# Patient Record
Sex: Female | Born: 1970 | Race: Black or African American | Hispanic: No | Marital: Married | Smoking: Never smoker
Health system: Southern US, Community
[De-identification: ages and names within clinical notes are randomized; demographics above are authoritative.]

## PROBLEM LIST (undated history)

## (undated) DIAGNOSIS — C801 Malignant (primary) neoplasm, unspecified: Secondary | ICD-10-CM

## (undated) DIAGNOSIS — R053 Chronic cough: Secondary | ICD-10-CM

## (undated) DIAGNOSIS — R05 Cough: Secondary | ICD-10-CM

## (undated) DIAGNOSIS — K589 Irritable bowel syndrome without diarrhea: Secondary | ICD-10-CM

## (undated) DIAGNOSIS — Z803 Family history of malignant neoplasm of breast: Secondary | ICD-10-CM

## (undated) DIAGNOSIS — Z01419 Encounter for gynecological examination (general) (routine) without abnormal findings: Secondary | ICD-10-CM

## (undated) DIAGNOSIS — K219 Gastro-esophageal reflux disease without esophagitis: Secondary | ICD-10-CM

## (undated) DIAGNOSIS — E785 Hyperlipidemia, unspecified: Secondary | ICD-10-CM

## (undated) HISTORY — DX: Irritable bowel syndrome, unspecified: K58.9

## (undated) HISTORY — DX: Hyperlipidemia, unspecified: E78.5

## (undated) HISTORY — PX: ROOT CANAL: SHX2363

## (undated) HISTORY — DX: Chronic cough: R05.3

## (undated) HISTORY — DX: Cough: R05

## (undated) HISTORY — DX: Gastro-esophageal reflux disease without esophagitis: K21.9

## (undated) HISTORY — DX: Family history of malignant neoplasm of breast: Z80.3

## (undated) HISTORY — DX: Encounter for gynecological examination (general) (routine) without abnormal findings: Z01.419

---

## 1998-09-10 ENCOUNTER — Emergency Department (HOSPITAL_COMMUNITY): Admission: EM | Admit: 1998-09-10 | Discharge: 1998-09-10 | Payer: Self-pay | Admitting: Emergency Medicine

## 1998-09-10 ENCOUNTER — Encounter: Payer: Self-pay | Admitting: Emergency Medicine

## 1999-09-19 ENCOUNTER — Emergency Department (HOSPITAL_COMMUNITY): Admission: EM | Admit: 1999-09-19 | Discharge: 1999-09-19 | Payer: Self-pay | Admitting: Podiatry

## 1999-09-23 ENCOUNTER — Inpatient Hospital Stay (HOSPITAL_COMMUNITY): Admission: EM | Admit: 1999-09-23 | Discharge: 1999-09-29 | Payer: Self-pay | Admitting: *Deleted

## 1999-09-23 ENCOUNTER — Encounter: Payer: Self-pay | Admitting: Emergency Medicine

## 1999-09-30 ENCOUNTER — Other Ambulatory Visit: Admission: RE | Admit: 1999-09-30 | Discharge: 1999-10-07 | Payer: Self-pay

## 1999-11-04 ENCOUNTER — Other Ambulatory Visit: Admission: RE | Admit: 1999-11-04 | Discharge: 1999-11-04 | Payer: Self-pay | Admitting: Obstetrics and Gynecology

## 2001-02-21 ENCOUNTER — Other Ambulatory Visit: Admission: RE | Admit: 2001-02-21 | Discharge: 2001-02-21 | Payer: Self-pay | Admitting: Obstetrics and Gynecology

## 2002-04-25 ENCOUNTER — Ambulatory Visit (HOSPITAL_COMMUNITY): Admission: RE | Admit: 2002-04-25 | Discharge: 2002-04-25 | Payer: Self-pay

## 2002-05-08 ENCOUNTER — Ambulatory Visit (HOSPITAL_COMMUNITY): Admission: RE | Admit: 2002-05-08 | Discharge: 2002-05-08 | Payer: Self-pay | Admitting: Obstetrics and Gynecology

## 2002-05-08 ENCOUNTER — Encounter: Payer: Self-pay | Admitting: Obstetrics and Gynecology

## 2002-08-28 ENCOUNTER — Encounter: Payer: Self-pay | Admitting: Internal Medicine

## 2003-09-01 ENCOUNTER — Other Ambulatory Visit: Admission: RE | Admit: 2003-09-01 | Discharge: 2003-09-01 | Payer: Self-pay | Admitting: Obstetrics and Gynecology

## 2004-08-09 ENCOUNTER — Ambulatory Visit: Payer: Self-pay | Admitting: Family Medicine

## 2005-01-24 ENCOUNTER — Other Ambulatory Visit: Admission: RE | Admit: 2005-01-24 | Discharge: 2005-01-24 | Payer: Self-pay | Admitting: Obstetrics and Gynecology

## 2005-04-24 ENCOUNTER — Ambulatory Visit: Payer: Self-pay | Admitting: Internal Medicine

## 2005-06-27 ENCOUNTER — Ambulatory Visit: Payer: Self-pay | Admitting: Internal Medicine

## 2007-04-05 ENCOUNTER — Ambulatory Visit: Payer: Self-pay | Admitting: Family Medicine

## 2007-10-04 ENCOUNTER — Encounter: Payer: Self-pay | Admitting: Family Medicine

## 2007-10-14 ENCOUNTER — Ambulatory Visit: Payer: Self-pay | Admitting: Family Medicine

## 2007-10-14 DIAGNOSIS — K589 Irritable bowel syndrome without diarrhea: Secondary | ICD-10-CM | POA: Insufficient documentation

## 2007-10-14 DIAGNOSIS — K219 Gastro-esophageal reflux disease without esophagitis: Secondary | ICD-10-CM | POA: Insufficient documentation

## 2007-10-18 ENCOUNTER — Encounter: Payer: Self-pay | Admitting: Family Medicine

## 2007-10-18 ENCOUNTER — Telehealth: Payer: Self-pay | Admitting: Family Medicine

## 2007-10-22 ENCOUNTER — Telehealth: Payer: Self-pay | Admitting: Family Medicine

## 2007-10-28 ENCOUNTER — Telehealth: Payer: Self-pay | Admitting: Family Medicine

## 2007-12-03 ENCOUNTER — Ambulatory Visit: Payer: Self-pay | Admitting: Family Medicine

## 2007-12-11 ENCOUNTER — Telehealth: Payer: Self-pay | Admitting: Family Medicine

## 2008-01-20 ENCOUNTER — Ambulatory Visit: Payer: Self-pay | Admitting: Family Medicine

## 2008-01-21 ENCOUNTER — Telehealth: Payer: Self-pay | Admitting: Family Medicine

## 2008-01-24 ENCOUNTER — Telehealth: Payer: Self-pay | Admitting: Family Medicine

## 2008-01-27 ENCOUNTER — Telehealth: Payer: Self-pay | Admitting: Family Medicine

## 2008-02-26 ENCOUNTER — Ambulatory Visit: Payer: Self-pay | Admitting: Family Medicine

## 2008-04-16 ENCOUNTER — Telehealth: Payer: Self-pay | Admitting: Internal Medicine

## 2008-04-28 ENCOUNTER — Telehealth: Payer: Self-pay | Admitting: Family Medicine

## 2008-05-06 ENCOUNTER — Telehealth (INDEPENDENT_AMBULATORY_CARE_PROVIDER_SITE_OTHER): Payer: Self-pay | Admitting: *Deleted

## 2008-05-06 ENCOUNTER — Ambulatory Visit: Payer: Self-pay | Admitting: Family Medicine

## 2008-05-29 ENCOUNTER — Telehealth: Payer: Self-pay | Admitting: Family Medicine

## 2008-06-03 ENCOUNTER — Ambulatory Visit: Payer: Self-pay | Admitting: Pulmonary Disease

## 2008-06-04 ENCOUNTER — Telehealth (INDEPENDENT_AMBULATORY_CARE_PROVIDER_SITE_OTHER): Payer: Self-pay | Admitting: *Deleted

## 2008-06-05 ENCOUNTER — Telehealth: Payer: Self-pay | Admitting: Pulmonary Disease

## 2008-06-09 ENCOUNTER — Ambulatory Visit: Payer: Self-pay | Admitting: Pulmonary Disease

## 2008-06-09 ENCOUNTER — Encounter: Payer: Self-pay | Admitting: Pulmonary Disease

## 2008-06-10 ENCOUNTER — Telehealth: Payer: Self-pay | Admitting: Pulmonary Disease

## 2008-06-11 ENCOUNTER — Ambulatory Visit: Payer: Self-pay | Admitting: Cardiology

## 2008-06-16 ENCOUNTER — Telehealth: Payer: Self-pay | Admitting: Pulmonary Disease

## 2008-06-18 ENCOUNTER — Telehealth (INDEPENDENT_AMBULATORY_CARE_PROVIDER_SITE_OTHER): Payer: Self-pay | Admitting: *Deleted

## 2008-06-18 ENCOUNTER — Telehealth: Payer: Self-pay | Admitting: Internal Medicine

## 2008-06-22 ENCOUNTER — Ambulatory Visit: Payer: Self-pay | Admitting: Internal Medicine

## 2008-06-22 ENCOUNTER — Telehealth: Payer: Self-pay | Admitting: Internal Medicine

## 2008-06-25 ENCOUNTER — Telehealth: Payer: Self-pay | Admitting: Internal Medicine

## 2008-06-30 ENCOUNTER — Telehealth (INDEPENDENT_AMBULATORY_CARE_PROVIDER_SITE_OTHER): Payer: Self-pay | Admitting: *Deleted

## 2008-06-30 ENCOUNTER — Telehealth: Payer: Self-pay | Admitting: Family Medicine

## 2008-07-03 ENCOUNTER — Ambulatory Visit: Payer: Self-pay | Admitting: Pulmonary Disease

## 2008-07-24 ENCOUNTER — Telehealth (INDEPENDENT_AMBULATORY_CARE_PROVIDER_SITE_OTHER): Payer: Self-pay | Admitting: *Deleted

## 2008-07-24 ENCOUNTER — Ambulatory Visit: Payer: Self-pay | Admitting: Pulmonary Disease

## 2008-07-24 DIAGNOSIS — J309 Allergic rhinitis, unspecified: Secondary | ICD-10-CM | POA: Insufficient documentation

## 2008-08-05 ENCOUNTER — Telehealth (INDEPENDENT_AMBULATORY_CARE_PROVIDER_SITE_OTHER): Payer: Self-pay | Admitting: *Deleted

## 2008-08-10 ENCOUNTER — Telehealth (INDEPENDENT_AMBULATORY_CARE_PROVIDER_SITE_OTHER): Payer: Self-pay | Admitting: *Deleted

## 2008-08-12 ENCOUNTER — Telehealth: Payer: Self-pay | Admitting: Pulmonary Disease

## 2008-08-24 ENCOUNTER — Telehealth: Payer: Self-pay | Admitting: Internal Medicine

## 2008-09-09 ENCOUNTER — Telehealth (INDEPENDENT_AMBULATORY_CARE_PROVIDER_SITE_OTHER): Payer: Self-pay | Admitting: *Deleted

## 2008-09-14 ENCOUNTER — Ambulatory Visit: Payer: Self-pay | Admitting: Internal Medicine

## 2008-09-14 LAB — CONVERTED CEMR LAB
Basophils Relative: 0.2 % (ref 0.0–3.0)
HCT: 37.3 % (ref 36.0–46.0)
MCV: 88.7 fL (ref 78.0–100.0)
Platelets: 273 10*3/uL (ref 150–400)
RDW: 12.1 % (ref 11.5–14.6)

## 2008-10-06 ENCOUNTER — Ambulatory Visit: Payer: Self-pay | Admitting: Internal Medicine

## 2008-10-16 ENCOUNTER — Telehealth: Payer: Self-pay | Admitting: Internal Medicine

## 2008-10-28 ENCOUNTER — Ambulatory Visit: Payer: Self-pay | Admitting: Internal Medicine

## 2008-11-10 ENCOUNTER — Ambulatory Visit: Payer: Self-pay | Admitting: Internal Medicine

## 2008-12-03 ENCOUNTER — Ambulatory Visit: Payer: Self-pay | Admitting: Internal Medicine

## 2008-12-06 ENCOUNTER — Encounter: Payer: Self-pay | Admitting: Internal Medicine

## 2008-12-08 ENCOUNTER — Ambulatory Visit: Payer: Self-pay | Admitting: Internal Medicine

## 2008-12-17 ENCOUNTER — Telehealth: Payer: Self-pay | Admitting: Internal Medicine

## 2009-01-18 ENCOUNTER — Telehealth: Payer: Self-pay | Admitting: Internal Medicine

## 2009-02-24 ENCOUNTER — Telehealth: Payer: Self-pay | Admitting: Internal Medicine

## 2009-03-12 ENCOUNTER — Telehealth (INDEPENDENT_AMBULATORY_CARE_PROVIDER_SITE_OTHER): Payer: Self-pay | Admitting: *Deleted

## 2009-07-20 ENCOUNTER — Telehealth: Payer: Self-pay | Admitting: Internal Medicine

## 2010-05-17 ENCOUNTER — Ambulatory Visit: Payer: Self-pay | Admitting: Cardiology

## 2010-05-17 ENCOUNTER — Inpatient Hospital Stay (HOSPITAL_COMMUNITY): Admission: EM | Admit: 2010-05-17 | Discharge: 2010-05-19 | Payer: Self-pay | Admitting: Emergency Medicine

## 2010-05-17 ENCOUNTER — Ambulatory Visit: Payer: Self-pay | Admitting: Internal Medicine

## 2010-05-18 ENCOUNTER — Ambulatory Visit: Payer: Self-pay | Admitting: Vascular Surgery

## 2010-05-18 ENCOUNTER — Encounter: Payer: Self-pay | Admitting: Internal Medicine

## 2010-10-27 NOTE — Miscellaneous (Signed)
Summary: Injection Record / [facility]    Injection Record / [name]    Imported By: Lennie Odor 05/27/2010 14:40:30  _____________________________________________________________________  External Attachment:    Type:   Image     Comment:   External Document

## 2010-10-27 NOTE — Miscellaneous (Signed)
Summary: Injection Orders / [facility]    Injection Orders / [facility]    Imported By: Lennie Odor 02/22/2010 16:16:09  _____________________________________________________________________  External Attachment:    Type:   Image     Comment:   External Document

## 2010-11-07 ENCOUNTER — Other Ambulatory Visit: Payer: Self-pay | Admitting: Obstetrics and Gynecology

## 2010-11-24 ENCOUNTER — Other Ambulatory Visit: Payer: Self-pay | Admitting: Internal Medicine

## 2010-12-09 LAB — DIFFERENTIAL
Basophils Absolute: 0 10*3/uL (ref 0.0–0.1)
Basophils Absolute: 0 10*3/uL (ref 0.0–0.1)
Basophils Relative: 0 % (ref 0–1)
Basophils Relative: 0 % (ref 0–1)
Eosinophils Absolute: 0.1 10*3/uL (ref 0.0–0.7)
Eosinophils Relative: 1 % (ref 0–5)
Lymphocytes Relative: 24 % (ref 12–46)
Lymphocytes Relative: 34 % (ref 12–46)
Lymphs Abs: 1.8 10*3/uL (ref 0.7–4.0)
Lymphs Abs: 2.3 10*3/uL (ref 0.7–4.0)
Monocytes Relative: 4 % (ref 3–12)
Monocytes Relative: 6 % (ref 3–12)
Neutro Abs: 3.4 10*3/uL (ref 1.7–7.7)
Neutro Abs: 5.1 10*3/uL (ref 1.7–7.7)
Neutrophils Relative %: 57 % (ref 43–77)
Neutrophils Relative %: 66 % (ref 43–77)
Neutrophils Relative %: 70 % (ref 43–77)

## 2010-12-09 LAB — CBC
HCT: 38.4 % (ref 36.0–46.0)
HCT: 39.2 % (ref 36.0–46.0)
HCT: 40.9 % (ref 36.0–46.0)
HCT: 41.3 % (ref 36.0–46.0)
Hemoglobin: 12.7 g/dL (ref 12.0–15.0)
Hemoglobin: 13.9 g/dL (ref 12.0–15.0)
Hemoglobin: 14.1 g/dL (ref 12.0–15.0)
MCH: 28.9 pg (ref 26.0–34.0)
MCH: 29.2 pg (ref 26.0–34.0)
MCHC: 33.1 g/dL (ref 30.0–36.0)
MCHC: 34 g/dL (ref 30.0–36.0)
MCHC: 34.1 g/dL (ref 30.0–36.0)
MCV: 86.7 fL (ref 78.0–100.0)
Platelets: 290 10*3/uL (ref 150–400)
RBC: 4.35 MIL/uL (ref 3.87–5.11)
RBC: 4.46 MIL/uL (ref 3.87–5.11)
RBC: 4.72 MIL/uL (ref 3.87–5.11)
RBC: 4.72 MIL/uL (ref 3.87–5.11)
RDW: 12.4 % (ref 11.5–15.5)
RDW: 12.5 % (ref 11.5–15.5)
RDW: 12.5 % (ref 11.5–15.5)
WBC: 7.8 10*3/uL (ref 4.0–10.5)
WBC: 7.8 10*3/uL (ref 4.0–10.5)

## 2010-12-09 LAB — COMPREHENSIVE METABOLIC PANEL
ALT: 15 U/L (ref 0–35)
Albumin: 4.2 g/dL (ref 3.5–5.2)
Albumin: 4.3 g/dL (ref 3.5–5.2)
BUN: 7 mg/dL (ref 6–23)
CO2: 26 mEq/L (ref 19–32)
Calcium: 9.1 mg/dL (ref 8.4–10.5)
Calcium: 9.1 mg/dL (ref 8.4–10.5)
Chloride: 103 mEq/L (ref 96–112)
Chloride: 104 mEq/L (ref 96–112)
Creatinine, Ser: 0.68 mg/dL (ref 0.4–1.2)
Creatinine, Ser: 0.72 mg/dL (ref 0.4–1.2)
GFR calc Af Amer: >60 mL/min (ref >60)
GFR calc Af Amer: >60 mL/min (ref >60)
GFR calc non Af Amer: >60 mL/min (ref >60)
GFR calc non Af Amer: >60 mL/min (ref >60)
Glucose, Bld: 95 mg/dL (ref 70–99)
Glucose, Bld: 97 mg/dL (ref 70–99)
Potassium: 4.2 mEq/L (ref 3.5–5.1)
Sodium: 137 mEq/L (ref 135–145)
Total Protein: 7.3 g/dL (ref 6.0–8.3)
Total Protein: 7.9 g/dL (ref 6.0–8.3)

## 2010-12-09 LAB — BASIC METABOLIC PANEL
BUN: 9 mg/dL (ref 6–23)
CO2: 26 mEq/L (ref 19–32)
Calcium: 8.9 mg/dL (ref 8.4–10.5)
Calcium: 8.9 mg/dL (ref 8.4–10.5)
Creatinine, Ser: 0.71 mg/dL (ref 0.4–1.2)
GFR calc Af Amer: >60 mL/min (ref >60)
GFR calc non Af Amer: >60 mL/min (ref >60)
GFR calc non Af Amer: >60 mL/min (ref >60)
Potassium: 4.2 mEq/L (ref 3.5–5.1)
Sodium: 136 mEq/L (ref 135–145)

## 2010-12-09 LAB — CLOSTRIDIUM DIFFICILE EIA: C difficile Toxins A+B, EIA: NEGATIVE

## 2010-12-09 LAB — CK TOTAL AND CKMB (NOT AT ARMC)
Relative Index: INVALID (ref 0.0–2.5)
Relative Index: INVALID (ref 0.0–2.5)
Total CK: 93 U/L (ref 7–177)

## 2010-12-09 LAB — URINALYSIS, ROUTINE W REFLEX MICROSCOPIC
Bilirubin Urine: NEGATIVE
Glucose, UA: NEGATIVE mg/dL
Hgb urine dipstick: NEGATIVE
Ketones, ur: NEGATIVE mg/dL
Protein, ur: NEGATIVE mg/dL
pH: 5.5 (ref 5.0–8.0)

## 2010-12-09 LAB — URINE DRUGS OF ABUSE SCREEN W ALC, ROUTINE (REF LAB)
Benzodiazepines.: NEGATIVE
Cocaine Metabolites: NEGATIVE
Creatinine,U: 250.4 mg/dL
Methadone: NEGATIVE
Phencyclidine (PCP): NEGATIVE

## 2010-12-09 LAB — APTT: aPTT: 29 seconds (ref 24–37)

## 2010-12-09 LAB — HEMOGLOBIN A1C: Mean Plasma Glucose: 108 mg/dL (ref <117)

## 2010-12-09 LAB — LIPID PANEL: Triglycerides: 122 mg/dL (ref <150)

## 2010-12-09 LAB — PROTIME-INR: Prothrombin Time: 13.4 seconds (ref 11.6–15.2)

## 2010-12-16 ENCOUNTER — Encounter: Payer: Self-pay | Admitting: Family Medicine

## 2010-12-21 ENCOUNTER — Ambulatory Visit: Payer: Self-pay | Admitting: Family Medicine

## 2010-12-23 ENCOUNTER — Encounter: Payer: Self-pay | Admitting: Family Medicine

## 2010-12-23 ENCOUNTER — Ambulatory Visit (INDEPENDENT_AMBULATORY_CARE_PROVIDER_SITE_OTHER): Payer: BC Managed Care – PPO | Admitting: Family Medicine

## 2010-12-23 VITALS — BP 124/82 | HR 86 | Wt 150.0 lb

## 2010-12-23 DIAGNOSIS — G459 Transient cerebral ischemic attack, unspecified: Secondary | ICD-10-CM

## 2010-12-23 DIAGNOSIS — E785 Hyperlipidemia, unspecified: Secondary | ICD-10-CM

## 2010-12-23 LAB — CBC WITH DIFFERENTIAL/PLATELET
Basophils Absolute: 0 10*3/uL (ref 0.0–0.1)
Eosinophils Absolute: 0.2 10*3/uL (ref 0.0–0.7)
HCT: 39.7 % (ref 36.0–46.0)
Hemoglobin: 13.4 g/dL (ref 12.0–15.0)
Lymphocytes Relative: 36.9 % (ref 12.0–46.0)
Lymphs Abs: 2 10*3/uL (ref 0.7–4.0)
MCHC: 33.8 g/dL (ref 30.0–36.0)
Monocytes Absolute: 0.2 10*3/uL (ref 0.1–1.0)
Neutro Abs: 3.1 10*3/uL (ref 1.4–7.7)
RDW: 13.1 % (ref 11.5–14.6)

## 2010-12-23 LAB — BASIC METABOLIC PANEL
CO2: 28 mEq/L (ref 19–32)
GFR: 109.37 mL/min (ref >60.00)
Glucose, Bld: 79 mg/dL (ref 70–99)
Potassium: 4.3 mEq/L (ref 3.5–5.1)
Sodium: 139 mEq/L (ref 135–145)

## 2010-12-23 LAB — HEPATIC FUNCTION PANEL
AST: 11 U/L (ref 0–37)
Albumin: 4.5 g/dL (ref 3.5–5.2)
Alkaline Phosphatase: 63 U/L (ref 39–117)

## 2010-12-23 LAB — LIPID PANEL: VLDL: 12.8 mg/dL (ref 0.0–40.0)

## 2010-12-23 NOTE — Progress Notes (Signed)
  Subjective:    Patient ID: Brittany Rodgers, female    DOB: 1971-03-15, 40 y.o.   MRN: [id number]  HPI Here to follow up on hyperlipidemia after she was started on Zocor last August. At that time her LDL was 161. This was found when she was hospitalized for a brief episode of weakness. It was unclear if this was from a TIA or not, but no etiology was found. At any rate, she has felt fine ever since. She has changed her diet since then.   Review of Systems  Constitutional: Negative.   Respiratory: Negative.   Cardiovascular: Negative.   Neurological: Negative.        Objective:   Physical Exam  Constitutional: She is oriented to person, place, and time. She appears well-developed and well-nourished.  Cardiovascular: Normal rate, regular rhythm, normal heart sounds and intact distal pulses.   Pulmonary/Chest: Effort normal and breath sounds normal.  Neurological: She is alert and oriented to person, place, and time. No cranial nerve deficit. She exhibits normal muscle tone. Coordination normal.          Assessment & Plan:  Get fasting labs today.

## 2010-12-28 ENCOUNTER — Telehealth: Payer: Self-pay

## 2010-12-28 NOTE — Telephone Encounter (Signed)
Message copied by Madison Hickman on Wed Dec 28, 2010  8:01 AM ------      Message from: Dwaine Deter      Created: Tue Dec 27, 2010  8:42 AM       Normal except mildly high chol. Watch the diet

## 2010-12-28 NOTE — Telephone Encounter (Signed)
Tried several times unable to reach via phone at home and at work.  Letter mailed with the results.

## 2011-02-06 ENCOUNTER — Other Ambulatory Visit: Payer: Self-pay | Admitting: Internal Medicine

## 2011-02-06 ENCOUNTER — Encounter: Payer: Self-pay | Admitting: Family Medicine

## 2011-02-06 ENCOUNTER — Ambulatory Visit (INDEPENDENT_AMBULATORY_CARE_PROVIDER_SITE_OTHER): Payer: BC Managed Care – PPO | Admitting: Family Medicine

## 2011-02-06 DIAGNOSIS — M545 Low back pain: Secondary | ICD-10-CM

## 2011-02-06 DIAGNOSIS — R109 Unspecified abdominal pain: Secondary | ICD-10-CM

## 2011-02-06 LAB — HEPATIC FUNCTION PANEL
Albumin: 4.1 g/dL (ref 3.5–5.2)
Total Bilirubin: 0.5 mg/dL (ref 0.3–1.2)

## 2011-02-06 LAB — BASIC METABOLIC PANEL
BUN: 12 mg/dL (ref 6–23)
Creatinine, Ser: 0.7 mg/dL (ref 0.4–1.2)
GFR: 120.35 mL/min (ref >60.00)

## 2011-02-06 LAB — CBC WITH DIFFERENTIAL/PLATELET
Basophils Relative: 0.3 % (ref 0.0–3.0)
Eosinophils Relative: 4.1 % (ref 0.0–5.0)
HCT: 38.7 % (ref 36.0–46.0)
Lymphs Abs: 1.8 10*3/uL (ref 0.7–4.0)
MCV: 89.6 fl (ref 78.0–100.0)
Monocytes Absolute: 0.2 10*3/uL (ref 0.1–1.0)
Monocytes Relative: 3.3 % (ref 3.0–12.0)
Neutrophils Relative %: 63.4 % (ref 43.0–77.0)
RBC: 4.31 Mil/uL (ref 3.87–5.11)
WBC: 6.3 10*3/uL (ref 4.5–10.5)

## 2011-02-06 LAB — POCT URINALYSIS DIPSTICK
Nitrite, UA: NEGATIVE
Protein, UA: NEGATIVE
Urobilinogen, UA: 0.2
pH, UA: 6.5

## 2011-02-06 LAB — TSH: TSH: 1.98 u[IU]/mL (ref 0.35–5.50)

## 2011-02-06 LAB — AMYLASE: Amylase: 94 U/L (ref 27–131)

## 2011-02-06 MED ORDER — TRAMADOL HCL 50 MG PO TABS: 50.0000 mg | ORAL_TABLET | Freq: Four times a day (QID) | ORAL | Status: AC | PRN

## 2011-02-06 MED ORDER — CELECOXIB 200 MG PO CAPS: 200.0000 mg | ORAL_CAPSULE | Freq: Every day | ORAL | Status: AC

## 2011-02-06 NOTE — Progress Notes (Signed)
  Subjective:    Patient ID: Brittany Rodgers, female    DOB: 29-Jun-1971, 40 y.o.   MRN: [id number]  HPI Here at the request of Dr. Sharolyn Douglas to address some chronic pains she has had in the right lower back, the right flank, and the right leg since an accident at work on 12-29-09. 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 She felt a pain in the right side and right lower back which did not improve. She filed a Workers Comp claim and was directed to see Dr. Noel Gerold. He did some scans on her back and felt it was not a surgical problem, and he referred her for PT. She did this a few weeks and improved, but did not return to baseline. She worked light duty for a while and then returned to full duty. Then the pain worsened again and she has radiations of pain into the right thigh and calf intermittently. She decribes weakness in the leg and says she limped for awhile early this year. No numbness complaints. She then was sent by Dr. Noel Gerold  For more PT in March, again with partial results. Because her flank pain persists, he asked [name] to evaluate for other medical issues. She had a normal pelvic exam and Pap smear per Dr. Malva Limes in February. She denies any urinary complaints or bowel complaints. No nausea or fever. Appetite is good. Her LMP was 3 weeks ago.   Review of Systems  Respiratory: Negative.   Gastrointestinal: Positive for abdominal pain. Negative for nausea, diarrhea, constipation, blood in stool, abdominal distention, anal bleeding and rectal pain.  Genitourinary: Negative.   Musculoskeletal: Positive for back pain.  Neurological: Positive for weakness.       Objective:   Physical Exam  Constitutional:       In no distress. Her gait is normal with no limp. She gets on the exam table easily.   Cardiovascular: Normal rate, regular rhythm, normal heart sounds and intact distal pulses.     Pulmonary/Chest: Effort normal and breath sounds normal.  Abdominal: Soft. Bowel sounds are normal. She exhibits no distension and no mass. There is no tenderness. There is no rebound and no guarding.  Musculoskeletal:       Mildly tender in the right middle back, not tender at all in the lower back. Full ROM, no spasm is noted, negative SLR.   Neurological: She exhibits normal muscle tone. Coordination normal.          Assessment & Plan:

## 2011-02-07 ENCOUNTER — Telehealth: Payer: Self-pay | Admitting: *Deleted

## 2011-02-07 ENCOUNTER — Other Ambulatory Visit: Payer: Self-pay | Admitting: *Deleted

## 2011-02-07 DIAGNOSIS — R109 Unspecified abdominal pain: Secondary | ICD-10-CM

## 2011-02-07 DIAGNOSIS — R10A1 Flank pain, right side: Secondary | ICD-10-CM

## 2011-02-07 LAB — URINE CULTURE

## 2011-02-07 NOTE — Telephone Encounter (Signed)
Message copied by Burnard Leigh on Tue Feb 07, 2011 11:20 AM ------      Message from: Dwaine Deter      Created: Tue Feb 07, 2011  8:44 AM       normal

## 2011-02-07 NOTE — Telephone Encounter (Signed)
Attempted to contact Pt for lab results but contact phone # gives msg stating that "voice mail has not yet been set up". Tried to get in touch w/listed contact, but their phone # is "d/c or no longer in service".

## 2011-02-07 NOTE — Telephone Encounter (Signed)
Please mail him the results  

## 2011-02-08 NOTE — Telephone Encounter (Signed)
Copy mailed to home address 

## 2011-03-17 ENCOUNTER — Ambulatory Visit (INDEPENDENT_AMBULATORY_CARE_PROVIDER_SITE_OTHER): Payer: BC Managed Care – PPO | Admitting: Family Medicine

## 2011-03-17 ENCOUNTER — Encounter: Payer: Self-pay | Admitting: Family Medicine

## 2011-03-17 VITALS — BP 150/84 | Temp 99.1°F | Wt 152.0 lb

## 2011-03-17 DIAGNOSIS — R030 Elevated blood-pressure reading, without diagnosis of hypertension: Secondary | ICD-10-CM

## 2011-03-17 DIAGNOSIS — R509 Fever, unspecified: Secondary | ICD-10-CM

## 2011-03-17 NOTE — Progress Notes (Signed)
  Subjective:    Patient ID: Brittany Rodgers, female    DOB: 1971-05-28, 40 y.o.   MRN: [id number]  HPI Here for one day of a mild HA and some lightheadedness. No fever that she knew of. No ST or cough or chest pain or SOB. She felt bad at work and went to a local pharmacy to check her BP, and it was 160/96 there. She was told to come see [name]. She has no hx of HTN, and in fact her BP was normal here just last month. She also had normal labs that day, including a BMET and TSH.    Review of Systems  Constitutional: Negative.   Eyes: Negative.   Respiratory: Negative.   Cardiovascular: Negative.   Neurological: Positive for dizziness and headaches.       Objective:   Physical Exam  Constitutional: She is oriented to person, place, and time. She appears well-developed and well-nourished. No distress.  HENT:  Right Ear: External ear normal.  Left Ear: External ear normal.  Nose: Nose normal.  Mouth/Throat: Oropharynx is clear and moist. No oropharyngeal exudate.  Eyes: Conjunctivae are normal. Pupils are equal, round, and reactive to light.  Neck: No thyromegaly present.  Cardiovascular: Normal rate, regular rhythm, normal heart sounds and intact distal pulses.   Pulmonary/Chest: Effort normal and breath sounds normal.  Musculoskeletal: She exhibits edema.  Lymphadenopathy:    She has no cervical adenopathy.  Neurological: She is alert and oriented to person, place, and time. No cranial nerve deficit.          Assessment & Plan:  Her BP is elevated today, but she may be coming down with an infection of some sort. She does have a slight fever today. I advised her to rest, drink water, and to avoid sodium. 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 If she gets sick, see [name] next week. If not, see [name] in 3-4 weeks to recheck the BP

## 2011-06-07 ENCOUNTER — Ambulatory Visit: Payer: BC Managed Care – PPO | Admitting: Family Medicine

## 2011-08-22 ENCOUNTER — Ambulatory Visit (INDEPENDENT_AMBULATORY_CARE_PROVIDER_SITE_OTHER): Payer: BC Managed Care – PPO | Admitting: Family Medicine

## 2011-08-22 ENCOUNTER — Encounter: Payer: Self-pay | Admitting: Family Medicine

## 2011-08-22 VITALS — BP 124/80 | HR 97 | Temp 98.6°F | Wt 154.0 lb

## 2011-08-22 DIAGNOSIS — M6281 Muscle weakness (generalized): Secondary | ICD-10-CM

## 2011-08-22 DIAGNOSIS — R29898 Other symptoms and signs involving the musculoskeletal system: Secondary | ICD-10-CM

## 2011-08-22 DIAGNOSIS — E785 Hyperlipidemia, unspecified: Secondary | ICD-10-CM

## 2011-08-22 DIAGNOSIS — M545 Low back pain, unspecified: Secondary | ICD-10-CM

## 2011-08-22 DIAGNOSIS — I10 Essential (primary) hypertension: Secondary | ICD-10-CM

## 2011-08-22 NOTE — Progress Notes (Signed)
  Subjective:    Patient ID: Brittany Rodgers, female    DOB: [birth year], 40 y.o.   MRN: [id number]  HPI Here asking about labs for her cholesterol and to check her BP. She is still having low back pain and weakness down the right leg since her workers comp accident in April 2011. She is not sure if the leg problem is related to the accident or not. Of course workers comp is pushing her to settle for a figure. Otherwise  she feels fine.   Review of Systems  Respiratory: Negative.   Cardiovascular: Negative.   Musculoskeletal: Positive for back pain.  Neurological: Positive for weakness.       Objective:   Physical Exam  Constitutional: She appears well-developed and well-nourished.  Cardiovascular: Normal rate, regular rhythm, normal heart sounds and intact distal pulses.   Pulmonary/Chest: Effort normal and breath sounds normal.          Assessment & Plan:  Set up fasting labs soon. Her HTN is stable. I suggested she ask her lawyer about seeing a Neurologist to answer the question of what is causing the leg weakness.

## 2011-09-04 ENCOUNTER — Other Ambulatory Visit (INDEPENDENT_AMBULATORY_CARE_PROVIDER_SITE_OTHER): Payer: BC Managed Care – PPO

## 2011-09-04 DIAGNOSIS — E785 Hyperlipidemia, unspecified: Secondary | ICD-10-CM

## 2011-09-04 LAB — HEPATIC FUNCTION PANEL
ALT: 15 U/L (ref 0–35)
AST: 11 U/L (ref 0–37)
Alkaline Phosphatase: 64 U/L (ref 39–117)
Bilirubin, Direct: 0 mg/dL (ref 0.0–0.3)
Total Bilirubin: 0.7 mg/dL (ref 0.3–1.2)

## 2011-09-04 LAB — LIPID PANEL
HDL: 55.1 mg/dL (ref >39.00)
Total CHOL/HDL Ratio: 3

## 2011-09-07 NOTE — Progress Notes (Signed)
Quick Note:  Left voice message ______ 

## 2012-01-16 ENCOUNTER — Other Ambulatory Visit: Payer: Self-pay | Admitting: Family Medicine

## 2012-04-16 ENCOUNTER — Other Ambulatory Visit (INDEPENDENT_AMBULATORY_CARE_PROVIDER_SITE_OTHER): Payer: BC Managed Care – PPO

## 2012-04-16 DIAGNOSIS — Z0000 Encounter for general adult medical examination without abnormal findings: Secondary | ICD-10-CM

## 2012-04-16 LAB — POCT URINALYSIS DIPSTICK
Glucose, UA: NEGATIVE
Nitrite, UA: NEGATIVE
Protein, UA: NEGATIVE
Spec Grav, UA: 1.02
Urobilinogen, UA: 2

## 2012-04-16 LAB — HEPATIC FUNCTION PANEL
AST: 6 U/L (ref 0–37)
Albumin: 4.2 g/dL (ref 3.5–5.2)
Alkaline Phosphatase: 61 U/L (ref 39–117)
Total Bilirubin: 0.6 mg/dL (ref 0.3–1.2)

## 2012-04-16 LAB — BASIC METABOLIC PANEL
BUN: 11 mg/dL (ref 6–23)
CO2: 28 mEq/L (ref 19–32)
Calcium: 8.9 mg/dL (ref 8.4–10.5)
Creatinine, Ser: 0.7 mg/dL (ref 0.4–1.2)
GFR: 112.12 mL/min (ref >60.00)
Glucose, Bld: 86 mg/dL (ref 70–99)
Sodium: 137 mEq/L (ref 135–145)

## 2012-04-16 LAB — CBC WITH DIFFERENTIAL/PLATELET
Basophils Absolute: 0 10*3/uL (ref 0.0–0.1)
Eosinophils Absolute: 0.1 10*3/uL (ref 0.0–0.7)
Lymphocytes Relative: 31.9 % (ref 12.0–46.0)
MCHC: 33.2 g/dL (ref 30.0–36.0)
Monocytes Relative: 3.9 % (ref 3.0–12.0)
Neutro Abs: 3 10*3/uL (ref 1.4–7.7)
Neutrophils Relative %: 61.5 % (ref 43.0–77.0)
Platelets: 335 10*3/uL (ref 150.0–400.0)
RDW: 13.2 % (ref 11.5–14.6)

## 2012-04-16 LAB — LIPID PANEL
HDL: 50.9 mg/dL (ref >39.00)
Total CHOL/HDL Ratio: 4
VLDL: 22.2 mg/dL (ref 0.0–40.0)

## 2012-04-16 LAB — TSH: TSH: 0.84 u[IU]/mL (ref 0.35–5.50)

## 2012-04-17 NOTE — Progress Notes (Signed)
Quick Note:  I left voice message with results. ______ 

## 2012-04-23 ENCOUNTER — Encounter: Payer: Self-pay | Admitting: Family Medicine

## 2012-04-23 ENCOUNTER — Other Ambulatory Visit: Payer: Self-pay | Admitting: Family Medicine

## 2012-04-23 ENCOUNTER — Ambulatory Visit (INDEPENDENT_AMBULATORY_CARE_PROVIDER_SITE_OTHER): Payer: BC Managed Care – PPO | Admitting: Family Medicine

## 2012-04-23 VITALS — BP 116/80 | HR 84 | Temp 99.0°F | Ht 62.0 in | Wt 151.0 lb

## 2012-04-23 DIAGNOSIS — E785 Hyperlipidemia, unspecified: Secondary | ICD-10-CM

## 2012-04-23 DIAGNOSIS — Z0000 Encounter for general adult medical examination without abnormal findings: Secondary | ICD-10-CM

## 2012-04-23 MED ORDER — CYCLOBENZAPRINE HCL 10 MG PO TABS: 10.0000 mg | ORAL_TABLET | Freq: Three times a day (TID) | ORAL | Status: DC | PRN

## 2012-04-23 MED ORDER — OMEPRAZOLE-SODIUM BICARBONATE 20-1100 MG PO CAPS: 1.0000 | ORAL_CAPSULE | Freq: Every day | ORAL | Status: DC

## 2012-04-23 MED ORDER — POLYETHYLENE GLYCOL 3350 17 G PO PACK: 17.0000 g | PACK | ORAL | Status: DC | PRN

## 2012-04-23 NOTE — Progress Notes (Signed)
  Subjective:    Patient ID: Brittany Rodgers, female    DOB: 1970-12-18, 41 y.o.   MRN: [id number]  HPI 41 yr old female for a cpx. She feels fine and has no complaints. However she has taken herself off Zocor to see what she can do with diet alone. She has a lot of questions about reading labels on foods when they talk about cholesterol content or fat content.   Review of Systems  Constitutional: Negative.   HENT: Negative.   Eyes: Negative.   Respiratory: Negative.   Cardiovascular: Negative.   Gastrointestinal: Negative.   Genitourinary: Negative for dysuria, urgency, frequency, hematuria, flank pain, decreased urine volume, enuresis, difficulty urinating, pelvic pain and dyspareunia.  Musculoskeletal: Negative.   Skin: Negative.   Neurological: Negative.   Hematological: Negative.   Psychiatric/Behavioral: Negative.        Objective:   Physical Exam  Constitutional: She is oriented to person, place, and time. She appears well-developed and well-nourished. No distress.  HENT:  Head: Normocephalic and atraumatic.  Right Ear: External ear normal.  Left Ear: External ear normal.  Nose: Nose normal.  Mouth/Throat: Oropharynx is clear and moist. No oropharyngeal exudate.  Eyes: Conjunctivae and EOM are normal. Pupils are equal, round, and reactive to light. No scleral icterus.  Neck: Normal range of motion. Neck supple. No JVD present. No thyromegaly present.  Cardiovascular: Normal rate, regular rhythm, normal heart sounds and intact distal pulses.  Exam reveals no gallop and no friction rub.   No murmur heard. Pulmonary/Chest: Effort normal and breath sounds normal. No respiratory distress. She has no wheezes. She has no rales. She exhibits no tenderness.  Abdominal: Soft. Bowel sounds are normal. She exhibits no distension and no mass. There is no tenderness. There is no rebound and no guarding.  Musculoskeletal: Normal range of motion. She exhibits no edema and no tenderness.    Lymphadenopathy:    She has no cervical adenopathy.  Neurological: She is alert and oriented to person, place, and time. She has normal reflexes. No cranial nerve deficit. She exhibits normal muscle tone. Coordination normal.  Skin: Skin is warm and dry. No rash noted. No erythema.  Psychiatric: She has a normal mood and affect. Her behavior is normal. Judgment and thought content normal.          Assessment & Plan:  Well exam. We will refer her to Nutrition to learn how to eat better. Stay off Zocor for now. Recheck lipids in 6 months

## 2012-05-14 ENCOUNTER — Ambulatory Visit: Payer: BC Managed Care – PPO | Admitting: *Deleted

## 2012-05-20 ENCOUNTER — Encounter: Payer: BC Managed Care – PPO | Attending: Family Medicine | Admitting: *Deleted

## 2012-05-20 ENCOUNTER — Encounter: Payer: Self-pay | Admitting: *Deleted

## 2012-05-20 VITALS — Ht 62.0 in | Wt 149.4 lb

## 2012-05-20 DIAGNOSIS — Z713 Dietary counseling and surveillance: Secondary | ICD-10-CM | POA: Insufficient documentation

## 2012-05-20 DIAGNOSIS — E785 Hyperlipidemia, unspecified: Secondary | ICD-10-CM | POA: Insufficient documentation

## 2012-05-20 NOTE — Progress Notes (Signed)
  Medical Nutrition Therapy:  Appt start time: 1415 end time:  1515.   Assessment:  Primary concerns today: hyperlipidemia.   MEDICATIONS: see list   DIETARY INTAKE:  Usual eating pattern includes 3 meals and 1 snacks per day.  Everyday foods include starches, proteins, fruits, some vegetables.  Avoided foods include forms of pork, dairy.    24-hr recall:  B ( AM): oatmeal with fruit and cinnamon; cinnamon or blueberry bagel with sausage; toast with sausage and egg  Snk ( AM): none  L ( PM): cold cut sandwich with mayo and mustard on whitewheat or whole wheat bread; leftovers from dinner Snk ( PM): chips or fruit D ( PM): beef, [place], chicken, fish with 2 sides: vegetable Snk ( PM): popsicle Beverages: water or juice  Usual physical activity: walking at work- nothing outside of AD[employer]; used to walk around the block but not lately.  Back stretches  Estimated energy needs: 1400 calories 158 g carbohydrates 105 g protein 39 g fat  Progress Towards Goal(s):  In progress.   Nutritional Diagnosis:  NI-5.6.2 Excessive fat intake As related to consumption of food high in saturated fats.  As evidenced by total cholesterol 223 mg/dL.    Intervention:  Nutrition counseling provided. Discussed saturated, unsaturated, and trans fats.  Encouraged limiting saturated fats, avoiding trans fats, and increasing unsaturated.  Encouraged limiting dietary cholesterol, especially in terms of eggs - encouraged egg substitute.  Encouraged more omega-3 fatty acids and fiber from whole grains, fruits, vegetables, and beans.  Encouraged more physical activity   Handouts given during visit include:  Tips for lowering cholesterol  Reading food labels  Monitoring/Evaluation:  Dietary intake, exercise,  and body weight prn.

## 2012-05-20 NOTE — Patient Instructions (Signed)
Limit saturated and trans fats.  Choose more unsaturated fats Increase omega-3 Increase fiber Increase physical activity

## 2013-01-21 ENCOUNTER — Other Ambulatory Visit: Payer: Self-pay | Admitting: Obstetrics and Gynecology

## 2013-01-27 ENCOUNTER — Encounter: Payer: Self-pay | Admitting: Family Medicine

## 2013-01-27 ENCOUNTER — Ambulatory Visit: Payer: BC Managed Care – PPO | Admitting: Family Medicine

## 2013-01-27 ENCOUNTER — Ambulatory Visit (INDEPENDENT_AMBULATORY_CARE_PROVIDER_SITE_OTHER): Payer: BC Managed Care – PPO | Admitting: Family Medicine

## 2013-01-27 VITALS — BP 114/78 | HR 74 | Temp 98.3°F | Wt 155.0 lb

## 2013-01-27 DIAGNOSIS — M25559 Pain in unspecified hip: Secondary | ICD-10-CM

## 2013-01-27 DIAGNOSIS — E785 Hyperlipidemia, unspecified: Secondary | ICD-10-CM

## 2013-01-27 DIAGNOSIS — G8929 Other chronic pain: Secondary | ICD-10-CM

## 2013-01-27 MED ORDER — CYCLOBENZAPRINE HCL 10 MG PO TABS: 10.0000 mg | ORAL_TABLET | Freq: Three times a day (TID) | ORAL | Status: DC | PRN

## 2013-01-27 NOTE — Progress Notes (Signed)
  Subjective:    Patient ID: Brittany Rodgers, female    DOB: [birth year], 42 y.o.   MRN: [id number]  HPI Here to recheck her lipids and to ask about chronic pain in the right lower back and right hip. She has had this pain for about 3 years, starting after a Workers Comp injury. She has settled the Comp case, and now she wants to deal with this on her regular insurance. She still has daily stiffness and pain, and she uses Advil and Flexeril prn. She has seen Dr. Noel Gerold and [facility] several times about this, and they could not seem to help. She now wants another opinion. She is watching her diet and has met with the Nutritionist several times.    Review of Systems  Constitutional: Negative.   Musculoskeletal: Positive for back pain and arthralgias.       Objective:   Physical Exam  Constitutional: She appears well-developed and well-nourished.  Musculoskeletal: Normal range of motion. She exhibits no edema and no tenderness.          Assessment & Plan:  For the pain, we will refer to Physical Med Rehab for a different perspective. Get fasting lipids today

## 2013-01-31 NOTE — Progress Notes (Signed)
Quick Note:  Attempted to call pt; vm full ______

## 2013-02-03 NOTE — Progress Notes (Signed)
Quick Note:  I tried to reach pt by phone, no answer. I put a copy of results in mail. ______ 

## 2013-02-06 ENCOUNTER — Encounter: Payer: Self-pay | Admitting: Physical Medicine & Rehabilitation

## 2013-02-28 ENCOUNTER — Ambulatory Visit (HOSPITAL_BASED_OUTPATIENT_CLINIC_OR_DEPARTMENT_OTHER): Payer: BC Managed Care – PPO | Admitting: Physical Medicine & Rehabilitation

## 2013-02-28 ENCOUNTER — Encounter: Payer: Self-pay | Admitting: Physical Medicine & Rehabilitation

## 2013-02-28 ENCOUNTER — Encounter: Payer: BC Managed Care – PPO | Attending: Physical Medicine & Rehabilitation

## 2013-02-28 VITALS — BP 142/83 | HR 78 | Resp 14 | Ht 62.0 in | Wt 156.0 lb

## 2013-02-28 DIAGNOSIS — M6580 Other synovitis and tenosynovitis, unspecified site: Secondary | ICD-10-CM

## 2013-02-28 DIAGNOSIS — M545 Low back pain, unspecified: Secondary | ICD-10-CM | POA: Insufficient documentation

## 2013-02-28 DIAGNOSIS — M2420 Disorder of ligament, unspecified site: Secondary | ICD-10-CM | POA: Insufficient documentation

## 2013-02-28 DIAGNOSIS — M629 Disorder of muscle, unspecified: Secondary | ICD-10-CM | POA: Insufficient documentation

## 2013-02-28 DIAGNOSIS — M25559 Pain in unspecified hip: Secondary | ICD-10-CM | POA: Insufficient documentation

## 2013-02-28 DIAGNOSIS — M6289 Other specified disorders of muscle: Secondary | ICD-10-CM

## 2013-02-28 DIAGNOSIS — R269 Unspecified abnormalities of gait and mobility: Secondary | ICD-10-CM | POA: Insufficient documentation

## 2013-02-28 NOTE — Patient Instructions (Signed)
Tensor fascia lata syndrome Ultrasound next visit Will need some directed physical therapy

## 2013-02-28 NOTE — Progress Notes (Signed)
  Subjective:    Patient ID: Brittany Rodgers, female    DOB: October 08, 1970, 42 y.o.   MRN: [id number]  HPI  Pain Inventory Average Pain 10 Pain Right Now 9 My pain is intermittent, burning and stabbing  In the last 24 hours, has pain interfered with the following? General activity 9 Relation with others 9 Enjoyment of life 10 What TIME of day is your pain at its worst? daytime Sleep (in general) Fair  Pain is worse with: walking, bending, standing and some activites Pain improves with: rest, heat/ice and medication Relief from Meds: 8  Mobility walk without assistance ability to climb steps?  yes do you drive?  yes  Function employed # of hrs/week 40 I need assistance with the following:  household duties and shopping  Neuro/Psych weakness trouble walking spasms  Prior Studies Any changes since last visit?  no  Physicians involved in your care Any changes since last visit?  no Primary care Gershon Crane   Family History  Problem Relation Age of Onset  . Hypertension    . Breast cancer     History   Social History  . Marital Status: Married    Spouse Name: N/A    Number of Children: N/A  . Years of Education: N/A   Social History Main Topics  . Smoking status: Never Smoker   . Smokeless tobacco: Never Used  . Alcohol Use: No  . Drug Use: No  . Sexually Active: None   Other Topics Concern  . None   Social History Narrative  . None   Past Surgical History  Procedure Laterality Date  . Cesarean section    . Root canal     Past Medical History  Diagnosis Date  . GERD (gastroesophageal reflux disease)   . IBS (irritable bowel syndrome)     sees Dr. Stan Head   . Chronic cough   . Routine gynecological examination     sees Dr. Malva Limes  . Hyperlipidemia    BP 142/83  Pulse 78  Resp 14  Ht 5\' 2"  (1.575 m)  Wt 156 lb (70.761 kg)  BMI 28.53 kg/m2  SpO2 100%    Review of Systems  Musculoskeletal: Positive for gait problem.   Spasms  Neurological: Positive for weakness.  All other systems reviewed and are negative.       Objective:   Physical Exam        Assessment & Plan:  See other progress note from today

## 2013-02-28 NOTE — Progress Notes (Signed)
  Subjective:    Brittany Rodgers is a 42 y.o. female who presents for evaluation of low back pain. The patient has had Prior back injury. Symptoms have been present for 2 yr and are gradually worsening.  Onset was related to / precipitated by no known injury and Pushing an object at work. The pain is located in the right hip and Right thigh. The pain is described as aching and burning and occurs intermittently. Right hip Symptoms are exacerbated by walking for more than 10 minutes. Symptoms are improved by heat and muscle relaxants. She has also tried PT for back problems which provided no symptom relief. She has weakness in the right leg associated with the back pain. The patient has no "red flag" history indicative of complicated back pain.  The following portions of the patient's history were reviewed and updated as appropriate: allergies, current medications, past family history, past medical history, past social history, past surgical history and problem list.  Review of Systems Pertinent items are noted in HPI.    Objective:   Full range of motion without pain, no tenderness, no spasm, no curvature. Normal reflexes, gait, strength and negative straight-leg raise. Inspection and palpation: inspection of back is normal.   Tenderness over the right ASIS and immediately below in the tensor fascial muscle belly Pain with passive hip extension.  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 Will check with musculoskeletal ultrasound in the office. Also will need physical therapy specific to that muscle In  addition may require trigger point injection.  L. take any other imaging studies are needed at this time. I do not think her complaints are muscle related or hip joint related      Plan:      See above  Also see other progress note from today

## 2013-03-27 ENCOUNTER — Encounter: Payer: Self-pay | Admitting: Physical Medicine & Rehabilitation

## 2013-03-27 ENCOUNTER — Ambulatory Visit (HOSPITAL_BASED_OUTPATIENT_CLINIC_OR_DEPARTMENT_OTHER): Payer: BC Managed Care – PPO | Admitting: Physical Medicine & Rehabilitation

## 2013-03-27 ENCOUNTER — Encounter: Payer: BC Managed Care – PPO | Attending: Physical Medicine & Rehabilitation

## 2013-03-27 VITALS — BP 141/87 | HR 73 | Resp 14 | Ht 62.0 in | Wt 153.0 lb

## 2013-03-27 DIAGNOSIS — G8929 Other chronic pain: Secondary | ICD-10-CM

## 2013-03-27 DIAGNOSIS — M545 Low back pain, unspecified: Secondary | ICD-10-CM | POA: Insufficient documentation

## 2013-03-27 DIAGNOSIS — M629 Disorder of muscle, unspecified: Secondary | ICD-10-CM | POA: Insufficient documentation

## 2013-03-27 DIAGNOSIS — M25559 Pain in unspecified hip: Secondary | ICD-10-CM

## 2013-03-27 DIAGNOSIS — M6580 Other synovitis and tenosynovitis, unspecified site: Secondary | ICD-10-CM

## 2013-03-27 DIAGNOSIS — M2420 Disorder of ligament, unspecified site: Secondary | ICD-10-CM | POA: Insufficient documentation

## 2013-03-27 DIAGNOSIS — M6289 Other specified disorders of muscle: Secondary | ICD-10-CM

## 2013-03-27 DIAGNOSIS — R269 Unspecified abnormalities of gait and mobility: Secondary | ICD-10-CM | POA: Insufficient documentation

## 2013-03-27 NOTE — Progress Notes (Signed)
Limited ultrasound right hip Indication right hip pain, tensor fascia lata syndrome, evaluate for tendinopathy or muscle tear  12 Hz linear transducer Patient in supine position. RN assisting Long axis he is of the ASIS with insertion of TFL showed no evidence of muscle tear. No evidence of avulsion of the ASIS. Iliopsoas muscle intact. Doppler ultrasound over that area demonstrated no abnormalities. Femoral artery identified on Doppler imaging no tenderness over the inguinal area with sonopalpation.  Patient tolerated procedure well  Impression: 1. Normal Limited ultrasound of right hip area of focusing on the right ASIS

## 2013-03-27 NOTE — Patient Instructions (Signed)
No muscle or tendon tears noted I suspect that this is a tight muscle call the tensor fascia lata Time prescribed and physical therapy. Goal reach a cue in 6 weeks. If not much better consider trigger point injection

## 2013-06-27 ENCOUNTER — Telehealth: Payer: Self-pay | Admitting: Family Medicine

## 2013-06-27 NOTE — Telephone Encounter (Signed)
She will have to ask Dr. Wynn Banker since he is treating her leg pain

## 2013-06-27 NOTE — Telephone Encounter (Signed)
Pt needs refill on hydrocodone for leg pain call into [facility] [address]

## 2013-06-30 ENCOUNTER — Telehealth: Payer: Self-pay | Admitting: Family Medicine

## 2013-06-30 NOTE — Telephone Encounter (Signed)
Per Dr. Fry, okay to schedule. 

## 2013-06-30 NOTE — Telephone Encounter (Signed)
lmom/kh 

## 2013-06-30 NOTE — Telephone Encounter (Signed)
Pt has 3 boils on her leg and would like dr fry to take a look. Pt cannot leave work but can leave early tomorrow if she could have the 4:15 appt. pls advise.

## 2013-06-30 NOTE — Telephone Encounter (Signed)
I left voice message with below information. 

## 2013-07-01 ENCOUNTER — Ambulatory Visit (INDEPENDENT_AMBULATORY_CARE_PROVIDER_SITE_OTHER): Payer: BC Managed Care – PPO | Admitting: Family Medicine

## 2013-07-01 ENCOUNTER — Encounter: Payer: Self-pay | Admitting: Family Medicine

## 2013-07-01 VITALS — BP 128/70 | HR 86 | Temp 98.8°F | Wt 156.0 lb

## 2013-07-01 DIAGNOSIS — L0292 Furuncle, unspecified: Secondary | ICD-10-CM

## 2013-07-01 MED ORDER — DOXYCYCLINE HYCLATE 100 MG PO CAPS: 100.0000 mg | ORAL_CAPSULE | Freq: Two times a day (BID) | ORAL | Status: AC

## 2013-07-01 NOTE — Progress Notes (Signed)
  Subjective:    Patient ID: Brittany Rodgers, female    DOB: 07-27-1971, 42 y.o.   MRN: [id number]  HPI Here for recurrent boils. She first developed a boil on the buttocks about 3 months ago and this resolved on its own over time. Then 3 weeks ago another one came up on the right thigh, and now 2 more have appeared on the right thigh. They are quite painful . No fever. Her husband was treated for boils about 5 months ago. She has never had these before.   Review of Systems  Constitutional: Negative.   Skin: Positive for wound.       Objective:   Physical Exam  Constitutional: She appears well-developed and well-nourished.  Skin:  The right anterior thigh has one old and two fresh boils. These are tender and warm. A small amount of purulent fluid can be expressed from them.           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 A culture was obtained. Start on Doxycycline for 30 days

## 2013-07-04 LAB — WOUND CULTURE

## 2013-07-07 ENCOUNTER — Ambulatory Visit: Payer: BC Managed Care – PPO | Attending: Physical Medicine & Rehabilitation | Admitting: Physical Therapy

## 2013-07-07 DIAGNOSIS — M25559 Pain in unspecified hip: Secondary | ICD-10-CM | POA: Insufficient documentation

## 2013-07-07 DIAGNOSIS — IMO0001 Reserved for inherently not codable concepts without codable children: Secondary | ICD-10-CM | POA: Insufficient documentation

## 2013-07-07 DIAGNOSIS — R262 Difficulty in walking, not elsewhere classified: Secondary | ICD-10-CM | POA: Insufficient documentation

## 2013-07-07 DIAGNOSIS — M6281 Muscle weakness (generalized): Secondary | ICD-10-CM | POA: Insufficient documentation

## 2013-07-07 NOTE — Progress Notes (Signed)
Quick Note:  I spoke with pt ______ 

## 2013-07-11 NOTE — Telephone Encounter (Signed)
Done kh

## 2013-07-14 ENCOUNTER — Ambulatory Visit: Payer: BC Managed Care – PPO | Admitting: Physical Therapy

## 2013-07-21 ENCOUNTER — Ambulatory Visit: Payer: BC Managed Care – PPO | Admitting: Physical Therapy

## 2013-07-28 ENCOUNTER — Ambulatory Visit: Payer: BC Managed Care – PPO | Attending: Physical Medicine & Rehabilitation | Admitting: Physical Therapy

## 2013-07-28 DIAGNOSIS — M6281 Muscle weakness (generalized): Secondary | ICD-10-CM | POA: Insufficient documentation

## 2013-07-28 DIAGNOSIS — IMO0001 Reserved for inherently not codable concepts without codable children: Secondary | ICD-10-CM | POA: Insufficient documentation

## 2013-07-28 DIAGNOSIS — M25559 Pain in unspecified hip: Secondary | ICD-10-CM | POA: Insufficient documentation

## 2013-07-28 DIAGNOSIS — R262 Difficulty in walking, not elsewhere classified: Secondary | ICD-10-CM | POA: Insufficient documentation

## 2014-01-23 ENCOUNTER — Other Ambulatory Visit: Payer: Self-pay | Admitting: Obstetrics and Gynecology

## 2014-12-10 ENCOUNTER — Telehealth: Payer: Self-pay | Admitting: Family Medicine

## 2014-12-10 NOTE — Telephone Encounter (Signed)
I updated this in pt's chart.

## 2014-12-10 NOTE — Telephone Encounter (Signed)
Pt does not want a flu shot this year

## 2014-12-14 ENCOUNTER — Encounter: Payer: Self-pay | Admitting: Family Medicine

## 2014-12-14 ENCOUNTER — Ambulatory Visit (INDEPENDENT_AMBULATORY_CARE_PROVIDER_SITE_OTHER): Payer: BLUE CROSS/BLUE SHIELD | Admitting: Family Medicine

## 2014-12-14 VITALS — BP 123/87 | HR 70 | Temp 98.6°F | Ht 62.0 in | Wt 155.0 lb

## 2014-12-14 DIAGNOSIS — E785 Hyperlipidemia, unspecified: Secondary | ICD-10-CM

## 2014-12-14 DIAGNOSIS — H04123 Dry eye syndrome of bilateral lacrimal glands: Secondary | ICD-10-CM

## 2014-12-14 DIAGNOSIS — M62838 Other muscle spasm: Secondary | ICD-10-CM

## 2014-12-14 LAB — POCT URINALYSIS DIPSTICK
BILIRUBIN UA: NEGATIVE
Glucose, UA: NEGATIVE
KETONES UA: NEGATIVE
Leukocytes, UA: NEGATIVE
Nitrite, UA: NEGATIVE
Protein, UA: NEGATIVE
SPEC GRAV UA: 1.015
Urobilinogen, UA: 1
pH, UA: 7.5

## 2014-12-14 LAB — CBC WITH DIFFERENTIAL/PLATELET
BASOS ABS: 0 10*3/uL (ref 0.0–0.1)
Basophils Relative: 0.5 % (ref 0.0–3.0)
Eosinophils Absolute: 0.1 10*3/uL (ref 0.0–0.7)
Eosinophils Relative: 1.4 % (ref 0.0–5.0)
HEMATOCRIT: 41.6 % (ref 36.0–46.0)
HEMOGLOBIN: 13.9 g/dL (ref 12.0–15.0)
LYMPHS ABS: 1.8 10*3/uL (ref 0.7–4.0)
LYMPHS PCT: 29.2 % (ref 12.0–46.0)
MCHC: 33.4 g/dL (ref 30.0–36.0)
MCV: 86.7 fl (ref 78.0–100.0)
MONO ABS: 0.3 10*3/uL (ref 0.1–1.0)
MONOS PCT: 5.2 % (ref 3.0–12.0)
NEUTROS ABS: 3.9 10*3/uL (ref 1.4–7.7)
Neutrophils Relative %: 63.7 % (ref 43.0–77.0)
Platelets: 307 10*3/uL (ref 150.0–400.0)
RBC: 4.79 Mil/uL (ref 3.87–5.11)
RDW: 13.1 % (ref 11.5–15.5)
WBC: 6.1 10*3/uL (ref 4.0–10.5)

## 2014-12-14 LAB — HEPATIC FUNCTION PANEL
ALK PHOS: 62 U/L (ref 39–117)
ALT: 11 U/L (ref 0–35)
AST: 8 U/L (ref 0–37)
Albumin: 4.4 g/dL (ref 3.5–5.2)
BILIRUBIN DIRECT: 0.1 mg/dL (ref 0.0–0.3)
TOTAL PROTEIN: 7.7 g/dL (ref 6.0–8.3)
Total Bilirubin: 0.5 mg/dL (ref 0.2–1.2)

## 2014-12-14 LAB — LIPID PANEL
Cholesterol: 245 mg/dL — ABNORMAL HIGH (ref 0–200)
HDL: 49.4 mg/dL (ref >39.00)
LDL Cholesterol: 169 mg/dL — ABNORMAL HIGH (ref 0–99)
NONHDL: 195.6
Total CHOL/HDL Ratio: 5
Triglycerides: 133 mg/dL (ref 0.0–149.0)
VLDL: 26.6 mg/dL (ref 0.0–40.0)

## 2014-12-14 LAB — BASIC METABOLIC PANEL
BUN: 11 mg/dL (ref 6–23)
CALCIUM: 9.5 mg/dL (ref 8.4–10.5)
CO2: 25 mEq/L (ref 19–32)
Chloride: 104 mEq/L (ref 96–112)
Creatinine, Ser: 0.77 mg/dL (ref 0.40–1.20)
GFR: 104.14 mL/min (ref >60.00)
GLUCOSE: 78 mg/dL (ref 70–99)
POTASSIUM: 4.9 meq/L (ref 3.5–5.1)
SODIUM: 136 meq/L (ref 135–145)

## 2014-12-14 LAB — TSH: TSH: 2.29 u[IU]/mL (ref 0.35–4.50)

## 2014-12-14 MED ORDER — CYCLOBENZAPRINE HCL 10 MG PO TABS: 10.0000 mg | ORAL_TABLET | Freq: Three times a day (TID) | ORAL | Status: DC | PRN

## 2014-12-14 NOTE — Progress Notes (Signed)
Pre visit review using our clinic review tool, if applicable. No additional management support is needed unless otherwise documented below in the visit note. 

## 2014-12-14 NOTE — Progress Notes (Signed)
   Subjective:    Patient ID: Brittany Rodgers, female    DOB: 04-Nov-1970, 44 y.o.   MRN: [id number]  HPI Here for several things. First she asks to have some labs drawn to check her cholesterol among other things. Also she asks advice about her eyes. They have been burning and itching for the past year. She does not wear contacts. She had a full eye exam recently and was told she has dry eyes. She has been using Refresh drops but these do not help much. She also needs a refill on Flexeril.    Review of Systems  Constitutional: Negative.   HENT: Negative.   Eyes: Positive for redness and itching. Negative for photophobia, pain, discharge and visual disturbance.  Respiratory: Negative.   Musculoskeletal: Positive for myalgias.       Objective:   Physical Exam  Constitutional: She appears well-developed and well-nourished.  HENT:  Right Ear: External ear normal.  Nose: Nose normal.  Mouth/Throat: Oropharynx is clear and moist.  Eyes: Conjunctivae and EOM are normal. Pupils are equal, round, and reactive to light.  Pulmonary/Chest: Effort normal and breath sounds normal.  Lymphadenopathy:    She has no cervical adenopathy.          Assessment & Plan:  Try Vasocon drops OTC for the eyes. Refilled Flexeril. Get fasting labs today

## 2014-12-17 MED ORDER — ATORVASTATIN CALCIUM 20 MG PO TABS: 20.0000 mg | ORAL_TABLET | Freq: Every day | ORAL | Status: DC

## 2014-12-17 NOTE — Addendum Note (Signed)
Addended by: Colleen Can on: 12/17/2014 01:47 PM   Modules accepted: Orders

## 2015-01-25 ENCOUNTER — Other Ambulatory Visit: Payer: Self-pay | Admitting: Obstetrics and Gynecology

## 2015-01-26 LAB — CYTOLOGY - PAP

## 2015-06-24 ENCOUNTER — Ambulatory Visit: Payer: BLUE CROSS/BLUE SHIELD | Admitting: Adult Health

## 2015-06-24 ENCOUNTER — Ambulatory Visit (INDEPENDENT_AMBULATORY_CARE_PROVIDER_SITE_OTHER): Payer: BLUE CROSS/BLUE SHIELD | Admitting: Family Medicine

## 2015-06-24 ENCOUNTER — Encounter: Payer: Self-pay | Admitting: Family Medicine

## 2015-06-24 VITALS — BP 118/80 | HR 93 | Temp 98.8°F | Ht 62.0 in | Wt 158.3 lb

## 2015-06-24 DIAGNOSIS — H9202 Otalgia, left ear: Secondary | ICD-10-CM | POA: Diagnosis not present

## 2015-06-24 DIAGNOSIS — H60392 Other infective otitis externa, left ear: Secondary | ICD-10-CM | POA: Diagnosis not present

## 2015-06-24 MED ORDER — CIPROFLOXACIN-DEXAMETHASONE 0.3-0.1 % OT SUSP: 4.0000 [drp] | Freq: Two times a day (BID) | OTIC | Status: DC

## 2015-06-24 NOTE — Progress Notes (Signed)
Pre visit review using our clinic review tool, if applicable. No additional management support is needed unless otherwise documented below in the visit note. 

## 2015-06-24 NOTE — Progress Notes (Signed)
HPI:  L ear pain and discomfort: -started: 1 week ago but is worsening -symptoms: L ear popping, pain, feels "swollen", nasal congestion, PND -denies:fever, SOB, NVD, tooth pain, HA, drainage from ear -has tried: qtips, sweet oil and hydrogen peroxide -sick contacts/travel/risks: grandkids with ear infections recently  ROS: See pertinent positives and negatives per HPI.  Past Medical History  Diagnosis Date  . GERD (gastroesophageal reflux disease)   . IBS (irritable bowel syndrome)     sees Dr. Silvano Rusk   . Chronic cough   . Routine gynecological examination     sees Dr. Freda Munro  . Hyperlipidemia     Past Surgical History  Procedure Laterality Date  . Cesarean section    . Root canal      Family History  Problem Relation Age of Onset  . Hypertension    . Breast cancer      Social History   Social History  . Marital Status: Married    Spouse Name: N/A  . Number of Children: N/A  . Years of Education: N/A   Social History Main Topics  . Smoking status: Never Smoker   . Smokeless tobacco: Never Used  . Alcohol Use: No  . Drug Use: No  . Sexual Activity: Not Asked   Other Topics Concern  . None   Social History Narrative     Current outpatient prescriptions:  .  atorvastatin (LIPITOR) 20 MG tablet, Take 1 tablet (20 mg total) by mouth daily., Disp: 90 tablet, Rfl: 3 .  cyclobenzaprine (FLEXERIL) 10 MG tablet, Take 1 tablet (10 mg total) by mouth 3 (three) times daily as needed for muscle spasms., Disp: 90 tablet, Rfl: 5 .  HYDROCODONE-ACETAMINOPHEN PO, Take by mouth., Disp: , Rfl:  .  Multiple Vitamin (MULTIVITAMIN) tablet, Take 1 tablet by mouth daily., Disp: , Rfl:  .  Omeprazole-Sodium Bicarbonate (ZEGERID) 20-1100 MG CAPS, Take 1 capsule by mouth daily before breakfast., Disp: 30 each, Rfl: 11 .  polyethylene glycol (MIRALAX / GLYCOLAX) packet, Take 17 g by mouth as needed., Disp: 100 each, Rfl: 5 .  ciprofloxacin-dexamethasone (CIPRODEX)  otic suspension, Place 4 drops into the left ear 2 (two) times daily., Disp: 7.5 mL, Rfl: 0  EXAM:  Filed Vitals:   06/24/15 1607  BP: 118/80  Pulse: 93  Temp: 98.8 F (37.1 C)    Body mass index is 28.95 kg/(m^2).  GENERAL: vitals reviewed and listed above, alert, oriented, appears well hydrated and in no acute distress  HEENT: atraumatic, conjunttiva clear, no obvious abnormalities on inspection of external nose and ears, normal appearance of ear canals and TMs except for white soft debris in L ear canal obstructing view of TM and mild erythema of canal, clear nasal congestion, mild post oropharyngeal erythema with PND, no tonsillar edema or exudate, no sinus TTP  NECK: no obvious masses on inspection  LUNGS: clear to auscultation bilaterally, no wheezes, rales or rhonchi, good air movement  CV: HRRR, no peripheral edema  MS: moves all extremities without noticeable abnormality  PSYCH: pleasant and cooperative, no obvious depression or anxiety  ASSESSMENT AND PLAN:  Discussed the following assessment and plan:  Ear pain, left  Otitis, externa, infective, left  -gently attempted removal of debris with soft curette, but tugging on ear and even the slightest touch of the curette cause her discomfort so procedure not performed -concern for cerumen impaction and possible mild OE -opted for tx with topical abx and follow up with ENT if symptoms  persist for removal of debri and visualization of TM -of course, we advised to return or notify a doctor immediately if symptoms worsen or persist or new concerns arise.    Patient Instructions  Please use the drops as instructed and follow up with the ear, nose and throat specialist if symptoms persist or worsen.  Do not use any other products or qtips in the ears.  Otitis Externa Otitis externa is a bacterial or fungal infection of the outer ear canal. This is the area from the eardrum to the outside of the ear. Otitis externa is  sometimes called "swimmer's ear." CAUSES  Possible causes of infection include:  Swimming in dirty water.  Moisture remaining in the ear after swimming or bathing.  Mild injury (trauma) to the ear.  Objects stuck in the ear (foreign body).  Cuts or scrapes (abrasions) on the outside of the ear. SIGNS AND SYMPTOMS  The first symptom of infection is often itching in the ear canal. Later signs and symptoms may include swelling and redness of the ear canal, ear pain, and yellowish-white fluid (pus) coming from the ear. The ear pain may be worse when pulling on the earlobe. DIAGNOSIS  Your health care provider will perform a physical exam. A sample of fluid may be taken from the ear and examined for bacteria or fungi. TREATMENT  Antibiotic ear drops are often given for 10 to 14 days. Treatment may also include pain medicine or corticosteroids to reduce itching and swelling. HOME CARE INSTRUCTIONS   Apply antibiotic ear drops to the ear canal as prescribed by your health care provider.  Take medicines only as directed by your health care provider.  If you have diabetes, follow any additional treatment instructions from your health care provider.  Keep all follow-up visits as directed by your health care provider. PREVENTION   Keep your ear dry. Use the corner of a towel to absorb water out of the ear canal after swimming or bathing.  Avoid scratching or putting objects inside your ear. This can damage the ear canal or remove the protective wax that lines the canal. This makes it easier for bacteria and fungi to grow.  Avoid swimming in lakes, polluted water, or poorly chlorinated pools.  You may use ear drops made of rubbing alcohol and vinegar after swimming. Combine equal parts of white vinegar and alcohol in a bottle. Put 3 or 4 drops into each ear after swimming. SEEK MEDICAL CARE IF:   You have a fever.  Your ear is still red, swollen, painful, or draining pus after 3  days.  Your redness, swelling, or pain gets worse.  You have a severe headache.  You have redness, swelling, pain, or tenderness in the area behind your ear. MAKE SURE YOU:   Understand these instructions.  Will watch your condition.  Will get help right away if you are not doing well or get worse. Document Released: 09/11/2005 Document Revised: 01/26/2014 Document Reviewed: 09/28/2011 [facility] Patient Information 2015 [place], [place]. This information is not intended to replace advice given to you by your health care provider. Make sure you discuss any questions you have with your health care provider.      Colin Benton R.

## 2015-06-24 NOTE — Patient Instructions (Signed)
Please use the drops as instructed and follow up with the ear, nose and throat specialist if symptoms persist or worsen.  Do not use any other products or qtips in the ears.  Otitis Externa Otitis externa is a bacterial or fungal infection of the outer ear canal. This is the area from the eardrum to the outside of the ear. Otitis externa is sometimes called "swimmer's ear." CAUSES  Possible causes of infection include:  Swimming in dirty water.  Moisture remaining in the ear after swimming or bathing.  Mild injury (trauma) to the ear.  Objects stuck in the ear (foreign body).  Cuts or scrapes (abrasions) on the outside of the ear. SIGNS AND SYMPTOMS  The first symptom of infection is often itching in the ear canal. Later signs and symptoms may include swelling and redness of the ear canal, ear pain, and yellowish-white fluid (pus) coming from the ear. The ear pain may be worse when pulling on the earlobe. DIAGNOSIS  Your health care provider will perform a physical exam. A sample of fluid may be taken from the ear and examined for bacteria or fungi. TREATMENT  Antibiotic ear drops are often given for 10 to 14 days. Treatment may also include pain medicine or corticosteroids to reduce itching and swelling. HOME CARE INSTRUCTIONS   Apply antibiotic ear drops to the ear canal as prescribed by your health care provider.  Take medicines only as directed by your health care provider.  If you have diabetes, follow any additional treatment instructions from your health care provider.  Keep all follow-up visits as directed by your health care provider. PREVENTION   Keep your ear dry. Use the corner of a towel to absorb water out of the ear canal after swimming or bathing.  Avoid scratching or putting objects inside your ear. This can damage the ear canal or remove the protective wax that lines the canal. This makes it easier for bacteria and fungi to grow.  Avoid swimming in lakes,  polluted water, or poorly chlorinated pools.  You may use ear drops made of rubbing alcohol and vinegar after swimming. Combine equal parts of white vinegar and alcohol in a bottle. Put 3 or 4 drops into each ear after swimming. SEEK MEDICAL CARE IF:   You have a fever.  Your ear is still red, swollen, painful, or draining pus after 3 days.  Your redness, swelling, or pain gets worse.  You have a severe headache.  You have redness, swelling, pain, or tenderness in the area behind your ear. MAKE SURE YOU:   Understand these instructions.  Will watch your condition.  Will get help right away if you are not doing well or get worse. Document Released: 09/11/2005 Document Revised: 01/26/2014 Document Reviewed: 09/28/2011 [facility] Patient Information 2015 [place], [place]. This information is not intended to replace advice given to you by your health care provider. Make sure you discuss any questions you have with your health care provider.

## 2015-11-26 ENCOUNTER — Ambulatory Visit (INDEPENDENT_AMBULATORY_CARE_PROVIDER_SITE_OTHER): Payer: BLUE CROSS/BLUE SHIELD | Admitting: Family Medicine

## 2015-11-26 ENCOUNTER — Encounter: Payer: Self-pay | Admitting: Family Medicine

## 2015-11-26 VITALS — BP 130/89 | HR 78 | Temp 98.3°F | Ht 62.0 in | Wt 162.0 lb

## 2015-11-26 DIAGNOSIS — G8929 Other chronic pain: Secondary | ICD-10-CM | POA: Diagnosis not present

## 2015-11-26 DIAGNOSIS — M25551 Pain in right hip: Secondary | ICD-10-CM

## 2015-11-26 DIAGNOSIS — E785 Hyperlipidemia, unspecified: Secondary | ICD-10-CM | POA: Diagnosis not present

## 2015-11-26 LAB — CBC WITH DIFFERENTIAL/PLATELET
BASOS ABS: 0 10*3/uL (ref 0.0–0.1)
Basophils Relative: 0.5 % (ref 0.0–3.0)
Eosinophils Absolute: 0.2 10*3/uL (ref 0.0–0.7)
Eosinophils Relative: 2.8 % (ref 0.0–5.0)
HEMATOCRIT: 41 % (ref 36.0–46.0)
Hemoglobin: 13.6 g/dL (ref 12.0–15.0)
LYMPHS ABS: 2.2 10*3/uL (ref 0.7–4.0)
Lymphocytes Relative: 34.3 % (ref 12.0–46.0)
MCHC: 33.1 g/dL (ref 30.0–36.0)
MCV: 87.3 fl (ref 78.0–100.0)
MONO ABS: 0.3 10*3/uL (ref 0.1–1.0)
Monocytes Relative: 4.4 % (ref 3.0–12.0)
Neutro Abs: 3.7 10*3/uL (ref 1.4–7.7)
Neutrophils Relative %: 58 % (ref 43.0–77.0)
PLATELETS: 357 10*3/uL (ref 150.0–400.0)
RBC: 4.69 Mil/uL (ref 3.87–5.11)
RDW: 13 % (ref 11.5–15.5)
WBC: 6.4 10*3/uL (ref 4.0–10.5)

## 2015-11-26 LAB — POC URINALSYSI DIPSTICK (AUTOMATED)
Bilirubin, UA: NEGATIVE
Glucose, UA: NEGATIVE
KETONES UA: NEGATIVE
LEUKOCYTES UA: NEGATIVE
Nitrite, UA: NEGATIVE
PH UA: 7
PROTEIN UA: NEGATIVE
SPEC GRAV UA: 1.015
UROBILINOGEN UA: 1

## 2015-11-26 LAB — LIPID PANEL
CHOL/HDL RATIO: 3
Cholesterol: 178 mg/dL (ref 0–200)
HDL: 53.3 mg/dL (ref >39.00)
LDL Cholesterol: 104 mg/dL — ABNORMAL HIGH (ref 0–99)
NONHDL: 125
Triglycerides: 104 mg/dL (ref 0.0–149.0)
VLDL: 20.8 mg/dL (ref 0.0–40.0)

## 2015-11-26 LAB — BASIC METABOLIC PANEL
BUN: 9 mg/dL (ref 6–23)
CALCIUM: 9.6 mg/dL (ref 8.4–10.5)
CO2: 30 mEq/L (ref 19–32)
CREATININE: 0.67 mg/dL (ref 0.40–1.20)
Chloride: 102 mEq/L (ref 96–112)
GFR: 121.76 mL/min (ref >60.00)
Glucose, Bld: 93 mg/dL (ref 70–99)
Potassium: 4 mEq/L (ref 3.5–5.1)
Sodium: 138 mEq/L (ref 135–145)

## 2015-11-26 LAB — HEPATIC FUNCTION PANEL
ALT: 20 U/L (ref 0–35)
AST: 9 U/L (ref 0–37)
Albumin: 4.8 g/dL (ref 3.5–5.2)
Alkaline Phosphatase: 76 U/L (ref 39–117)
BILIRUBIN TOTAL: 0.7 mg/dL (ref 0.2–1.2)
Bilirubin, Direct: 0.1 mg/dL (ref 0.0–0.3)
Total Protein: 7.6 g/dL (ref 6.0–8.3)

## 2015-11-26 LAB — TSH: TSH: 2.21 u[IU]/mL (ref 0.35–4.50)

## 2015-11-26 MED ORDER — CYCLOBENZAPRINE HCL 10 MG PO TABS: 10.0000 mg | ORAL_TABLET | Freq: Three times a day (TID) | ORAL | Status: DC | PRN

## 2015-11-26 MED ORDER — ATORVASTATIN CALCIUM 20 MG PO TABS: 20.0000 mg | ORAL_TABLET | Freq: Every day | ORAL | Status: DC

## 2015-11-26 NOTE — Progress Notes (Signed)
Pre visit review using our clinic review tool, if applicable. No additional management support is needed unless otherwise documented below in the visit note. 

## 2015-11-26 NOTE — Progress Notes (Signed)
   Subjective:    Patient ID: Brittany Rodgers, female    DOB: [birth year], 45 y.o.   MRN: [id number]  HPI Here to follow up on lipids and her right hip pain. She has dealt with this pain for 4-5 years after a Workers Comp injury. She saw Dr. Patrice Paradise and Dr. Othelia Pulling and this was felt to be a non-surgical issue. Then she saw Dr. Letta Pate and he sent her to PT for awhile. She still has daily pains in the right lower back and right hip area. She takes Ibuprofen and Flexeril prn. She watches her diet closely.    Review of Systems  Constitutional: Negative.   Respiratory: Negative.   Cardiovascular: Negative.   Musculoskeletal: Positive for back pain.       Objective:   Physical Exam  Constitutional: She appears well-developed and well-nourished.  Cardiovascular: Normal rate, regular rhythm, normal heart sounds and intact distal pulses.   Pulmonary/Chest: Effort normal and breath sounds normal.  Musculoskeletal: Normal range of motion. She exhibits no edema or tenderness.          Assessment & Plan:  She will get fasting labs today to check lipids, etc. We will refer her back to Dr. Letta Pate for the hip pain.

## 2016-01-27 ENCOUNTER — Other Ambulatory Visit: Payer: Self-pay | Admitting: Obstetrics and Gynecology

## 2016-01-28 LAB — CYTOLOGY - PAP

## 2016-11-10 ENCOUNTER — Ambulatory Visit (INDEPENDENT_AMBULATORY_CARE_PROVIDER_SITE_OTHER): Payer: BLUE CROSS/BLUE SHIELD | Admitting: Family Medicine

## 2016-11-10 ENCOUNTER — Encounter: Payer: Self-pay | Admitting: Family Medicine

## 2016-11-10 VITALS — BP 137/95 | HR 78 | Temp 98.5°F | Ht 62.0 in | Wt 158.0 lb

## 2016-11-10 DIAGNOSIS — E782 Mixed hyperlipidemia: Secondary | ICD-10-CM | POA: Diagnosis not present

## 2016-11-10 DIAGNOSIS — J019 Acute sinusitis, unspecified: Secondary | ICD-10-CM

## 2016-11-10 LAB — CBC WITH DIFFERENTIAL/PLATELET
BASOS PCT: 0.5 % (ref 0.0–3.0)
Basophils Absolute: 0 10*3/uL (ref 0.0–0.1)
EOS PCT: 0.1 % (ref 0.0–5.0)
Eosinophils Absolute: 0 10*3/uL (ref 0.0–0.7)
HEMATOCRIT: 41.5 % (ref 36.0–46.0)
HEMOGLOBIN: 13.8 g/dL (ref 12.0–15.0)
LYMPHS PCT: 38.4 % (ref 12.0–46.0)
Lymphs Abs: 1.5 10*3/uL (ref 0.7–4.0)
MCHC: 33.2 g/dL (ref 30.0–36.0)
MCV: 87 fl (ref 78.0–100.0)
MONO ABS: 0.2 10*3/uL (ref 0.1–1.0)
MONOS PCT: 5.6 % (ref 3.0–12.0)
Neutro Abs: 2.1 10*3/uL (ref 1.4–7.7)
Neutrophils Relative %: 55.4 % (ref 43.0–77.0)
Platelets: 360 10*3/uL (ref 150.0–400.0)
RBC: 4.77 Mil/uL (ref 3.87–5.11)
RDW: 13.5 % (ref 11.5–15.5)
WBC: 3.9 10*3/uL — AB (ref 4.0–10.5)

## 2016-11-10 LAB — LIPID PANEL
CHOLESTEROL: 131 mg/dL (ref 0–200)
HDL: 35 mg/dL — AB (ref >39.00)
LDL Cholesterol: 79 mg/dL (ref 0–99)
NONHDL: 96.45
Total CHOL/HDL Ratio: 4
Triglycerides: 87 mg/dL (ref 0.0–149.0)
VLDL: 17.4 mg/dL (ref 0.0–40.0)

## 2016-11-10 LAB — BASIC METABOLIC PANEL
BUN: 9 mg/dL (ref 6–23)
CALCIUM: 9 mg/dL (ref 8.4–10.5)
CO2: 29 mEq/L (ref 19–32)
CREATININE: 0.73 mg/dL (ref 0.40–1.20)
Chloride: 99 mEq/L (ref 96–112)
GFR: 109.83 mL/min (ref >60.00)
Glucose, Bld: 90 mg/dL (ref 70–99)
Potassium: 3.3 mEq/L — ABNORMAL LOW (ref 3.5–5.1)
Sodium: 138 mEq/L (ref 135–145)

## 2016-11-10 LAB — HEPATIC FUNCTION PANEL
ALT: 14 U/L (ref 0–35)
AST: 10 U/L (ref 0–37)
Albumin: 4.4 g/dL (ref 3.5–5.2)
Alkaline Phosphatase: 76 U/L (ref 39–117)
BILIRUBIN DIRECT: 0.1 mg/dL (ref 0.0–0.3)
BILIRUBIN TOTAL: 0.4 mg/dL (ref 0.2–1.2)
Total Protein: 7.3 g/dL (ref 6.0–8.3)

## 2016-11-10 LAB — TSH: TSH: 1.56 u[IU]/mL (ref 0.35–4.50)

## 2016-11-10 MED ORDER — AMOXICILLIN-POT CLAVULANATE 875-125 MG PO TABS: 1.0000 | ORAL_TABLET | Freq: Two times a day (BID) | ORAL | 0 refills | Status: DC

## 2016-11-10 NOTE — Progress Notes (Signed)
   Subjective:    Patient ID: Brittany Rodgers, female    DOB: Dec 18, 1970, 46 y.o.   MRN: [id number]  HPI Here for 6 days of sinus pressure, PND, and blowing green mucus from the nose. No fever or cough.    Review of Systems  Constitutional: Negative.   HENT: Positive for congestion, postnasal drip, sinus pain and sinus pressure. Negative for sore throat.   Eyes: Negative.   Respiratory: Negative.        Objective:   Physical Exam  Constitutional: She appears well-developed and well-nourished.  HENT:  Right Ear: External ear normal.  Left Ear: External ear normal.  Nose: Nose normal.  Mouth/Throat: Oropharynx is clear and moist.  Eyes: Conjunctivae are normal.  Neck: No thyromegaly present.  Pulmonary/Chest: Effort normal and breath sounds normal.  Lymphadenopathy:    She has no cervical adenopathy.          Assessment & Plan:  Sinusitis, treat with Augmentin. Written out of work 11-06-16 through 11-10-16.  Alysia Penna, MD

## 2016-11-10 NOTE — Progress Notes (Signed)
Pre visit review using our clinic review tool, if applicable. No additional management support is needed unless otherwise documented below in the visit note. 

## 2016-11-17 MED ORDER — POTASSIUM CHLORIDE ER 10 MEQ PO TBCR: 10.0000 meq | EXTENDED_RELEASE_TABLET | Freq: Every day | ORAL | 3 refills | Status: DC

## 2016-11-17 NOTE — Addendum Note (Signed)
Addended by: Abelardo Diesel on: 11/17/2016 11:02 AM   Modules accepted: Orders

## 2016-12-18 ENCOUNTER — Other Ambulatory Visit: Payer: Self-pay | Admitting: Family Medicine

## 2017-01-29 ENCOUNTER — Other Ambulatory Visit: Payer: Self-pay | Admitting: Obstetrics and Gynecology

## 2017-01-31 LAB — CYTOLOGY - PAP

## 2017-02-13 ENCOUNTER — Telehealth: Payer: Self-pay | Admitting: Family Medicine

## 2017-02-13 ENCOUNTER — Encounter: Payer: Self-pay | Admitting: Family Medicine

## 2017-02-13 ENCOUNTER — Ambulatory Visit (INDEPENDENT_AMBULATORY_CARE_PROVIDER_SITE_OTHER): Payer: BLUE CROSS/BLUE SHIELD | Admitting: Family Medicine

## 2017-02-13 VITALS — BP 140/88 | HR 90 | Temp 98.5°F | Wt 160.2 lb

## 2017-02-13 DIAGNOSIS — E876 Hypokalemia: Secondary | ICD-10-CM | POA: Insufficient documentation

## 2017-02-13 DIAGNOSIS — J018 Other acute sinusitis: Secondary | ICD-10-CM | POA: Diagnosis not present

## 2017-02-13 LAB — BASIC METABOLIC PANEL
BUN: 10 mg/dL (ref 6–23)
CALCIUM: 9.9 mg/dL (ref 8.4–10.5)
CHLORIDE: 100 meq/L (ref 96–112)
CO2: 31 mEq/L (ref 19–32)
CREATININE: 0.79 mg/dL (ref 0.40–1.20)
GFR: 100.15 mL/min (ref >60.00)
Glucose, Bld: 89 mg/dL (ref 70–99)
POTASSIUM: 3.8 meq/L (ref 3.5–5.1)
Sodium: 139 mEq/L (ref 135–145)

## 2017-02-13 MED ORDER — AMOXICILLIN-POT CLAVULANATE 875-125 MG PO TABS: 1.0000 | ORAL_TABLET | Freq: Two times a day (BID) | ORAL | 0 refills | Status: DC

## 2017-02-13 NOTE — Progress Notes (Signed)
   Subjective:    Patient ID: Brittany Rodgers, female    DOB: [birth year], 46 y.o.   MRN: [id number]  HPI Here for 2 weeks of sinus pressure, PND, and coughing up yellow sputum. No fever. Using Robitussin. She has been taking potassium pills since February. This has helped her muscle aches and fatigue.    Review of Systems  Constitutional: Negative.   HENT: Positive for congestion, postnasal drip, sinus pain and sinus pressure. Negative for sore throat.   Eyes: Negative.   Respiratory: Positive for cough.        Objective:   Physical Exam  Constitutional: She appears well-developed and well-nourished.  HENT:  Right Ear: External ear normal.  Left Ear: External ear normal.  Nose: Nose normal.  Mouth/Throat: Oropharynx is clear and moist.  Eyes: Conjunctivae are normal.  Neck: No thyromegaly present.  Pulmonary/Chest: Effort normal and breath sounds normal. No respiratory distress. She has no wheezes. She has no rales.  Lymphadenopathy:    She has no cervical adenopathy.          Assessment & Plan:  Treat the sinusitis with Augmentin. Check a BMET for the potassium level.  Alysia Penna, MD

## 2017-02-13 NOTE — Telephone Encounter (Signed)
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

## 2017-02-13 NOTE — Patient Instructions (Signed)
WE NOW OFFER   [facility] FAST TRACK!!!  SAME DAY Appointments for ACUTE CARE  Such as: Sprains, Injuries, cuts, abrasions, rashes, muscle pain, joint pain, back pain Colds, flu, sore throats, headache, allergies, cough, fever  Ear pain, sinus and eye infections Abdominal pain, nausea, vomiting, diarrhea, upset stomach Animal/insect bites  3 Easy Ways to Schedule: Walk-In Scheduling Call in scheduling Mychart Sign-up: [email or web address]         

## 2017-02-13 NOTE — Telephone Encounter (Signed)
Pt would like to have a work note faxed over to her job stating that she was at the office [phone] (F) Attn: Personnel

## 2017-07-15 ENCOUNTER — Encounter (HOSPITAL_COMMUNITY): Payer: Self-pay | Admitting: Emergency Medicine

## 2017-07-15 ENCOUNTER — Emergency Department (HOSPITAL_COMMUNITY): Payer: BLUE CROSS/BLUE SHIELD

## 2017-07-15 ENCOUNTER — Emergency Department (HOSPITAL_COMMUNITY)
Admission: EM | Admit: 2017-07-15 | Discharge: 2017-07-15 | Disposition: A | Discharge status: Home / Self Care | Payer: BLUE CROSS/BLUE SHIELD | Attending: Emergency Medicine | Admitting: Emergency Medicine

## 2017-07-15 DIAGNOSIS — Z79899 Other long term (current) drug therapy: Secondary | ICD-10-CM | POA: Diagnosis not present

## 2017-07-15 DIAGNOSIS — S39012A Strain of muscle, fascia and tendon of lower back, initial encounter: Secondary | ICD-10-CM

## 2017-07-15 DIAGNOSIS — Y9389 Activity, other specified: Secondary | ICD-10-CM | POA: Diagnosis not present

## 2017-07-15 DIAGNOSIS — Y998 Other external cause status: Secondary | ICD-10-CM | POA: Insufficient documentation

## 2017-07-15 DIAGNOSIS — Y92410 Unspecified street and highway as the place of occurrence of the external cause: Secondary | ICD-10-CM | POA: Insufficient documentation

## 2017-07-15 DIAGNOSIS — S3992XA Unspecified injury of lower back, initial encounter: Secondary | ICD-10-CM | POA: Diagnosis present

## 2017-07-15 LAB — POC URINE PREG, ED: PREG TEST UR: NEGATIVE

## 2017-07-15 MED ORDER — METHOCARBAMOL 500 MG PO TABS: 500.0000 mg | ORAL_TABLET | Freq: Every evening | ORAL | 0 refills | Status: DC | PRN

## 2017-07-15 MED ORDER — KETOROLAC TROMETHAMINE 60 MG/2ML IM SOLN
30.0000 mg | Freq: Once | INTRAMUSCULAR | Status: AC
  Administered 2017-07-15: 30 mg via INTRAMUSCULAR
  Filled 2017-07-15: qty 2

## 2017-07-15 MED ORDER — NAPROXEN 500 MG PO TABS: 500.0000 mg | ORAL_TABLET | Freq: Two times a day (BID) | ORAL | 0 refills | Status: DC

## 2017-07-15 NOTE — ED Notes (Signed)
Pt departed in NAD, refused use of wheelchair, ambulatory with standby assist only.
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
## 2017-07-15 NOTE — ED Notes (Signed)
Ice packs given to pt for shoulder & hip.

## 2017-07-15 NOTE — ED Notes (Signed)
Pt returned from xray
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
## 2017-07-15 NOTE — ED Notes (Signed)
ED Provider at bedside. 

## 2017-07-15 NOTE — ED Notes (Signed)
Patient transported to X-ray 

## 2017-07-24 ENCOUNTER — Ambulatory Visit (INDEPENDENT_AMBULATORY_CARE_PROVIDER_SITE_OTHER): Payer: BLUE CROSS/BLUE SHIELD | Admitting: Family Medicine

## 2017-07-24 ENCOUNTER — Encounter: Payer: Self-pay | Admitting: Family Medicine

## 2017-07-24 VITALS — BP 114/72 | HR 91 | Temp 99.0°F | Ht 62.0 in | Wt 156.8 lb

## 2017-07-24 DIAGNOSIS — S39012D Strain of muscle, fascia and tendon of lower back, subsequent encounter: Secondary | ICD-10-CM

## 2017-07-24 DIAGNOSIS — S46911D Strain of unspecified muscle, fascia and tendon at shoulder and upper arm level, right arm, subsequent encounter: Secondary | ICD-10-CM | POA: Diagnosis not present

## 2017-07-24 DIAGNOSIS — S29019D Strain of muscle and tendon of unspecified wall of thorax, subsequent encounter: Secondary | ICD-10-CM

## 2017-07-24 MED ORDER — CYCLOBENZAPRINE HCL 10 MG PO TABS: 10.0000 mg | ORAL_TABLET | Freq: Three times a day (TID) | ORAL | 2 refills | Status: DC | PRN
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
## 2017-08-07 ENCOUNTER — Ambulatory Visit: Payer: BLUE CROSS/BLUE SHIELD | Admitting: Physical Therapy

## 2017-08-08 ENCOUNTER — Ambulatory Visit: Payer: BLUE CROSS/BLUE SHIELD | Attending: Family Medicine

## 2017-08-08 DIAGNOSIS — M545 Low back pain, unspecified: Secondary | ICD-10-CM

## 2017-08-08 DIAGNOSIS — M25551 Pain in right hip: Secondary | ICD-10-CM | POA: Diagnosis present

## 2017-08-08 DIAGNOSIS — M25511 Pain in right shoulder: Secondary | ICD-10-CM | POA: Diagnosis present

## 2017-08-08 DIAGNOSIS — M546 Pain in thoracic spine: Secondary | ICD-10-CM | POA: Diagnosis not present

## 2017-08-08 NOTE — Patient Instructions (Signed)
Perform all exercises below:  Hold _20___ seconds. Repeat _3___ times.  Do __3__ sessions per day. CAUTION: Movement should be gentle, steady and slow.  Knee to Chest  Lying supine, bend involved knee to chest. Perform with each leg.  Copyright  VHI. All rights reserved.  Double Knee to Chest (Flexion)   Gently pull both knees toward chest. Feel stretch in lower back or buttock area. Breathing deeply, Lumbar Rotation: Caudal - Bilateral (Supine)  Feet and knees together, arms outstretched, rotate knees left, turning head in opposite direction, until stretch is felt.      HIP: Hamstrings - Costanzo Sitting   Rest leg on raised surface. Keep knee straight. Lift chest.   Hold all stretches 5-10 seconds and perform 5-10 times, 3 times a day.  .    Clasp hands together and raise arms above head, keeping elbows as straight as possible. Can be done sitting or lying.   Copyright  VHI. All rights reserved.   AROM: Lateral Neck Flexion    Slowly tilt head toward one shoulder, then the other. Hold each position __20__ seconds. Repeat _3___ times per set. Do ____ sets per session. Do __many__ sessions per day.  http://orth.exer.us/297   Copyright  VHI. All rights reserved.   [facility] [address], [place], [place] [address] Phone # [phone] Fax [phone]
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
## 2017-08-10 ENCOUNTER — Other Ambulatory Visit: Payer: Self-pay | Admitting: Family Medicine

## 2017-08-10 ENCOUNTER — Ambulatory Visit: Payer: BLUE CROSS/BLUE SHIELD | Admitting: Physical Therapy

## 2017-08-10 DIAGNOSIS — M546 Pain in thoracic spine: Secondary | ICD-10-CM | POA: Diagnosis not present

## 2017-08-10 DIAGNOSIS — M25511 Pain in right shoulder: Secondary | ICD-10-CM

## 2017-08-10 DIAGNOSIS — M545 Low back pain, unspecified: Secondary | ICD-10-CM

## 2017-08-10 DIAGNOSIS — M25551 Pain in right hip: Secondary | ICD-10-CM

## 2017-08-10 NOTE — Therapy (Signed)
[facility] [address] [place], [place], [address] Phone: [phone]   Fax:  [phone]  Physical Therapy Treatment  Patient Details  Name: Brittany Rodgers MRN: [id number] Date of Birth: 1970/11/07 Referring Provider: Alysia Penna, MD   Encounter Date: 08/10/2017  PT End of Session - 08/10/17 0813    Visit Number  2    Date for PT Re-Evaluation  10/03/17    PT Start Time  0802    PT Stop Time  0852 10 min untimed     PT Time Calculation (min)  50 min    Activity Tolerance  Patient tolerated treatment well;No increased pain    Behavior During Therapy  WFL for tasks assessed/performed       Past Medical History:  Diagnosis Date  . Chronic cough   . GERD (gastroesophageal reflux disease)   . Hyperlipidemia   . IBS (irritable bowel syndrome)    sees Dr. Silvano Rusk   . Routine gynecological examination    sees Dr. Freda Munro    Past Surgical History:  Procedure Laterality Date  . CESAREAN SECTION    . ROOT CANAL      There were no vitals filed for this visit.  Subjective Assessment - 08/10/17 0804    Subjective  Pt reports that things are about the same since her evaluation. She has been completing the exercises. She finds that wearing her corsett helps alot.     Pertinent History  none    Limitations  Sitting;Standing;Walking    How long can you sit comfortably?  uncomfortable at work    How long can you stand comfortably?  10-15 minutes    How long can you walk comfortably?  haven't tried    Diagnostic tests  x-ray at ED: negative    Patient Stated Goals  reduce pain, sit without pain, return to exercise    Currently in Pain?  Yes    Pain Score  9     Pain Location  Back    Pain Orientation  Mid;Lower;Right    Pain Descriptors / Indicators  Aching;Burning    Pain Type  Acute pain    Pain Radiating Towards  none     Pain Onset  1 to 4 weeks ago    Pain Frequency  Constant    Aggravating  Factors   sitting, standing    Pain Relieving Factors  ice, muscle relaxers, change of position     Effect of Pain on Daily Activities  unable to do regular activity                      [facility] Adult PT Treatment/Exercise - 08/10/17 0001      Exercises   Exercises  Lumbar      Lumbar Exercises: Stretches   Single Knee to Chest Stretch  5 reps;10 seconds      Lumbar Exercises: Standing   Other Standing Lumbar Exercises  UE ranged RUE L 24, x10 reps pt, x10 reps therapist providing scapular assist       Lumbar Exercises: Seated   Other Seated Lumbar Exercises  pulley into flexion/abduction x3 min each, 5 sec hold RUE end range      Lumbar Exercises: Supine   Other Supine Lumbar Exercises  Shoulder AAROM with cane x20 reps, therapist providing scapular assistance    Other Supine Lumbar Exercises  low trunk rotation x10 reps Lt/Rt       Modalities  Modalities  Cryotherapy      Cryotherapy   Cryotherapy Location  Other (comment) thoracic/lumbar spine    Type of Cryotherapy  Ice pack x10 min end of session      Manual Therapy   Manual Therapy  Soft tissue mobilization    Soft tissue mobilization  Gentle STM Rt thoracic paraspinals             PT Education - 08/10/17 0810    Education provided  Yes    Education Details  discussed PT POC and benefits of progressing AROM and strength gradually     Person(s) Educated  Patient    Methods  Explanation    Comprehension  Verbalized understanding       PT Coval Term Goals - 08/08/17 1332      PT Bango TERM GOAL #1   Title  be independent in initial HEP    Time  4    Period  Weeks    Status  New    Target Date  09/05/17      PT Godley TERM GOAL #2   Title  walk with dog for 15 minutes without increased LBP/thoracic pain    Time  4    Period  Weeks    Status  New    Target Date  09/05/17      PT Hartnett TERM GOAL #3   Title  demonstrate symmetry with ambulation due to reduced Rt hip pain    Time  4     Period  Weeks    Status  New    Target Date  09/05/17      PT Kassabian TERM GOAL #4   Title  report > or = to 30% reduction in thoracic/lumbar pain with work and home tasks    Time  4    Period  Weeks    Status  New      PT Mattern TERM GOAL #5   Title  demonstrate Rt=Lt shoulder A/ROM flexion to allow for reaching overhead with Rt UE    Time  4    Period  Weeks    Status  New    Target Date  09/05/17        PT Long Term Goals - 08/08/17 1245      PT LONG TERM GOAL #1   Title  be independent in advanced HEP    Time  8    Period  Weeks    Status  New    Target Date  10/03/17      PT LONG TERM GOAL #2   Title  reduce FOTO to < or = to 34% limitation    Time  8    Period  Weeks    Status  New    Target Date  10/03/17      PT LONG TERM GOAL #3   Title  return to walking for exercise without limitation or increased pain    Time  8    Period  Weeks    Status  New    Target Date  10/03/17      PT LONG TERM GOAL #4   Title  reach overhead with Rt UE without incresaed pain    Time  8    Period  Weeks    Status  New    Target Date  10/03/17      PT LONG TERM GOAL #5   Title  stand for > or = to 45 minutes  without limitation     Time  8    Period  Weeks    Status  New    Target Date  10/03/17      Additional Long Term Goals   Additional Long Term Goals  Yes      PT LONG TERM GOAL #6   Title  report a 75% reduction in thoracic/lumbar pain with ADLs and self-care    Time  8    Period  Weeks    Status  New    Target Date  10/03/17            Plan - 08/10/17 0813    Clinical Impression Statement  Pt arrived today having been completing her exercises as instructed since her evaluation. Although there was no decrease in pain upon arrival, this is to be expected considering this is her first treatment. Focused today on active assisted movement of the UEs, in addition to lumbar and thoracic gentle ROM. Pt does demonstrate lack of scapular mobility on the Rt, and  therapist addressed this with manual techniques. Pt did report decrease in pain with ROM exercises with therapist providing scapular assistance. End of session, pt demonstrated increase in shoulder AROM to 115 deg, from 90 deg prior to start of session.     Rehab Potential  Good    PT Frequency  3x / week    PT Duration  8 weeks    PT Treatment/Interventions  ADLs/Self Care Home Management;Cryotherapy;Electrical Stimulation;Ultrasound;Moist Heat;Therapeutic activities;Therapeutic exercise;Functional mobility training;Neuromuscular re-education;Patient/family education;Passive range of motion;Manual techniques;Dry needling;Taping    PT Next Visit Plan  gentle flexibility for Rt shoulder, lumbar and thoracic spine, kinesiotape, try e-stim    Consulted and Agree with Plan of Care  Patient       Patient will benefit from skilled therapeutic intervention in order to improve the following deficits and impairments:  Impaired flexibility, Difficulty walking, Decreased range of motion, Decreased strength, Impaired UE functional use, Increased muscle spasms, Pain, Decreased activity tolerance  Visit Diagnosis: Pain in thoracic spine  Acute bilateral low back pain without sciatica  Acute pain of right shoulder  Pain in right hip     Problem List Patient Active Problem List   Diagnosis Date Noted  . Hypokalemia 02/13/2017  . Chronic right hip pain 11/26/2015  . Hyperlipidemia 12/23/2010  . ALLERGIC RHINITIS CAUSE UNSPECIFIED 07/24/2008  . COUGH 02/26/2008  . ACUTE BRONCHITIS 01/20/2008  . VIRAL URI 12/03/2007  . ACUTE SINUSITIS, UNSPECIFIED 10/14/2007  . GERD 10/14/2007  . IBS 10/14/2007    9:31 AM,08/10/17 Elly Modena PT, DPT [facility] [address] [place], [place], [address] Phone: [phone]   Fax:  [phone]  Name: Brittany Rodgers MRN:  [id number] Date of Birth: 08/07/1971

## 2017-08-14 ENCOUNTER — Ambulatory Visit: Payer: BLUE CROSS/BLUE SHIELD

## 2017-08-14 ENCOUNTER — Telehealth: Payer: Self-pay | Admitting: Family Medicine

## 2017-08-14 DIAGNOSIS — M546 Pain in thoracic spine: Secondary | ICD-10-CM | POA: Diagnosis not present

## 2017-08-14 DIAGNOSIS — M25511 Pain in right shoulder: Secondary | ICD-10-CM

## 2017-08-14 DIAGNOSIS — M545 Low back pain, unspecified: Secondary | ICD-10-CM

## 2017-08-14 DIAGNOSIS — M25551 Pain in right hip: Secondary | ICD-10-CM

## 2017-08-14 NOTE — Telephone Encounter (Signed)
We did get the fax! Called pt and left a VM to inform them that we did get the paper work that was faxed.

## 2017-08-14 NOTE — Therapy (Signed)
[facility] [address] [place], [place], [address] Phone: [phone]   Fax:  [phone]  Physical Therapy Treatment  Patient Details  Name: Brittany Rodgers MRN: [id number] Date of Birth: June 06, 1971 Referring Provider: Alysia Penna, MD   Encounter Date: 08/14/2017  PT End of Session - 08/14/17 1536    Visit Number  3    Date for PT Re-Evaluation  10/03/17    PT Start Time  1532    PT Stop Time  1630    PT Time Calculation (min)  58 min    Activity Tolerance  Patient tolerated treatment well;No increased pain    Behavior During Therapy  WFL for tasks assessed/performed       Past Medical History:  Diagnosis Date  . Chronic cough   . GERD (gastroesophageal reflux disease)   . Hyperlipidemia   . IBS (irritable bowel syndrome)    sees Dr. Silvano Rusk   . Routine gynecological examination    sees Dr. Freda Munro    Past Surgical History:  Procedure Laterality Date  . CESAREAN SECTION    . ROOT CANAL      There were no vitals filed for this visit.  Subjective Assessment - 08/14/17 1535    Subjective  Pt. reporting pain levels "are about the same" today.      Patient Stated Goals  reduce pain, sit without pain, return to exercise    Currently in Pain?  Yes    Pain Score  9     Pain Location  Back    Pain Orientation  Lower;Mid    Pain Descriptors / Indicators  Aching;Burning    Pain Type  Acute pain    Pain Frequency  Constant    Aggravating Factors   Reaching overhead, normal house work, dressing    Pain Relieving Factors  ice, muscle relaxers, change     Multiple Pain Sites  No                      [facility] Adult PT Treatment/Exercise - 08/14/17 1548      Exercises   Exercises  Shoulder      Lumbar Exercises: Stretches   Passive Hamstring Stretch  2 reps;30 seconds    Passive Hamstring Stretch Limitations  strap     Piriformis Stretch  2 reps;30 seconds    Piriformis Stretch  Limitations  mod piri, Figure-4       Lumbar Exercises: Aerobic   Stationary Bike  Nustep: lvl 3, 4 min      Lumbar Exercises: Sidelying   Other Sidelying Lumbar Exercises  B open book stretch (without UE) 5" x 5 reps      Shoulder Exercises: Pulleys   Flexion  2 minutes    Flexion Limitations  R     ABduction  2 minutes    ABduction Limitations  R       Modalities   Modalities  Cryotherapy;Electrical Stimulation      Cryotherapy   Cryotherapy Location  Other (comment) thoracic     Type of Cryotherapy  Ice pack x 10 min to end sessiopn       Acupuncturist Location  R-sided thoracic spine     Electrical Stimulation Action  IFC    Electrical Stimulation Parameters  80-150Hz , intensity to pt. tolerance, 15'     Electrical Stimulation Goals  Tone;Pain      Manual Therapy   Manual  Therapy  Taping    Kinesiotex  IT sales professional  B I-strips along lumbar paraspinals at 30% stretch, 1 cross-strip at lumbar spine at level of most pain (30% stretch)               PT [place] Term Goals - 08/14/17 1712      PT [place] TERM GOAL #1   Title  be independent in initial HEP    Time  4    Period  Weeks    Status  On-going      PT [place] TERM GOAL #2   Title  walk with dog for 15 minutes without increased LBP/thoracic pain    Time  4    Period  Weeks    Status  On-going      PT Main TERM GOAL #3   Title  demonstrate symmetry with ambulation due to reduced Rt hip pain    Time  4    Period  Weeks    Status  On-going      PT Aubry TERM GOAL #4   Title  report > or = to 30% reduction in thoracic/lumbar pain with work and home tasks    Time  4    Period  Weeks    Status  On-going      PT Regula TERM GOAL #5   Title  demonstrate Rt=Lt shoulder A/ROM flexion to allow for reaching overhead with Rt UE    Time  4    Period  Weeks    Status  On-going        PT Long Term Goals - 08/14/17 1712      PT LONG TERM  GOAL #1   Title  be independent in advanced HEP    Time  8    Period  Weeks    Status  On-going      PT LONG TERM GOAL #2   Title  reduce FOTO to < or = to 34% limitation    Time  8    Period  Weeks    Status  On-going      PT LONG TERM GOAL #3   Title  return to walking for exercise without limitation or increased pain    Time  8    Period  Weeks    Status  On-going      PT LONG TERM GOAL #4   Title  reach overhead with Rt UE without incresaed pain    Time  8    Period  Weeks    Status  On-going      PT LONG TERM GOAL #5   Title  stand for > or = to 45 minutes without limitation     Time  8    Period  Weeks    Status  On-going      PT LONG TERM GOAL #6   Title  report a 75% reduction in thoracic/lumbar pain with ADLs and self-care    Time  8    Period  Weeks    Status  On-going            Plan - 08/14/17 1713    Clinical Impression Statement  Pt. with primary complaint of mid and lower back pain today which she reports "is about the same" as last visit.  Does feel ice pack helped pain levels from last visit.  Treatment focusing on gentle lumbar/mid back ROM activities and  LE stretching with pt. tolerating well.  Trial of taping to lumbar spine applied today for hopeful decrease in tone and pain.  Ended treatment with ice/E-stim combo to R-sided thoracic spine over area of most pain with good relief following this.  Will monitor response to taping and progress as pt. able in coming visits.      PT Treatment/Interventions  ADLs/Self Care Home Management;Cryotherapy;Electrical Stimulation;Ultrasound;Moist Heat;Therapeutic activities;Therapeutic exercise;Functional mobility training;Neuromuscular re-education;Patient/family education;Passive range of motion;Manual techniques;Dry needling;Taping       Patient will benefit from skilled therapeutic intervention in order to improve the following deficits and impairments:  Impaired flexibility, Difficulty walking, Decreased  range of motion, Decreased strength, Impaired UE functional use, Increased muscle spasms, Pain, Decreased activity tolerance  Visit Diagnosis: Pain in thoracic spine  Acute bilateral low back pain without sciatica  Acute pain of right shoulder  Pain in right hip     Problem List Patient Active Problem List   Diagnosis Date Noted  . Hypokalemia 02/13/2017  . Chronic right hip pain 11/26/2015  . Hyperlipidemia 12/23/2010  . ALLERGIC RHINITIS CAUSE UNSPECIFIED 07/24/2008  . COUGH 02/26/2008  . ACUTE BRONCHITIS 01/20/2008  . VIRAL URI 12/03/2007  . ACUTE SINUSITIS, UNSPECIFIED 10/14/2007  . GERD 10/14/2007  . IBS 10/14/2007   Bess Harvest, PTA 08/14/17 5:19 PM  [facility] [address], [place], [place], [address] Phone: [phone]   Fax:  [phone]  Name: Brittany Rodgers MRN: [id number] Date of Birth: [birth year]

## 2017-08-14 NOTE — Telephone Encounter (Signed)
Copied from [place] [id number]. Topic: General - Other >> Aug 14, 2017  1:58 PM [place], Brittany Rodgers wrote: Reason for CRM: Pt was calling to confirm if the office recv'd a leave of absence paperwork from her job, they faxed it over on 08/13/17. Please call back to inform if the paperwork has or has not been recv'd.

## 2017-08-20 ENCOUNTER — Ambulatory Visit: Payer: BLUE CROSS/BLUE SHIELD

## 2017-08-20 DIAGNOSIS — M25551 Pain in right hip: Secondary | ICD-10-CM

## 2017-08-20 DIAGNOSIS — M546 Pain in thoracic spine: Secondary | ICD-10-CM | POA: Diagnosis not present

## 2017-08-20 DIAGNOSIS — M545 Low back pain, unspecified: Secondary | ICD-10-CM

## 2017-08-20 DIAGNOSIS — M25511 Pain in right shoulder: Secondary | ICD-10-CM

## 2017-08-20 NOTE — Patient Instructions (Addendum)
Piriformis Stretch, Sitting    Sit, one ankle on opposite knee, same-side hand on crossed knee. Push down on knee, keeping spine straight. Lean torso forward, with flat back, until tension is felt in hamstrings and gluteals of crossed-leg side. Hold _20 seconds.  Repeat _3__ times per session. Do __3_ sessions per day.  Copyright  VHI. All rights reserved.  Cervico-Thoracic: Extension / Rotation (Sitting)    Reach across body with left arm and grasp back of chair. Gently look over right side shoulder. Hold _20__ seconds. Relax. Repeat __3__ times per set. Do ____ sets per session. Do __3__ sessions per day.  http://orth.exer.us/981   Copyright  VHI. All rights reserved.  [facility] [address] [place],  [address] Phone # [phone] Fax [phone]

## 2017-08-20 NOTE — Therapy (Signed)
[facility] [address], [place], [place], [address] Phone: [phone]   Fax:  [phone]  Physical Therapy Treatment  Patient Details  Name: Brittany Rodgers MRN: [id number] Date of Birth: [birth year] Referring Provider: Alysia Penna, MD   Encounter Date: 08/20/2017  PT End of Session - 08/20/17 1231    Visit Number  4    Date for PT Re-Evaluation  10/03/17    PT Start Time  6606 late    PT Stop Time  3016    PT Time Calculation (min)  46 min    Activity Tolerance  Patient tolerated treatment well;No increased pain    Behavior During Therapy  WFL for tasks assessed/performed       Past Medical History:  Diagnosis Date  . Chronic cough   . GERD (gastroesophageal reflux disease)   . Hyperlipidemia   . IBS (irritable bowel syndrome)    sees Dr. Silvano Rusk   . Routine gynecological examination    sees Dr. Freda Munro    Past Surgical History:  Procedure Laterality Date  . CESAREAN SECTION    . ROOT CANAL      There were no vitals filed for this visit.  Subjective Assessment - 08/20/17 1159    Subjective  I can tell that I am getting better.  I cane move better.      Currently in Pain?  Yes    Pain Score  8     Pain Location  Back    Pain Orientation  Lower    Pain Descriptors / Indicators  Aching;Burning    Pain Type  Acute pain    Pain Onset  1 to 4 weeks ago    Pain Frequency  Constant    Aggravating Factors   repetative movement    Pain Relieving Factors  ice, muscle relaxers, change of position                      [facility] Adult PT Treatment/Exercise - 08/20/17 0001      Lumbar Exercises: Stretches   Active Hamstring Stretch  3 reps;20 seconds    Lower Trunk Rotation  3 reps;20 seconds    Piriformis Stretch  2 reps;30 seconds      Lumbar Exercises: Aerobic   Stationary Bike  --      Lumbar Exercises: Seated   Other Seated Lumbar Exercises  thoracolumbar rotation 3x20  seconds      Lumbar Exercises: Sidelying   Other Sidelying Lumbar Exercises  B open book stretch (without UE) 5" x 5 reps      Shoulder Exercises: Pulleys   Flexion  3 minutes    ABduction  3 minutes      Cryotherapy   Cryotherapy Location  Other (comment) thoracic     Type of Cryotherapy  Ice pack      Electrical Stimulation   Electrical Stimulation Location  R-sided thoracic spine     Electrical Stimulation Action  IFC    Electrical Stimulation Parameters  15 minutes    Electrical Stimulation Goals  Pain;Tone      Manual Therapy   Manual Therapy  -- taping not performed due to no kinesiotape    Kinesiotex  --      [employer]  --             PT Education - 08/20/17 1217    Education provided  Yes    Education Details  seated figure 4, thoracolumbar rotation in sitting    Person(s) Educated  Patient    Methods  Explanation;Handout;Demonstration    Comprehension  Verbalized understanding;Returned demonstration       PT [place] Term Goals - 08/20/17 1201      PT Suhr TERM GOAL #1   Title  be independent in initial HEP    Status  Achieved      PT Grewe TERM GOAL #2   Title  walk with dog for 15 minutes without increased LBP/thoracic pain    Time  4    Period  Weeks    Status  On-going      PT Parmar TERM GOAL #4   Title  report > or = to 30% reduction in thoracic/lumbar pain with work and home tasks    Time  4    Period  Weeks    Status  On-going        PT Long Term Goals - 08/14/17 1712      PT LONG TERM GOAL #1   Title  be independent in advanced HEP    Time  8    Period  Weeks    Status  On-going      PT LONG TERM GOAL #2   Title  reduce FOTO to < or = to 34% limitation    Time  8    Period  Weeks    Status  On-going      PT LONG TERM GOAL #3   Title  return to walking for exercise without limitation or increased pain    Time  8    Period  Weeks    Status  On-going      PT LONG TERM GOAL #4   Title  reach overhead with  Rt UE without incresaed pain    Time  8    Period  Weeks    Status  On-going      PT LONG TERM GOAL #5   Title  stand for > or = to 45 minutes without limitation     Time  8    Period  Weeks    Status  On-going      PT LONG TERM GOAL #6   Title  report a 75% reduction in thoracic/lumbar pain with ADLs and self-care    Time  8    Period  Weeks    Status  On-going            Plan - 08/20/17 1204    Clinical Impression Statement  Pt reports that she feels better yet denies reduction in the frequency and intensity of pain.  Pt reports that "it is easier to move".  Pt reports 8/10 pain today and is able to perform all exercises without difficulty today.  PT focused on gentle mobility today and pain management.  Pt will continue to benefit from skilled PT for flexibility, strength, and pain management.      Rehab Potential  Good    PT Frequency  3x / week    PT Duration  8 weeks    PT Treatment/Interventions  ADLs/Self Care Home Management;Cryotherapy;Electrical Stimulation;Ultrasound;Moist Heat;Therapeutic activities;Therapeutic exercise;Functional mobility training;Neuromuscular re-education;Patient/family education;Passive range of motion;Manual techniques;Dry needling;Taping    PT Next Visit Plan  gentle flexibility for Rt shoulder, lumbar and thoracic spine, continue kinesiotape    Recommended Other Services  initial certification is signed    Consulted and Agree with Plan of Care  Patient  Patient will benefit from skilled therapeutic intervention in order to improve the following deficits and impairments:  Impaired flexibility, Difficulty walking, Decreased range of motion, Decreased strength, Impaired UE functional use, Increased muscle spasms, Pain, Decreased activity tolerance  Visit Diagnosis: Pain in thoracic spine  Acute bilateral low back pain without sciatica  Acute pain of right shoulder  Pain in right hip     Problem List Patient Active Problem List    Diagnosis Date Noted  . Hypokalemia 02/13/2017  . Chronic right hip pain 11/26/2015  . Hyperlipidemia 12/23/2010  . ALLERGIC RHINITIS CAUSE UNSPECIFIED 07/24/2008  . COUGH 02/26/2008  . ACUTE BRONCHITIS 01/20/2008  . VIRAL URI 12/03/2007  . ACUTE SINUSITIS, UNSPECIFIED 10/14/2007  . GERD 10/14/2007  . IBS 10/14/2007     Sigurd Sos, PT 08/20/17 12:34 PM  [facility] [address] [place], [place], [address] Phone: [phone]   Fax:  [phone]  Name: Brittany Rodgers MRN: [id number] Date of Birth: 1970/10/24

## 2017-08-22 ENCOUNTER — Ambulatory Visit: Payer: BLUE CROSS/BLUE SHIELD

## 2017-08-22 DIAGNOSIS — M545 Low back pain, unspecified: Secondary | ICD-10-CM

## 2017-08-22 DIAGNOSIS — M546 Pain in thoracic spine: Secondary | ICD-10-CM | POA: Diagnosis not present

## 2017-08-22 DIAGNOSIS — M25551 Pain in right hip: Secondary | ICD-10-CM

## 2017-08-22 DIAGNOSIS — M25511 Pain in right shoulder: Secondary | ICD-10-CM

## 2017-08-22 NOTE — Therapy (Signed)
[facility] [address] [place], [place], [address] Phone: [phone]   Fax:  [phone]  Physical Therapy Treatment  Patient Details  Name: Brittany Rodgers MRN: [id number] Date of Birth: [birth year] Referring Provider: Alysia Penna, MD   Encounter Date: 08/22/2017  PT End of Session - 08/22/17 1613    Visit Number  5    Date for PT Re-Evaluation  10/03/17    PT Start Time  1525    PT Stop Time  1623    PT Time Calculation (min)  58 min    Activity Tolerance  Patient tolerated treatment well;No increased pain       Past Medical History:  Diagnosis Date  . Chronic cough   . GERD (gastroesophageal reflux disease)   . Hyperlipidemia   . IBS (irritable bowel syndrome)    sees Dr. Silvano Rusk   . Routine gynecological examination    sees Dr. Freda Munro    Past Surgical History:  Procedure Laterality Date  . CESAREAN SECTION    . ROOT CANAL      There were no vitals filed for this visit.  Subjective Assessment - 08/22/17 1530    Subjective  I was pressing to get here.  No change.      Currently in Pain?  Yes    Pain Score  8     Pain Location  Back    Pain Orientation  Lower;Mid;Right    Pain Descriptors / Indicators  Aching;Burning    Pain Type  Acute pain                      [facility] Adult PT Treatment/Exercise - 08/22/17 0001      Lumbar Exercises: Stretches   Active Hamstring Stretch  3 reps;20 seconds    Piriformis Stretch  2 reps;30 seconds      Lumbar Exercises: Aerobic   Stationary Bike  Nustep: lvl 1, 6 min      Lumbar Exercises: Seated   Other Seated Lumbar Exercises  thoracolumbar rotation 3x20 seconds      Lumbar Exercises: Sidelying   Other Sidelying Lumbar Exercises  B open book stretch (without UE) 5" x 5 reps      Lumbar Exercises: Quadruped   Madcat/Old Horse  10 reps      Shoulder Exercises: Supine   Horizontal ABduction  Strengthening;Both;20  reps;Theraband    Theraband Level (Shoulder Horizontal ABduction)  Level 1 (Yellow)    External Rotation  Strengthening;Both;20 reps;Theraband      Shoulder Exercises: Pulleys   Flexion  3 minutes    ABduction  3 minutes      Cryotherapy   Cryotherapy Location  Other (comment)    Type of Cryotherapy  Ice pack      Electrical Stimulation   Electrical Stimulation Location  R-sided thoracic spine     Electrical Stimulation Action  IFC    Electrical Stimulation Parameters  15 minutes    Electrical Stimulation Goals  Pain      Manual Therapy   Manual Therapy  --    Kinesiotex  --      Surveyor, quantity  -- not done: pt wearing many tight layers. Not able to access                PT Pandit Term Goals - 08/20/17 1201      PT Roll[place] TERM GOAL #1   Title  be independent in  initial HEP    Status  Achieved      PT Verde TERM GOAL #2   Title  walk with dog for 15 minutes without increased LBP/thoracic pain    Time  4    Period  Weeks    Status  On-going      PT Reza TERM GOAL #4   Title  report > or = to 30% reduction in thoracic/lumbar pain with work and home tasks    Time  4    Period  Weeks    Status  On-going        PT Long Term Goals - 08/14/17 1712      PT LONG TERM GOAL #1   Title  be independent in advanced HEP    Time  8    Period  Weeks    Status  On-going      PT LONG TERM GOAL #2   Title  reduce FOTO to < or = to 34% limitation    Time  8    Period  Weeks    Status  On-going      PT LONG TERM GOAL #3   Title  return to walking for exercise without limitation or increased pain    Time  8    Period  Weeks    Status  On-going      PT LONG TERM GOAL #4   Title  reach overhead with Rt UE without incresaed pain    Time  8    Period  Weeks    Status  On-going      PT LONG TERM GOAL #5   Title  stand for > or = to 45 minutes without limitation     Time  8    Period  Weeks    Status  On-going      PT LONG TERM GOAL #6   Title   report a 75% reduction in thoracic/lumbar pain with ADLs and self-care    Time  8    Period  Weeks    Status  On-going            Plan - 08/22/17 1538    Clinical Impression Statement  Pt reports that she hasn't had much change in pain but feels less stiff.  Pt continues to report 8/10 lumbar pain.  Pt tolerated all exercise in the clinic today.  PT has focused on gentle mobility, taping and modalities today.  Pt will continue to benefit from skilled PT for flexibility, strenth and pain management.      Rehab Potential  Good    PT Frequency  3x / week    PT Duration  8 weeks    PT Treatment/Interventions  ADLs/Self Care Home Management;Cryotherapy;Electrical Stimulation;Ultrasound;Moist Heat;Therapeutic activities;Therapeutic exercise;Functional mobility training;Neuromuscular re-education;Patient/family education;Passive range of motion;Manual techniques;Dry needling;Taping    PT Next Visit Plan  gentle flexibility for Rt shoulder, lumbar and thoracic spine, continue kinesiotape if area is accessible       Patient will benefit from skilled therapeutic intervention in order to improve the following deficits and impairments:  Impaired flexibility, Difficulty walking, Decreased range of motion, Decreased strength, Impaired UE functional use, Increased muscle spasms, Pain, Decreased activity tolerance  Visit Diagnosis: Pain in thoracic spine  Acute bilateral low back pain without sciatica  Acute pain of right shoulder  Pain in right hip     Problem List Patient Active Problem List   Diagnosis Date Noted  . Hypokalemia 02/13/2017  .  Chronic right hip pain 11/26/2015  . Hyperlipidemia 12/23/2010  . ALLERGIC RHINITIS CAUSE UNSPECIFIED 07/24/2008  . COUGH 02/26/2008  . ACUTE BRONCHITIS 01/20/2008  . VIRAL URI 12/03/2007  . ACUTE SINUSITIS, UNSPECIFIED 10/14/2007  . GERD 10/14/2007  . IBS 10/14/2007     Sigurd Sos, PT 08/22/17 4:18 PM  [facility] [address] [place], [place], [address] Phone: [phone]   Fax:  [phone]  Name: Brittany Rodgers MRN: [id number] Date of Birth: Dec 15, 1970

## 2017-08-23 NOTE — Telephone Encounter (Signed)
Patient calling to check on status of paperwork. Please call patient to let her know if paperwork is complete.

## 2017-08-24 ENCOUNTER — Ambulatory Visit: Payer: BLUE CROSS/BLUE SHIELD | Admitting: Physical Therapy

## 2017-08-24 DIAGNOSIS — M25511 Pain in right shoulder: Secondary | ICD-10-CM

## 2017-08-24 DIAGNOSIS — M546 Pain in thoracic spine: Secondary | ICD-10-CM

## 2017-08-24 DIAGNOSIS — M25551 Pain in right hip: Secondary | ICD-10-CM

## 2017-08-24 DIAGNOSIS — M545 Low back pain, unspecified: Secondary | ICD-10-CM

## 2017-08-24 NOTE — Telephone Encounter (Signed)
Yes we do have the form however, the provider is unable to fill out the form without an OV with the pt. Call pt to schedule an appt.

## 2017-08-24 NOTE — Therapy (Signed)
[facility] [address], [place], [place], [address] Phone: [phone]   Fax:  [phone]  Physical Therapy Treatment  Patient Details  Name: Brittany Rodgers MRN: [id number] Date of Birth: [birth year] Referring Provider: Alysia Penna, MD   Encounter Date: 08/24/2017  PT End of Session - 08/24/17 1142    Visit Number  6    Date for PT Re-Evaluation  10/03/17    PT Start Time  1102    PT Stop Time  1152    PT Time Calculation (min)  50 min    Activity Tolerance  Patient tolerated treatment well       Past Medical History:  Diagnosis Date  . Chronic cough   . GERD (gastroesophageal reflux disease)   . Hyperlipidemia   . IBS (irritable bowel syndrome)    sees Dr. Silvano Rusk   . Routine gynecological examination    sees Dr. Freda Munro    Past Surgical History:  Procedure Laterality Date  . CESAREAN SECTION    . ROOT CANAL      There were no vitals filed for this visit.  Subjective Assessment - 08/24/17 1101    Subjective  Therapy is helping.  Stretching, electrical stim and cold helps.      Currently in Pain?  Yes    Pain Score  8     Pain Location  Scapula    Pain Orientation  Right    Pain Type  Acute pain    Pain Radiating Towards  right flank    Aggravating Factors   reaching, grabbing, overhead         [facility] PT Assessment - 08/24/17 0001      AROM   Overall AROM Comments  bil shoulder flexion 150 degrees and painful; lumbar flexion 20 degrees pre treat, 55 degrees post treatment                  [facility] Adult PT Treatment/Exercise - 08/24/17 0001      Lumbar Exercises: Stretches   Quadruped Mid Back Stretch  60 seconds childs pose      Lumbar Exercises: Aerobic   Stationary Bike  Nustep L1 5 min seat 7      Shoulder Exercises: ROM/Strengthening   Other ROM/Strengthening Exercises  UE ranger on wall L5 10x right/left    Other ROM/Strengthening Exercises  Psoas doorway  stretch 3x5 right/left      Cryotherapy   Cryotherapy Location  Other (comment)    Type of Cryotherapy  Ice pack      Electrical Stimulation   Electrical Stimulation Location  R-sided thoracic spine and right lumbar    Electrical Stimulation Action  IFC    Electrical Stimulation Parameters  15 min supine 9 ma    Electrical Stimulation Goals  Pain      Manual Therapy   Soft tissue mobilization  Graston sweeping over clothing thoraco-lumbar, lats in prone and child's pose      Kinesiotix   Create Space  -- unable to access secondary to clothing               PT [place] Term Goals - 08/20/17 1201      PT [place] TERM GOAL #1   Title  be independent in initial HEP    Status  Achieved      PT [place] TERM GOAL #2   Title  walk with dog for 15 minutes without increased LBP/thoracic pain    Time  4    Period  Weeks    Status  On-going      PT [place] TERM GOAL #4   Title  report > or = to 30% reduction in thoracic/lumbar pain with work and home tasks    Time  4    Period  Weeks    Status  On-going        PT Long Term Goals - 08/14/17 1712      PT LONG TERM GOAL #1   Title  be independent in advanced HEP    Time  8    Period  Weeks    Status  On-going      PT LONG TERM GOAL #2   Title  reduce FOTO to < or = to 34% limitation    Time  8    Period  Weeks    Status  On-going      PT LONG TERM GOAL #3   Title  return to walking for exercise without limitation or increased pain    Time  8    Period  Weeks    Status  On-going      PT LONG TERM GOAL #4   Title  reach overhead with Rt UE without incresaed pain    Time  8    Period  Weeks    Status  On-going      PT LONG TERM GOAL #5   Title  stand for > or = to 45 minutes without limitation     Time  8    Period  Weeks    Status  On-going      PT LONG TERM GOAL #6   Title  report a 75% reduction in thoracic/lumbar pain with ADLs and self-care    Time  8    Period  Weeks    Status  On-going             Plan - 08/24/17 1142    Clinical Impression Statement  The patient reports she is feeling better although she rates her pain intensity still at an "8".  She continues to have pain limited shoulder ROM and today very guarded lumbar flexion.  Initially with toe touch test, she is only able to flex 20 degrees.  After instrument assisted manual therapy, she is able to flex forward 55 degrees.  Encouraged patient to not wear her  full torso compression undergarments to therapy to optimize full benefit of manual therapy including taping.  Good pain relief from electrical stimulation and cold pack.      Rehab Potential  Good    PT Frequency  3x / week    PT Duration  8 weeks    PT Treatment/Interventions  ADLs/Self Care Home Management;Cryotherapy;Electrical Stimulation;Ultrasound;Moist Heat;Therapeutic activities;Therapeutic exercise;Functional mobility training;Neuromuscular re-education;Patient/family education;Passive range of motion;Manual techniques;Dry needling;Taping    PT Next Visit Plan  gentle flexibility for Rt shoulder, lumbar and thoracic spine, functional reactivation;  continue kinesiotape and instrument assisted manual  if area is accessible;   modalities for pain control       Patient will benefit from skilled therapeutic intervention in order to improve the following deficits and impairments:  Impaired flexibility, Difficulty walking, Decreased range of motion, Decreased strength, Impaired UE functional use, Increased muscle spasms, Pain, Decreased activity tolerance  Visit Diagnosis: Pain in thoracic spine  Acute bilateral low back pain without sciatica  Acute pain of right shoulder  Pain in right hip     Problem List Patient Active  Problem List   Diagnosis Date Noted  . Hypokalemia 02/13/2017  . Chronic right hip pain 11/26/2015  . Hyperlipidemia 12/23/2010  . ALLERGIC RHINITIS CAUSE UNSPECIFIED 07/24/2008  . COUGH 02/26/2008  . ACUTE BRONCHITIS  01/20/2008  . VIRAL URI 12/03/2007  . ACUTE SINUSITIS, UNSPECIFIED 10/14/2007  . GERD 10/14/2007  . IBS 10/14/2007   Ruben Im, PT 08/24/17 11:50 AM Phone: [phone] Fax: [phone]  Alvera Singh 08/24/2017, 11:50 AM  [facility] [address], [place], [place], [address] Phone: [phone]   Fax:  [phone]  Name: Brittany Rodgers MRN: [id number] Date of Birth: 1971-04-08

## 2017-08-27 ENCOUNTER — Ambulatory Visit: Payer: BLUE CROSS/BLUE SHIELD | Attending: Family Medicine

## 2017-08-27 DIAGNOSIS — M545 Low back pain, unspecified: Secondary | ICD-10-CM

## 2017-08-27 DIAGNOSIS — M25511 Pain in right shoulder: Secondary | ICD-10-CM | POA: Diagnosis present

## 2017-08-27 DIAGNOSIS — M25551 Pain in right hip: Secondary | ICD-10-CM | POA: Diagnosis present

## 2017-08-27 DIAGNOSIS — M546 Pain in thoracic spine: Secondary | ICD-10-CM | POA: Insufficient documentation

## 2017-08-27 NOTE — Therapy (Signed)
[facility] [address], [place], [place], [address] Phone: [phone]   Fax:  [phone]  Physical Therapy Treatment  Patient Details  Name: Brittany Rodgers MRN: [id number] Date of Birth: [birth year] Referring Provider: Alysia Penna, MD   Encounter Date: 08/27/2017  PT End of Session - 08/27/17 0840    Visit Number  7    Date for PT Re-Evaluation  10/03/17    PT Start Time  0806    PT Stop Time  0900    PT Time Calculation (min)  54 min    Activity Tolerance  Patient tolerated treatment well    Behavior During Therapy  [facility] for tasks assessed/performed       Past Medical History:  Diagnosis Date  . Chronic cough   . GERD (gastroesophageal reflux disease)   . Hyperlipidemia   . IBS (irritable bowel syndrome)    sees Dr. Silvano Rusk   . Routine gynecological examination    sees Dr. Freda Munro    Past Surgical History:  Procedure Laterality Date  . CESAREAN SECTION    . ROOT CANAL      There were no vitals filed for this visit.  Subjective Assessment - 08/27/17 0813    Subjective  I am doing better.  I am able to do more around the house.      Currently in Pain?  Yes    Pain Score  8     Pain Location  Thoracic    Pain Orientation  Right    Pain Descriptors / Indicators  Aching;Burning    Pain Type  Acute pain    Pain Onset  1 to 4 weeks ago    Pain Frequency  Constant    Aggravating Factors   activity    Pain Relieving Factors  ice, muscle relaxers, change of position                      [facility] Adult PT Treatment/Exercise - 08/27/17 0001      Lumbar Exercises: Stretches   Active Hamstring Stretch  3 reps;20 seconds      Lumbar Exercises: Aerobic   Stationary Bike  Nustep L1 5 min seat 7 PT present to discuss progress      Lumbar Exercises: Sidelying   Other Sidelying Lumbar Exercises  B open book stretch (without UE) 5" x 5 reps      Shoulder Exercises: Supine    Horizontal ABduction  Strengthening;Both;20 reps;Theraband    Theraband Level (Shoulder Horizontal ABduction)  Level 1 (Yellow)    External Rotation  Strengthening;Both;20 reps;Theraband      Shoulder Exercises: Standing   Other Standing Exercises  ball roll on mat table: x10 flexion      Shoulder Exercises: ROM/Strengthening   Other ROM/Strengthening Exercises  UE ranger on wall L5 10x right abduction and flexion      Cryotherapy   Cryotherapy Location  Other (comment)    Type of Cryotherapy  Ice pack      Electrical Stimulation   Electrical Stimulation Location  R-sided thoracic spine and right lumbar    Electrical Stimulation Action  IFC    Electrical Stimulation Parameters  15 minutes    Electrical Stimulation Goals  Pain               PT [place] Term Goals - 08/27/17 0816      PT Patient TERM GOAL #3   Title  demonstrate symmetry with ambulation  due to reduced Rt hip pain    Status  Achieved      PT [place] TERM GOAL #4   Title  report > or = to 30% reduction in thoracic/lumbar pain with work and home tasks    Status  Achieved        PT Long Term Goals - 08/14/17 1712      PT LONG TERM GOAL #1   Title  be independent in advanced HEP    Time  8    Period  Weeks    Status  On-going      PT LONG TERM GOAL #2   Title  reduce FOTO to < or = to 34% limitation    Time  8    Period  Weeks    Status  On-going      PT LONG TERM GOAL #3   Title  return to walking for exercise without limitation or increased pain    Time  8    Period  Weeks    Status  On-going      PT LONG TERM GOAL #4   Title  reach overhead with Rt UE without incresaed pain    Time  8    Period  Weeks    Status  On-going      PT LONG TERM GOAL #5   Title  stand for > or = to 45 minutes without limitation     Time  8    Period  Weeks    Status  On-going      PT LONG TERM GOAL #6   Title  report a 75% reduction in thoracic/lumbar pain with ADLs and self-care    Time  8    Period  Weeks     Status  On-going            Plan - 08/27/17 0818    Clinical Impression Statement  Pt reports that she is feeling better and is able to do more housework including laundry.  Pt continues to rate pain as 8/10 because she is still having pain.  Pt with improved A/ROM of the Rt UE.  Pt with continued guarded movement with PT and this is improving with flexibility and mobility progression exercises.  Pt will continue to benefit from skilled PT for flexibility, gentle strength, mobility progression and modalities for pain.      Rehab Potential  Good    PT Frequency  3x / week    PT Duration  8 weeks    PT Treatment/Interventions  ADLs/Self Care Home Management;Cryotherapy;Electrical Stimulation;Ultrasound;Moist Heat;Therapeutic activities;Therapeutic exercise;Functional mobility training;Neuromuscular re-education;Patient/family education;Passive range of motion;Manual techniques;Dry needling;Taping    PT Next Visit Plan  gentle flexibility for Rt shoulder, lumbar and thoracic spine, functional reactivation;  continue kinesiotape and instrument assisted manual  if area is accessible;   modalities for pain control    Consulted and Agree with Plan of Care  Patient       Patient will benefit from skilled therapeutic intervention in order to improve the following deficits and impairments:  Impaired flexibility, Difficulty walking, Decreased range of motion, Decreased strength, Impaired UE functional use, Increased muscle spasms, Pain, Decreased activity tolerance  Visit Diagnosis: Pain in thoracic spine  Acute bilateral low back pain without sciatica  Acute pain of right shoulder  Pain in right hip     Problem List Patient Active Problem List   Diagnosis Date Noted  . Hypokalemia 02/13/2017  . Chronic right hip pain 11/26/2015  .  Hyperlipidemia 12/23/2010  . ALLERGIC RHINITIS CAUSE UNSPECIFIED 07/24/2008  . COUGH 02/26/2008  . ACUTE BRONCHITIS 01/20/2008  . VIRAL URI 12/03/2007   . ACUTE SINUSITIS, UNSPECIFIED 10/14/2007  . GERD 10/14/2007  . IBS 10/14/2007   Sigurd Sos, PT 08/27/17 8:55 AM  [facility] [address] [place], [place], [address] Phone: [phone]   Fax:  [phone]  Name: Brittany Rodgers MRN: [id number] Date of Birth: 24-Feb-1971

## 2017-08-27 NOTE — Telephone Encounter (Signed)
Pt is scheduled to be seen on 08/28/2017

## 2017-08-28 ENCOUNTER — Encounter: Payer: Self-pay | Admitting: Family Medicine

## 2017-08-28 ENCOUNTER — Ambulatory Visit (INDEPENDENT_AMBULATORY_CARE_PROVIDER_SITE_OTHER): Payer: BLUE CROSS/BLUE SHIELD | Admitting: Family Medicine

## 2017-08-28 VITALS — BP 136/80 | HR 75 | Temp 98.7°F | Wt 157.0 lb

## 2017-08-28 DIAGNOSIS — M545 Low back pain, unspecified: Secondary | ICD-10-CM

## 2017-08-28 DIAGNOSIS — M549 Dorsalgia, unspecified: Secondary | ICD-10-CM | POA: Diagnosis not present
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
## 2017-08-29 ENCOUNTER — Ambulatory Visit: Payer: BLUE CROSS/BLUE SHIELD | Admitting: Physical Therapy

## 2017-08-29 DIAGNOSIS — M25551 Pain in right hip: Secondary | ICD-10-CM

## 2017-08-29 DIAGNOSIS — M546 Pain in thoracic spine: Secondary | ICD-10-CM

## 2017-08-29 DIAGNOSIS — M545 Low back pain, unspecified: Secondary | ICD-10-CM

## 2017-08-29 DIAGNOSIS — M25511 Pain in right shoulder: Secondary | ICD-10-CM

## 2017-08-29 NOTE — Therapy (Signed)
[facility] [address], [place], [place], [address] Phone: [phone]   Fax:  [phone]  Physical Therapy Treatment  Patient Details  Name: Brittany Rodgers MRN: [id number] Date of Birth: 1971/06/09 Referring Provider: Alysia Penna, MD   Encounter Date: 08/29/2017  PT End of Session - 08/29/17 1221    Visit Number  8    Date for PT Re-Evaluation  10/03/17    PT Start Time  1150    PT Stop Time  7353    PT Time Calculation (min)  55 min    Activity Tolerance  Patient tolerated treatment well;No increased pain    Behavior During Therapy  WFL for tasks assessed/performed       Past Medical History:  Diagnosis Date  . Chronic cough   . GERD (gastroesophageal reflux disease)   . Hyperlipidemia   . IBS (irritable bowel syndrome)    sees Dr. Silvano Rusk   . Routine gynecological examination    sees Dr. Freda Munro    Past Surgical History:  Procedure Laterality Date  . CESAREAN SECTION    . ROOT CANAL      There were no vitals filed for this visit.  Subjective Assessment - 08/29/17 1153    Subjective  Pt reports that things are going good.     Currently in Pain?  Yes    Pain Score  7     Pain Location  Other (Comment) thoracic/lumbar/shoulder region     Pain Orientation  Right    Pain Descriptors / Indicators  Aching    Pain Type  Acute pain    Pain Radiating Towards  none     Pain Onset  1 to 4 weeks ago    Pain Frequency  Constant    Aggravating Factors   activity     Pain Relieving Factors  ice/muscle relaxers    Effect of Pain on Daily Activities  unable to complete full activity                       [facility] Adult PT Treatment/Exercise - 08/29/17 0001      Lumbar Exercises: Sidelying   Other Sidelying Lumbar Exercises  thoracic rotation open book stretch x15 reps each direction       Shoulder Exercises: Supine   Horizontal ABduction  Strengthening;Both;Theraband;10 reps    Theraband Level (Shoulder Horizontal ABduction)  Level 2 (Red)    Other Supine Exercises  BUE chops each direction with red TB x10 reps; BUE lifts with red TB x10 reps each       Shoulder Exercises: Seated   Other Seated Exercises  anterior/posterior pelvic tilts       Shoulder Exercises: ROM/Strengthening   Other ROM/Strengthening Exercises  UE ranger on wall L5 15x right abduction and flexion, therapist assistance            PT Education - 08/29/17 1221    Education provided  Yes    Education Details  seated pelvic tilts and implications for these    Person(s) Educated  Patient    Methods  Explanation;Demonstration    Comprehension  Verbalized understanding;Returned demonstration       PT [place] Term Goals - 08/27/17 0816      PT Alyea TERM GOAL #3   Title  demonstrate symmetry with ambulation due to reduced Rt hip pain    Status  Achieved      PT [place] TERM GOAL #4  Title  report > or = to 30% reduction in thoracic/lumbar pain with work and home tasks    Status  Achieved        PT Long Term Goals - 08/14/17 [place] #1   Title  be independent in advanced HEP    Time  8    Period  Weeks    Status  On-going      PT LONG TERM GOAL #2   Title  reduce FOTO to < or = to 34% limitation    Time  8    Period  Weeks    Status  On-going      PT LONG TERM GOAL #3   Title  return to walking for exercise without limitation or increased pain    Time  8    Period  Weeks    Status  On-going      PT LONG TERM GOAL #4   Title  reach overhead with Rt UE without incresaed pain    Time  8    Period  Weeks    Status  On-going      PT LONG TERM GOAL #5   Title  stand for > or = to 45 minutes without limitation     Time  8    Period  Weeks    Status  On-going      PT LONG TERM GOAL #6   Title  report a 75% reduction in thoracic/lumbar pain with ADLs and self-care    Time  8    Period  Weeks    Status  On-going            Plan - 08/29/17  1326    Clinical Impression Statement  Today's session continued with therex to address lumbar and thoracic mobility. Introduced chops and lifts this session, noting improved shoulder elevation with therapist providing scapular assistance on the UE ranger, and pt reporting decrease in her pain with this as well. Pt reported no increase in her pain during or following today's session. Will continue with current POC.     Rehab Potential  Good    PT Frequency  3x / week    PT Duration  8 weeks    PT Treatment/Interventions  ADLs/Self Care Home Management;Cryotherapy;Electrical Stimulation;Ultrasound;Moist Heat;Therapeutic activities;Therapeutic exercise;Functional mobility training;Neuromuscular re-education;Patient/family education;Passive range of motion;Manual techniques;Dry needling;Taping    PT Next Visit Plan  gentle flexibility for Rt shoulder, lumbar and thoracic spine, functional reactivation;  continue kinesiotape and instrument assisted manual  if area is accessible;   modalities for pain control    Consulted and Agree with Plan of Care  Patient       Patient will benefit from skilled therapeutic intervention in order to improve the following deficits and impairments:  Impaired flexibility, Difficulty walking, Decreased range of motion, Decreased strength, Impaired UE functional use, Increased muscle spasms, Pain, Decreased activity tolerance  Visit Diagnosis: Pain in thoracic spine  Acute bilateral low back pain without sciatica  Acute pain of right shoulder  Pain in right hip     Problem List Patient Active Problem List   Diagnosis Date Noted  . Hypokalemia 02/13/2017  . Chronic right hip pain 11/26/2015  . Hyperlipidemia 12/23/2010  . ALLERGIC RHINITIS CAUSE UNSPECIFIED 07/24/2008  . COUGH 02/26/2008  . ACUTE BRONCHITIS 01/20/2008  . VIRAL URI 12/03/2007  . ACUTE SINUSITIS, UNSPECIFIED 10/14/2007  . GERD 10/14/2007  . IBS 10/14/2007    1:31  PM,08/29/17 [place] Hardigree Kiser  PT, [facility]  [place] [address], [place], [place], [address] Phone: [phone]   Fax:  [phone]  Name: Brittany Rodgers MRN: [id number] Date of Birth: 1970-12-05

## 2017-08-31 ENCOUNTER — Ambulatory Visit: Payer: BLUE CROSS/BLUE SHIELD | Admitting: Physical Therapy

## 2017-08-31 DIAGNOSIS — M545 Low back pain, unspecified: Secondary | ICD-10-CM

## 2017-08-31 DIAGNOSIS — M546 Pain in thoracic spine: Secondary | ICD-10-CM | POA: Diagnosis not present

## 2017-08-31 DIAGNOSIS — M25511 Pain in right shoulder: Secondary | ICD-10-CM

## 2017-08-31 DIAGNOSIS — M25551 Pain in right hip: Secondary | ICD-10-CM

## 2017-08-31 NOTE — Therapy (Signed)
[facility] [address] [place], [place], [address] Phone: [phone]   Fax:  [phone]  Physical Therapy Treatment  Patient Details  Name: Brittany Rodgers MRN: [id number] Date of Birth: [birth year] Referring Provider: Alysia Penna, MD   Encounter Date: 08/31/2017  PT End of Session - 08/31/17 0842    Visit Number  9    Date for PT Re-Evaluation  10/03/17    PT Start Time  0815    PT Stop Time  0849 10 min untimed modality    PT Time Calculation (min)  34 min    Activity Tolerance  Patient tolerated treatment well;No increased pain    Behavior During Therapy  WFL for tasks assessed/performed       Past Medical History:  Diagnosis Date  . Chronic cough   . GERD (gastroesophageal reflux disease)   . Hyperlipidemia   . IBS (irritable bowel syndrome)    sees Dr. Silvano Rusk   . Routine gynecological examination    sees Dr. Freda Munro    Past Surgical History:  Procedure Laterality Date  . CESAREAN SECTION    . ROOT CANAL      There were no vitals filed for this visit.  Subjective Assessment - 08/31/17 0816    Subjective  Pt reports that things are going well.  She says her Rt shoulder is doing much better, but currently her low back is stiff.     Currently in Pain?  No/denies    Pain Onset  1 to 4 weeks ago                      [facility] Adult PT Treatment/Exercise - 08/31/17 0001      Lumbar Exercises: Stretches   Single Knee to Chest Stretch  5 reps;10 seconds each LE      Lumbar Exercises: Seated   Other Seated Lumbar Exercises  trunk rotation Lt/Rt x15 reps; thoracic lateral flexion x15 reps Lt/Rt; flexion/extension x15 reps       Lumbar Exercises: Supine   Other Supine Lumbar Exercises  low trunk rotation with LEs on ball x20 reps with therapist assistance       Manual Therapy   Soft tissue mobilization  STM Latissimus and lumbar paraspinals              PT  Education - 08/31/17 0848    Education provided  Yes    Education Details  importance of focusing on smooth movements and calmed breathing to improve relaxation     Person(s) Educated  Patient    Methods  Explanation    Comprehension  Verbalized understanding       PT Cordial Term Goals - 08/27/17 0816      PT Stauch TERM GOAL #3   Title  demonstrate symmetry with ambulation due to reduced Rt hip pain    Status  Achieved      PT Boardley TERM GOAL #4   Title  report > or = to 30% reduction in thoracic/lumbar pain with work and home tasks    Status  Achieved        PT Long Term Goals - 08/14/17 1712      PT LONG TERM GOAL #1   Title  be independent in advanced HEP    Time  8    Period  Weeks    Status  On-going      PT LONG TERM GOAL #2   Title  reduce FOTO to < or = to 34% limitation    Time  8    Period  Weeks    Status  On-going      PT LONG TERM GOAL #3   Title  return to walking for exercise without limitation or increased pain    Time  8    Period  Weeks    Status  On-going      PT LONG TERM GOAL #4   Title  reach overhead with Rt UE without incresaed pain    Time  8    Period  Weeks    Status  On-going      PT LONG TERM GOAL #5   Title  stand for > or = to 45 minutes without limitation     Time  8    Period  Weeks    Status  On-going      PT LONG TERM GOAL #6   Title  report a 75% reduction in thoracic/lumbar pain with ADLs and self-care    Time  8    Period  Weeks    Status  On-going            Plan - 08/31/17 1308    Clinical Impression Statement  Pt arrived late to today's appointment, but reported improved Rt shoulder/thoracic pain and stiffness this session. Although no pain upon arrival, she reports that her Rt shoulder is about 30% improved overall. Session focused on the lumbar spine this session with gentle stretching and active ROM within pain free limits. Pt demonstrated no signs of increased pain this session and reported no increase in  stiffness by the end. Will continue with current POC.     Rehab Potential  Good    PT Frequency  3x / week    PT Duration  8 weeks    PT Treatment/Interventions  ADLs/Self Care Home Management;Cryotherapy;Electrical Stimulation;Ultrasound;Moist Heat;Therapeutic activities;Therapeutic exercise;Functional mobility training;Neuromuscular re-education;Patient/family education;Passive range of motion;Manual techniques;Dry needling;Taping    PT Next Visit Plan  gentle flexibility for Rt shoulder, lumbar and thoracic spine, functional reactivation;  continue kinesiotape and instrument assisted manual  if area is accessible;   modalities for pain control    Consulted and Agree with Plan of Care  Patient       Patient will benefit from skilled therapeutic intervention in order to improve the following deficits and impairments:  Impaired flexibility, Difficulty walking, Decreased range of motion, Decreased strength, Impaired UE functional use, Increased muscle spasms, Pain, Decreased activity tolerance  Visit Diagnosis: Pain in thoracic spine  Acute bilateral low back pain without sciatica  Acute pain of right shoulder  Pain in right hip     Problem List Patient Active Problem List   Diagnosis Date Noted  . Hypokalemia 02/13/2017  . Chronic right hip pain 11/26/2015  . Hyperlipidemia 12/23/2010  . ALLERGIC RHINITIS CAUSE UNSPECIFIED 07/24/2008  . COUGH 02/26/2008  . ACUTE BRONCHITIS 01/20/2008  . VIRAL URI 12/03/2007  . ACUTE SINUSITIS, UNSPECIFIED 10/14/2007  . GERD 10/14/2007  . IBS 10/14/2007   8:49 AM,08/31/17 Elly Modena PT, DPT [facility] [address] [place], [place], [address] Phone: [phone]   Fax:  [phone]  Name: Brittany Rodgers MRN: [id number] Date of Birth: 09-06-1971

## 2017-09-06 ENCOUNTER — Ambulatory Visit: Payer: BLUE CROSS/BLUE SHIELD | Admitting: Physical Therapy

## 2017-09-06 ENCOUNTER — Encounter: Payer: Self-pay | Admitting: Physical Therapy

## 2017-09-06 DIAGNOSIS — M25511 Pain in right shoulder: Secondary | ICD-10-CM

## 2017-09-06 DIAGNOSIS — M546 Pain in thoracic spine: Secondary | ICD-10-CM

## 2017-09-06 DIAGNOSIS — M545 Low back pain, unspecified: Secondary | ICD-10-CM

## 2017-09-06 DIAGNOSIS — M25551 Pain in right hip: Secondary | ICD-10-CM

## 2017-09-06 NOTE — Therapy (Signed)
[facility] [address], [place], [place], [address] Phone: [phone]   Fax:  [phone]  Physical Therapy Treatment  Patient Details  Name: Brittany Rodgers MRN: [id number] Date of Birth: Jun 26, 1971 Referring Provider: Alysia Penna, MD   Encounter Date: 09/06/2017  PT End of Session - 09/06/17 1356    Visit Number  10    Date for PT Re-Evaluation  10/03/17    Authorization Type  BCBS    PT Start Time  2831    PT Stop Time  5176    PT Time Calculation (min)  60 min    Activity Tolerance  Patient tolerated treatment well;No increased pain    Behavior During Therapy  WFL for tasks assessed/performed       Past Medical History:  Diagnosis Date  . Chronic cough   . GERD (gastroesophageal reflux disease)   . Hyperlipidemia   . IBS (irritable bowel syndrome)    sees Dr. Silvano Rusk   . Routine gynecological examination    sees Dr. Freda Munro    Past Surgical History:  Procedure Laterality Date  . CESAREAN SECTION    . ROOT CANAL      There were no vitals filed for this visit.  Subjective Assessment - 09/06/17 1154    Subjective  I just have pain in the lower back, and right shoulder blade. Pain has decreased by 35%. Patient does not walk daily due to the weather and dark at night.  Walks 1-2 times per week.     Limitations  Sitting;Standing;Walking    How long can you sit comfortably?  uncomfortable at work    How long can you stand comfortably?  10-15 minutes    How long can you walk comfortably?  haven't tried    Diagnostic tests  x-ray at ED: negative    Patient Stated Goals  reduce pain, sit without pain, return to exercise    Currently in Pain?  Yes    Pain Score  7     Pain Location  Back    Pain Orientation  Upper;Lower    Pain Descriptors / Indicators  Sore    Pain Onset  1 to 4 weeks ago    Pain Frequency  Intermittent    Aggravating Factors   activity    Pain Relieving Factors   ice/muscle relaxer    Multiple Pain Sites  No                      [facility] Adult PT Treatment/Exercise - 09/06/17 0001      Self-Care   Self-Care  Other Self-Care Comments    Other Self-Care Comments   walking program in safe areas or in her home      Lumbar Exercises: Stretches   Single Knee to Chest Stretch  5 reps;10 seconds each LE    Lower Trunk Rotation  2 reps;30 seconds    Quadruped Mid Back Stretch  3 reps;30 seconds also to each side      Lumbar Exercises: Supine   Clam  10 reps;1 second abdominal bracing; one leg at a time    Bridge  10 reps      Lumbar Exercises: Quadruped   Madcat/Old Horse  10 reps    Other Quadruped Lumbar Exercises  quadruped with placing one arm under and through the other to increase trunk rotation  and stretch the rhomboids      Shoulder Exercises: Supine  Other Supine Exercises  alternate shoulder flexion 15 times each slowly      Shoulder Exercises: Standing   Other Standing Exercises  shoulder flexion with UE ranger bil. shoulders    Other Standing Exercises  trunk sidebending 5 times each way      Shoulder Exercises: Stretch   Other Shoulder Stretches  cross one arm over the other ahd bring arm upward 5 times each way    Other Shoulder Stretches  sitting trunk flexion 5 times  then go side to side       Modalities   Modalities  Electrical Stimulation;Moist Heat      Moist Heat Therapy   Number Minutes Moist Heat  15 Minutes    Moist Heat Location  Shoulder;Lumbar Spine      Electrical Stimulation   Electrical Stimulation Location  right sided thoracic    Electrical Stimulation Action  IFC    Electrical Stimulation Parameters  15 min    Electrical Stimulation Goals  Pain               PT Nolasco Term Goals - 08/27/17 0816      PT Islam TERM GOAL #3   Title  demonstrate symmetry with ambulation due to reduced Rt hip pain    Status  Achieved      PT Fraizer TERM GOAL #4   Title  report > or = to 30%  reduction in thoracic/lumbar pain with work and home tasks    Status  Achieved        PT Long Term Goals - 09/06/17 1409      PT LONG TERM GOAL #1   Time  8    Period  Weeks    Status  On-going      PT LONG TERM GOAL #2   Title  reduce FOTO to < or = to 34% limitation    Period  Weeks    Status  On-going      PT LONG TERM GOAL #3   Title  return to walking for exercise without limitation or increased pain    Time  8    Period  Weeks    Status  On-going      PT LONG TERM GOAL #4   Title  reach overhead with Rt UE without incresaed pain    Time  8    Period  Weeks    Status  On-going            Plan - 09/06/17 1358    Clinical Impression Statement  Patient reports she is 30% better and feels more sore.  Patient was able to do her exercises with good control of abdominal contraction and no show of pain.  Patient did well with her stretches and did not report increase in pain after therapy.  Patient will benefit from skilled therapy to improve mobility with less pain.      Rehab Potential  Good    PT Frequency  3x / week    PT Duration  8 weeks    PT Treatment/Interventions  ADLs/Self Care Home Management;Cryotherapy;Electrical Stimulation;Ultrasound;Moist Heat;Therapeutic activities;Therapeutic exercise;Functional mobility training;Neuromuscular re-education;Patient/family education;Passive range of motion;Manual techniques;Dry needling;Taping    PT Next Visit Plan  gentle flexibility for Rt shoulder, lumbar and thoracic spine, functional reactivation;   modalities for pain control    PT Home Exercise Plan  progress as needed    Consulted and Agree with Plan of Care  Patient  Patient will benefit from skilled therapeutic intervention in order to improve the following deficits and impairments:  Impaired flexibility, Difficulty walking, Decreased range of motion, Decreased strength, Impaired UE functional use, Increased muscle spasms, Pain, Decreased activity  tolerance  Visit Diagnosis: Pain in thoracic spine  Acute bilateral low back pain without sciatica  Acute pain of right shoulder  Pain in right hip     Problem List Patient Active Problem List   Diagnosis Date Noted  . Hypokalemia 02/13/2017  . Chronic right hip pain 11/26/2015  . Hyperlipidemia 12/23/2010  . ALLERGIC RHINITIS CAUSE UNSPECIFIED 07/24/2008  . COUGH 02/26/2008  . ACUTE BRONCHITIS 01/20/2008  . VIRAL URI 12/03/2007  . ACUTE SINUSITIS, UNSPECIFIED 10/14/2007  . GERD 10/14/2007  . IBS 10/14/2007    Earlie Counts, PT 09/06/17 2:11 PM    [facility] [address], [place], [place], [address] Phone: [phone]   Fax:  [phone]  Name: Brittany Rodgers MRN: [id number] Date of Birth: 1971-07-07

## 2017-09-10 ENCOUNTER — Encounter: Payer: Self-pay | Admitting: Physical Therapy

## 2017-09-10 ENCOUNTER — Ambulatory Visit: Payer: BLUE CROSS/BLUE SHIELD | Admitting: Physical Therapy

## 2017-09-10 DIAGNOSIS — M545 Low back pain, unspecified: Secondary | ICD-10-CM

## 2017-09-10 DIAGNOSIS — M546 Pain in thoracic spine: Secondary | ICD-10-CM

## 2017-09-10 DIAGNOSIS — M25551 Pain in right hip: Secondary | ICD-10-CM

## 2017-09-10 DIAGNOSIS — M25511 Pain in right shoulder: Secondary | ICD-10-CM

## 2017-09-10 NOTE — Therapy (Signed)
[facility] [address], [place], [place], [address] Phone: [phone]   Fax:  [phone]  Physical Therapy Treatment  Patient Details  Name: Brittany Rodgers MRN: [id number] Date of Birth: Jun 03, 1971 Referring Provider: Alysia Penna, MD   Encounter Date: 09/10/2017  PT End of Session - 09/10/17 0813    Visit Number  11    Date for PT Re-Evaluation  10/03/17    Authorization Type  BCBS    PT Start Time  0803    PT Stop Time  0900    PT Time Calculation (min)  57 min    Activity Tolerance  Patient tolerated treatment well;No increased pain    Behavior During Therapy  WFL for tasks assessed/performed       Past Medical History:  Diagnosis Date  . Chronic cough   . GERD (gastroesophageal reflux disease)   . Hyperlipidemia   . IBS (irritable bowel syndrome)    sees Dr. Silvano Rusk   . Routine gynecological examination    sees Dr. Freda Munro    Past Surgical History:  Procedure Laterality Date  . CESAREAN SECTION    . ROOT CANAL      There were no vitals filed for this visit.  Subjective Assessment - 09/10/17 0810    Subjective  I was sore after last visit.      Limitations  Sitting;Standing;Walking    How long can you sit comfortably?  uncomfortable at work    How long can you stand comfortably?  10-15 minutes    How long can you walk comfortably?  haven't tried    Diagnostic tests  x-ray at ED: negative    Patient Stated Goals  reduce pain, sit without pain, return to exercise    Currently in Pain?  Yes    Pain Score  7     Pain Location  Back    Pain Orientation  Upper;Lower    Pain Descriptors / Indicators  Sore    Pain Type  Acute pain    Pain Onset  1 to 4 weeks ago    Pain Frequency  Intermittent    Aggravating Factors   activity    Pain Relieving Factors  ice/muscle relaxer    Effect of Pain on Daily Activities  unable to complete full activity    Multiple Pain Sites  No                       [facility] Adult PT Treatment/Exercise - 09/10/17 0001      Lumbar Exercises: Stretches   Lower Trunk Rotation  2 reps;30 seconds therapist gives overpressure    Pelvic Tilt  -- 20 times with pelvis on sit fit; pelvis circles    Prone on Elbows Stretch  5 reps 1 sec; VC to keep shoulders down    Quadruped Mid Back Stretch  3 reps;30 seconds also to each side; used roller on lumbar      Lumbar Exercises: Aerobic   Stationary Bike  Nustep L1 5 min seat 7      Lumbar Exercises: Supine   Dead Bug  20 reps;1 second therapist gives shoulder an overstretch      Lumbar Exercises: Quadruped   Madcat/Old Horse  10 reps therapist is guiding the lumbar vertebrae      Modalities   Modalities  Electrical Stimulation;Cryotherapy      Cryotherapy   Number Minutes Cryotherapy  15 Minutes    Cryotherapy  Location  Lumbar Spine right shoulder    Type of Cryotherapy  Ice pack      Electrical Stimulation   Electrical Stimulation Location  right sided thoracic    Electrical Stimulation Action  IFC    Electrical Stimulation Parameters  15 min, to patient tolerance    Electrical Stimulation Goals  Pain      Manual Therapy   Manual Therapy  Joint mobilization    Manual therapy comments  Active stretch of interscapular area    Joint Mobilization  open up right mid rib cage laterally; rotational mobilization of T3-T6 on right grade 3    Soft tissue mobilization  right axilla with leaning trunk to the left               PT Tenbrink Term Goals - 08/27/17 0816      PT Aguino TERM GOAL #3   Title  demonstrate symmetry with ambulation due to reduced Rt hip pain    Status  Achieved      PT Vallie TERM GOAL #4   Title  report > or = to 30% reduction in thoracic/lumbar pain with work and home tasks    Status  Achieved        PT Long Term Goals - 09/10/17 0845      PT LONG TERM GOAL #1   Title  be independent in advanced HEP    Baseline  still learning    Time  8     Period  Weeks    Status  On-going      PT LONG TERM GOAL #3   Title  return to walking for exercise without limitation or increased pain    Baseline  weather has not permitted    Time  8    Period  Weeks    Status  On-going      PT LONG TERM GOAL #4   Title  reach overhead with Rt UE without incresaed pain    Baseline  has tigthness in rib cage    Time  8    Period  Weeks    Status  On-going            Plan - 09/10/17 0350    Clinical Impression Statement  Patient is sore with movement due to her fascial restrictions.  Patient does well with stretch and strengthen together.  Patient had increased mobility of the midthoracic after mobilization and stretching of the area.  Patient felt loser.  Patient has tightness in right mid rib cage.  Patient will benefit from skilled therapy to improve mobility and less pain.     Rehab Potential  Good    PT Frequency  3x / week    PT Duration  8 weeks    PT Treatment/Interventions  ADLs/Self Care Home Management;Cryotherapy;Electrical Stimulation;Ultrasound;Moist Heat;Therapeutic activities;Therapeutic exercise;Functional mobility training;Neuromuscular re-education;Patient/family education;Passive range of motion;Manual techniques;Dry needling;Taping    PT Next Visit Plan  gentle flexibility for Rt shoulder, lumbar and thoracic spine, functional reactivation;   modalities for pain control; patient perfers ice    PT Home Exercise Plan  progress as needed    Consulted and Agree with Plan of Care  Patient       Patient will benefit from skilled therapeutic intervention in order to improve the following deficits and impairments:     Visit Diagnosis: Pain in thoracic spine  Acute bilateral low back pain without sciatica  Acute pain of right shoulder  Pain in right hip  Problem List Patient Active Problem List   Diagnosis Date Noted  . Hypokalemia 02/13/2017  . Chronic right hip pain 11/26/2015  . Hyperlipidemia 12/23/2010   . ALLERGIC RHINITIS CAUSE UNSPECIFIED 07/24/2008  . COUGH 02/26/2008  . ACUTE BRONCHITIS 01/20/2008  . VIRAL URI 12/03/2007  . ACUTE SINUSITIS, UNSPECIFIED 10/14/2007  . GERD 10/14/2007  . IBS 10/14/2007    Earlie Counts, PT 09/10/17 8:46 AM   [facility] [address] [place], [place], [address] Phone: [phone]   Fax:  [phone]  Name: Brittany Rodgers MRN: [id number] Date of Birth: 21-Oct-1970

## 2017-09-12 ENCOUNTER — Ambulatory Visit: Payer: BLUE CROSS/BLUE SHIELD | Admitting: Physical Therapy

## 2017-09-12 ENCOUNTER — Encounter: Payer: Self-pay | Admitting: Physical Therapy

## 2017-09-12 DIAGNOSIS — M545 Low back pain, unspecified: Secondary | ICD-10-CM

## 2017-09-12 DIAGNOSIS — M546 Pain in thoracic spine: Secondary | ICD-10-CM | POA: Diagnosis not present

## 2017-09-12 DIAGNOSIS — M25551 Pain in right hip: Secondary | ICD-10-CM

## 2017-09-12 DIAGNOSIS — M25511 Pain in right shoulder: Secondary | ICD-10-CM

## 2017-09-12 NOTE — Therapy (Signed)
[facility] [address] [place], [place], [address] Phone: [phone]   Fax:  [phone]  Physical Therapy Treatment  Patient Details  Name: Brittany Rodgers MRN: [id number] Date of Birth: [birth year] Referring Provider: Alysia Penna, MD   Encounter Date: 09/12/2017  PT End of Session - 09/12/17 0819    Visit Number  12    Date for PT Re-Evaluation  10/03/17    Authorization Type  BCBS    PT Start Time  0803    PT Stop Time  0900    PT Time Calculation (min)  57 min    Activity Tolerance  Patient tolerated treatment well;No increased pain    Behavior During Therapy  WFL for tasks assessed/performed       Past Medical History:  Diagnosis Date  . Chronic cough   . GERD (gastroesophageal reflux disease)   . Hyperlipidemia   . IBS (irritable bowel syndrome)    sees Dr. Silvano Rusk   . Routine gynecological examination    sees Dr. Freda Munro    Past Surgical History:  Procedure Laterality Date  . CESAREAN SECTION    . ROOT CANAL      There were no vitals filed for this visit.  Subjective Assessment - 09/12/17 0812    Subjective  Last visit was a little painful. I felt like I had more mobility after therapy. I am doing my chores and everything I shoulder be doing.  I still have to take rests during activities.  I have not been doing as much walking.     Pertinent History  none    Limitations  Sitting;Standing;Walking    How long can you sit comfortably?  uncomfortable at work    How long can you stand comfortably?  10-15 minutes    How long can you walk comfortably?  haven't tried    Diagnostic tests  x-ray at ED: negative    Patient Stated Goals  reduce pain, sit without pain, return to exercise    Currently in Pain?  Yes    Pain Score  8     Pain Location  Back    Pain Orientation  Upper;Lower    Pain Descriptors / Indicators  Sore    Pain Type  Acute pain    Pain Onset  1 to 4 weeks ago    Pain Frequency  Intermittent    Aggravating Factors   activity    Pain Relieving Factors  ice/muscle relaxer    Multiple Pain Sites  No                      [facility] Adult PT Treatment/Exercise - 09/12/17 0001      Lumbar Exercises: Stretches   Passive Hamstring Stretch  1 rep;30 seconds bil., supine    Pelvic Tilt  -- 20 times with pelvis on sit fit; pelvis circles    Prone on Elbows Stretch  5 reps 1 sec; VC to keep shoulders down    Quadruped Mid Back Stretch  3 reps;30 seconds also to each side; used roller on lumbar    Piriformis Stretch  1 rep;30 seconds bil. supine      Lumbar Exercises: Aerobic   Stationary Bike  Nustep L1 5 min seat 7 arms #10      Lumbar Exercises: Supine   Dead Bug  20 reps;1 second therapist gives shoulder an overstretch      Lumbar Exercises: Quadruped   Madcat/Old  Horse  10 reps therapist is guiding the lumbar vertebrae    Single Arm Raise  Right;Left;15 reps    Straight Leg Raise  15 reps      Modalities   Modalities  Electrical Stimulation;Cryotherapy      Cryotherapy   Number Minutes Cryotherapy  15 Minutes    Cryotherapy Location  Lumbar Spine right shoulder    Type of Cryotherapy  Ice pack      Electrical Stimulation   Electrical Stimulation Location  right sided thoracic    Electrical Stimulation Action  IFC    Electrical Stimulation Parameters  15 min; to patient tolerance    Electrical Stimulation Goals  Pain               PT Milby Term Goals - 09/12/17 0841      PT [place]elo TERM GOAL #2   Title  walk with dog for 15 minutes without increased LBP/thoracic pain    Time  4    Period  Weeks    Status  Achieved        PT Long Term Goals - 09/10/17 0845      PT LONG TERM GOAL #1   Title  be independent in advanced HEP    Baseline  still learning    Time  8    Period  Weeks    Status  On-going      PT LONG TERM GOAL #3   Title  return to walking for exercise without limitation or increased pain     Baseline  weather has not permitted    Time  8    Period  Weeks    Status  On-going      PT LONG TERM GOAL #4   Title  reach overhead with Rt UE without incresaed pain    Baseline  has tigthness in rib cage    Time  8    Period  Weeks    Status  On-going            Plan - 09/12/17 0841    Clinical Impression Statement  Patient is having more mobility but pain is staying the same or may increase at times.  Patient has many myofascial restrictions so working on movement.  Patient will work hard in therapy.  Patient prefers ice still.  Patient pain weekly stays around a 7-8/10.  Patient will benefit from skilled therapy to improve mobility and less pain.     Rehab Potential  Good    PT Frequency  3x / week    PT Duration  8 weeks    PT Treatment/Interventions  ADLs/Self Care Home Management;Cryotherapy;Electrical Stimulation;Ultrasound;Moist Heat;Therapeutic activities;Therapeutic exercise;Functional mobility training;Neuromuscular re-education;Patient/family education;Passive range of motion;Manual techniques;Dry needling;Taping    PT Next Visit Plan  gentle flexibility for Rt shoulder, lumbar and thoracic spine, functional reactivation;   modalities for pain control; patient perfers ice; Take shoulder ROM     PT Home Exercise Plan  progress as needed    Consulted and Agree with Plan of Care  Patient       Patient will benefit from skilled therapeutic intervention in order to improve the following deficits and impairments:  Impaired flexibility, Difficulty walking, Decreased range of motion, Decreased strength, Impaired UE functional use, Increased muscle spasms, Pain, Decreased activity tolerance  Visit Diagnosis: Pain in thoracic spine  Acute bilateral low back pain without sciatica  Acute pain of right shoulder  Pain in right hip     Problem List  Patient Active Problem List   Diagnosis Date Noted  . Hypokalemia 02/13/2017  . Chronic right hip pain 11/26/2015  .  Hyperlipidemia 12/23/2010  . ALLERGIC RHINITIS CAUSE UNSPECIFIED 07/24/2008  . COUGH 02/26/2008  . ACUTE BRONCHITIS 01/20/2008  . VIRAL URI 12/03/2007  . ACUTE SINUSITIS, UNSPECIFIED 10/14/2007  . GERD 10/14/2007  . IBS 10/14/2007    Earlie Counts, PT 09/12/17 8:44 AM    [facility] [address], [place], [place], [address] Phone: [phone]   Fax:  [phone]  Name: Brittany Rodgers MRN: [id number] Date of Birth: 09-10-1971

## 2017-09-14 ENCOUNTER — Encounter: Payer: Self-pay | Admitting: Physical Therapy

## 2017-09-14 ENCOUNTER — Ambulatory Visit: Payer: BLUE CROSS/BLUE SHIELD | Admitting: Physical Therapy

## 2017-09-14 DIAGNOSIS — M546 Pain in thoracic spine: Secondary | ICD-10-CM | POA: Diagnosis not present

## 2017-09-14 DIAGNOSIS — M545 Low back pain, unspecified: Secondary | ICD-10-CM

## 2017-09-14 DIAGNOSIS — M25511 Pain in right shoulder: Secondary | ICD-10-CM

## 2017-09-14 DIAGNOSIS — M25551 Pain in right hip: Secondary | ICD-10-CM

## 2017-09-14 NOTE — Therapy (Signed)
[facility] [address] [place], [place], [address] Phone: [phone]   Fax:  [phone]  Physical Therapy Treatment  Patient Details  Name: Brittany Rodgers MRN: [id number] Date of Birth: 02/14/1971 Referring Provider: Alysia Penna, MD   Encounter Date: 09/14/2017  PT End of Session - 09/14/17 0819    Visit Number  13    Date for PT Re-Evaluation  10/03/17    Authorization Type  BCBS    PT Start Time  0807    PT Stop Time  0900    PT Time Calculation (min)  53 min    Activity Tolerance  Patient tolerated treatment well;No increased pain    Behavior During Therapy  WFL for tasks assessed/performed       Past Medical History:  Diagnosis Date  . Chronic cough   . GERD (gastroesophageal reflux disease)   . Hyperlipidemia   . IBS (irritable bowel syndrome)    sees Dr. Silvano Rusk   . Routine gynecological examination    sees Dr. Freda Munro    Past Surgical History:  Procedure Laterality Date  . CESAREAN SECTION    . ROOT CANAL      There were no vitals filed for this visit.  Subjective Assessment - 09/14/17 0810    Subjective  Pt reports that things are about the same since her last session. She reports that she continues to complete her stretches at home.     Pertinent History  none    Limitations  Sitting;Standing;Walking    How long can you sit comfortably?  uncomfortable at work    How long can you stand comfortably?  10-15 minutes    How long can you walk comfortably?  haven't tried    Diagnostic tests  x-ray at ED: negative    Patient Stated Goals  reduce pain, sit without pain, return to exercise    Currently in Pain?  Other (Comment) same as last session    Pain Onset  1 to 4 weeks ago                      [facility] Adult PT Treatment/Exercise - 09/14/17 0001      Exercises   Exercises  Other Exercises    Other Exercises   Child's pose stretch hold x30 sec, 5 reps (UE  elevated on bolster), closckwise and counterclockwise x20 reps each      Lumbar Exercises: Aerobic   Elliptical  L1 x4 min forward, incline 5       Lumbar Exercises: Seated   Other Seated Lumbar Exercises  seated on dyna disc anterior/posterior pelvic tilts x20 reps       Lumbar Exercises: Quadruped   Madcat/Old Horse  15 reps focus on proper breathing coordination with movement     Other Quadruped Lumbar Exercises  quadruped UE reach under and across for rotation stretch x10 reps each direction      Shoulder Exercises: Standing   Extension  Both;Strengthening;15 reps;Theraband    Theraband Level (Shoulder Extension)  Level 1 (Yellow)    Row  Both;Strengthening;Theraband;15 reps    Theraband Level (Shoulder Row)  Level 1 (Yellow)    Other Standing Exercises  shoulder ER towel squeeze x10 reps with yellow TB       Moist Heat Therapy   Number Minutes Moist Heat  15 Minutes    Moist Heat Location  Shoulder;Lumbar Spine      Acupuncturist  Stimulation Location  right sided thoracic    Electrical Stimulation Action  IFC    Electrical Stimulation Parameters  65min, to pt tolerance     Electrical Stimulation Goals  Pain             PT Education - 09/14/17 0906    Education provided  Yes    Education Details  shift to moist heat rather than ice to help relax muscles    Person(s) Educated  Patient    Methods  Explanation    Comprehension  Verbalized understanding       PT [place] Term Goals - 09/12/17 0841      PT Sexson TERM GOAL #2   Title  walk with dog for 15 minutes without increased LBP/thoracic pain    Time  4    Period  Weeks    Status  Achieved        PT Long Term Goals - 09/10/17 0845      PT LONG TERM GOAL #1   Title  be independent in advanced HEP    Baseline  still learning    Time  8    Period  Weeks    Status  On-going      PT LONG TERM GOAL #3   Title  return to walking for exercise without limitation or increased pain     Baseline  weather has not permitted    Time  8    Period  Weeks    Status  On-going      PT LONG TERM GOAL #4   Title  reach overhead with Rt UE without incresaed pain    Baseline  has tigthness in rib cage    Time  8    Period  Weeks    Status  On-going            Plan - 09/14/17 3419    Clinical Impression Statement  Pt arrived today with minimal change in pain in the days following her last session despite her report of consistent HEP adherence. Today's session focused on more strengthening and mobility therex in order to improve pt's activity tolerance, noting some increased soreness with thoracic rotation activity. Therapist encouraged pt to focus on her stretches and mobility along with heat at home to help decrease pain. Ended session without any report of increase in pain.    Rehab Potential  Good    PT Frequency  3x / week    PT Duration  8 weeks    PT Treatment/Interventions  ADLs/Self Care Home Management;Cryotherapy;Electrical Stimulation;Ultrasound;Moist Heat;Therapeutic activities;Therapeutic exercise;Functional mobility training;Neuromuscular re-education;Patient/family education;Passive range of motion;Manual techniques;Dry needling;Taping    PT Next Visit Plan  gentle flexibility for Rt shoulder, lumbar and thoracic spine, functional reactivation;  gentle posture strengthening; modalities for pain control; patient perfers ice; Take shoulder ROM     PT Home Exercise Plan  progress as needed    Consulted and Agree with Plan of Care  Patient       Patient will benefit from skilled therapeutic intervention in order to improve the following deficits and impairments:  Impaired flexibility, Difficulty walking, Decreased range of motion, Decreased strength, Impaired UE functional use, Increased muscle spasms, Pain, Decreased activity tolerance  Visit Diagnosis: Pain in thoracic spine  Acute bilateral low back pain without sciatica  Acute pain of right shoulder  Pain  in right hip     Problem List Patient Active Problem List   Diagnosis Date Noted  . Hypokalemia 02/13/2017  .  Chronic right hip pain 11/26/2015  . Hyperlipidemia 12/23/2010  . ALLERGIC RHINITIS CAUSE UNSPECIFIED 07/24/2008  . COUGH 02/26/2008  . ACUTE BRONCHITIS 01/20/2008  . VIRAL URI 12/03/2007  . ACUTE SINUSITIS, UNSPECIFIED 10/14/2007  . GERD 10/14/2007  . IBS 10/14/2007    9:08 AM,09/14/17 Elly Modena PT, DPT [facility] [address], [place], [place], [address] Phone: [phone]   Fax:  [phone]  Name: Brittany Rodgers MRN: [id number] Date of Birth: Aug 16, 1971

## 2017-09-21 ENCOUNTER — Ambulatory Visit: Payer: BLUE CROSS/BLUE SHIELD | Admitting: Physical Therapy

## 2017-09-21 DIAGNOSIS — M546 Pain in thoracic spine: Secondary | ICD-10-CM | POA: Diagnosis not present

## 2017-09-21 DIAGNOSIS — M545 Low back pain, unspecified: Secondary | ICD-10-CM

## 2017-09-21 DIAGNOSIS — M25551 Pain in right hip: Secondary | ICD-10-CM

## 2017-09-21 DIAGNOSIS — M25511 Pain in right shoulder: Secondary | ICD-10-CM

## 2017-09-21 NOTE — Therapy (Signed)
[facility] [address] [place], [place], [address] Phone: [phone]   Fax:  [phone]  Physical Therapy Treatment  Patient Details  Name: Brittany Rodgers MRN: [id number] Date of Birth: 16-Mar-1971 Referring Provider: Alysia Penna, MD   Encounter Date: 09/21/2017  PT End of Session - 09/21/17 0823    Visit Number  14    Date for PT Re-Evaluation  10/03/17    Authorization Type  BCBS    PT Start Time  0815 Pt arrived late to appointment     PT Stop Time  0908 e-stim end of session    PT Time Calculation (min)  53 min    Activity Tolerance  Patient tolerated treatment well;No increased pain    Behavior During Therapy  WFL for tasks assessed/performed       Past Medical History:  Diagnosis Date  . Chronic cough   . GERD (gastroesophageal reflux disease)   . Hyperlipidemia   . IBS (irritable bowel syndrome)    sees Dr. Silvano Rusk   . Routine gynecological examination    sees Dr. Freda Munro    Past Surgical History:  Procedure Laterality Date  . CESAREAN SECTION    . ROOT CANAL      There were no vitals filed for this visit.  Subjective Assessment - 09/21/17 0817    Subjective  Pt reports that her pain is not as intense today, just more soreness than anything. She continues to work on her HEP and feels that she can move much better.    Pertinent History  none    Limitations  Sitting;Standing;Walking    How long can you sit comfortably?  uncomfortable at work    How long can you stand comfortably?  10-15 minutes    How long can you walk comfortably?  haven't tried    Diagnostic tests  x-ray at ED: negative    Patient Stated Goals  reduce pain, sit without pain, return to exercise    Currently in Pain?  Yes    Pain Score  7     Pain Location  Back    Pain Orientation  Upper;Lower    Pain Descriptors / Indicators  Sore    Pain Type  Acute pain    Pain Radiating Towards  none     Pain Onset  1  to 4 weeks ago    Pain Frequency  Intermittent    Aggravating Factors   bad weather doesn't help much; alot of bending and lifting     Pain Relieving Factors  muscle relaxer, relaxing     Effect of Pain on Daily Activities  unable to complete alot of daily activity                       [facility] Adult PT Treatment/Exercise - 09/21/17 0001      Lumbar Exercises: Supine   Other Supine Lumbar Exercises  scapular retraction over foam roll x20 reps; rolling thoracic and lumbar spine with soft foam roll       Shoulder Exercises: Supine   External Rotation  Both;10 reps;Limitations    External Rotation Limitations  x2 sets with yellow TB     Other Supine Exercises  D2 flexion with yellow TB x10 reps each UE;      Shoulder Exercises: Seated   Other Seated Exercises  higher function thinking activity eyes open 3x30 sec, eyes closed x30 sec  Acupuncturist Location  right sided thoracic/teres region     Chartered certified accountant  IFC    Electrical Stimulation Parameters  15 min, to pt tolerance     Electrical Stimulation Goals  Pain             PT Education - 09/21/17 [phone]    Education provided  Yes    Education Details  impact psychological factors can have on pain and pain response    Person(s) Educated  Patient    Methods  Explanation    Comprehension  Verbalized understanding       PT [place] Term Goals - 09/21/17 0903      PT Dexheimer TERM GOAL #2   Title  walk with dog for 15 minutes without increased LBP/thoracic pain    Time  4    Period  Weeks    Status  Achieved      PT [place] TERM GOAL #5   Title  demonstrate Rt=Lt shoulder A/ROM flexion to allow for reaching overhead with Rt UE    Baseline  Pt able to reach overhead during today's session    Time  4    Period  Weeks    Status  Achieved        PT Long Term Goals - 09/21/17 7353      PT LONG TERM GOAL #1   Title  be independent in advanced HEP    Time  8     Period  Weeks    Status  On-going      PT LONG TERM GOAL #2   Title  reduce FOTO to < or = to 34% limitation    Time  8    Period  Weeks    Status  On-going      PT LONG TERM GOAL #3   Title  return to walking for exercise without limitation or increased pain    Time  8    Period  Weeks    Status  On-going      PT LONG TERM GOAL #4   Title  reach overhead with Rt UE without incresaed pain    Time  8    Period  Weeks    Status  On-going      PT LONG TERM GOAL #5   Title  stand for > or = to 45 minutes without limitation     Time  8    Period  Weeks    Status  On-going      PT LONG TERM GOAL #6   Title  report a 75% reduction in thoracic/lumbar pain with ADLs and self-care    Time  8    Period  Weeks    Status  On-going            Plan - 09/21/17 2992    Clinical Impression Statement  Pt arrived late to her appointment today, although she notes some improvements in her overall movement at home and with other activity. Her pain rating upon arrival was 7/10 and session continued with focus on gentle strengthening and myofascial rolling techniques to decrease tissue tension. Therapist also introduced higher function thinking activity in order to decrease pt's attention to other sensations. End of session pt reported no increase in pain despite progression of exercises. Will continue with current POC.     Rehab Potential  Good    PT Frequency  3x / week    PT Duration  8 weeks    PT Treatment/Interventions  ADLs/Self Care Home Management;Cryotherapy;Electrical Stimulation;Ultrasound;Moist Heat;Therapeutic activities;Therapeutic exercise;Functional mobility training;Neuromuscular re-education;Patient/family education;Passive range of motion;Manual techniques;Dry needling;Taping    PT Next Visit Plan  Manual techniques to address Rt shoulder/latissimus restrictions; lumbar and thoracic spine foam rolling, continue with higher function reaction; gentle posture strengthening;  heat and stem as needed.     PT Home Exercise Plan  progress as needed    Consulted and Agree with Plan of Care  Patient       Patient will benefit from skilled therapeutic intervention in order to improve the following deficits and impairments:  Impaired flexibility, Difficulty walking, Decreased range of motion, Decreased strength, Impaired UE functional use, Increased muscle spasms, Pain, Decreased activity tolerance  Visit Diagnosis: Pain in thoracic spine  Acute bilateral low back pain without sciatica  Acute pain of right shoulder  Pain in right hip     Problem List Patient Active Problem List   Diagnosis Date Noted  . Hypokalemia 02/13/2017  . Chronic right hip pain 11/26/2015  . Hyperlipidemia 12/23/2010  . ALLERGIC RHINITIS CAUSE UNSPECIFIED 07/24/2008  . COUGH 02/26/2008  . ACUTE BRONCHITIS 01/20/2008  . VIRAL URI 12/03/2007  . ACUTE SINUSITIS, UNSPECIFIED 10/14/2007  . GERD 10/14/2007  . IBS 10/14/2007    9:13 AM,09/21/17 Elly Modena PT, DPT [facility] [address] [place], [place], [address] Phone: [phone]   Fax:  [phone]  Name: Brittany Rodgers MRN: [id number] Date of Birth: 1971/05/06

## 2017-09-24 ENCOUNTER — Ambulatory Visit: Payer: BLUE CROSS/BLUE SHIELD | Admitting: Physical Therapy

## 2017-09-24 ENCOUNTER — Encounter: Payer: Self-pay | Admitting: Physical Therapy

## 2017-09-24 DIAGNOSIS — M545 Low back pain, unspecified: Secondary | ICD-10-CM

## 2017-09-24 DIAGNOSIS — M546 Pain in thoracic spine: Secondary | ICD-10-CM | POA: Diagnosis not present

## 2017-09-24 DIAGNOSIS — M25551 Pain in right hip: Secondary | ICD-10-CM

## 2017-09-24 DIAGNOSIS — M25511 Pain in right shoulder: Secondary | ICD-10-CM

## 2017-09-24 NOTE — Therapy (Signed)
[facility] [address], [place], [place], [address] Phone: [phone]   Fax:  [phone]  Physical Therapy Treatment  Patient Details  Name: Brittany Rodgers MRN: [id number] Date of Birth: 22-Jan-1971 Referring Provider: Alysia Penna, MD   Encounter Date: 09/24/2017  PT End of Session - 09/24/17 0925    Visit Number  15    Date for PT Re-Evaluation  10/03/17    Authorization Type  BCBS    PT Start Time  0845    PT Stop Time  0945    PT Time Calculation (min)  60 min    Activity Tolerance  Patient tolerated treatment well;No increased pain    Behavior During Therapy  WFL for tasks assessed/performed       Past Medical History:  Diagnosis Date  . Chronic cough   . GERD (gastroesophageal reflux disease)   . Hyperlipidemia   . IBS (irritable bowel syndrome)    sees Dr. Silvano Rusk   . Routine gynecological examination    sees Dr. Freda Munro    Past Surgical History:  Procedure Laterality Date  . CESAREAN SECTION    . ROOT CANAL      There were no vitals filed for this visit.  Subjective Assessment - 09/24/17 0853    Subjective  I am able to move again. I am doing my chores again.  Heat is feeling better.     Pertinent History  none    Limitations  Sitting;Standing;Walking    How long can you sit comfortably?  uncomfortable at work    How long can you stand comfortably?  10-15 minutes    How long can you walk comfortably?  haven't tried    Diagnostic tests  x-ray at ED: negative    Patient Stated Goals  reduce pain, sit without pain, return to exercise    Currently in Pain?  Yes    Pain Score  6     Pain Location  Back    Pain Orientation  Lower;Upper    Pain Descriptors / Indicators  Sore    Pain Type  Acute pain    Pain Onset  1 to 4 weeks ago    Pain Frequency  Intermittent    Aggravating Factors   bad weather; alot of bending and lifting    Pain Relieving Factors  muscle relaxer, heat                      [facility] Adult PT Treatment/Exercise - 09/24/17 0001      Lumbar Exercises: Aerobic   UBE (Upper Arm Bike)  2 min forward/2 min backward level 0 sitting on a green physioball      Lumbar Exercises: Seated   Other Seated Lumbar Exercises  sit on green ball- pelvic sway, pelvic circles, pelvic diagonals, pelvic tilt      Shoulder Exercises: Seated   Extension  Strengthening;Both;10 reps;Weights sit on green ball    Extension Weight (lbs)  5 power tower    Retraction  Strengthening;Both;10 reps;Weights sitting on green ball    Retraction Weight (lbs)  15 power tower    Flexion  Right;Left;15 reps;Weights sitting on green ball    Flexion Weight (lbs)  1 to 90 degrees    Abduction  Right;Strengthening;Left;15 reps;Weights sitting on green ball    ABduction Weight (lbs)  1 to 90 degrees    Other Seated Exercises  sit on green ball- bil. shoulder horizontal abduction with  PNF 5# two hand,       Modalities   Modalities  Electrical Stimulation;Moist Heat      Moist Heat Therapy   Number Minutes Moist Heat  15 Minutes    Moist Heat Location  Shoulder back      Electrical Stimulation   Electrical Stimulation Location  right sided thoracic/teres region     Electrical Stimulation Action  IFC    Electrical Stimulation Parameters  15 min, to patient tolerance    Electrical Stimulation Goals  Pain               PT Silas Term Goals - 09/21/17 0903      PT Skillen TERM GOAL #2   Title  walk with dog for 15 minutes without increased LBP/thoracic pain    Time  4    Period  Weeks    Status  Achieved      PT Werling TERM GOAL #5   Title  demonstrate Rt=Lt shoulder A/ROM flexion to allow for reaching overhead with Rt UE    Baseline  Pt able to reach overhead during today's session    Time  4    Period  Weeks    Status  Achieved        PT Long Term Goals - 09/24/17 8338      PT LONG TERM GOAL #1   Title  be independent in advanced HEP    Baseline   still learning    Time  8    Status  On-going      PT LONG TERM GOAL #2   Title  reduce FOTO to < or = to 34% limitation    Time  8    Period  Weeks    Status  On-going      PT LONG TERM GOAL #3   Title  return to walking for exercise without limitation or increased pain    Baseline  weather has not permitted    Time  8    Period  Weeks    Status  On-going      PT LONG TERM GOAL #4   Title  reach overhead with Rt UE without incresaed pain    Baseline  has tigthness in rib cage    Time  8    Period  Weeks    Status  On-going      PT LONG TERM GOAL #5   Title  stand for > or = to 45 minutes without limitation     Time  8    Period  Weeks    Status  On-going            Plan - 09/24/17 0850    Clinical Impression Statement  Patient is now able to move and do her chores.  Patient is now using weights with her exercise program.  Patient is able to walk with increased flow instead of stiffness.  Patient will benefit from skilled therapy to improve mobility and strength while decreasing pain.     Rehab Potential  Good    PT Frequency  3x / week    PT Duration  8 weeks    PT Treatment/Interventions  ADLs/Self Care Home Management;Cryotherapy;Electrical Stimulation;Ultrasound;Moist Heat;Therapeutic activities;Therapeutic exercise;Functional mobility training;Neuromuscular re-education;Patient/family education;Passive range of motion;Manual techniques;Dry needling;Taping    PT Next Visit Plan  Manual techniques to address Rt shoulder/latissimus restrictions; lumbar and thoracic spine foam rolling, continue with higher function reaction; gentle posture strengthening; heat and stem as needed.  PT Home Exercise Plan  progress as needed    Consulted and Agree with Plan of Care  Patient       Patient will benefit from skilled therapeutic intervention in order to improve the following deficits and impairments:  Impaired flexibility, Difficulty walking, Decreased range of motion,  Decreased strength, Impaired UE functional use, Increased muscle spasms, Pain, Decreased activity tolerance  Visit Diagnosis: Pain in thoracic spine  Acute bilateral low back pain without sciatica  Acute pain of right shoulder  Pain in right hip     Problem List Patient Active Problem List   Diagnosis Date Noted  . Hypokalemia 02/13/2017  . Chronic right hip pain 11/26/2015  . Hyperlipidemia 12/23/2010  . ALLERGIC RHINITIS CAUSE UNSPECIFIED 07/24/2008  . COUGH 02/26/2008  . ACUTE BRONCHITIS 01/20/2008  . VIRAL URI 12/03/2007  . ACUTE SINUSITIS, UNSPECIFIED 10/14/2007  . GERD 10/14/2007  . IBS 10/14/2007    Earlie Counts, PT 09/24/17 9:28 AM   [facility] [address] [place], [place], [address] Phone: [phone]   Fax:  [phone]  Name: Brittany Rodgers MRN: [id number] Date of Birth: 1971-09-13

## 2017-09-26 ENCOUNTER — Ambulatory Visit: Payer: BLUE CROSS/BLUE SHIELD | Attending: Family Medicine

## 2017-09-26 DIAGNOSIS — M546 Pain in thoracic spine: Secondary | ICD-10-CM | POA: Diagnosis not present

## 2017-09-26 DIAGNOSIS — M25511 Pain in right shoulder: Secondary | ICD-10-CM | POA: Diagnosis present

## 2017-09-26 DIAGNOSIS — M25551 Pain in right hip: Secondary | ICD-10-CM | POA: Insufficient documentation

## 2017-09-26 DIAGNOSIS — M545 Low back pain, unspecified: Secondary | ICD-10-CM

## 2017-09-26 NOTE — Therapy (Signed)
[facility] [address], [place], [place], [address] Phone: [phone]   Fax:  [phone]  Physical Therapy Treatment  Patient Details  Name: Brittany Rodgers MRN: [id number] Date of Birth: 03-Feb-1971 Referring Provider: Alysia Penna, MD   Encounter Date: 09/26/2017  PT End of Session - 09/26/17 1228    Visit Number  16    Date for PT Re-Evaluation  10/03/17    PT Start Time  5397    PT Stop Time  6734    PT Time Calculation (min)  46 min    Activity Tolerance  Patient tolerated treatment well;No increased pain    Behavior During Therapy  WFL for tasks assessed/performed       Past Medical History:  Diagnosis Date  . Chronic cough   . GERD (gastroesophageal reflux disease)   . Hyperlipidemia   . IBS (irritable bowel syndrome)    sees Dr. Silvano Rusk   . Routine gynecological examination    sees Dr. Freda Munro    Past Surgical History:  Procedure Laterality Date  . CESAREAN SECTION    . ROOT CANAL      There were no vitals filed for this visit.  Subjective Assessment - 09/26/17 1150    Subjective  50% overall improvement in symptoms since the start of care.      Currently in Pain?  Yes    Pain Score  5     Pain Location  Back    Pain Orientation  Lower;Upper    Pain Descriptors / Indicators  Sore    Pain Type  Acute pain    Pain Onset  More than a month ago    Pain Frequency  Constant    Aggravating Factors   activity, repetative activity, lifting, reaching    Pain Relieving Factors  muscle relaxers (as needed), heat         [facility] PT Assessment - 09/26/17 0001      Observation/Other Assessments   Focus on Therapeutic Outcomes (FOTO)   56% limitation                  [facility] Adult PT Treatment/Exercise - 09/26/17 0001      Lumbar Exercises: Aerobic   UBE (Upper Arm Bike)  2 min forward/2 min backward level 0 sitting on a green physioball      Lumbar Exercises: Seated    Other Seated Lumbar Exercises  sit on green ball- pelvic sway, pelvic circles, pelvic diagonals, pelvic tilt      Shoulder Exercises: Seated   Extension  Strengthening;Both;10 reps;Weights sit on green ball    Extension Weight (lbs)  10 power tower    Retraction  Strengthening;Both;10 reps;Weights sitting on green ball    Retraction Weight (lbs)  15 power tower    Flexion  Right;Left;Weights;20 reps sitting on green ball    Flexion Weight (lbs)  1 to 90 degrees    Abduction  Right;Strengthening;Left;15 reps;Weights sitting on green ball    ABduction Weight (lbs)  1 to 90 degrees    Other Seated Exercises  sit on green ball- bil. shoulder horizontal abduction with PNF 5# two hand,       Moist Heat Therapy   Number Minutes Moist Heat  15 Minutes    Moist Heat Location  Shoulder back      Electrical Stimulation   Electrical Stimulation Location  right sided thoracic/teres region     Electrical Stimulation Action  IFC  Electrical Stimulation Parameters  15 minutes    Electrical Stimulation Goals  Pain               PT Panas Term Goals - 09/21/17 0903      PT Shingledecker TERM GOAL #2   Title  walk with dog for 15 minutes without increased LBP/thoracic pain    Time  4    Period  Weeks    Status  Achieved      PT Inglett TERM GOAL #5   Title  demonstrate Rt=Lt shoulder A/ROM flexion to allow for reaching overhead with Rt UE    Baseline  Pt able to reach overhead during today's session    Time  4    Period  Weeks    Status  Achieved        PT Long Term Goals - 09/26/17 1153      PT LONG TERM GOAL #1   Title  be independent in advanced HEP    Baseline  still learning    Time  8    Period  Weeks    Status  On-going      PT LONG TERM GOAL #3   Title  return to walking for exercise without limitation or increased pain    Time  8    Period  Weeks    Status  On-going      PT LONG TERM GOAL #4   Title  reach overhead with Rt UE without incresaed pain    Baseline  has  tigthness in rib cage    Time  8    Period  Weeks    Status  On-going            Plan - 09/26/17 1158    Clinical Impression Statement  Pt reports 50% overall improvement in her symptoms since the start of of care.  Pt is now able to do her housework/chores with less pain.  Pt with improved mobility and less stiffness with movement.  Pt will continue to benefit from skilled PT for strength, flexibility and modalities for pain.      Rehab Potential  Good    PT Frequency  3x / week    PT Duration  8 weeks    PT Treatment/Interventions  ADLs/Self Care Home Management;Cryotherapy;Electrical Stimulation;Ultrasound;Moist Heat;Therapeutic activities;Therapeutic exercise;Functional mobility training;Neuromuscular re-education;Patient/family education;Passive range of motion;Manual techniques;Dry needling;Taping    PT Next Visit Plan  Manual techniques to address Rt shoulder/latissimus restrictions; lumbar and thoracic spine foam rolling, continue with higher function reaction; gentle posture strengthening; heat and stem as needed.     Consulted and Agree with Plan of Care  Patient       Patient will benefit from skilled therapeutic intervention in order to improve the following deficits and impairments:  Impaired flexibility, Difficulty walking, Decreased range of motion, Decreased strength, Impaired UE functional use, Increased muscle spasms, Pain, Decreased activity tolerance  Visit Diagnosis: Pain in thoracic spine  Acute bilateral low back pain without sciatica  Acute pain of right shoulder  Pain in right hip     Problem List Patient Active Problem List   Diagnosis Date Noted  . Hypokalemia 02/13/2017  . Chronic right hip pain 11/26/2015  . Hyperlipidemia 12/23/2010  . ALLERGIC RHINITIS CAUSE UNSPECIFIED 07/24/2008  . COUGH 02/26/2008  . ACUTE BRONCHITIS 01/20/2008  . VIRAL URI 12/03/2007  . ACUTE SINUSITIS, UNSPECIFIED 10/14/2007  . GERD 10/14/2007  . IBS 10/14/2007     Sigurd Sos, PT 09/26/17 12:29  PM  [facility] [address], [place], [place], [address] Phone: [phone]   Fax:  [phone]  Name: Brittany Rodgers MRN: [id number] Date of Birth: Jul 06, 1971

## 2017-09-28 ENCOUNTER — Ambulatory Visit: Payer: BLUE CROSS/BLUE SHIELD | Admitting: Physical Therapy

## 2017-09-28 DIAGNOSIS — M545 Low back pain, unspecified: Secondary | ICD-10-CM

## 2017-09-28 DIAGNOSIS — M25511 Pain in right shoulder: Secondary | ICD-10-CM

## 2017-09-28 DIAGNOSIS — M546 Pain in thoracic spine: Secondary | ICD-10-CM

## 2017-09-28 DIAGNOSIS — M25551 Pain in right hip: Secondary | ICD-10-CM

## 2017-09-28 NOTE — Therapy (Signed)
[facility] [address], [place], [place], [address] Phone: [phone]   Fax:  [phone]  Physical Therapy Treatment  Patient Details  Name: Brittany Rodgers MRN: [id number] Date of Birth: 10/28/1970 Referring Provider: Alysia Penna, MD   Encounter Date: 09/28/2017  PT End of Session - 09/28/17 0934    Visit Number  17    Date for PT Re-Evaluation  10/03/17    Authorization Type  BCBS    PT Start Time  0805    PT Stop Time  0904    PT Time Calculation (min)  59 min    Activity Tolerance  Patient tolerated treatment well;No increased pain    Behavior During Therapy  WFL for tasks assessed/performed       Past Medical History:  Diagnosis Date  . Chronic cough   . GERD (gastroesophageal reflux disease)   . Hyperlipidemia   . IBS (irritable bowel syndrome)    sees Dr. Silvano Rusk   . Routine gynecological examination    sees Dr. Freda Munro    Past Surgical History:  Procedure Laterality Date  . CESAREAN SECTION    . ROOT CANAL      There were no vitals filed for this visit.  Subjective Assessment - 09/28/17 0808    Subjective  Pt reports that things are going well. Her upper back isn't too bad, but her lower back is a little sore from some of the exercises. She has not been trying heat or ice.     Currently in Pain?  Yes    Pain Score  6     Pain Location  Back    Pain Orientation  Lower;Right;Left    Pain Descriptors / Indicators  Sore    Pain Type  Acute pain    Pain Radiating Towards  none     Pain Onset  More than a month ago    Pain Frequency  Constant    Aggravating Factors   activity    Pain Relieving Factors  heat                      [facility] Adult PT Treatment/Exercise - 09/28/17 0001      Lumbar Exercises: Supine   Other Supine Lumbar Exercises  Scapular retraction over foam roll x15 reps, snow angels over foam roll x15 reps, rolling thoracic and lumbar spine over soft  foam roll      Shoulder Exercises: Seated   Extension  Strengthening;Both;10 reps;Limitations    Extension Weight (lbs)  10    Extension Limitations  x2 sets, sitting on red physioball     Retraction  Both;10 reps;Limitations    Retraction Weight (lbs)  10 power tower    Retraction Limitations  x2 sets, sitting on red physioball       Electrical Stimulation   Electrical Stimulation Location  right sided thoracic/teres region     Electrical Stimulation Action  IFC    Electrical Stimulation Parameters  15 min, to pt tolerance     Electrical Stimulation Goals  Pain      Manual Therapy   Soft tissue mobilization  STM lumbar paraspinals/latissimus             PT Education - 09/28/17 0907    Education provided  Yes    Education Details  technique with therex    Person(s) Educated  Patient    Methods  Explanation    Comprehension  Returned demonstration  PT Dimascio Term Goals - 09/21/17 6433      PT Sherburn TERM GOAL #2   Title  walk with dog for 15 minutes without increased LBP/thoracic pain    Time  4    Period  Weeks    Status  Achieved      PT Leary TERM GOAL #5   Title  demonstrate Rt=Lt shoulder A/ROM flexion to allow for reaching overhead with Rt UE    Baseline  Pt able to reach overhead during today's session    Time  4    Period  Weeks    Status  Achieved        PT Long Term Goals - 09/26/17 1153      PT LONG TERM GOAL #1   Title  be independent in advanced HEP    Baseline  still learning    Time  8    Period  Weeks    Status  On-going      PT LONG TERM GOAL #3   Title  return to walking for exercise without limitation or increased pain    Time  8    Period  Weeks    Status  On-going      PT LONG TERM GOAL #4   Title  reach overhead with Rt UE without incresaed pain    Baseline  has tigthness in rib cage    Time  8    Period  Weeks    Status  On-going            Plan - 09/28/17 0935    Clinical Impression Statement  Today's  session focused on therex to further promote pain free movement and increase activity participation. Pt was able to complete increased sets with several of her exercises, reporting no increase in pain during this. Pt responded well to foam rolling and soft tissue mobilization end of session which she noted decreased the tightness in her low back. Will continue with current POC.     Rehab Potential  Good    PT Frequency  3x / week    PT Duration  8 weeks    PT Treatment/Interventions  ADLs/Self Care Home Management;Cryotherapy;Electrical Stimulation;Ultrasound;Moist Heat;Therapeutic activities;Therapeutic exercise;Functional mobility training;Neuromuscular re-education;Patient/family education;Passive range of motion;Manual techniques;Dry needling;Taping    PT Next Visit Plan  Manual techniques to address Rt shoulder/latissimus restrictions; lat/shoulder stretch; lumbar and thoracic spine foam rolling, continue with higher function reaction; gentle posture strengthening; heat and stem as needed.     Consulted and Agree with Plan of Care  Patient       Patient will benefit from skilled therapeutic intervention in order to improve the following deficits and impairments:  Impaired flexibility, Difficulty walking, Decreased range of motion, Decreased strength, Impaired UE functional use, Increased muscle spasms, Pain, Decreased activity tolerance  Visit Diagnosis: Pain in thoracic spine  Acute bilateral low back pain without sciatica  Acute pain of right shoulder  Pain in right hip     Problem List Patient Active Problem List   Diagnosis Date Noted  . Hypokalemia 02/13/2017  . Chronic right hip pain 11/26/2015  . Hyperlipidemia 12/23/2010  . ALLERGIC RHINITIS CAUSE UNSPECIFIED 07/24/2008  . COUGH 02/26/2008  . ACUTE BRONCHITIS 01/20/2008  . VIRAL URI 12/03/2007  . ACUTE SINUSITIS, UNSPECIFIED 10/14/2007  . GERD 10/14/2007  . IBS 10/14/2007   9:41 AM,09/28/17 Elly Modena PT, DPT [facility] [address], [place], [place], [address] Phone: [phone]   Fax:  [phone]  Name: Brittany Rodgers MRN: [id number] Date of Birth: 1971/05/28

## 2017-10-01 ENCOUNTER — Ambulatory Visit: Payer: BLUE CROSS/BLUE SHIELD | Admitting: Physical Therapy

## 2017-10-01 DIAGNOSIS — M25551 Pain in right hip: Secondary | ICD-10-CM

## 2017-10-01 DIAGNOSIS — M546 Pain in thoracic spine: Secondary | ICD-10-CM

## 2017-10-01 DIAGNOSIS — M545 Low back pain, unspecified: Secondary | ICD-10-CM

## 2017-10-01 DIAGNOSIS — M25511 Pain in right shoulder: Secondary | ICD-10-CM

## 2017-10-01 NOTE — Therapy (Signed)
[facility] [address] [place], [place], [address] Phone: [phone]   Fax:  [phone]  Physical Therapy Treatment  Patient Details  Name: Brittany Rodgers MRN: [id number] Date of Birth: March 06, 1971 Referring Provider: Alysia Penna, MD   Encounter Date: 10/01/2017  PT End of Session - 10/01/17 0806    Visit Number  18    Date for PT Re-Evaluation  10/03/17    Authorization Type  BCBS    PT Start Time  0803    PT Stop Time  0900    PT Time Calculation (min)  57 min    Activity Tolerance  Patient tolerated treatment well;No increased pain    Behavior During Therapy  WFL for tasks assessed/performed       Past Medical History:  Diagnosis Date  . Chronic cough   . GERD (gastroesophageal reflux disease)   . Hyperlipidemia   . IBS (irritable bowel syndrome)    sees Dr. Silvano Rusk   . Routine gynecological examination    sees Dr. Freda Munro    Past Surgical History:  Procedure Laterality Date  . CESAREAN SECTION    . ROOT CANAL      There were no vitals filed for this visit.  Subjective Assessment - 10/01/17 0809    Subjective  Pt reports that things are going well. She was a little sore, but this has improved alot over the weekend.     Currently in Pain?  Other (Comment) No rating given, but no worse than last session    Pain Onset  More than a month ago             [facility] Adult PT Treatment/Exercise - 10/01/17 0001      Lumbar Exercises: Aerobic   Elliptical  L1, x5 min forward, ramp 4      Shoulder Exercises: Standing   Extension  Both;10 reps;Strengthening;Limitations    Extension Limitations  standing on foam, power towel 10#    Row  Strengthening;Both;10 reps;Limitations    Row Limitations  standing on foam pad, power tower 15#    Other Standing Exercises  D2 flexion x10 reps each side       Shoulder Exercises: Stretch   Cross Chest Stretch  2 reps;20 seconds    Other Shoulder  Stretches  tricep stretch, Lt/Rt 2x30 sec each       Moist Heat Therapy   Number Minutes Moist Heat  15 Minutes Rt shoulder, lumbar region during E-stim      Electrical Stimulation   Electrical Stimulation Location  right sided thoracic/teres region/Rt shoulder girdle     Electrical Stimulation Action  IFC    Electrical Stimulation Parameters  15 min, to pt tolerance     Electrical Stimulation Goals  Pain      Manual Therapy   Soft tissue mobilization  STM, gentle TPR Rt teres region/latissimus              PT Education - 10/01/17 0806    Education provided  Yes    Education Details  upcoming re-evaluation     Person(s) Educated  Patient    Methods  Explanation    Comprehension  Verbalized understanding       PT Willig Term Goals - 09/21/17 0903      PT Azimi TERM GOAL #2   Title  walk with dog for 15 minutes without increased LBP/thoracic pain    Time  4    Period  Weeks  Status  Achieved      PT Badour TERM GOAL #5   Title  demonstrate Rt=Lt shoulder A/ROM flexion to allow for reaching overhead with Rt UE    Baseline  Pt able to reach overhead during today's session    Time  4    Period  Weeks    Status  Achieved        PT Long Term Goals - 09/26/17 1153      PT LONG TERM GOAL #1   Title  be independent in advanced HEP    Baseline  still learning    Time  8    Period  Weeks    Status  On-going      PT LONG TERM GOAL #3   Title  return to walking for exercise without limitation or increased pain    Time  8    Period  Weeks    Status  On-going      PT LONG TERM GOAL #4   Title  reach overhead with Rt UE without incresaed pain    Baseline  has tigthness in rib cage    Time  8    Period  Weeks    Status  On-going            Plan - 10/01/17 1219    Clinical Impression Statement  Pt continues to complete her HEP at home. This session, she was able to complete progressions of standing therex, which was completed without any report of increased  soreness/pain. Continue to noted restrictions along Rt posterior shoulder and completed manual techniques to decrease muscle spasm/pain. Will complete re-evaluation at next session with potential extension of POC.     Rehab Potential  Good    PT Frequency  3x / week    PT Duration  8 weeks    PT Treatment/Interventions  ADLs/Self Care Home Management;Cryotherapy;Electrical Stimulation;Ultrasound;Moist Heat;Therapeutic activities;Therapeutic exercise;Functional mobility training;Neuromuscular re-education;Patient/family education;Passive range of motion;Manual techniques;Dry needling;Taping    PT Next Visit Plan  re-evaluation    Consulted and Agree with Plan of Care  Patient       Patient will benefit from skilled therapeutic intervention in order to improve the following deficits and impairments:  Impaired flexibility, Difficulty walking, Decreased range of motion, Decreased strength, Impaired UE functional use, Increased muscle spasms, Pain, Decreased activity tolerance  Visit Diagnosis: Pain in thoracic spine  Acute bilateral low back pain without sciatica  Acute pain of right shoulder  Pain in right hip     Problem List Patient Active Problem List   Diagnosis Date Noted  . Hypokalemia 02/13/2017  . Chronic right hip pain 11/26/2015  . Hyperlipidemia 12/23/2010  . ALLERGIC RHINITIS CAUSE UNSPECIFIED 07/24/2008  . COUGH 02/26/2008  . ACUTE BRONCHITIS 01/20/2008  . VIRAL URI 12/03/2007  . ACUTE SINUSITIS, UNSPECIFIED 10/14/2007  . GERD 10/14/2007  . IBS 10/14/2007    8:48 AM,10/01/17 Elly Modena PT, DPT [place] [facility] [address], [place], [place], [address] Phone: [phone]   Fax:  [phone]  Name: Brittany Rodgers MRN: [id number] Date of Birth: 05/05/1971

## 2017-10-01 NOTE — Patient Instructions (Signed)
   Shoulder band PNF D2 flexion  Stand on a band with your opposite foot to the arm your are working and your affected hand at the opposite pocket, lift your arm our in a diagonal like you are drawing our a sword.  Your hand should turn outward during the lifting of the arm.  Slowly lower back down to your opposite pocket again.  x10 reps each direction. 1x/day   [place] [address] [place], [place] [address] Phone # [phone] Fax [phone]

## 2017-10-03 ENCOUNTER — Ambulatory Visit: Payer: BLUE CROSS/BLUE SHIELD | Admitting: Physical Therapy

## 2017-10-03 ENCOUNTER — Encounter: Payer: Self-pay | Admitting: Physical Therapy

## 2017-10-03 DIAGNOSIS — M546 Pain in thoracic spine: Secondary | ICD-10-CM

## 2017-10-03 DIAGNOSIS — M25551 Pain in right hip: Secondary | ICD-10-CM

## 2017-10-03 DIAGNOSIS — M545 Low back pain, unspecified: Secondary | ICD-10-CM

## 2017-10-03 DIAGNOSIS — M25511 Pain in right shoulder: Secondary | ICD-10-CM

## 2017-10-03 NOTE — Therapy (Signed)
[facility] [address] [place], [place], [address] Phone: [phone]   Fax:  [phone]  Physical Therapy Treatment/reassessment  Patient Details  Name: Brittany Rodgers MRN: [id number] Date of Birth: [birth year] Referring Provider: Alysia Penna, MD    Encounter Date: 10/03/2017  PT End of Session - 10/03/17 1214    Visit Number  19    Date for PT Re-Evaluation  11/15/17    Authorization Type  BCBS    Authorization Time Period  10/04/17 to 11/15/17    PT Start Time  1147    PT Stop Time  1238    PT Time Calculation (min)  51 min    Activity Tolerance  Patient tolerated treatment well;No increased pain    Behavior During Therapy  WFL for tasks assessed/performed       Past Medical History:  Diagnosis Date  . Chronic cough   . GERD (gastroesophageal reflux disease)   . Hyperlipidemia   . IBS (irritable bowel syndrome)    sees Dr. Silvano Rusk   . Routine gynecological examination    sees Dr. Freda Munro    Past Surgical History:  Procedure Laterality Date  . CESAREAN SECTION    . ROOT CANAL      There were no vitals filed for this visit.  Subjective Assessment - 10/03/17 1156    Subjective  Pt states that she can tell a big difference in her activity tolerance and pain since starting therapy. She is now completing atleast 50% more housework.     Patient Stated Goals  improved strength and pain free     Currently in Pain?  Yes    Pain Score  5     Pain Location  Back Rt shoulder and mid back    Pain Orientation  Right;Mid    Pain Descriptors / Indicators  Sore    Pain Type  Acute pain    Pain Radiating Towards  none     Pain Onset  More than a month ago    Pain Frequency  Intermittent    Aggravating Factors   walking, alot of overhead activity    Pain Relieving Factors  heat, avoiding aggravating movements     Effect of Pain on Daily Activities  only 50% involvement in household/leisure activity      Multiple Pain Sites  No         [facility] PT Assessment - 10/03/17 0001      Assessment   Medical Diagnosis  thoracic myofascial strain, strain of lumbar region, strain of Rt shoulder    Referring Provider  Alysia Penna, MD     Onset Date/Surgical Date  07/15/17    Hand Dominance  Right    Next MD Visit  none    Prior Therapy  none      Precautions   Precautions  None      Restrictions   Weight Bearing Restrictions  No      Balance Screen   Has the patient fallen in the past 6 months  No    Has the patient had a decrease in activity level because of a fear of falling?   No    Is the patient reluctant to leave their home because of a fear of falling?   No      Prior Function   Level of Independence  Independent    Vocation  Full time employment    Vocation Requirements  phone operator-desk work  Leisure  gardending, walking, sewing      Cognition   Overall Cognitive Status  Within Functional Limits for tasks assessed      Observation/Other Assessments   Focus on Therapeutic Outcomes (FOTO)   45% limited       Posture/Postural Control   Posture/Postural Control  Postural limitations    Postural Limitations  Increased lumbar lordosis      AROM   Overall AROM   Deficits    Overall AROM Comments  Rt shoulder flexion 145 deg (tightness reported); abduction full       PROM   Overall PROM   --    Overall PROM Comments  --      Strength   Overall Strength  --    Overall Strength Comments  Lt shoulder 5/5, Rt shoulder 5/5 except IR/ER 4/5 MMT (pain posterior shoulder)       Palpation   Spinal mobility  --    Palpation comment  tenderness with palpation along Rt teres/periscapular region       Transfers   Transfers  Independent with all Transfers      Ambulation/Gait   Ambulation/Gait  --    Gait Pattern  --                  [facility] Adult PT Treatment/Exercise - 10/03/17 0001      Moist Heat Therapy   Number Minutes Moist Heat  15 Minutes    Moist  Heat Location  Shoulder Rt posterior shoulder       Electrical Stimulation   Electrical Stimulation Location  Rt shoulder girdle    Electrical Stimulation Action  IFC    Electrical Stimulation Parameters  15 min, to pt tolerance     Electrical Stimulation Goals  Pain             PT Education - 10/03/17 1557    Education provided  Yes    Education Details  goals met and POC moving forward; importance of easing back into activity such as walking, etc.    Person(s) Educated  Patient    Methods  Explanation    Comprehension  Verbalized understanding       PT Rampey Term Goals - 10/03/17 1207      PT Lina TERM GOAL #1   Title  be independent in initial HEP    Time  4    Period  Weeks    Status  Achieved      PT Hagenow TERM GOAL #2   Title  walk with dog for 15 minutes without increased LBP/thoracic pain    Time  4    Period  Weeks    Status  Achieved      PT Palermo TERM GOAL #3   Title  demonstrate symmetry with ambulation due to reduced Rt hip pain    Time  4    Period  Weeks    Status  Achieved      PT Roudebush TERM GOAL #4   Title  report > or = to 30% reduction in thoracic/lumbar pain with work and home tasks    Time  4    Period  Weeks    Status  Achieved      PT Fonte TERM GOAL #5   Title  demonstrate Rt=Lt shoulder A/ROM flexion to allow for reaching overhead with Rt UE    Baseline  Pt able to reach overhead during today's session    Time  4  Period  Weeks    Status  Achieved        PT Long Term Goals - 10/03/17 1209      PT LONG TERM GOAL #1   Title  be independent in advanced HEP    Baseline  still learning    Time  6    Period  Weeks    Status  On-going    Target Date  11/15/17      PT LONG TERM GOAL #2   Title  reduce FOTO to < or = to 34% limitation    Time  6    Period  Weeks    Status  On-going      PT LONG TERM GOAL #3   Title  return to walking for exercise without limitation or increased pain    Baseline  walking some but not as  much as she was prior, less than half a mile     Time  6    Period  Weeks    Status  On-going      PT LONG TERM GOAL #4   Title  reach overhead with Rt UE without incresaed pain    Baseline  tightness Rt posterior shoulder     Time  8    Period  Weeks    Status  On-going      PT LONG TERM GOAL #5   Title  stand for > or = to 45 minutes without limitation     Time  6    Period  Weeks    Status  On-going      PT LONG TERM GOAL #6   Title  report a 75% reduction in thoracic/lumbar pain with ADLs and self-care    Baseline  50% improved    Time  6    Period  Weeks    Status  Partially Met            Plan - 10/03/17 1558    Clinical Impression Statement  Pt was reassessed this session having met several of her [place] and long term goals since beginning PT. She feels 50% improved overall and is doing much more around the house. She has not quite gotten back into walking or her other recreational activity at this time. She is recently able to complete more strengthening therex without difficulty. Continue to notice tenderness along the Rt posterior shoulder which is contributing to lack of Rt shoulder flexion ROM to 145 deg. She would benefit from continued skilled PT to progress towards an advanced HEP and provide intervention to decrease remaining areas of pain and improve UE functional use.     Rehab Potential  Good    PT Frequency  3x / week    PT Duration  8 weeks    PT Treatment/Interventions  ADLs/Self Care Home Management;Cryotherapy;Electrical Stimulation;Ultrasound;Moist Heat;Therapeutic activities;Therapeutic exercise;Functional mobility training;Neuromuscular re-education;Patient/family education;Passive range of motion;Manual techniques;Dry needling;Taping    PT Next Visit Plan  Rt posterior shoulder (manual techniques); progression of postural strengthening; walking on treadmill/elliptical for warm-up    Consulted and Agree with Plan of Care  Patient       Patient will  benefit from skilled therapeutic intervention in order to improve the following deficits and impairments:  Impaired flexibility, Difficulty walking, Decreased range of motion, Decreased strength, Impaired UE functional use, Increased muscle spasms, Pain, Decreased activity tolerance  Visit Diagnosis: Pain in thoracic spine  Acute bilateral low back pain without sciatica  Acute pain of right shoulder  Pain in right hip     Problem List Patient Active Problem List   Diagnosis Date Noted  . Hypokalemia 02/13/2017  . Chronic right hip pain 11/26/2015  . Hyperlipidemia 12/23/2010  . ALLERGIC RHINITIS CAUSE UNSPECIFIED 07/24/2008  . COUGH 02/26/2008  . ACUTE BRONCHITIS 01/20/2008  . VIRAL URI 12/03/2007  . ACUTE SINUSITIS, UNSPECIFIED 10/14/2007  . GERD 10/14/2007  . IBS 10/14/2007    4:07 PM,10/03/17 Elly Modena PT, DPT [place] [facility] [address] [place], [place], [address] Phone: [phone]   Fax:  [phone]  Name: Brittany Rodgers MRN: [id number] Date of Birth: 04-15-1971

## 2017-10-08 ENCOUNTER — Ambulatory Visit: Payer: BLUE CROSS/BLUE SHIELD | Admitting: Physical Therapy

## 2017-10-08 DIAGNOSIS — M545 Low back pain, unspecified: Secondary | ICD-10-CM

## 2017-10-08 DIAGNOSIS — M25511 Pain in right shoulder: Secondary | ICD-10-CM

## 2017-10-08 DIAGNOSIS — M546 Pain in thoracic spine: Secondary | ICD-10-CM

## 2017-10-08 DIAGNOSIS — M25551 Pain in right hip: Secondary | ICD-10-CM

## 2017-10-08 NOTE — Therapy (Signed)
[facility] [address] [place], [place], [address] Phone: [phone]   Fax:  [phone]  Physical Therapy Treatment  Patient Details  Name: Brittany Rodgers MRN: [id number] Date of Birth: 04-11-1971 Referring Provider: Alysia Penna, MD    Encounter Date: 10/08/2017  PT End of Session - 10/08/17 1515    Visit Number  20    Date for PT Re-Evaluation  11/15/17    Authorization Type  BCBS    Authorization Time Period  10/04/17 to 11/15/17    PT Start Time  1447    PT Stop Time  1545    PT Time Calculation (min)  58 min    Activity Tolerance  Patient tolerated treatment well;No increased pain    Behavior During Therapy  WFL for tasks assessed/performed       Past Medical History:  Diagnosis Date  . Chronic cough   . GERD (gastroesophageal reflux disease)   . Hyperlipidemia   . IBS (irritable bowel syndrome)    sees Dr. Silvano Rusk   . Routine gynecological examination    sees Dr. Freda Munro    Past Surgical History:  Procedure Laterality Date  . CESAREAN SECTION    . ROOT CANAL      There were no vitals filed for this visit.  Subjective Assessment - 10/08/17 1453    Subjective  Pt reports that things are going well.     Patient Stated Goals  improved strength and pain free     Pain Onset  More than a month ago                      [facility] Adult PT Treatment/Exercise - 10/08/17 0001      Shoulder Exercises: Standing   Row  Both;10 reps;Strengthening    Row Limitations  standing on foam pad, 15# power tower      Shoulder Exercises: ROM/Strengthening   UBE (Upper Arm Bike)  sitting on physioball x2 min forward/backward PT present to discuss post session expectations of soreness    Other ROM/Strengthening Exercises  Lt/Rt shoulder D2/D1 diagonals x10 reps each with 5# on power tower      Shoulder Exercises: Stretch   Other Shoulder Stretches  child's pose stretch 3x20 sec each side    Other Shoulder Stretches  child's pose thoracic extension stretch over foam roll x10 reps       Moist Heat Therapy   Number Minutes Moist Heat  15 Minutes    Moist Heat Location  Other (comment) Rt shoulder girdle/low back      Electrical Stimulation   Electrical Stimulation Location  Rt shoulder girdle    Electrical Stimulation Action  IFC    Electrical Stimulation Parameters  15 min, to pt tolerance    Electrical Stimulation Goals  Pain      Manual Therapy   Soft tissue mobilization  STM Rt teres/latissimus             PT Education - 10/08/17 1515    Education provided  Yes    Education Details  discussed expectations of muscle soreness for 1-2 days following sessions with new exercises    Person(s) Educated  Patient    Methods  Explanation    Comprehension  Verbalized understanding       PT Blower Term Goals - 10/03/17 1207      PT Ferger TERM GOAL #1   Title  be independent in initial HEP  Time  4    Period  Weeks    Status  Achieved      PT Levesque TERM GOAL #2   Title  walk with dog for 15 minutes without increased LBP/thoracic pain    Time  4    Period  Weeks    Status  Achieved      PT Spake TERM GOAL #3   Title  demonstrate symmetry with ambulation due to reduced Rt hip pain    Time  4    Period  Weeks    Status  Achieved      PT Fraser TERM GOAL #4   Title  report > or = to 30% reduction in thoracic/lumbar pain with work and home tasks    Time  4    Period  Weeks    Status  Achieved      PT Hudler TERM GOAL #5   Title  demonstrate Rt=Lt shoulder A/ROM flexion to allow for reaching overhead with Rt UE    Baseline  Pt able to reach overhead during today's session    Time  4    Period  Weeks    Status  Achieved        PT Long Term Goals - 10/03/17 1209      PT LONG TERM GOAL #1   Title  be independent in advanced HEP    Baseline  still learning    Time  6    Period  Weeks    Status  On-going    Target Date  11/15/17      PT LONG TERM  GOAL #2   Title  reduce FOTO to < or = to 34% limitation    Time  6    Period  Weeks    Status  On-going      PT LONG TERM GOAL #3   Title  return to walking for exercise without limitation or increased pain    Baseline  walking some but not as much as she was prior, less than half a mile     Time  6    Period  Weeks    Status  On-going      PT LONG TERM GOAL #4   Title  reach overhead with Rt UE without incresaed pain    Baseline  tightness Rt posterior shoulder     Time  8    Period  Weeks    Status  On-going      PT LONG TERM GOAL #5   Title  stand for > or = to 45 minutes without limitation     Time  6    Period  Weeks    Status  On-going      PT LONG TERM GOAL #6   Title  report a 75% reduction in thoracic/lumbar pain with ADLs and self-care    Baseline  50% improved    Time  6    Period  Weeks    Status  Partially Met            Plan - 10/08/17 1539    Clinical Impression Statement  Today's session continued with focus on progressing shoulder/upper trunk strengthening. Pt was able to perform new exercises correctly and without report of increase in pain/stiffness of the Rt shoulder. Continue to note soreness in latissimus, teres and Rt periscapular musculature, so therapist completed manual treatment to the area to decrease this. Also encouraged tennis ball self-massage and provided handout to  reinforce this at home, pt verbalizing good understanding.     Rehab Potential  Good    PT Frequency  3x / week    PT Duration  8 weeks    PT Treatment/Interventions  ADLs/Self Care Home Management;Cryotherapy;Electrical Stimulation;Ultrasound;Moist Heat;Therapeutic activities;Therapeutic exercise;Functional mobility training;Neuromuscular re-education;Patient/family education;Passive range of motion;Manual techniques;Dry needling;Taping    PT Next Visit Plan  f/u on shoulder massage with tennis ball; Rt posterior shoulder (manual techniques); progression of postural  strengthening; walking on treadmill/elliptical for warm-up    PT Home Exercise Plan  tennis ball massage    Consulted and Agree with Plan of Care  Patient       Patient will benefit from skilled therapeutic intervention in order to improve the following deficits and impairments:  Impaired flexibility, Difficulty walking, Decreased range of motion, Decreased strength, Impaired UE functional use, Increased muscle spasms, Pain, Decreased activity tolerance  Visit Diagnosis: Pain in thoracic spine  Acute bilateral low back pain without sciatica  Acute pain of right shoulder  Pain in right hip     Problem List Patient Active Problem List   Diagnosis Date Noted  . Hypokalemia 02/13/2017  . Chronic right hip pain 11/26/2015  . Hyperlipidemia 12/23/2010  . ALLERGIC RHINITIS CAUSE UNSPECIFIED 07/24/2008  . COUGH 02/26/2008  . ACUTE BRONCHITIS 01/20/2008  . VIRAL URI 12/03/2007  . ACUTE SINUSITIS, UNSPECIFIED 10/14/2007  . GERD 10/14/2007  . IBS 10/14/2007    3:56 PM,10/08/17 Elly Modena PT, DPT [place] [facility] [address] [place], [place], [address] Phone: [phone]   Fax:  [phone]  Name: Brittany Rodgers MRN: [id number] Date of Birth: [birth year]

## 2017-10-08 NOTE — Patient Instructions (Signed)
   Palmer the tennis ball on the wall and bend your knees up and down so the ball rolls over the area that is sore. Be careful not to use your shoulder to move the ball, it should be your whole body moving.   Up to 5 min each day.   [facility] [address], [place],  [address] Phone # [phone] Fax [phone]

## 2017-10-17 ENCOUNTER — Ambulatory Visit: Payer: BLUE CROSS/BLUE SHIELD | Admitting: Physical Therapy

## 2017-10-17 ENCOUNTER — Encounter: Payer: Self-pay | Admitting: Physical Therapy

## 2017-10-17 DIAGNOSIS — M25511 Pain in right shoulder: Secondary | ICD-10-CM

## 2017-10-17 DIAGNOSIS — M25551 Pain in right hip: Secondary | ICD-10-CM

## 2017-10-17 DIAGNOSIS — M545 Low back pain, unspecified: Secondary | ICD-10-CM

## 2017-10-17 DIAGNOSIS — M546 Pain in thoracic spine: Secondary | ICD-10-CM | POA: Diagnosis not present

## 2017-10-17 NOTE — Patient Instructions (Signed)
   ELASTIC BAND SHOULDER  DIAGONAL - FLEXION ABDUCTION - SELF FIX  (Yellow band)  Start by holding an elastic band down by your side to fixate it with your uninvolved arm. Next, using the involved arm, draw the other end of the band upwards and towards the opposite side as shown. x15 reps      ELASTIC BAND ROWS (Green Band)  Holding elastic band with both hands, draw back the band as you bend your elbows. Keep your elbows near the side of your body.  x20 reps.        ELASTIC BAND  W's - ER (yellow band)  Holding elastic band with both hands, draw back the band as you bend your elbows and retract your shoulder blades as shown.  x10 reps    [facility] [address] [place], [place] [address] Phone # [phone] Fax [phone]

## 2017-10-17 NOTE — Therapy (Signed)
[facility] [address], [place], [place], [address] Phone: [phone]   Fax:  [phone]  Physical Therapy Treatment  Patient Details  Name: Brittany Rodgers MRN: [id number] Date of Birth: 05/04/1971 Referring Provider: Alysia Penna, MD    Encounter Date: 10/17/2017  PT End of Session - 10/17/17 0948    Visit Number  21    Date for PT Re-Evaluation  11/15/17    Authorization Type  BCBS    Authorization Time Period  10/04/17 to 11/15/17    PT Start Time  0851    PT Stop Time  0945    PT Time Calculation (min)  54 min    Activity Tolerance  Patient tolerated treatment well;No increased pain    Behavior During Therapy  WFL for tasks assessed/performed       Past Medical History:  Diagnosis Date  . Chronic cough   . GERD (gastroesophageal reflux disease)   . Hyperlipidemia   . IBS (irritable bowel syndrome)    sees Dr. Silvano Rusk   . Routine gynecological examination    sees Dr. Freda Munro    Past Surgical History:  Procedure Laterality Date  . CESAREAN SECTION    . ROOT CANAL      There were no vitals filed for this visit.  Subjective Assessment - 10/17/17 0854    Subjective  Pt reports that she has had a busy week, but continues to complete her exercises.    Patient Stated Goals  improved strength and pain free     Currently in Pain?  Other (Comment) none noted     Pain Onset  More than a month ago                      [facility] Adult PT Treatment/Exercise - 10/17/17 0001      Therapeutic Activites    Therapeutic Activities  Lifting    Lifting  lifting 10# box x5 reps, therapist       Lumbar Exercises: Stretches   Hip Flexor Stretch  3 reps;Right;30 seconds      Shoulder Exercises: Standing   External Rotation  Both;10 reps;Theraband;Limitations    Theraband Level (Shoulder External Rotation)  Level 1 (Yellow)    External Rotation Limitations  x2 sets     Row  Both;20  reps;Theraband    Theraband Level (Shoulder Row)  Level 3 (Green)    Row Limitations  x2 sets       Shoulder Exercises: ROM/Strengthening   UBE (Upper Arm Bike)  sitting on physioball x2 min forward/backward    Other ROM/Strengthening Exercises  shoulder diagonals with yellow TB 2x10 reps each       Moist Heat Therapy   Number Minutes Moist Heat  15 Minutes    Moist Heat Location  Other (comment) low back and Rt shoulder       Electrical Stimulation   Electrical Stimulation Location  Rt shoulder girdle    Electrical Stimulation Action  IFC    Electrical Stimulation Parameters  15 min, to pt tolerance     Electrical Stimulation Goals  Pain             PT Education - 10/17/17 0935    Education provided  Yes    Education Details  discussed purchasing TENS unit for home if interested    Person(s) Educated  Patient    Methods  Explanation    Comprehension  Verbalized understanding  PT Katich Term Goals - 10/03/17 1207      PT Schlachter TERM GOAL #1   Title  be independent in initial HEP    Time  4    Period  Weeks    Status  Achieved      PT Moroni TERM GOAL #2   Title  walk with dog for 15 minutes without increased LBP/thoracic pain    Time  4    Period  Weeks    Status  Achieved      PT Estey TERM GOAL #3   Title  demonstrate symmetry with ambulation due to reduced Rt hip pain    Time  4    Period  Weeks    Status  Achieved      PT Therrell TERM GOAL #4   Title  report > or = to 30% reduction in thoracic/lumbar pain with work and home tasks    Time  4    Period  Weeks    Status  Achieved      PT Milanes TERM GOAL #5   Title  demonstrate Rt=Lt shoulder A/ROM flexion to allow for reaching overhead with Rt UE    Baseline  Pt able to reach overhead during today's session    Time  4    Period  Weeks    Status  Achieved        PT Long Term Goals - 10/03/17 1209      PT LONG TERM GOAL #1   Title  be independent in advanced HEP    Baseline  still learning     Time  6    Period  Weeks    Status  On-going    Target Date  11/15/17      PT LONG TERM GOAL #2   Title  reduce FOTO to < or = to 34% limitation    Time  6    Period  Weeks    Status  On-going      PT LONG TERM GOAL #3   Title  return to walking for exercise without limitation or increased pain    Baseline  walking some but not as much as she was prior, less than half a mile     Time  6    Period  Weeks    Status  On-going      PT LONG TERM GOAL #4   Title  reach overhead with Rt UE without incresaed pain    Baseline  tightness Rt posterior shoulder     Time  8    Period  Weeks    Status  On-going      PT LONG TERM GOAL #5   Title  stand for > or = to 45 minutes without limitation     Time  6    Period  Weeks    Status  On-going      PT LONG TERM GOAL #6   Title  report a 75% reduction in thoracic/lumbar pain with ADLs and self-care    Baseline  50% improved    Time  6    Period  Weeks    Status  Partially Met            Plan - 10/17/17 0951    Clinical Impression Statement  Pt arrived today not feeling well, however she was able to complete this session's exercises without significant difficulty. Pt is having trouble keeping up with all of her exercises, so therapist  updated these to ensure proper focus on remaining limitations. Pt had good understanding of this. Pt reported no increase in pain, noting muscle fatigue only with today's exercises. Will continue with current POC.    Rehab Potential  Good    PT Frequency  3x / week    PT Duration  8 weeks    PT Treatment/Interventions  ADLs/Self Care Home Management;Cryotherapy;Electrical Stimulation;Ultrasound;Moist Heat;Therapeutic activities;Therapeutic exercise;Functional mobility training;Neuromuscular re-education;Patient/family education;Passive range of motion;Manual techniques;Dry needling;Taping    PT Next Visit Plan  TENS info, STM Rt posterior shoulder; progression of shoulder strength     PT Home Exercise  Plan  rows, diagonals, Ws    Consulted and Agree with Plan of Care  Patient       Patient will benefit from skilled therapeutic intervention in order to improve the following deficits and impairments:  Impaired flexibility, Difficulty walking, Decreased range of motion, Decreased strength, Impaired UE functional use, Increased muscle spasms, Pain, Decreased activity tolerance  Visit Diagnosis: Pain in thoracic spine  Acute bilateral low back pain without sciatica  Acute pain of right shoulder  Pain in right hip     Problem List Patient Active Problem List   Diagnosis Date Noted  . Hypokalemia 02/13/2017  . Chronic right hip pain 11/26/2015  . Hyperlipidemia 12/23/2010  . ALLERGIC RHINITIS CAUSE UNSPECIFIED 07/24/2008  . COUGH 02/26/2008  . ACUTE BRONCHITIS 01/20/2008  . VIRAL URI 12/03/2007  . ACUTE SINUSITIS, UNSPECIFIED 10/14/2007  . GERD 10/14/2007  . IBS 10/14/2007   9:58 AM,10/17/17 Sherol Dade PT, DPT [place] [facility] [address], [place], [place], [address] Phone: [phone]   Fax:  [phone]  Name: Brittany Rodgers MRN: [id number] Date of Birth: [birth year]

## 2017-10-19 ENCOUNTER — Ambulatory Visit: Payer: BLUE CROSS/BLUE SHIELD | Admitting: Physical Therapy

## 2017-10-19 ENCOUNTER — Encounter: Payer: Self-pay | Admitting: Physical Therapy

## 2017-10-19 DIAGNOSIS — M25551 Pain in right hip: Secondary | ICD-10-CM

## 2017-10-19 DIAGNOSIS — M546 Pain in thoracic spine: Secondary | ICD-10-CM | POA: Diagnosis not present

## 2017-10-19 DIAGNOSIS — M545 Low back pain, unspecified: Secondary | ICD-10-CM

## 2017-10-19 DIAGNOSIS — M25511 Pain in right shoulder: Secondary | ICD-10-CM

## 2017-10-19 NOTE — Therapy (Signed)
[facility] [address] [place], [place], [address] Phone: [phone]   Fax:  [phone]  Physical Therapy Treatment  Patient Details  Name: Brittany Rodgers MRN: [id number] Date of Birth: [birth year] Referring Provider: Alysia Penna, MD    Encounter Date: 10/19/2017  PT End of Session - 10/19/17 0856    Visit Number  22    Date for PT Re-Evaluation  11/15/17    Authorization Type  BCBS    Authorization Time Period  10/04/17 to 11/15/17    PT Start Time  0845    PT Stop Time  0945    PT Time Calculation (min)  60 min    Activity Tolerance  Patient tolerated treatment well;No increased pain    Behavior During Therapy  WFL for tasks assessed/performed       Past Medical History:  Diagnosis Date  . Chronic cough   . GERD (gastroesophageal reflux disease)   . Hyperlipidemia   . IBS (irritable bowel syndrome)    sees Dr. Silvano Rusk   . Routine gynecological examination    sees Dr. Freda Munro    Past Surgical History:  Procedure Laterality Date  . CESAREAN SECTION    . ROOT CANAL      There were no vitals filed for this visit.  Subjective Assessment - 10/19/17 1000    Subjective  Pt reports things are going well. She feels much better today than she did during her last session.     Patient Stated Goals  improved strength and pain free     Currently in Pain?  No/denies    Pain Onset  More than a month ago                      [facility] Adult PT Treatment/Exercise - 10/19/17 0001      Exercises   Other Exercises   quadruped threading needle Rt posterior shoulder       Shoulder Exercises: Sidelying   External Rotation  Strengthening;Right;10 reps;Limitations    External Rotation Limitations  2# dumbbell, x2 sets     ABduction  Strengthening;Right;10 reps;Theraband;Limitations    Theraband Level (Shoulder ABduction)  Level 1 (Yellow)    ABduction Limitations  x2 sets     Other Sidelying  Exercises  horizontal abduction, RUE only with 1# dumbbell 2x10 reps       Shoulder Exercises: ROM/Strengthening   UBE (Upper Arm Bike)  L4 x2 min forward/backward      Moist Heat Therapy   Number Minutes Moist Heat  15 Minutes    Moist Heat Location  Other (comment) Rt shoulder, low back       Electrical Stimulation   Electrical Stimulation Location  Rt shoulder girdle    Electrical Stimulation Action  IFC    Electrical Stimulation Parameters  15 min, to pt tolerance    Electrical Stimulation Goals  Pain      Manual Therapy   Soft tissue mobilization  IASTM Rt posterior shoulder, TPR Rt rhomboid             PT Education - 10/19/17 0855    Education provided  Yes    Education Details  Information for [place] purchase of TENS unit and over the door pulley; placement of tens pads     Person(s) Educated  Patient    Methods  Handout;Explanation    Comprehension  Verbalized understanding       PT Camero Term Goals -  10/03/17 1207      PT Campton TERM GOAL #1   Title  be independent in initial HEP    Time  4    Period  Weeks    Status  Achieved      PT Leiner TERM GOAL #2   Title  walk with dog for 15 minutes without increased LBP/thoracic pain    Time  4    Period  Weeks    Status  Achieved      PT Savich TERM GOAL #3   Title  demonstrate symmetry with ambulation due to reduced Rt hip pain    Time  4    Period  Weeks    Status  Achieved      PT Towery TERM GOAL #4   Title  report > or = to 30% reduction in thoracic/lumbar pain with work and home tasks    Time  4    Period  Weeks    Status  Achieved      PT Doty TERM GOAL #5   Title  demonstrate Rt=Lt shoulder A/ROM flexion to allow for reaching overhead with Rt UE    Baseline  Pt able to reach overhead during today's session    Time  4    Period  Weeks    Status  Achieved        PT Long Term Goals - 10/03/17 1209      PT LONG TERM GOAL #1   Title  be independent in advanced HEP    Baseline  still  learning    Time  6    Period  Weeks    Status  On-going    Target Date  11/15/17      PT LONG TERM GOAL #2   Title  reduce FOTO to < or = to 34% limitation    Time  6    Period  Weeks    Status  On-going      PT LONG TERM GOAL #3   Title  return to walking for exercise without limitation or increased pain    Baseline  walking some but not as much as she was prior, less than half a mile     Time  6    Period  Weeks    Status  On-going      PT LONG TERM GOAL #4   Title  reach overhead with Rt UE without incresaed pain    Baseline  tightness Rt posterior shoulder     Time  8    Period  Weeks    Status  On-going      PT LONG TERM GOAL #5   Title  stand for > or = to 45 minutes without limitation     Time  6    Period  Weeks    Status  On-going      PT LONG TERM GOAL #6   Title  report a 75% reduction in thoracic/lumbar pain with ADLs and self-care    Baseline  50% improved    Time  6    Period  Weeks    Status  Partially Met            Plan - 10/19/17 0856    Clinical Impression Statement  Pt continues to do well. She has been working on her HEP regularly and feels she is getting stronger. She is now able to put on her shoes/socks without difficulty compared to previously. Session focused on  progressions Rt shoulder strength and flexibility, ending with manual treatment to the posterior shoulder girdle for improved pain and muscle flexibility. Ended session with shoulder flexion ROM to atleast 150 deg and pt reported feeling good.    Rehab Potential  Good    PT Frequency  3x / week    PT Duration  8 weeks    PT Treatment/Interventions  ADLs/Self Care Home Management;Cryotherapy;Electrical Stimulation;Ultrasound;Moist Heat;Therapeutic activities;Therapeutic exercise;Functional mobility training;Neuromuscular re-education;Patient/family education;Passive range of motion;Manual techniques;Dry needling;Taping    PT Next Visit Plan  f/u on TENS/pulley order; STM Rt  posterior shoulder; progression of shoulder strength     PT Home Exercise Plan  rows, diagonals, Ws    Consulted and Agree with Plan of Care  Patient       Patient will benefit from skilled therapeutic intervention in order to improve the following deficits and impairments:  Impaired flexibility, Difficulty walking, Decreased range of motion, Decreased strength, Impaired UE functional use, Increased muscle spasms, Pain, Decreased activity tolerance  Visit Diagnosis: Pain in thoracic spine  Acute bilateral low back pain without sciatica  Acute pain of right shoulder  Pain in right hip     Problem List Patient Active Problem List   Diagnosis Date Noted  . Hypokalemia 02/13/2017  . Chronic right hip pain 11/26/2015  . Hyperlipidemia 12/23/2010  . ALLERGIC RHINITIS CAUSE UNSPECIFIED 07/24/2008  . COUGH 02/26/2008  . ACUTE BRONCHITIS 01/20/2008  . VIRAL URI 12/03/2007  . ACUTE SINUSITIS, UNSPECIFIED 10/14/2007  . GERD 10/14/2007  . IBS 10/14/2007    10:20 AM,10/19/17 Sherol Dade PT, DPT [place] [facility] [place] [facility] [address] [place], [place], [address] Phone: [phone]   Fax:  [phone]  Name: Brittany Rodgers MRN: [id number] Date of Birth: [birth year]

## 2017-10-22 ENCOUNTER — Encounter: Payer: Self-pay | Admitting: Physical Therapy

## 2017-10-22 ENCOUNTER — Ambulatory Visit: Payer: BLUE CROSS/BLUE SHIELD | Admitting: Physical Therapy

## 2017-10-22 DIAGNOSIS — M25551 Pain in right hip: Secondary | ICD-10-CM

## 2017-10-22 DIAGNOSIS — M546 Pain in thoracic spine: Secondary | ICD-10-CM

## 2017-10-22 DIAGNOSIS — M25511 Pain in right shoulder: Secondary | ICD-10-CM

## 2017-10-22 DIAGNOSIS — M545 Low back pain, unspecified: Secondary | ICD-10-CM

## 2017-10-22 NOTE — Therapy (Signed)
[facility] [address], [place], [place], [address] Phone: [phone]   Fax:  [phone]  Physical Therapy Treatment  Patient Details  Name: Brittany Rodgers MRN: [id number] Date of Birth: 02/27/1971 Referring Provider: Alysia Penna, MD    Encounter Date: 10/22/2017  PT End of Session - 10/22/17 0816    Visit Number  23    Date for PT Re-Evaluation  11/15/17    Authorization Type  BCBS    Authorization Time Period  10/04/17 to 11/15/17    PT Start Time  0806    PT Stop Time  0900    PT Time Calculation (min)  54 min    Activity Tolerance  Patient tolerated treatment well;No increased pain    Behavior During Therapy  WFL for tasks assessed/performed       Past Medical History:  Diagnosis Date  . Chronic cough   . GERD (gastroesophageal reflux disease)   . Hyperlipidemia   . IBS (irritable bowel syndrome)    sees Dr. Silvano Rusk   . Routine gynecological examination    sees Dr. Freda Munro    Past Surgical History:  Procedure Laterality Date  . CESAREAN SECTION    . ROOT CANAL      There were no vitals filed for this visit.  Subjective Assessment - 10/22/17 0808    Subjective  Pt reports that she has someone looking into the E-stim unit for her at home. She has no issues currently, and states she was not really sore after her last session despite the new exercises.     Patient Stated Goals  improved strength and pain free     Currently in Pain?  No/denies    Pain Onset  More than a month ago                      [facility] Adult PT Treatment/Exercise - 10/22/17 0001      Lumbar Exercises: Quadruped   Other Quadruped Lumbar Exercises  RUE threading the needle for posterior shoulder stretch x15 reps over foam roll       Shoulder Exercises: Supine   Flexion  Both;10 reps;Strengthening;Limitations    Flexion Limitations  maintained horizontal abduction with yellow TB resistance      Shoulder Exercises: Sidelying   External Rotation  Strengthening;Right;10 reps;Limitations    External Rotation Limitations  2# dumbbells, x2 set circuit with other sidelying therex     ABduction Limitations  x2 sets     Other Sidelying Exercises  horizontal abduction, RUE only with 2# dumbbell 2x10 reps       Shoulder Exercises: ROM/Strengthening   UBE (Upper Arm Bike)  L4 x2 min forward/x2 min backward       Electrical Stimulation   Electrical Stimulation Location  Rt shoulder girdle    Electrical Stimulation Action  IFC    Electrical Stimulation Parameters  15 min, to pt tolerance     Electrical Stimulation Goals  Pain      Manual Therapy   Soft tissue mobilization  IASTM Rt posterior shoulder, TPR Rt teres/lat region              PT Education - 10/22/17 0844    Education provided  Yes    Education Details  technique with therex     Person(s) Educated  Patient    Methods  Explanation    Comprehension  Verbalized understanding       PT Pala Term  Goals - 10/03/17 1207      PT Eno TERM GOAL #1   Title  be independent in initial HEP    Time  4    Period  Weeks    Status  Achieved      PT Balthaser TERM GOAL #2   Title  walk with dog for 15 minutes without increased LBP/thoracic pain    Time  4    Period  Weeks    Status  Achieved      PT Nater TERM GOAL #3   Title  demonstrate symmetry with ambulation due to reduced Rt hip pain    Time  4    Period  Weeks    Status  Achieved      PT Roulston TERM GOAL #4   Title  report > or = to 30% reduction in thoracic/lumbar pain with work and home tasks    Time  4    Period  Weeks    Status  Achieved      PT Lewan TERM GOAL #5   Title  demonstrate Rt=Lt shoulder A/ROM flexion to allow for reaching overhead with Rt UE    Baseline  Pt able to reach overhead during today's session    Time  4    Period  Weeks    Status  Achieved        PT Long Term Goals - 10/03/17 1209      PT LONG TERM GOAL #1   Title  be  independent in advanced HEP    Baseline  still learning    Time  6    Period  Weeks    Status  On-going    Target Date  11/15/17      PT LONG TERM GOAL #2   Title  reduce FOTO to < or = to 34% limitation    Time  6    Period  Weeks    Status  On-going      PT LONG TERM GOAL #3   Title  return to walking for exercise without limitation or increased pain    Baseline  walking some but not as much as she was prior, less than half a mile     Time  6    Period  Weeks    Status  On-going      PT LONG TERM GOAL #4   Title  reach overhead with Rt UE without incresaed pain    Baseline  tightness Rt posterior shoulder     Time  8    Period  Weeks    Status  On-going      PT LONG TERM GOAL #5   Title  stand for > or = to 45 minutes without limitation     Time  6    Period  Weeks    Status  On-going      PT LONG TERM GOAL #6   Title  report a 75% reduction in thoracic/lumbar pain with ADLs and self-care    Baseline  50% improved    Time  6    Period  Weeks    Status  Partially Met            Plan - 10/22/17 0820    Clinical Impression Statement  Pt arrived today without reports of soreness following last session. She was able to complete Rt shoulder girdle strengthening therex without difficulty, even with increase in resistance during horizontal abduction. Pt continues to do  well with her exercises and shoulder elevation ROM improved to 160 end of today's session. Noted some soreness during manual treatment, but pain free end of session following heat and estim.     Rehab Potential  Good    PT Frequency  3x / week    PT Duration  8 weeks    PT Treatment/Interventions  ADLs/Self Care Home Management;Cryotherapy;Electrical Stimulation;Ultrasound;Moist Heat;Therapeutic activities;Therapeutic exercise;Functional mobility training;Neuromuscular re-education;Patient/family education;Passive range of motion;Manual techniques;Dry needling;Taping    PT Next Visit Plan  progress  sidelying shoulder strength; standing chops/lifts; STM Rt posterior shoulder    PT Home Exercise Plan  rows, diagonals, Ws    Consulted and Agree with Plan of Care  Patient       Patient will benefit from skilled therapeutic intervention in order to improve the following deficits and impairments:  Impaired flexibility, Difficulty walking, Decreased range of motion, Decreased strength, Impaired UE functional use, Increased muscle spasms, Pain, Decreased activity tolerance  Visit Diagnosis: Pain in thoracic spine  Acute bilateral low back pain without sciatica  Acute pain of right shoulder  Pain in right hip     Problem List Patient Active Problem List   Diagnosis Date Noted  . Hypokalemia 02/13/2017  . Chronic right hip pain 11/26/2015  . Hyperlipidemia 12/23/2010  . ALLERGIC RHINITIS CAUSE UNSPECIFIED 07/24/2008  . COUGH 02/26/2008  . ACUTE BRONCHITIS 01/20/2008  . VIRAL URI 12/03/2007  . ACUTE SINUSITIS, UNSPECIFIED 10/14/2007  . GERD 10/14/2007  . IBS 10/14/2007    9:05 AM,10/22/17 Sherol Dade PT, DPT [place] [facility] [address] [place], [place], [address] Phone: [phone]   Fax:  [phone]  Name: Brittany Rodgers MRN: [id number] Date of Birth: 02-02-1971

## 2017-11-02 ENCOUNTER — Encounter: Payer: Self-pay | Admitting: Physical Therapy

## 2017-11-02 ENCOUNTER — Ambulatory Visit: Payer: BLUE CROSS/BLUE SHIELD | Attending: Family Medicine | Admitting: Physical Therapy

## 2017-11-02 DIAGNOSIS — M25511 Pain in right shoulder: Secondary | ICD-10-CM | POA: Insufficient documentation

## 2017-11-02 DIAGNOSIS — M25551 Pain in right hip: Secondary | ICD-10-CM | POA: Diagnosis present

## 2017-11-02 DIAGNOSIS — M545 Low back pain, unspecified: Secondary | ICD-10-CM

## 2017-11-02 DIAGNOSIS — M546 Pain in thoracic spine: Secondary | ICD-10-CM | POA: Diagnosis present

## 2017-11-02 NOTE — Therapy (Signed)
[facility] [address] [place], [place], [address] Phone: [phone]   Fax:  [phone]  Physical Therapy Treatment  Patient Details  Name: Brittany Rodgers MRN: [id number] Date of Birth: [birth year] Referring Provider: Alysia Penna, MD    Encounter Date: 11/02/2017  PT End of Session - 11/02/17 0956    Visit Number  24    Date for PT Re-Evaluation  11/15/17    Authorization Type  BCBS    Authorization Time Period  10/04/17 to 11/15/17    PT Start Time  0850    PT Stop Time  0945    PT Time Calculation (min)  55 min    Activity Tolerance  Patient tolerated treatment well;No increased pain    Behavior During Therapy  WFL for tasks assessed/performed       Past Medical History:  Diagnosis Date  . Chronic cough   . GERD (gastroesophageal reflux disease)   . Hyperlipidemia   . IBS (irritable bowel syndrome)    sees Dr. Silvano Rusk   . Routine gynecological examination    sees Dr. Freda Munro    Past Surgical History:  Procedure Laterality Date  . CESAREAN SECTION    . ROOT CANAL      There were no vitals filed for this visit.  Subjective Assessment - 11/02/17 0854    Subjective  Pt reports that she is a little tired today after taking a muscle relaxer last night. She is not sure what she did to aggravate her shoulder area.     Patient Stated Goals  improved strength and pain free     Currently in Pain?  Other (Comment) achey when she woke up but ok now    Pain Onset  More than a month ago            [facility] Adult PT Treatment/Exercise - 11/02/17 0001      Shoulder Exercises: Seated   Flexion  Both;Weights;15 reps;Limitations    Flexion Weight (lbs)  1    Flexion Limitations  scapular plane x15 reps, 1# dumbbells     Abduction  Both;15 reps;Weights    ABduction Weight (lbs)  1    Other Seated Exercises  overhead press x10 reps with 4# dumbbells       Shoulder Exercises: Standing   Other  Standing Exercises  Rt shoulder PNF D1/D2 flexion and extension with green TB x15 reps each     Other Standing Exercises  bent over rows with 4# dumbbells x15 reps; Rt shoulder rolling/self massage against wall       Shoulder Exercises: ROM/Strengthening   UBE (Upper Arm Bike)  L4 x3 min forward/backward  PT present to discuss progress       Moist Heat Therapy   Number Minutes Moist Heat  15 Minutes    Moist Heat Location  Shoulder Rt shoulder and low back       Electrical Stimulation   Electrical Stimulation Location  Rt shoulder girdle    Electrical Stimulation Action  IFC    Electrical Stimulation Parameters  15 min, to pt tolerance     Electrical Stimulation Goals  Pain             PT Education - 11/02/17 1007    Education provided  Yes    Education Details  encouraged pt to try bowling with realistic expectations of possible soreness    Person(s) Educated  Patient    Methods  Explanation  Comprehension  Verbalized understanding       PT [place] Term Goals - 10/03/17 1207      PT [place] TERM GOAL #1   Title  be independent in initial HEP    Time  4    Period  Weeks    Status  Achieved      PT [place] TERM GOAL #2   Title  walk with dog for 15 minutes without increased LBP/thoracic pain    Time  4    Period  Weeks    Status  Achieved      PT [place] TERM GOAL #3   Title  demonstrate symmetry with ambulation due to reduced Rt hip pain    Time  4    Period  Weeks    Status  Achieved      PT [place] TERM GOAL #4   Title  report > or = to 30% reduction in thoracic/lumbar pain with work and home tasks    Time  4    Period  Weeks    Status  Achieved      PT [place] TERM GOAL #5   Title  demonstrate Rt=Lt shoulder A/ROM flexion to allow for reaching overhead with Rt UE    Baseline  Pt able to reach overhead during today's session    Time  4    Period  Weeks    Status  Achieved        PT Long Term Goals - 10/03/17 1209      PT LONG TERM GOAL #1   Title  be  independent in advanced HEP    Baseline  still learning    Time  6    Period  Weeks    Status  On-going    Target Date  11/15/17      PT LONG TERM GOAL #2   Title  reduce FOTO to < or = to 34% limitation    Time  6    Period  Weeks    Status  On-going      PT LONG TERM GOAL #3   Title  return to walking for exercise without limitation or increased pain    Baseline  walking some but not as much as she was prior, less than half a mile     Time  6    Period  Weeks    Status  On-going      PT LONG TERM GOAL #4   Title  reach overhead with Rt UE without incresaed pain    Baseline  tightness Rt posterior shoulder     Time  8    Period  Weeks    Status  On-going      PT LONG TERM GOAL #5   Title  stand for > or = to 45 minutes without limitation     Time  6    Period  Weeks    Status  On-going      PT LONG TERM GOAL #6   Title  report a 75% reduction in thoracic/lumbar pain with ADLs and self-care    Baseline  50% improved    Time  6    Period  Weeks    Status  Partially Met            Plan - 11/02/17 1003    Clinical Impression Statement  Pt reports regular HEP adherence although she continues to have evenings with Rt shoulder irritation. Overall, she seems to be managing  this with massage techniques and ibuprofen at home. She rarely needs to take her muscle relaxers. Progressed several exercises for the UE this session, noting muscle fatigue in both UEs although no increase in pain end of session. Pt has ordered her TENS unit for home. Will continue with current POC to improve shoulder pain, strength and flexibility.     Rehab Potential  Good    PT Frequency  3x / week    PT Duration  8 weeks    PT Treatment/Interventions  ADLs/Self Care Home Management;Cryotherapy;Electrical Stimulation;Ultrasound;Moist Heat;Therapeutic activities;Therapeutic exercise;Functional mobility training;Neuromuscular re-education;Patient/family education;Passive range of motion;Manual  techniques;Dry needling;Taping    PT Next Visit Plan  progress sidelying shoulder strength; standing chops/lifts; STM Rt posterior shoulder    PT Home Exercise Plan  rows, diagonals, Ws    Consulted and Agree with Plan of Care  Patient       Patient will benefit from skilled therapeutic intervention in order to improve the following deficits and impairments:  Impaired flexibility, Difficulty walking, Decreased range of motion, Decreased strength, Impaired UE functional use, Increased muscle spasms, Pain, Decreased activity tolerance  Visit Diagnosis: Pain in thoracic spine  Acute bilateral low back pain without sciatica  Acute pain of right shoulder  Pain in right hip     Problem List Patient Active Problem List   Diagnosis Date Noted  . Hypokalemia 02/13/2017  . Chronic right hip pain 11/26/2015  . Hyperlipidemia 12/23/2010  . ALLERGIC RHINITIS CAUSE UNSPECIFIED 07/24/2008  . COUGH 02/26/2008  . ACUTE BRONCHITIS 01/20/2008  . VIRAL URI 12/03/2007  . ACUTE SINUSITIS, UNSPECIFIED 10/14/2007  . GERD 10/14/2007  . IBS 10/14/2007   10:24 AM,11/02/17 Sherol Dade PT, DPT [place] [facility] [address] [place], [place], [address] Phone: [phone]   Fax:  [phone]  Name: Brittany Rodgers MRN: [id number] Date of Birth: [birth year]

## 2017-11-05 ENCOUNTER — Ambulatory Visit: Payer: BLUE CROSS/BLUE SHIELD | Admitting: Physical Therapy

## 2017-11-05 ENCOUNTER — Encounter: Payer: Self-pay | Admitting: Physical Therapy

## 2017-11-05 DIAGNOSIS — M25511 Pain in right shoulder: Secondary | ICD-10-CM

## 2017-11-05 DIAGNOSIS — M545 Low back pain, unspecified: Secondary | ICD-10-CM

## 2017-11-05 DIAGNOSIS — M546 Pain in thoracic spine: Secondary | ICD-10-CM

## 2017-11-05 DIAGNOSIS — M25551 Pain in right hip: Secondary | ICD-10-CM

## 2017-11-05 NOTE — Therapy (Signed)
[facility] [address], [place], [place], [address] Phone: [phone]   Fax:  [phone]  Physical Therapy Treatment  Patient Details  Name: Brittany Rodgers MRN: [id number] Date of Birth: 09/06/1971 Referring Provider: Alysia Penna, MD    Encounter Date: 11/05/2017  PT End of Session - 11/05/17 1458    Visit Number  25    Date for PT Re-Evaluation  11/15/17    Authorization Type  BCBS    Authorization Time Period  10/04/17 to 11/15/17    PT Start Time  1448    PT Stop Time  1543    PT Time Calculation (min)  55 min    Activity Tolerance  Patient tolerated treatment well;No increased pain    Behavior During Therapy  WFL for tasks assessed/performed       Past Medical History:  Diagnosis Date  . Chronic cough   . GERD (gastroesophageal reflux disease)   . Hyperlipidemia   . IBS (irritable bowel syndrome)    sees Dr. Silvano Rusk   . Routine gynecological examination    sees Dr. Freda Munro    Past Surgical History:  Procedure Laterality Date  . CESAREAN SECTION    . ROOT CANAL      There were no vitals filed for this visit.  Subjective Assessment - 11/05/17 1450    Subjective  Pt reports that she was a little sore after her last session, and she tried her stretches/exercises and heat which have been helping work the soreness out.     Patient Stated Goals  improved strength and pain free     Currently in Pain?  No/denies    Pain Onset  More than a month ago         [facility] PT Assessment - 11/05/17 0001      AROM   Overall AROM Comments  Pt with increased soreness following last session's exercises, although she was able to complete techniques and exercises discussed previously in order to decrease any discomfort/pain. Today session focused on progressing shoulder elevation ROM/flexibility as well as shoulder girdle and rotator cuff strength, noting pt was able to complete without many rest breaks or  increase in pain. Pt demonstrates Rt shoulder flexion ROM this session up to 155 deg. Will continue to progress shoulder strength, flexibility and ROM to allow pt to return to daily and leisure activity.              Rt shoulder active flexion 155 deg      [facility] Adult PT Treatment/Exercise - 11/05/17 0001      Shoulder Exercises: Supine   Flexion  Both;15 reps    Flexion Limitations  maintained horizontal abduction with yellow TB resistance    Other Supine Exercises  B shoulder flexion with SPC and 3# weight x15 reps       Shoulder Exercises: Seated   Row  Both;15 reps;Theraband;Limitations    Theraband Level (Shoulder Row)  Level 4 (Blue)    Row Limitations  seated on red physioball     Other Seated Exercises  alternating serratus pulls with yellow TB x10 reps each UE (seated on red physioball)     Other Seated Exercises  bicep curls with yellow TB resistance around wrists (4# dumbbells) 2x10 reps       Shoulder Exercises: ROM/Strengthening   UBE (Upper Arm Bike)  L3 x2 min forward/ x2 min backward  PT present to discuss post session soreness    Wall  Pushups  10 reps;Limitations    Wall Pushups Limitations  2nd set with UE on red physioball x10 reps       Moist Heat Therapy   Number Minutes Moist Heat  15 Minutes    Moist Heat Location  Shoulder Rt shoulder and low back       Electrical Stimulation   Electrical Stimulation Location  Rt shoulder girdle    Electrical Stimulation Action  IFC    Electrical Stimulation Parameters  80-150 Hz, intensity to pt tolerance, x15 min     Electrical Stimulation Goals  -- muscle spasm/pain              PT Education - 11/05/17 1459    Education provided  Yes    Education Details  expecations of soreness with new exercises; technique with therex     Person(s) Educated  Patient    Methods  Explanation;Verbal cues;Demonstration    Comprehension  Verbalized understanding;Returned demonstration       PT [place] Term Goals -  10/03/17 1207      PT [place] TERM GOAL #1   Title  be independent in initial HEP    Time  4    Period  Weeks    Status  Achieved      PT [place] TERM GOAL #2   Title  walk with dog for 15 minutes without increased LBP/thoracic pain    Time  4    Period  Weeks    Status  Achieved      PT [place] TERM GOAL #3   Title  demonstrate symmetry with ambulation due to reduced Rt hip pain    Time  4    Period  Weeks    Status  Achieved      PT [place] TERM GOAL #4   Title  report > or = to 30% reduction in thoracic/lumbar pain with work and home tasks    Time  4    Period  Weeks    Status  Achieved      PT [place] TERM GOAL #5   Title  demonstrate Rt=Lt shoulder A/ROM flexion to allow for reaching overhead with Rt UE    Baseline  Pt able to reach overhead during today's session    Time  4    Period  Weeks    Status  Achieved        PT Long Term Goals - 10/03/17 1209      PT LONG TERM GOAL #1   Title  be independent in advanced HEP    Baseline  still learning    Time  6    Period  Weeks    Status  On-going    Target Date  11/15/17      PT LONG TERM GOAL #2   Title  reduce FOTO to < or = to 34% limitation    Time  6    Period  Weeks    Status  On-going      PT LONG TERM GOAL #3   Title  return to walking for exercise without limitation or increased pain    Baseline  walking some but not as much as she was prior, less than half a mile     Time  6    Period  Weeks    Status  On-going      PT LONG TERM GOAL #4   Title  reach overhead with Rt UE without incresaed pain    Baseline  tightness Rt posterior shoulder     Time  8    Period  Weeks    Status  On-going      PT LONG TERM GOAL #5   Title  stand for > or = to 45 minutes without limitation     Time  6    Period  Weeks    Status  On-going      PT LONG TERM GOAL #6   Title  report a 75% reduction in thoracic/lumbar pain with ADLs and self-care    Baseline  50% improved    Time  6    Period  Weeks    Status   Partially Met            Plan - 11/05/17 1530    Clinical Impression Statement  Pt with increased soreness following last session's exercises, although she was able to complete techniques and exercises discussed previously in order to decrease any discomfort/pain. Today session focused on progressing shoulder elevation ROM/flexibility as well as shoulder girdle and rotator cuff strength, noting pt was able to complete without many rest breaks or increase in pain. Pt demonstrates Rt shoulder flexion ROM this session up to 155 deg. Will continue to progress shoulder strength, flexibility and ROM to allow pt to return to daily and leisure activity.     Rehab Potential  Good    PT Frequency  3x / week    PT Duration  8 weeks    PT Treatment/Interventions  ADLs/Self Care Home Management;Cryotherapy;Electrical Stimulation;Ultrasound;Moist Heat;Therapeutic activities;Therapeutic exercise;Functional mobility training;Neuromuscular re-education;Patient/family education;Passive range of motion;Manual techniques;Dry needling;Taping    PT Next Visit Plan  progress upright shoulder strength; standing chops/lifts; STM Rt posterior shoulder, pt likes heat and estim end of session    PT Home Exercise Plan  rows, diagonals, Ws    Consulted and Agree with Plan of Care  Patient       Patient will benefit from skilled therapeutic intervention in order to improve the following deficits and impairments:  Impaired flexibility, Difficulty walking, Decreased range of motion, Decreased strength, Impaired UE functional use, Increased muscle spasms, Pain, Decreased activity tolerance  Visit Diagnosis: Pain in thoracic spine  Acute bilateral low back pain without sciatica  Acute pain of right shoulder  Pain in right hip     Problem List Patient Active Problem List   Diagnosis Date Noted  . Hypokalemia 02/13/2017  . Chronic right hip pain 11/26/2015  . Hyperlipidemia 12/23/2010  . ALLERGIC RHINITIS CAUSE  UNSPECIFIED 07/24/2008  . COUGH 02/26/2008  . ACUTE BRONCHITIS 01/20/2008  . VIRAL URI 12/03/2007  . ACUTE SINUSITIS, UNSPECIFIED 10/14/2007  . GERD 10/14/2007  . IBS 10/14/2007   3:31 PM,11/05/17 Sherol Dade PT, DPT [place] [facility] [address], [place], [place], [address] Phone: [phone]   Fax:  [phone]  Name: Brittany Rodgers MRN: [id number] Date of Birth: May 14, 1971

## 2017-11-09 ENCOUNTER — Ambulatory Visit: Payer: BLUE CROSS/BLUE SHIELD | Admitting: Physical Therapy

## 2017-11-09 DIAGNOSIS — M546 Pain in thoracic spine: Secondary | ICD-10-CM | POA: Diagnosis not present

## 2017-11-09 DIAGNOSIS — M545 Low back pain, unspecified: Secondary | ICD-10-CM

## 2017-11-09 DIAGNOSIS — M25511 Pain in right shoulder: Secondary | ICD-10-CM

## 2017-11-09 DIAGNOSIS — M25551 Pain in right hip: Secondary | ICD-10-CM

## 2017-11-09 NOTE — Therapy (Signed)
[facility] [address] [place], [place], [address] Phone: [phone]   Fax:  [phone]  Physical Therapy Treatment/Discharge  Patient Details  Name: Brittany Rodgers MRN: [id number] Date of Birth: [birth year] Referring Provider: Alysia Penna, MD   Encounter Date: 11/09/2017  PT End of Session - 11/09/17 0853    Visit Number  26    Date for PT Re-Evaluation  11/15/17    Authorization Type  BCBS    Authorization Time Period  10/04/17 to 11/15/17    PT Start Time  0805    PT Stop Time  0858    PT Time Calculation (min)  53 min    Activity Tolerance  Patient tolerated treatment well;No increased pain    Behavior During Therapy  WFL for tasks assessed/performed       Past Medical History:  Diagnosis Date  . Chronic cough   . GERD (gastroesophageal reflux disease)   . Hyperlipidemia   . IBS (irritable bowel syndrome)    sees Dr. Silvano Rusk   . Routine gynecological examination    sees Dr. Freda Munro    Past Surgical History:  Procedure Laterality Date  . CESAREAN SECTION    . ROOT CANAL      There were no vitals filed for this visit.  Subjective Assessment - 11/09/17 0807    Subjective  Pt reports that she is doing well. She started using her pulley at home, and she really likes it. She has no complaints right now. Her back and shoulder are much better, atleast 75% improved which is not exactly where she wants to be, but she knows this will improve moving forward.      Patient Stated Goals  improved strength and pain free     Currently in Pain?  No/denies    Pain Onset  More than a month ago         [facility] PT Assessment - 11/09/17 0001      Assessment   Medical Diagnosis  thoracic myofascial strain, strain of lumbar region, strain of Rt shoulder    Referring Provider  Alysia Penna, MD    Onset Date/Surgical Date  07/15/17    Hand Dominance  Right    Next MD Visit  none    Prior Therapy  none       Precautions   Precautions  None      Restrictions   Weight Bearing Restrictions  No      Balance Screen   Has the patient fallen in the past 6 months  No    Has the patient had a decrease in activity level because of a fear of falling?   No    Is the patient reluctant to leave their home because of a fear of falling?   No      Prior Function   Level of Independence  Independent    Vocation  Full time employment    Vocation Requirements  phone operator-desk work    Leisure  gardending, walking, sewing      Cognition   Overall Cognitive Status  Within Functional Limits for tasks assessed      Observation/Other Assessments   Focus on Therapeutic Outcomes (FOTO)   30% limited      Posture/Postural Control   Posture/Postural Control  No significant limitations      AROM   Overall AROM   Deficits    Overall AROM Comments  Rt shoulder flexion 155 deg  Strength   Overall Strength Comments  Lt shoulder 5/5 MMT       Palpation   Palpation comment  minimal with palpation along Rt teres/periscapular region       Transfers   Transfers  Independent with all Transfers                  [facility] Adult PT Treatment/Exercise - 11/09/17 0001      Shoulder Exercises: Seated   Flexion  Both;15 reps;Weights    Flexion Weight (lbs)  4    Flexion Limitations  BUE with weighted bar       Shoulder Exercises: Standing   Other Standing Exercises  Rt UE ranger L30 flexion and abduction x20 reps     Other Standing Exercises  Standing on foam pad x10 reps with blue TB       Shoulder Exercises: ROM/Strengthening   UBE (Upper Arm Bike)  L4 x2 min forward/backward       Shoulder Exercises: Stretch   Other Shoulder Stretches  Rt shoulder threading the needle x15 reps        Moist Heat Therapy   Number Minutes Moist Heat  12 Minutes    Moist Heat Location  Shoulder Rt       Electrical Stimulation   Electrical Stimulation Location  Rt shoulder girdle    Electrical Stimulation  Action  IFC    Electrical Stimulation Parameters  80-150 Hz, intensity to pt tolerance, x12 min with moist heat     Electrical Stimulation Goals  -- muscle spasm              PT Education - 11/09/17 [phone]    Education provided  Yes    Education Details  goals met, importance of continuing with exercises/walking program progression moving forward to encourage more activity tolerance    Person(s) Educated  Patient    Methods  Explanation;Handout    Comprehension  Verbalized understanding       PT [place] Term Goals - 11/09/17 0827      PT Melder TERM GOAL #1   Title  be independent in initial HEP    Time  4    Period  Weeks    Status  Achieved      PT M[place]ery TERM GOAL #2   Title  walk with dog for 15 minutes without increased LBP/thoracic pain    Time  4    Period  Weeks    Status  Achieved      PT [place] TERM GOAL #3   Title  demonstrate symmetry with ambulation due to reduced Rt hip pain    Time  4    Period  Weeks    Status  Achieved      PT [place] TERM GOAL #4   Title  report > or = to 30% reduction in thoracic/lumbar pain with work and home tasks    Time  4    Period  Weeks    Status  Achieved      PT [place] TERM GOAL #5   Title  demonstrate Rt=Lt shoulder A/ROM flexion to allow for reaching overhead with Rt UE    Baseline  Pt able to reach overhead during today's session    Time  4    Period  Weeks    Status  Achieved        PT Long Term Goals - 11/09/17 0827      PT LONG TERM GOAL #1   Title  be independent in advanced HEP    Baseline  still learning    Time  6    Period  Weeks    Status  Achieved      PT LONG TERM GOAL #2   Title  reduce FOTO to < or = to 34% limitation    Baseline  30% limited     Time  6    Period  Weeks    Status  Achieved      PT LONG TERM GOAL #3   Title  return to walking for exercise without limitation or increased pain    Baseline  walking some but not as much as she was prior, less than half a mile     Time  6     Period  Weeks    Status  Partially Met      PT LONG TERM GOAL #4   Title  reach overhead with Rt UE without incresaed pain    Baseline  tightness Rt posterior shoulder but ROM is full     Time  8    Period  Weeks    Status  Achieved      PT LONG TERM GOAL #5   Title  stand for > or = to 45 minutes without limitation     Time  6    Period  Weeks    Status  Achieved      PT LONG TERM GOAL #6   Title  report a 75% reduction in thoracic/lumbar pain with ADLs and self-care    Baseline  75% improved     Time  6    Period  Weeks    Status  Achieved            Plan - 11/09/17 3646    Clinical Impression Statement  Pt was discharged this visit having met all but one of her Luper and long term goals since beginning PT. She feels atleast 75% improved overall and primarily notes posterior shoulder tightness rather than pain with overhead and repetitive activity. Her FOTO score improved from 60% limitation at evaluation to 30% limitation at today's date. She demonstrates 5/5 Rt shoulder strength as well as Rt shoulder active flexion to atleast 155 deg without difficulty. She has been consistently completing her HEP and demonstrates good understanding and set up of these exercises. Pt was agreeable with d/c at this time and feels she is comfortable continuing with her HEP on her own.    Rehab Potential  Good    PT Frequency  3x / week    PT Duration  8 weeks    PT Treatment/Interventions  ADLs/Self Care Home Management;Cryotherapy;Electrical Stimulation;Ultrasound;Moist Heat;Therapeutic activities;Therapeutic exercise;Functional mobility training;Neuromuscular re-education;Patient/family education;Passive range of motion;Manual techniques;Dry needling;Taping    PT Next Visit Plan  d/c home with HEP     PT Home Exercise Plan  rows, diagonals, Ws    Consulted and Agree with Plan of Care  Patient       Patient will benefit from skilled therapeutic intervention in order to improve the  following deficits and impairments:  Impaired flexibility, Difficulty walking, Decreased range of motion, Decreased strength, Impaired UE functional use, Increased muscle spasms, Pain, Decreased activity tolerance  Visit Diagnosis: Pain in thoracic spine  Acute bilateral low back pain without sciatica  Acute pain of right shoulder  Pain in right hip  PHYSICAL THERAPY DISCHARGE SUMMARY  Visits from Start of Care: 26  Current functional level related to goals / functional  outcomes: See above for more details    Remaining deficits: See above for more details    Education / Equipment: See above for more details  Plan: Patient agrees to discharge.  Patient goals were met. Patient is being discharged due to being pleased with the current functional level.  ?????        Problem List Patient Active Problem List   Diagnosis Date Noted  . Hypokalemia 02/13/2017  . Chronic right hip pain 11/26/2015  . Hyperlipidemia 12/23/2010  . ALLERGIC RHINITIS CAUSE UNSPECIFIED 07/24/2008  . COUGH 02/26/2008  . ACUTE BRONCHITIS 01/20/2008  . VIRAL URI 12/03/2007  . ACUTE SINUSITIS, UNSPECIFIED 10/14/2007  . GERD 10/14/2007  . IBS 10/14/2007    9:04 AM,11/09/17 Sherol Dade PT, DPT [facility] [address], [place], [place], [address] Phone: [phone]   Fax:  [phone]  Name: Brittany Rodgers MRN: [id number] Date of Birth: [birth year]

## 2017-11-09 NOTE — Patient Instructions (Addendum)
   ELASTIC BAND SHOULDER DIAGONAL  EXT - ADD  While holding an elastic band up high and out to your side, pull the band downward toward your opposite thigh.  Your hand should start in the thumb-up position and end in the thumb-back position.  x15-20 reps       ELASTIC BAND DIAGONAL   FLEX - ABD  While holding an elastic band across the lower half of your body, pull the band upwards and outwards your your opposite side. Your hand should start in the thumb-back position and end in the thumb-up position.  15-20 reps       HIP FLEXOR STRETCH 2  While lying on a table or high bed, let the affected leg lower towards the floor until a stretch is felt along the front of your thigh.   At the same time, grasp your opposite knee and pull it towards your chest. Hold 30 sec, repeat 2-3 times each side.      [facility] [address] [place], [place] [address] Phone # [phone] Fax [phone]

## 2017-11-29 ENCOUNTER — Other Ambulatory Visit: Payer: Self-pay | Admitting: Family Medicine

## 2017-11-30 NOTE — Telephone Encounter (Signed)
Last OV 08/28/2017  Last refilled 11/17/2016 disp 90 with 3 refills   Sent to PCP for approval

## 2018-02-04 ENCOUNTER — Other Ambulatory Visit: Payer: Self-pay | Admitting: Family Medicine

## 2018-05-02 ENCOUNTER — Telehealth: Payer: Self-pay | Admitting: Family Medicine

## 2018-05-02 DIAGNOSIS — M546 Pain in thoracic spine: Secondary | ICD-10-CM

## 2018-05-02 NOTE — Telephone Encounter (Signed)
Pt dropped off Brittany Rodgers - [facility]) Medical Form to be completed by the provider.  Upon completion pt would like to have the forms faxed to [phone] or [employer] for [place].  Form was put in providers folder.

## 2018-05-02 NOTE — Telephone Encounter (Signed)
Copied from [place] [id number]. Topic: General - Other >> May 02, 2018  2:59 PM Synthia Innocent wrote: Reason for CRM: FYI patient states [facility] (fax# [phone] or [phone]) will be faxing form over, just wanted to make office aware and be on the look out

## 2018-05-03 DIAGNOSIS — M546 Pain in thoracic spine: Secondary | ICD-10-CM | POA: Insufficient documentation

## 2018-05-03 NOTE — Telephone Encounter (Signed)
The form is ready  

## 2018-05-03 NOTE — Telephone Encounter (Signed)
Form placed in Dr. Fry's RED folder  

## 2018-05-03 NOTE — Telephone Encounter (Signed)
Noted  

## 2018-05-07 NOTE — Telephone Encounter (Signed)
Form has been faxed and placed to be scanned into pt's chart. Called pt and left a VM that form are done and have been faxed as requested. Advised for pt to give [name] a call back to see if they need to pick a copy up for them self.   Copy placed to be scanned into pt's chart.   CRM created and sent to [facility] pool

## 2018-05-30 ENCOUNTER — Ambulatory Visit: Payer: Self-pay | Admitting: Family Medicine

## 2018-05-30 NOTE — Telephone Encounter (Signed)
Pt called with C/O Left chest tightness behind left breast.  She denied radiating pain, sweatting, SOB.  Pt states the pain started a couple of days after Oral surgery July 25  on the left side. Pt has been working and states she is stressed.  Pt risk factors are HTN and high cholesterol.  Pt denies fever but states there is an area within her mouth that is red and sore.  Care instructions given. Pt verbalized understanding of all instructions.  Reason for Disposition . [1] Chest pain lasting <= 5 minutes AND [2] NO chest pain or cardiac symptoms now(Exceptions: pains lasting a few seconds)  Answer Assessment - Initial Assessment Questions 1. LOCATION: "Where does it hurt?"       Pain behind left breast 2. RADIATION: "Does the pain go anywhere else?" (e.g., into neck, jaw, arms, back)     Has soreness in lt mouth from oral surgery 3. ONSET: "When did the chest pain begin?" (Minutes, hours or days)      Started just after oral surgery July 25th 4. PATTERN "Does the pain come and go, or has it been constant since it started?"  "Does it get worse with exertion?"      Does not know  5. DURATION: "How long does it last" (e.g., seconds, minutes, hours)     seconds 6. SEVERITY: "How bad is the pain?"  (e.g., Scale 1-10; mild, moderate, or severe)    - MILD (1-3): doesn't interfere with normal activities     - MODERATE (4-7): interferes with normal activities or awakens from sleep    - SEVERE (8-10): excruciating pain, unable to do any normal activities       moderate 7. CARDIAC RISK FACTORS: "Do you have any history of heart problems or risk factors for heart disease?" (e.g., prior heart attack, angina; high blood pressure, diabetes, being overweight, high cholesterol, smoking, or strong family history of heart disease)     Htn high cholesterol   8. PULMONARY RISK FACTORS: "Do you have any history of lung disease?"  (e.g., blood clots in lung, asthma, emphysema, birth control pills)     no 9.  CAUSE: "What do you think is causing the chest pain?"     Stress ??? HTN 10. OTHER SYMPTOMS: "Do you have any other symptoms?" (e.g., dizziness, nausea, vomiting, sweating, fever, difficulty breathing, cough)       Lightheaded  Protocols used: CHEST PAIN-A-AH

## 2018-05-31 ENCOUNTER — Ambulatory Visit (INDEPENDENT_AMBULATORY_CARE_PROVIDER_SITE_OTHER): Payer: BLUE CROSS/BLUE SHIELD | Admitting: Family Medicine

## 2018-05-31 ENCOUNTER — Encounter: Payer: Self-pay | Admitting: Family Medicine

## 2018-05-31 VITALS — BP 120/86 | HR 75 | Temp 98.5°F | Ht 62.0 in | Wt 155.4 lb

## 2018-05-31 DIAGNOSIS — N3281 Overactive bladder: Secondary | ICD-10-CM | POA: Diagnosis not present

## 2018-05-31 DIAGNOSIS — R350 Frequency of micturition: Secondary | ICD-10-CM | POA: Diagnosis not present

## 2018-05-31 DIAGNOSIS — R079 Chest pain, unspecified: Secondary | ICD-10-CM

## 2018-05-31 DIAGNOSIS — K219 Gastro-esophageal reflux disease without esophagitis: Secondary | ICD-10-CM | POA: Diagnosis not present

## 2018-05-31 LAB — POCT URINALYSIS DIPSTICK
Bilirubin, UA: NEGATIVE
Blood, UA: 3200
Glucose, UA: NEGATIVE
KETONES UA: NEGATIVE
LEUKOCYTES UA: NEGATIVE
NITRITE UA: NEGATIVE
PROTEIN UA: POSITIVE — AB
Spec Grav, UA: 1.02 (ref 1.010–1.025)
Urobilinogen, UA: 0.2 E.U./dL
pH, UA: 7 (ref 5.0–8.0)

## 2018-05-31 MED ORDER — SOLIFENACIN SUCCINATE 5 MG PO TABS: 5.0000 mg | ORAL_TABLET | Freq: Every day | ORAL | 5 refills | Status: DC

## 2018-05-31 MED ORDER — OMEPRAZOLE-SODIUM BICARBONATE 20-1100 MG PO CAPS: 1.0000 | ORAL_CAPSULE | Freq: Every day | ORAL | 3 refills | Status: AC

## 2018-05-31 MED ORDER — ATORVASTATIN CALCIUM 20 MG PO TABS: 20.0000 mg | ORAL_TABLET | Freq: Every day | ORAL | 3 refills | Status: DC

## 2018-05-31 NOTE — Progress Notes (Signed)
   Subjective:    Patient ID: Brittany Rodgers, female    DOB: November 11, 1970, 47 y.o.   MRN: [id number]  HPI Here for several issues. First she has had urgency and frequency of urinations for several months ,and this is particularly worrisome at night. No burning or discomfort. She admits to being under a lot of stress from her job. She has limited her fluid intake after supper. She uses no caffeine. Then one month ago she decided to stop all her medications to see if they might be irritating her bladder. This did not help with the urinations at all, but she did start to experience an intermittent tightness in her chest. This comes and goes, and it lasts only 5-10 seconds at a time. No real chest pain, no SOB. Of note she had been taking Zegerid for GERD prior to this. She notes she had a normal GYN exam and mammogram in June.    Review of Systems  Constitutional: Negative.   Respiratory: Negative.   Cardiovascular: Positive for chest pain. Negative for palpitations and leg swelling.  Gastrointestinal: Negative.   Genitourinary: Positive for frequency and urgency. Negative for dysuria.  Neurological: Negative.        Objective:   Physical Exam  Constitutional: She is oriented to person, place, and time. She appears well-developed and well-nourished.  Neck: No thyromegaly present.  Cardiovascular: Normal rate, regular rhythm, normal heart sounds and intact distal pulses.  Pulmonary/Chest: Effort normal and breath sounds normal. No stridor. No respiratory distress. She has no wheezes. She has no rales. She exhibits no tenderness.  Abdominal: Soft. Bowel sounds are normal. She exhibits no distension and no mass. There is no tenderness. There is no rebound and no guarding. No hernia.  Lymphadenopathy:    She has no cervical adenopathy.  Neurological: She is alert and oriented to person, place, and time.          Assessment & Plan:  She has an overactive bladder, and I reassured her that her  medications were not contributing to this. She will try Vesicare 5 mg daily. The chest tightness she describes is likely the result of esophageal spasms caused by GERD. She will get back on Zegerid every day, and I think these symptoms will resolve. Recheck prn.  Alysia Penna, MD

## 2018-09-02 ENCOUNTER — Other Ambulatory Visit: Payer: Self-pay | Admitting: Family Medicine

## 2018-10-23 ENCOUNTER — Other Ambulatory Visit: Payer: Self-pay | Admitting: Obstetrics and Gynecology

## 2018-10-23 DIAGNOSIS — N6310 Unspecified lump in the right breast, unspecified quadrant: Secondary | ICD-10-CM

## 2018-10-28 ENCOUNTER — Other Ambulatory Visit: Payer: Self-pay | Admitting: Obstetrics and Gynecology

## 2018-10-28 ENCOUNTER — Ambulatory Visit
Admission: RE | Admit: 2018-10-28 | Discharge: 2018-10-28 | Disposition: A | Discharge status: Home / Self Care | Payer: BLUE CROSS/BLUE SHIELD | Source: Ambulatory Visit | Attending: Obstetrics and Gynecology | Admitting: Obstetrics and Gynecology

## 2018-10-28 DIAGNOSIS — N6310 Unspecified lump in the right breast, unspecified quadrant: Secondary | ICD-10-CM

## 2018-10-28 DIAGNOSIS — R599 Enlarged lymph nodes, unspecified: Secondary | ICD-10-CM

## 2018-10-29 ENCOUNTER — Ambulatory Visit
Admission: RE | Admit: 2018-10-29 | Discharge: 2018-10-29 | Disposition: A | Discharge status: Home / Self Care | Payer: BLUE CROSS/BLUE SHIELD | Source: Ambulatory Visit | Attending: Obstetrics and Gynecology | Admitting: Obstetrics and Gynecology

## 2018-10-29 DIAGNOSIS — R599 Enlarged lymph nodes, unspecified: Secondary | ICD-10-CM

## 2018-10-29 DIAGNOSIS — N6310 Unspecified lump in the right breast, unspecified quadrant: Secondary | ICD-10-CM

## 2018-10-31 ENCOUNTER — Encounter: Payer: Self-pay | Admitting: *Deleted

## 2018-11-01 ENCOUNTER — Encounter: Payer: Self-pay | Admitting: Family Medicine

## 2018-11-01 ENCOUNTER — Ambulatory Visit (INDEPENDENT_AMBULATORY_CARE_PROVIDER_SITE_OTHER): Payer: BLUE CROSS/BLUE SHIELD | Admitting: Family Medicine

## 2018-11-01 VITALS — BP 140/88 | HR 86 | Temp 98.8°F | Wt 160.4 lb

## 2018-11-01 DIAGNOSIS — M546 Pain in thoracic spine: Secondary | ICD-10-CM

## 2018-11-01 DIAGNOSIS — C50912 Malignant neoplasm of unspecified site of left female breast: Secondary | ICD-10-CM

## 2018-11-01 DIAGNOSIS — J069 Acute upper respiratory infection, unspecified: Secondary | ICD-10-CM

## 2018-11-01 NOTE — Progress Notes (Signed)
   Subjective:    Patient ID: Brittany Rodgers, female    DOB: 09-16-1971, 48 y.o.   MRN: [id number]  HPI Here for several issues. First for the past 4 days she has had a dry cough, itchy eyes, ST, and a headache. No fever. She is taking Zyrtec and Tylenol. Second she has questions about breast cancer. 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 She is scheduled to meet with Oncology next week. She is still a bit overwhelmed by the news. Third she has been dealing with thoracic back pain and some pain that radiates to the rigiht arm for several years. This has made it difficult for her to do her job (she stocks shelves with clothing) and she is considering filing for disability. She has had PT for this, and she has seen Dr. Verlin Dike and someone at the [facility] about this pain. She plans to retain an attorney as well.    Review of Systems  Constitutional: Negative.   HENT: Positive for congestion, postnasal drip and sore throat. Negative for sinus pressure and sinus pain.   Eyes: Positive for itching.  Respiratory: Positive for cough.   Gastrointestinal: Negative.   Musculoskeletal: Positive for back pain.       Objective:   Physical Exam Constitutional:      Appearance: Normal appearance.  HENT:     Right Ear: Tympanic membrane and ear canal normal.     Left Ear: Tympanic membrane and ear canal normal.     Nose: Nose normal.     Mouth/Throat:     Pharynx: Oropharynx is clear.  Eyes:     Conjunctiva/sclera: Conjunctivae normal.  Pulmonary:     Effort: Pulmonary effort is normal. No respiratory distress.     Breath sounds: Normal breath sounds. No stridor. No wheezing, rhonchi or rales.  Musculoskeletal:     Comments: She is tender over the upper thoracic spine and right upper back. ROM is full   Lymphadenopathy:     Cervical: No cervical adenopathy.  Neurological:     Mental Status: She is alert.             Assessment & Plan:  First she has a viral URI. She can drink fluids and rest. Try Delsym for the cough. She has recently diagnosed breast cancer , and we spoke about how she can get help to go through this. She will see Oncology as we mentioned. As far as rating her for disability, I need more information. I asked her to have her records from Dr. Othelia Pulling and from the [facility] be forwarded to me to review. Once I can look at these, we will talk about potential disability.  Alysia Penna, MD

## 2018-11-04 ENCOUNTER — Other Ambulatory Visit: Payer: Self-pay | Admitting: *Deleted

## 2018-11-04 DIAGNOSIS — C50511 Malignant neoplasm of lower-outer quadrant of right female breast: Secondary | ICD-10-CM

## 2018-11-04 DIAGNOSIS — Z171 Estrogen receptor negative status [ER-]: Principal | ICD-10-CM

## 2018-11-04 DIAGNOSIS — C50919 Malignant neoplasm of unspecified site of unspecified female breast: Secondary | ICD-10-CM | POA: Insufficient documentation

## 2018-11-05 NOTE — Progress Notes (Signed)
[facility]  [phone] [phone]    ID: Brittany Rodgers DOB: 08/26/1971  MR#: [id number]  OEU#:[id number]  Patient Care Team: Brittany Morale, MD as PCP - Brittany Draft, MD as Consulting Physician (General Surgery) Brittany Rodgers, Brittany Dad, MD as Consulting Physician (Oncology) Brittany Gibson, MD as Attending Physician (Radiation Oncology) Brittany Mayer, MD as Consulting Physician (Gastroenterology) Brittany Millers, MD as Consulting Physician (Obstetrics and Gynecology) OTHER MD: Dr. Siri Rodgers (urology)    CHIEF COMPLAINT: Triple negative breast cancer  CURRENT TREATMENT: Neoadjuvant chemotherapy   HISTORY OF CURRENT ILLNESS: Brittany Rodgers presented with a palpable right breast lump and associated tenderness for approximately 1 week. She called her doctor immediately following  She underwent unilateral right diagnostic mammography with tomography and right breast ultrasonography at [facility] on 10/28/2018 showing: Breast Density Category C. Radiopaque BB was placed at the site of the patient's palpable lump in the central right breast. An irregular hyperdense mass with associated increased trabecular thickening is seen in the deep central right breast. On physical exam, a firm, fixed mass in the central right breast is palpated. Target ultrasound is performed, showing and irregular hypoechoic mass at the 8 o'clock position 3 cm from the nipple. It measures 3.6 x 2.9 x 2.3 cm. There is associated peripheral vascularity. Evaluation of the right axilla demonstrates a single prominent lymph node demonstrating 0.4 cm cortical thickening. The remaining lymph nodes ave normal appearance with a thin, symmetric cortex.   Accordingly on 10/29/2018 she proceeded to biopsy of the right breast area in question. The pathology from this procedure showed ([id number]): invasive ductal carcinoma, grade III. Prognostic indicators significant for: estrogen  receptor, 0% negative and progesterone receptor, 0% negative. Proliferation marker Ki67 at 80%. HER2 negative (1+) by immunohistochemistry.  Biopsy of the right axilla was performed on the same day. The pathology from this procedure showed ([id number]): lymph node, negative for carcinoma.  This was felt to be concordant  The patient's subsequent history is as detailed below.   INTERVAL HISTORY: Brittany Rodgers was evaluated in the multidisciplinary breast cancer clinic on 11/06/2018 accompanied by her husband, Brittany Rodgers.   Her case was also presented at the multidisciplinary breast cancer conference on the same day. At that time a preliminary plan was proposed: Neoadjuvant chemotherapy, by definitive surgery and adjuvant radiation; genetics testing.    REVIEW OF SYSTEMS: Brittany Rodgers denies unusual headaches, visual changes, nausea, vomiting, stiff neck, dizziness, or gait imbalance. There has been no cough, phlegm production, or pleurisy, no chest pain or pressure, and no change in bowel or bladder habits. The patient denies fever, rash, bleeding, unexplained fatigue or unexplained weight loss. A detailed review of systems was otherwise entirely negative.   PAST MEDICAL HISTORY: Past Medical History:  Diagnosis Date  . Chronic cough   . GERD (gastroesophageal reflux disease)   . Hyperlipidemia   . IBS (irritable bowel syndrome)    sees Dr. Silvano Rodgers   . Routine gynecological examination    sees Dr. Freda Rodgers     PAST SURGICAL HISTORY: Past Surgical History:  Procedure Laterality Date  . CESAREAN SECTION    . ROOT CANAL       FAMILY HISTORY: Family History  Problem Relation Age of Onset  . Hypertension Other   . Breast cancer Other   . Breast cancer Maternal Aunt    Brittany Rodgers's father died from an unknown cause at age 36. [name] died from unknown health complications at age 63. The patient  has 4 sisters. Brittany Rodgers has 3 maternal aunts that have been diagnosed with breast  cancer. Patient denies anyone in her family having ovarian, prostate, or pancreatic cancer.    GYNECOLOGIC HISTORY:  Patient's last menstrual period was 10/08/2018. 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 Her husband, Brittany Rodgers, is a Administrator. The patient has two children, Brittany Rodgers and Brittany Rodgers, from another relationship. Brittany Rodgers is 41, lives in [place]. Brittany Rodgers is 68, lives in [place], and is in [employer].  The patient's husband Brittany Rodgers has a child, Brittany Rodgers, from another relationship; she lives in [place] and is in school. The patient has 5 grandchildren. She attends the [employer].    ADVANCED DIRECTIVES: Brittany Rodgers's husband, Brittany Rodgers, is automatically her healthcare power of attorney.     HEALTH MAINTENANCE: Social History   Tobacco Use  . Smoking status: Never Smoker  . Smokeless tobacco: Never Used  Substance Use Topics  . Alcohol use: No    Alcohol/week: 0.0 standard drinks  . Drug use: No    Colonoscopy: yes  PAP: 02/2018  Bone density: no   Allergies  Allergen Reactions  . Barbiturates Other (See Comments)    Keeps pt awake  . Sulfonamide Derivatives Itching    Current Outpatient Medications  Medication Sig Dispense Refill  . atorvastatin (LIPITOR) 20 MG tablet Take 1 tablet (20 mg total) by mouth daily. 90 tablet 3  . cyclobenzaprine (FLEXERIL) 10 MG tablet TAKE 1 TABLET BY MOUTH THREE TIMES DAILY AS NEEDED FOR MUSCLE SPASMS 90 tablet 0  . Omeprazole-Sodium Bicarbonate (ZEGERID) 20-1100 MG CAPS capsule Take 1 capsule by mouth daily before breakfast. 90 each 3  . polyethylene glycol (MIRALAX / GLYCOLAX) packet Take 17 g by mouth as needed. (Patient taking differently: Take 17 g by mouth daily as needed (constipation). Mix in 8 oz liquid and drink) 100 each 5  . potassium chloride (K-DUR) 10 MEQ  tablet TAKE 1 TABLET BY MOUTH ONCE DAILY 90 tablet 3   No current facility-administered medications for this visit.      OBJECTIVE: Young African-American woman in no acute distress  Vitals:   11/06/18 0838  BP: (!) 170/95  Pulse: 85  Resp: 18  Temp: 99.1 F (37.3 C)  SpO2: 100%     Body mass index is 28.88 kg/m.   Wt Readings from Last 3 Encounters:  11/06/18 157 lb 14.4 oz (71.6 kg)  11/01/18 160 lb 6 oz (72.7 kg)  05/31/18 155 lb 6.4 oz (70.5 kg)      ECOG FS:0 - Asymptomatic  Ocular: Sclerae unicteric, pupils round and equal Ear-nose-throat: Oropharynx clear and moist Lymphatic: No cervical or supraclavicular adenopathy Lungs no rales or rhonchi Heart regular rate and rhythm Abd soft, nontender, positive bowel sounds MSK no focal spinal tenderness, no joint edema Neuro: non-focal, well-oriented, appropriate affect Breasts: The right breast is status post recent biopsy.  There is an easily palpable mass in the inferior slightly outer aspect of the right breast.  There is no skin involvement or obvious nipple retraction.  The left breast is benign.  Both axillae are benign.   LAB RESULTS:  CMP     Component Value Date/Time   NA 140 11/06/2018 0821   K 4.1 11/06/2018 0821   CL 103 11/06/2018 0821   CO2 28 11/06/2018 0821   GLUCOSE  93 11/06/2018 0821   BUN 11 11/06/2018 0821   CREATININE 0.86 11/06/2018 0821   CALCIUM 9.3 11/06/2018 0821   PROT 8.0 11/06/2018 0821   ALBUMIN 4.4 11/06/2018 0821   AST 11 (L) 11/06/2018 0821   ALT 25 11/06/2018 0821   ALKPHOS 100 11/06/2018 0821   BILITOT 0.5 11/06/2018 0821   GFRNONAA >60 11/06/2018 0821   GFRAA >60 11/06/2018 0821    No results found for: TOTALPROTELP, ALBUMINELP, A1GS, A2GS, BETS, BETA2SER, GAMS, MSPIKE, SPEI  No results found for: Nils Pyle, [facility]  Lab Results  Component Value Date   WBC 4.3 11/06/2018   NEUTROABS 2.4 11/06/2018   HGB 13.4 11/06/2018   HCT 41.5 11/06/2018    MCV 88.7 11/06/2018   PLT 392 11/06/2018    _0 @  No results found for: LABCA2  No components found for: [id number]  No results for input(s): INR in the last 168 hours.  No results found for: LABCA2  No results found for: WUJ811  No results found for: BJY782  No results found for: NFA213  No results found for: CA2729  No components found for: HGQUANT  No results found for: CEA1 / No results found for: CEA1   No results found for: AFPTUMOR  No results found for: CHROMOGRNA  No results found for: PSA1  Appointment on 11/06/2018  Component Date Value Ref Range Status  . Sodium 11/06/2018 140  135 - 145 mmol/L Final  . Potassium 11/06/2018 4.1  3.5 - 5.1 mmol/L Final  . Chloride 11/06/2018 103  98 - 111 mmol/L Final  . CO2 11/06/2018 28  22 - 32 mmol/L Final  . Glucose, Bld 11/06/2018 93  70 - 99 mg/dL Final  . BUN 11/06/2018 11  6 - 20 mg/dL Final  . Creatinine 11/06/2018 0.86  0.44 - 1.00 mg/dL Final  . Calcium 11/06/2018 9.3  8.9 - 10.3 mg/dL Final  . Total Protein 11/06/2018 8.0  6.5 - 8.1 g/dL Final  . Albumin 11/06/2018 4.4  3.5 - 5.0 g/dL Final  . AST 11/06/2018 11* 15 - 41 U/L Final  . ALT 11/06/2018 25  0 - 44 U/L Final  . Alkaline Phosphatase 11/06/2018 100  38 - 126 U/L Final  . Total Bilirubin 11/06/2018 0.5  0.3 - 1.2 mg/dL Final  . GFR, Est Non Af Am 11/06/2018 >60  >60 mL/min Final  . GFR, Est AFR Am 11/06/2018 >60  >60 mL/min Final  . Anion gap 11/06/2018 9  5 - 15 Final   Performed at [facility], [place] [address], [place], [place] [address]  . WBC Count 11/06/2018 4.3  4.0 - 10.5 K/uL Final  . RBC 11/06/2018 4.68  3.87 - 5.11 MIL/uL Final  . Hemoglobin 11/06/2018 13.4  12.0 - 15.0 g/dL Final  . HCT 11/06/2018 41.5  36.0 - 46.0 % Final  . MCV 11/06/2018 88.7  80.0 - 100.0 fL Final  . MCH 11/06/2018 28.6  26.0 - 34.0 pg Final  . MCHC 11/06/2018 32.3  30.0 - 36.0 g/dL Final  . RDW 11/06/2018 12.4  11.5 - 15.5 %  Final  . Platelet Count 11/06/2018 392  150 - 400 K/uL Final  . nRBC 11/06/2018 0.0  0.0 - 0.2 % Final  . Neutrophils Relative % 11/06/2018 57  % Final  . Neutro Abs 11/06/2018 2.4  1.7 - 7.7 K/uL Final  . Lymphocytes Relative 11/06/2018 34  % Final  . Lymphs Abs 11/06/2018 1.4  0.7 - 4.0  K/uL Final  . Monocytes Relative 11/06/2018 8  % Final  . Monocytes Absolute 11/06/2018 0.4  0.1 - 1.0 K/uL Final  . Eosinophils Relative 11/06/2018 1  % Final  . Eosinophils Absolute 11/06/2018 0.1  0.0 - 0.5 K/uL Final  . Basophils Relative 11/06/2018 0  % Final  . Basophils Absolute 11/06/2018 0.0  0.0 - 0.1 K/uL Final  . Immature Granulocytes 11/06/2018 0  % Final  . Abs Immature Granulocytes 11/06/2018 0.01  0.00 - 0.07 K/uL Final   Performed at [facility], [address] [place], [place] [address]    (this displays the last labs from the last 3 days)  No results found for: TOTALPROTELP, ALBUMINELP, A1GS, A2GS, BETS, BETA2SER, GAMS, MSPIKE, SPEI (this displays SPEP labs)  No results found for: KPAFRELGTCHN, LAMBDASER, KAPLAMBRATIO (kappa/lambda light chains)  No results found for: HGBA, HGBA2QUANT, HGBFQUANT, HGBSQUAN (Hemoglobinopathy evaluation)   No results found for: LDH  No results found for: IRON, TIBC, IRONPCTSAT (Iron and TIBC)  No results found for: FERRITIN  Urinalysis    Component Value Date/Time   COLORURINE YELLOW 05/18/2010 1042   APPEARANCEUR CLOUDY (A) 05/18/2010 1042   LABSPEC 1.028 05/18/2010 1042   PHURINE 5.5 05/18/2010 1042   GLUCOSEU NEGATIVE 05/18/2010 1042   HGBUR NEGATIVE 05/18/2010 1042   BILIRUBINUR negative 05/31/2018 1011   KETONESUR NEGATIVE 05/18/2010 1042   PROTEINUR Positive (A) 05/31/2018 1011   PROTEINUR NEGATIVE 05/18/2010 1042   UROBILINOGEN 0.2 05/31/2018 1011   UROBILINOGEN 0.2 05/18/2010 1042   NITRITE NEGATIVE 05/31/2018 1011   NITRITE NEGATIVE 05/18/2010 1042   LEUKOCYTESUR Negative 05/31/2018 1011      STUDIES:  US Breast Ltd Uni Right Inc Axilla  Result Date: 10/28/2018 CLINICAL DATA:  48 year old female with a palpable right breast lump and associated tenderness for approximately 1 week. EXAM: DIGITAL DIAGNOSTIC RIGHT MAMMOGRAM WITH CAD AND TOMO ULTRASOUND RIGHT BREAST COMPARISON:  Previous exam(s). ACR Breast Density Category c: The breast tissue is heterogeneously dense, which may obscure small masses. FINDINGS: Radiopaque BB was placed at the site of the patient's palpable lump in the central right breast. An irregular hyperdense mass with associated increased trabecular thickening and is seen in the deep central right breast. Further evaluation with ultrasound was performed. Mammographic images were processed with CAD. On physical exam, I palpate a firm, fixed mass in the central right breast. Targeted ultrasound is performed, showing an irregular hypoechoic mass at the 8 o'clock position 3 cm from the nipple. It measures 3.6 x 2.9 x 2.3 cm. There is associated peripheral vascularity. Evaluation of the right axilla demonstrates a single prominent lymph node demonstrating 4 mm cortical thickening. The remaining lymph nodes have a normal appearance with a thin, symmetric cortex. IMPRESSION: 1. Suspicious right breast mass corresponding with the patient's palpable lump. 2. Indeterminate right axillary lymph node demonstrating up to 4 mm cortical thickening. RECOMMENDATION: Two area ultrasound-guided biopsy of the right breast and right axilla. If findings demonstrate malignancy, further evaluation with bilateral contrast-enhanced breast MRI is recommended to evaluate extent of disease. I have discussed the findings and recommendations with the patient. Results were also provided in writing at the conclusion of the visit. If applicable, a reminder letter will be sent to the patient regarding the next appointment. BI-RADS CATEGORY  4: Suspicious. Electronically Signed   By: Kristopher Oppenheim M.D.   On:  10/28/2018 13:30   Mm Diag Breast Tomo Uni Right  Result Date: 10/28/2018 CLINICAL DATA:  48 year old female  with a palpable right breast lump and associated tenderness for approximately 1 week. EXAM: DIGITAL DIAGNOSTIC RIGHT MAMMOGRAM WITH CAD AND TOMO ULTRASOUND RIGHT BREAST COMPARISON:  Previous exam(s). ACR Breast Density Category c: The breast tissue is heterogeneously dense, which may obscure small masses. FINDINGS: Radiopaque BB was placed at the site of the patient's palpable lump in the central right breast. An irregular hyperdense mass with associated increased trabecular thickening and is seen in the deep central right breast. Further evaluation with ultrasound was performed. Mammographic images were processed with CAD. On physical exam, I palpate a firm, fixed mass in the central right breast. Targeted ultrasound is performed, showing an irregular hypoechoic mass at the 8 o'clock position 3 cm from the nipple. It measures 3.6 x 2.9 x 2.3 cm. There is associated peripheral vascularity. Evaluation of the right axilla demonstrates a single prominent lymph node demonstrating 4 mm cortical thickening. The remaining lymph nodes have a normal appearance with a thin, symmetric cortex. IMPRESSION: 1. Suspicious right breast mass corresponding with the patient's palpable lump. 2. Indeterminate right axillary lymph node demonstrating up to 4 mm cortical thickening. RECOMMENDATION: Two area ultrasound-guided biopsy of the right breast and right axilla. If findings demonstrate malignancy, further evaluation with bilateral contrast-enhanced breast MRI is recommended to evaluate extent of disease. I have discussed the findings and recommendations with the patient. Results were also provided in writing at the conclusion of the visit. If applicable, a reminder letter will be sent to the patient regarding the next appointment. BI-RADS CATEGORY  4: Suspicious. Electronically Signed   By: Kristopher Oppenheim M.D.   On:  10/28/2018 13:30   [name] Axillary Node Core Biopsy Right  Addendum Date: 10/31/2018   ADDENDUM REPORT: 10/31/2018 09:00 ADDENDUM: Pathology revealed GRADE III INVASIVE DUCTAL CARCINOMA, HIGH GRADE DUCTAL CARCINOMA IN SITU of the Right breast, 8 o'clock. LYMPH NODE, NEGATIVE FOR CARCINOMA of the Right axilla. This was found to be concordant by Dr. Lovey Newcomer. Pathology results were discussed with the patient by telephone. The patient reported doing well after the biopsies with tenderness at the sites. Post biopsy instructions and care were reviewed and questions were answered. The patient was encouraged to call [facility] for any additional concerns. The patient was referred to [facility] on November 06, 2018. Recommendation for a bilateral breast MRI due to family history and heterogeneously dense breasts. Pathology results reported by Terie Purser, RN on 10/31/2018. Electronically Signed   By: Lovey Newcomer M.D.   On: 10/31/2018 09:00   Result Date: 10/31/2018 CLINICAL DATA:  Cortically thickened right axillary lymph node. EXAM: [name] AXILLARY NODE CORE BIOPSY RIGHT COMPARISON:  Previous exam(s). FINDINGS: I met with the patient and we discussed the procedure of ultrasound-guided biopsy, including benefits and alternatives. We discussed the high likelihood of a successful procedure. We discussed the risks of the procedure, including infection, bleeding, tissue injury, clip migration, and inadequate sampling. Informed written consent was given. The usual time-out protocol was performed immediately prior to the procedure. Using sterile technique and 1% Lidocaine as local anesthetic, under direct ultrasound visualization, a 14 gauge spring-loaded device was used to perform biopsy of cortically thickened right axillary lymph node using a lateral approach. At the conclusion of the procedure a HydroMARK tissue marker clip  was deployed into the biopsy cavity. Follow up 2 view mammogram was performed and dictated separately. IMPRESSION: Ultrasound guided biopsy of right axillary lymph node.  No apparent complications. Electronically Signed: By: Lovey Newcomer M.D. On: 10/29/2018 11:46   Mm Clip Placement Right  Result Date: 10/29/2018 CLINICAL DATA:  Patient status post ultrasound-guided biopsy right breast mass and right axillary lymph node. EXAM: DIAGNOSTIC RIGHT MAMMOGRAM POST ULTRASOUND BIOPSY COMPARISON:  Previous exam(s). FINDINGS: Mammographic images were obtained following ultrasound guided biopsy of right breast mass 8 o'clock position and right axillary lymph node. Right breast mass 8 o'clock position: Ribbon shaped marking clip: In appropriate position. Right axillary lymph node: HydroMARK clip: In appropriate position. IMPRESSION: Appropriate position biopsy marking clips status post ultrasound-guided biopsy right breast mass and right axillary lymph node. Final Assessment: Post Procedure Mammograms for Marker Placement Electronically Signed   By: Lovey Newcomer M.D.   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 LYMPH NODE, NEGATIVE FOR CARCINOMA of the Right axilla. This was found to be concordant by Dr. Lovey Newcomer. Pathology results were discussed with the patient by telephone. The patient reported doing well after the biopsies with tenderness at the sites. Post biopsy instructions and care were reviewed and questions were answered. The patient was encouraged to call [facility] for any additional concerns. The patient was referred to [facility] on November 06, 2018. Recommendation for a  bilateral breast MRI due to family history and heterogeneously dense breasts. Pathology results reported by Terie Purser, RN on 10/31/2018. Electronically Signed   By: Lovey Newcomer M.D.   On: 10/31/2018 08:57   Result Date: 10/31/2018 CLINICAL DATA:  Patient with indeterminate right breast mass 8 o'clock position. EXAM: ULTRASOUND GUIDED RIGHT BREAST CORE NEEDLE BIOPSY COMPARISON:  Previous exam(s). FINDINGS: I met with the patient and we discussed the procedure of ultrasound-guided biopsy, including benefits and alternatives. We discussed the high likelihood of a successful procedure. We discussed the risks of the procedure, including infection, bleeding, tissue injury, clip migration, and inadequate sampling. Informed written consent was given. The usual time-out protocol was performed immediately prior to the procedure. Lesion quadrant: Lower outer quadrant Using sterile technique and 1% Lidocaine as local anesthetic, under direct ultrasound visualization, a 12 gauge spring-loaded device was used to perform biopsy of right breast mass 8 o'clock position using a lateral approach. At the conclusion of the procedure a ribbon shaped tissue marker clip was deployed into the biopsy cavity. Follow up 2 view mammogram was performed and dictated separately. IMPRESSION: Ultrasound guided biopsy of right breast mass 8 o'clock position. No apparent complications. Electronically Signed: By: Lovey Newcomer M.D. On: 10/29/2018 11:45     ELIGIBLE FOR AVAILABLE RESEARCH PROTOCOL: consider UPBEAT   ASSESSMENT: 48 y.o. [place], [place] woman status post right breast lower outer quadrant biopsy 10/29/2018 for a clinical T2 N0, stage IIB invasive ductal carcinoma, grade 3, triple negative, with an MIB-1 of 80%  (1) neoadjuvant chemotherapy will consist of doxorubicin and cyclophosphamide in dose dense fashion x4, starting 11/19/2018, to be followed by weekly paclitaxel and carboplatin x12  (2) patient desires ovarian function  preservation: Zoladex to start 11/08/2018  (3) definitive surgery to follow chemo  (4) adjuvant radiation to follow  (5) genetics testing pending    PLAN: I spent approximately 60 minutes face to  face with Brittany Rodgers with more than 50% of that time spent in counseling and coordination of care. Specifically we reviewed the biology of the patient's diagnosis and the specifics of her situation.  We first reviewed the fact that cancer is not one disease but more than 100 different diseases and that it is important to keep them separate-- otherwise when friends and relatives discuss their own cancer experiences with Brittany Rodgers confusion can result. Similarly we explained that if breast cancer spreads to the bone or liver, the patient would not have bone cancer or liver cancer, but breast cancer in the bone and breast cancer in the liver: one cancer in three places-- not 3 different cancers which otherwise would have to be treated in 3 different ways.  We discussed the difference between local and systemic therapy. In terms of loco-regional treatment, lumpectomy plus radiation is equivalent to mastectomy as far as survival is concerned. For this reason, and because the cosmetic results are generally superior, we recommend breast conserving surgery.   We also noted that in terms of sequencing of treatments, whether systemic therapy or surgery is done first does not affect the ultimate outcome.  This is relevant to her case since we believe neoadjuvant chemotherapy is in her best interest, both in terms of optimizing surgery and giving her information regarding tumor response.  We then discussed the rationale for systemic therapy. There is some risk that this cancer may have already spread to other parts of her body. Scans cannot answer the question the patient really would like to know, which is whether she has microscopic disease elsewhere in her body.  In her case however it will be important to rule out stage  IV disease and we are obtaining a bone scan and CT scan of the chest before the start of treatment.  Of course she will also need a breast MRI for baseline.  Next we went over the options for systemic therapy which are anti-estrogens, anti-HER-2 immunotherapy, and chemotherapy. Brittany Rodgers does not meet criteria for anti-HER-2 immunotherapy or anti-estrogens.  Her only option for systemic treatment is chemotherapy and that accordingly is what we recommend.  Specifically she will receive doxorubicin and cyclophosphamide in dose dense fashion x4 followed by paclitaxel and carboplatin both given weekly x12.  We discussed the possible toxicities side effects and complications of these agents in detail today.  We are hoping to start chemotherapy 11/19/2018 assuming her port can be placed before that date.  Brittany Rodgers understands chemotherapy may well render her infertile.  She and her husband are interested in possibly having children.  She would like fertility preservation if possible.  Accordingly she will start on Zoladex later this week.  She understands this does not guarantee that she will keep her fertility but it will make it more likely.  Brittany Rodgers has a good understanding of the overall plan. She agrees with it. She knows the goal of treatment in her case is cure. She will call with any problems that may develop before her next visit here.   Kamerin Grumbine, Brittany Dad, MD  11/06/18 5:19 PM Medical Oncology and Hematology [facility] [address] [place], [place] [address] Tel. [phone]    Fax. [phone]    I, Jacqualyn Posey am acting as a Education administrator for Chauncey Cruel, MD.   I, Lurline Del MD, have reviewed the above documentation for accuracy and completeness, and I agree with the above.

## 2018-11-06 ENCOUNTER — Ambulatory Visit: Payer: BLUE CROSS/BLUE SHIELD | Attending: General Surgery | Admitting: Physical Therapy

## 2018-11-06 ENCOUNTER — Encounter: Payer: Self-pay | Admitting: Radiation Oncology

## 2018-11-06 ENCOUNTER — Other Ambulatory Visit: Payer: Self-pay | Admitting: General Surgery

## 2018-11-06 ENCOUNTER — Inpatient Hospital Stay: Payer: BLUE CROSS/BLUE SHIELD

## 2018-11-06 ENCOUNTER — Other Ambulatory Visit: Payer: Self-pay

## 2018-11-06 ENCOUNTER — Ambulatory Visit
Admission: RE | Admit: 2018-11-06 | Discharge: 2018-11-06 | Disposition: A | Discharge status: Home / Self Care | Payer: BLUE CROSS/BLUE SHIELD | Source: Ambulatory Visit | Attending: Radiation Oncology | Admitting: Radiation Oncology

## 2018-11-06 ENCOUNTER — Encounter: Payer: Self-pay | Admitting: Physical Therapy

## 2018-11-06 ENCOUNTER — Inpatient Hospital Stay (HOSPITAL_BASED_OUTPATIENT_CLINIC_OR_DEPARTMENT_OTHER): Payer: BLUE CROSS/BLUE SHIELD | Admitting: Oncology

## 2018-11-06 ENCOUNTER — Encounter: Payer: Self-pay | Admitting: Oncology

## 2018-11-06 VITALS — BP 170/95 | HR 85 | Temp 99.1°F | Resp 18 | Ht 62.0 in | Wt 157.9 lb

## 2018-11-06 DIAGNOSIS — M25511 Pain in right shoulder: Secondary | ICD-10-CM | POA: Insufficient documentation

## 2018-11-06 DIAGNOSIS — C50511 Malignant neoplasm of lower-outer quadrant of right female breast: Secondary | ICD-10-CM

## 2018-11-06 DIAGNOSIS — G8929 Other chronic pain: Secondary | ICD-10-CM | POA: Diagnosis present

## 2018-11-06 DIAGNOSIS — Z171 Estrogen receptor negative status [ER-]: Secondary | ICD-10-CM

## 2018-11-06 DIAGNOSIS — Z79899 Other long term (current) drug therapy: Secondary | ICD-10-CM

## 2018-11-06 DIAGNOSIS — M25611 Stiffness of right shoulder, not elsewhere classified: Secondary | ICD-10-CM | POA: Insufficient documentation

## 2018-11-06 DIAGNOSIS — Z5111 Encounter for antineoplastic chemotherapy: Secondary | ICD-10-CM

## 2018-11-06 LAB — CBC WITH DIFFERENTIAL (CANCER CENTER ONLY)
ABS IMMATURE GRANULOCYTES: 0.01 10*3/uL (ref 0.00–0.07)
BASOS ABS: 0 10*3/uL (ref 0.0–0.1)
Basophils Relative: 0 %
Eosinophils Absolute: 0.1 10*3/uL (ref 0.0–0.5)
Eosinophils Relative: 1 %
HCT: 41.5 % (ref 36.0–46.0)
HEMOGLOBIN: 13.4 g/dL (ref 12.0–15.0)
IMMATURE GRANULOCYTES: 0 %
LYMPHS PCT: 34 %
Lymphs Abs: 1.4 10*3/uL (ref 0.7–4.0)
MCH: 28.6 pg (ref 26.0–34.0)
MCHC: 32.3 g/dL (ref 30.0–36.0)
MCV: 88.7 fL (ref 80.0–100.0)
MONO ABS: 0.4 10*3/uL (ref 0.1–1.0)
Monocytes Relative: 8 %
NEUTROS ABS: 2.4 10*3/uL (ref 1.7–7.7)
NEUTROS PCT: 57 %
NRBC: 0 % (ref 0.0–0.2)
Platelet Count: 392 10*3/uL (ref 150–400)
RBC: 4.68 MIL/uL (ref 3.87–5.11)
RDW: 12.4 % (ref 11.5–15.5)
WBC: 4.3 10*3/uL (ref 4.0–10.5)

## 2018-11-06 LAB — CMP (CANCER CENTER ONLY)
ALT: 25 U/L (ref 0–44)
AST: 11 U/L — ABNORMAL LOW (ref 15–41)
Albumin: 4.4 g/dL (ref 3.5–5.0)
Alkaline Phosphatase: 100 U/L (ref 38–126)
Anion gap: 9 (ref 5–15)
BUN: 11 mg/dL (ref 6–20)
CHLORIDE: 103 mmol/L (ref 98–111)
CO2: 28 mmol/L (ref 22–32)
Calcium: 9.3 mg/dL (ref 8.9–10.3)
Creatinine: 0.86 mg/dL (ref 0.44–1.00)
GFR, Est AFR Am: >60 mL/min (ref >60)
Glucose, Bld: 93 mg/dL (ref 70–99)
POTASSIUM: 4.1 mmol/L (ref 3.5–5.1)
Sodium: 140 mmol/L (ref 135–145)
Total Bilirubin: 0.5 mg/dL (ref 0.3–1.2)
Total Protein: 8 g/dL (ref 6.5–8.1)

## 2018-11-06 NOTE — Progress Notes (Signed)
Radiation Oncology         [phone] ________________________________  Initial outpatient Consultation  Name: Celso Amy MRN: [id number]  Date: 11/06/2018  DOB: [birth year]  CC:Fry, Ishmael Holter, MD  [name], MD   REFERRING PHYSICIAN: [name], MD  DIAGNOSIS:    ICD-10-CM   1. Malignant neoplasm of lower-outer quadrant of right breast of female, estrogen receptor negative ([place]) C50.511    Z17.1    Cancer Staging Malignant neoplasm of lower-outer quadrant of right breast of female, estrogen receptor negative ([place]) Staging form: Breast, AJCC 8th Edition - Clinical stage from 11/06/2018: Stage IIB (cT2, cN0, cM0, G3, ER-, PR-, HER2-) - Unsigned   CHIEF COMPLAINT: Here to discuss management of right breast cancer  HISTORY OF PRESENT ILLNESS::[name] Noland Fordyce is a 48 y.o. female who presented with a palpable right breast mass and abnormality on the following imaging: mammography and [name].  [name] determined a 8:00 right breast mass posteriorly, measuring 3.6 cm. There was also a 70m axillary node, indeterminate. 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  Biopsy of axillary node on right was negative and concordant.  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  She is hoping to get pregnant again in the future with her new husband.  She discussed fertility preservation with Dr. MJana Hakim  Review of systems is positive for fatigue, pain in her breast back and head, runny nose, coughing which is dry, abdominal pain, lump in her breast, pain in her breast, headaches  PREVIOUS RADIATION THERAPY: No  PAST MEDICAL HISTORY:  has a past medical history of Chronic cough, GERD (gastroesophageal reflux disease), Hyperlipidemia, IBS (irritable bowel syndrome), and Routine gynecological examination.    PAST SURGICAL HISTORY: Past Surgical History:  Procedure Laterality Date  . CESAREAN SECTION    . ROOT CANAL      FAMILY  HISTORY: family history includes Breast cancer in her maternal aunt and another family member; Hypertension in an other family member.  SOCIAL HISTORY:  reports that she has never smoked. She has never used smokeless tobacco. She reports that she does not drink alcohol or use drugs.  ALLERGIES: Barbiturates and Sulfonamide derivatives  MEDICATIONS:  Current Outpatient Medications  Medication Sig Dispense Refill  . atorvastatin (LIPITOR) 20 MG tablet Take 1 tablet (20 mg total) by mouth daily. 90 tablet 3  . cyclobenzaprine (FLEXERIL) 10 MG tablet TAKE 1 TABLET BY MOUTH THREE TIMES DAILY AS NEEDED FOR MUSCLE SPASMS 90 tablet 0  . Omeprazole-Sodium Bicarbonate (ZEGERID) 20-1100 MG CAPS capsule Take 1 capsule by mouth daily before breakfast. 90 each 3  . polyethylene glycol (MIRALAX / GLYCOLAX) packet Take 17 g by mouth as needed. (Patient taking differently: Take 17 g by mouth daily as needed (constipation). Mix in 8 oz liquid and drink) 100 each 5  . potassium chloride (K-DUR) 10 MEQ tablet TAKE 1 TABLET BY MOUTH ONCE DAILY 90 tablet 3   No current facility-administered medications for this encounter.     REVIEW OF SYSTEMS: A 10+ POINT REVIEW OF SYSTEMS WAS OBTAINED including neurology, dermatology, psychiatry, cardiac, respiratory, lymph, extremities, GI, GU, Musculoskeletal, constitutional, breasts, reproductive, HEENT.  All pertinent positives are noted in the HPI.  All others are negative.   PHYSICAL EXAM:   Vitals with Age-Percentiles 11/06/2018  Length 1622.2cm  Systolic 1979 Diastolic 95  Pulse 85  Respiration 18  Weight 71.623 kg  BMI 28.87  VISIT REPORT  General: Alert and oriented, in no acute distress HEENT: Head is normocephalic. Extraocular movements are intact. Oropharynx is clear. Neck: Neck is supple, no palpable cervical or supraclavicular lymphadenopathy. Heart: Regular in rate and rhythm with no murmurs, rubs, or gallops. Chest: Clear to auscultation bilaterally,  with no rhonchi, wheezes, or rales. Abdomen: Soft, nontender, nondistended, with no rigidity or guarding. Extremities: No cyanosis or edema. Lymphatics: see Neck Exam Skin: No concerning lesions. Musculoskeletal: symmetric strength and muscle tone throughout. Neurologic: Cranial nerves II through XII are grossly intact. No obvious focalities. Speech is fluent. Coordination is intact. Psychiatric: Judgment and insight are intact. Affect is appropriate. Breasts: In the central right breast there is a mass that is several centimeters in dimension. No other palpable masses appreciated in the breasts or axillae  ECOG =0  0 - Asymptomatic (Fully active, able to carry on all predisease activities without restriction)  1 - Symptomatic but completely ambulatory (Restricted in physically strenuous activity but ambulatory and able to carry out work of a light or sedentary nature. For example, light housework, office work)  2 - Symptomatic, <50% in bed during the day (Ambulatory and capable of all self care but unable to carry out any work activities. Up and about more than 50% of waking hours)  3 - Symptomatic, >50% in bed, but not bedbound (Capable of only limited self-care, confined to bed or chair 50% or more of waking hours)  4 - Bedbound (Completely disabled. Cannot carry on any self-care. Totally confined to bed or chair)  5 - Death   Eustace Pen MM, Creech RH, Tormey DC, et al. [phone]). "Toxicity and response criteria of the [facility]". [place] Oncol. 5 (6): 649-55   LABORATORY DATA:  Lab Results  Component Value Date   WBC 4.3 11/06/2018   HGB 13.4 11/06/2018   HCT 41.5 11/06/2018   MCV 88.7 11/06/2018   PLT 392 11/06/2018   CMP     Component Value Date/Time   NA 140 11/06/2018 0821   K 4.1 11/06/2018 0821   CL 103 11/06/2018 0821   CO2 28 11/06/2018 0821   GLUCOSE 93 11/06/2018 0821   BUN 11 11/06/2018 0821   CREATININE 0.86 11/06/2018 0821   CALCIUM  9.3 11/06/2018 0821   PROT 8.0 11/06/2018 0821   ALBUMIN 4.4 11/06/2018 0821   AST 11 (L) 11/06/2018 0821   ALT 25 11/06/2018 0821   ALKPHOS 100 11/06/2018 0821   BILITOT 0.5 11/06/2018 0821   GFRNONAA >60 11/06/2018 0821   GFRAA >60 11/06/2018 0821         RADIOGRAPHY: US Breast Ltd Uni Right Inc Axilla  Result Date: 10/28/2018 CLINICAL DATA:  48 year old female with a palpable right breast lump and associated tenderness for approximately 1 week. EXAM: DIGITAL DIAGNOSTIC RIGHT MAMMOGRAM WITH CAD AND TOMO ULTRASOUND RIGHT BREAST COMPARISON:  Previous exam(s). ACR Breast Density Category c: The breast tissue is heterogeneously dense, which may obscure small masses. FINDINGS: Radiopaque BB was placed at the site of the patient's palpable lump in the central right breast. An irregular hyperdense mass with associated increased trabecular thickening and is seen in the deep central right breast. Further evaluation with ultrasound was performed. Mammographic images were processed with CAD. On physical exam, I palpate a firm, fixed mass in the central right breast. Targeted ultrasound is performed, showing an irregular hypoechoic mass at the 8 o'clock position 3 cm from the nipple. It measures 3.6 x 2.9 x 2.3 cm. There is associated  peripheral vascularity. Evaluation of the right axilla demonstrates a single prominent lymph node demonstrating 4 mm cortical thickening. The remaining lymph nodes have a normal appearance with a thin, symmetric cortex. IMPRESSION: 1. Suspicious right breast mass corresponding with the patient's palpable lump. 2. Indeterminate right axillary lymph node demonstrating up to 4 mm cortical thickening. RECOMMENDATION: Two area ultrasound-guided biopsy of the right breast and right axilla. If findings demonstrate malignancy, further evaluation with bilateral contrast-enhanced breast MRI is recommended to evaluate extent of disease. I have discussed the findings and recommendations with  the patient. Results were also provided in writing at the conclusion of the visit. If applicable, a reminder letter will be sent to the patient regarding the next appointment. BI-RADS CATEGORY  4: Suspicious. Electronically Signed   By: Kristopher Oppenheim M.D.   On: 10/28/2018 13:30   Mm Diag Breast Tomo Uni Right  Result Date: 10/28/2018 CLINICAL DATA:  48 year old female with a palpable right breast lump and associated tenderness for approximately 1 week. EXAM: DIGITAL DIAGNOSTIC RIGHT MAMMOGRAM WITH CAD AND TOMO ULTRASOUND RIGHT BREAST COMPARISON:  Previous exam(s). ACR Breast Density Category c: The breast tissue is heterogeneously dense, which may obscure small masses. FINDINGS: Radiopaque BB was placed at the site of the patient's palpable lump in the central right breast. An irregular hyperdense mass with associated increased trabecular thickening and is seen in the deep central right breast. Further evaluation with ultrasound was performed. Mammographic images were processed with CAD. On physical exam, I palpate a firm, fixed mass in the central right breast. Targeted ultrasound is performed, showing an irregular hypoechoic mass at the 8 o'clock position 3 cm from the nipple. It measures 3.6 x 2.9 x 2.3 cm. There is associated peripheral vascularity. Evaluation of the right axilla demonstrates a single prominent lymph node demonstrating 4 mm cortical thickening. The remaining lymph nodes have a normal appearance with a thin, symmetric cortex. IMPRESSION: 1. Suspicious right breast mass corresponding with the patient's palpable lump. 2. Indeterminate right axillary lymph node demonstrating up to 4 mm cortical thickening. RECOMMENDATION: Two area ultrasound-guided biopsy of the right breast and right axilla. If findings demonstrate malignancy, further evaluation with bilateral contrast-enhanced breast MRI is recommended to evaluate extent of disease. I have discussed the findings and recommendations with the  patient. Results were also provided in writing at the conclusion of the visit. If applicable, a reminder letter will be sent to the patient regarding the next appointment. BI-RADS CATEGORY  4: Suspicious. Electronically Signed   By: Kristopher Oppenheim M.D.   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 LYMPH NODE, NEGATIVE FOR CARCINOMA of the Right axilla. This was found to be concordant by Dr. Lovey Newcomer. Pathology results were discussed with the patient by telephone. The patient reported doing well after the biopsies with tenderness at the sites. Post biopsy instructions and care were reviewed and questions were answered. The patient was encouraged to call [facility] for any additional concerns. The patient was referred to [facility] on November 06, 2018. Recommendation for a bilateral breast MRI due to family history and heterogeneously dense breasts. Pathology results reported by Terie Purser, RN on 10/31/2018. Electronically Signed   By: Polly Cobia.D.  On: 10/31/2018 09:00   Result Date: 10/31/2018 CLINICAL DATA:  Cortically thickened right axillary lymph node. EXAM: [name] AXILLARY NODE CORE BIOPSY RIGHT COMPARISON:  Previous exam(s). FINDINGS: I met with the patient and we discussed the procedure of ultrasound-guided biopsy, including benefits and alternatives. We discussed the high likelihood of a successful procedure. We discussed the risks of the procedure, including infection, bleeding, tissue injury, clip migration, and inadequate sampling. Informed written consent was given. The usual time-out protocol was performed immediately prior to the procedure. Using sterile technique and 1% Lidocaine as  local anesthetic, under direct ultrasound visualization, a 14 gauge spring-loaded device was used to perform biopsy of cortically thickened right axillary lymph node using a lateral approach. At the conclusion of the procedure a HydroMARK tissue marker clip was deployed into the biopsy cavity. Follow up 2 view mammogram was performed and dictated separately. IMPRESSION: Ultrasound guided biopsy of right axillary lymph node. No apparent complications. Electronically Signed: By: Lovey Newcomer M.D. On: 10/29/2018 11:46   Mm Clip Placement Right  Result Date: 10/29/2018 CLINICAL DATA:  Patient status post ultrasound-guided biopsy right breast mass and right axillary lymph node. EXAM: DIAGNOSTIC RIGHT MAMMOGRAM POST ULTRASOUND BIOPSY COMPARISON:  Previous exam(s). FINDINGS: Mammographic images were obtained following ultrasound guided biopsy of right breast mass 8 o'clock position and right axillary lymph node. Right breast mass 8 o'clock position: Ribbon shaped marking clip: In appropriate position. Right axillary lymph node: HydroMARK clip: In appropriate position. IMPRESSION: Appropriate position biopsy marking clips status post ultrasound-guided biopsy right breast mass and right axillary lymph node. Final Assessment: Post Procedure Mammograms for Marker Placement Electronically Signed   By: Lovey Newcomer M.D.   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 LYMPH NODE, NEGATIVE FOR CARCINOMA of the Right axilla. This was found to be concordant by Dr. Lovey Newcomer. Pathology results were discussed with the patient by telephone. The patient reported doing well after the biopsies with tenderness at the sites. Post biopsy instructions and care were reviewed and questions were answered. The  patient was encouraged to call [facility] for any additional concerns. The patient was referred to [facility] on November 06, 2018. Recommendation for a bilateral breast MRI due to family history and heterogeneously dense breasts. Pathology results reported by Terie Purser, RN on 10/31/2018. Electronically Signed   By: Lovey Newcomer M.D.   On: 10/31/2018 08:57   Result Date: 10/31/2018 CLINICAL DATA:  Patient with indeterminate right breast mass 8 o'clock position. EXAM: ULTRASOUND GUIDED RIGHT BREAST CORE NEEDLE BIOPSY COMPARISON:  Previous exam(s). FINDINGS: I met with the patient and we discussed the procedure of ultrasound-guided biopsy, including benefits and alternatives. We discussed the high likelihood of a successful procedure. We discussed the risks of the procedure, including infection, bleeding, tissue injury, clip migration, and inadequate sampling. Informed written consent was given. The usual time-out protocol was performed immediately prior to the procedure. Lesion quadrant: Lower outer quadrant Using sterile technique and 1% Lidocaine as local anesthetic, under direct ultrasound visualization, a 12 gauge spring-loaded device was used to perform biopsy of right breast mass 8 o'clock position using a lateral approach. At the conclusion of the procedure a  ribbon shaped tissue marker clip was deployed into the biopsy cavity. Follow up 2 view mammogram was performed and dictated separately. IMPRESSION: Ultrasound guided biopsy of right breast mass 8 o'clock position. No apparent complications. Electronically Signed: By: Lovey Newcomer M.D. On: 10/29/2018 11:45      IMPRESSION/PLAN: She has been discussed at our multidisciplinary tumor board.  The consensus is that she would be a good candidate for breast conservation given a good response to neoadjuvant chemotherapy. She is enthusiastic about breast  conservation.  Plan is for neoadjuvant chemotherapy, followed by breast conserving surgery, and then radiotherapy.  It was a pleasure meeting the patient today. We discussed the risks, benefits, and side effects of radiotherapy. I recommend radiotherapy to the right breast to reduce her risk of locoregional recurrence by 2/3.  We discussed that radiation would take approximately 4-6 weeks to complete (depending on her nodal status) and that I would give the patient a few weeks to heal following surgery before starting treatment planning. We spoke about acute effects including skin irritation and fatigue as well as much less common late effects including internal organ injury or irritation. We spoke about the latest technology that is used to minimize the risk of late effects for patients undergoing radiotherapy to the breast or chest wall. No guarantees of treatment were given. The patient is enthusiastic about proceeding with treatment. I look forward to participating in the patient's care.  I will await her referral back to me for postoperative follow-up and eventual CT simulation/treatment planning.  I did make sure that she is aware that it is important she not try to get pregnant during her oncologic treatments, including radiotherapy, as this can cause harm to the fetus. We discussed condom use.  She demonstrated understanding of this. __________________________________________   [name], MD

## 2018-11-06 NOTE — Patient Instructions (Signed)

## 2018-11-06 NOTE — Therapy (Signed)
[place], [place], [address] Phone: [phone]   Fax:  [phone]  Physical Therapy Evaluation  Patient Details  Name: Brittany Rodgers MRN: [id number] Date of Birth: Jan 02, 1971 Referring Provider (PT): Dr. Rolm Bookbinder   Encounter Date: 11/06/2018  PT End of Session - 11/06/18 1349    Visit Number  1    Number of Visits  1    PT Start Time  1118    PT Stop Time  1141    PT Time Calculation (min)  23 min    Activity Tolerance  Patient tolerated treatment well    Behavior During Therapy  [facility] for tasks assessed/performed       Past Medical History:  Diagnosis Date  . Chronic cough   . GERD (gastroesophageal reflux disease)   . Hyperlipidemia   . IBS (irritable bowel syndrome)    sees Dr. Silvano Rusk   . Routine gynecological examination    sees Dr. Freda Munro    Past Surgical History:  Procedure Laterality Date  . CESAREAN SECTION    . ROOT CANAL      There were no vitals filed for this visit.   Subjective Assessment - 11/06/18 1338    Subjective  Patient reports she is here today to be seen by her medical team for her newly diagnosed right breast cancer.    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    Patient Stated Goals  Reduce risk of lymphedema and learn post op shoulder ROM HEP    Currently in Pain?  Yes    Pain Score  8     Pain Location  Scapula    Pain Orientation  Right    Pain Descriptors / Indicators  Aching    Pain Type  Chronic pain    Pain Onset  More than a month ago    Pain Frequency  Intermittent    Aggravating Factors   Using right arm    Pain Relieving Factors  Unknown         [facility] PT Assessment - 11/06/18 0001      Assessment   Medical Diagnosis  Right breast cancer    Referring Provider (PT)   Dr. Rolm Bookbinder    Onset Date/Surgical Date  10/28/18    Hand Dominance  Right    Prior Therapy  none      Precautions   Precautions  Other (comment)    Precaution Comments  Active cancer      Restrictions   Weight Bearing Restrictions  No      Balance Screen   Has the patient fallen in the past 6 months  No    Has the patient had a decrease in activity level because of a fear of falling?   No    Is the patient reluctant to leave their home because of a fear of falling?   No      Home Environment   Living Environment  Private residence    Living Arrangements  Spouse/significant other    Available Help at Discharge  Family      Prior Function   Level of Independence  Independent    Vocation  Unemployed    Leisure  She walks 2x/week for 20 min      Cognition  Overall Cognitive Status  Within Functional Limits for tasks assessed      Posture/Postural Control   Posture/Postural Control  Postural limitations    Postural Limitations  Rounded Shoulders;Forward head      ROM / Strength   AROM / PROM / Strength  AROM;Strength      AROM   AROM Assessment Site  Shoulder;Cervical    Right/Left Shoulder  Right;Left    Right Shoulder Extension  38 Degrees    Right Shoulder Flexion  115 Degrees    Right Shoulder ABduction  107 Degrees    Right Shoulder Internal Rotation  58 Degrees    Right Shoulder External Rotation  90 Degrees    Left Shoulder Extension  40 Degrees    Left Shoulder Flexion  114 Degrees    Left Shoulder ABduction  135 Degrees    Left Shoulder Internal Rotation  62 Degrees    Left Shoulder External Rotation  80 Degrees    Cervical Flexion  WNL    Cervical Extension  WNL    Cervical - Right Side Bend  WNL    Cervical - Left Side Bend  WNL    Cervical - Right Rotation  WNL    Cervical - Left Rotation  WNL      Strength   Overall Strength  Within functional limits for tasks performed        LYMPHEDEMA/ONCOLOGY QUESTIONNAIRE - 11/06/18 1348       Type   Cancer Type  Right breast cancer      Lymphedema Assessments   Lymphedema Assessments  Upper extremities      Right Upper Extremity Lymphedema   10 cm Proximal to Olecranon Process  30 cm    Olecranon Process  24.9 cm    10 cm Proximal to Ulnar Styloid Process  22.4 cm    Just Proximal to Ulnar Styloid Process  16.3 cm    Across Hand at [employer]  17.9 cm    At [place] of 2nd Digit  6.3 cm      Left Upper Extremity Lymphedema   10 cm Proximal to Olecranon Process  30 cm    Olecranon Process  25.2 cm    10 cm Proximal to Ulnar Styloid Process  22.3 cm    Just Proximal to Ulnar Styloid Process  16.3 cm    Across Hand at [employer]  18.2 cm    At [place] of 2nd Digit  6.1 cm          Quick Dash - 11/06/18 0001    Open a tight or new jar  No difficulty    Do heavy household chores (wash walls, wash floors)  No difficulty    Carry a shopping bag or briefcase  No difficulty    Wash your back  No difficulty    Use a knife to cut food  No difficulty    Recreational activities in which you take some force or impact through your arm, shoulder, or hand (golf, hammering, tennis)  No difficulty    During the past week, to what extent has your arm, shoulder or hand problem interfered with your normal social activities with family, friends, neighbors, or groups?  Not at all    During the past week, to what extent has your arm, shoulder or hand problem limited your work or other regular daily activities  Not at all    Arm, shoulder, or hand pain.  None    Tingling (  pins and needles) in your arm, shoulder, or hand  None    Difficulty Sleeping  No difficulty    DASH Score  0 %        Objective measurements completed on examination: See above findings.     Patient was instructed today in a home exercise program today for post op shoulder range of motion. These included active assist shoulder flexion in sitting, scapular retraction, wall walking with shoulder abduction, and  hands behind head external rotation.  She was encouraged to do these twice a day, holding 3 seconds and repeating 5 times when permitted by her physician.      PT Education - 11/06/18 1349    Education Details  Lymphedema risk reduction and post op shoulder ROM HEP    Person(s) Educated  Patient    Methods  Explanation;Demonstration;Handout    Comprehension  Returned demonstration;Verbalized understanding           Breast Clinic Goals - 11/06/18 1418      Patient will be able to verbalize understanding of pertinent lymphedema risk reduction practices relevant to her diagnosis specifically related to skin care.   Time  1    Period  Days    Status  Achieved      Patient will be able to return demonstrate and/or verbalize understanding of the post-op home exercise program related to regaining shoulder range of motion.   Time  1    Period  Days    Status  Achieved      Patient will be able to verbalize understanding of the importance of attending the postoperative After Breast Cancer Class for further lymphedema risk reduction education and therapeutic exercise.   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 Her multidisciplinary medical team met prior to her assessments to determine a recommended treatment plan. She is planning to have neoadjuvant chemotherapy followed by a right lumpectomy and sentinel node biopsy, and radiation. She will benefit from a post op PT reassessment to determine needs.    History and Personal Factors relevant to plan of care:  None    Clinical Presentation  Evolving    Clinical Presentation due to:  Staging scans scheduled to determine extent of disease    Clinical Decision Making  Low    Rehab Potential  Good    Clinical Impairments Affecting Rehab  Potential  Unknown extent of disease    PT Frequency  One time visit    PT Treatment/Interventions  ADLs/Self Care Home Management;Patient/family education;Therapeutic exercise    PT Next Visit Plan  Will reassess post op if MD refers    PT Home Exercise Plan  Post op shoulder ROM HEP    Consulted and Agree with Plan of Care  Patient;Family member/caregiver    Family Member Consulted  Husband       Patient will benefit from skilled therapeutic intervention in order to improve the following deficits and impairments:  Decreased range of motion, Impaired UE functional use, Decreased knowledge of precautions, Pain, Postural dysfunction  Visit Diagnosis: Malignant neoplasm of lower-outer quadrant of right breast of female, estrogen receptor negative ([place]) - Plan: PT plan of care cert/re-cert  Stiffness of right shoulder, not elsewhere  classified - Plan: PT plan of care cert/re-cert  Chronic right shoulder pain - Plan: PT plan of care cert/re-cert   Patient will follow up at outpatient cancer rehab 3-4 weeks following surgery.  If the patient requires physical therapy at that time, a specific plan will be dictated and sent to the referring physician for approval. The patient was educated today on appropriate basic range of motion exercises to begin post operatively and the importance of attending the After Breast Cancer class following surgery.  Patient was educated today on lymphedema risk reduction practices as it pertains to recommendations that will benefit the patient immediately following surgery.  She verbalized good understanding.     Problem List Patient Active Problem List   Diagnosis Date Noted  . Malignant neoplasm of lower-outer quadrant of right breast of female, estrogen receptor negative ([place]) 11/04/2018  . Thoracic spine pain 05/03/2018  . Hypokalemia 02/13/2017  . Chronic right hip pain 11/26/2015  . Hyperlipidemia 12/23/2010  . ALLERGIC RHINITIS CAUSE UNSPECIFIED  07/24/2008  . GERD 10/14/2007  . IBS 10/14/2007   Annia Friendly, PT 11/06/18 2:19 PM  [place], [place], [address] Phone: [phone]   Fax:  [phone]  Name: Brittany Rodgers MRN: [id number] Date of Birth: 1971-09-04

## 2018-11-06 NOTE — Progress Notes (Signed)

## 2018-11-06 NOTE — Progress Notes (Signed)
Nutrition Assessment  Reason for Assessment:  Pt seen in Breast Clinic  ASSESSMENT:   48 year old female with new diagnosis of triple negative breast cancer.  Past medical history reviewed.  Patient overwhelmed, tears in eyes during visit.  Did not say anything.   Medications:  reviewed  Labs: reviewed  Anthropometrics:   Height: 62 inches Weight: 157 lb 14.4 oz BMI: 28   NUTRITION DIAGNOSIS: Food and nutrition related knowledge deficit related to new diagnosis of breast cancer as evidenced by no prior need for nutrition related information.  INTERVENTION:   Discussed and provided packet of information regarding nutritional tips for breast cancer patients.  Questions answered.  Teachback method used.  Contact information provided and patient knows to contact me with questions/concerns.    MONITORING, EVALUATION, and GOAL: Pt will consume a healthy plant based diet to maintain lean body mass throughout treatment.   Yotam Rhine B. Zenia Resides, [place], [place] Registered Dietitian [phone] (pager)

## 2018-11-07 ENCOUNTER — Encounter (HOSPITAL_BASED_OUTPATIENT_CLINIC_OR_DEPARTMENT_OTHER): Payer: Self-pay | Admitting: *Deleted

## 2018-11-07 ENCOUNTER — Telehealth: Payer: Self-pay

## 2018-11-07 ENCOUNTER — Telehealth: Payer: Self-pay | Admitting: Oncology

## 2018-11-07 NOTE — Telephone Encounter (Signed)
Called regarding 2/14 and so on

## 2018-11-07 NOTE — Telephone Encounter (Signed)
Left VM with patient introducing self as an Therapist, sports from [employer]. Explained that I received a referral from [name] and told I would try to touch base with her tomorrow at her flush appt. Provided her with my phone number as well.

## 2018-11-08 ENCOUNTER — Ambulatory Visit: Payer: BLUE CROSS/BLUE SHIELD

## 2018-11-08 ENCOUNTER — Inpatient Hospital Stay: Payer: BLUE CROSS/BLUE SHIELD

## 2018-11-08 VITALS — BP 148/91 | HR 95 | Temp 98.7°F | Resp 18

## 2018-11-08 DIAGNOSIS — C50511 Malignant neoplasm of lower-outer quadrant of right female breast: Secondary | ICD-10-CM | POA: Diagnosis not present

## 2018-11-08 DIAGNOSIS — Z171 Estrogen receptor negative status [ER-]: Principal | ICD-10-CM

## 2018-11-08 MED ORDER — GOSERELIN ACETATE 3.6 MG ~~LOC~~ IMPL
3.6000 mg | DRUG_IMPLANT | Freq: Once | SUBCUTANEOUS | Status: AC
  Administered 2018-11-08: 3.6 mg via SUBCUTANEOUS

## 2018-11-08 MED ORDER — GOSERELIN ACETATE 3.6 MG ~~LOC~~ IMPL
DRUG_IMPLANT | SUBCUTANEOUS | Status: AC
  Filled 2018-11-08: qty 3.6

## 2018-11-08 NOTE — Patient Instructions (Signed)
Goserelin injection What is this medicine? GOSERELIN (GOE se rel in) is similar to a hormone found in the body. It lowers the amount of sex hormones that the body makes. Men will have lower testosterone levels and women will have lower estrogen levels while taking this medicine. In men, this medicine is used to treat prostate cancer; the injection is either given once per month or once every 12 weeks. A once per month injection (only) is used to treat women with endometriosis, dysfunctional uterine bleeding, or advanced breast cancer. This medicine may be used for other purposes; ask your health care provider or pharmacist if you have questions. COMMON BRAND NAME(S): Zoladex What should I tell my health care provider before I take this medicine? They need to know if you have any of these conditions (some only apply to women): -diabetes -heart disease or previous heart attack -high blood pressure -high cholesterol -kidney disease -osteoporosis or low bone density -problems passing urine -spinal cord injury -stroke -tobacco smoker -an unusual or allergic reaction to goserelin, hormone therapy, other medicines, foods, dyes, or preservatives -pregnant or trying to get pregnant -breast-feeding How should I use this medicine? This medicine is for injection under the skin. It is given by a health care professional in a hospital or clinic setting. Men receive this injection once every 4 weeks or once every 12 weeks. Women will only receive the once every 4 weeks injection. Talk to your pediatrician regarding the use of this medicine in children. Special care may be needed. Overdosage: If you think you have taken too much of this medicine contact a poison control center or emergency room at once. NOTE: This medicine is only for you. Do not share this medicine with others. What if I miss a dose? It is important not to miss your dose. Call your doctor or health care professional if you are unable to  keep an appointment. What may interact with this medicine? -female hormones like estrogen -herbal or dietary supplements like black cohosh, chasteberry, or DHEA -female hormones like testosterone -prasterone This list may not describe all possible interactions. Give your health care provider a list of all the medicines, herbs, non-prescription drugs, or dietary supplements you use. Also tell them if you smoke, drink alcohol, or use illegal drugs. Some items may interact with your medicine. What should I watch for while using this medicine? Visit your doctor or health care professional for regular checks on your progress. Your symptoms may appear to get worse during the first weeks of this therapy. Tell your doctor or healthcare professional if your symptoms do not start to get better or if they get worse after this time. Your bones may get weaker if you take this medicine for a long time. If you smoke or frequently drink alcohol you may increase your risk of bone loss. A family history of osteoporosis, chronic use of drugs for seizures (convulsions), or corticosteroids can also increase your risk of bone loss. Talk to your doctor about how to keep your bones strong. This medicine should stop regular monthly menstration in women. Tell your doctor if you continue to menstrate. Women should not become pregnant while taking this medicine or for 12 weeks after stopping this medicine. Women should inform their doctor if they wish to become pregnant or think they might be pregnant. There is a potential for serious side effects to an unborn child. Talk to your health care professional or pharmacist for more information. Do not breast-feed an infant while taking   this medicine. Men should inform their doctors if they wish to father a child. This medicine may lower sperm counts. Talk to your health care professional or pharmacist for more information. What side effects may I notice from receiving this  medicine? Side effects that you should report to your doctor or health care professional as soon as possible: -allergic reactions like skin rash, itching or hives, swelling of the face, lips, or tongue -bone pain -breathing problems -changes in vision -chest pain -feeling faint or lightheaded, falls -fever, chills -pain, swelling, warmth in the leg -pain, tingling, numbness in the hands or feet -signs and symptoms of low blood pressure like dizziness; feeling faint or lightheaded, falls; unusually weak or tired -stomach pain -swelling of the ankles, feet, hands -trouble passing urine or change in the amount of urine -unusually high or low blood pressure -unusually weak or tired Side effects that usually do not require medical attention (report to your doctor or health care professional if they continue or are bothersome): -change in sex drive or performance -changes in breast size in both males and females -changes in emotions or moods -headache -hot flashes -irritation at site where injected -loss of appetite -skin problems like acne, dry skin -vaginal dryness This list may not describe all possible side effects. Call your doctor for medical advice about side effects. You may report side effects to FDA at 1-800-FDA-1088. Where should I keep my medicine? This drug is given in a hospital or clinic and will not be stored at home. NOTE: This sheet is a summary. It may not cover all possible information. If you have questions about this medicine, talk to your doctor, pharmacist, or health care provider.  2019 Elsevier/Gold Standard (2013-11-18 11:10:35)  

## 2018-11-11 ENCOUNTER — Other Ambulatory Visit: Payer: Self-pay | Admitting: Oncology

## 2018-11-12 ENCOUNTER — Ambulatory Visit (HOSPITAL_COMMUNITY)
Admission: RE | Admit: 2018-11-12 | Discharge: 2018-11-12 | Disposition: A | Discharge status: Home / Self Care | Payer: BLUE CROSS/BLUE SHIELD | Source: Ambulatory Visit | Attending: Oncology | Admitting: Oncology

## 2018-11-12 ENCOUNTER — Encounter (HOSPITAL_BASED_OUTPATIENT_CLINIC_OR_DEPARTMENT_OTHER)
Admission: RE | Admit: 2018-11-12 | Discharge: 2018-11-12 | Disposition: A | Discharge status: Home / Self Care | Payer: BLUE CROSS/BLUE SHIELD | Source: Ambulatory Visit | Attending: General Surgery | Admitting: General Surgery

## 2018-11-12 ENCOUNTER — Telehealth: Payer: Self-pay

## 2018-11-12 DIAGNOSIS — C50511 Malignant neoplasm of lower-outer quadrant of right female breast: Secondary | ICD-10-CM

## 2018-11-12 DIAGNOSIS — Z171 Estrogen receptor negative status [ER-]: Secondary | ICD-10-CM | POA: Diagnosis present

## 2018-11-12 DIAGNOSIS — Z01812 Encounter for preprocedural laboratory examination: Secondary | ICD-10-CM | POA: Insufficient documentation

## 2018-11-12 LAB — POCT PREGNANCY, URINE: Preg Test, Ur: NEGATIVE

## 2018-11-12 MED ORDER — GADOBUTROL 1 MMOL/ML IV SOLN
7.0000 mL | Freq: Once | INTRAVENOUS | Status: AC | PRN
  Administered 2018-11-12: 7 mL via INTRAVENOUS

## 2018-11-12 MED ORDER — ENSURE PRE-SURGERY PO LIQD: 296.0000 mL | Freq: Once | ORAL | Status: DC

## 2018-11-12 NOTE — Progress Notes (Signed)
Ensure pre surgery drink given with instructions to complete by 0800 dos, pt verbalized understanding. 

## 2018-11-12 NOTE — Telephone Encounter (Signed)
Introduced myself to patient as a Therapist, sports from [employer]. Explained to her that [name] referred her to me for a clinical trial called UPBEAT. Briefly explained to her the purpose of UPBEAT. Pt confirmed that it was okay for me to talk with her briefly tomorrow after her chemotherapy education class. Thanked her for her time. Johney Maine RN, BSN Clinical Research  11/12/18 10:45 AM

## 2018-11-13 ENCOUNTER — Ambulatory Visit (HOSPITAL_COMMUNITY)
Admission: RE | Admit: 2018-11-13 | Discharge: 2018-11-13 | Disposition: A | Discharge status: Home / Self Care | Payer: BLUE CROSS/BLUE SHIELD | Source: Ambulatory Visit | Attending: Oncology | Admitting: Oncology

## 2018-11-13 ENCOUNTER — Inpatient Hospital Stay: Payer: BLUE CROSS/BLUE SHIELD

## 2018-11-13 ENCOUNTER — Encounter: Payer: Self-pay | Admitting: Oncology

## 2018-11-13 DIAGNOSIS — E785 Hyperlipidemia, unspecified: Secondary | ICD-10-CM | POA: Insufficient documentation

## 2018-11-13 DIAGNOSIS — C50511 Malignant neoplasm of lower-outer quadrant of right female breast: Secondary | ICD-10-CM

## 2018-11-13 DIAGNOSIS — Z171 Estrogen receptor negative status [ER-]: Secondary | ICD-10-CM | POA: Insufficient documentation

## 2018-11-13 NOTE — Research (Signed)
[facility] [id number]  Met with unaccompanied pt for approximately 10 minutes. Briefly explained clinical trials at the [facility] and the purpose of UPBEAT clinical trial. Acknowledged that patient is very busy and there is a tight window to complete study assessments before she starts chemotherapy. Provided patient with a clinical trials brochure, UPBEAT brochure, UPBEAT informed consent and hipaa form, and this RN's business card for her to review. Pt confirmed it was okay for this RN to follow-up with her Friday 11/15/18 at her appointment with [name]. Thanked her for her time and patience. Johney Maine RN, BSN Clinical Research  11/13/18 9:54 AM

## 2018-11-13 NOTE — Progress Notes (Signed)
Calledpt tointroduce myself as her Arboriculturist and to discuss copay assistance.  Pt gave me consent to apply in her behalf so I enrolled her in the [place] Completeprogramfor [place] $15,000 for 12 monthsfrom 11/13/18.Pt will pay $0 for her first treatment and $0 for each subsequent treatment (up to $7,200 per treatment).  I also informed her of the [employer], went over what it covers and gave her the income requirement.  If she would like to apply so she will bring proof of income on 11/15/18.  If approved I will give her an expense sheet as well as my card for anyquestions or concernsshe may havein the future.

## 2018-11-13 NOTE — Progress Notes (Signed)
  Echocardiogram 2D Echocardiogram has been performed.  Jannett Celestine 11/13/2018, 10:54 AM

## 2018-11-14 ENCOUNTER — Encounter (HOSPITAL_BASED_OUTPATIENT_CLINIC_OR_DEPARTMENT_OTHER)
Admission: RE | Disposition: A | Discharge status: Home / Self Care | Payer: Self-pay | Source: Home / Self Care | Attending: General Surgery

## 2018-11-14 ENCOUNTER — Ambulatory Visit (HOSPITAL_COMMUNITY): Payer: BLUE CROSS/BLUE SHIELD

## 2018-11-14 ENCOUNTER — Encounter (HOSPITAL_BASED_OUTPATIENT_CLINIC_OR_DEPARTMENT_OTHER): Payer: Self-pay | Admitting: Certified Registered Nurse Anesthetist

## 2018-11-14 ENCOUNTER — Ambulatory Visit (HOSPITAL_BASED_OUTPATIENT_CLINIC_OR_DEPARTMENT_OTHER): Payer: BLUE CROSS/BLUE SHIELD | Admitting: Certified Registered Nurse Anesthetist

## 2018-11-14 ENCOUNTER — Ambulatory Visit (HOSPITAL_BASED_OUTPATIENT_CLINIC_OR_DEPARTMENT_OTHER)
Admission: RE | Admit: 2018-11-14 | Discharge: 2018-11-14 | Disposition: A | Discharge status: Home / Self Care | Payer: BLUE CROSS/BLUE SHIELD | Attending: General Surgery | Admitting: General Surgery

## 2018-11-14 ENCOUNTER — Other Ambulatory Visit: Payer: Self-pay

## 2018-11-14 DIAGNOSIS — C50511 Malignant neoplasm of lower-outer quadrant of right female breast: Secondary | ICD-10-CM | POA: Diagnosis not present

## 2018-11-14 DIAGNOSIS — Z803 Family history of malignant neoplasm of breast: Secondary | ICD-10-CM | POA: Insufficient documentation

## 2018-11-14 DIAGNOSIS — Z95828 Presence of other vascular implants and grafts: Secondary | ICD-10-CM

## 2018-11-14 DIAGNOSIS — Z8673 Personal history of transient ischemic attack (TIA), and cerebral infarction without residual deficits: Secondary | ICD-10-CM | POA: Diagnosis not present

## 2018-11-14 DIAGNOSIS — C50911 Malignant neoplasm of unspecified site of right female breast: Secondary | ICD-10-CM | POA: Diagnosis present

## 2018-11-14 HISTORY — PX: PORTACATH PLACEMENT: SHX2246

## 2018-11-14 LAB — POCT PREGNANCY, URINE: Preg Test, Ur: NEGATIVE

## 2018-11-14 SURGERY — INSERTION, TUNNELED CENTRAL VENOUS DEVICE, WITH PORT
Anesthesia: General | Site: Chest | Laterality: Right

## 2018-11-14 MED ORDER — MIDAZOLAM HCL 2 MG/2ML IJ SOLN
INTRAMUSCULAR | Status: AC
  Filled 2018-11-14: qty 2

## 2018-11-14 MED ORDER — ACETAMINOPHEN 500 MG PO TABS
1000.0000 mg | ORAL_TABLET | ORAL | Status: AC
  Administered 2018-11-14: 1000 mg via ORAL

## 2018-11-14 MED ORDER — MIDAZOLAM HCL 2 MG/2ML IJ SOLN: 1.0000 mg | INTRAMUSCULAR | Status: DC | PRN

## 2018-11-14 MED ORDER — BUPIVACAINE HCL (PF) 0.25 % IJ SOLN
INTRAMUSCULAR | Status: DC | PRN
  Administered 2018-11-14: 5 mL

## 2018-11-14 MED ORDER — GABAPENTIN 100 MG PO CAPS
ORAL_CAPSULE | ORAL | Status: AC
  Filled 2018-11-14: qty 1

## 2018-11-14 MED ORDER — ONDANSETRON HCL 4 MG/2ML IJ SOLN
INTRAMUSCULAR | Status: DC | PRN
  Administered 2018-11-14: 4 mg via INTRAVENOUS

## 2018-11-14 MED ORDER — MIDAZOLAM HCL 5 MG/5ML IJ SOLN
INTRAMUSCULAR | Status: DC | PRN
  Administered 2018-11-14: 2 mg via INTRAVENOUS

## 2018-11-14 MED ORDER — ACETAMINOPHEN 500 MG PO TABS
ORAL_TABLET | ORAL | Status: AC
  Filled 2018-11-14: qty 2

## 2018-11-14 MED ORDER — LIDOCAINE 2% (20 MG/ML) 5 ML SYRINGE
INTRAMUSCULAR | Status: DC | PRN
  Administered 2018-11-14: 80 mg via INTRAVENOUS

## 2018-11-14 MED ORDER — FENTANYL CITRATE (PF) 100 MCG/2ML IJ SOLN
INTRAMUSCULAR | Status: DC | PRN
  Administered 2018-11-14: 25 ug via INTRAVENOUS
  Administered 2018-11-14: 50 ug via INTRAVENOUS
  Administered 2018-11-14: 25 ug via INTRAVENOUS

## 2018-11-14 MED ORDER — TRAMADOL HCL 50 MG PO TABS: 100.0000 mg | ORAL_TABLET | Freq: Four times a day (QID) | ORAL | 0 refills | Status: DC | PRN

## 2018-11-14 MED ORDER — HEPARIN SOD (PORK) LOCK FLUSH 100 UNIT/ML IV SOLN
INTRAVENOUS | Status: DC | PRN
  Administered 2018-11-14: 400 [IU] via INTRAVENOUS

## 2018-11-14 MED ORDER — SCOPOLAMINE 1 MG/3DAYS TD PT72: 1.0000 | MEDICATED_PATCH | Freq: Once | TRANSDERMAL | Status: DC | PRN

## 2018-11-14 MED ORDER — CEFAZOLIN SODIUM-DEXTROSE 2-4 GM/100ML-% IV SOLN
INTRAVENOUS | Status: AC
  Filled 2018-11-14: qty 100

## 2018-11-14 MED ORDER — FENTANYL CITRATE (PF) 100 MCG/2ML IJ SOLN: 50.0000 ug | INTRAMUSCULAR | Status: DC | PRN

## 2018-11-14 MED ORDER — GABAPENTIN 100 MG PO CAPS
100.0000 mg | ORAL_CAPSULE | ORAL | Status: AC
  Administered 2018-11-14: 100 mg via ORAL

## 2018-11-14 MED ORDER — LACTATED RINGERS IV SOLN
INTRAVENOUS | Status: DC
  Administered 2018-11-14: 11:00:00 via INTRAVENOUS

## 2018-11-14 MED ORDER — CEFAZOLIN SODIUM-DEXTROSE 2-4 GM/100ML-% IV SOLN
2.0000 g | INTRAVENOUS | Status: AC
  Administered 2018-11-14: 2 g via INTRAVENOUS

## 2018-11-14 MED ORDER — FENTANYL CITRATE (PF) 100 MCG/2ML IJ SOLN
INTRAMUSCULAR | Status: AC
  Filled 2018-11-14: qty 2

## 2018-11-14 MED ORDER — DEXAMETHASONE SODIUM PHOSPHATE 10 MG/ML IJ SOLN
INTRAMUSCULAR | Status: AC
  Filled 2018-11-14: qty 1

## 2018-11-14 MED ORDER — DEXAMETHASONE SODIUM PHOSPHATE 10 MG/ML IJ SOLN
INTRAMUSCULAR | Status: DC | PRN
  Administered 2018-11-14: 10 mg via INTRAVENOUS

## 2018-11-14 MED ORDER — PROPOFOL 10 MG/ML IV BOLUS
INTRAVENOUS | Status: DC | PRN
  Administered 2018-11-14: 170 mg via INTRAVENOUS

## 2018-11-14 MED ORDER — HEPARIN (PORCINE) IN NACL 2-0.9 UNITS/ML
INTRAMUSCULAR | Status: AC | PRN
  Administered 2018-11-14: 1 via INTRAVENOUS

## 2018-11-14 MED ORDER — FENTANYL CITRATE (PF) 100 MCG/2ML IJ SOLN: 25.0000 ug | INTRAMUSCULAR | Status: DC | PRN

## 2018-11-14 MED ORDER — ONDANSETRON HCL 4 MG/2ML IJ SOLN
INTRAMUSCULAR | Status: AC
  Filled 2018-11-14: qty 2

## 2018-11-14 SURGICAL SUPPLY — 57 items
ADH SKN CLS APL DERMABOND .7 (GAUZE/BANDAGES/DRESSINGS) ×1
APL SKNCLS STERI-STRIP NONHPOA (GAUZE/BANDAGES/DRESSINGS)
BAG DECANTER FOR FLEXI CONT (MISCELLANEOUS) ×3 IMPLANT
BENZOIN TINCTURE PRP APPL 2/3 (GAUZE/BANDAGES/DRESSINGS) ×1 IMPLANT
BLADE SURG 11 STRL SS (BLADE) ×3 IMPLANT
BLADE SURG 15 STRL LF DISP TIS (BLADE) ×1 IMPLANT
BLADE SURG 15 STRL SS (BLADE) ×3
CANISTER SUCT 1200ML W/VALVE (MISCELLANEOUS) IMPLANT
CHLORAPREP W/TINT 26ML (MISCELLANEOUS) ×3 IMPLANT
CLOSURE WOUND 1/2 X4 (GAUZE/BANDAGES/DRESSINGS) ×1
COVER BACK TABLE 60X90IN (DRAPES) ×3 IMPLANT
COVER MAYO STAND STRL (DRAPES) ×3 IMPLANT
COVER PROBE 5X48 (MISCELLANEOUS) ×3
COVER WAND RF STERILE (DRAPES) IMPLANT
DECANTER SPIKE VIAL GLASS SM (MISCELLANEOUS) IMPLANT
DERMABOND ADVANCED (GAUZE/BANDAGES/DRESSINGS) ×2
DERMABOND ADVANCED .7 DNX12 (GAUZE/BANDAGES/DRESSINGS) ×1 IMPLANT
DRAPE C-ARM 42X72 X-RAY (DRAPES) ×3 IMPLANT
DRAPE LAPAROSCOPIC ABDOMINAL (DRAPES) ×3 IMPLANT
DRAPE UTILITY XL STRL (DRAPES) ×3 IMPLANT
DRSG TEGADERM 4X4.75 (GAUZE/BANDAGES/DRESSINGS) IMPLANT
ELECT COATED BLADE 2.86 ST (ELECTRODE) ×3 IMPLANT
ELECT REM PT RETURN 9FT ADLT (ELECTROSURGICAL) ×3
ELECTRODE REM PT RTRN 9FT ADLT (ELECTROSURGICAL) ×1 IMPLANT
GAUZE SPONGE 4X4 12PLY STRL LF (GAUZE/BANDAGES/DRESSINGS) ×3 IMPLANT
GLOVE BIO SURGEON STRL SZ 6.5 (GLOVE) ×2 IMPLANT
GLOVE BIO SURGEON STRL SZ7 (GLOVE) ×3 IMPLANT
GLOVE BIO SURGEONS STRL SZ 6.5 (GLOVE) ×2
GLOVE BIOGEL PI IND STRL 6.5 (GLOVE) IMPLANT
GLOVE BIOGEL PI IND STRL 7.5 (GLOVE) ×1 IMPLANT
GLOVE BIOGEL PI INDICATOR 6.5 (GLOVE) ×4
GLOVE BIOGEL PI INDICATOR 7.5 (GLOVE) ×2
GOWN STRL REUS W/ TWL LRG LVL3 (GOWN DISPOSABLE) ×2 IMPLANT
GOWN STRL REUS W/TWL LRG LVL3 (GOWN DISPOSABLE) ×9
IV KIT MINILOC 20X1 SAFETY (NEEDLE) IMPLANT
KIT CVR 48X5XPRB PLUP LF (MISCELLANEOUS) IMPLANT
KIT PORT POWER 8FR ISP CVUE (Port) ×2 IMPLANT
NDL HYPO 25X1 1.5 SAFETY (NEEDLE) ×1 IMPLANT
NDL SAFETY ECLIPSE 18X1.5 (NEEDLE) IMPLANT
NEEDLE HYPO 18GX1.5 SHARP (NEEDLE)
NEEDLE HYPO 25X1 1.5 SAFETY (NEEDLE) ×3 IMPLANT
PACK BASIN DAY SURGERY FS (CUSTOM PROCEDURE TRAY) ×3 IMPLANT
PENCIL BUTTON HOLSTER BLD 10FT (ELECTRODE) ×3 IMPLANT
SLEEVE SCD COMPRESS KNEE MED (MISCELLANEOUS) ×3 IMPLANT
STRIP CLOSURE SKIN 1/2X4 (GAUZE/BANDAGES/DRESSINGS) ×2 IMPLANT
SUT MNCRL AB 4-0 PS2 18 (SUTURE) ×3 IMPLANT
SUT PROLENE 2 0 SH DA (SUTURE) ×3 IMPLANT
SUT SILK 2 0 TIES 17X18 (SUTURE)
SUT SILK 2-0 18XBRD TIE BLK (SUTURE) IMPLANT
SUT VIC AB 3-0 SH 27 (SUTURE) ×3
SUT VIC AB 3-0 SH 27X BRD (SUTURE) ×1 IMPLANT
SYR 5ML LUER SLIP (SYRINGE) ×3 IMPLANT
SYR CONTROL 10ML LL (SYRINGE) ×3 IMPLANT
TOWEL GREEN STERILE FF (TOWEL DISPOSABLE) ×3 IMPLANT
TUBE CONNECTING 20'X1/4 (TUBING)
TUBE CONNECTING 20X1/4 (TUBING) IMPLANT
YANKAUER SUCT BULB TIP NO VENT (SUCTIONS) IMPLANT

## 2018-11-14 NOTE — Discharge Instructions (Signed)
Post Anesthesia Home Care Instructions  Activity: Get plenty of rest for the remainder of the day. A responsible individual must stay with you for 24 hours following the procedure.  For the next 24 hours, DO NOT: -Drive a car -Operate machinery -Drink alcoholic beverages -Take any medication unless instructed by your physician -Make any legal decisions or sign important papers.  Meals: Start with liquid foods such as gelatin or soup. Progress to regular foods as tolerated. Avoid greasy, spicy, heavy foods. If nausea and/or vomiting occur, drink only clear liquids until the nausea and/or vomiting subsides. Call your physician if vomiting continues.  Special Instructions/Symptoms: Your throat may feel dry or sore from the anesthesia or the breathing tube placed in your throat during surgery. If this causes discomfort, gargle with warm salt water. The discomfort should disappear within 24 hours.  If you had a scopolamine patch placed behind your ear for the management of post- operative nausea and/or vomiting:  1. The medication in the patch is effective for 72 hours, after which it should be removed.  Wrap patch in a tissue and discard in the trash. Wash hands thoroughly with soap and water. 2. You may remove the patch earlier than 72 hours if you experience unpleasant side effects which may include dry mouth, dizziness or visual disturbances. 3. Avoid touching the patch. Wash your hands with soap and water after contact with the patch.                 PORT-A-CATH: POST OP INSTRUCTIONS  Always review your discharge instruction sheet given to you by the facility where your surgery was performed.   1. A prescription for pain medication may be given to you upon discharge. Take your pain medication as prescribed, if needed. If narcotic pain medicine is not needed, then you make take acetaminophen (Tylenol) or ibuprofen (Advil) as needed.  2. Take your usually prescribed  medications unless otherwise directed. 3. If you need a refill on your pain medication, please contact our office. All narcotic pain medicine now requires a paper prescription.  Phoned in and fax refills are no longer allowed by law.  Prescriptions will not be filled after 5 pm or on weekends.  4. You should follow a light diet for the remainder of the day after your procedure. 5. Most patients will experience some mild swelling and/or bruising in the area of the incision. It may take several days to resolve. 6. It is common to experience some constipation if taking pain medication after surgery. Increasing fluid intake and taking a stool softener (such as Colace) will usually help or prevent this problem from occurring. A mild laxative (Milk of Magnesia or Miralax) should be taken according to package directions if there are no bowel movements after 48 hours.  7. Unless discharge instructions indicate otherwise, you may remove your bandages 48 hours after surgery, and you may shower at that time. You may have steri-strips (small white skin tapes) in place directly over the incision.  These strips should be left on the skin for 7-10 days.  If your surgeon used Dermabond (skin glue) on the incision, you may shower in 24 hours.  The glue will flake off over the next 2-3 weeks.  8. If your port is left accessed at the end of surgery (needle left in port), the dressing cannot get wet and should only by changed by a healthcare professional. When the port is no longer accessed (when the needle has been removed), follow step   7.   9. ACTIVITIES:  Limit activity involving your arms for the next 72 hours. Do no strenuous exercise or activity for 1 week. You may drive when you are no longer taking prescription pain medication, you can comfortably wear a seatbelt, and you can maneuver your car. 10.You may need to see your doctor in the office for a follow-up appointment.  Please       check with your doctor.  11.When  you receive a new Port-a-Cath, you will get a product guide and        ID card.  Please keep them in case you need them.  WHEN TO CALL YOUR DOCTOR ([phone]): 1. Fever over 101.0 2. Chills 3. Continued bleeding from incision 4. Increased redness and tenderness at the site 5. Shortness of breath, difficulty breathing   The clinic staff is available to answer your questions during regular business hours. Please don't hesitate to call and ask to speak to one of the nurses or medical assistants for clinical concerns. If you have a medical emergency, go to the nearest emergency room or call 911.  A surgeon from [facility] is always on call at the hospital.     For further information, please visit [email or web address]     

## 2018-11-14 NOTE — H&P (Signed)
48 yof referred by Dr Sarajane Jews who presents with new right breast cancer. she has extensive fh. she noted a right breast mass. no prior breast history. she on mm and [name] has a 3.6x2.9x2.3 cm mass at 6 oclock in the right breast. there was one prominent node that was biopsied. this was benign concordant. 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 she works at [employer], is a nonsmoker. is here with her husband today   Past Surgical History Tawni Pummel, RN; 11/06/2018 7:22 AM) Breast Biopsy  Right. Cesarean Section - 1  Oral Surgery   Diagnostic Studies History Tawni Pummel, RN; 11/06/2018 7:22 AM) Colonoscopy  5-10 years ago Mammogram  within last year Pap Smear  1-5 years ago  Medication History Tawni Pummel, RN; 11/06/2018 7:22 AM) Medications Reconciled  Social History Tawni Pummel, RN; 11/06/2018 7:22 AM) Alcohol use  Occasional alcohol use. No drug use  Tobacco use  Never smoker.  Family History Tawni Pummel, RN; 11/06/2018 7:22 AM) Breast Cancer  Family Members In General, Mother. Hypertension  Family Members In General, Mother, Sister. Thyroid problems  Sister.  Pregnancy / Birth History Tawni Pummel, RN; 11/06/2018 7:22 AM) Age at menarche  17 years. Gravida  3 Maternal age  33-20 Para  2 Regular periods   Other Problems Tawni Pummel, RN; 11/06/2018 7:22 AM) Bladder Problems  Cerebrovascular Accident  Gastroesophageal Reflux Disease  Hypercholesterolemia  Other disease, cancer, significant illness    Review of Systems Sunday Spillers Ledford RN; 11/06/2018 7:22 AM) General Present- Chills. Not Present- Appetite Loss, Fatigue, Fever, Night Sweats, Weight Gain and Weight Loss. Skin Present- Rash. Not Present- Change in Wart/Mole, Dryness, Hives, Jaundice, New Lesions, Non-Healing Wounds and Ulcer. Respiratory Present- Chronic Cough. Not Present- Bloody sputum, Difficulty Breathing, Snoring and Wheezing. Breast Not Present-  Breast Mass, Breast Pain, Nipple Discharge and Skin Changes. Gastrointestinal Present- Abdominal Pain. Not Present- Bloating, Bloody Stool, Change in Bowel Habits, Chronic diarrhea, Constipation, Difficulty Swallowing, Excessive gas, Gets full quickly at meals, Hemorrhoids, Indigestion, Nausea, Rectal Pain and Vomiting. Female Genitourinary Not Present- Frequency, Nocturia, Painful Urination, Pelvic Pain and Urgency. Neurological Present- Headaches. Not Present- Decreased Memory, Fainting, Numbness, Seizures, Tingling, Tremor, Trouble walking and Weakness.   Physical Exam Rolm Bookbinder MD; 11/06/2018 1:28 PM) General Mental Status-Alert. Head and Neck Trachea-midline. Thyroid Gland Characteristics - normal size and consistency. Eye Sclera/Conjunctiva - Bilateral-No scleral icterus. Chest and Lung Exam Chest and lung exam reveals -quiet, even and easy respiratory effort with no use of accessory muscles and on auscultation, normal breath sounds, no adventitious sounds and normal vocal resonance. Breast Nipples-No Discharge. Note: 4 cm lower pole mass, mobile Cardiovascular Cardiovascular examination reveals -normal heart sounds, regular rate and rhythm with no murmurs. Abdomen Note: soft no hepatomegaly Neurologic Neurologic evaluation reveals -alert and oriented x 3 with no impairment of recent or remote memory. Lymphatic Head & Neck General Head & Neck Lymphatics: Bilateral - Description - Normal. Axillary General Axillary Region: Bilateral - Description - Normal. Note: no [place] adenopathy   Assessment & Plan Rolm Bookbinder MD; 11/06/2018 1:32 PM) BREAST CANCER OF LOWER-OUTER QUADRANT OF RIGHT FEMALE BREAST (C50.511) Story: MRI, port placement, primary chemotherapy We discussed the staging and pathophysiology of breast cancer. We discussed all of the different options for treatment for breast cancer including surgery, chemotherapy, radiation therapy,  Herceptin, and antiestrogen therapy. we discussed indications for primary chemotherapy with tnbc. this is to try to downstage for lumpectomy if she desires as well as to tell  what rcb is to determine other therapy. discussed sn biopsy at time of surgery as well as lumpectomy vs mastectomy. will plan port placement in right ij prior to beginning chemotherapy

## 2018-11-14 NOTE — Anesthesia Preprocedure Evaluation (Addendum)
Anesthesia Evaluation  Patient identified by MRN, date of birth, ID band Patient awake    Reviewed: Allergy & Precautions, NPO status , Patient's Chart, lab work & pertinent test results  Airway Mallampati: II  TM Distance: >3 FB     Dental   Pulmonary neg pulmonary ROS,    breath sounds clear to auscultation       Cardiovascular negative cardio ROS   Rhythm:Regular Rate:Normal     Neuro/Psych    GI/Hepatic Neg liver ROS, GERD  ,  Endo/Other  negative endocrine ROS  Renal/GU negative Renal ROS     Musculoskeletal   Abdominal   Peds  Hematology   Anesthesia Other Findings   Reproductive/Obstetrics                             Anesthesia Physical Anesthesia Plan  ASA: II  Anesthesia Plan: General   Post-op Pain Management:    Induction: Intravenous  PONV Risk Score and Plan: 3 and Ondansetron, Dexamethasone and Midazolam  Airway Management Planned: LMA  Additional Equipment:   Intra-op Plan:   Post-operative Plan: Extubation in OR  Informed Consent: I have reviewed the patients History and Physical, chart, labs and discussed the procedure including the risks, benefits and alternatives for the proposed anesthesia with the patient or authorized representative who has indicated his/her understanding and acceptance.     Dental advisory given  Plan Discussed with: CRNA and Anesthesiologist  Anesthesia Plan Comments:         Anesthesia Quick Evaluation

## 2018-11-14 NOTE — Progress Notes (Signed)
[place]  [phone] [phone]    ID: Brittany Rodgers DOB: Feb 04, 1971  MR#: [id number]  AST#:[id number]  Patient Care Team: [name], MD as PCP - [name], MD as Consulting Physician (General Surgery) , [name], MD as Consulting Physician (Oncology) [name], MD as Attending Physician (Radiation Oncology) [name], MD as Consulting Physician (Gastroenterology) [name], MD as Consulting Physician (Obstetrics and Gynecology) OTHER MD: Dr. Siri Cole (urology)    CHIEF COMPLAINT: Triple negative breast cancer  CURRENT TREATMENT: Neoadjuvant chemotherapy   HISTORY OF CURRENT ILLNESS: Brittany Rodgers presented with a palpable right breast lump and associated tenderness for approximately 1 week. She called her doctor immediately following  She underwent unilateral right diagnostic mammography with tomography and right breast ultrasonography at [facility] on 10/28/2018 showing: Breast Density Category C. Radiopaque BB was placed at the site of the patient's palpable lump in the central right breast. An irregular hyperdense mass with associated increased trabecular thickening is seen in the deep central right breast. On physical exam, a firm, fixed mass in the central right breast is palpated. Target ultrasound is performed, showing and irregular hypoechoic mass at the 8 o'clock position 3 cm from the nipple. It measures 3.6 x 2.9 x 2.3 cm. There is associated peripheral vascularity. Evaluation of the right axilla demonstrates a single prominent lymph node demonstrating 0.4 cm cortical thickening. The remaining lymph nodes ave normal appearance with a thin, symmetric cortex.   Accordingly on 10/29/2018 she proceeded to biopsy of the right breast area in question. The pathology from this procedure showed ([id number]): invasive ductal carcinoma, grade III. Prognostic indicators significant for: estrogen  receptor, 0% negative and progesterone receptor, 0% negative. Proliferation marker Ki67 at 80%. HER2 negative (1+) by immunohistochemistry.  Biopsy of the right axilla was performed on the same day. The pathology from this procedure showed ([id number]): lymph node, negative for carcinoma.  This was felt to be concordant  The patient's subsequent history is as detailed below.   INTERVAL HISTORY: Brittany Rodgers returns today for follow-up and treatment of her triple negative breast cancer.   She will begin neoadjuvant chemotherapy consiting of doxorubicin and cyclophosphamide in dose dense fashion x4, starting 11/19/2018.  She continues with Goserelin, which she last received on 11/08/2018. She tolerates this well and without any noticeable side effects. Her last period was the 11/08/2018 through 11/12/2018.  Since her last visit here, she underwent a bilateral breast MRI on 11/12/2018 showing Breast Density Category B. 1. 5.4 x 4.6 x 3.9 cm biopsy-proven malignancy in the posterior aspect of the lower outer quadrant of the right breast. This is invading the anterior aspect of the underlying pectoralis muscle and has a significant necrotic component. Additional non mass enhancement involving all 4 quadrants of the right breast, extending from the anterior to the posterior aspect of the breast. The non mass enhancement and biopsy-proven malignancy involve a combined area measuring 9.6 x 6.8 x 4.9 cm. The non mass enhancement is concerning four additional ductal carcinoma in situ. Single abnormal appearing right axillary lymph node, corresponding to the recently biopsied lymph node with benign results. Despite the recent benign biopsy results, this is suspicious for a possible metastatic node. No evidence of malignancy the on the left.  In addition, she also underwent an echocardiogram on 11/13/2018 showing an ejection fraction in the 60% - 65% range.   She also underwent a port insertion on 11/14/2018. She  beleives that this went well. She  put ice on it last night and took tramadol for the pain. Today, the pain is more manageable.   She underwent a chest CT on 11/15/2018 showing Posterior right breast 5.6 cm mass compatible with primary right breast malignancy. Right axillary lymphadenopathy compatible with nodal metastatic Disease. Two poorly marginated hypodense liver masses, indeterminate, suspicious for liver metastases. Further staging evaluation with CT abdomen/pelvis with oral and IV contrast suggested. PET-CT or MRI abdomen without and with IV contrast may also be considered, as clinically warranted. No pulmonary nodules.  Finally, she also underwent whole body bone scan on 11/15/2018. Results for this study were still pending at the time of appointment.    REVIEW OF SYSTEMS: Brittany Rodgers is working on getting her FMLA leave of absence paperwork completed. She stated that she will still have her job once she returns. She has had some allergies recently, which she has been taking Zyrtec for. The teaching nurse for chemo recommended her to switch to Claritin instead. She is getting over a viral cold. She has been trying to drink a lot of water, but she had to fast twice, which has made it a little more difficult. The patient denies unusual headaches, visual changes, nausea, vomiting, or dizziness. There has been no unusual cough, phlegm production, or pleurisy. This been no change in bowel or bladder habits. The patient denies unexplained fatigue or unexplained weight loss, bleeding, rash, or fever. A detailed review of systems was otherwise noncontributory.    PAST MEDICAL HISTORY: Past Medical History:  Diagnosis Date  . Chronic cough   . GERD (gastroesophageal reflux disease)   . Hyperlipidemia   . IBS (irritable bowel syndrome)    sees Dr. Silvano Rusk   . Routine gynecological examination    sees Dr. Freda Munro     PAST SURGICAL HISTORY: Past Surgical History:  Procedure Laterality  Date  . CESAREAN SECTION    . ROOT CANAL       FAMILY HISTORY: Family History  Problem Relation Age of Onset  . Hypertension Other   . Breast cancer Other   . Breast cancer Maternal Aunt    Brittany Rodgers's father died from an unknown cause at age 56. [name] died from unknown health complications at age 34. The patient has 4 sisters. Brittany Rodgers has 3 maternal aunts that have been diagnosed with breast cancer. Patient denies anyone in her family having ovarian, prostate, or pancreatic cancer.    GYNECOLOGIC HISTORY:  No LMP recorded. 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 Her husband, Jessyka Austria, is a Administrator. The patient has two children, [name] and [name], from another relationship. [name] is 43, lives in [place]. [name] is 68, lives in [place], and is in [employer].  The patient's husband [name] has a child, [name], from another relationship; she lives in [place] and is in school. The patient has 5 grandchildren. She attends the [employer].    ADVANCED DIRECTIVES: Brittany Rodgers's husband, [name], is automatically her healthcare power of attorney.     HEALTH MAINTENANCE: Social History   Tobacco Use  . Smoking status: Never Smoker  . Smokeless tobacco: Never Used  Substance Use Topics  . Alcohol use: No    Alcohol/week: 0.0 standard drinks  . Drug use: No    Colonoscopy: yes  PAP: 02/2018  Bone density: no  Allergies  Allergen Reactions  . Barbiturates Other (See Comments)    Keeps pt awake  . Sulfonamide Derivatives Itching    No current facility-administered medications for this visit.    Current Outpatient Medications  Medication Sig Dispense Refill  . traMADol (ULTRAM) 50 MG tablet Take 2 tablets (100 mg total) by mouth every 6 (six) hours as needed for moderate pain. 10 tablet 0    Facility-Administered Medications Ordered in Other Visits  Medication Dose Route Frequency Provider Last Rate Last Dose  . feeding supplement (ENSURE PRE-SURGERY) liquid 296 mL  296 mL Oral Once Rolm Bookbinder, MD      . fentaNYL (SUBLIMAZE) injection 25-50 mcg  25-50 mcg Intravenous Q5 min PRN Belinda Block, MD      . fentaNYL (SUBLIMAZE) injection 50-100 mcg  50-100 mcg Intravenous PRN Lynda Rainwater, MD      . lactated ringers infusion   Intravenous Continuous Lynda Rainwater, MD 10 mL/hr at 11/14/18 1048    . midazolam (VERSED) injection 1-2 mg  1-2 mg Intravenous PRN Lynda Rainwater, MD      . scopolamine (TRANSDERM-SCOP) 1 MG/3DAYS 1.5 mg  1 patch Transdermal Once PRN Lynda Rainwater, MD         OBJECTIVE: Young African-American woman who appears stated age  There were no vitals filed for this visit.   There is no height or weight on file to calculate BMI.   Wt Readings from Last 3 Encounters:  11/14/18 162 lb 0.6 oz (73.5 kg)  11/06/18 157 lb 14.4 oz (71.6 kg)  11/01/18 160 lb 6 oz (72.7 kg)      ECOG FS:0 - Asymptomatic  Sclerae unicteric, EOMs intact Oropharynx clear and moist No cervical or supraclavicular adenopathy Lungs no rales or rhonchi Heart regular rate and rhythm Abd soft, nontender, positive bowel sounds MSK no focal spinal tenderness, no upper extremity lymphedema Neuro: nonfocal, well oriented, appropriate affect Breasts: Deferred Port is intact, easily palpable   LAB RESULTS:  CMP     Component Value Date/Time   NA 140 11/06/2018 0821   K 4.1 11/06/2018 0821   CL 103 11/06/2018 0821   CO2 28 11/06/2018 0821   GLUCOSE 93 11/06/2018 0821   BUN 11 11/06/2018 0821   CREATININE 0.86 11/06/2018 0821   CALCIUM 9.3 11/06/2018 0821   PROT 8.0 11/06/2018 0821   ALBUMIN 4.4 11/06/2018 0821   AST 11 (L) 11/06/2018 0821   ALT 25 11/06/2018 0821   ALKPHOS 100 11/06/2018 0821   BILITOT 0.5 11/06/2018 0821   GFRNONAA >60 11/06/2018  0821   GFRAA >60 11/06/2018 0821    No results found for: TOTALPROTELP, ALBUMINELP, A1GS, A2GS, BETS, BETA2SER, GAMS, MSPIKE, SPEI  No results found for: KPAFRELGTCHN, LAMBDASER, [facility]  Lab Results  Component Value Date   WBC 4.3 11/06/2018   NEUTROABS 2.4 11/06/2018   HGB 13.4 11/06/2018   HCT 41.5 11/06/2018   MCV 88.7 11/06/2018   PLT 392 11/06/2018    @LASTCHEMISTRY @  No results found for: LABCA2  No components found for: [id number]  No results for input(s): INR in the last 168 hours.  No results found for: LABCA2  No results found for: UQJ335  No results found for: KTG256  No results found for: LSL373  No results found for: CA2729  No components found for: HGQUANT  No results found for: CEA1 / No results found for: CEA1   No results found for: AFPTUMOR  No results found for: [facility]  No results found for: PSA1  Admission on 11/14/2018  Component Date Value Ref Range Status  . Preg Test, Ur 11/14/2018 NEGATIVE  NEGATIVE Final   Comment:        THE SENSITIVITY OF THIS METHODOLOGY IS >24 mIU/mL     (this displays the last labs from the last 3 days)  No results found for: TOTALPROTELP, ALBUMINELP, A1GS, A2GS, BETS, BETA2SER, GAMS, MSPIKE, SPEI (this displays SPEP labs)  No results found for: KPAFRELGTCHN, LAMBDASER, KAPLAMBRATIO (kappa/lambda light chains)  No results found for: HGBA, HGBA2QUANT, HGBFQUANT, HGBSQUAN (Hemoglobinopathy evaluation)   No results found for: LDH  No results found for: IRON, TIBC, IRONPCTSAT (Iron and TIBC)  No results found for: FERRITIN  Urinalysis    Component Value Date/Time   COLORURINE YELLOW 05/18/2010 1042   APPEARANCEUR CLOUDY (A) 05/18/2010 1042   LABSPEC 1.028 05/18/2010 1042   PHURINE 5.5 05/18/2010 1042   GLUCOSEU NEGATIVE 05/18/2010 1042   HGBUR NEGATIVE 05/18/2010 1042   BILIRUBINUR negative 05/31/2018 1011   KETONESUR NEGATIVE 05/18/2010 1042   PROTEINUR Positive (A) 05/31/2018 1011    PROTEINUR NEGATIVE 05/18/2010 1042   UROBILINOGEN 0.2 05/31/2018 1011   UROBILINOGEN 0.2 05/18/2010 1042   NITRITE NEGATIVE 05/31/2018 1011   NITRITE NEGATIVE 05/18/2010 1042   LEUKOCYTESUR Negative 05/31/2018 1011     STUDIES:  Mr Breast Bilateral W Wo Contrast Inc Cad  Result Date: 11/12/2018 CLINICAL DATA:  Recently diagnosed grade 3 invasive ductal carcinoma and high-grade ductal carcinoma in situ in the 8 o'clock position of the right breast. A borderline abnormal right axillary lymph node was biopsied with no evidence of malignancy. LABS:  None obtained on site today. EXAM: BILATERAL BREAST MRI WITH AND WITHOUT CONTRAST TECHNIQUE: Multiplanar, multisequence MR images of both breasts were obtained prior to and following the intravenous administration of 7 ml of Gadavist Three-dimensional MR images were rendered by post-processing of the original MR data on an independent workstation. The three-dimensional MR images were interpreted, and findings are reported in the following complete MRI report for this study. Three dimensional images were evaluated at the independent DynaCad workstation COMPARISON:  Current and previous mammogram, ultrasound and biopsy examinations. FINDINGS: Breast composition: b. Scattered fibroglandular tissue. Background parenchymal enhancement: Moderate. Right breast: 5.4 x 4.6 x 3.9 cm oval mass with a mildly to moderately thickened rind of peripheral enhancing soft tissue and larger, more confluent irregular enhancing soft tissue component posteriorly in the lower outer quadrant of the right breast. This corresponds to the recently biopsied malignancy. This is abutting the underlying pectoralis muscle and extending into the anterior aspect of the muscle with abnormal enhancement. The mass has a mixture of enhancement kinetics, including a small amount of rapid wash-in/washout kinetics and larger amount of persistent and plateau kinetics. This masses within a large area of  non mass enhancement involving the lower outer quadrant and lower inner quadrant of the right breast and extending from the far anterior aspect of the breast to the far posterior aspect of the breast. This also has some extension into the upper outer and upper inner quadrants and, with the biopsied mass, involve an area measuring 9.6 x 6.8 x 4.9 cm. This has predominantly persistent enhancement kinetics with a small amount of plateau kinetics. Left breast: No mass or abnormal enhancement. Lymph nodes: There is 1 abnormally enlarged right axillary lymph node with abnormal cortical thickening, suspicious for a metastatic node. This corresponds to the recently biopsied node with benign results and contains a biopsy marker artifact.  There is a prominent internal mammary vein on the right with no enlarged internal mammary lymph nodes seen. Ancillary findings:  None. IMPRESSION: 1. 5.4 x 4.6 x 3.9 cm biopsy-proven malignancy in the posterior aspect of the lower outer quadrant of the right breast. This is invading the anterior aspect of the underlying pectoralis muscle and has a significant necrotic component. 2. Additional non mass enhancement involving all 4 quadrants of the right breast, extending from the anterior to the posterior aspect of the breast. The non mass enhancement and biopsy-proven malignancy involve a combined area measuring 9.6 x 6.8 x 4.9 cm. The non mass enhancement is concerning four additional ductal carcinoma in situ. 3. Single abnormal appearing right axillary lymph node, corresponding to the recently biopsied lymph node with benign results. Despite the recent benign biopsy results, this is suspicious for a possible metastatic node. 4. No evidence of malignancy the on the left. RECOMMENDATION: 6: Treatment plan. BI-RADS CATEGORY  6: Known biopsy-proven malignancy. Electronically Signed   By: Claudie Revering M.D.   On: 11/12/2018 16:06   Dg Chest Port 1 View  Result Date: 11/14/2018 CLINICAL DATA:   Port-A-Cath placement. EXAM: PORTABLE CHEST 1 VIEW COMPARISON:  06/09/2008 FINDINGS: Right IJ approach central venous catheter terminates in the expected location of the cavoatrial junction. Cardiomediastinal silhouette is normal. Mediastinal contours appear intact. There is no evidence of focal airspace consolidation, pleural effusion or pneumothorax. Osseous structures are without acute abnormality. Soft tissues are grossly normal. IMPRESSION: Right IJ approach central venous catheter terminates in the expected location of the cavoatrial junction. No evidence of pneumothorax. Electronically Signed   By: Fidela Salisbury M.D.   On: 11/14/2018 12:39   Dg Fluoro Guide Cv Line-no Report  Result Date: 11/14/2018 Fluoroscopy was utilized by the requesting physician.  No radiographic interpretation.   US Breast Ltd Uni Right Inc Axilla  Result Date: 10/28/2018 CLINICAL DATA:  48 year old female with a palpable right breast lump and associated tenderness for approximately 1 week. EXAM: DIGITAL DIAGNOSTIC RIGHT MAMMOGRAM WITH CAD AND TOMO ULTRASOUND RIGHT BREAST COMPARISON:  Previous exam(s). ACR Breast Density Category c: The breast tissue is heterogeneously dense, which may obscure small masses. FINDINGS: Radiopaque BB was placed at the site of the patient's palpable lump in the central right breast. An irregular hyperdense mass with associated increased trabecular thickening and is seen in the deep central right breast. Further evaluation with ultrasound was performed. Mammographic images were processed with CAD. On physical exam, I palpate a firm, fixed mass in the central right breast. Targeted ultrasound is performed, showing an irregular hypoechoic mass at the 8 o'clock position 3 cm from the nipple. It measures 3.6 x 2.9 x 2.3 cm. There is associated peripheral vascularity. Evaluation of the right axilla demonstrates a single prominent lymph node demonstrating 4 mm cortical thickening. The remaining lymph  nodes have a normal appearance with a thin, symmetric cortex. IMPRESSION: 1. Suspicious right breast mass corresponding with the patient's palpable lump. 2. Indeterminate right axillary lymph node demonstrating up to 4 mm cortical thickening. RECOMMENDATION: Two area ultrasound-guided biopsy of the right breast and right axilla. If findings demonstrate malignancy, further evaluation with bilateral contrast-enhanced breast MRI is recommended to evaluate extent of disease. I have discussed the findings and recommendations with the patient. Results were also provided in writing at the conclusion of the visit. If applicable, a reminder letter will be sent to the patient regarding the next appointment. BI-RADS CATEGORY  4: Suspicious. Electronically Signed   By:  Kristopher Oppenheim M.D.   On: 10/28/2018 13:30   Mm Diag Breast Tomo Uni Right  Result Date: 10/28/2018 CLINICAL DATA:  48 year old female with a palpable right breast lump and associated tenderness for approximately 1 week. EXAM: DIGITAL DIAGNOSTIC RIGHT MAMMOGRAM WITH CAD AND TOMO ULTRASOUND RIGHT BREAST COMPARISON:  Previous exam(s). ACR Breast Density Category c: The breast tissue is heterogeneously dense, which may obscure small masses. FINDINGS: Radiopaque BB was placed at the site of the patient's palpable lump in the central right breast. An irregular hyperdense mass with associated increased trabecular thickening and is seen in the deep central right breast. Further evaluation with ultrasound was performed. Mammographic images were processed with CAD. On physical exam, I palpate a firm, fixed mass in the central right breast. Targeted ultrasound is performed, showing an irregular hypoechoic mass at the 8 o'clock position 3 cm from the nipple. It measures 3.6 x 2.9 x 2.3 cm. There is associated peripheral vascularity. Evaluation of the right axilla demonstrates a single prominent lymph node demonstrating 4 mm cortical thickening. The remaining lymph nodes  have a normal appearance with a thin, symmetric cortex. IMPRESSION: 1. Suspicious right breast mass corresponding with the patient's palpable lump. 2. Indeterminate right axillary lymph node demonstrating up to 4 mm cortical thickening. RECOMMENDATION: Two area ultrasound-guided biopsy of the right breast and right axilla. If findings demonstrate malignancy, further evaluation with bilateral contrast-enhanced breast MRI is recommended to evaluate extent of disease. I have discussed the findings and recommendations with the patient. Results were also provided in writing at the conclusion of the visit. If applicable, a reminder letter will be sent to the patient regarding the next appointment. BI-RADS CATEGORY  4: Suspicious. Electronically Signed   By: Kristopher Oppenheim M.D.   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 LYMPH NODE, NEGATIVE FOR CARCINOMA of the Right axilla. This was found to be concordant by Dr. Lovey Newcomer. Pathology results were discussed with the patient by telephone. The patient reported doing well after the biopsies with tenderness at the sites. Post biopsy instructions and care were reviewed and questions were answered. The patient was encouraged to call [facility] for any additional concerns. The patient was referred to [facility] on November 06, 2018. Recommendation for a bilateral breast MRI due to family history and heterogeneously dense breasts. Pathology results reported by Terie Purser, RN on 10/31/2018. Electronically Signed   By: Lovey Newcomer M.D.   On: 10/31/2018 09:00   Result Date: 10/31/2018 CLINICAL DATA:  Cortically thickened right axillary lymph node. EXAM: [name] AXILLARY  NODE CORE BIOPSY RIGHT COMPARISON:  Previous exam(s). FINDINGS: I met with the patient and we discussed the procedure of ultrasound-guided biopsy, including benefits and alternatives. We discussed the high likelihood of a successful procedure. We discussed the risks of the procedure, including infection, bleeding, tissue injury, clip migration, and inadequate sampling. Informed written consent was given. The usual time-out protocol was performed immediately prior to the procedure. Using sterile technique and 1% Lidocaine as local anesthetic, under direct ultrasound visualization, a 14 gauge spring-loaded device was used to perform biopsy of cortically thickened right axillary lymph node using a lateral approach. At the conclusion of the procedure a HydroMARK tissue marker  clip was deployed into the biopsy cavity. Follow up 2 view mammogram was performed and dictated separately. IMPRESSION: Ultrasound guided biopsy of right axillary lymph node. No apparent complications. Electronically Signed: By: Lovey Newcomer M.D. On: 10/29/2018 11:46   Mm Clip Placement Right  Result Date: 10/29/2018 CLINICAL DATA:  Patient status post ultrasound-guided biopsy right breast mass and right axillary lymph node. EXAM: DIAGNOSTIC RIGHT MAMMOGRAM POST ULTRASOUND BIOPSY COMPARISON:  Previous exam(s). FINDINGS: Mammographic images were obtained following ultrasound guided biopsy of right breast mass 8 o'clock position and right axillary lymph node. Right breast mass 8 o'clock position: Ribbon shaped marking clip: In appropriate position. Right axillary lymph node: HydroMARK clip: In appropriate position. IMPRESSION: Appropriate position biopsy marking clips status post ultrasound-guided biopsy right breast mass and right axillary lymph node. Final Assessment: Post Procedure Mammograms for Marker Placement Electronically Signed   By: Lovey Newcomer M.D.   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 LYMPH NODE, NEGATIVE FOR CARCINOMA of the Right axilla. This was found to be concordant by Dr. Lovey Newcomer. Pathology results were discussed with the patient by telephone. The patient reported doing well after the biopsies with tenderness at the sites. Post biopsy instructions and care were reviewed and questions were answered. The patient was encouraged to call [facility] for any additional concerns. The patient was referred to [facility] on November 06, 2018. Recommendation for a bilateral breast MRI due to family history and heterogeneously dense breasts. Pathology results reported by Terie Purser, RN on 10/31/2018. Electronically Signed   By: Lovey Newcomer M.D.   On: 10/31/2018 08:57   Result Date: 10/31/2018 CLINICAL DATA:  Patient with indeterminate right breast mass 8 o'clock position. EXAM: ULTRASOUND GUIDED RIGHT BREAST CORE NEEDLE BIOPSY COMPARISON:  Previous exam(s). FINDINGS: I met with the patient and we discussed the procedure of ultrasound-guided biopsy, including benefits and alternatives. We discussed the high likelihood of a successful procedure. We discussed the risks of the procedure, including infection, bleeding, tissue injury, clip migration, and inadequate sampling. Informed written consent was given. The usual time-out protocol was performed immediately prior to the procedure. Lesion quadrant: Lower outer quadrant Using sterile technique and 1% Lidocaine as local anesthetic, under direct ultrasound visualization, a 12 gauge spring-loaded device was used to perform biopsy of right breast mass 8 o'clock position using a lateral approach. At the conclusion of the procedure a ribbon shaped  tissue marker clip was deployed into the biopsy cavity. Follow up 2 view mammogram was performed and dictated separately. IMPRESSION: Ultrasound guided biopsy of right breast mass 8 o'clock position. No apparent complications. Electronically Signed: By: Lovey Newcomer M.D. On: 10/29/2018 11:45     ELIGIBLE FOR AVAILABLE RESEARCH PROTOCOL: consider UPBEAT   ASSESSMENT: 48 y.o. [place] Parkins, [place] woman status post right breast lower outer quadrant biopsy 10/29/2018 for a clinical T2 N0, stage IIB invasive ductal carcinoma, grade 3, triple negative, with an MIB-1 of 80%  (a) CT of the chest 11/15/2018 shows possible liver metastases, biopsy pending  (b) bone scan 11/15/2018 shows no bone lesions  (1) neoadjuvant chemotherapy will consist of doxorubicin and cyclophosphamide in dose dense fashion x4, starting 11/19/2018 (  or 11/22/2018), to be followed by weekly paclitaxel and carboplatin x12  (2) patient desires ovarian function preservation: Zoladex started 11/08/2018  (3) definitive surgery to follow chemo  (4) adjuvant radiation to follow  (5) genetics testing pending    PLAN: I spent approximately 30 minutes face to face with Brittany Rodgers with more than 50% of that time spent in counseling and coordination of care.  We went over the available results from her staging scans which show significant involvement of the right breast, with apparent extension into the pectoralis.  There is minimal adenopathy.  The left breast is fine.  The bone scan by my reading looks good, but the CT of the chest unfortunately shows liver lesions which are highly suspicious for metastatic disease.  These will have to be biopsied.  I would prefer not to start chemotherapy until we have the liver biopsy done, and we are trying to schedule this as early as possible.  Tentatively we are moving the chemotherapy back to 11/22/2018.  We did discuss how to take the supportive medications, and she also discussed this with the  chemotherapy teaching nurse.  I am entering the appropriate orders for her today.  She will see me again on day 1 cycle 1 and we will discuss the results of the liver biopsy and the implications if positive at that time  She knows to call for any other issue that may develop before the next visit   Brittany Rodgers has a good understanding of the overall plan. She agrees with it. She knows the goal of  , [name], MD  11/14/18 1:14 PM Medical Oncology and Hematology [facility] [address] [place], [place] [address] Tel. [phone]    Fax. [phone]   I, Jacqualyn Posey am acting as a Education administrator for Chauncey Cruel, MD.   I, Lurline Del MD, have reviewed the above documentation for accuracy and completeness, and I agree with the above.

## 2018-11-14 NOTE — Anesthesia Procedure Notes (Signed)
Procedure Name: LMA Insertion Date/Time: 11/14/2018 11:36 AM Performed by: Genelle Bal, CRNA Pre-anesthesia Checklist: Patient identified, Emergency Drugs available, Suction available and Patient being monitored Patient Re-evaluated:Patient Re-evaluated prior to induction Oxygen Delivery Method: Circle system utilized Preoxygenation: Pre-oxygenation with 100% oxygen Induction Type: IV induction Ventilation: Mask ventilation without difficulty LMA: LMA inserted LMA Size: 4.0 Number of attempts: 1 Airway Equipment and Method: Bite block Placement Confirmation: positive ETCO2 Tube secured with: Tape Dental Injury: Teeth and Oropharynx as per pre-operative assessment

## 2018-11-14 NOTE — Anesthesia Postprocedure Evaluation (Signed)
Anesthesia Post Note  Patient: Brittany Rodgers  Procedure(s) Performed: INSERTION PORT-A-CATH WITH ULTRASOUND (Right Chest)     Patient location during evaluation: PACU Anesthesia Type: General Level of consciousness: awake Pain management: pain level controlled Vital Signs Assessment: post-procedure vital signs reviewed and stable Respiratory status: spontaneous breathing Cardiovascular status: stable Postop Assessment: no apparent nausea or vomiting Anesthetic complications: no    Last Vitals:  Vitals:   11/14/18 1230 11/14/18 1245  BP: 137/83 130/84  Pulse: 91 80  Resp: 17 15  Temp:    SpO2: 100% 100%    Last Pain:  Vitals:   11/14/18 1225  TempSrc:   PainSc: 0-No pain                 Sheera Illingworth

## 2018-11-14 NOTE — Op Note (Signed)
Preoperative diagnosis:breast cancer, clinical stage II Postoperative diagnosis: same as above Procedure: right ij US guided powerport insertion Surgeon: Dr Serita Grammes EBL: minimal Anes: general  Specimensnone Complications none Drains none Sponge count correct Dispo to pacu stable  Indications: This is a68 yof with newly diagnosed breast cancer. We discussed all options and elected to proceed with systemic therapy.She is due to begin chemotherapy. We discussed port placement.  Procedure: After informed consent was obtained the patient was taken to the operating room. She was given antibiotics. Sequential compression devices were on her legs. She was then placed under general anesthesia. Then she was prepped and draped in the standard sterile surgical fashion. Surgical timeout was then performed.  Ithenused the ultrasound to identify the right internal jugular vein. I then accessed the vein using the ultrasound.This aspirated blood. I then placed the wire. This was confirmed by fluoroscopy and ultrasound to be in the correct position.I then infiltrated marcaine and made an incision. I created a pocket. I tunneled the line between the 2 sites.I then dilated the tract and placed the dilator assembly with the sheath. This was done under fluoroscopy. I then removed the sheath and dilator. The wire was also removed. The line was then pulled back to be in the venacava. I hooked this up to the port. I sutured this into place with 2-0 Prolene in 2 places. This aspirated blood and flushed easily.This was confirmed with a final fluoroscopy. I then closed this with 2-0 Vicryl and 4-0 Monocryl.This withdrew blood and I placed heparin in it.Dermabond was placed on both the incisions.She tolerated this well and was transferred to the recovery room in stable condition

## 2018-11-14 NOTE — Interval H&P Note (Signed)
History and Physical Interval Note:  11/14/2018 10:53 AM  Brittany Rodgers  has presented today for surgery, with the diagnosis of BREAST CANCER  The various methods of treatment have been discussed with the patient and family. After consideration of risks, benefits and other options for treatment, the patient has consented to  Procedure(s): INSERTION PORT-A-CATH WITH ULTRASOUND (N/A) as a surgical intervention .  The patient's history has been reviewed, patient examined, no change in status, stable for surgery.  I have reviewed the patient's chart and labs.  Questions were answered to the patient's satisfaction.     Rolm Bookbinder

## 2018-11-14 NOTE — Transfer of Care (Signed)
Immediate Anesthesia Transfer of Care Note  Patient: Brittany Rodgers  Procedure(s) Performed: INSERTION PORT-A-CATH WITH ULTRASOUND (Right Chest)  Patient Location: PACU  Anesthesia Type:General  Level of Consciousness: awake, alert  and oriented  Airway & Oxygen Therapy: Patient Spontanous Breathing and Patient connected to face mask oxygen  Post-op Assessment: Report given to RN and Post -op Vital signs reviewed and stable  Post vital signs: Reviewed and stable  Last Vitals:  Vitals Value Taken Time  BP 149/90 11/14/2018 12:24 PM  Temp    Pulse 102 11/14/2018 12:26 PM  Resp 21 11/14/2018 12:26 PM  SpO2 100 % 11/14/2018 12:26 PM  Vitals shown include unvalidated device data.  Last Pain:  Vitals:   11/14/18 1030  TempSrc: Oral  PainSc: 8       Patients Stated Pain Goal: 8 (58/09/98 [id number])  Complications: No apparent anesthesia complications

## 2018-11-15 ENCOUNTER — Encounter (HOSPITAL_COMMUNITY)
Admission: RE | Admit: 2018-11-15 | Discharge: 2018-11-15 | Disposition: A | Discharge status: Home / Self Care | Payer: BLUE CROSS/BLUE SHIELD | Source: Ambulatory Visit | Attending: Oncology | Admitting: Oncology

## 2018-11-15 ENCOUNTER — Telehealth: Payer: Self-pay | Admitting: Oncology

## 2018-11-15 ENCOUNTER — Encounter (HOSPITAL_COMMUNITY): Payer: BLUE CROSS/BLUE SHIELD

## 2018-11-15 ENCOUNTER — Other Ambulatory Visit: Payer: Self-pay | Admitting: *Deleted

## 2018-11-15 ENCOUNTER — Encounter (HOSPITAL_BASED_OUTPATIENT_CLINIC_OR_DEPARTMENT_OTHER): Payer: Self-pay | Admitting: General Surgery

## 2018-11-15 ENCOUNTER — Other Ambulatory Visit (HOSPITAL_COMMUNITY): Payer: BLUE CROSS/BLUE SHIELD

## 2018-11-15 ENCOUNTER — Ambulatory Visit (HOSPITAL_COMMUNITY)
Admission: RE | Admit: 2018-11-15 | Discharge: 2018-11-15 | Disposition: A | Discharge status: Home / Self Care | Payer: BLUE CROSS/BLUE SHIELD | Source: Ambulatory Visit | Attending: Oncology | Admitting: Oncology

## 2018-11-15 ENCOUNTER — Telehealth: Payer: Self-pay | Admitting: *Deleted

## 2018-11-15 ENCOUNTER — Inpatient Hospital Stay (HOSPITAL_BASED_OUTPATIENT_CLINIC_OR_DEPARTMENT_OTHER): Payer: BLUE CROSS/BLUE SHIELD | Admitting: Oncology

## 2018-11-15 VITALS — BP 131/78 | HR 90 | Temp 97.8°F | Resp 18 | Ht 62.0 in | Wt 163.3 lb

## 2018-11-15 DIAGNOSIS — C50511 Malignant neoplasm of lower-outer quadrant of right female breast: Secondary | ICD-10-CM

## 2018-11-15 DIAGNOSIS — Z79899 Other long term (current) drug therapy: Secondary | ICD-10-CM | POA: Diagnosis not present

## 2018-11-15 DIAGNOSIS — Z171 Estrogen receptor negative status [ER-]: Principal | ICD-10-CM

## 2018-11-15 MED ORDER — IOHEXOL 300 MG/ML  SOLN
75.0000 mL | Freq: Once | INTRAMUSCULAR | Status: AC | PRN
  Administered 2018-11-15: 75 mL via INTRAVENOUS

## 2018-11-15 MED ORDER — TECHNETIUM TC 99M MEDRONATE IV KIT
20.0000 | PACK | Freq: Once | INTRAVENOUS | Status: AC | PRN
  Administered 2018-11-15: 20 via INTRAVENOUS

## 2018-11-15 MED ORDER — LORATADINE 10 MG PO TABS: 10.0000 mg | ORAL_TABLET | Freq: Every day | ORAL | 0 refills | Status: DC

## 2018-11-15 MED ORDER — DEXAMETHASONE 4 MG PO TABS: ORAL_TABLET | ORAL | 1 refills | Status: DC

## 2018-11-15 MED ORDER — LORAZEPAM 0.5 MG PO TABS: 0.5000 mg | ORAL_TABLET | Freq: Every evening | ORAL | 0 refills | Status: DC | PRN

## 2018-11-15 MED ORDER — SODIUM CHLORIDE (PF) 0.9 % IJ SOLN
INTRAMUSCULAR | Status: AC
  Filled 2018-11-15: qty 50

## 2018-11-15 MED ORDER — LIDOCAINE-PRILOCAINE 2.5-2.5 % EX CREA: TOPICAL_CREAM | CUTANEOUS | 3 refills | Status: DC

## 2018-11-15 MED ORDER — PROCHLORPERAZINE MALEATE 10 MG PO TABS: 10.0000 mg | ORAL_TABLET | Freq: Four times a day (QID) | ORAL | 1 refills | Status: DC | PRN

## 2018-11-15 NOTE — Telephone Encounter (Signed)
Spoke with patient on the phone - they had gotten my card from the chemo education nurse. I have set her up with a ride to her appt on 2/25 and will have the waiver signed then.

## 2018-11-15 NOTE — Telephone Encounter (Signed)
  Oncology Nurse Navigator Documentation  Navigator Location: [facility] (11/15/18 1500)   )Navigator Encounter Type: Telephone;[facility] Follow-up (11/15/18 1500) Telephone: Outgoing Call;Clinic/[facility] Follow-up (11/15/18 1500)       Genetic Counseling Date: 11/28/18 (11/15/18 1500) Genetic Counseling Type: Non-Urgent (11/15/18 1500)         Patient Visit Type: MedOnc (11/15/18 1500)                              Time Spent with Patient: 15 (11/15/18 1500)

## 2018-11-15 NOTE — Progress Notes (Signed)
[id number]  Met with unaccompanied patient for approximately 5 minutes. Followed up with patient regarding UPBEAT trial. Pt declined to enroll in UPBEAT due to not having enough time. Thanked patient for her time and interest. Johney Maine RN, BSN Clinical Research  11/15/18 2:25 PM

## 2018-11-15 NOTE — Progress Notes (Signed)
DCP-001  This RN briefly explained purpose of DCP-001. Asked patient if she was interested in it and if she had time for this RN to discuss it further. Pt declined further information or to enroll in DCP. Thanked patient for her time. Johney Maine RN, BSN Clinical Research  11/15/18 2:27 PM

## 2018-11-17 ENCOUNTER — Other Ambulatory Visit: Payer: Self-pay | Admitting: Oncology

## 2018-11-17 DIAGNOSIS — Z171 Estrogen receptor negative status [ER-]: Principal | ICD-10-CM

## 2018-11-17 DIAGNOSIS — C50511 Malignant neoplasm of lower-outer quadrant of right female breast: Secondary | ICD-10-CM

## 2018-11-19 ENCOUNTER — Inpatient Hospital Stay: Payer: BLUE CROSS/BLUE SHIELD

## 2018-11-19 NOTE — Progress Notes (Signed)
FMLA successfully faxed to [facility] at [phone]. Mailed copy to patient address on file.

## 2018-11-21 ENCOUNTER — Other Ambulatory Visit: Payer: Self-pay | Admitting: Radiology

## 2018-11-21 ENCOUNTER — Ambulatory Visit: Payer: BLUE CROSS/BLUE SHIELD

## 2018-11-22 ENCOUNTER — Ambulatory Visit: Payer: BLUE CROSS/BLUE SHIELD

## 2018-11-22 ENCOUNTER — Other Ambulatory Visit: Payer: Self-pay | Admitting: Radiology

## 2018-11-22 ENCOUNTER — Ambulatory Visit: Payer: BLUE CROSS/BLUE SHIELD | Admitting: Oncology

## 2018-11-23 ENCOUNTER — Ambulatory Visit: Payer: BLUE CROSS/BLUE SHIELD

## 2018-11-25 ENCOUNTER — Other Ambulatory Visit: Payer: Self-pay

## 2018-11-25 ENCOUNTER — Ambulatory Visit (HOSPITAL_COMMUNITY)
Admission: RE | Admit: 2018-11-25 | Discharge: 2018-11-25 | Disposition: A | Discharge status: Home / Self Care | Payer: BLUE CROSS/BLUE SHIELD | Source: Ambulatory Visit | Attending: Oncology | Admitting: Oncology

## 2018-11-25 DIAGNOSIS — C50511 Malignant neoplasm of lower-outer quadrant of right female breast: Secondary | ICD-10-CM | POA: Diagnosis present

## 2018-11-25 DIAGNOSIS — E785 Hyperlipidemia, unspecified: Secondary | ICD-10-CM | POA: Diagnosis not present

## 2018-11-25 DIAGNOSIS — K219 Gastro-esophageal reflux disease without esophagitis: Secondary | ICD-10-CM | POA: Diagnosis not present

## 2018-11-25 DIAGNOSIS — Z888 Allergy status to other drugs, medicaments and biological substances status: Secondary | ICD-10-CM | POA: Insufficient documentation

## 2018-11-25 DIAGNOSIS — Z803 Family history of malignant neoplasm of breast: Secondary | ICD-10-CM | POA: Insufficient documentation

## 2018-11-25 DIAGNOSIS — Z171 Estrogen receptor negative status [ER-]: Secondary | ICD-10-CM | POA: Diagnosis not present

## 2018-11-25 DIAGNOSIS — Z79899 Other long term (current) drug therapy: Secondary | ICD-10-CM | POA: Insufficient documentation

## 2018-11-25 DIAGNOSIS — Z882 Allergy status to sulfonamides status: Secondary | ICD-10-CM | POA: Diagnosis not present

## 2018-11-25 DIAGNOSIS — R590 Localized enlarged lymph nodes: Secondary | ICD-10-CM | POA: Insufficient documentation

## 2018-11-25 LAB — COMPREHENSIVE METABOLIC PANEL
ALT: 35 U/L (ref 0–44)
AST: 16 U/L (ref 15–41)
Albumin: 4.3 g/dL (ref 3.5–5.0)
Alkaline Phosphatase: 96 U/L (ref 38–126)
Anion gap: 10 (ref 5–15)
BUN: 10 mg/dL (ref 6–20)
CO2: 27 mmol/L (ref 22–32)
CREATININE: 0.64 mg/dL (ref 0.44–1.00)
Calcium: 8.9 mg/dL (ref 8.9–10.3)
Chloride: 102 mmol/L (ref 98–111)
GFR calc Af Amer: >60 mL/min (ref >60)
GFR calc non Af Amer: >60 mL/min (ref >60)
Glucose, Bld: 93 mg/dL (ref 70–99)
Potassium: 3.3 mmol/L — ABNORMAL LOW (ref 3.5–5.1)
Sodium: 139 mmol/L (ref 135–145)
Total Bilirubin: 0.7 mg/dL (ref 0.3–1.2)
Total Protein: 7.7 g/dL (ref 6.5–8.1)

## 2018-11-25 LAB — CBC WITH DIFFERENTIAL/PLATELET
Abs Immature Granulocytes: 0.04 10*3/uL (ref 0.00–0.07)
Basophils Absolute: 0 10*3/uL (ref 0.0–0.1)
Basophils Relative: 0 %
Eosinophils Absolute: 0.2 10*3/uL (ref 0.0–0.5)
Eosinophils Relative: 3 %
HCT: 40.4 % (ref 36.0–46.0)
HEMOGLOBIN: 12.8 g/dL (ref 12.0–15.0)
Immature Granulocytes: 0 %
Lymphocytes Relative: 20 %
Lymphs Abs: 1.9 10*3/uL (ref 0.7–4.0)
MCH: 29.1 pg (ref 26.0–34.0)
MCHC: 31.7 g/dL (ref 30.0–36.0)
MCV: 91.8 fL (ref 80.0–100.0)
Monocytes Absolute: 0.5 10*3/uL (ref 0.1–1.0)
Monocytes Relative: 5 %
NEUTROS ABS: 6.5 10*3/uL (ref 1.7–7.7)
Neutrophils Relative %: 72 %
Platelets: 326 10*3/uL (ref 150–400)
RBC: 4.4 MIL/uL (ref 3.87–5.11)
RDW: 12.4 % (ref 11.5–15.5)
WBC: 9.2 10*3/uL (ref 4.0–10.5)
nRBC: 0 % (ref 0.0–0.2)

## 2018-11-25 LAB — PROTIME-INR
INR: 0.9 (ref 0.8–1.2)
Prothrombin Time: 12 seconds (ref 11.4–15.2)

## 2018-11-25 MED ORDER — LIDOCAINE HCL 1 % IJ SOLN
INTRAMUSCULAR | Status: AC
  Filled 2018-11-25: qty 20

## 2018-11-25 MED ORDER — GELATIN ABSORBABLE 12-7 MM EX MISC
CUTANEOUS | Status: AC
  Filled 2018-11-25: qty 1

## 2018-11-25 MED ORDER — FENTANYL CITRATE (PF) 100 MCG/2ML IJ SOLN
INTRAMUSCULAR | Status: AC
  Filled 2018-11-25: qty 2

## 2018-11-25 MED ORDER — HYDROCODONE-ACETAMINOPHEN 5-325 MG PO TABS
ORAL_TABLET | ORAL | Status: AC
  Filled 2018-11-25: qty 1

## 2018-11-25 MED ORDER — FENTANYL CITRATE (PF) 100 MCG/2ML IJ SOLN
INTRAMUSCULAR | Status: AC | PRN
  Administered 2018-11-25 (×2): 50 ug via INTRAVENOUS

## 2018-11-25 MED ORDER — MIDAZOLAM HCL 2 MG/2ML IJ SOLN
INTRAMUSCULAR | Status: AC
  Filled 2018-11-25: qty 2

## 2018-11-25 MED ORDER — SODIUM CHLORIDE 0.9 % IV SOLN
INTRAVENOUS | Status: DC
  Administered 2018-11-25: 11:00:00 via INTRAVENOUS

## 2018-11-25 MED ORDER — HYDROCODONE-ACETAMINOPHEN 5-325 MG PO TABS: 1.0000 | ORAL_TABLET | ORAL | Status: DC | PRN

## 2018-11-25 MED ORDER — MIDAZOLAM HCL 2 MG/2ML IJ SOLN
INTRAMUSCULAR | Status: AC | PRN
  Administered 2018-11-25 (×4): 1 mg via INTRAVENOUS

## 2018-11-25 NOTE — Progress Notes (Signed)
Spoke with Jocelyn Lamer at [place] to clarify patient requesting to have a continuous leave of absence starting 11/01/18 through the end of chemotherapy treatment on 04/08/19.

## 2018-11-25 NOTE — Progress Notes (Signed)
[name]  [phone] [phone]    ID: Brittany Rodgers DOB: January 10, 1971  MR#: [id number]  FGH#:[id number]  Patient Care Team: [name], MD as PCP - General Rolm Bookbinder, MD as Consulting Physician (General Surgery) , [name], MD as Consulting Physician (Oncology) [name], MD as Attending Physician (Radiation Oncology) [name], MD as Consulting Physician (Gastroenterology) [name], MD as Consulting Physician (Obstetrics and Gynecology) OTHER MD: Dr. Siri Cole (urology)    CHIEF COMPLAINT: Triple negative breast cancer  CURRENT TREATMENT: Neoadjuvant chemotherapy, goserelin   HISTORY OF CURRENT ILLNESS: From the original intake note:  Brittany Rodgers presented with a palpable right breast lump and associated tenderness for approximately 1 week. She called her doctor immediately following  She underwent unilateral right diagnostic mammography with tomography and right breast ultrasonography at [facility] on 10/28/2018 showing: Breast Density Category C. Radiopaque BB was placed at the site of the patient's palpable lump in the central right breast. An irregular hyperdense mass with associated increased trabecular thickening is seen in the deep central right breast. On physical exam, a firm, fixed mass in the central right breast is palpated. Target ultrasound is performed, showing and irregular hypoechoic mass at the 8 o'clock position 3 cm from the nipple. It measures 3.6 x 2.9 x 2.3 cm. There is associated peripheral vascularity. Evaluation of the right axilla demonstrates a single prominent lymph node demonstrating 0.4 cm cortical thickening. The remaining lymph nodes ave normal appearance with a thin, symmetric cortex.   Accordingly on 10/29/2018 she proceeded to biopsy of the right breast area in question. The pathology from this procedure showed ([id number]): invasive ductal carcinoma, grade III.  Prognostic indicators significant for: estrogen receptor, 0% negative and progesterone receptor, 0% negative. Proliferation marker Ki67 at 80%. HER2 negative (1+) by immunohistochemistry.  Biopsy of the right axilla was performed on the same day. The pathology from this procedure showed ([id number]): lymph node, negative for carcinoma.  This was felt to be concordant  The patient's subsequent history is as detailed below.   INTERVAL HISTORY: [name] returns today for follow-up and treatment of her triple negative breast cancer.   She was scheduled to start neoadjuvant chemotherapy consiting of doxorubicin and cyclophosphamide in dose dense fashion x4 last week. However CT scan of the chest obtained 11/15/2018 showed, in addition to the right breast mass and right axillary adenopathy, 2 poorly marginated hypodense liver masses which were suspicious for metastatic disease. She underwent a liver biopsy on 11/25/2018. Pathology from this procedure is not yet available at the time of appointment.   She also underwent a whole body bone scan on 11/15/2018 showing Minimal uptake of 12 tracer at the shoulders, elbows, sternoclavicular joints, typically degenerative. No additional sites of abnormal osseous tracer accumulation are identified. Slight rotation of the pelvis. Expected urinary tract and soft tissue distribution of tracer.  She is interested in ovarian function preservation and received goserelin 11/08/2018.  She will start chemotherapy today.   REVIEW OF SYSTEMS: Due to soreness from her biopsy, Brittany Rodgers had some difficulty getting out of bed today. She takes Tramadol to treat, for which she takes Miralax to treat. Her plan for her hair is to get a wig as long as her insurance will cover it. She notes that her back has been bothering her a lot. Her right breast has been throbbing with pain recently as well. She has had some headaches, but she notes that they are likely due to seasonal allergies.  The patient denies visual changes, nausea, vomiting, or dizziness. There has been no unusual cough, phlegm production, or pleurisy. This been no change in bladder habits. The patient denies unexplained fatigue or unexplained weight loss, bleeding, rash, or fever. A detailed review of systems was otherwise noncontributory.    PAST MEDICAL HISTORY: Past Medical History:  Diagnosis Date  . Chronic cough   . GERD (gastroesophageal reflux disease)   . Hyperlipidemia   . IBS (irritable bowel syndrome)    sees Dr. Silvano Rusk   . Routine gynecological examination    sees Dr. Freda Munro     PAST SURGICAL HISTORY: Past Surgical History:  Procedure Laterality Date  . CESAREAN SECTION    . PORTACATH PLACEMENT Right 11/14/2018   Procedure: INSERTION PORT-A-CATH WITH ULTRASOUND;  Surgeon: Rolm Bookbinder, MD;  Location: [facility];  Service: General;  Laterality: Right;  . ROOT CANAL       FAMILY HISTORY: Family History  Problem Relation Age of Onset  . Hypertension Other   . Breast cancer Other   . Breast cancer Maternal Aunt    Brittany Rodgers's father died from an unknown cause at age 59. [name] died from unknown health complications at age 93. The patient has 4 sisters. [name] has 3 maternal aunts that have been diagnosed with breast cancer. Patient denies anyone in her family having ovarian, prostate, or pancreatic cancer.    GYNECOLOGIC HISTORY:  Patient's last menstrual period was 11/12/2018. 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 Her husband, Kealie Barrie, is a Administrator. The patient has two children, Brittany Rodgers and Brittany Rodgers, from another relationship. Brittany Rodgers is 20, lives in [place]. Brittany Rodgers is 68, lives in [place], and is in [employer].  The patient's husband [name] has a child, Brittany Rodgers,  from another relationship; she lives in [place] and is in school. The patient has 5 grandchildren. She attends the [employer].   ADVANCED DIRECTIVES: Brittany Rodgers's husband, [name], is automatically her healthcare power of attorney.     HEALTH MAINTENANCE: Social History   Tobacco Use  . Smoking status: Never Smoker  . Smokeless tobacco: Never Used  Substance Use Topics  . Alcohol use: No    Alcohol/week: 0.0 standard drinks  . Drug use: No    Colonoscopy: yes  PAP: 02/2018  Bone density: no   Allergies  Allergen Reactions  . Barbiturates Other (See Comments)    Keeps pt awake  . Sulfonamide Derivatives Itching    Current Outpatient Medications  Medication Sig Dispense Refill  . atorvastatin (LIPITOR) 20 MG tablet Take 1 tablet (20 mg total) by mouth daily. 90 tablet 3  . cyclobenzaprine (FLEXERIL) 10 MG tablet TAKE 1 TABLET BY MOUTH THREE TIMES DAILY AS NEEDED FOR MUSCLE SPASMS 90 tablet 0  . dexamethasone (DECADRON) 4 MG tablet Take 2 tablets by mouth once a day on the day after chemotherapy and then take 2 tablets two times a day for 2 days. Take with food. 30 tablet 1  . lidocaine-prilocaine (EMLA) cream Apply to affected area once 30 g 3  . loratadine (CLARITIN) 10 MG tablet Take 1 tablet (10 mg total) by mouth daily. 60 tablet 0  . LORazepam (ATIVAN) 0.5 MG tablet Take 1 tablet (0.5 mg total) by mouth at bedtime as needed (Nausea or vomiting). 30 tablet 0  .  Omeprazole-Sodium Bicarbonate (ZEGERID) 20-1100 MG CAPS capsule Take 1 capsule by mouth daily before breakfast. 90 each 3  . polyethylene glycol (MIRALAX / GLYCOLAX) packet Take 17 g by mouth as needed. (Patient taking differently: Take 17 g by mouth daily as needed (constipation). Mix in 8 oz liquid and drink) 100 each 5  . potassium chloride (K-DUR) 10 MEQ tablet TAKE 1 TABLET BY MOUTH ONCE DAILY 90 tablet 3  . prochlorperazine (COMPAZINE) 10 MG tablet Take 1 tablet (10 mg total) by mouth every 6 (six) hours as  needed (Nausea or vomiting). 30 tablet 1  . traMADol (ULTRAM) 50 MG tablet Take 1-2 tablets (50-100 mg total) by mouth every 6 (six) hours as needed for moderate pain. 20 tablet 0   No current facility-administered medications for this visit.      OBJECTIVE: Young African-American woman in no acute distress  Vitals:   11/26/18 0804  BP: 134/63  Pulse: 89  Resp: 18  Temp: 98.9 F (37.2 C)  SpO2: 100%     Body mass index is 29.28 kg/m.   Wt Readings from Last 3 Encounters:  11/26/18 160 lb 1.6 oz (72.6 kg)  11/15/18 163 lb 4.8 oz (74.1 kg)  11/14/18 162 lb 0.6 oz (73.5 kg)      ECOG FS:1 - Symptomatic but completely ambulatory  Sclerae unicteric, pupils round and equal No cervical or supraclavicular adenopathy Lungs no rales or rhonchi Heart regular rate and rhythm Abd soft, nontender, positive bowel sounds MSK no focal spinal tenderness, no upper extremity lymphedema Neuro: nonfocal, well oriented, appropriate affect Breasts: The mass in the right breast is easily palpable in the inferior outer aspect of the breast, it measures about 4 cm by palpation, it is movable, and there is very slight erythema associated with it, but no peau d'orange.  The left breast is unremarkable.    LAB RESULTS:  CMP     Component Value Date/Time   NA 139 11/25/2018 1130   K 3.3 (L) 11/25/2018 1130   CL 102 11/25/2018 1130   CO2 27 11/25/2018 1130   GLUCOSE 93 11/25/2018 1130   BUN 10 11/25/2018 1130   CREATININE 0.64 11/25/2018 1130   CREATININE 0.86 11/06/2018 0821   CALCIUM 8.9 11/25/2018 1130   PROT 7.7 11/25/2018 1130   ALBUMIN 4.3 11/25/2018 1130   AST 16 11/25/2018 1130   AST 11 (L) 11/06/2018 0821   ALT 35 11/25/2018 1130   ALT 25 11/06/2018 0821   ALKPHOS 96 11/25/2018 1130   BILITOT 0.7 11/25/2018 1130   BILITOT 0.5 11/06/2018 0821   GFRNONAA >60 11/25/2018 1130   GFRNONAA >60 11/06/2018 0821   GFRAA >60 11/25/2018 1130   GFRAA >60 11/06/2018 0821    No results  found for: TOTALPROTELP, ALBUMINELP, A1GS, A2GS, BETS, BETA2SER, GAMS, MSPIKE, SPEI  No results found for: KPAFRELGTCHN, LAMBDASER, KAPLAMBRATIO  Lab Results  Component Value Date   WBC 11.2 (H) 11/26/2018   NEUTROABS 9.1 (H) 11/26/2018   HGB 12.4 11/26/2018   HCT 38.6 11/26/2018   MCV 89.1 11/26/2018   PLT 300 11/26/2018    '@LASTCHEMISTRY'$ @  No results found for: LABCA2  No components found for: NFAOZH086  Recent Labs  Lab 11/25/18 1130  INR 0.9    No results found for: LABCA2  No results found for: VHQ469  No results found for: GEX528  No results found for: UXL244  No results found for: CA2729  No components found for: HGQUANT  No results found for: CEA1 /  No results found for: CEA1   No results found for: AFPTUMOR  No results found for: [place]  No results found for: PSA1  Appointment on 11/26/2018  Component Date Value Ref Range Status  . WBC 11/26/2018 11.2* 4.0 - 10.5 K/uL Final  . RBC 11/26/2018 4.33  3.87 - 5.11 MIL/uL Final  . Hemoglobin 11/26/2018 12.4  12.0 - 15.0 g/dL Final  . HCT 11/26/2018 38.6  36.0 - 46.0 % Final  . MCV 11/26/2018 89.1  80.0 - 100.0 fL Final  . MCH 11/26/2018 28.6  26.0 - 34.0 pg Final  . MCHC 11/26/2018 32.1  30.0 - 36.0 g/dL Final  . RDW 11/26/2018 12.4  11.5 - 15.5 % Final  . Platelets 11/26/2018 300  150 - 400 K/uL Final  . nRBC 11/26/2018 0.0  0.0 - 0.2 % Final  . Neutrophils Relative % 11/26/2018 81  % Final  . Neutro Abs 11/26/2018 9.1* 1.7 - 7.7 K/uL Final  . Lymphocytes Relative 11/26/2018 15  % Final  . Lymphs Abs 11/26/2018 1.6  0.7 - 4.0 K/uL Final  . Monocytes Relative 11/26/2018 3  % Final  . Monocytes Absolute 11/26/2018 0.3  0.1 - 1.0 K/uL Final  . Eosinophils Relative 11/26/2018 1  % Final  . Eosinophils Absolute 11/26/2018 0.1  0.0 - 0.5 K/uL Final  . Basophils Relative 11/26/2018 0  % Final  . Basophils Absolute 11/26/2018 0.0  0.0 - 0.1 K/uL Final  . Immature Granulocytes 11/26/2018 0  % Final    . Abs Immature Granulocytes 11/26/2018 0.02  0.00 - 0.07 K/uL Final   Performed at [facility], [place] [address] [place], [place] [address]  Hospital Outpatient Visit on 11/25/2018  Component Date Value Ref Range Status  . WBC 11/25/2018 9.2  4.0 - 10.5 K/uL Final  . RBC 11/25/2018 4.40  3.87 - 5.11 MIL/uL Final  . Hemoglobin 11/25/2018 12.8  12.0 - 15.0 g/dL Final  . HCT 11/25/2018 40.4  36.0 - 46.0 % Final  . MCV 11/25/2018 91.8  80.0 - 100.0 fL Final  . MCH 11/25/2018 29.1  26.0 - 34.0 pg Final  . MCHC 11/25/2018 31.7  30.0 - 36.0 g/dL Final  . RDW 11/25/2018 12.4  11.5 - 15.5 % Final  . Platelets 11/25/2018 326  150 - 400 K/uL Final  . nRBC 11/25/2018 0.0  0.0 - 0.2 % Final  . Neutrophils Relative % 11/25/2018 72  % Final  . Neutro Abs 11/25/2018 6.5  1.7 - 7.7 K/uL Final  . Lymphocytes Relative 11/25/2018 20  % Final  . Lymphs Abs 11/25/2018 1.9  0.7 - 4.0 K/uL Final  . Monocytes Relative 11/25/2018 5  % Final  . Monocytes Absolute 11/25/2018 0.5  0.1 - 1.0 K/uL Final  . Eosinophils Relative 11/25/2018 3  % Final  . Eosinophils Absolute 11/25/2018 0.2  0.0 - 0.5 K/uL Final  . Basophils Relative 11/25/2018 0  % Final  . Basophils Absolute 11/25/2018 0.0  0.0 - 0.1 K/uL Final  . Immature Granulocytes 11/25/2018 0  % Final  . Abs Immature Granulocytes 11/25/2018 0.04  0.00 - 0.07 K/uL Final   Performed at [facility], [place] [address], [place], [place] [address]  . Sodium 11/25/2018 139  135 - 145 mmol/L Final  . Potassium 11/25/2018 3.3* 3.5 - 5.1 mmol/L Final  . Chloride 11/25/2018 102  98 - 111 mmol/L Final  . CO2 11/25/2018 27  22 - 32 mmol/L Final  .  Glucose, Bld 11/25/2018 93  70 - 99 mg/dL Final  . BUN 11/25/2018 10  6 - 20 mg/dL Final  . Creatinine, Ser 11/25/2018 0.64  0.44 - 1.00 mg/dL Final  . Calcium 11/25/2018 8.9  8.9 - 10.3 mg/dL Final  . Total Protein 11/25/2018 7.7  6.5 - 8.1 g/dL Final  . Albumin 11/25/2018 4.3  3.5  - 5.0 g/dL Final  . AST 11/25/2018 16  15 - 41 U/L Final  . ALT 11/25/2018 35  0 - 44 U/L Final  . Alkaline Phosphatase 11/25/2018 96  38 - 126 U/L Final  . Total Bilirubin 11/25/2018 0.7  0.3 - 1.2 mg/dL Final  . GFR calc non Af Amer 11/25/2018 >60  >60 mL/min Final  . GFR calc Af Amer 11/25/2018 >60  >60 mL/min Final  . Anion gap 11/25/2018 10  5 - 15 Final   Performed at [facility], [place] [address], [place], [place] [address]  . Prothrombin Time 11/25/2018 12.0  11.4 - 15.2 seconds Final  . INR 11/25/2018 0.9  0.8 - 1.2 Final   Comment: (NOTE) INR goal varies based on device and disease states. Performed at [facility], [place] [address] [place], [place] [address]     (this displays the last labs from the last 3 days)  No results found for: TOTALPROTELP, ALBUMINELP, A1GS, A2GS, BETS, BETA2SER, GAMS, MSPIKE, SPEI (this displays SPEP labs)  No results found for: KPAFRELGTCHN, LAMBDASER, KAPLAMBRATIO (kappa/lambda light chains)  No results found for: HGBA, HGBA2QUANT, HGBFQUANT, HGBSQUAN (Hemoglobinopathy evaluation)   No results found for: LDH  No results found for: IRON, TIBC, IRONPCTSAT (Iron and TIBC)  No results found for: FERRITIN  Urinalysis    Component Value Date/Time   COLORURINE YELLOW 05/18/2010 1042   APPEARANCEUR CLOUDY (A) 05/18/2010 1042   LABSPEC 1.028 05/18/2010 1042   PHURINE 5.5 05/18/2010 1042   GLUCOSEU NEGATIVE 05/18/2010 1042   HGBUR NEGATIVE 05/18/2010 1042   BILIRUBINUR negative 05/31/2018 1011   KETONESUR NEGATIVE 05/18/2010 1042   PROTEINUR Positive (A) 05/31/2018 1011   PROTEINUR NEGATIVE 05/18/2010 1042   UROBILINOGEN 0.2 05/31/2018 1011   UROBILINOGEN 0.2 05/18/2010 1042   NITRITE NEGATIVE 05/31/2018 1011   NITRITE NEGATIVE 05/18/2010 1042   LEUKOCYTESUR Negative 05/31/2018 1011     STUDIES:  Ct Chest W Contrast  Result Date: 11/15/2018 CLINICAL DATA:  New diagnosis of right breast cancer,  presenting for chest staging prior to chemotherapy. EXAM: CT CHEST WITH CONTRAST TECHNIQUE: Multidetector CT imaging of the chest was performed during intravenous contrast administration. CONTRAST:  69m OMNIPAQUE IOHEXOL 300 MG/ML  SOLN COMPARISON:  11/14/2018 chest radiograph. FINDINGS: Cardiovascular: Normal heart size. No significant pericardial effusion/thickening. Right internal jugular Port-A-Cath terminates at the cavoatrial junction. Expected subcutaneous emphysema surrounding Port-A-Cath given recent placement. Great vessels are normal in course and caliber. No central pulmonary emboli. Mediastinum/Nodes: No discrete thyroid nodules. Unremarkable esophagus. Enlarged 1.1 cm right axillary node (series 2/image 54). No left axillary adenopathy. No mediastinal or hilar adenopathy. Lungs/Pleura: No pneumothorax. No pleural effusion. No acute consolidative airspace disease, lung masses or significant pulmonary nodules. Upper abdomen: There is a poorly marginated 4.9 x 3.1 cm heterogeneous hypodense mass at the posterior right liver margin (series 2/image 106), favor an exophytic liver mass, which abuts upper right kidney and right adrenal gland. There is partial visualization of a poorly marginated hypodense 1.8 x 1.7 cm segment 4B left liver lobe mass (series 2/image 128). Nonobstructing 3 mm upper right renal stone.  Musculoskeletal: No aggressive appearing focal osseous lesions. Heterogeneously enhancing posterior right breast 5.6 x 4.5 cm mass (series 2/image 80). IMPRESSION: 1. Posterior right breast 5.6 cm mass compatible with primary right breast malignancy. 2. Right axillary lymphadenopathy compatible with nodal metastatic disease. 3. Two poorly marginated hypodense liver masses, indeterminate, suspicious for liver metastases. Further staging evaluation with CT abdomen/pelvis with oral and IV contrast suggested. PET-CT or MRI abdomen without and with IV contrast may also be considered, as clinically  warranted. 4. No pulmonary nodules. Electronically Signed   By: Ilona Sorrel M.D.   On: 11/15/2018 12:55   Nm Bone Scan Whole Body  Result Date: 11/15/2018 CLINICAL DATA:  Invasive breast cancer EXAM: NUCLEAR MEDICINE WHOLE BODY BONE SCAN TECHNIQUE: Whole body anterior and posterior images were obtained approximately 3 hours after intravenous injection of radiopharmaceutical. RADIOPHARMACEUTICALS:  22 mCi Technetium-32mMDP IV COMPARISON:  None Radiographic correlation: CT chest 11/15/2018 FINDINGS: Minimal uptake of 12 tracer at the shoulders, elbows, sternoclavicular joints, typically degenerative. No additional sites of abnormal osseous tracer accumulation are identified. Slight rotation of the pelvis. Expected urinary tract and soft tissue distribution of tracer. IMPRESSION: No scintigraphic evidence of osseous metastatic disease. Electronically Signed   By: MLavonia DanaM.D.   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 A borderline abnormal right axillary lymph node was biopsied with no evidence of malignancy. LABS:  None obtained on site today. EXAM: BILATERAL BREAST MRI WITH AND WITHOUT CONTRAST TECHNIQUE: Multiplanar, multisequence MR images of both breasts were obtained prior to and following the intravenous administration of 7 ml of Gadavist Three-dimensional MR images were rendered by post-processing of the original MR data on an independent workstation. The three-dimensional MR images were interpreted, and findings are reported in the following complete MRI report for this study. Three dimensional images were evaluated at the independent DynaCad workstation COMPARISON:  Current and previous mammogram, ultrasound and biopsy examinations. FINDINGS: Breast composition: b. Scattered fibroglandular  tissue. Background parenchymal enhancement: Moderate. Right breast: 5.4 x 4.6 x 3.9 cm oval mass with a mildly to moderately thickened rind of peripheral enhancing soft tissue and larger, more confluent irregular enhancing soft tissue component posteriorly in the lower outer quadrant of the right breast. This corresponds to the recently biopsied malignancy. This is abutting the underlying pectoralis muscle and extending into the anterior aspect of the muscle with abnormal enhancement. The mass has a mixture of enhancement kinetics, including a small amount of rapid wash-in/washout kinetics and larger amount of persistent and plateau kinetics. This masses within a large area of non mass enhancement involving the lower outer quadrant and lower inner quadrant of the right breast and extending from the far anterior aspect of the breast to the far posterior aspect of the breast. This also has some extension into the upper outer and upper inner quadrants and, with the biopsied mass, involve an area measuring 9.6 x 6.8 x 4.9 cm. This has predominantly persistent enhancement kinetics with a small amount of plateau kinetics. Left breast: No mass or abnormal enhancement. Lymph nodes: There is 1 abnormally enlarged right axillary lymph node with abnormal cortical thickening, suspicious for a metastatic node. This corresponds to the recently biopsied node with benign results and contains a biopsy marker artifact. There is a prominent internal mammary vein on the  right with no enlarged internal mammary lymph nodes seen. Ancillary findings:  None. IMPRESSION: 1. 5.4 x 4.6 x 3.9 cm biopsy-proven malignancy in the posterior aspect of the lower outer quadrant of the right breast. This is invading the anterior aspect of the underlying pectoralis muscle and has a significant necrotic component. 2. Additional non mass enhancement involving all 4 quadrants of the right breast, extending from the anterior to the posterior aspect of the  breast. The non mass enhancement and biopsy-proven malignancy involve a combined area measuring 9.6 x 6.8 x 4.9 cm. The non mass enhancement is concerning four additional ductal carcinoma in situ. 3. Single abnormal appearing right axillary lymph node, corresponding to the recently biopsied lymph node with benign results. Despite the recent benign biopsy results, this is suspicious for a possible metastatic node. 4. No evidence of malignancy the on the left. RECOMMENDATION: 6: Treatment plan. BI-RADS CATEGORY  6: Known biopsy-proven malignancy. Electronically Signed   By: Claudie Revering M.D.   On: 11/12/2018 16:06   US Biopsy (liver)  Result Date: 11/25/2018 INDICATION: 48 year old female with a history of right lower outer quadrant breast cancer and evidence of nonspecific low-attenuation lesions within the liver on CT scan of the chest. She presents for ultrasound-guided biopsy to confirm tissue diagnosis of suspected breast cancer metastases to the liver. EXAM: Ultrasound-guided core biopsy, liver lesion MEDICATIONS: None. ANESTHESIA/SEDATION: Moderate (conscious) sedation was employed during this procedure. A total of Versed 4 mg and Fentanyl 100 mcg was administered intravenously. Moderate Sedation Time: 18 minutes. The patient's level of consciousness and vital signs were monitored continuously by radiology nursing throughout the procedure under my direct supervision. FLUOROSCOPY TIME:  None COMPLICATIONS: None immediate. PROCEDURE: Informed written consent was obtained from the patient after a thorough discussion of the procedural risks, benefits and alternatives. All questions were addressed. A timeout was performed prior to the initiation of the procedure. The liver was interrogated with ultrasound. To lesions are identified. The lesions are somewhat indistinct and hyperechoic in appearance. The lesion in hepatic segment 4 measures approximately 1.9 x 1.6 x 2.1 cm. The sonographic appearance is somewhat  suggestive of a benign hemangioma. However, given the clinical history of breast cancer, the decision was made to proceed with biopsy. Ultrasound was used to interrogate the right upper quadrant. The liver was successfully identified. A suitable skin entry site to approach the segment 4 lesion was selected and marked. The region was sterilely prepped and draped in the standard fashion using chlorhexidine skin prep. Local anesthesia was attained by infiltration with 1% lidocaine. A small dermatotomy was made. Under real-time sonographic guidance, a 17 gauge introducer needle was carefully advanced into the margin of the mass. Multiple 18 gauge core biopsies were then coaxially obtained using the bio Pince automated biopsy device. Biopsy specimens were placed in formalin and delivered to pathology for further analysis. As the introducer needle was removed, the biopsy tract was embolized with a Gel-Foam slurry. Post biopsy ultrasound imaging demonstrates no evidence of acute bleeding, perihepatic hematoma or other complication. The patient tolerated the procedure well. IMPRESSION: Technically successful ultrasound-guided core biopsy of hepatic lesion in segment 4. Of note, the imaging appearance is somewhat suggestive of a benign hemangioma. Signed, Criselda Peaches, MD, [facility] Radiology Electronically Signed   By: Jacqulynn Cadet M.D.   On: 11/25/2018 15:22   Dg Chest Port 1 View  Result Date: 11/14/2018 CLINICAL DATA:  Port-A-Cath placement. EXAM: PORTABLE CHEST 1 VIEW COMPARISON:  06/09/2008 FINDINGS: Right IJ approach central venous catheter terminates in the expected location of the cavoatrial junction. Cardiomediastinal silhouette is normal. Mediastinal contours appear intact. There is no evidence of focal airspace consolidation, pleural effusion or pneumothorax. Osseous structures are without acute abnormality. Soft tissues are grossly normal.  IMPRESSION: Right IJ approach central venous catheter terminates in the expected location of the cavoatrial junction. No evidence of pneumothorax. Electronically Signed   By: Fidela Salisbury M.D.   On: 11/14/2018 12:39   Dg Fluoro Guide Cv Line-no Report  Result Date: 11/14/2018 Fluoroscopy was utilized by the requesting physician.  No radiographic interpretation.   US Breast Ltd Uni Right Inc Axilla  Result Date: 10/28/2018 CLINICAL DATA:  48 year old female with a palpable right breast lump and associated tenderness for approximately 1 week. EXAM: DIGITAL DIAGNOSTIC RIGHT MAMMOGRAM WITH CAD AND TOMO ULTRASOUND RIGHT BREAST COMPARISON:  Previous exam(s). ACR Breast Density Category c: The breast tissue is heterogeneously dense, which may obscure small masses. FINDINGS: Radiopaque BB was placed at the site of the patient's palpable lump in the central right breast. An irregular hyperdense mass with associated increased trabecular thickening and is seen in the deep central right breast. Further evaluation with ultrasound was performed. Mammographic images were processed with CAD. On physical exam, I palpate a firm, fixed mass in the central right breast. Targeted ultrasound is performed, showing an irregular hypoechoic mass at the 8 o'clock position 3 cm from the nipple. It measures 3.6 x 2.9 x 2.3 cm. There is associated peripheral vascularity. Evaluation of the right axilla demonstrates a single prominent lymph node demonstrating 4 mm cortical thickening. The remaining lymph nodes have a normal appearance with a thin, symmetric cortex. IMPRESSION: 1. Suspicious right breast mass corresponding with the patient's palpable lump. 2. Indeterminate right axillary lymph node demonstrating up to 4 mm cortical thickening. RECOMMENDATION: Two area ultrasound-guided biopsy of the right breast and right axilla. If findings demonstrate malignancy, further evaluation with bilateral contrast-enhanced breast MRI is  recommended to evaluate extent of disease. I have discussed the findings and recommendations with the patient. Results were also provided in writing at the conclusion of the visit. If applicable, a reminder letter will be sent to the patient regarding the next appointment. BI-RADS CATEGORY  4: Suspicious. Electronically Signed   By: Kristopher Oppenheim M.D.   On: 10/28/2018 13:30   Mm Diag Breast Tomo Uni Right  Result Date: 10/28/2018 CLINICAL DATA:  48 year old female with a palpable right breast lump and associated tenderness for approximately 1 week. EXAM: DIGITAL DIAGNOSTIC RIGHT MAMMOGRAM WITH CAD AND TOMO ULTRASOUND RIGHT BREAST COMPARISON:  Previous exam(s). ACR Breast Density Category c: The breast tissue is heterogeneously dense, which may obscure small masses. FINDINGS: Radiopaque BB was placed at the site of the patient's palpable lump in the central right breast. An irregular hyperdense mass with associated increased trabecular thickening and is seen in the deep central right breast. Further evaluation with ultrasound was performed. Mammographic images were processed with CAD. On physical exam, I palpate a firm, fixed mass in the central right breast. Targeted ultrasound is performed, showing an irregular hypoechoic mass at the 8 o'clock position 3 cm from the nipple. It measures 3.6 x 2.9 x 2.3 cm. There is associated peripheral vascularity. Evaluation of the right axilla demonstrates a single prominent lymph node demonstrating 4 mm cortical thickening. The remaining lymph nodes have a normal appearance with a thin, symmetric cortex. IMPRESSION: 1. Suspicious right breast mass corresponding with the patient's palpable  lump. 2. Indeterminate right axillary lymph node demonstrating up to 4 mm cortical thickening. RECOMMENDATION: Two area ultrasound-guided biopsy of the right breast and right axilla. If findings demonstrate malignancy, further evaluation with bilateral contrast-enhanced breast MRI is  recommended to evaluate extent of disease. I have discussed the findings and recommendations with the patient. Results were also provided in writing at the conclusion of the visit. If applicable, a reminder letter will be sent to the patient regarding the next appointment. BI-RADS CATEGORY  4: Suspicious. Electronically Signed   By: Kristopher Oppenheim M.D.   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 LYMPH NODE, NEGATIVE FOR CARCINOMA of the Right axilla. This was found to be concordant by Dr. Lovey Newcomer. Pathology results were discussed with the patient by telephone. The patient reported doing well after the biopsies with tenderness at the sites. Post biopsy instructions and care were reviewed and questions were answered. The patient was encouraged to call [facility] for any additional concerns. The patient was referred to [facility] on November 06, 2018. Recommendation for a bilateral breast MRI due to family history and heterogeneously dense breasts. Pathology results reported by Terie Purser, RN on 10/31/2018. Electronically Signed   By: Lovey Newcomer M.D.   On: 10/31/2018 09:00   Result Date: 10/31/2018 CLINICAL DATA:  Cortically thickened right axillary lymph node. EXAM: [name] AXILLARY NODE CORE BIOPSY RIGHT COMPARISON:  Previous exam(s). FINDINGS: I met with the patient and we discussed the procedure of ultrasound-guided biopsy, including benefits and alternatives. We discussed the high likelihood of a successful procedure. We discussed the risks of the procedure, including infection, bleeding, tissue injury, clip migration, and inadequate sampling. Informed written consent was given. The usual time-out  protocol was performed immediately prior to the procedure. Using sterile technique and 1% Lidocaine as local anesthetic, under direct ultrasound visualization, a 14 gauge spring-loaded device was used to perform biopsy of cortically thickened right axillary lymph node using a lateral approach. At the conclusion of the procedure a HydroMARK tissue marker clip was deployed into the biopsy cavity. Follow up 2 view mammogram was performed and dictated separately. IMPRESSION: Ultrasound guided biopsy of right axillary lymph node. No apparent complications. Electronically Signed: By: Lovey Newcomer M.D. On: 10/29/2018 11:46   Mm Clip Placement Right  Result Date: 10/29/2018 CLINICAL DATA:  Patient status post ultrasound-guided biopsy right breast mass and right axillary lymph node. EXAM: DIAGNOSTIC RIGHT MAMMOGRAM POST ULTRASOUND BIOPSY COMPARISON:  Previous exam(s). FINDINGS: Mammographic images were obtained following ultrasound guided biopsy of right breast mass 8 o'clock position and right axillary lymph node. Right breast mass 8 o'clock position: Ribbon shaped marking clip: In appropriate position. Right axillary lymph node: HydroMARK clip: In appropriate position. IMPRESSION: Appropriate position biopsy marking clips status post ultrasound-guided biopsy right breast mass and right axillary lymph node. Final Assessment: Post Procedure Mammograms for Marker Placement Electronically Signed   By: Lovey Newcomer M.D.   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  LYMPH NODE, NEGATIVE FOR CARCINOMA of the Right axilla. This was found to be concordant by Dr. Lovey Newcomer. Pathology results were discussed with the patient by telephone. The patient reported doing well after the biopsies with  tenderness at the sites. Post biopsy instructions and care were reviewed and questions were answered. The patient was encouraged to call [facility] for any additional concerns. The patient was referred to [facility] on November 06, 2018. Recommendation for a bilateral breast MRI due to family history and heterogeneously dense breasts. Pathology results reported by Terie Purser, RN on 10/31/2018. Electronically Signed   By: Lovey Newcomer M.D.   On: 10/31/2018 08:57   Result Date: 10/31/2018 CLINICAL DATA:  Patient with indeterminate right breast mass 8 o'clock position. EXAM: ULTRASOUND GUIDED RIGHT BREAST CORE NEEDLE BIOPSY COMPARISON:  Previous exam(s). FINDINGS: I met with the patient and we discussed the procedure of ultrasound-guided biopsy, including benefits and alternatives. We discussed the high likelihood of a successful procedure. We discussed the risks of the procedure, including infection, bleeding, tissue injury, clip migration, and inadequate sampling. Informed written consent was given. The usual time-out protocol was performed immediately prior to the procedure. Lesion quadrant: Lower outer quadrant Using sterile technique and 1% Lidocaine as local anesthetic, under direct ultrasound visualization, a 12 gauge spring-loaded device was used to perform biopsy of right breast mass 8 o'clock position using a lateral approach. At the conclusion of the procedure a ribbon shaped tissue marker clip was deployed into the biopsy cavity. Follow up 2 view mammogram was performed and dictated separately. IMPRESSION: Ultrasound guided biopsy of right breast mass 8 o'clock position. No apparent complications. Electronically Signed: By: Lovey Newcomer M.D. On: 10/29/2018 11:45    ELIGIBLE FOR AVAILABLE RESEARCH PROTOCOL: Declined   ASSESSMENT: 48 y.o. [place] Parkins, [place] woman status post right breast lower outer  quadrant biopsy 10/29/2018 for a clinical T2 N0, stage IIB invasive ductal carcinoma, grade 3, triple negative, with an MIB-1 of 80%  (a) CT of the chest 11/15/2018 shows possible liver metastases, biopsy 11/25/2018  (b) bone scan 11/15/2018 shows no suspicious bone lesions  (1) neoadjuvant chemotherapy will consist of doxorubicin and cyclophosphamide in dose dense fashion x4, starting 11/26/2018 to be followed by weekly paclitaxel and carboplatin x12  (2) patient desires ovarian function preservation: Zoladex started 11/08/2018  (3) definitive surgery to follow chemo  (4) adjuvant radiation to follow  (5) genetics testing 11/28/2018    PLAN: Johnny tolerated her liver biopsy yesterday without any unusual events other than pain.  I gave her some additional tramadol in case that persists.  She knows to use bowel prophylaxis as appropriate.  Her schedule was not correct since we have had to change the treatment start date, which is today.  I have set her up for treatment every 2 weeks x 4 beginning today and visit again a week and 2 weeks from today.  We reviewed her supportive medication routing sheet and she has a good understanding of how to use it  She knows that we do not have the results of her liver biopsy but that should be available by the time she returns to see [name] a week from now.  She is not going to be returning to work until she is done with chemotherapy.  She does have FMLA papers being filled out and if they are done today we will make sure she gets them before  she leaves today.  We discussed the fact that she is going to be losing her hair in about 2 weeks.  She is planning to get a wig.  I have encouraged her to call us with any problems that may develop before the next visit.  , [name], MD  11/26/18 8:35 AM Medical Oncology and Hematology [facility] [address] [place], [place] [address] Tel. [phone]    Fax. [phone]   I,  Jacqualyn Posey am acting as a Education administrator for Chauncey Cruel, MD.   I, Lurline Del MD, have reviewed the above documentation for accuracy and completeness, and I agree with the above.

## 2018-11-25 NOTE — Consult Note (Signed)
Chief Complaint: Patient was seen in consultation today for image guided liver lesion biopsy  Referring Physician(s): Chauncey Cruel  Supervising Physician: Jacqulynn Cadet  Patient Status: [facility] - Out-pt  History of Present Illness: Brittany Rodgers is a 48 y.o. female with history of recently diagnosed right breast carcinoma and staging CT scan which has revealed right axillary lymphadenopathy as well as liver lesions. She presents today for image guided liver lesion biopsy for further evaluation.  Past Medical History:  Diagnosis Date  . Chronic cough   . GERD (gastroesophageal reflux disease)   . Hyperlipidemia   . IBS (irritable bowel syndrome)    sees Dr. Silvano Rusk   . Routine gynecological examination    sees Dr. Freda Munro    Past Surgical History:  Procedure Laterality Date  . CESAREAN SECTION    . PORTACATH PLACEMENT Right 11/14/2018   Procedure: INSERTION PORT-A-CATH WITH ULTRASOUND;  Surgeon: Rolm Bookbinder, MD;  Location: [place];  Service: General;  Laterality: Right;  . ROOT CANAL      Allergies: Barbiturates and Sulfonamide derivatives  Medications: Prior to Admission medications   Medication Sig Start Date End Date Taking? Authorizing Provider  atorvastatin (LIPITOR) 20 MG tablet Take 1 tablet (20 mg total) by mouth daily. 05/31/18  Yes [name], MD  cyclobenzaprine (FLEXERIL) 10 MG tablet TAKE 1 TABLET BY MOUTH THREE TIMES DAILY AS NEEDED FOR MUSCLE SPASMS 09/03/18  Yes [name], MD  loratadine (CLARITIN) 10 MG tablet Take 1 tablet (10 mg total) by mouth daily. 11/15/18  Yes Magrinat, [name], MD  Omeprazole-Sodium Bicarbonate (ZEGERID) 20-1100 MG CAPS capsule Take 1 capsule by mouth daily before breakfast. 05/31/18  Yes [name], MD  potassium chloride (K-DUR) 10 MEQ tablet TAKE 1 TABLET BY MOUTH ONCE DAILY 11/30/17  Yes [name], MD  dexamethasone (DECADRON) 4 MG tablet Take 2 tablets by mouth once a  day on the day after chemotherapy and then take 2 tablets two times a day for 2 days. Take with food. 11/15/18   Magrinat, [name], MD  lidocaine-prilocaine (EMLA) cream Apply to affected area once 11/15/18   Magrinat, [name], MD  LORazepam (ATIVAN) 0.5 MG tablet Take 1 tablet (0.5 mg total) by mouth at bedtime as needed (Nausea or vomiting). 11/15/18   Magrinat, [name], MD  polyethylene glycol (MIRALAX / GLYCOLAX) packet Take 17 g by mouth as needed. Patient taking differently: Take 17 g by mouth daily as needed (constipation). Mix in 8 oz liquid and drink 04/23/12   [name], MD  prochlorperazine (COMPAZINE) 10 MG tablet Take 1 tablet (10 mg total) by mouth every 6 (six) hours as needed (Nausea or vomiting). 11/15/18   Magrinat, [name], MD  traMADol (ULTRAM) 50 MG tablet Take 2 tablets (100 mg total) by mouth every 6 (six) hours as needed for moderate pain. 11/14/18   Rolm Bookbinder, MD     Family History  Problem Relation Age of Onset  . Hypertension Other   . Breast cancer Other   . Breast cancer Maternal Aunt     Social History   Socioeconomic History  . Marital status: Married    Spouse name: Not on file  . Number of children: Not on file  . Years of education: Not on file  . Highest education level: Not on file  Occupational History  . Not on file  Social Needs  . Financial resource strain: Not on file  .  Food insecurity:    Worry: Not on file    Inability: Not on file  . Transportation needs:    Medical: Not on file    Non-medical: Not on file  Tobacco Use  . Smoking status: Never Smoker  . Smokeless tobacco: Never Used  Substance and Sexual Activity  . Alcohol use: No    Alcohol/week: 0.0 standard drinks  . Drug use: No  . Sexual activity: Not on file  Lifestyle  . Physical activity:    Days per week: Not on file    Minutes per session: Not on file  . Stress: Not on file  Relationships  . Social connections:    Talks on phone: Not on file    Gets  together: Not on file    Attends religious service: Not on file    Active member of club or organization: Not on file    Attends meetings of clubs or organizations: Not on file    Relationship status: Not on file  Other Topics Concern  . Not on file  Social History Narrative  . Not on file    Review of Systems denies fever, substernal chest pain, worsening dyspnea, abdominal pain, nausea, vomiting or bleeding.  She does have occasional headaches, occasional cough, intermittent back pain as well as some tenderness at right chest wall Port-A-Cath site and breast region. She also c/o some weakness from recent viral infection.   Vital Signs: BP (!) 155/91   Pulse 88   Temp 98.1 F (36.7 C) (Oral)   Resp 16   LMP 11/12/2018   SpO2 100%   Physical Exam awake, alert.  Chest clear to auscultation bilaterally.  Clean, intact right chest wall Port-A-Cath site, slightly tender to palpation.  Heart with regular rate and rhythm.  Abdomen soft, positive bowel sounds, nontender.  No lower extremity edema.  Imaging: Ct Chest W Contrast  Result Date: 11/15/2018 CLINICAL DATA:  New diagnosis of right breast cancer, presenting for chest staging prior to chemotherapy. EXAM: CT CHEST WITH CONTRAST TECHNIQUE: Multidetector CT imaging of the chest was performed during intravenous contrast administration. CONTRAST:  43m OMNIPAQUE IOHEXOL 300 MG/ML  SOLN COMPARISON:  11/14/2018 chest radiograph. FINDINGS: Cardiovascular: Normal heart size. No significant pericardial effusion/thickening. Right internal jugular Port-A-Cath terminates at the cavoatrial junction. Expected subcutaneous emphysema surrounding Port-A-Cath given recent placement. Great vessels are normal in course and caliber. No central pulmonary emboli. Mediastinum/Nodes: No discrete thyroid nodules. Unremarkable esophagus. Enlarged 1.1 cm right axillary node (series 2/image 54). No left axillary adenopathy. No mediastinal or hilar adenopathy.  Lungs/Pleura: No pneumothorax. No pleural effusion. No acute consolidative airspace disease, lung masses or significant pulmonary nodules. Upper abdomen: There is a poorly marginated 4.9 x 3.1 cm heterogeneous hypodense mass at the posterior right liver margin (series 2/image 106), favor an exophytic liver mass, which abuts upper right kidney and right adrenal gland. There is partial visualization of a poorly marginated hypodense 1.8 x 1.7 cm segment 4B left liver lobe mass (series 2/image 128). Nonobstructing 3 mm upper right renal stone. Musculoskeletal: No aggressive appearing focal osseous lesions. Heterogeneously enhancing posterior right breast 5.6 x 4.5 cm mass (series 2/image 80). IMPRESSION: 1. Posterior right breast 5.6 cm mass compatible with primary right breast malignancy. 2. Right axillary lymphadenopathy compatible with nodal metastatic disease. 3. Two poorly marginated hypodense liver masses, indeterminate, suspicious for liver metastases. Further staging evaluation with CT abdomen/pelvis with oral and IV contrast suggested. PET-CT or MRI abdomen without and with IV contrast may  also be considered, as clinically warranted. 4. No pulmonary nodules. Electronically Signed   By: Ilona Sorrel M.D.   On: 11/15/2018 12:55   Nm Bone Scan Whole Body  Result Date: 11/15/2018 CLINICAL DATA:  Invasive breast cancer EXAM: NUCLEAR MEDICINE WHOLE BODY BONE SCAN TECHNIQUE: Whole body anterior and posterior images were obtained approximately 3 hours after intravenous injection of radiopharmaceutical. RADIOPHARMACEUTICALS:  22 mCi Technetium-57mMDP IV COMPARISON:  None Radiographic correlation: CT chest 11/15/2018 FINDINGS: Minimal uptake of 12 tracer at the shoulders, elbows, sternoclavicular joints, typically degenerative. No additional sites of abnormal osseous tracer accumulation are identified. Slight rotation of the pelvis. Expected urinary tract and soft tissue distribution of tracer. IMPRESSION: No  scintigraphic evidence of osseous metastatic disease. Electronically Signed   By: MLavonia DanaM.D.   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 A borderline abnormal right axillary lymph node was biopsied with no evidence of malignancy. LABS:  None obtained on site today. EXAM: BILATERAL BREAST MRI WITH AND WITHOUT CONTRAST TECHNIQUE: Multiplanar, multisequence MR images of both breasts were obtained prior to and following the intravenous administration of 7 ml of Gadavist Three-dimensional MR images were rendered by post-processing of the original MR data on an independent workstation. The three-dimensional MR images were interpreted, and findings are reported in the following complete MRI report for this study. Three dimensional images were evaluated at the independent DynaCad workstation COMPARISON:  Current and previous mammogram, ultrasound and biopsy examinations. FINDINGS: Breast composition: b. Scattered fibroglandular tissue. Background parenchymal enhancement: Moderate. Right breast: 5.4 x 4.6 x 3.9 cm oval mass with a mildly to moderately thickened rind of peripheral enhancing soft tissue and larger, more confluent irregular enhancing soft tissue component posteriorly in the lower outer quadrant of the right breast. This corresponds to the recently biopsied malignancy. This is abutting the underlying pectoralis muscle and extending into the anterior aspect of the muscle with abnormal enhancement. The mass has a mixture of enhancement kinetics, including a small amount of rapid wash-in/washout kinetics and larger amount of persistent and plateau kinetics. This masses within a large area of non mass enhancement involving the lower outer quadrant and lower inner quadrant of the right breast and extending  from the far anterior aspect of the breast to the far posterior aspect of the breast. This also has some extension into the upper outer and upper inner quadrants and, with the biopsied mass, involve an area measuring 9.6 x 6.8 x 4.9 cm. This has predominantly persistent enhancement kinetics with a small amount of plateau kinetics. Left breast: No mass or abnormal enhancement. Lymph nodes: There is 1 abnormally enlarged right axillary lymph node with abnormal cortical thickening, suspicious for a metastatic node. This corresponds to the recently biopsied node with benign results and contains a biopsy marker artifact. There is a prominent internal mammary vein on the right with no enlarged internal mammary lymph nodes seen. Ancillary findings:  None. IMPRESSION: 1. 5.4 x 4.6 x 3.9 cm biopsy-proven malignancy in the posterior aspect of the lower outer quadrant of the right breast. This is invading the anterior aspect of the underlying pectoralis muscle and has a significant necrotic component. 2. Additional non mass enhancement involving all 4 quadrants of the right breast, extending from the anterior to the posterior aspect of the breast.  The non mass enhancement and biopsy-proven malignancy involve a combined area measuring 9.6 x 6.8 x 4.9 cm. The non mass enhancement is concerning four additional ductal carcinoma in situ. 3. Single abnormal appearing right axillary lymph node, corresponding to the recently biopsied lymph node with benign results. Despite the recent benign biopsy results, this is suspicious for a possible metastatic node. 4. No evidence of malignancy the on the left. RECOMMENDATION: 6: Treatment plan. BI-RADS CATEGORY  6: Known biopsy-proven malignancy. Electronically Signed   By: Claudie Revering M.D.   On: 11/12/2018 16:06   Dg Chest Port 1 View  Result Date: 11/14/2018 CLINICAL DATA:  Port-A-Cath placement. EXAM: PORTABLE CHEST 1 VIEW COMPARISON:  06/09/2008 FINDINGS: Right IJ approach central  venous catheter terminates in the expected location of the cavoatrial junction. Cardiomediastinal silhouette is normal. Mediastinal contours appear intact. There is no evidence of focal airspace consolidation, pleural effusion or pneumothorax. Osseous structures are without acute abnormality. Soft tissues are grossly normal. IMPRESSION: Right IJ approach central venous catheter terminates in the expected location of the cavoatrial junction. No evidence of pneumothorax. Electronically Signed   By: Fidela Salisbury M.D.   On: 11/14/2018 12:39   Dg Fluoro Guide Cv Line-no Report  Result Date: 11/14/2018 Fluoroscopy was utilized by the requesting physician.  No radiographic interpretation.   US Breast Ltd Uni Right Inc Axilla  Result Date: 10/28/2018 CLINICAL DATA:  48 year old female with a palpable right breast lump and associated tenderness for approximately 1 week. EXAM: DIGITAL DIAGNOSTIC RIGHT MAMMOGRAM WITH CAD AND TOMO ULTRASOUND RIGHT BREAST COMPARISON:  Previous exam(s). ACR Breast Density Category c: The breast tissue is heterogeneously dense, which may obscure small masses. FINDINGS: Radiopaque BB was placed at the site of the patient's palpable lump in the central right breast. An irregular hyperdense mass with associated increased trabecular thickening and is seen in the deep central right breast. Further evaluation with ultrasound was performed. Mammographic images were processed with CAD. On physical exam, I palpate a firm, fixed mass in the central right breast. Targeted ultrasound is performed, showing an irregular hypoechoic mass at the 8 o'clock position 3 cm from the nipple. It measures 3.6 x 2.9 x 2.3 cm. There is associated peripheral vascularity. Evaluation of the right axilla demonstrates a single prominent lymph node demonstrating 4 mm cortical thickening. The remaining lymph nodes have a normal appearance with a thin, symmetric cortex. IMPRESSION: 1. Suspicious right breast mass  corresponding with the patient's palpable lump. 2. Indeterminate right axillary lymph node demonstrating up to 4 mm cortical thickening. RECOMMENDATION: Two area ultrasound-guided biopsy of the right breast and right axilla. If findings demonstrate malignancy, further evaluation with bilateral contrast-enhanced breast MRI is recommended to evaluate extent of disease. I have discussed the findings and recommendations with the patient. Results were also provided in writing at the conclusion of the visit. If applicable, a reminder letter will be sent to the patient regarding the next appointment. BI-RADS CATEGORY  4: Suspicious. Electronically Signed   By: Kristopher Oppenheim M.D.   On: 10/28/2018 13:30   Mm Diag Breast Tomo Uni Right  Result Date: 10/28/2018 CLINICAL DATA:  48 year old female with a palpable right breast lump and associated tenderness for approximately 1 week. EXAM: DIGITAL DIAGNOSTIC RIGHT MAMMOGRAM WITH CAD AND TOMO ULTRASOUND RIGHT BREAST COMPARISON:  Previous exam(s). ACR Breast Density Category c: The breast tissue is heterogeneously dense, which may obscure small masses. FINDINGS: Radiopaque BB was placed at the site of the patient's palpable lump in the  central right breast. An irregular hyperdense mass with associated increased trabecular thickening and is seen in the deep central right breast. Further evaluation with ultrasound was performed. Mammographic images were processed with CAD. On physical exam, I palpate a firm, fixed mass in the central right breast. Targeted ultrasound is performed, showing an irregular hypoechoic mass at the 8 o'clock position 3 cm from the nipple. It measures 3.6 x 2.9 x 2.3 cm. There is associated peripheral vascularity. Evaluation of the right axilla demonstrates a single prominent lymph node demonstrating 4 mm cortical thickening. The remaining lymph nodes have a normal appearance with a thin, symmetric cortex. IMPRESSION: 1. Suspicious right breast mass  corresponding with the patient's palpable lump. 2. Indeterminate right axillary lymph node demonstrating up to 4 mm cortical thickening. RECOMMENDATION: Two area ultrasound-guided biopsy of the right breast and right axilla. If findings demonstrate malignancy, further evaluation with bilateral contrast-enhanced breast MRI is recommended to evaluate extent of disease. I have discussed the findings and recommendations with the patient. Results were also provided in writing at the conclusion of the visit. If applicable, a reminder letter will be sent to the patient regarding the next appointment. BI-RADS CATEGORY  4: Suspicious. Electronically Signed   By: Kristopher Oppenheim M.D.   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 LYMPH NODE, NEGATIVE FOR CARCINOMA of the Right axilla. This was found to be concordant by Dr. Lovey Newcomer. Pathology results were discussed with the patient by telephone. The patient reported doing well after the biopsies with tenderness at the sites. Post biopsy instructions and care were reviewed and questions were answered. The patient was encouraged to call [facility] for any additional concerns. The patient was referred to [facility] on November 06, 2018. Recommendation for a bilateral breast MRI due to family history and heterogeneously dense breasts. Pathology results reported by Terie Purser, RN on 10/31/2018. Electronically Signed   By: Lovey Newcomer M.D.   On: 10/31/2018 09:00   Result Date: 10/31/2018 CLINICAL DATA:  Cortically thickened right axillary lymph node. EXAM: [name] AXILLARY NODE CORE BIOPSY RIGHT COMPARISON:  Previous exam(s). FINDINGS: I met with the patient and we  discussed the procedure of ultrasound-guided biopsy, including benefits and alternatives. We discussed the high likelihood of a successful procedure. We discussed the risks of the procedure, including infection, bleeding, tissue injury, clip migration, and inadequate sampling. Informed written consent was given. The usual time-out protocol was performed immediately prior to the procedure. Using sterile technique and 1% Lidocaine as local anesthetic, under direct ultrasound visualization, a 14 gauge spring-loaded device was used to perform biopsy of cortically thickened right axillary lymph node using a lateral approach. At the conclusion of the procedure a HydroMARK tissue marker clip was deployed into the biopsy cavity. Follow up 2 view mammogram was performed and dictated separately. IMPRESSION: Ultrasound guided biopsy of right axillary lymph node. No apparent complications. Electronically Signed: By: Lovey Newcomer M.D. On: 10/29/2018 11:46   Mm Clip Placement Right  Result Date: 10/29/2018 CLINICAL DATA:  Patient status post ultrasound-guided biopsy right breast mass and right axillary lymph node. EXAM: DIAGNOSTIC RIGHT MAMMOGRAM POST ULTRASOUND BIOPSY COMPARISON:  Previous exam(s). FINDINGS: Mammographic images were obtained following ultrasound guided biopsy of  right breast mass 8 o'clock position and right axillary lymph node. Right breast mass 8 o'clock position: Ribbon shaped marking clip: In appropriate position. Right axillary lymph node: HydroMARK clip: In appropriate position. IMPRESSION: Appropriate position biopsy marking clips status post ultrasound-guided biopsy right breast mass and right axillary lymph node. Final Assessment: Post Procedure Mammograms for Marker Placement Electronically Signed   By: Lovey Newcomer M.D.   On: 10/29/2018 11:47   [name] Rt Breast Bx W Loc Dev 1st Lesion Img Bx Spec US Guide  Addendum Date: 10/31/2018   ADDENDUM REPORT: 10/31/2018 08:57 ADDENDUM: Pathology revealed GRADE  III INVASIVE DUCTAL CARCINOMA, HIGH GRADE DUCTAL CARCINOMA IN SITU of the Right breast, 8 o'clock. LYMPH NODE, NEGATIVE FOR CARCINOMA of the Right axilla. This was found to be concordant by Dr. Lovey Newcomer. Pathology results were discussed with the patient by telephone. The patient reported doing well after the biopsies with tenderness at the sites. Post biopsy instructions and care were reviewed and questions were answered. The patient was encouraged to call [facility] for any additional concerns. The patient was referred to [facility] on November 06, 2018. Recommendation for a bilateral breast MRI due to family history and heterogeneously dense breasts. Pathology results reported by Terie Purser, RN on 10/31/2018. Electronically Signed   By: Lovey Newcomer M.D.   On: 10/31/2018 08:57   Result Date: 10/31/2018 CLINICAL DATA:  Patient with indeterminate right breast mass 8 o'clock position. EXAM: ULTRASOUND GUIDED RIGHT BREAST CORE NEEDLE BIOPSY COMPARISON:  Previous exam(s). FINDINGS: I met with the patient and we discussed the procedure of ultrasound-guided biopsy, including benefits and alternatives. We discussed the high likelihood of a successful procedure. We discussed the risks of the procedure, including infection, bleeding, tissue injury, clip migration, and inadequate sampling. Informed written consent was given. The usual time-out protocol was performed immediately prior to the procedure. Lesion quadrant: Lower outer quadrant Using sterile technique and 1% Lidocaine as local anesthetic, under direct ultrasound visualization, a 12 gauge spring-loaded device was used to perform biopsy of right breast mass 8 o'clock position using a lateral approach. At the conclusion of the procedure a ribbon shaped tissue marker clip was deployed into the biopsy cavity. Follow up 2 view mammogram was performed and dictated  separately. IMPRESSION: Ultrasound guided biopsy of right breast mass 8 o'clock position. No apparent complications. Electronically Signed: By: Lovey Newcomer M.D. On: 10/29/2018 11:45    Labs:  CBC: Recent Labs    11/06/18 0821 11/25/18 1130  WBC 4.3 9.2  HGB 13.4 12.8  HCT 41.5 40.4  PLT 392 326    COAGS: No results for input(s): INR, APTT in the last 8760 hours.  BMP: Recent Labs    11/06/18 0821  NA 140  K 4.1  CL 103  CO2 28  GLUCOSE 93  BUN 11  CALCIUM 9.3  CREATININE 0.86  GFRNONAA >60  GFRAA >60    LIVER FUNCTION TESTS: Recent Labs    11/06/18 0821  BILITOT 0.5  AST 11*  ALT 25  ALKPHOS 100  PROT 8.0  ALBUMIN 4.4    TUMOR MARKERS: No results for input(s): AFPTM, CEA, CA199, CHROMGRNA in the last 8760 hours.  Assessment and Plan: 48 y.o. female with history of recently diagnosed right breast carcinoma and staging CT scan which has revealed right axillary lymphadenopathy as well as liver lesions. She presents today for image guided liver lesion biopsy  for further evaluation.Risks and benefits of procedure was discussed with the patient and/or patient's family including, but not limited to bleeding, infection, damage to adjacent structures or low yield requiring additional tests.  All of the questions were answered and there is agreement to proceed.  Consent signed and in chart.  LABS PENDING   Thank you for this interesting consult.  I greatly enjoyed meeting Brittany Rodgers and look forward to participating in their care.  A copy of this report was sent to the requesting provider on this date.  Electronically Signed: D. Rowe Robert, PA-C 11/25/2018, 11:54 AM   I spent a total of 25 minutes in face to face in clinical consultation, greater than 50% of which was counseling/coordinating care for image guided liver lesion biopsy

## 2018-11-25 NOTE — Discharge Instructions (Signed)
Liver Biopsy, Care After °These instructions give you information on caring for yourself after your procedure. Your doctor may also give you more specific instructions. Call your doctor if you have any problems or questions after your procedure. °What can I expect after the procedure? °After the procedure, it is common to have: °· Pain and soreness where the biopsy was done. °· Bruising around the area where the biopsy was done. °· Sleepiness and be tired for a few days. °Follow these instructions at home: °Medicines °· Take over-the-counter and prescription medicines only as told by your doctor. °· If you were prescribed an antibiotic medicine, take it as told by your doctor. Do not stop taking the antibiotic even if you start to feel better. °· Do not take medicines such as aspirin and ibuprofen. These medicines can thin your blood. Do not take these medicines unless your doctor tells you to take them. °· If you are taking prescription pain medicine, take actions to prevent or treat constipation. Your doctor may recommend that you: °? Drink enough fluid to keep your pee (urine) clear or pale yellow. °? Take over-the-counter or prescription medicines. °? Eat foods that are high in fiber, such as fresh fruits and vegetables, whole grains, and beans. °? Limit foods that are high in fat and processed sugars, such as fried and sweet foods. °Caring for your cut °· Follow instructions from your doctor about how to take care of your cuts from surgery (incisions). Make sure you: °? Wash your hands with soap and water before you change your bandage (dressing). If you cannot use soap and water, use hand sanitizer. °? Change your bandage as told by your doctor. °? Leave stitches (sutures), skin glue, or skin tape (adhesive) strips in place. They may need to stay in place for 2 weeks or longer. If tape strips get loose and curl up, you may trim the loose edges. Do not remove tape strips completely unless your doctor says it is  okay. °· Check your cuts every day for signs of infection. Check for: °? Redness, swelling, or more pain. °? Fluid or blood. °? Pus or a bad smell. °? Warmth. °· Do not take baths, swim, or use a hot tub until your doctor says it is okay to do so. °Activity ° °· Rest at home for 1-2 days or as told by your doctor. °? Avoid sitting for a long time without moving. Get up to take Cassin walks every 1-2 hours. °· Return to your normal activities as told by your doctor. Ask what activities are safe for you. °· Do not do these things in the first 24 hours: °? Drive. °? Use machinery. °? Take a bath or shower. °· Do not lift more than 10 pounds (4.5 kg) or play contact sports for the first 2 weeks. °General instructions ° °· Do not drink alcohol in the first week after the procedure. °· Have someone stay with you for at least 24 hours after the procedure. °· Get your test results. Ask your doctor or the department that is doing the test: °? When will my results be ready? °? How will I get my results? °? What are my treatment options? °? What other tests do I need? °? What are my next steps? °· Keep all follow-up visits as told by your doctor. This is important. °Contact a doctor if: °· A cut bleeds and leaves more than just a small spot of blood. °· A cut is red, puffs up (  swells), or hurts more than before. °· Fluid or something else comes from a cut. °· A cut smells bad. °· You have a fever or chills. °Get help right away if: °· You have swelling, bloating, or pain in your belly (abdomen). °· You get dizzy or faint. °· You have a rash. °· You feel sick to your stomach (nauseous) or throw up (vomit). °· You have trouble breathing, feel Ryle of breath, or feel faint. °· Your chest hurts. °· You have problems talking or seeing. °· You have trouble with your balance or moving your arms or legs. °Summary °· After the procedure, it is common to have pain, soreness, bruising, and tiredness. °· Your doctor will tell you how to  take care of yourself at home. Change your bandage, take your medicines, and limit your activities as told by your doctor. °· Call your doctor if you have symptoms of infection. Get help right away if your belly swells, your cut bleeds a lot, or you have trouble talking or breathing. °This information is not intended to replace advice given to you by your health care provider. Make sure you discuss any questions you have with your health care provider. °Document Released: 06/20/2008 Document Revised: 09/21/2017 Document Reviewed: 09/21/2017 °Elsevier Interactive Patient Education © 2019 [employer] °Moderate Conscious Sedation, Adult, Care After °These instructions provide you with information about caring for yourself after your procedure. Your health care provider may also give you more specific instructions. Your treatment has been planned according to current medical practices, but problems sometimes occur. Call your health care provider if you have any problems or questions after your procedure. °What can I expect after the procedure? °After your procedure, it is common: °· To feel sleepy for several hours. °· To feel clumsy and have poor balance for several hours. °· To have poor judgment for several hours. °· To vomit if you eat too soon. °Follow these instructions at home: °For at least 24 hours after the procedure: ° °· Do not: °? Participate in activities where you could fall or become injured. °? Drive. °? Use heavy machinery. °? Drink alcohol. °? Take sleeping pills or medicines that cause drowsiness. °? Make important decisions or sign legal documents. °? Take care of children on your own. °· Rest. °Eating and drinking °· Follow the diet recommended by your health care provider. °· If you vomit: °? Drink water, juice, or soup when you can drink without vomiting. °? Make sure you have little or no nausea before eating solid foods. °General instructions °· Have a responsible adult stay with you until  you are awake and alert. °· Take over-the-counter and prescription medicines only as told by your health care provider. °· If you smoke, do not smoke without supervision. °· Keep all follow-up visits as told by your health care provider. This is important. °Contact a health care provider if: °· You keep feeling nauseous or you keep vomiting. °· You feel light-headed. °· You develop a rash. °· You have a fever. °Get help right away if: °· You have trouble breathing. °This information is not intended to replace advice given to you by your health care provider. Make sure you discuss any questions you have with your health care provider. °Document Released: 07/02/2013 Document Revised: 02/14/2016 Document Reviewed: 01/01/2016 °Elsevier Interactive Patient Education © 2019 [employer] ° °

## 2018-11-25 NOTE — Procedures (Signed)
Interventional Radiology Procedure Note  Procedure: US guided core biopsy of liver lesion.   Complications: None immediate.   Estimated Blood Loss: None.   Recommendations: - Path pending - Bedrest x 3 hrs then dc home  Signed,  Criselda Peaches, MD

## 2018-11-26 ENCOUNTER — Other Ambulatory Visit: Payer: Self-pay | Admitting: Oncology

## 2018-11-26 ENCOUNTER — Inpatient Hospital Stay (HOSPITAL_BASED_OUTPATIENT_CLINIC_OR_DEPARTMENT_OTHER): Payer: BLUE CROSS/BLUE SHIELD | Admitting: Oncology

## 2018-11-26 ENCOUNTER — Inpatient Hospital Stay: Payer: BLUE CROSS/BLUE SHIELD

## 2018-11-26 ENCOUNTER — Inpatient Hospital Stay: Payer: BLUE CROSS/BLUE SHIELD | Attending: Oncology

## 2018-11-26 ENCOUNTER — Telehealth: Payer: Self-pay | Admitting: Oncology

## 2018-11-26 ENCOUNTER — Encounter: Payer: Self-pay | Admitting: *Deleted

## 2018-11-26 VITALS — BP 134/63 | HR 89 | Temp 98.9°F | Resp 18 | Wt 160.1 lb

## 2018-11-26 DIAGNOSIS — C50511 Malignant neoplasm of lower-outer quadrant of right female breast: Secondary | ICD-10-CM

## 2018-11-26 DIAGNOSIS — Z5111 Encounter for antineoplastic chemotherapy: Secondary | ICD-10-CM | POA: Diagnosis not present

## 2018-11-26 DIAGNOSIS — Z803 Family history of malignant neoplasm of breast: Secondary | ICD-10-CM

## 2018-11-26 DIAGNOSIS — Z7689 Persons encountering health services in other specified circumstances: Secondary | ICD-10-CM | POA: Diagnosis not present

## 2018-11-26 DIAGNOSIS — R590 Localized enlarged lymph nodes: Secondary | ICD-10-CM | POA: Diagnosis not present

## 2018-11-26 DIAGNOSIS — K589 Irritable bowel syndrome without diarrhea: Secondary | ICD-10-CM | POA: Insufficient documentation

## 2018-11-26 DIAGNOSIS — K5900 Constipation, unspecified: Secondary | ICD-10-CM | POA: Diagnosis not present

## 2018-11-26 DIAGNOSIS — R160 Hepatomegaly, not elsewhere classified: Secondary | ICD-10-CM | POA: Insufficient documentation

## 2018-11-26 DIAGNOSIS — R110 Nausea: Secondary | ICD-10-CM | POA: Diagnosis not present

## 2018-11-26 DIAGNOSIS — N200 Calculus of kidney: Secondary | ICD-10-CM | POA: Insufficient documentation

## 2018-11-26 DIAGNOSIS — G479 Sleep disorder, unspecified: Secondary | ICD-10-CM | POA: Diagnosis not present

## 2018-11-26 DIAGNOSIS — K219 Gastro-esophageal reflux disease without esophagitis: Secondary | ICD-10-CM

## 2018-11-26 DIAGNOSIS — Z171 Estrogen receptor negative status [ER-]: Principal | ICD-10-CM

## 2018-11-26 DIAGNOSIS — E785 Hyperlipidemia, unspecified: Secondary | ICD-10-CM

## 2018-11-26 DIAGNOSIS — R05 Cough: Secondary | ICD-10-CM

## 2018-11-26 DIAGNOSIS — Z79899 Other long term (current) drug therapy: Secondary | ICD-10-CM

## 2018-11-26 LAB — COMPREHENSIVE METABOLIC PANEL
ALT: 27 U/L (ref 0–44)
AST: 11 U/L — AB (ref 15–41)
Albumin: 3.9 g/dL (ref 3.5–5.0)
Alkaline Phosphatase: 110 U/L (ref 38–126)
Anion gap: 11 (ref 5–15)
BUN: 12 mg/dL (ref 6–20)
CHLORIDE: 103 mmol/L (ref 98–111)
CO2: 26 mmol/L (ref 22–32)
Calcium: 9.2 mg/dL (ref 8.9–10.3)
Creatinine, Ser: 0.8 mg/dL (ref 0.44–1.00)
GFR calc Af Amer: >60 mL/min (ref >60)
GFR calc non Af Amer: >60 mL/min (ref >60)
Glucose, Bld: 110 mg/dL — ABNORMAL HIGH (ref 70–99)
Potassium: 4 mmol/L (ref 3.5–5.1)
Sodium: 140 mmol/L (ref 135–145)
Total Bilirubin: 0.6 mg/dL (ref 0.3–1.2)
Total Protein: 7.8 g/dL (ref 6.5–8.1)

## 2018-11-26 LAB — CBC WITH DIFFERENTIAL/PLATELET
ABS IMMATURE GRANULOCYTES: 0.02 10*3/uL (ref 0.00–0.07)
Basophils Absolute: 0 10*3/uL (ref 0.0–0.1)
Basophils Relative: 0 %
Eosinophils Absolute: 0.1 10*3/uL (ref 0.0–0.5)
Eosinophils Relative: 1 %
HCT: 38.6 % (ref 36.0–46.0)
Hemoglobin: 12.4 g/dL (ref 12.0–15.0)
IMMATURE GRANULOCYTES: 0 %
Lymphocytes Relative: 15 %
Lymphs Abs: 1.6 10*3/uL (ref 0.7–4.0)
MCH: 28.6 pg (ref 26.0–34.0)
MCHC: 32.1 g/dL (ref 30.0–36.0)
MCV: 89.1 fL (ref 80.0–100.0)
MONOS PCT: 3 %
Monocytes Absolute: 0.3 10*3/uL (ref 0.1–1.0)
NEUTROS ABS: 9.1 10*3/uL — AB (ref 1.7–7.7)
NEUTROS PCT: 81 %
Platelets: 300 10*3/uL (ref 150–400)
RBC: 4.33 MIL/uL (ref 3.87–5.11)
RDW: 12.4 % (ref 11.5–15.5)
WBC: 11.2 10*3/uL — ABNORMAL HIGH (ref 4.0–10.5)
nRBC: 0 % (ref 0.0–0.2)

## 2018-11-26 MED ORDER — PALONOSETRON HCL INJECTION 0.25 MG/5ML
INTRAVENOUS | Status: AC
  Filled 2018-11-26: qty 5

## 2018-11-26 MED ORDER — SODIUM CHLORIDE 0.9 % IV SOLN
Freq: Once | INTRAVENOUS | Status: AC
  Administered 2018-11-26: 10:00:00 via INTRAVENOUS
  Filled 2018-11-26: qty 250

## 2018-11-26 MED ORDER — SODIUM CHLORIDE 0.9% FLUSH
10.0000 mL | INTRAVENOUS | Status: DC | PRN
  Administered 2018-11-26: 10 mL
  Filled 2018-11-26: qty 10

## 2018-11-26 MED ORDER — DOXORUBICIN HCL CHEMO IV INJECTION 2 MG/ML
60.0000 mg/m2 | Freq: Once | INTRAVENOUS | Status: AC
  Administered 2018-11-26: 106 mg via INTRAVENOUS
  Filled 2018-11-26: qty 53

## 2018-11-26 MED ORDER — HEPARIN SOD (PORK) LOCK FLUSH 100 UNIT/ML IV SOLN
500.0000 [IU] | Freq: Once | INTRAVENOUS | Status: AC | PRN
  Administered 2018-11-26: 500 [IU]
  Filled 2018-11-26: qty 5

## 2018-11-26 MED ORDER — SODIUM CHLORIDE 0.9 % IV SOLN
Freq: Once | INTRAVENOUS | Status: AC
  Administered 2018-11-26: 10:00:00 via INTRAVENOUS
  Filled 2018-11-26: qty 5

## 2018-11-26 MED ORDER — PALONOSETRON HCL INJECTION 0.25 MG/5ML
0.2500 mg | Freq: Once | INTRAVENOUS | Status: AC
  Administered 2018-11-26: 0.25 mg via INTRAVENOUS

## 2018-11-26 MED ORDER — TRAMADOL HCL 50 MG PO TABS: 50.0000 mg | ORAL_TABLET | Freq: Four times a day (QID) | ORAL | 0 refills | Status: DC | PRN

## 2018-11-26 MED ORDER — SODIUM CHLORIDE 0.9 % IV SOLN
600.0000 mg/m2 | Freq: Once | INTRAVENOUS | Status: AC
  Administered 2018-11-26: 1060 mg via INTRAVENOUS
  Filled 2018-11-26: qty 53

## 2018-11-26 NOTE — Patient Instructions (Signed)
[facility] Discharge Instructions for Patients Receiving Chemotherapy  Today you received the following chemotherapy agents: doxorubicin (Adriamycin) and cyclophosphamide (Cytoxan).   To help prevent nausea and vomiting after your treatment, we encourage you to take your nausea medication as prescribed by your physician.    If you develop nausea and vomiting that is not controlled by your nausea medication, call the clinic.   BELOW ARE SYMPTOMS THAT SHOULD BE REPORTED IMMEDIATELY:  *FEVER GREATER THAN 100.5 F  *CHILLS WITH OR WITHOUT FEVER  NAUSEA AND VOMITING THAT IS NOT CONTROLLED WITH YOUR NAUSEA MEDICATION  *UNUSUAL SHORTNESS OF BREATH  *UNUSUAL BRUISING OR BLEEDING  TENDERNESS IN MOUTH AND THROAT WITH OR WITHOUT PRESENCE OF ULCERS  *URINARY PROBLEMS  *BOWEL PROBLEMS  UNUSUAL RASH Items with * indicate a potential emergency and should be followed up as soon as possible.  Feel free to call the clinic should you have any questions or concerns. The clinic phone number is [phone].  Please show the CHEMO ALERT CARD at check-in to the Emergency Department and triage nurse.  Doxorubicin injection What is this medicine? DOXORUBICIN (dox oh ROO bi sin) is a chemotherapy drug. It is used to treat many kinds of cancer like leukemia, lymphoma, neuroblastoma, sarcoma, and Wilms' tumor. It is also used to treat bladder cancer, breast cancer, lung cancer, ovarian cancer, stomach cancer, and thyroid cancer. This medicine may be used for other purposes; ask your health care provider or pharmacist if you have questions. COMMON BRAND NAME(S): Adriamycin, Adriamycin PFS, Adriamycin RDF, Rubex What should I tell my health care provider before I take this medicine? They need to know if you have any of these conditions: -heart disease -history of low blood counts caused by a medicine -liver disease -recent or ongoing radiation therapy -an unusual or allergic reaction  to doxorubicin, other chemotherapy agents, other medicines, foods, dyes, or preservatives -pregnant or trying to get pregnant -breast-feeding How should I use this medicine? This drug is given as an infusion into a vein. It is administered in a hospital or clinic by a specially trained health care professional. If you have pain, swelling, burning or any unusual feeling around the site of your injection, tell your health care professional right away. Talk to your pediatrician regarding the use of this medicine in children. Special care may be needed. Overdosage: If you think you have taken too much of this medicine contact a poison control center or emergency room at once. NOTE: This medicine is only for you. Do not share this medicine with others. What if I miss a dose? It is important not to miss your dose. Call your doctor or health care professional if you are unable to keep an appointment. What may interact with this medicine? This medicine may interact with the following medications: -6-mercaptopurine -paclitaxel -phenytoin -St. John's Wort -trastuzumab -verapamil This list may not describe all possible interactions. Give your health care provider a list of all the medicines, herbs, non-prescription drugs, or dietary supplements you use. Also tell them if you smoke, drink alcohol, or use illegal drugs. Some items may interact with your medicine. What should I watch for while using this medicine? This drug may make you feel generally unwell. This is not uncommon, as chemotherapy can affect healthy cells as well as cancer cells. Report any side effects. Continue your course of treatment even though you feel ill unless your doctor tells you to stop. There is a maximum amount of this medicine you should receive   throughout your life. The amount depends on the medical condition being treated and your overall health. Your doctor will watch how much of this medicine you receive in your lifetime.  Tell your doctor if you have taken this medicine before. You may need blood work done while you are taking this medicine. Your urine may turn red for a few days after your dose. This is not blood. If your urine is dark or brown, call your doctor. In some cases, you may be given additional medicines to help with side effects. Follow all directions for their use. Call your doctor or health care professional for advice if you get a fever, chills or sore throat, or other symptoms of a cold or flu. Do not treat yourself. This drug decreases your body's ability to fight infections. Try to avoid being around people who are sick. This medicine may increase your risk to bruise or bleed. Call your doctor or health care professional if you notice any unusual bleeding. Talk to your doctor about your risk of cancer. You may be more at risk for certain types of cancers if you take this medicine. Do not become pregnant while taking this medicine or for 6 months after stopping it. Women should inform their doctor if they wish to become pregnant or think they might be pregnant. Men should not father a child while taking this medicine and for 6 months after stopping it. There is a potential for serious side effects to an unborn child. Talk to your health care professional or pharmacist for more information. Do not breast-feed an infant while taking this medicine. This medicine has caused ovarian failure in some women and reduced sperm counts in some men This medicine may interfere with the ability to have a child. Talk with your doctor or health care professional if you are concerned about your fertility. This medicine may cause a decrease in Co-Enzyme Q-10. You should make sure that you get enough Co-Enzyme Q-10 while you are taking this medicine. Discuss the foods you eat and the vitamins you take with your health care professional. What side effects may I notice from receiving this medicine? Side effects that you should  report to your doctor or health care professional as soon as possible: -allergic reactions like skin rash, itching or hives, swelling of the face, lips, or tongue -breathing problems -chest pain -fast or irregular heartbeat -low blood counts - this medicine may decrease the number of white blood cells, red blood cells and platelets. You may be at increased risk for infections and bleeding. -pain, redness, or irritation at site where injected -signs of infection - fever or chills, cough, sore throat, pain or difficulty passing urine -signs of decreased platelets or bleeding - bruising, pinpoint red spots on the skin, black, tarry stools, blood in the urine -swelling of the ankles, feet, hands -tiredness -weakness Side effects that usually do not require medical attention (report to your doctor or health care professional if they continue or are bothersome): -diarrhea -hair loss -mouth sores -nail discoloration or damage -nausea -red colored urine -vomiting This list may not describe all possible side effects. Call your doctor for medical advice about side effects. You may report side effects to FDA at 1-800-FDA-1088. Where should I keep my medicine? This drug is given in a hospital or clinic and will not be stored at home. NOTE: This sheet is a summary. It may not cover all possible information. If you have questions about this medicine, talk to your doctor,   pharmacist, or health care provider.  2019 Elsevier/Gold Standard (2017-04-25 11:01:26)  Cyclophosphamide injection What is this medicine? CYCLOPHOSPHAMIDE (sye kloe FOSS fa mide) is a chemotherapy drug. It slows the growth of cancer cells. This medicine is used to treat many types of cancer like lymphoma, myeloma, leukemia, breast cancer, and ovarian cancer, to name a few. This medicine may be used for other purposes; ask your health care provider or pharmacist if you have questions. COMMON BRAND NAME(S): Cytoxan, Neosar What  should I tell my health care provider before I take this medicine? They need to know if you have any of these conditions: -blood disorders -history of other chemotherapy -infection -kidney disease -liver disease -recent or ongoing radiation therapy -tumors in the bone marrow -an unusual or allergic reaction to cyclophosphamide, other chemotherapy, other medicines, foods, dyes, or preservatives -pregnant or trying to get pregnant -breast-feeding How should I use this medicine? This drug is usually given as an injection into a vein or muscle or by infusion into a vein. It is administered in a hospital or clinic by a specially trained health care professional. Talk to your pediatrician regarding the use of this medicine in children. Special care may be needed. Overdosage: If you think you have taken too much of this medicine contact a poison control center or emergency room at once. NOTE: This medicine is only for you. Do not share this medicine with others. What if I miss a dose? It is important not to miss your dose. Call your doctor or health care professional if you are unable to keep an appointment. What may interact with this medicine? This medicine may interact with the following medications: -amiodarone -amphotericin B -azathioprine -certain antiviral medicines for HIV or AIDS such as protease inhibitors (e.g., indinavir, ritonavir) and zidovudine -certain blood pressure medications such as benazepril, captopril, enalapril, fosinopril, lisinopril, moexipril, monopril, perindopril, quinapril, ramipril, trandolapril -certain cancer medications such as anthracyclines (e.g., daunorubicin, doxorubicin), busulfan, cytarabine, paclitaxel, pentostatin, tamoxifen, trastuzumab -certain diuretics such as chlorothiazide, chlorthalidone, hydrochlorothiazide, indapamide, metolazone -certain medicines that treat or prevent blood clots like warfarin -certain muscle relaxants such as  succinylcholine -cyclosporine -etanercept -indomethacin -medicines to increase blood counts like filgrastim, pegfilgrastim, sargramostim -medicines used as general anesthesia -metronidazole -natalizumab This list may not describe all possible interactions. Give your health care provider a list of all the medicines, herbs, non-prescription drugs, or dietary supplements you use. Also tell them if you smoke, drink alcohol, or use illegal drugs. Some items may interact with your medicine. What should I watch for while using this medicine? Visit your doctor for checks on your progress. This drug may make you feel generally unwell. This is not uncommon, as chemotherapy can affect healthy cells as well as cancer cells. Report any side effects. Continue your course of treatment even though you feel ill unless your doctor tells you to stop. Drink water or other fluids as directed. Urinate often, even at night. In some cases, you may be given additional medicines to help with side effects. Follow all directions for their use. Call your doctor or health care professional for advice if you get a fever, chills or sore throat, or other symptoms of a cold or flu. Do not treat yourself. This drug decreases your body's ability to fight infections. Try to avoid being around people who are sick. This medicine may increase your risk to bruise or bleed. Call your doctor or health care professional if you notice any unusual bleeding. Be careful brushing and flossing your teeth   or using a toothpick because you may get an infection or bleed more easily. If you have any dental work done, tell your dentist you are receiving this medicine. You may get drowsy or dizzy. Do not drive, use machinery, or do anything that needs mental alertness until you know how this medicine affects you. Do not become pregnant while taking this medicine or for 1 year after stopping it. Women should inform their doctor if they wish to become  pregnant or think they might be pregnant. Men should not father a child while taking this medicine and for 4 months after stopping it. There is a potential for serious side effects to an unborn child. Talk to your health care professional or pharmacist for more information. Do not breast-feed an infant while taking this medicine. This medicine may interfere with the ability to have a child. This medicine has caused ovarian failure in some women. This medicine has caused reduced sperm counts in some men. You should talk with your doctor or health care professional if you are concerned about your fertility. If you are going to have surgery, tell your doctor or health care professional that you have taken this medicine. What side effects may I notice from receiving this medicine? Side effects that you should report to your doctor or health care professional as soon as possible: -allergic reactions like skin rash, itching or hives, swelling of the face, lips, or tongue -low blood counts - this medicine may decrease the number of white blood cells, red blood cells and platelets. You may be at increased risk for infections and bleeding. -signs of infection - fever or chills, cough, sore throat, pain or difficulty passing urine -signs of decreased platelets or bleeding - bruising, pinpoint red spots on the skin, black, tarry stools, blood in the urine -signs of decreased red blood cells - unusually weak or tired, fainting spells, lightheadedness -breathing problems -dark urine -dizziness -palpitations -swelling of the ankles, feet, hands -trouble passing urine or change in the amount of urine -weight gain -yellowing of the eyes or skin Side effects that usually do not require medical attention (report to your doctor or health care professional if they continue or are bothersome): -changes in nail or skin color -hair loss -missed menstrual periods -mouth sores -nausea, vomiting This list may not  describe all possible side effects. Call your doctor for medical advice about side effects. You may report side effects to FDA at 1-800-FDA-1088. Where should I keep my medicine? This drug is given in a hospital or clinic and will not be stored at home. NOTE: This sheet is a summary. It may not cover all possible information. If you have questions about this medicine, talk to your doctor, pharmacist, or health care provider.  2019 Elsevier/Gold Standard (2012-07-26 16:22:58)    

## 2018-11-26 NOTE — Progress Notes (Signed)
I called John and let her know the results of her liver biopsy which are very favorable.

## 2018-11-26 NOTE — Telephone Encounter (Signed)
Gave avs and calendar ° °

## 2018-11-27 ENCOUNTER — Telehealth: Payer: Self-pay | Admitting: *Deleted

## 2018-11-27 NOTE — Telephone Encounter (Signed)
Called [name] for chemotherapy F/U.  Patient is doing well.  "Felt some type of way after treatment so I started nausea medicines.  Is it okay to take the dexamethasone and prochlorperazine at the same time?  No vomiting.  Stopped zyrtec because Claritin will be used up to ten days after treatment.  I also would like to know if I can continue, eye drops for allergies, Tylenol cold for nasal, sinus flare ups or cough, Regular Tylenol for back pain and breast throbbing?"    Denies further side effects or symptoms.  Uses Brittany Rodgers.  Bowel and bladder functioning well.  Eating and drinking well.  Instructed to drink 64 oz minimum daily or at least the day before, of and after treatment.   No further questions or needs at this time.  Encouraged to call [phone] at anytime.  Expressed after hours process for symptoms, changes or needs.  Confirms "access to thermometer.  Says she will pick up Tramadol to use with heating pad for pain.  Stop Tylenol, Tylenol Cold.  Zyrtec and hydrate well".

## 2018-11-28 ENCOUNTER — Inpatient Hospital Stay: Payer: BLUE CROSS/BLUE SHIELD

## 2018-11-28 ENCOUNTER — Encounter: Payer: Self-pay | Admitting: Genetic Counselor

## 2018-11-28 ENCOUNTER — Inpatient Hospital Stay (HOSPITAL_BASED_OUTPATIENT_CLINIC_OR_DEPARTMENT_OTHER): Payer: BLUE CROSS/BLUE SHIELD | Admitting: Genetic Counselor

## 2018-11-28 DIAGNOSIS — Z5111 Encounter for antineoplastic chemotherapy: Secondary | ICD-10-CM | POA: Diagnosis not present

## 2018-11-28 DIAGNOSIS — C50511 Malignant neoplasm of lower-outer quadrant of right female breast: Secondary | ICD-10-CM

## 2018-11-28 DIAGNOSIS — Z803 Family history of malignant neoplasm of breast: Secondary | ICD-10-CM | POA: Diagnosis not present

## 2018-11-28 DIAGNOSIS — Z171 Estrogen receptor negative status [ER-]: Secondary | ICD-10-CM | POA: Diagnosis not present

## 2018-11-28 LAB — COMPREHENSIVE METABOLIC PANEL
ALT: 29 U/L (ref 0–44)
AST: 13 U/L — AB (ref 15–41)
Albumin: 3.6 g/dL (ref 3.5–5.0)
Alkaline Phosphatase: 83 U/L (ref 38–126)
Anion gap: 8 (ref 5–15)
BUN: 16 mg/dL (ref 6–20)
CO2: 27 mmol/L (ref 22–32)
Calcium: 9 mg/dL (ref 8.9–10.3)
Chloride: 104 mmol/L (ref 98–111)
Creatinine, Ser: 0.72 mg/dL (ref 0.44–1.00)
Glucose, Bld: 162 mg/dL — ABNORMAL HIGH (ref 70–99)
Potassium: 3.7 mmol/L (ref 3.5–5.1)
Sodium: 139 mmol/L (ref 135–145)
Total Bilirubin: 0.6 mg/dL (ref 0.3–1.2)
Total Protein: 7.1 g/dL (ref 6.5–8.1)

## 2018-11-28 LAB — CBC WITH DIFFERENTIAL/PLATELET
Abs Immature Granulocytes: 0.06 10*3/uL (ref 0.00–0.07)
Basophils Absolute: 0 10*3/uL (ref 0.0–0.1)
Basophils Relative: 0 %
Eosinophils Absolute: 0 10*3/uL (ref 0.0–0.5)
Eosinophils Relative: 0 %
HCT: 35 % — ABNORMAL LOW (ref 36.0–46.0)
Hemoglobin: 11.3 g/dL — ABNORMAL LOW (ref 12.0–15.0)
Immature Granulocytes: 1 %
Lymphocytes Relative: 5 %
Lymphs Abs: 0.5 10*3/uL — ABNORMAL LOW (ref 0.7–4.0)
MCH: 28.9 pg (ref 26.0–34.0)
MCHC: 32.3 g/dL (ref 30.0–36.0)
MCV: 89.5 fL (ref 80.0–100.0)
MONOS PCT: 1 %
Monocytes Absolute: 0.1 10*3/uL (ref 0.1–1.0)
NEUTROS PCT: 93 %
Neutro Abs: 10 10*3/uL — ABNORMAL HIGH (ref 1.7–7.7)
Platelets: 322 10*3/uL (ref 150–400)
RBC: 3.91 MIL/uL (ref 3.87–5.11)
RDW: 12.8 % (ref 11.5–15.5)
WBC: 10.7 10*3/uL — ABNORMAL HIGH (ref 4.0–10.5)
nRBC: 0 % (ref 0.0–0.2)

## 2018-11-28 NOTE — Progress Notes (Signed)
Pt. Did not receive injection today because it was to early. Pt to receive injection on  next visit on 12/03/18

## 2018-11-28 NOTE — Progress Notes (Signed)
REFERRING PROVIDER: Chauncey Cruel, MD [address] [place], [place] [address]  PRIMARY PROVIDER:  [name], MD  PRIMARY REASON FOR VISIT:  1. Malignant neoplasm of lower-outer quadrant of right breast of female, estrogen receptor negative ([place])   2. Family history of breast cancer      HISTORY OF PRESENT ILLNESS:   Ms. Brittany Rodgers, a 48 y.o. female, was seen for a [facility] cancer genetics consultation at the request of [name] due to a personal and family history of cancer.  Ms. Brittany Rodgers presents to clinic today to discuss the possibility of a hereditary predisposition to cancer, genetic testing, and to further clarify her future cancer risks, as well as potential cancer risks for family members.   In February 2020, at the age of 56, Brittany Rodgers was diagnosed with Triple negative invasive ductal carcinoma of the right breast. This will be treated with chemotherapy, radiation and surgery.     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 breast of female, estrogen receptor negative ([place])    11/26/2018 -  Chemotherapy    The patient had DOXOrubicin (ADRIAMYCIN) chemo injection 106 mg, 60 mg/m2 = 106 mg, Intravenous,  Once, 1 of 4 cycles Administration: 106 mg (11/26/2018) palonosetron (ALOXI) injection 0.25 mg, 0.25 mg, Intravenous,  Once, 1 of 8 cycles Administration: 0.25 mg (11/26/2018) pegfilgrastim-cbqv (UDENYCA) injection 6 mg, 6 mg, Subcutaneous, Once, 1 of 4 cycles CARBOplatin (PARAPLATIN) in sodium chloride 0.9 % 100 mL chemo infusion, , Intravenous,  Once, 0 of 4 cycles cyclophosphamide (CYTOXAN) 1,060 mg in sodium chloride 0.9 % 250 mL chemo infusion, 600 mg/m2 = 1,060 mg, Intravenous,  Once, 1 of 4 cycles Administration: 1,060 mg (11/26/2018) PACLitaxel (TAXOL) 144 mg in sodium chloride 0.9 % 250 mL chemo  infusion (</= 59m/m2), 80 mg/m2, Intravenous,  Once, 0 of 4 cycles fosaprepitant (EMEND) 150 mg, dexamethasone (DECADRON) 12 mg in sodium chloride 0.9 % 145 mL IVPB, , Intravenous,  Once, 1 of 8 cycles Administration:  (11/26/2018)  for chemotherapy treatment.       HORMONAL RISK FACTORS:  Menarche was at age 48  First live birth at age 89  OCP use for approximately a few years years.  Ovaries intact: yes.  Hysterectomy: no.  Menopausal status: premenopausal.  HRT use: 0 years. Colonoscopy: yes; at age [age]for IBS. Mammogram within the last year: yes. Number of breast biopsies: 1. Up to date with pelvic exams:  yes. Any excessive radiation exposure in the past:  no  Past Medical History:  Diagnosis Date  . Chronic cough   . Family history of breast cancer   . GERD (gastroesophageal reflux disease)   . Hyperlipidemia   . IBS (irritable bowel syndrome)    sees Dr. CSilvano Rusk  . Routine gynecological examination    sees Dr. MFreda Munro   Past Surgical History:  Procedure Laterality Date  . CESAREAN SECTION    . PORTACATH PLACEMENT Right 11/14/2018   Procedure: INSERTION PORT-A-CATH WITH ULTRASOUND;  Surgeon: WRolm Bookbinder MD;  Location: [facility]  Service: General;  Laterality: Right;  . ROOT CANAL      Social History   Socioeconomic History  . Marital status: Married    Spouse name: Not on file  . Number of children: Not on file  . Years of  education: Not on file  . Highest education level: Not on file  Occupational History  . Not on file  Social Needs  . Financial resource strain: Not on file  . Food insecurity:    Worry: Not on file    Inability: Not on file  . Transportation needs:    Medical: Not on file    Non-medical: Not on file  Tobacco Use  . Smoking status: Never Smoker  . Smokeless tobacco: Never Used  Substance and Sexual Activity  . Alcohol use: No    Alcohol/week: 0.0 standard drinks  . Drug use: No  . Sexual  activity: Not on file  Lifestyle  . Physical activity:    Days per week: Not on file    Minutes per session: Not on file  . Stress: Not on file  Relationships  . Social connections:    Talks on phone: Not on file    Gets together: Not on file    Attends religious service: Not on file    Active member of club or organization: Not on file    Attends meetings of clubs or organizations: Not on file    Relationship status: Not on file  Other Topics Concern  . Not on file  Social History Narrative  . Not on file     FAMILY HISTORY:  We obtained a detailed, 4-generation family history.  Significant diagnoses are listed below: Family History  Problem Relation Age of Onset  . Hypertension Other   . Breast cancer Other        mat first cousin's daughter  . Breast cancer Maternal Aunt   . Hypertension Mother   . Breast cancer Maternal Aunt   . Breast cancer Maternal Aunt     The patient has a son and daughter who are cancer free.  She has four sisters who are cancer free.  Both parents are deceased.  The patient's mother died at 24 from complications of high blood pressure.  She had many siblings, three sisters had breast cancer, and one sister has a granddaughter with breast cancer. The maternal grandparents are deceased.  The patient's father is deceased at 57 from 'stomach problems'.  He had multiple siblings, none reported to have cancer.  The paternal grandparents are deceased.  Ms. Brittany Rodgers is unaware of previous family history of genetic testing for hereditary cancer risks. Patient's maternal ancestors are of African American descent, and paternal ancestors are of African American descent. There is no reported Ashkenazi Jewish ancestry. There is no known consanguinity.  GENETIC COUNSELING ASSESSMENT: Brittany Rodgers is a 48 y.o. female with a personal and family history of breast cancer which is somewhat suggestive of a hereditary cancer syndrome and predisposition to cancer. We,  therefore, discussed and recommended the following at today's visit.   DISCUSSION: We discussed that about 5-10% of breast cancer is hereditary with most cases due to BRCA mutations. There are other genes that can be associated with hereditary breast cancer genes, PALB2 is one that is associated with triple negative breast cancer.  We reviewed the characteristics, features and inheritance patterns of hereditary cancer syndromes. We also discussed genetic testing, including the appropriate family members to test, the process of testing, insurance coverage and turn-around-time for results. We discussed the implications of a negative, positive and/or variant of uncertain significant result. We recommended Brittany Rodgers pursue genetic testing for the common hereditary cancer gene panel. The Hereditary Gene Panel offered by [facility] includes sequencing and/or deletion duplication testing of  the following 47 genes: APC, ATM, AXIN2, BARD1, BMPR1A, BRCA1, BRCA2, BRIP1, CDH1, CDK4, CDKN2A (p14ARF), CDKN2A (p16INK4a), CHEK2, CTNNA1, DICER1, EPCAM (Deletion/duplication testing only), GREM1 (promoter region deletion/duplication testing only), KIT, MEN1, MLH1, MSH2, MSH3, MSH6, MUTYH, NBN, NF1, NHTL1, PALB2, PDGFRA, PMS2, POLD1, POLE, PTEN, RAD50, RAD51C, RAD51D, SDHB, SDHC, SDHD, SMAD4, SMARCA4. STK11, TP53, TSC1, TSC2, and VHL.  The following genes were evaluated for sequence changes only: SDHA and HOXB13 c.251G>A variant only.   Based on Brittany Rodgers's personal and family history of cancer, she meets medical criteria for genetic testing. Despite that she meets criteria, she may still have an out of pocket cost. We discussed that if her out of pocket cost for testing is over $100, the laboratory will call and confirm whether she wants to proceed with testing.  If the out of pocket cost of testing is less than $100 she will be billed by the genetic testing laboratory.   PLAN: Despite our recommendation, Brittany Rodgers did not wish to  pursue genetic testing at today's visit. We understand this decision, and remain available to coordinate genetic testing at any time in the future. We discussed that her surgeon or oncologist may bring this up in the future if they feel that genetic testing will help with their treatment of her cancer.  In the meantime, we recommend Brittany Rodgers continue to follow the cancer screening guidelines given by her primary healthcare provider.  Lastly, we encouraged Brittany Rodgers to remain in contact with cancer genetics annually so that we can continuously update the family history and inform her of any changes in cancer genetics and testing that may be of benefit for this family.   Ms.  Brittany Rodgers questions were answered to her satisfaction today. Our contact information was provided should additional questions or concerns arise. Thank you for the referral and allowing [name] to share in the care of your patient.   Karen P. Florene Glen, [place], [facility] Certified Genetic Counselor [email or web address] phone: [phone]  The patient was seen for a total of 35 minutes in face-to-face genetic counseling.  This patient was discussed with Drs. Magrinat, Lindi Adie and/or Burr Medico who agrees with the above.    _______________________________________________________________________ For Office Staff:  Number of people involved in session: 1 Was an Intern/ student involved with case: no

## 2018-12-03 ENCOUNTER — Other Ambulatory Visit: Payer: BLUE CROSS/BLUE SHIELD

## 2018-12-03 ENCOUNTER — Ambulatory Visit: Payer: BLUE CROSS/BLUE SHIELD

## 2018-12-03 ENCOUNTER — Inpatient Hospital Stay (HOSPITAL_BASED_OUTPATIENT_CLINIC_OR_DEPARTMENT_OTHER): Payer: BLUE CROSS/BLUE SHIELD | Admitting: Adult Health

## 2018-12-03 ENCOUNTER — Telehealth: Payer: Self-pay | Admitting: Adult Health

## 2018-12-03 ENCOUNTER — Encounter: Payer: Self-pay | Admitting: Adult Health

## 2018-12-03 ENCOUNTER — Inpatient Hospital Stay: Payer: BLUE CROSS/BLUE SHIELD

## 2018-12-03 VITALS — BP 132/76 | HR 88 | Temp 97.6°F | Resp 18 | Ht 62.0 in | Wt 164.0 lb

## 2018-12-03 DIAGNOSIS — C50511 Malignant neoplasm of lower-outer quadrant of right female breast: Secondary | ICD-10-CM

## 2018-12-03 DIAGNOSIS — Z803 Family history of malignant neoplasm of breast: Secondary | ICD-10-CM

## 2018-12-03 DIAGNOSIS — R05 Cough: Secondary | ICD-10-CM

## 2018-12-03 DIAGNOSIS — K219 Gastro-esophageal reflux disease without esophagitis: Secondary | ICD-10-CM

## 2018-12-03 DIAGNOSIS — Z79899 Other long term (current) drug therapy: Secondary | ICD-10-CM

## 2018-12-03 DIAGNOSIS — Z7689 Persons encountering health services in other specified circumstances: Secondary | ICD-10-CM

## 2018-12-03 DIAGNOSIS — Z171 Estrogen receptor negative status [ER-]: Secondary | ICD-10-CM

## 2018-12-03 DIAGNOSIS — E785 Hyperlipidemia, unspecified: Secondary | ICD-10-CM

## 2018-12-03 DIAGNOSIS — R590 Localized enlarged lymph nodes: Secondary | ICD-10-CM

## 2018-12-03 DIAGNOSIS — G479 Sleep disorder, unspecified: Secondary | ICD-10-CM

## 2018-12-03 DIAGNOSIS — Z5111 Encounter for antineoplastic chemotherapy: Secondary | ICD-10-CM | POA: Diagnosis not present

## 2018-12-03 DIAGNOSIS — R160 Hepatomegaly, not elsewhere classified: Secondary | ICD-10-CM

## 2018-12-03 DIAGNOSIS — K589 Irritable bowel syndrome without diarrhea: Secondary | ICD-10-CM

## 2018-12-03 DIAGNOSIS — N200 Calculus of kidney: Secondary | ICD-10-CM

## 2018-12-03 LAB — CBC WITH DIFFERENTIAL/PLATELET
Abs Immature Granulocytes: 0.02 10*3/uL (ref 0.00–0.07)
Basophils Absolute: 0 10*3/uL (ref 0.0–0.1)
Basophils Relative: 0 %
Eosinophils Absolute: 0.2 10*3/uL (ref 0.0–0.5)
Eosinophils Relative: 5 %
HCT: 38 % (ref 36.0–46.0)
Hemoglobin: 12.1 g/dL (ref 12.0–15.0)
Immature Granulocytes: 1 %
Lymphocytes Relative: 35 %
Lymphs Abs: 1.1 10*3/uL (ref 0.7–4.0)
MCH: 28.7 pg (ref 26.0–34.0)
MCHC: 31.8 g/dL (ref 30.0–36.0)
MCV: 90 fL (ref 80.0–100.0)
Monocytes Absolute: 0 10*3/uL — ABNORMAL LOW (ref 0.1–1.0)
Monocytes Relative: 1 %
Neutro Abs: 1.9 10*3/uL (ref 1.7–7.7)
Neutrophils Relative %: 58 %
Platelets: 243 10*3/uL (ref 150–400)
RBC: 4.22 MIL/uL (ref 3.87–5.11)
RDW: 12.2 % (ref 11.5–15.5)
WBC: 3.2 10*3/uL — ABNORMAL LOW (ref 4.0–10.5)
nRBC: 0 % (ref 0.0–0.2)

## 2018-12-03 LAB — COMPREHENSIVE METABOLIC PANEL
ALT: 30 U/L (ref 0–44)
AST: 9 U/L — ABNORMAL LOW (ref 15–41)
Albumin: 3.5 g/dL (ref 3.5–5.0)
Alkaline Phosphatase: 93 U/L (ref 38–126)
Anion gap: 11 (ref 5–15)
BUN: 11 mg/dL (ref 6–20)
CO2: 29 mmol/L (ref 22–32)
Calcium: 9 mg/dL (ref 8.9–10.3)
Chloride: 101 mmol/L (ref 98–111)
Creatinine, Ser: 0.79 mg/dL (ref 0.44–1.00)
GFR calc Af Amer: >60 mL/min (ref >60)
GFR calc non Af Amer: >60 mL/min (ref >60)
Glucose, Bld: 101 mg/dL — ABNORMAL HIGH (ref 70–99)
Potassium: 4 mmol/L (ref 3.5–5.1)
Sodium: 141 mmol/L (ref 135–145)
Total Bilirubin: 0.6 mg/dL (ref 0.3–1.2)
Total Protein: 7 g/dL (ref 6.5–8.1)

## 2018-12-03 MED ORDER — TBO-FILGRASTIM 480 MCG/0.8ML ~~LOC~~ SOSY
480.0000 ug | PREFILLED_SYRINGE | Freq: Once | SUBCUTANEOUS | Status: AC
  Administered 2018-12-03: 480 ug via SUBCUTANEOUS

## 2018-12-03 MED ORDER — TBO-FILGRASTIM 480 MCG/0.8ML ~~LOC~~ SOSY
PREFILLED_SYRINGE | SUBCUTANEOUS | Status: AC
  Filled 2018-12-03: qty 0.8

## 2018-12-03 NOTE — Progress Notes (Signed)
[place]  [phone] [phone]) [phone]    ID: Brittany Rodgers DOB: 06/02/1971  MR#: [id number]  XHB#:[id number]  Patient Care Team: [name], MD as PCP - General Rolm Bookbinder, MD as Consulting Physician (General Surgery) Magrinat, [name], MD as Consulting Physician (Oncology) [name], MD as Attending Physician (Radiation Oncology) [name], MD as Consulting Physician (Gastroenterology) [name], MD as Consulting Physician (Obstetrics and Gynecology) OTHER MD: Dr. Siri Cole (urology)    CHIEF COMPLAINT: Triple negative breast cancer  CURRENT TREATMENT: Neoadjuvant chemotherapy, goserelin   HISTORY OF CURRENT ILLNESS: From the original intake note:  Brittany Rodgers presented with a palpable right breast lump and associated tenderness for approximately 1 week. She called her doctor immediately following  She underwent unilateral right diagnostic mammography with tomography and right breast ultrasonography at [facility] on 10/28/2018 showing: Breast Density Category C. Radiopaque BB was placed at the site of the patient's palpable lump in the central right breast. An irregular hyperdense mass with associated increased trabecular thickening is seen in the deep central right breast. On physical exam, a firm, fixed mass in the central right breast is palpated. Target ultrasound is performed, showing and irregular hypoechoic mass at the 8 o'clock position 3 cm from the nipple. It measures 3.6 x 2.9 x 2.3 cm. There is associated peripheral vascularity. Evaluation of the right axilla demonstrates a single prominent lymph node demonstrating 0.4 cm cortical thickening. The remaining lymph nodes ave normal appearance with a thin, symmetric cortex.   Accordingly on 10/29/2018 she proceeded to biopsy of the right breast area in question. The pathology from this procedure showed ([id number]): invasive ductal carcinoma, grade III.  Prognostic indicators significant for: estrogen receptor, 0% negative and progesterone receptor, 0% negative. Proliferation marker Ki67 at 80%. HER2 negative (1+) by immunohistochemistry.  Biopsy of the right axilla was performed on the same day. The pathology from this procedure showed ([id number]): lymph node, negative for carcinoma.  This was felt to be concordant  The patient's subsequent history is as detailed below.   INTERVAL HISTORY: Brittany Rodgers returns today for follow-up and treatment of her triple negative breast cancer. She is unaccompanied today.  She began neoadjuvant chemotherapy receiving Doxorubicin and Cyclophosphamide given on day 1 of a 14 day cycle, with udenyca support on day 3.  She is currently cycle 1 day 8.    Brittany Rodgers is feeling moderately well, she is confused, because she received her chemotherapy on Tuesday, and on Thursday she returned to get her Udenyca, and the nurse told her it wasn't due.  She says that she was told it would be rescheduled and was sent to scheduling.  The scheduler told her that she didn't have anything to schedule, but she would check and call her.  Brittany Rodgers says she never received a call and is confused about what happened and if she missed something.    REVIEW OF SYSTEMS: Brittany Rodgers has been having challenges with sleep disturbance.  She has been up for 24 hours.  She wants to know what to do to help her sleep.  Sometimes the Lorazepam doesn't help.  She has had some mild fatigue.  She was minimally nauseated.  She took the dexamethasone a couple days longer, but says that other than the sleeplessness she is feeling well.  Brittany Rodgers denies any fevers or chills.  She hasn't had unusual headaches, bowel/bladder changes.  She has had some achiness, but has been taking her potassium supplementation daily which helps offset  these.  She denies any cough, shortness of breath, chest pain, or palpitations.  A detailed ROS was otherwise non contributory.     PAST  MEDICAL HISTORY: Past Medical History:  Diagnosis Date  . Chronic cough   . Family history of breast cancer   . GERD (gastroesophageal reflux disease)   . Hyperlipidemia   . IBS (irritable bowel syndrome)    sees Dr. Silvano Rusk   . Routine gynecological examination    sees Dr. Freda Munro     PAST SURGICAL HISTORY: Past Surgical History:  Procedure Laterality Date  . CESAREAN SECTION    . PORTACATH PLACEMENT Right 11/14/2018   Procedure: INSERTION PORT-A-CATH WITH ULTRASOUND;  Surgeon: Rolm Bookbinder, MD;  Location: [facility];  Service: General;  Laterality: Right;  . ROOT CANAL       FAMILY HISTORY: Family History  Problem Relation Age of Onset  . Hypertension Other   . Breast cancer Other        mat first cousin's daughter  . Breast cancer Maternal Aunt   . Hypertension Mother   . Breast cancer Maternal Aunt   . Breast cancer Maternal Aunt    Brittany Rodgers's father died from an unknown cause at age 15. [name] died from unknown health complications at age 78. The patient has 4 sisters. Brittany Rodgers has 3 maternal aunts that have been diagnosed with breast cancer. Patient denies anyone in her family having ovarian, prostate, or pancreatic cancer.    GYNECOLOGIC HISTORY:  Patient's last menstrual period was 11/12/2018. 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 Her husband, Brittany Rodgers, is a Administrator. The patient has two children, Brittany Rodgers and Brittany Rodgers, from another relationship. Brittany Rodgers is 37, lives in [place]. Brittany Rodgers is 70, lives in [place], and is in [employer].  The patient's husband Brittany Rodgers has a child, Brittany Rodgers, from another relationship; she lives in [place] and is in school. The patient has 5 grandchildren. She attends the [employer].   ADVANCED DIRECTIVES: Brittany Rodgers's  husband, Brittany Rodgers, is automatically her healthcare power of attorney.     HEALTH MAINTENANCE: Social History   Tobacco Use  . Smoking status: Never Smoker  . Smokeless tobacco: Never Used  Substance Use Topics  . Alcohol use: No    Alcohol/week: 0.0 standard drinks  . Drug use: No    Colonoscopy: yes  PAP: 02/2018  Bone density: no   Allergies  Allergen Reactions  . Barbiturates Other (See Comments)    Keeps pt awake  . Sulfonamide Derivatives Itching    Current Outpatient Medications  Medication Sig Dispense Refill  . atorvastatin (LIPITOR) 20 MG tablet Take 1 tablet (20 mg total) by mouth daily. 90 tablet 3  . cyclobenzaprine (FLEXERIL) 10 MG tablet TAKE 1 TABLET BY MOUTH THREE TIMES DAILY AS NEEDED FOR MUSCLE SPASMS 90 tablet 0  . dexamethasone (DECADRON) 4 MG tablet Take 2 tablets by mouth once a day on the day after chemotherapy and then take 2 tablets two times a day for 2 days. Take with food. 30 tablet 1  . lidocaine-prilocaine (EMLA) cream Apply to affected area once 30 g 3  . loratadine (CLARITIN) 10 MG tablet Take 1 tablet (10 mg total) by mouth daily. 60 tablet 0  . LORazepam (ATIVAN) 0.5 MG tablet Take 1 tablet (0.5 mg total) by  mouth at bedtime as needed (Nausea or vomiting). 30 tablet 0  . Omeprazole-Sodium Bicarbonate (ZEGERID) 20-1100 MG CAPS capsule Take 1 capsule by mouth daily before breakfast. 90 each 3  . polyethylene glycol (MIRALAX / GLYCOLAX) packet Take 17 g by mouth as needed. (Patient taking differently: Take 17 g by mouth daily as needed (constipation). Mix in 8 oz liquid and drink) 100 each 5  . potassium chloride (K-DUR) 10 MEQ tablet TAKE 1 TABLET BY MOUTH ONCE DAILY 90 tablet 3  . prochlorperazine (COMPAZINE) 10 MG tablet Take 1 tablet (10 mg total) by mouth every 6 (six) hours as needed (Nausea or vomiting). 30 tablet 1  . traMADol (ULTRAM) 50 MG tablet Take 1-2 tablets (50-100 mg total) by mouth every 6 (six) hours as needed for moderate pain. 20  tablet 0   No current facility-administered medications for this visit.      OBJECTIVE: Young African-American woman in no acute distress  Vitals:   12/03/18 0818  BP: 132/76  Pulse: 88  Resp: 18  Temp: 97.6 F (36.4 C)  SpO2: 100%     Body mass index is 30 kg/m.   Wt Readings from Last 3 Encounters:  12/03/18 164 lb (74.4 kg)  11/26/18 160 lb 1.6 oz (72.6 kg)  11/15/18 163 lb 4.8 oz (74.1 kg)      ECOG FS:1 - Symptomatic but completely ambulatory GENERAL: Patient is a well appearing female in no acute distress HEENT:  Sclerae anicteric.  Oropharynx clear and moist. No ulcerations or evidence of oropharyngeal candidiasis. Neck is supple.  NODES:  No cervical, supraclavicular, or axillary lymphadenopathy palpated.  BREAST EXAM:  left breast mass is large and about 9-10 cm, taking up 3/4 of her breast LUNGS:  Clear to auscultation bilaterally.  No wheezes or rhonchi. HEART:  Regular rate and rhythm. No murmur appreciated. ABDOMEN:  Soft, nontender.  Positive, normoactive bowel sounds. No organomegaly palpated. MSK:  No focal spinal tenderness to palpation. Full range of motion bilaterally in the upper extremities. EXTREMITIES:  No peripheral edema.   SKIN:  Clear with no obvious rashes or skin changes. No nail dyscrasia. NEURO:  Nonfocal. Well oriented.  Appropriate affect.      LAB RESULTS:  CMP     Component Value Date/Time   NA 139 11/28/2018 1003   K 3.7 11/28/2018 1003   CL 104 11/28/2018 1003   CO2 27 11/28/2018 1003   GLUCOSE 162 (H) 11/28/2018 1003   BUN 16 11/28/2018 1003   CREATININE 0.72 11/28/2018 1003   CREATININE 0.86 11/06/2018 0821   CALCIUM 9.0 11/28/2018 1003   PROT 7.1 11/28/2018 1003   ALBUMIN 3.6 11/28/2018 1003   AST 13 (L) 11/28/2018 1003   AST 11 (L) 11/06/2018 0821   ALT 29 11/28/2018 1003   ALT 25 11/06/2018 0821   ALKPHOS 83 11/28/2018 1003   BILITOT 0.6 11/28/2018 1003   BILITOT 0.5 11/06/2018 0821   GFRNONAA >60 11/28/2018  1003   GFRNONAA >60 11/06/2018 0821   GFRAA >60 11/28/2018 1003   GFRAA >60 11/06/2018 0821    No results found for: TOTALPROTELP, ALBUMINELP, A1GS, A2GS, BETS, BETA2SER, GAMS, MSPIKE, SPEI  No results found for: KPAFRELGTCHN, LAMBDASER, KAPLAMBRATIO  Lab Results  Component Value Date   WBC 3.2 (L) 12/03/2018   NEUTROABS PENDING 12/03/2018   HGB 12.1 12/03/2018   HCT 38.0 12/03/2018   MCV 90.0 12/03/2018   PLT 243 12/03/2018    @LASTCHEMISTRY @  No results found for: LABCA2  No components found for: [id number]  No results for input(s): INR in the last 168 hours.  No results found for: LABCA2  No results found for: JTT017  No results found for: BLT903  No results found for: ESP233  No results found for: CA2729  No components found for: HGQUANT  No results found for: CEA1 / No results found for: CEA1   No results found for: AFPTUMOR  No results found for: CHROMOGRNA  No results found for: PSA1  Appointment on 12/03/2018  Component Date Value Ref Range Status  . WBC 12/03/2018 3.2* 4.0 - 10.5 K/uL Final  . RBC 12/03/2018 4.22  3.87 - 5.11 MIL/uL Final  . Hemoglobin 12/03/2018 12.1  12.0 - 15.0 g/dL Final  . HCT 12/03/2018 38.0  36.0 - 46.0 % Final  . MCV 12/03/2018 90.0  80.0 - 100.0 fL Final  . MCH 12/03/2018 28.7  26.0 - 34.0 pg Final  . MCHC 12/03/2018 31.8  30.0 - 36.0 g/dL Final  . RDW 12/03/2018 12.2  11.5 - 15.5 % Final  . Platelets 12/03/2018 243  150 - 400 K/uL Final  . nRBC 12/03/2018 0.0  0.0 - 0.2 % Final   Performed at [facility], [place] [address], [place], [place] [address]  . Neutrophils Relative % 12/03/2018 PENDING  % Incomplete  . Neutro Abs 12/03/2018 PENDING  1.7 - 7.7 K/uL Incomplete  . Band Neutrophils 12/03/2018 PENDING  % Incomplete  . Lymphocytes Relative 12/03/2018 PENDING  % Incomplete  . Lymphs Abs 12/03/2018 PENDING  0.7 - 4.0 K/uL Incomplete  . Monocytes Relative 12/03/2018 PENDING  % Incomplete  .  Monocytes Absolute 12/03/2018 PENDING  0.1 - 1.0 K/uL Incomplete  . Eosinophils Relative 12/03/2018 PENDING  % Incomplete  . Eosinophils Absolute 12/03/2018 PENDING  0.0 - 0.5 K/uL Incomplete  . Basophils Relative 12/03/2018 PENDING  % Incomplete  . Basophils Absolute 12/03/2018 PENDING  0.0 - 0.1 K/uL Incomplete  . WBC Morphology 12/03/2018 PENDING   Incomplete  . RBC Morphology 12/03/2018 PENDING   Incomplete  . Smear Review 12/03/2018 PENDING   Incomplete  . Other 12/03/2018 PENDING  % Incomplete  . nRBC 12/03/2018 PENDING  0 /100 WBC Incomplete  . Metamyelocytes Relative 12/03/2018 PENDING  % Incomplete  . Myelocytes 12/03/2018 PENDING  % Incomplete  . Promyelocytes Relative 12/03/2018 PENDING  % Incomplete  . Blasts 12/03/2018 PENDING  % Incomplete    (this displays the last labs from the last 3 days)  No results found for: TOTALPROTELP, ALBUMINELP, A1GS, A2GS, BETS, BETA2SER, GAMS, MSPIKE, SPEI (this displays SPEP labs)  No results found for: KPAFRELGTCHN, LAMBDASER, KAPLAMBRATIO (kappa/lambda light chains)  No results found for: HGBA, HGBA2QUANT, HGBFQUANT, HGBSQUAN (Hemoglobinopathy evaluation)   No results found for: LDH  No results found for: IRON, TIBC, IRONPCTSAT (Iron and TIBC)  No results found for: FERRITIN  Urinalysis    Component Value Date/Time   COLORURINE YELLOW 05/18/2010 1042   APPEARANCEUR CLOUDY (A) 05/18/2010 1042   LABSPEC 1.028 05/18/2010 1042   PHURINE 5.5 05/18/2010 1042   GLUCOSEU NEGATIVE 05/18/2010 1042   HGBUR NEGATIVE 05/18/2010 1042   BILIRUBINUR negative 05/31/2018 1011   KETONESUR NEGATIVE 05/18/2010 1042   PROTEINUR Positive (A) 05/31/2018 1011   PROTEINUR NEGATIVE 05/18/2010 1042   UROBILINOGEN 0.2 05/31/2018 1011   UROBILINOGEN 0.2 05/18/2010 1042   NITRITE NEGATIVE 05/31/2018 1011   NITRITE NEGATIVE 05/18/2010 1042   LEUKOCYTESUR Negative 05/31/2018 1011     STUDIES:  Ct Chest W  Contrast  Result Date:  11/15/2018 CLINICAL DATA:  New diagnosis of right breast cancer, presenting for chest staging prior to chemotherapy. EXAM: CT CHEST WITH CONTRAST TECHNIQUE: Multidetector CT imaging of the chest was performed during intravenous contrast administration. CONTRAST:  70m OMNIPAQUE IOHEXOL 300 MG/ML  SOLN COMPARISON:  11/14/2018 chest radiograph. FINDINGS: Cardiovascular: Normal heart size. No significant pericardial effusion/thickening. Right internal jugular Port-A-Cath terminates at the cavoatrial junction. Expected subcutaneous emphysema surrounding Port-A-Cath given recent placement. Great vessels are normal in course and caliber. No central pulmonary emboli. Mediastinum/Nodes: No discrete thyroid nodules. Unremarkable esophagus. Enlarged 1.1 cm right axillary node (series 2/image 54). No left axillary adenopathy. No mediastinal or hilar adenopathy. Lungs/Pleura: No pneumothorax. No pleural effusion. No acute consolidative airspace disease, lung masses or significant pulmonary nodules. Upper abdomen: There is a poorly marginated 4.9 x 3.1 cm heterogeneous hypodense mass at the posterior right liver margin (series 2/image 106), favor an exophytic liver mass, which abuts upper right kidney and right adrenal gland. There is partial visualization of a poorly marginated hypodense 1.8 x 1.7 cm segment 4B left liver lobe mass (series 2/image 128). Nonobstructing 3 mm upper right renal stone. Musculoskeletal: No aggressive appearing focal osseous lesions. Heterogeneously enhancing posterior right breast 5.6 x 4.5 cm mass (series 2/image 80). IMPRESSION: 1. Posterior right breast 5.6 cm mass compatible with primary right breast malignancy. 2. Right axillary lymphadenopathy compatible with nodal metastatic disease. 3. Two poorly marginated hypodense liver masses, indeterminate, suspicious for liver metastases. Further staging evaluation with CT abdomen/pelvis with oral and IV contrast suggested. PET-CT or MRI abdomen without  and with IV contrast may also be considered, as clinically warranted. 4. No pulmonary nodules. Electronically Signed   By: JIlona SorrelM.D.   On: 11/15/2018 12:55   Nm Bone Scan Whole Body  Result Date: 11/15/2018 CLINICAL DATA:  Invasive breast cancer EXAM: NUCLEAR MEDICINE WHOLE BODY BONE SCAN TECHNIQUE: Whole body anterior and posterior images were obtained approximately 3 hours after intravenous injection of radiopharmaceutical. RADIOPHARMACEUTICALS:  22 mCi Technetium-967mDP IV COMPARISON:  None Radiographic correlation: CT chest 11/15/2018 FINDINGS: Minimal uptake of 12 tracer at the shoulders, elbows, sternoclavicular joints, typically degenerative. No additional sites of abnormal osseous tracer accumulation are identified. Slight rotation of the pelvis. Expected urinary tract and soft tissue distribution of tracer. IMPRESSION: No scintigraphic evidence of osseous metastatic disease. Electronically Signed   By: MaLavonia Dana.D.   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 A borderline abnormal right axillary lymph node was biopsied with no evidence of malignancy. LABS:  None obtained on site today. EXAM: BILATERAL BREAST MRI WITH AND WITHOUT CONTRAST TECHNIQUE: Multiplanar, multisequence MR images of both breasts were obtained prior to and following the intravenous administration of 7 ml of Gadavist Three-dimensional MR images were rendered by post-processing of the original MR data on an independent workstation. The three-dimensional MR images were interpreted, and findings are reported in the following complete MRI report for this study. Three dimensional images were evaluated at the independent DynaCad workstation COMPARISON:  Current and previous mammogram, ultrasound and biopsy examinations.  FINDINGS: Breast composition: b. Scattered fibroglandular tissue. Background parenchymal enhancement: Moderate. Right breast: 5.4 x 4.6 x 3.9 cm oval mass with a mildly to moderately thickened rind of peripheral enhancing soft tissue and larger, more  confluent irregular enhancing soft tissue component posteriorly in the lower outer quadrant of the right breast. This corresponds to the recently biopsied malignancy. This is abutting the underlying pectoralis muscle and extending into the anterior aspect of the muscle with abnormal enhancement. The mass has a mixture of enhancement kinetics, including a small amount of rapid wash-in/washout kinetics and larger amount of persistent and plateau kinetics. This masses within a large area of non mass enhancement involving the lower outer quadrant and lower inner quadrant of the right breast and extending from the far anterior aspect of the breast to the far posterior aspect of the breast. This also has some extension into the upper outer and upper inner quadrants and, with the biopsied mass, involve an area measuring 9.6 x 6.8 x 4.9 cm. This has predominantly persistent enhancement kinetics with a small amount of plateau kinetics. Left breast: No mass or abnormal enhancement. Lymph nodes: There is 1 abnormally enlarged right axillary lymph node with abnormal cortical thickening, suspicious for a metastatic node. This corresponds to the recently biopsied node with benign results and contains a biopsy marker artifact. There is a prominent internal mammary vein on the right with no enlarged internal mammary lymph nodes seen. Ancillary findings:  None. IMPRESSION: 1. 5.4 x 4.6 x 3.9 cm biopsy-proven malignancy in the posterior aspect of the lower outer quadrant of the right breast. This is invading the anterior aspect of the underlying pectoralis muscle and has a significant necrotic component. 2. Additional non mass enhancement involving all 4 quadrants of the right breast,  extending from the anterior to the posterior aspect of the breast. The non mass enhancement and biopsy-proven malignancy involve a combined area measuring 9.6 x 6.8 x 4.9 cm. The non mass enhancement is concerning four additional ductal carcinoma in situ. 3. Single abnormal appearing right axillary lymph node, corresponding to the recently biopsied lymph node with benign results. Despite the recent benign biopsy results, this is suspicious for a possible metastatic node. 4. No evidence of malignancy the on the left. RECOMMENDATION: 6: Treatment plan. BI-RADS CATEGORY  6: Known biopsy-proven malignancy. Electronically Signed   By: Claudie Revering M.D.   On: 11/12/2018 16:06   US Biopsy (liver)  Result Date: 11/25/2018 INDICATION: 48 year old female with a history of right lower outer quadrant breast cancer and evidence of nonspecific low-attenuation lesions within the liver on CT scan of the chest. She presents for ultrasound-guided biopsy to confirm tissue diagnosis of suspected breast cancer metastases to the liver. EXAM: Ultrasound-guided core biopsy, liver lesion MEDICATIONS: None. ANESTHESIA/SEDATION: Moderate (conscious) sedation was employed during this procedure. A total of Versed 4 mg and Fentanyl 100 mcg was administered intravenously. Moderate Sedation Time: 18 minutes. The patient's level of consciousness and vital signs were monitored continuously by radiology nursing throughout the procedure under my direct supervision. FLUOROSCOPY TIME:  None COMPLICATIONS: None immediate. PROCEDURE: Informed written consent was obtained from the patient after a thorough discussion of the procedural risks, benefits and alternatives. All questions were addressed. A timeout was performed prior to the initiation of the procedure. The liver was interrogated with ultrasound. To lesions are identified. The lesions are somewhat indistinct and hyperechoic in appearance. The lesion in hepatic segment 4 measures approximately  1.9 x 1.6 x 2.1 cm. The sonographic appearance is somewhat suggestive of a benign hemangioma. However, given the clinical history of breast cancer, the decision was made to proceed with biopsy. Ultrasound was used to interrogate the right upper quadrant. The liver was successfully identified.  A suitable skin entry site to approach the segment 4 lesion was selected and marked. The region was sterilely prepped and draped in the standard fashion using chlorhexidine skin prep. Local anesthesia was attained by infiltration with 1% lidocaine. A small dermatotomy was made. Under real-time sonographic guidance, a 17 gauge introducer needle was carefully advanced into the margin of the mass. Multiple 18 gauge core biopsies were then coaxially obtained using the bio Pince automated biopsy device. Biopsy specimens were placed in formalin and delivered to pathology for further analysis. As the introducer needle was removed, the biopsy tract was embolized with a Gel-Foam slurry. Post biopsy ultrasound imaging demonstrates no evidence of acute bleeding, perihepatic hematoma or other complication. The patient tolerated the procedure well. IMPRESSION: Technically successful ultrasound-guided core biopsy of hepatic lesion in segment 4. Of note, the imaging appearance is somewhat suggestive of a benign hemangioma. Signed, Criselda Peaches, MD, [facility] Radiology Electronically Signed   By: Jacqulynn Cadet M.D.   On: 11/25/2018 15:22   Dg Chest Port 1 View  Result Date: 11/14/2018 CLINICAL DATA:  Port-A-Cath placement. EXAM: PORTABLE CHEST 1 VIEW COMPARISON:  06/09/2008 FINDINGS: Right IJ approach central venous catheter terminates in the expected location of the cavoatrial junction. Cardiomediastinal silhouette is normal. Mediastinal contours appear intact. There is no evidence of focal airspace consolidation, pleural effusion or pneumothorax. Osseous structures are  without acute abnormality. Soft tissues are grossly normal. IMPRESSION: Right IJ approach central venous catheter terminates in the expected location of the cavoatrial junction. No evidence of pneumothorax. Electronically Signed   By: Fidela Salisbury M.D.   On: 11/14/2018 12:39   Dg Fluoro Guide Cv Line-no Report  Result Date: 11/14/2018 Fluoroscopy was utilized by the requesting physician.  No radiographic interpretation.    ELIGIBLE FOR AVAILABLE RESEARCH PROTOCOL: Declined   ASSESSMENT: 48 y.o. [place], [place] woman status post right breast lower outer quadrant biopsy 10/29/2018 for a clinical T2 N0, stage IIB invasive ductal carcinoma, grade 3, triple negative, with an MIB-1 of 80%  (a) CT of the chest 11/15/2018 shows possible liver metastases, biopsy 11/25/2018 consistent with hemangioma  (b) bone scan 11/15/2018 shows no suspicious bone lesions  (1) neoadjuvant chemotherapy will consist of doxorubicin and cyclophosphamide in dose dense fashion x4, starting 11/26/2018 to be followed by weekly paclitaxel and carboplatin x12  (2) patient desires ovarian function preservation: Zoladex started 11/08/2018  (3) definitive surgery to follow chemo  (4) adjuvant radiation to follow  (5) genetics testing 11/28/2018   PLAN: Brittany Rodgers is doing moderately well today.  I examined her breasts as above.  She notes the mass is softer, which is promising.  She tolerated the chemotherapy relatively well with minimal nausea and vomiting, which is good.  She unfortunately did not receive Udenyca.  I told her that I am not sure why she didn't receive it, but by my review, that it should have been given on Thursday 3/5 as scheduled because it was at least 24 hours after her chemotherapy infusion completion.  I contacted our assistant nurse manager Laurence Compton who is looking into the matter.  I apologized to [place], who was receptive, and wants to know what the plan is to keep her chemotherapy on  schedule.  Since her chemotherapy is due next week, we cannot give her Udenyca.  Instead, we will give her Granix.  [name] would like her to receive Granix 462mg daily x 3 days.  She will start today.  I  reviewed this with her in detail and encouraged her to take claritin daily.  She knows to return tomorrow and Thursday for these injections as well.  Brittany Rodgers and I also reviewed the issues of her sleep disturbance.  She notes more sleeping issues on days of inactivity, and I recommended she work on sleep hygiene, take small 5 minute walks at least 1-2 times per day, and on days she takes the dexamethasone, to take her evening dose earlier in the day at 2pm to help prevent the sleep issues.  We also reviewed the role of lavender lotions and chamomile tea to assist her sleep hygiene.  Brittany Rodgers will return tomorrow and Thursday for Granix, and in one week for labs, follow-up, and cycle 2 of her neoadjuvant chemotherapy.  She knows to call for any questions or concerns prior to her next appointment with [name].  A total of (30) minutes of face-to-face time was spent with this patient with greater than 50% of that time in counseling and care-coordination.   Wilber Bihari, NP  12/03/18 8:27 AM Medical Oncology and Hematology [facility] [address] [place], [place] [address] Tel. [phone]    Fax. [phone]

## 2018-12-03 NOTE — Telephone Encounter (Signed)
Gave avs and calendar ° °

## 2018-12-03 NOTE — Patient Instructions (Signed)
Tbo-Filgrastim injection What is this medicine? TBO-FILGRASTIM (T B O fil GRA stim) is a granulocyte colony-stimulating factor that stimulates the growth of neutrophils, a type of white blood cell important in the body's fight against infection. It is used to reduce the incidence of fever and infection in patients with certain types of cancer who are receiving chemotherapy that affects the bone marrow. This medicine may be used for other purposes; ask your health care provider or pharmacist if you have questions. COMMON BRAND NAME(S): Granix What should I tell my health care provider before I take this medicine? They need to know if you have any of these conditions: -bone scan or tests planned -kidney disease -sickle cell anemia -an unusual or allergic reaction to tbo-filgrastim, filgrastim, pegfilgrastim, other medicines, foods, dyes, or preservatives -pregnant or trying to get pregnant -breast-feeding How should I use this medicine? This medicine is for injection under the skin. If you get this medicine at home, you will be taught how to prepare and give this medicine. Refer to the Instructions for Use that come with your medication packaging. Use exactly as directed. Take your medicine at regular intervals. Do not take your medicine more often than directed. It is important that you put your used needles and syringes in a special sharps container. Do not put them in a trash can. If you do not have a sharps container, call your pharmacist or healthcare provider to get one. Talk to your pediatrician regarding the use of this medicine in children. While this drug may be prescribed for children as young as 1 month of age for selected conditions, precautions do apply. Overdosage: If you think you have taken too much of this medicine contact a poison control center or emergency room at once. NOTE: This medicine is only for you. Do not share this medicine with others. What if I miss a dose? It is  important not to miss your dose. Call your doctor or health care professional if you miss a dose. What may interact with this medicine? This medicine may interact with the following medications: -medicines that may cause a release of neutrophils, such as lithium This list may not describe all possible interactions. Give your health care provider a list of all the medicines, herbs, non-prescription drugs, or dietary supplements you use. Also tell them if you smoke, drink alcohol, or use illegal drugs. Some items may interact with your medicine. What should I watch for while using this medicine? You may need blood work done while you are taking this medicine. What side effects may I notice from receiving this medicine? Side effects that you should report to your doctor or health care professional as soon as possible: -allergic reactions like skin rash, itching or hives, swelling of the face, lips, or tongue -back pain -blood in the urine -dark urine -dizziness -fast heartbeat -feeling faint -shortness of breath or breathing problems -signs and symptoms of infection like fever or chills; cough; or sore throat -signs and symptoms of kidney injury like trouble passing urine or change in the amount of urine -stomach or side pain, or pain at the shoulder -sweating -swelling of the legs, ankles, or abdomen -tiredness Side effects that usually do not require medical attention (report to your doctor or health care professional if they continue or are bothersome): -bone pain -diarrhea -headache -muscle pain -vomiting This list may not describe all possible side effects. Call your doctor for medical advice about side effects. You may report side effects to FDA at   1-800-FDA-1088. Where should I keep my medicine? Keep out of the reach of children. Store in a refrigerator between 2 and 8 degrees C (36 and 46 degrees F). Keep in carton to protect from light. Throw away this medicine if it is left out  of the refrigerator for more than 5 consecutive days. Throw away any unused medicine after the expiration date. NOTE: This sheet is a summary. It may not cover all possible information. If you have questions about this medicine, talk to your doctor, pharmacist, or health care provider.  2019 Elsevier/Gold Standard (2017-05-01 16:56:18)  

## 2018-12-04 ENCOUNTER — Inpatient Hospital Stay: Payer: BLUE CROSS/BLUE SHIELD

## 2018-12-04 ENCOUNTER — Other Ambulatory Visit: Payer: Self-pay

## 2018-12-04 DIAGNOSIS — Z5111 Encounter for antineoplastic chemotherapy: Secondary | ICD-10-CM | POA: Diagnosis not present

## 2018-12-04 DIAGNOSIS — C50511 Malignant neoplasm of lower-outer quadrant of right female breast: Secondary | ICD-10-CM

## 2018-12-04 DIAGNOSIS — Z171 Estrogen receptor negative status [ER-]: Principal | ICD-10-CM

## 2018-12-04 MED ORDER — TBO-FILGRASTIM 480 MCG/0.8ML ~~LOC~~ SOSY
PREFILLED_SYRINGE | SUBCUTANEOUS | Status: AC
  Filled 2018-12-04: qty 0.8

## 2018-12-04 MED ORDER — TBO-FILGRASTIM 480 MCG/0.8ML ~~LOC~~ SOSY
480.0000 ug | PREFILLED_SYRINGE | Freq: Once | SUBCUTANEOUS | Status: AC
  Administered 2018-12-04: 480 ug via SUBCUTANEOUS

## 2018-12-05 ENCOUNTER — Inpatient Hospital Stay: Payer: BLUE CROSS/BLUE SHIELD

## 2018-12-05 ENCOUNTER — Other Ambulatory Visit: Payer: Self-pay

## 2018-12-05 VITALS — BP 114/70 | HR 98 | Temp 98.6°F | Resp 18

## 2018-12-05 DIAGNOSIS — C50511 Malignant neoplasm of lower-outer quadrant of right female breast: Secondary | ICD-10-CM

## 2018-12-05 DIAGNOSIS — Z5111 Encounter for antineoplastic chemotherapy: Secondary | ICD-10-CM | POA: Diagnosis not present

## 2018-12-05 DIAGNOSIS — Z171 Estrogen receptor negative status [ER-]: Principal | ICD-10-CM

## 2018-12-05 MED ORDER — GOSERELIN ACETATE 3.6 MG ~~LOC~~ IMPL
3.6000 mg | DRUG_IMPLANT | Freq: Once | SUBCUTANEOUS | Status: AC
  Administered 2018-12-05: 3.6 mg via SUBCUTANEOUS

## 2018-12-05 MED ORDER — TBO-FILGRASTIM 480 MCG/0.8ML ~~LOC~~ SOSY
480.0000 ug | PREFILLED_SYRINGE | Freq: Once | SUBCUTANEOUS | Status: AC
  Administered 2018-12-05: 480 ug via SUBCUTANEOUS

## 2018-12-05 MED ORDER — TBO-FILGRASTIM 480 MCG/0.8ML ~~LOC~~ SOSY
PREFILLED_SYRINGE | SUBCUTANEOUS | Status: AC
  Filled 2018-12-05: qty 0.8

## 2018-12-05 MED ORDER — GOSERELIN ACETATE 3.6 MG ~~LOC~~ IMPL
DRUG_IMPLANT | SUBCUTANEOUS | Status: AC
  Filled 2018-12-05: qty 3.6

## 2018-12-06 ENCOUNTER — Encounter (HOSPITAL_COMMUNITY): Payer: Self-pay | Admitting: *Deleted

## 2018-12-06 ENCOUNTER — Emergency Department (HOSPITAL_COMMUNITY)
Admission: EM | Admit: 2018-12-06 | Discharge: 2018-12-06 | Disposition: A | Discharge status: Home / Self Care | Payer: BLUE CROSS/BLUE SHIELD | Attending: Emergency Medicine | Admitting: Emergency Medicine

## 2018-12-06 ENCOUNTER — Emergency Department (HOSPITAL_COMMUNITY): Payer: BLUE CROSS/BLUE SHIELD

## 2018-12-06 ENCOUNTER — Other Ambulatory Visit: Payer: Self-pay

## 2018-12-06 DIAGNOSIS — C50511 Malignant neoplasm of lower-outer quadrant of right female breast: Secondary | ICD-10-CM | POA: Insufficient documentation

## 2018-12-06 DIAGNOSIS — Z79899 Other long term (current) drug therapy: Secondary | ICD-10-CM | POA: Insufficient documentation

## 2018-12-06 DIAGNOSIS — D61818 Other pancytopenia: Secondary | ICD-10-CM | POA: Insufficient documentation

## 2018-12-06 DIAGNOSIS — R509 Fever, unspecified: Secondary | ICD-10-CM | POA: Insufficient documentation

## 2018-12-06 LAB — CBC WITH DIFFERENTIAL/PLATELET
Abs Immature Granulocytes: 0.1 10*3/uL — ABNORMAL HIGH (ref 0.00–0.07)
Basophils Absolute: 0 10*3/uL (ref 0.0–0.1)
Basophils Relative: 0 %
Blasts: 2 %
Eosinophils Absolute: 0.1 10*3/uL (ref 0.0–0.5)
Eosinophils Relative: 2 %
HCT: 36.9 % (ref 36.0–46.0)
HEMOGLOBIN: 11.2 g/dL — AB (ref 12.0–15.0)
Lymphocytes Relative: 63 %
Lymphs Abs: 1.6 10*3/uL (ref 0.7–4.0)
MCH: 28.5 pg (ref 26.0–34.0)
MCHC: 30.4 g/dL (ref 30.0–36.0)
MCV: 93.9 fL (ref 80.0–100.0)
Metamyelocytes Relative: 1 %
Monocytes Absolute: 0.1 10*3/uL (ref 0.1–1.0)
Monocytes Relative: 5 %
Myelocytes: 3 %
NEUTROS PCT: 24 %
Neutro Abs: 0.6 10*3/uL — ABNORMAL LOW (ref 1.7–7.7)
Platelets: 158 10*3/uL (ref 150–400)
RBC: 3.93 MIL/uL (ref 3.87–5.11)
RDW: 12.5 % (ref 11.5–15.5)
WBC: 2.6 10*3/uL — ABNORMAL LOW (ref 4.0–10.5)
nRBC: 0 % (ref 0.0–0.2)

## 2018-12-06 LAB — COMPREHENSIVE METABOLIC PANEL
ALT: 26 U/L (ref 0–44)
AST: 11 U/L — ABNORMAL LOW (ref 15–41)
Albumin: 4.3 g/dL (ref 3.5–5.0)
Alkaline Phosphatase: 114 U/L (ref 38–126)
Anion gap: 8 (ref 5–15)
BUN: 10 mg/dL (ref 6–20)
CALCIUM: 8.8 mg/dL — AB (ref 8.9–10.3)
CO2: 26 mmol/L (ref 22–32)
Chloride: 105 mmol/L (ref 98–111)
Creatinine, Ser: 0.66 mg/dL (ref 0.44–1.00)
GFR calc Af Amer: >60 mL/min (ref >60)
GFR calc non Af Amer: >60 mL/min (ref >60)
Glucose, Bld: 103 mg/dL — ABNORMAL HIGH (ref 70–99)
Potassium: 3.4 mmol/L — ABNORMAL LOW (ref 3.5–5.1)
SODIUM: 139 mmol/L (ref 135–145)
Total Bilirubin: 0.6 mg/dL (ref 0.3–1.2)
Total Protein: 7.4 g/dL (ref 6.5–8.1)

## 2018-12-06 LAB — URINALYSIS, ROUTINE W REFLEX MICROSCOPIC
BILIRUBIN URINE: NEGATIVE
Glucose, UA: NEGATIVE mg/dL
Ketones, ur: NEGATIVE mg/dL
Leukocytes,Ua: NEGATIVE
Nitrite: NEGATIVE
Protein, ur: NEGATIVE mg/dL
SPECIFIC GRAVITY, URINE: 1.009 (ref 1.005–1.030)
pH: 7 (ref 5.0–8.0)

## 2018-12-06 LAB — PATHOLOGIST SMEAR REVIEW

## 2018-12-06 MED ORDER — ACETAMINOPHEN 500 MG PO TABS
1000.0000 mg | ORAL_TABLET | Freq: Once | ORAL | Status: AC
  Administered 2018-12-06: 1000 mg via ORAL
  Filled 2018-12-06: qty 2

## 2018-12-06 NOTE — ED Notes (Signed)
Patient states she does not want to be swabbed for the flu. She states her nasal passages are very dry and she has a lot of issues every time its done. She states its difficult and painful to get it into her nose and she would prefer not to. EDP has been made aware.

## 2018-12-06 NOTE — ED Triage Notes (Signed)
Pt stated "[facility] called [facility] after hours and told the RN that I was running a fever of 100.3 and then 20 minutes later it was 101.8.  She called [name] and he wanted me to come in.  I had my 1st chemo infusion approx 1 wk ago and my injections were Tues, Wed, & Thurs."

## 2018-12-06 NOTE — ED Provider Notes (Signed)
[facility] DEPT Provider Note   CSN: [id number] Arrival date & time: 12/06/18  3818    History   Chief Complaint Chief Complaint  Patient presents with  . Fever    HPI Brittany Rodgers is a 48 y.o. female.     HPI Patient recently started chemotherapy for breast cancer.  Had her first transfusion last week and has been getting injections.  Last injection was on Thursday.  She began having diffuse body aches yesterday especially in her low back.  Had a fever up to 101 this morning.  Called the chemo nurse and was advised to come to the emergency department.  She has had mild nasal congestion but denies sore throat, cough, shortness of breath.  She denies any abdominal pain.  She is had no nausea, vomiting or diarrhea.  No recent travel especially to [place] or [place].  No known sick contacts.  No new rashes. Past Medical History:  Diagnosis Date  . Chronic cough   . Family history of breast cancer   . GERD (gastroesophageal reflux disease)   . Hyperlipidemia   . IBS (irritable bowel syndrome)    sees Dr. Silvano Rusk   . Routine gynecological examination    sees Dr. Freda Munro    Patient Active Problem List   Diagnosis Date Noted  . Family history of breast cancer   . Malignant neoplasm of lower-outer quadrant of right breast of female, estrogen receptor negative ([place]) 11/04/2018  . Thoracic spine pain 05/03/2018  . Hypokalemia 02/13/2017  . Chronic right hip pain 11/26/2015  . Hyperlipidemia 12/23/2010  . ALLERGIC RHINITIS CAUSE UNSPECIFIED 07/24/2008  . GERD 10/14/2007  . IBS 10/14/2007    Past Surgical History:  Procedure Laterality Date  . CESAREAN SECTION    . PORTACATH PLACEMENT Right 11/14/2018   Procedure: INSERTION PORT-A-CATH WITH ULTRASOUND;  Surgeon: Rolm Bookbinder, MD;  Location: [facility];  Service: General;  Laterality: Right;  . ROOT CANAL       OB History   No obstetric history on file.       Home Medications    Prior to Admission medications   Medication Sig Start Date End Date Taking? Authorizing Provider  atorvastatin (LIPITOR) 20 MG tablet Take 1 tablet (20 mg total) by mouth daily. 05/31/18   [name], MD  cyclobenzaprine (FLEXERIL) 10 MG tablet TAKE 1 TABLET BY MOUTH THREE TIMES DAILY AS NEEDED FOR MUSCLE SPASMS 09/03/18   [name], MD  dexamethasone (DECADRON) 4 MG tablet Take 2 tablets by mouth once a day on the day after chemotherapy and then take 2 tablets two times a day for 2 days. Take with food. 11/15/18   Magrinat, [name], MD  lidocaine-prilocaine (EMLA) cream Apply to affected area once 11/15/18   Magrinat, [name], MD  loratadine (CLARITIN) 10 MG tablet Take 1 tablet (10 mg total) by mouth daily. 11/15/18   Magrinat, [name], MD  LORazepam (ATIVAN) 0.5 MG tablet Take 1 tablet (0.5 mg total) by mouth at bedtime as needed (Nausea or vomiting). 11/15/18   Magrinat, [name], MD  Omeprazole-Sodium Bicarbonate (ZEGERID) 20-1100 MG CAPS capsule Take 1 capsule by mouth daily before breakfast. 05/31/18   [name], MD  polyethylene glycol (MIRALAX / Floria Raveling) packet Take 17 g by mouth as needed. Patient taking differently: Take 17 g by mouth daily as needed (constipation). Mix in 8 oz liquid and drink 04/23/12   [name], MD  potassium  chloride (K-DUR) 10 MEQ tablet TAKE 1 TABLET BY MOUTH ONCE DAILY 11/30/17   [name], MD  prochlorperazine (COMPAZINE) 10 MG tablet Take 1 tablet (10 mg total) by mouth every 6 (six) hours as needed (Nausea or vomiting). 11/15/18   Magrinat, [name], MD  traMADol (ULTRAM) 50 MG tablet Take 1-2 tablets (50-100 mg total) by mouth every 6 (six) hours as needed for moderate pain. 11/26/18   Magrinat, [name], MD    Family History Family History  Problem Relation Age of Onset  . Hypertension Other   . Breast cancer Other        mat first cousin's daughter  . Breast cancer Maternal Aunt   . Hypertension Mother   .  Breast cancer Maternal Aunt   . Breast cancer Maternal Aunt     Social History Social History   Tobacco Use  . Smoking status: Never Smoker  . Smokeless tobacco: Never Used  Substance Use Topics  . Alcohol use: No    Alcohol/week: 0.0 standard drinks  . Drug use: No     Allergies   Barbiturates and Sulfonamide derivatives   Review of Systems Review of Systems  Constitutional: Positive for fatigue and fever.  HENT: Positive for congestion. Negative for sinus pressure, sore throat and trouble swallowing.   Eyes: Negative for pain and visual disturbance.  Respiratory: Negative for cough and shortness of breath.   Cardiovascular: Negative for chest pain and leg swelling.  Gastrointestinal: Negative for abdominal pain, diarrhea, nausea and vomiting.  Genitourinary: Negative for dysuria, flank pain and frequency.  Musculoskeletal: Positive for back pain and myalgias. Negative for neck pain and neck stiffness.  Skin: Negative for rash and wound.  Neurological: Negative for dizziness, weakness, light-headedness, numbness and headaches.  All other systems reviewed and are negative.    Physical Exam Updated Vital Signs BP (!) 147/84   Pulse (!) 103   Temp 98.8 F (37.1 C) (Oral)   Resp 15   Ht 5\' 2"  (1.575 m)   Wt 74.4 kg   LMP 11/12/2018   SpO2 99%   BMI 30.00 kg/m   Physical Exam Vitals signs and nursing note reviewed.  Constitutional:      General: She is not in acute distress.    Appearance: Normal appearance. She is well-developed. She is not ill-appearing.  HENT:     Head: Normocephalic and atraumatic.     Nose: Congestion present.     Mouth/Throat:     Mouth: Mucous membranes are moist.     Pharynx: No oropharyngeal exudate or posterior oropharyngeal erythema.  Eyes:     Extraocular Movements: Extraocular movements intact.     Pupils: Pupils are equal, round, and reactive to light.  Neck:     Musculoskeletal: Normal range of motion and neck supple. No  neck rigidity or muscular tenderness.  Cardiovascular:     Rate and Rhythm: Normal rate and regular rhythm.     Heart sounds: No murmur. No friction rub. No gallop.   Pulmonary:     Effort: Pulmonary effort is normal. No respiratory distress.     Breath sounds: Normal breath sounds. No stridor. No wheezing, rhonchi or rales.  Chest:     Chest wall: No tenderness.  Abdominal:     General: Bowel sounds are normal.     Palpations: Abdomen is soft.     Tenderness: There is no abdominal tenderness. There is no right CVA tenderness, left CVA tenderness, guarding or rebound.  Musculoskeletal:  Normal range of motion.        General: Tenderness present. No swelling, deformity or signs of injury.     Right lower leg: No edema.     Left lower leg: No edema.     Comments: Mild bilateral paraspinal lumbar tenderness to palpation.  No CVA tenderness.  No lower extremity swelling, asymmetry or tenderness.  Lymphadenopathy:     Cervical: No cervical adenopathy.  Skin:    General: Skin is warm and dry.     Capillary Refill: Capillary refill takes less than 2 seconds.     Findings: No erythema or rash.  Neurological:     General: No focal deficit present.     Mental Status: She is alert and oriented to person, place, and time.  Psychiatric:        Mood and Affect: Mood normal.        Behavior: Behavior normal.      ED Treatments / Results  Labs (all labs ordered are listed, but only abnormal results are displayed) Labs Reviewed  COMPREHENSIVE METABOLIC PANEL - Abnormal; Notable for the following components:      Result Value   Potassium 3.4 (*)    Glucose, Bld 103 (*)    Calcium 8.8 (*)    AST 11 (*)    All other components within normal limits  CBC WITH DIFFERENTIAL/PLATELET - Abnormal; Notable for the following components:   WBC 2.6 (*)    Hemoglobin 11.2 (*)    Neutro Abs 0.6 (*)    Abs Immature Granulocytes 0.10 (*)    All other components within normal limits  URINALYSIS,  ROUTINE W REFLEX MICROSCOPIC - Abnormal; Notable for the following components:   Color, Urine STRAW (*)    Hgb urine dipstick SMALL (*)    Bacteria, UA RARE (*)    All other components within normal limits  CULTURE, BLOOD (ROUTINE X 2)  CULTURE, BLOOD (ROUTINE X 2)  INFLUENZA PANEL BY PCR (TYPE A & B)  PATHOLOGIST SMEAR REVIEW    EKG None  Radiology Dg Chest 2 View  Result Date: 12/06/2018 CLINICAL DATA:  Fever that began last Night, pt states had first chemo infusion 1 week ago, breast ca EXAM: CHEST - 2 VIEW COMPARISON:  11/14/2018 FINDINGS: RIGHT-sided PowerPort tip overlies the superior vena cava. Heart size is normal. There are no focal consolidations or pleural effusions. No pulmonary edema. No suspicious lytic or blastic lesions are identified. IMPRESSION: No evidence for acute cardiopulmonary abnormality. Electronically Signed   By: Nolon Nations M.D.   On: 12/06/2018 08:51    Procedures Procedures (including critical care time)  Medications Ordered in ED Medications  acetaminophen (TYLENOL) tablet 1,000 mg (1,000 mg Oral Given 12/06/18 1339)     Initial Impression / Assessment and Plan / ED Course  I have reviewed the triage vital signs and the nursing notes.  Pertinent labs & imaging results that were available during my care of the patient were reviewed by me and considered in my medical decision making (see chart for details).        Patient is very well-appearing in no distress.  She is afebrile in the emergency department.  Mild pancytopenia.  White blood cell count is 2.6.  Discussed with oncology on-call.  Recommends discharge home and will follow up.  Patient given strict return precautions and is voiced understanding.  Final Clinical Impressions(s) / ED Diagnoses   Final diagnoses:  Fever, unspecified fever cause  Pancytopenia ([place])  ED Discharge Orders    None       Julianne Rice, MD 12/06/18 1450

## 2018-12-06 NOTE — Discharge Instructions (Addendum)
Follow-up closely with your oncologist.  You may take Tylenol as needed for fever.  Return immediately for any worsening symptoms or concerns.

## 2018-12-09 NOTE — Progress Notes (Signed)
[place]  [phone] [phone]    ID: Brittany Rodgers DOB: 1971-07-06  MR#: [id number]  SFK#:[id number]  Patient Care Team: [name], MD as PCP - General Rolm Bookbinder, MD as Consulting Physician (General Surgery) , [name], MD as Consulting Physician (Oncology) [name], MD as Attending Physician (Radiation Oncology) [name], MD as Consulting Physician (Gastroenterology) [name], MD as Consulting Physician (Obstetrics and Gynecology) OTHER MD: Dr. Siri Cole (urology)    CHIEF COMPLAINT: Triple negative breast cancer  CURRENT TREATMENT: Neoadjuvant chemotherapy, goserelin   HISTORY OF CURRENT ILLNESS: From the original intake note:  Brittany Rodgers presented with a palpable right breast lump and associated tenderness for approximately 1 week. She called her doctor immediately following  She underwent unilateral right diagnostic mammography with tomography and right breast ultrasonography at [facility] on 10/28/2018 showing: Breast Density Category C. Radiopaque BB was placed at the site of the patient's palpable lump in the central right breast. An irregular hyperdense mass with associated increased trabecular thickening is seen in the deep central right breast. On physical exam, a firm, fixed mass in the central right breast is palpated. Target ultrasound is performed, showing and irregular hypoechoic mass at the 8 o'clock position 3 cm from the nipple. It measures 3.6 x 2.9 x 2.3 cm. There is associated peripheral vascularity. Evaluation of the right axilla demonstrates a single prominent lymph node demonstrating 0.4 cm cortical thickening. The remaining lymph nodes ave normal appearance with a thin, symmetric cortex.   Accordingly on 10/29/2018 she proceeded to biopsy of the right breast area in question. The pathology from this procedure showed ([id number]): invasive ductal carcinoma, grade III.  Prognostic indicators significant for: estrogen receptor, 0% negative and progesterone receptor, 0% negative. Proliferation marker Ki67 at 80%. HER2 negative (1+) by immunohistochemistry.  Biopsy of the right axilla was performed on the same day. The pathology from this procedure showed ([id number]): lymph node, negative for carcinoma.  This was felt to be concordant  The patient's subsequent history is as detailed below.   INTERVAL HISTORY: Brittany Rodgers returns today for follow-up and treatment of her triple negative breast cancer.  She continues on neoadjuvant chemotherapy receiving Doxorubicin and Cyclophosphamide given on day 1 of a 14 day cycle, with udenyca support on day 3. Today is day 1 cycle 2. She had some nausea, but she wasn't sure if it was from the Chemotherapy or her acid reflux.   Since her last visit here, she underwent a chest xray on 12/06/2018 for a fever showing No evidence for acute cardiopulmonary abnormality.   REVIEW OF SYSTEMS: Brittany Rodgers woke up on the morning of 12/06/2018 around 4:00 am with cold chills and a headache. She took her temperature, with a temperature of 100.4 and she took some Advil to treat. She drank some water, then retook her temperature, which measured 101.5. She had myalgia, had a headache, and had back pain. She then went to the ED on 12/06/2018.  Blood and urine cultures were obtained which remained negative.  A chest x-ray was obtained which was clear and she has by her account no respiratory symptoms.  Brittany Rodgers says that there was an issue receiving her Neulasta shot due to issues with her insurance company and a "timing issue."    The patient denies visual changes, vomiting, or dizziness. There has been no phlegm production, or pleurisy. This been no change in bowel or bladder habits. The patient denies unexplained weight loss, bleeding, rash, or fever.  A detailed review of systems was otherwise noncontributory.    PAST MEDICAL HISTORY: Past  Medical History:  Diagnosis Date  . Chronic cough   . Family history of breast cancer   . GERD (gastroesophageal reflux disease)   . Hyperlipidemia   . IBS (irritable bowel syndrome)    sees Dr. Silvano Rusk   . Routine gynecological examination    sees Dr. Freda Munro     PAST SURGICAL HISTORY: Past Surgical History:  Procedure Laterality Date  . CESAREAN SECTION    . PORTACATH PLACEMENT Right 11/14/2018   Procedure: INSERTION PORT-A-CATH WITH ULTRASOUND;  Surgeon: Rolm Bookbinder, MD;  Location: [facility];  Service: General;  Laterality: Right;  . ROOT CANAL       FAMILY HISTORY: Family History  Problem Relation Age of Onset  . Hypertension Other   . Breast cancer Other        mat first cousin's daughter  . Breast cancer Maternal Aunt   . Hypertension Mother   . Breast cancer Maternal Aunt   . Breast cancer Maternal Aunt    Brittany Rodgers's father died from an unknown cause at age 92. [name] died from unknown health complications at age 104. The patient has 4 sisters. Brittany Rodgers has 3 maternal aunts that have been diagnosed with breast cancer. Patient denies anyone in her family having ovarian, prostate, or pancreatic cancer.    GYNECOLOGIC HISTORY:  Patient's last menstrual period was 11/12/2018. 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 Her husband, Maison Agrusa, is a Administrator. The patient has two children, [name] and [name], from another relationship. [name] is 68, lives in [place]. [name] is 63, lives in [place], and is in Mud[employer].  The patient's husband [name] has a child, [name], from another relationship; she lives in [place] and is in school. The patient has 5 grandchildren. She attends the [employer].   ADVANCED DIRECTIVES: Brittany Rodgers's husband, [name], is  automatically her healthcare power of attorney.     HEALTH MAINTENANCE: Social History   Tobacco Use  . Smoking status: Never Smoker  . Smokeless tobacco: Never Used  Substance Use Topics  . Alcohol use: No    Alcohol/week: 0.0 standard drinks  . Drug use: No    Colonoscopy: yes  PAP: 02/2018  Bone density: no   Allergies  Allergen Reactions  . Barbiturates Other (See Comments)    Keeps pt awake  . Sulfonamide Derivatives Itching    Current Outpatient Medications  Medication Sig Dispense Refill  . atorvastatin (LIPITOR) 20 MG tablet Take 1 tablet (20 mg total) by mouth daily. 90 tablet 3  . cyclobenzaprine (FLEXERIL) 10 MG tablet TAKE 1 TABLET BY MOUTH THREE TIMES DAILY AS NEEDED FOR MUSCLE SPASMS 90 tablet 0  . dexamethasone (DECADRON) 4 MG tablet Take 2 tablets by mouth once a day on the day after chemotherapy and then take 2 tablets two times a day for 2 days. Take with food. 30 tablet 1  . lidocaine-prilocaine (EMLA) cream Apply to affected area once 30 g 3  . loratadine (CLARITIN) 10 MG tablet Take 1 tablet (10 mg total) by mouth daily. 60 tablet 0  . LORazepam (ATIVAN) 0.5 MG tablet Take 1 tablet (0.5 mg total) by mouth at bedtime as needed (Nausea or vomiting). 30 tablet 0  . Omeprazole-Sodium  Bicarbonate (ZEGERID) 20-1100 MG CAPS capsule Take 1 capsule by mouth daily before breakfast. 90 each 3  . polyethylene glycol (MIRALAX / GLYCOLAX) packet Take 17 g by mouth as needed. (Patient taking differently: Take 17 g by mouth daily as needed (constipation). Mix in 8 oz liquid and drink) 100 each 5  . potassium chloride (K-DUR) 10 MEQ tablet TAKE 1 TABLET BY MOUTH ONCE DAILY 90 tablet 3  . prochlorperazine (COMPAZINE) 10 MG tablet Take 1 tablet (10 mg total) by mouth every 6 (six) hours as needed (Nausea or vomiting). 30 tablet 1  . traMADol (ULTRAM) 50 MG tablet Take 1-2 tablets (50-100 mg total) by mouth every 6 (six) hours as needed for moderate pain. 20 tablet 0   No  current facility-administered medications for this visit.      OBJECTIVE: Young African-American woman appears well  Vitals:   12/10/18 0810  BP: 126/84  Pulse: 86  Resp: 18  Temp: 98.9 F (37.2 C)  SpO2: 100%     Body mass index is 29.89 kg/m.   Wt Readings from Last 3 Encounters:  12/10/18 163 lb 6.4 oz (74.1 kg)  12/06/18 164 lb (74.4 kg)  12/03/18 164 lb (74.4 kg)      ECOG FS:1 - Symptomatic but completely ambulatory  Sclerae unicteric, EOMs intact Oropharynx clear and moist No cervical or supraclavicular adenopathy Lungs no rales or rhonchi Heart regular rate and rhythm Abd soft, nontender, positive bowel sounds MSK no focal spinal tenderness, no upper extremity lymphedema Neuro: nonfocal, well oriented, appropriate affect Breasts: Deferred      LAB RESULTS:  CMP     Component Value Date/Time   NA 139 12/06/2018 0836   K 3.4 (L) 12/06/2018 0836   CL 105 12/06/2018 0836   CO2 26 12/06/2018 0836   GLUCOSE 103 (H) 12/06/2018 0836   BUN 10 12/06/2018 0836   CREATININE 0.66 12/06/2018 0836   CREATININE 0.86 11/06/2018 0821   CALCIUM 8.8 (L) 12/06/2018 0836   PROT 7.4 12/06/2018 0836   ALBUMIN 4.3 12/06/2018 0836   AST 11 (L) 12/06/2018 0836   AST 11 (L) 11/06/2018 0821   ALT 26 12/06/2018 0836   ALT 25 11/06/2018 0821   ALKPHOS 114 12/06/2018 0836   BILITOT 0.6 12/06/2018 0836   BILITOT 0.5 11/06/2018 0821   GFRNONAA >60 12/06/2018 0836   GFRNONAA >60 11/06/2018 0821   GFRAA >60 12/06/2018 0836   GFRAA >60 11/06/2018 0821    No results found for: TOTALPROTELP, ALBUMINELP, A1GS, A2GS, BETS, BETA2SER, GAMS, MSPIKE, SPEI  No results found for: KPAFRELGTCHN, LAMBDASER, KAPLAMBRATIO  Lab Results  Component Value Date   WBC 2.6 (L) 12/06/2018   NEUTROABS 0.6 (L) 12/06/2018   HGB 11.2 (L) 12/06/2018   HCT 36.9 12/06/2018   MCV 93.9 12/06/2018   PLT 158 12/06/2018    _0 @  No results found for: LABCA2  No components found for:  BTDHRC163  No results for input(s): INR in the last 168 hours.  No results found for: LABCA2  No results found for: AGT364  No results found for: WOE321  No results found for: YYQ825  No results found for: CA2729  No components found for: HGQUANT  No results found for: CEA1 / No results found for: CEA1   No results found for: AFPTUMOR  No results found for: CHROMOGRNA  No results found for: PSA1  No visits with results within 3 Day(s) from this visit.  Latest known visit with results is:  Admission  on 12/06/2018, Discharged on 12/06/2018  Component Date Value Ref Range Status  . Specimen Description 12/06/2018    Final                   Value:BLOOD RIGHT ANTECUBITAL Performed at [facility], [place] [address], [place], [place] [address]   . Special Requests 12/06/2018    Final                   Value:BOTTLES DRAWN AEROBIC AND ANAEROBIC Blood Culture adequate volume Performed at [facility] [address] [place], [place] [address]   . Culture 12/06/2018    Final                   Value:NO GROWTH 3 DAYS Performed at [facility], [place] [address] [place], [place] [address]   . Report Status 12/06/2018 PENDING   Incomplete  . Specimen Description 12/06/2018    Final                   Value:BLOOD LEFT ANTECUBITAL Performed at [facility], [place] [address], [place], [place] [address]   . Special Requests 12/06/2018    Final                   Value:BOTTLES DRAWN AEROBIC AND ANAEROBIC Blood Culture adequate volume Performed at [facility] [address] [place], [place] [address]   . Culture 12/06/2018    Final                   Value:NO GROWTH 3 DAYS Performed at [facility], [place] [address], [place], [place] [address]   . Report Status 12/06/2018 PENDING   Incomplete  . Sodium 12/06/2018 139  135 - 145 mmol/L Final  . Potassium 12/06/2018 3.4* 3.5 - 5.1 mmol/L Final  .  Chloride 12/06/2018 105  98 - 111 mmol/L Final  . CO2 12/06/2018 26  22 - 32 mmol/L Final  . Glucose, Bld 12/06/2018 103* 70 - 99 mg/dL Final  . BUN 12/06/2018 10  6 - 20 mg/dL Final  . Creatinine, Ser 12/06/2018 0.66  0.44 - 1.00 mg/dL Final  . Calcium 12/06/2018 8.8* 8.9 - 10.3 mg/dL Final  . Total Protein 12/06/2018 7.4  6.5 - 8.1 g/dL Final  . Albumin 12/06/2018 4.3  3.5 - 5.0 g/dL Final  . AST 12/06/2018 11* 15 - 41 U/L Final  . ALT 12/06/2018 26  0 - 44 U/L Final  . Alkaline Phosphatase 12/06/2018 114  38 - 126 U/L Final  . Total Bilirubin 12/06/2018 0.6  0.3 - 1.2 mg/dL Final  . GFR calc non Af Amer 12/06/2018 >60  >60 mL/min Final  . GFR calc Af Amer 12/06/2018 >60  >60 mL/min Final  . Anion gap 12/06/2018 8  5 - 15 Final   Performed at [facility], [place] [address], [place], [place] [address]  . WBC 12/06/2018 2.6* 4.0 - 10.5 K/uL Final   WHITE COUNT CONFIRMED ON SMEAR  . RBC 12/06/2018 3.93  3.87 - 5.11 MIL/uL Final  . Hemoglobin 12/06/2018 11.2* 12.0 - 15.0 g/dL Final  . HCT 12/06/2018 36.9  36.0 - 46.0 % Final  . MCV 12/06/2018 93.9  80.0 - 100.0 fL Final  . MCH 12/06/2018 28.5  26.0 - 34.0 pg Final  . MCHC 12/06/2018 30.4  30.0 - 36.0 g/dL Final  . RDW 12/06/2018 12.5  11.5 - 15.5 % Final  . Platelets 12/06/2018 158  150 - 400 K/uL Final  . nRBC 12/06/2018 0.0  0.0 - 0.2 % Final  . Neutrophils Relative % 12/06/2018 24  % Final  . Neutro Abs 12/06/2018 0.6* 1.7 - 7.7 K/uL Final  . Lymphocytes Relative 12/06/2018 63  % Final  . Lymphs Abs 12/06/2018 1.6  0.7 - 4.0 K/uL Final  . Monocytes Relative 12/06/2018 5  % Final  . Monocytes Absolute 12/06/2018 0.1  0.1 - 1.0 K/uL Final  . Eosinophils Relative 12/06/2018 2  % Final  . Eosinophils Absolute 12/06/2018 0.1  0.0 - 0.5 K/uL Final  . Basophils Relative 12/06/2018 0  % Final  . Basophils Absolute 12/06/2018 0.0  0.0 - 0.1 K/uL Final  . WBC Morphology 12/06/2018 DOHLE BODIES   Final   Comment: MARKED  LEFT SHIFT (>5% METAS,MYELOS AND PROS, OCC BLAST NOTED) TOXIC GRANULATION   . Metamyelocytes Relative 12/06/2018 1  % Final  . Myelocytes 12/06/2018 3  % Final  . Blasts 12/06/2018 2  % Final  . Abs Immature Granulocytes 12/06/2018 0.10* 0.00 - 0.07 K/uL Final  . Polychromasia 12/06/2018 PRESENT   Final  . Target Cells 12/06/2018 PRESENT   Final   Performed at [facility], [place] [address], [place], [place] [address]  . Color, Urine 12/06/2018 STRAW* YELLOW Final  . APPearance 12/06/2018 CLEAR  CLEAR Final  . Specific Gravity, Urine 12/06/2018 1.009  1.005 - 1.030 Final  . pH 12/06/2018 7.0  5.0 - 8.0 Final  . Glucose, UA 12/06/2018 NEGATIVE  NEGATIVE mg/dL Final  . Hgb urine dipstick 12/06/2018 SMALL* NEGATIVE Final  . Bilirubin Urine 12/06/2018 NEGATIVE  NEGATIVE Final  . Ketones, ur 12/06/2018 NEGATIVE  NEGATIVE mg/dL Final  . Protein, ur 12/06/2018 NEGATIVE  NEGATIVE mg/dL Final  . Nitrite 12/06/2018 NEGATIVE  NEGATIVE Final  . Leukocytes,Ua 12/06/2018 NEGATIVE  NEGATIVE Final  . RBC / HPF 12/06/2018 0-5  0 - 5 RBC/hpf Final  . WBC, UA 12/06/2018 0-5  0 - 5 WBC/hpf Final  . Bacteria, UA 12/06/2018 RARE* NONE SEEN Final  . Squamous Epithelial / LPF 12/06/2018 0-5  0 - 5 Final   Performed at [facility], [place] [address], [place], [place] [address]  . Path Review 12/06/2018 12/06/2018   Final   Comment: CIRCULATING BLASTS PRESENT Reviewed by Arrie Aran L. Melina Copa, M.D. Performed at [facility], [place] [address] [place], [place] [address]     (this displays the last labs from the last 3 days)  No results found for: TOTALPROTELP, ALBUMINELP, A1GS, A2GS, BETS, BETA2SER, GAMS, MSPIKE, SPEI (this displays SPEP labs)  No results found for: KPAFRELGTCHN, LAMBDASER, KAPLAMBRATIO (kappa/lambda light chains)  No results found for: HGBA, HGBA2QUANT, HGBFQUANT, HGBSQUAN (Hemoglobinopathy evaluation)   No results found for: LDH  No  results found for: IRON, TIBC, IRONPCTSAT (Iron and TIBC)  No results found for: FERRITIN  Urinalysis    Component Value Date/Time   COLORURINE STRAW (A) 12/06/2018 1014   APPEARANCEUR CLEAR 12/06/2018 1014   LABSPEC 1.009 12/06/2018 1014   PHURINE 7.0 12/06/2018 1014   GLUCOSEU NEGATIVE 12/06/2018 1014   HGBUR SMALL (A) 12/06/2018 1014   BILIRUBINUR NEGATIVE 12/06/2018 1014   BILIRUBINUR negative 05/31/2018 1011   KETONESUR NEGATIVE 12/06/2018 1014   PROTEINUR NEGATIVE 12/06/2018 1014   UROBILINOGEN 0.2 05/31/2018 1011   UROBILINOGEN 0.2 05/18/2010 1042   NITRITE NEGATIVE 12/06/2018 [place] 12/06/2018 1014  STUDIES:  Dg Chest 2 View  Result Date: 12/06/2018 CLINICAL DATA:  Fever that began last Night, pt states had first chemo infusion 1 week ago, breast ca EXAM: CHEST - 2 VIEW COMPARISON:  11/14/2018 FINDINGS: RIGHT-sided PowerPort tip overlies the superior vena cava. Heart size is normal. There are no focal consolidations or pleural effusions. No pulmonary edema. No suspicious lytic or blastic lesions are identified. IMPRESSION: No evidence for acute cardiopulmonary abnormality. Electronically Signed   By: Nolon Nations M.D.   On: 12/06/2018 08:51   Ct Chest W Contrast  Result Date: 11/15/2018 CLINICAL DATA:  New diagnosis of right breast cancer, presenting for chest staging prior to chemotherapy. EXAM: CT CHEST WITH CONTRAST TECHNIQUE: Multidetector CT imaging of the chest was performed during intravenous contrast administration. CONTRAST:  91m OMNIPAQUE IOHEXOL 300 MG/ML  SOLN COMPARISON:  11/14/2018 chest radiograph. FINDINGS: Cardiovascular: Normal heart size. No significant pericardial effusion/thickening. Right internal jugular Port-A-Cath terminates at the cavoatrial junction. Expected subcutaneous emphysema surrounding Port-A-Cath given recent placement. Great vessels are normal in course and caliber. No central pulmonary emboli.  Mediastinum/Nodes: No discrete thyroid nodules. Unremarkable esophagus. Enlarged 1.1 cm right axillary node (series 2/image 54). No left axillary adenopathy. No mediastinal or hilar adenopathy. Lungs/Pleura: No pneumothorax. No pleural effusion. No acute consolidative airspace disease, lung masses or significant pulmonary nodules. Upper abdomen: There is a poorly marginated 4.9 x 3.1 cm heterogeneous hypodense mass at the posterior right liver margin (series 2/image 106), favor an exophytic liver mass, which abuts upper right kidney and right adrenal gland. There is partial visualization of a poorly marginated hypodense 1.8 x 1.7 cm segment 4B left liver lobe mass (series 2/image 128). Nonobstructing 3 mm upper right renal stone. Musculoskeletal: No aggressive appearing focal osseous lesions. Heterogeneously enhancing posterior right breast 5.6 x 4.5 cm mass (series 2/image 80). IMPRESSION: 1. Posterior right breast 5.6 cm mass compatible with primary right breast malignancy. 2. Right axillary lymphadenopathy compatible with nodal metastatic disease. 3. Two poorly marginated hypodense liver masses, indeterminate, suspicious for liver metastases. Further staging evaluation with CT abdomen/pelvis with oral and IV contrast suggested. PET-CT or MRI abdomen without and with IV contrast may also be considered, as clinically warranted. 4. No pulmonary nodules. Electronically Signed   By: JIlona SorrelM.D.   On: 11/15/2018 12:55   Nm Bone Scan Whole Body  Result Date: 11/15/2018 CLINICAL DATA:  Invasive breast cancer EXAM: NUCLEAR MEDICINE WHOLE BODY BONE SCAN TECHNIQUE: Whole body anterior and posterior images were obtained approximately 3 hours after intravenous injection of radiopharmaceutical. RADIOPHARMACEUTICALS:  22 mCi Technetium-936mDP IV COMPARISON:  None Radiographic correlation: CT chest 11/15/2018 FINDINGS: Minimal uptake of 12 tracer at the shoulders, elbows, sternoclavicular joints, typically  degenerative. No additional sites of abnormal osseous tracer accumulation are identified. Slight rotation of the pelvis. Expected urinary tract and soft tissue distribution of tracer. IMPRESSION: No scintigraphic evidence of osseous metastatic disease. Electronically Signed   By: MaLavonia Dana.D.   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 A borderline abnormal right axillary lymph node was biopsied with no evidence of malignancy. LABS:  None obtained on site today. EXAM: BILATERAL BREAST MRI WITH AND WITHOUT CONTRAST TECHNIQUE: Multiplanar, multisequence MR images of both breasts were obtained prior to and following the intravenous administration of 7  ml of Gadavist Three-dimensional MR images were rendered by post-processing of the original MR data on an independent workstation. The three-dimensional MR images were interpreted, and findings are reported in the following complete MRI report for this study. Three dimensional images were evaluated at the independent DynaCad workstation COMPARISON:  Current and previous mammogram, ultrasound and biopsy examinations. FINDINGS: Breast composition: b. Scattered fibroglandular tissue. Background parenchymal enhancement: Moderate. Right breast: 5.4 x 4.6 x 3.9 cm oval mass with a mildly to moderately thickened rind of peripheral enhancing soft tissue and larger, more confluent irregular enhancing soft tissue component posteriorly in the lower outer quadrant of the right breast. This corresponds to the recently biopsied malignancy. This is abutting the underlying pectoralis muscle and extending into the anterior aspect of the muscle with abnormal enhancement. The mass has a mixture of enhancement kinetics, including a small amount of rapid wash-in/washout kinetics and  larger amount of persistent and plateau kinetics. This masses within a large area of non mass enhancement involving the lower outer quadrant and lower inner quadrant of the right breast and extending from the far anterior aspect of the breast to the far posterior aspect of the breast. This also has some extension into the upper outer and upper inner quadrants and, with the biopsied mass, involve an area measuring 9.6 x 6.8 x 4.9 cm. This has predominantly persistent enhancement kinetics with a small amount of plateau kinetics. Left breast: No mass or abnormal enhancement. Lymph nodes: There is 1 abnormally enlarged right axillary lymph node with abnormal cortical thickening, suspicious for a metastatic node. This corresponds to the recently biopsied node with benign results and contains a biopsy marker artifact. There is a prominent internal mammary vein on the right with no enlarged internal mammary lymph nodes seen. Ancillary findings:  None. IMPRESSION: 1. 5.4 x 4.6 x 3.9 cm biopsy-proven malignancy in the posterior aspect of the lower outer quadrant of the right breast. This is invading the anterior aspect of the underlying pectoralis muscle and has a significant necrotic component. 2. Additional non mass enhancement involving all 4 quadrants of the right breast, extending from the anterior to the posterior aspect of the breast. The non mass enhancement and biopsy-proven malignancy involve a combined area measuring 9.6 x 6.8 x 4.9 cm. The non mass enhancement is concerning four additional ductal carcinoma in situ. 3. Single abnormal appearing right axillary lymph node, corresponding to the recently biopsied lymph node with benign results. Despite the recent benign biopsy results, this is suspicious for a possible metastatic node. 4. No evidence of malignancy the on the left. RECOMMENDATION: 6: Treatment plan. BI-RADS CATEGORY  6: Known biopsy-proven malignancy. Electronically Signed   By: Claudie Revering M.D.   On:  11/12/2018 16:06   US Biopsy (liver)  Result Date: 11/25/2018 INDICATION: 48 year old female with a history of right lower outer quadrant breast cancer and evidence of nonspecific low-attenuation lesions within the liver on CT scan of the chest. She presents for ultrasound-guided biopsy to confirm tissue diagnosis of suspected breast cancer metastases to the liver. EXAM: Ultrasound-guided core biopsy, liver lesion MEDICATIONS: None. ANESTHESIA/SEDATION: Moderate (conscious) sedation was employed during this procedure. A total of Versed 4 mg and Fentanyl 100 mcg was administered intravenously. Moderate Sedation Time: 18 minutes. The patient's level of consciousness and vital signs were monitored continuously by radiology nursing throughout the procedure under my direct supervision. FLUOROSCOPY TIME:  None COMPLICATIONS: None immediate. PROCEDURE: Informed written consent was obtained from the patient after a thorough discussion of  the procedural risks, benefits and alternatives. All questions were addressed. A timeout was performed prior to the initiation of the procedure. The liver was interrogated with ultrasound. To lesions are identified. The lesions are somewhat indistinct and hyperechoic in appearance. The lesion in hepatic segment 4 measures approximately 1.9 x 1.6 x 2.1 cm. The sonographic appearance is somewhat suggestive of a benign hemangioma. However, given the clinical history of breast cancer, the decision was made to proceed with biopsy. Ultrasound was used to interrogate the right upper quadrant. The liver was successfully identified. A suitable skin entry site to approach the segment 4 lesion was selected and marked. The region was sterilely prepped and draped in the standard fashion using chlorhexidine skin prep. Local anesthesia was attained by infiltration with 1% lidocaine. A small dermatotomy was made. Under real-time sonographic guidance, a 17 gauge introducer needle was carefully advanced  into the margin of the mass. Multiple 18 gauge core biopsies were then coaxially obtained using the bio Pince automated biopsy device. Biopsy specimens were placed in formalin and delivered to pathology for further analysis. As the introducer needle was removed, the biopsy tract was embolized with a Gel-Foam slurry. Post biopsy ultrasound imaging demonstrates no evidence of acute bleeding, perihepatic hematoma or other complication. The patient tolerated the procedure well. IMPRESSION: Technically successful ultrasound-guided core biopsy of hepatic lesion in segment 4. Of note, the imaging appearance is somewhat suggestive of a benign hemangioma. Signed, Criselda Peaches, MD, [facility] Radiology Electronically Signed   By: Jacqulynn Cadet M.D.   On: 11/25/2018 15:22   Dg Chest Port 1 View  Result Date: 11/14/2018 CLINICAL DATA:  Port-A-Cath placement. EXAM: PORTABLE CHEST 1 VIEW COMPARISON:  06/09/2008 FINDINGS: Right IJ approach central venous catheter terminates in the expected location of the cavoatrial junction. Cardiomediastinal silhouette is normal. Mediastinal contours appear intact. There is no evidence of focal airspace consolidation, pleural effusion or pneumothorax. Osseous structures are without acute abnormality. Soft tissues are grossly normal. IMPRESSION: Right IJ approach central venous catheter terminates in the expected location of the cavoatrial junction. No evidence of pneumothorax. Electronically Signed   By: Fidela Salisbury M.D.   On: 11/14/2018 12:39   Dg Fluoro Guide Cv Line-no Report  Result Date: 11/14/2018 Fluoroscopy was utilized by the requesting physician.  No radiographic interpretation.    ELIGIBLE FOR AVAILABLE RESEARCH PROTOCOL: Declined   ASSESSMENT: 48 y.o. [place], [place] woman status post right breast lower outer quadrant biopsy 10/29/2018 for a clinical T2 N0, stage IIB invasive ductal carcinoma,  grade 3, triple negative, with an MIB-1 of 80%  (a) CT of the chest 11/15/2018 shows possible liver metastases, biopsy 11/25/2018 consistent with hemangioma  (b) bone scan 11/15/2018 shows no suspicious bone lesions  (1) neoadjuvant chemotherapy will consist of doxorubicin and cyclophosphamide in dose dense fashion x4, starting 11/26/2018 to be followed by weekly paclitaxel and carboplatin x12  (2) patient desires ovarian function preservation: Zoladex started 11/08/2018  (3) definitive surgery to follow chemo  (4) adjuvant radiation to follow  (5) genetics testing 11/28/2018   PLAN: Brittany Rodgers tolerated her first cycle of chemotherapy moderately well.  She did have some aches and pains, some reflux symptoms, and some nausea.  Though she had a fever a few days ago, there has been no exposure and no travel and no respiratory symptoms.  The fever resolved with no intervention.  She should have received her Udenyca on day 3 but for reasons that are still not entirely  clear that was not done.  We have placed a safety zone portal regarding that.  She did receive Granix x3.  Assuming her counts are okay today she will be treated with the second cycle of doxorubicin and cyclophosphamide and I made sure she is aware that when she comes 2 days from now to get her shot if there is any problem she needs to speak either to me or my nurse know we can clear up any issues  She will then see [name] again in 1 week to make sure she is tolerating treatment well and 2 weeks from now for cycle 3  She knows to call for any other issue that may develop before the next visit.  , [name], MD  12/10/18 8:57 AM Medical Oncology and Hematology [facility] [address] [place], [place] [address] Tel. [phone]    Fax. [phone]  I, Jacqualyn Posey am acting as a Education administrator for Chauncey Cruel, MD.   I, Lurline Del MD, have reviewed the above documentation for accuracy and completeness,  and I agree with the above.

## 2018-12-10 ENCOUNTER — Inpatient Hospital Stay: Payer: BLUE CROSS/BLUE SHIELD

## 2018-12-10 ENCOUNTER — Inpatient Hospital Stay (HOSPITAL_BASED_OUTPATIENT_CLINIC_OR_DEPARTMENT_OTHER): Payer: BLUE CROSS/BLUE SHIELD | Admitting: Oncology

## 2018-12-10 ENCOUNTER — Other Ambulatory Visit: Payer: Self-pay

## 2018-12-10 VITALS — BP 126/84 | HR 86 | Temp 98.9°F | Resp 18 | Ht 62.0 in | Wt 163.4 lb

## 2018-12-10 DIAGNOSIS — Z95828 Presence of other vascular implants and grafts: Secondary | ICD-10-CM

## 2018-12-10 DIAGNOSIS — Z171 Estrogen receptor negative status [ER-]: Principal | ICD-10-CM

## 2018-12-10 DIAGNOSIS — C50511 Malignant neoplasm of lower-outer quadrant of right female breast: Secondary | ICD-10-CM | POA: Diagnosis not present

## 2018-12-10 DIAGNOSIS — N200 Calculus of kidney: Secondary | ICD-10-CM

## 2018-12-10 DIAGNOSIS — Z5111 Encounter for antineoplastic chemotherapy: Secondary | ICD-10-CM | POA: Diagnosis not present

## 2018-12-10 DIAGNOSIS — Z79899 Other long term (current) drug therapy: Secondary | ICD-10-CM

## 2018-12-10 DIAGNOSIS — R05 Cough: Secondary | ICD-10-CM

## 2018-12-10 DIAGNOSIS — Z803 Family history of malignant neoplasm of breast: Secondary | ICD-10-CM

## 2018-12-10 DIAGNOSIS — K589 Irritable bowel syndrome without diarrhea: Secondary | ICD-10-CM

## 2018-12-10 DIAGNOSIS — K219 Gastro-esophageal reflux disease without esophagitis: Secondary | ICD-10-CM

## 2018-12-10 DIAGNOSIS — E785 Hyperlipidemia, unspecified: Secondary | ICD-10-CM

## 2018-12-10 DIAGNOSIS — Z7689 Persons encountering health services in other specified circumstances: Secondary | ICD-10-CM | POA: Diagnosis not present

## 2018-12-10 DIAGNOSIS — G479 Sleep disorder, unspecified: Secondary | ICD-10-CM

## 2018-12-10 DIAGNOSIS — R590 Localized enlarged lymph nodes: Secondary | ICD-10-CM

## 2018-12-10 DIAGNOSIS — R160 Hepatomegaly, not elsewhere classified: Secondary | ICD-10-CM

## 2018-12-10 LAB — CBC WITH DIFFERENTIAL/PLATELET
Abs Immature Granulocytes: 0.7 K/uL — ABNORMAL HIGH (ref 0.00–0.07)
Band Neutrophils: 15 %
Basophils Absolute: 0 K/uL (ref 0.0–0.1)
Basophils Relative: 0 %
Eosinophils Absolute: 0.1 K/uL (ref 0.0–0.5)
Eosinophils Relative: 1 %
HCT: 33.3 % — ABNORMAL LOW (ref 36.0–46.0)
Hemoglobin: 10.5 g/dL — ABNORMAL LOW (ref 12.0–15.0)
Lymphocytes Relative: 19 %
Lymphs Abs: 1.7 K/uL (ref 0.7–4.0)
MCH: 29 pg (ref 26.0–34.0)
MCHC: 31.5 g/dL (ref 30.0–36.0)
MCV: 92 fL (ref 80.0–100.0)
Metamyelocytes Relative: 6 %
Monocytes Absolute: 0.3 K/uL (ref 0.1–1.0)
Monocytes Relative: 4 %
Myelocytes: 2 %
Neutro Abs: 5.9 K/uL (ref 1.7–17.7)
Neutrophils Relative %: 53 %
Platelets: 171 K/uL (ref 150–400)
RBC: 3.62 MIL/uL — ABNORMAL LOW (ref 3.87–5.11)
RDW: 13.1 % (ref 11.5–15.5)
WBC: 8.7 K/uL (ref 4.0–10.5)
nRBC: 0.7 % — ABNORMAL HIGH (ref 0.0–0.2)

## 2018-12-10 LAB — COMPREHENSIVE METABOLIC PANEL WITH GFR
ALT: 19 U/L (ref 0–44)
AST: 8 U/L — ABNORMAL LOW (ref 15–41)
Albumin: 3.6 g/dL (ref 3.5–5.0)
Alkaline Phosphatase: 109 U/L (ref 38–126)
Anion gap: 10 (ref 5–15)
BUN: 11 mg/dL (ref 6–20)
CO2: 25 mmol/L (ref 22–32)
Calcium: 9.1 mg/dL (ref 8.9–10.3)
Chloride: 106 mmol/L (ref 98–111)
Creatinine, Ser: 0.72 mg/dL (ref 0.44–1.00)
GFR calc Af Amer: >60 mL/min (ref >60)
GFR calc non Af Amer: >60 mL/min (ref >60)
Glucose, Bld: 119 mg/dL — ABNORMAL HIGH (ref 70–99)
Potassium: 3.9 mmol/L (ref 3.5–5.1)
Sodium: 141 mmol/L (ref 135–145)
Total Bilirubin: <0.2 mg/dL — ABNORMAL LOW (ref 0.3–1.2)
Total Protein: 6.9 g/dL (ref 6.5–8.1)

## 2018-12-10 MED ORDER — PALONOSETRON HCL INJECTION 0.25 MG/5ML
INTRAVENOUS | Status: AC
  Filled 2018-12-10: qty 5

## 2018-12-10 MED ORDER — HEPARIN SOD (PORK) LOCK FLUSH 100 UNIT/ML IV SOLN
500.0000 [IU] | Freq: Once | INTRAVENOUS | Status: AC | PRN
  Administered 2018-12-10: 500 [IU]
  Filled 2018-12-10: qty 5

## 2018-12-10 MED ORDER — SODIUM CHLORIDE 0.9 % IV SOLN
Freq: Once | INTRAVENOUS | Status: AC
  Administered 2018-12-10: 11:00:00 via INTRAVENOUS
  Filled 2018-12-10: qty 5

## 2018-12-10 MED ORDER — SODIUM CHLORIDE 0.9 % IV SOLN
Freq: Once | INTRAVENOUS | Status: AC
  Administered 2018-12-10: 10:00:00 via INTRAVENOUS
  Filled 2018-12-10: qty 250

## 2018-12-10 MED ORDER — DOXORUBICIN HCL CHEMO IV INJECTION 2 MG/ML
60.0000 mg/m2 | Freq: Once | INTRAVENOUS | Status: AC
  Administered 2018-12-10: 106 mg via INTRAVENOUS
  Filled 2018-12-10: qty 53

## 2018-12-10 MED ORDER — PALONOSETRON HCL INJECTION 0.25 MG/5ML
0.2500 mg | Freq: Once | INTRAVENOUS | Status: AC
  Administered 2018-12-10: 0.25 mg via INTRAVENOUS

## 2018-12-10 MED ORDER — SODIUM CHLORIDE 0.9% FLUSH
10.0000 mL | INTRAVENOUS | Status: DC | PRN
  Filled 2018-12-10: qty 10

## 2018-12-10 MED ORDER — SODIUM CHLORIDE 0.9% FLUSH
10.0000 mL | INTRAVENOUS | Status: DC | PRN
  Administered 2018-12-10 (×2): 10 mL via INTRAVENOUS
  Filled 2018-12-10: qty 10

## 2018-12-10 MED ORDER — SODIUM CHLORIDE 0.9 % IV SOLN
600.0000 mg/m2 | Freq: Once | INTRAVENOUS | Status: AC
  Administered 2018-12-10: 1060 mg via INTRAVENOUS
  Filled 2018-12-10: qty 53

## 2018-12-10 NOTE — Patient Instructions (Signed)

## 2018-12-10 NOTE — Patient Instructions (Signed)
[facility] Discharge Instructions for Patients Receiving Chemotherapy  Today you received the following chemotherapy agents: doxorubicin (Adriamycin) and cyclophosphamide (Cytoxan)   To help prevent nausea and vomiting after your treatment, we encourage you to take your nausea medication as prescribed by your physician.   If you develop nausea and vomiting that is not controlled by your nausea medication, call the clinic.   BELOW ARE SYMPTOMS THAT SHOULD BE REPORTED IMMEDIATELY:  *FEVER GREATER THAN 100.5 F  *CHILLS WITH OR WITHOUT FEVER  NAUSEA AND VOMITING THAT IS NOT CONTROLLED WITH YOUR NAUSEA MEDICATION  *UNUSUAL SHORTNESS OF BREATH  *UNUSUAL BRUISING OR BLEEDING  TENDERNESS IN MOUTH AND THROAT WITH OR WITHOUT PRESENCE OF ULCERS  *URINARY PROBLEMS  *BOWEL PROBLEMS  UNUSUAL RASH Items with * indicate a potential emergency and should be followed up as soon as possible.  Feel free to call the clinic should you have any questions or concerns. The clinic phone number is [phone].  Please show the CHEMO ALERT CARD at check-in to the Emergency Department and triage nurse.   

## 2018-12-11 LAB — CULTURE, BLOOD (ROUTINE X 2)
Culture: NO GROWTH
Culture: NO GROWTH
SPECIAL REQUESTS: ADEQUATE
Special Requests: ADEQUATE

## 2018-12-12 ENCOUNTER — Inpatient Hospital Stay: Payer: BLUE CROSS/BLUE SHIELD

## 2018-12-12 ENCOUNTER — Other Ambulatory Visit: Payer: Self-pay

## 2018-12-12 VITALS — BP 145/93 | HR 83 | Resp 18

## 2018-12-12 DIAGNOSIS — C50511 Malignant neoplasm of lower-outer quadrant of right female breast: Secondary | ICD-10-CM

## 2018-12-12 DIAGNOSIS — Z171 Estrogen receptor negative status [ER-]: Principal | ICD-10-CM

## 2018-12-12 DIAGNOSIS — Z5111 Encounter for antineoplastic chemotherapy: Secondary | ICD-10-CM | POA: Diagnosis not present

## 2018-12-12 MED ORDER — PEGFILGRASTIM-CBQV 6 MG/0.6ML ~~LOC~~ SOSY
PREFILLED_SYRINGE | SUBCUTANEOUS | Status: AC
  Filled 2018-12-12: qty 0.6

## 2018-12-12 MED ORDER — PEGFILGRASTIM-CBQV 6 MG/0.6ML ~~LOC~~ SOSY
6.0000 mg | PREFILLED_SYRINGE | Freq: Once | SUBCUTANEOUS | Status: AC
  Administered 2018-12-12: 6 mg via SUBCUTANEOUS

## 2018-12-12 NOTE — Patient Instructions (Signed)
Pegfilgrastim injection  What is this medicine?  PEGFILGRASTIM (PEG fil gra stim) is a long-acting granulocyte colony-stimulating factor that stimulates the growth of neutrophils, a type of white blood cell important in the body's fight against infection. It is used to reduce the incidence of fever and infection in patients with certain types of cancer who are receiving chemotherapy that affects the bone marrow, and to increase survival after being exposed to high doses of radiation.  This medicine may be used for other purposes; ask your health care provider or pharmacist if you have questions.  COMMON BRAND NAME(S): Fulphila, Neulasta, UDENYCA  What should I tell my health care provider before I take this medicine?  They need to know if you have any of these conditions:  -kidney disease  -latex allergy  -ongoing radiation therapy  -sickle cell disease  -skin reactions to acrylic adhesives (On-Body Injector only)  -an unusual or allergic reaction to pegfilgrastim, filgrastim, other medicines, foods, dyes, or preservatives  -pregnant or trying to get pregnant  -breast-feeding  How should I use this medicine?  This medicine is for injection under the skin. If you get this medicine at home, you will be taught how to prepare and give the pre-filled syringe or how to use the On-body Injector. Refer to the patient Instructions for Use for detailed instructions. Use exactly as directed. Tell your healthcare provider immediately if you suspect that the On-body Injector may not have performed as intended or if you suspect the use of the On-body Injector resulted in a missed or partial dose.  It is important that you put your used needles and syringes in a special sharps container. Do not put them in a trash can. If you do not have a sharps container, call your pharmacist or healthcare provider to get one.  Talk to your pediatrician regarding the use of this medicine in children. While this drug may be prescribed for  selected conditions, precautions do apply.  Overdosage: If you think you have taken too much of this medicine contact a poison control center or emergency room at once.  NOTE: This medicine is only for you. Do not share this medicine with others.  What if I miss a dose?  It is important not to miss your dose. Call your doctor or health care professional if you miss your dose. If you miss a dose due to an On-body Injector failure or leakage, a new dose should be administered as soon as possible using a single prefilled syringe for manual use.  What may interact with this medicine?  Interactions have not been studied.  Give your health care provider a list of all the medicines, herbs, non-prescription drugs, or dietary supplements you use. Also tell them if you smoke, drink alcohol, or use illegal drugs. Some items may interact with your medicine.  This list may not describe all possible interactions. Give your health care provider a list of all the medicines, herbs, non-prescription drugs, or dietary supplements you use. Also tell them if you smoke, drink alcohol, or use illegal drugs. Some items may interact with your medicine.  What should I watch for while using this medicine?  You may need blood work done while you are taking this medicine.  If you are going to need a MRI, CT scan, or other procedure, tell your doctor that you are using this medicine (On-Body Injector only).  What side effects may I notice from receiving this medicine?  Side effects that you should report to   your doctor or health care professional as soon as possible:  -allergic reactions like skin rash, itching or hives, swelling of the face, lips, or tongue  -back pain  -dizziness  -fever  -pain, redness, or irritation at site where injected  -pinpoint red spots on the skin  -red or dark-brown urine  -shortness of breath or breathing problems  -stomach or side pain, or pain at the shoulder  -swelling  -tiredness  -trouble passing urine or  change in the amount of urine  Side effects that usually do not require medical attention (report to your doctor or health care professional if they continue or are bothersome):  -bone pain  -muscle pain  This list may not describe all possible side effects. Call your doctor for medical advice about side effects. You may report side effects to FDA at 1-800-FDA-1088.  Where should I keep my medicine?  Keep out of the reach of children.  If you are using this medicine at home, you will be instructed on how to store it. Throw away any unused medicine after the expiration date on the label.  NOTE: This sheet is a summary. It may not cover all possible information. If you have questions about this medicine, talk to your doctor, pharmacist, or health care provider.   2019 [employer] Standard (2017-12-17 16:57:08)

## 2018-12-17 ENCOUNTER — Inpatient Hospital Stay (HOSPITAL_BASED_OUTPATIENT_CLINIC_OR_DEPARTMENT_OTHER): Payer: BLUE CROSS/BLUE SHIELD | Admitting: Adult Health

## 2018-12-17 ENCOUNTER — Other Ambulatory Visit: Payer: BLUE CROSS/BLUE SHIELD

## 2018-12-17 ENCOUNTER — Encounter: Payer: Self-pay | Admitting: Adult Health

## 2018-12-17 ENCOUNTER — Other Ambulatory Visit: Payer: Self-pay

## 2018-12-17 ENCOUNTER — Ambulatory Visit: Payer: BLUE CROSS/BLUE SHIELD

## 2018-12-17 ENCOUNTER — Inpatient Hospital Stay: Payer: BLUE CROSS/BLUE SHIELD

## 2018-12-17 ENCOUNTER — Telehealth: Payer: Self-pay

## 2018-12-17 VITALS — BP 122/63 | HR 92 | Temp 98.8°F | Resp 18 | Ht 62.0 in | Wt 165.0 lb

## 2018-12-17 DIAGNOSIS — Z7689 Persons encountering health services in other specified circumstances: Secondary | ICD-10-CM

## 2018-12-17 DIAGNOSIS — R110 Nausea: Secondary | ICD-10-CM

## 2018-12-17 DIAGNOSIS — K5900 Constipation, unspecified: Secondary | ICD-10-CM

## 2018-12-17 DIAGNOSIS — C50511 Malignant neoplasm of lower-outer quadrant of right female breast: Secondary | ICD-10-CM

## 2018-12-17 DIAGNOSIS — Z171 Estrogen receptor negative status [ER-]: Principal | ICD-10-CM

## 2018-12-17 DIAGNOSIS — Z803 Family history of malignant neoplasm of breast: Secondary | ICD-10-CM

## 2018-12-17 DIAGNOSIS — R590 Localized enlarged lymph nodes: Secondary | ICD-10-CM

## 2018-12-17 DIAGNOSIS — K219 Gastro-esophageal reflux disease without esophagitis: Secondary | ICD-10-CM

## 2018-12-17 DIAGNOSIS — N200 Calculus of kidney: Secondary | ICD-10-CM

## 2018-12-17 DIAGNOSIS — Z5111 Encounter for antineoplastic chemotherapy: Secondary | ICD-10-CM | POA: Diagnosis not present

## 2018-12-17 DIAGNOSIS — Z79899 Other long term (current) drug therapy: Secondary | ICD-10-CM

## 2018-12-17 DIAGNOSIS — E785 Hyperlipidemia, unspecified: Secondary | ICD-10-CM

## 2018-12-17 DIAGNOSIS — G479 Sleep disorder, unspecified: Secondary | ICD-10-CM

## 2018-12-17 DIAGNOSIS — R160 Hepatomegaly, not elsewhere classified: Secondary | ICD-10-CM

## 2018-12-17 DIAGNOSIS — R05 Cough: Secondary | ICD-10-CM

## 2018-12-17 DIAGNOSIS — K589 Irritable bowel syndrome without diarrhea: Secondary | ICD-10-CM

## 2018-12-17 LAB — COMPREHENSIVE METABOLIC PANEL
ALT: 30 U/L (ref 0–44)
ANION GAP: 11 (ref 5–15)
AST: 7 U/L — ABNORMAL LOW (ref 15–41)
Albumin: 3.7 g/dL (ref 3.5–5.0)
Alkaline Phosphatase: 121 U/L (ref 38–126)
BUN: 12 mg/dL (ref 6–20)
CO2: 30 mmol/L (ref 22–32)
Calcium: 9.4 mg/dL (ref 8.9–10.3)
Chloride: 101 mmol/L (ref 98–111)
Creatinine, Ser: 0.82 mg/dL (ref 0.44–1.00)
GFR calc Af Amer: >60 mL/min (ref >60)
GFR calc non Af Amer: >60 mL/min (ref >60)
Glucose, Bld: 106 mg/dL — ABNORMAL HIGH (ref 70–99)
Potassium: 3.7 mmol/L (ref 3.5–5.1)
SODIUM: 142 mmol/L (ref 135–145)
Total Bilirubin: 0.4 mg/dL (ref 0.3–1.2)
Total Protein: 6.8 g/dL (ref 6.5–8.1)

## 2018-12-17 LAB — CBC WITH DIFFERENTIAL/PLATELET
Abs Immature Granulocytes: 0.43 10*3/uL — ABNORMAL HIGH (ref 0.00–0.07)
Basophils Absolute: 0.1 10*3/uL (ref 0.0–0.1)
Basophils Relative: 2 %
Eosinophils Absolute: 0 10*3/uL (ref 0.0–0.5)
Eosinophils Relative: 0 %
HCT: 35.4 % — ABNORMAL LOW (ref 36.0–46.0)
Hemoglobin: 11.2 g/dL — ABNORMAL LOW (ref 12.0–15.0)
Immature Granulocytes: 6 %
Lymphocytes Relative: 17 %
Lymphs Abs: 1.2 10*3/uL (ref 0.7–4.0)
MCH: 29.3 pg (ref 26.0–34.0)
MCHC: 31.6 g/dL (ref 30.0–36.0)
MCV: 92.7 fL (ref 80.0–100.0)
Monocytes Absolute: 0.6 10*3/uL (ref 0.1–1.0)
Monocytes Relative: 8 %
NEUTROS PCT: 67 %
Neutro Abs: 4.5 10*3/uL (ref 1.7–7.7)
PLATELETS: 221 10*3/uL (ref 150–400)
RBC: 3.82 MIL/uL — ABNORMAL LOW (ref 3.87–5.11)
RDW: 13.2 % (ref 11.5–15.5)
WBC MORPHOLOGY: INCREASED
WBC: 6.8 10*3/uL (ref 4.0–10.5)
nRBC: 0 % (ref 0.0–0.2)

## 2018-12-17 NOTE — Progress Notes (Signed)
[place]  [phone] [phone]    ID: Brittany Rodgers DOB: 16-Apr-1971  MR#: [id number]  QQV#:[id number]  Patient Care Team: Brittany Morale, MD as PCP - General Brittany Bookbinder, MD as Consulting Physician (General Surgery) Brittany Rodgers, Brittany Dad, MD as Consulting Physician (Oncology) Brittany Gibson, MD as Attending Physician (Radiation Oncology) Brittany Mayer, MD as Consulting Physician (Gastroenterology) Brittany Millers, MD as Consulting Physician (Obstetrics and Gynecology) OTHER MD: Brittany Rodgers (urology)    CHIEF COMPLAINT: Triple negative breast cancer  CURRENT TREATMENT: Neoadjuvant chemotherapy, goserelin  HISTORY OF CURRENT ILLNESS: From the original intake note:  Brittany Rodgers presented with a palpable right breast lump and associated tenderness for approximately 1 week. She called her doctor immediately following  She underwent unilateral right diagnostic mammography with tomography and right breast ultrasonography at [facility] on 10/28/2018 showing: Breast Density Category C. Radiopaque BB was placed at the site of the patient's palpable lump in the central right breast. An irregular hyperdense mass with associated increased trabecular thickening is seen in the deep central right breast. On physical exam, a firm, fixed mass in the central right breast is palpated. Target ultrasound is performed, showing and irregular hypoechoic mass at the 8 o'clock position 3 cm from the nipple. It measures 3.6 x 2.9 x 2.3 cm. There is associated peripheral vascularity. Evaluation of the right axilla demonstrates a single prominent lymph node demonstrating 0.4 cm cortical thickening. The remaining lymph nodes ave normal appearance with a thin, symmetric cortex.   Accordingly on 10/29/2018 she proceeded to biopsy of the right breast area in question. The pathology from this procedure showed (Brittany Rodgers): invasive ductal carcinoma, grade III. Prognostic  indicators significant for: estrogen receptor, 0% negative and progesterone receptor, 0% negative. Proliferation marker Ki67 at 80%. HER2 negative (1+) by immunohistochemistry.  Biopsy of the right axilla was performed on the same day. The pathology from this procedure showed (Brittany Rodgers): lymph node, negative for carcinoma.  This was felt to be concordant  The patient's subsequent history is as detailed below.   INTERVAL HISTORY: Brittany Rodgers returns today for follow-up and treatment of her triple negative breast cancer.  She continues on neoadjuvant chemotherapy receiving Doxorubicin and Cyclophosphamide given on day 1 of a 14 day cycle, with udenyca support on day 3. Today is day 8 cycle 2. Brittany Rodgers is doing moderately well today.    REVIEW OF SYSTEMS: Brittany Rodgers is tired today.   She has noted some nail changes.  She has had some nasuea.  She says the nausea medication works somewhat.  She ntoes that she is most nauseated on the fourth day following chemotherapy.  She has taste changes.  She is eating and drinking.  She is constipated.  Her LBM was this morning, but it was hard and small.  She is taking Miralax and started that yesterday.  She has taken two doses of miralax so far.   Brittany Rodgers denies fevers, chills, cough, shortness of breath, chest pain, or palpitations.  She has some clear nasal drainage, that is intermittent throughout the day.  She says it has increased over the past two days. She does have some muscle aching.  She notes that last time, at the end of her granix injections she had the aching.  With this one (since she received Udenyca with this cycle) she noted the aching started yesterday.    Brittany Rodgers is having a difficult time sleeping.  She has taken some Lorazepam and hasn't taken any other than what  is on her map, she has avoided taking it.  Brittany Rodgers has been making herself do things, but has not felt like doing the stuff she needs to do.  Brittany Rodgers is not working.  She is living at  home with her husband.  Her husband is a Administrator, and due to coronavirus his company has temporarily stopped deliveries.  He was told to apply for unemployment due to that.     PAST MEDICAL HISTORY: Past Medical History:  Diagnosis Date  . Chronic cough   . Family history of breast cancer   . GERD (gastroesophageal reflux disease)   . Hyperlipidemia   . IBS (irritable bowel syndrome)    sees Dr. Silvano Rodgers   . Routine gynecological examination    sees Dr. Freda Rodgers     PAST SURGICAL HISTORY: Past Surgical History:  Procedure Laterality Date  . CESAREAN SECTION    . PORTACATH PLACEMENT Right 11/14/2018   Procedure: INSERTION PORT-A-CATH WITH ULTRASOUND;  Surgeon: Brittany Bookbinder, MD;  Location: [facility];  Service: General;  Laterality: Right;  . ROOT CANAL       FAMILY HISTORY: Family History  Problem Relation Age of Onset  . Hypertension Other   . Breast cancer Other        mat first cousin's daughter  . Breast cancer Maternal Aunt   . Hypertension Mother   . Breast cancer Maternal Aunt   . Breast cancer Maternal Aunt    Brittany Rodgers's father died from an unknown cause at age 52. [name] died from unknown health complications at age 33. The patient has 4 sisters. Brittany Rodgers has 3 maternal aunts that have been diagnosed with breast cancer. Patient denies anyone in her family having ovarian, prostate, or pancreatic cancer.    GYNECOLOGIC HISTORY:  No LMP recorded. 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 Her husband, Brittany Rodgers, is a Administrator. The patient has two children, Brittany Rodgers and Brittany Rodgers, from another relationship. Brittany Rodgers is 67, lives in [place]. Brittany Rodgers is 34, lives in [place], and is in [employer].  The patient's husband Brittany Rodgers has a child, Brittany Rodgers, from another  relationship; she lives in [place] and is in school. The patient has 5 grandchildren. She attends the [employer].   ADVANCED DIRECTIVES: Brittany Rodgers's husband, Brittany Rodgers, is automatically her healthcare power of attorney.     HEALTH MAINTENANCE: Social History   Tobacco Use  . Smoking status: Never Smoker  . Smokeless tobacco: Never Used  Substance Use Topics  . Alcohol use: No    Alcohol/week: 0.0 standard drinks  . Drug use: No    Colonoscopy: yes  PAP: 02/2018  Bone density: no   Allergies  Allergen Reactions  . Barbiturates Other (See Comments)    Keeps pt awake  . Sulfonamide Derivatives Itching    Current Outpatient Medications  Medication Sig Dispense Refill  . atorvastatin (LIPITOR) 20 MG tablet Take 1 tablet (20 mg total) by mouth daily. 90 tablet 3  . cyclobenzaprine (FLEXERIL) 10 MG tablet TAKE 1 TABLET BY MOUTH THREE TIMES DAILY AS NEEDED FOR MUSCLE SPASMS 90 tablet 0  . dexamethasone (DECADRON) 4 MG tablet Take 2 tablets by mouth once a day on the day after chemotherapy and then take 2 tablets two times a day for 2 days. Take with food. 30 tablet  1  . lidocaine-prilocaine (EMLA) cream Apply to affected area once 30 g 3  . loratadine (CLARITIN) 10 MG tablet Take 1 tablet (10 mg total) by mouth daily. 60 tablet 0  . LORazepam (ATIVAN) 0.5 MG tablet Take 1 tablet (0.5 mg total) by mouth at bedtime as needed (Nausea or vomiting). 30 tablet 0  . Omeprazole-Sodium Bicarbonate (ZEGERID) 20-1100 MG CAPS capsule Take 1 capsule by mouth daily before breakfast. 90 each 3  . polyethylene glycol (MIRALAX / GLYCOLAX) packet Take 17 g by mouth as needed. (Patient taking differently: Take 17 g by mouth daily as needed (constipation). Mix in 8 oz liquid and drink) 100 each 5  . potassium chloride (K-DUR) 10 MEQ tablet TAKE 1 TABLET BY MOUTH ONCE DAILY 90 tablet 3  . prochlorperazine (COMPAZINE) 10 MG tablet Take 1 tablet (10 mg total) by mouth every 6 (six) hours as needed (Nausea or  vomiting). 30 tablet 1  . traMADol (ULTRAM) 50 MG tablet Take 1-2 tablets (50-100 mg total) by mouth every 6 (six) hours as needed for moderate pain. 20 tablet 0   No current facility-administered medications for this visit.      OBJECTIVE:   Vitals:   12/17/18 1334  BP: 122/63  Pulse: 92  Resp: 18  Temp: 98.8 F (37.1 C)  SpO2: 100%     Body mass index is 30.18 kg/m.   Wt Readings from Last 3 Encounters:  12/17/18 165 lb (74.8 kg)  12/10/18 163 lb 6.4 oz (74.1 kg)  12/06/18 164 lb (74.4 kg)   ECOG FS:1 - Symptomatic but completely ambulatory GENERAL: Patient is a well appearing female in no acute distress HEENT:  Sclerae anicteric.  Oropharynx clear and moist. No ulcerations or evidence of oropharyngeal candidiasis. Neck is supple.  NODES:  No cervical, supraclavicular, or axillary lymphadenopathy palpated.  BREAST EXAM:  Deferred. LUNGS:  Clear to auscultation bilaterally.  No wheezes or rhonchi. HEART:  Regular rate and rhythm. No murmur appreciated. ABDOMEN:  Soft, nontender.  Positive, normoactive bowel sounds. No organomegaly palpated. MSK:  No focal spinal tenderness to palpation. Full range of motion bilaterally in the upper extremities. EXTREMITIES:  No peripheral edema.   SKIN:  Clear with no obvious rashes or skin changes. No nail dyscrasia. NEURO:  Nonfocal. Well oriented.  Appropriate affect.        LAB RESULTS:  CMP     Component Value Date/Time   NA 141 12/10/2018 0852   K 3.9 12/10/2018 0852   CL 106 12/10/2018 0852   CO2 25 12/10/2018 0852   GLUCOSE 119 (H) 12/10/2018 0852   BUN 11 12/10/2018 0852   CREATININE 0.72 12/10/2018 0852   CREATININE 0.86 11/06/2018 0821   CALCIUM 9.1 12/10/2018 0852   PROT 6.9 12/10/2018 0852   ALBUMIN 3.6 12/10/2018 0852   AST 8 (L) 12/10/2018 0852   AST 11 (L) 11/06/2018 0821   ALT 19 12/10/2018 0852   ALT 25 11/06/2018 0821   ALKPHOS 109 12/10/2018 0852   BILITOT <0.2 (L) 12/10/2018 0852   BILITOT 0.5  11/06/2018 0821   GFRNONAA >60 12/10/2018 0852   GFRNONAA >60 11/06/2018 0821   GFRAA >60 12/10/2018 0852   GFRAA >60 11/06/2018 0821    No results found for: TOTALPROTELP, ALBUMINELP, A1GS, A2GS, BETS, BETA2SER, GAMS, MSPIKE, SPEI  No results found for: KPAFRELGTCHN, LAMBDASER, [facility]  Lab Results  Component Value Date   WBC 6.8 12/17/2018   NEUTROABS PENDING 12/17/2018   HGB 11.2 (L) 12/17/2018  HCT 35.4 (L) 12/17/2018   MCV 92.7 12/17/2018   PLT 221 12/17/2018    '@LASTCHEMISTRY'$ @  No results found for: LABCA2  No components found for: KDXIPJ825  No results for input(s): INR in the last 168 hours.  No results found for: LABCA2  No results found for: KNL976  No results found for: BHA193  No results found for: XTK240  No results found for: CA2729  No components found for: HGQUANT  No results found for: CEA1 / No results found for: CEA1   No results found for: AFPTUMOR  No results found for: CHROMOGRNA  No results found for: PSA1  Appointment on 12/17/2018  Component Date Value Ref Range Status  . WBC 12/17/2018 6.8  4.0 - 10.5 K/uL Final  . RBC 12/17/2018 3.82* 3.87 - 5.11 MIL/uL Final  . Hemoglobin 12/17/2018 11.2* 12.0 - 15.0 g/dL Final  . HCT 12/17/2018 35.4* 36.0 - 46.0 % Final  . MCV 12/17/2018 92.7  80.0 - 100.0 fL Final  . MCH 12/17/2018 29.3  26.0 - 34.0 pg Final  . MCHC 12/17/2018 31.6  30.0 - 36.0 g/dL Final  . RDW 12/17/2018 13.2  11.5 - 15.5 % Final  . Platelets 12/17/2018 221  150 - 400 K/uL Final  . nRBC 12/17/2018 0.0  0.0 - 0.2 % Final   Performed at [facility], [place] [address] [place], [place] [address]  . Neutrophils Relative % 12/17/2018 PENDING  % Incomplete  . Neutro Abs 12/17/2018 PENDING  1.7 - 7.7 K/uL Incomplete  . Band Neutrophils 12/17/2018 PENDING  % Incomplete  . Lymphocytes Relative 12/17/2018 PENDING  % Incomplete  . Lymphs Abs 12/17/2018 PENDING  0.7 - 4.0 K/uL Incomplete  . Monocytes  Relative 12/17/2018 PENDING  % Incomplete  . Monocytes Absolute 12/17/2018 PENDING  0.1 - 1.0 K/uL Incomplete  . Eosinophils Relative 12/17/2018 PENDING  % Incomplete  . Eosinophils Absolute 12/17/2018 PENDING  0.0 - 0.5 K/uL Incomplete  . Basophils Relative 12/17/2018 PENDING  % Incomplete  . Basophils Absolute 12/17/2018 PENDING  0.0 - 0.1 K/uL Incomplete  . WBC Morphology 12/17/2018 PENDING   Incomplete  . RBC Morphology 12/17/2018 PENDING   Incomplete  . Smear Review 12/17/2018 PENDING   Incomplete  . Other 12/17/2018 PENDING  % Incomplete  . nRBC 12/17/2018 PENDING  0 /100 WBC Incomplete  . Metamyelocytes Relative 12/17/2018 PENDING  % Incomplete  . Myelocytes 12/17/2018 PENDING  % Incomplete  . Promyelocytes Relative 12/17/2018 PENDING  % Incomplete  . Blasts 12/17/2018 PENDING  % Incomplete    (this displays the last labs from the last 3 days)  No results found for: TOTALPROTELP, ALBUMINELP, A1GS, A2GS, BETS, BETA2SER, GAMS, MSPIKE, SPEI (this displays SPEP labs)  No results found for: KPAFRELGTCHN, LAMBDASER, KAPLAMBRATIO (kappa/lambda light chains)  No results found for: HGBA, HGBA2QUANT, HGBFQUANT, HGBSQUAN (Hemoglobinopathy evaluation)   No results found for: LDH  No results found for: IRON, TIBC, IRONPCTSAT (Iron and TIBC)  No results found for: FERRITIN  Urinalysis    Component Value Date/Time   COLORURINE STRAW (A) 12/06/2018 1014   APPEARANCEUR CLEAR 12/06/2018 1014   LABSPEC 1.009 12/06/2018 1014   PHURINE 7.0 12/06/2018 1014   GLUCOSEU NEGATIVE 12/06/2018 1014   HGBUR SMALL (A) 12/06/2018 1014   BILIRUBINUR NEGATIVE 12/06/2018 1014   BILIRUBINUR negative 05/31/2018 1011   KETONESUR NEGATIVE 12/06/2018 1014   PROTEINUR NEGATIVE 12/06/2018 1014   UROBILINOGEN 0.2 05/31/2018 1011   UROBILINOGEN 0.2 05/18/2010 1042   NITRITE NEGATIVE  12/06/2018 1014   LEUKOCYTESUR NEGATIVE 12/06/2018 1014     STUDIES:  Dg Chest 2 View  Result Date:  12/06/2018 CLINICAL DATA:  Fever that began last Night, pt states had first chemo infusion 1 week ago, breast ca EXAM: CHEST - 2 VIEW COMPARISON:  11/14/2018 FINDINGS: RIGHT-sided PowerPort tip overlies the superior vena cava. Heart size is normal. There are no focal consolidations or pleural effusions. No pulmonary edema. No suspicious lytic or blastic lesions are identified. IMPRESSION: No evidence for acute cardiopulmonary abnormality. Electronically Signed   By: Nolon Nations M.D.   On: 12/06/2018 08:51   US Biopsy (liver)  Result Date: 11/25/2018 INDICATION: 48 year old female with a history of right lower outer quadrant breast cancer and evidence of nonspecific low-attenuation lesions within the liver on CT scan of the chest. She presents for ultrasound-guided biopsy to confirm tissue diagnosis of suspected breast cancer metastases to the liver. EXAM: Ultrasound-guided core biopsy, liver lesion MEDICATIONS: None. ANESTHESIA/SEDATION: Moderate (conscious) sedation was employed during this procedure. A total of Versed 4 mg and Fentanyl 100 mcg was administered intravenously. Moderate Sedation Time: 18 minutes. The patient's level of consciousness and vital signs were monitored continuously by radiology nursing throughout the procedure under my direct supervision. FLUOROSCOPY TIME:  None COMPLICATIONS: None immediate. PROCEDURE: Informed written consent was obtained from the patient after a thorough discussion of the procedural risks, benefits and alternatives. All questions were addressed. A timeout was performed prior to the initiation of the procedure. The liver was interrogated with ultrasound. To lesions are identified. The lesions are somewhat indistinct and hyperechoic in appearance. The lesion in hepatic segment 4 measures approximately 1.9 x 1.6 x 2.1 cm. The sonographic appearance is somewhat suggestive of a benign hemangioma. However, given the clinical history of breast cancer, the decision was  made to proceed with biopsy. Ultrasound was used to interrogate the right upper quadrant. The liver was successfully identified. A suitable skin entry site to approach the segment 4 lesion was selected and marked. The region was sterilely prepped and draped in the standard fashion using chlorhexidine skin prep. Local anesthesia was attained by infiltration with 1% lidocaine. A small dermatotomy was made. Under real-time sonographic guidance, a 17 gauge introducer needle was carefully advanced into the margin of the mass. Multiple 18 gauge core biopsies were then coaxially obtained using the bio Pince automated biopsy device. Biopsy specimens were placed in formalin and delivered to pathology for further analysis. As the introducer needle was removed, the biopsy tract was embolized with a Gel-Foam slurry. Post biopsy ultrasound imaging demonstrates no evidence of acute bleeding, perihepatic hematoma or other complication. The patient tolerated the procedure well. IMPRESSION: Technically successful ultrasound-guided core biopsy of hepatic lesion in segment 4. Of note, the imaging appearance is somewhat suggestive of a benign hemangioma. Signed, Criselda Peaches, MD, [facility] Electronically Signed   By: Jacqulynn Cadet M.D.   On: 11/25/2018 15:22    ELIGIBLE FOR AVAILABLE RESEARCH PROTOCOL: Declined   ASSESSMENT: 48 y.o. Brittany Rodgers, Brittany Rodgers woman status post right breast lower outer quadrant biopsy 10/29/2018 for a clinical T2 N0, stage IIB invasive ductal carcinoma, grade 3, triple negative, with an MIB-1 of 80%  (a) CT of the chest 11/15/2018 shows possible liver metastases, biopsy 11/25/2018 consistent with hemangioma  (b) bone scan 11/15/2018 shows no suspicious bone lesions  (1) neoadjuvant chemotherapy will consist of doxorubicin and cyclophosphamide in dose dense fashion x4, starting 11/26/2018 to be followed by weekly  paclitaxel and  carboplatin x12  (2) patient desires ovarian function preservation: Zoladex started 11/08/2018  (3) definitive surgery to follow chemo  (4) adjuvant radiation to follow  (5) genetics testing 11/28/2018   PLAN: Brittany Rodgers is doing moderately well today.  Her CBC is stable.  She is not neutropenic. Her nausea has nearly subsided.  She did have some bone pain and I explained this was likely related to the the [facility].     I recommended the below in her AVS:  "Take your nausea medication when you need it.  For your tiredness, I recommend that you take two five minute walks a day.  For the pain/headache, you can take one extra strength tylenol, and one aleve as much as three times a day if needed.  I will send in magic mouthwash for you.    Constipation, keep taking Miralax daily.  You can add Senokot-S and take it as much as 2 tablets twice a day.  If you go three days without a bowel movement you can take magnesium citrate 1/2 bottle followed by water.  If no bowel movement after 6 hours, you can take the rest of the bottle.  "  She was recommended to continue social distancing during the coronavirus outbreak.  I reviewed the above with her in further detail and gave her copy.  She verbalizes understanding.    She will return in 1 week for labs and f/u for cycle 3 of her treatment. She knows to call for any other issue that may develop before the next visit.  A total of (30) minutes of face-to-face time was spent with this patient with greater than 50% of that time in counseling and care-coordination.   Brittany Bihari, NP  12/17/18 1:39 PM Medical Oncology and Hematology [facility] [address] [place], [place] [address] Tel. [phone]    Fax. [phone]

## 2018-12-17 NOTE — Telephone Encounter (Signed)
Magic Mouthwash called in to [facility] for patient.

## 2018-12-17 NOTE — Patient Instructions (Signed)
Take your nausea medication when you need it.  For your tiredness, I recommend that you take two five minute walks a day.  For the pain/headache, you can take one extra strength tylenol, and one aleve as much as three times a day if needed.  I will send in magic mouthwash for you.    Constipation, keep taking Miralax daily.  You can add Senokot-S and take it as much as 2 tablets twice a day.  If you go three days without a bowel movement you can take magnesium citrate 1/2 bottle followed by water.  If no bowel movement after 6 hours, you can take the rest of the bottle.

## 2018-12-24 ENCOUNTER — Inpatient Hospital Stay: Payer: BLUE CROSS/BLUE SHIELD

## 2018-12-24 ENCOUNTER — Other Ambulatory Visit: Payer: Self-pay

## 2018-12-24 ENCOUNTER — Telehealth: Payer: Self-pay | Admitting: Adult Health

## 2018-12-24 ENCOUNTER — Other Ambulatory Visit: Payer: PRIVATE HEALTH INSURANCE

## 2018-12-24 ENCOUNTER — Encounter: Payer: Self-pay | Admitting: Adult Health

## 2018-12-24 ENCOUNTER — Inpatient Hospital Stay (HOSPITAL_BASED_OUTPATIENT_CLINIC_OR_DEPARTMENT_OTHER): Payer: BLUE CROSS/BLUE SHIELD | Admitting: Adult Health

## 2018-12-24 VITALS — BP 132/78

## 2018-12-24 VITALS — BP 144/102 | HR 90 | Temp 98.7°F | Resp 18 | Ht 62.0 in | Wt 167.8 lb

## 2018-12-24 DIAGNOSIS — R439 Unspecified disturbances of smell and taste: Secondary | ICD-10-CM

## 2018-12-24 DIAGNOSIS — Z171 Estrogen receptor negative status [ER-]: Principal | ICD-10-CM

## 2018-12-24 DIAGNOSIS — K1231 Oral mucositis (ulcerative) due to antineoplastic therapy: Secondary | ICD-10-CM

## 2018-12-24 DIAGNOSIS — Z5111 Encounter for antineoplastic chemotherapy: Secondary | ICD-10-CM | POA: Diagnosis not present

## 2018-12-24 DIAGNOSIS — C50511 Malignant neoplasm of lower-outer quadrant of right female breast: Secondary | ICD-10-CM

## 2018-12-24 DIAGNOSIS — Z95828 Presence of other vascular implants and grafts: Secondary | ICD-10-CM

## 2018-12-24 LAB — COMPREHENSIVE METABOLIC PANEL
ALT: 39 U/L (ref 0–44)
ANION GAP: 11 (ref 5–15)
AST: 14 U/L — ABNORMAL LOW (ref 15–41)
Albumin: 3.7 g/dL (ref 3.5–5.0)
Alkaline Phosphatase: 133 U/L — ABNORMAL HIGH (ref 38–126)
BUN: 14 mg/dL (ref 6–20)
CO2: 27 mmol/L (ref 22–32)
Calcium: 9.1 mg/dL (ref 8.9–10.3)
Chloride: 105 mmol/L (ref 98–111)
Creatinine, Ser: 0.74 mg/dL (ref 0.44–1.00)
GFR calc Af Amer: >60 mL/min (ref >60)
GFR calc non Af Amer: >60 mL/min (ref >60)
Glucose, Bld: 97 mg/dL (ref 70–99)
Potassium: 3.9 mmol/L (ref 3.5–5.1)
Sodium: 143 mmol/L (ref 135–145)
Total Bilirubin: 0.3 mg/dL (ref 0.3–1.2)
Total Protein: 6.8 g/dL (ref 6.5–8.1)

## 2018-12-24 LAB — CBC WITH DIFFERENTIAL/PLATELET
Abs Immature Granulocytes: 1.3 10*3/uL — ABNORMAL HIGH (ref 0.00–0.07)
Band Neutrophils: 21 %
Basophils Absolute: 0 10*3/uL (ref 0.0–0.1)
Basophils Relative: 0 %
Eosinophils Absolute: 0 10*3/uL (ref 0.0–0.5)
Eosinophils Relative: 0 %
HCT: 32.6 % — ABNORMAL LOW (ref 36.0–46.0)
Hemoglobin: 10.3 g/dL — ABNORMAL LOW (ref 12.0–15.0)
LYMPHS ABS: 2.4 10*3/uL (ref 0.7–4.0)
Lymphocytes Relative: 9 %
MCH: 29.3 pg (ref 26.0–34.0)
MCHC: 31.6 g/dL (ref 30.0–36.0)
MCV: 92.6 fL (ref 80.0–100.0)
Metamyelocytes Relative: 4 %
Monocytes Absolute: 1.6 10*3/uL — ABNORMAL HIGH (ref 0.1–1.0)
Monocytes Relative: 6 %
Myelocytes: 1 %
Neutro Abs: 21.2 10*3/uL — ABNORMAL HIGH (ref 1.7–17.7)
Neutrophils Relative %: 59 %
Platelets: 260 10*3/uL (ref 150–400)
RBC: 3.52 MIL/uL — ABNORMAL LOW (ref 3.87–5.11)
RDW: 14.2 % (ref 11.5–15.5)
WBC: 26.5 10*3/uL — ABNORMAL HIGH (ref 4.0–10.5)
nRBC: 0.6 % — ABNORMAL HIGH (ref 0.0–0.2)
nRBC: 1 /100 WBC — ABNORMAL HIGH

## 2018-12-24 MED ORDER — SODIUM CHLORIDE 0.9 % IV SOLN
Freq: Once | INTRAVENOUS | Status: AC
  Administered 2018-12-24: 10:00:00 via INTRAVENOUS
  Filled 2018-12-24: qty 5

## 2018-12-24 MED ORDER — SODIUM CHLORIDE 0.9% FLUSH
10.0000 mL | INTRAVENOUS | Status: DC | PRN
  Administered 2018-12-24: 10 mL
  Filled 2018-12-24: qty 10

## 2018-12-24 MED ORDER — SODIUM CHLORIDE 0.9% FLUSH
10.0000 mL | INTRAVENOUS | Status: DC | PRN
  Administered 2018-12-24: 10 mL via INTRAVENOUS
  Filled 2018-12-24: qty 10

## 2018-12-24 MED ORDER — HEPARIN SOD (PORK) LOCK FLUSH 100 UNIT/ML IV SOLN
500.0000 [IU] | Freq: Once | INTRAVENOUS | Status: AC | PRN
  Administered 2018-12-24: 500 [IU]
  Filled 2018-12-24: qty 5

## 2018-12-24 MED ORDER — PALONOSETRON HCL INJECTION 0.25 MG/5ML
INTRAVENOUS | Status: AC
  Filled 2018-12-24: qty 5

## 2018-12-24 MED ORDER — PALONOSETRON HCL INJECTION 0.25 MG/5ML
0.2500 mg | Freq: Once | INTRAVENOUS | Status: AC
  Administered 2018-12-24: 0.25 mg via INTRAVENOUS

## 2018-12-24 MED ORDER — DOXORUBICIN HCL CHEMO IV INJECTION 2 MG/ML
60.0000 mg/m2 | Freq: Once | INTRAVENOUS | Status: AC
  Administered 2018-12-24: 106 mg via INTRAVENOUS
  Filled 2018-12-24: qty 53

## 2018-12-24 MED ORDER — SODIUM CHLORIDE 0.9 % IV SOLN
600.0000 mg/m2 | Freq: Once | INTRAVENOUS | Status: AC
  Administered 2018-12-24: 1060 mg via INTRAVENOUS
  Filled 2018-12-24: qty 53

## 2018-12-24 MED ORDER — SODIUM CHLORIDE 0.9 % IV SOLN
Freq: Once | INTRAVENOUS | Status: AC
  Administered 2018-12-24: 10:00:00 via INTRAVENOUS
  Filled 2018-12-24: qty 250

## 2018-12-24 NOTE — Progress Notes (Signed)
[place]  [phone] [phone]    ID: Brittany Rodgers DOB: 1971/06/30  MR#: [id number]  LTJ#:[id number]  Patient Care Team: [name], MD as PCP - General Rolm Bookbinder, MD as Consulting Physician (General Surgery) Magrinat, [name], MD as Consulting Physician (Oncology) [name], MD as Attending Physician (Radiation Oncology) [name], MD as Consulting Physician (Gastroenterology) [name], MD as Consulting Physician (Obstetrics and Gynecology) OTHER MD: Dr. Siri Cole (urology)    CHIEF COMPLAINT: Triple negative breast cancer  CURRENT TREATMENT: Neoadjuvant chemotherapy, goserelin  HISTORY OF CURRENT ILLNESS: From the original intake note:  Brittany Rodgers presented with a palpable right breast lump and associated tenderness for approximately 1 week. She called her doctor immediately following  She underwent unilateral right diagnostic mammography with tomography and right breast ultrasonography at [facility] on 10/28/2018 showing: Breast Density Category C. Radiopaque BB was placed at the site of the patient's palpable lump in the central right breast. An irregular hyperdense mass with associated increased trabecular thickening is seen in the deep central right breast. On physical exam, a firm, fixed mass in the central right breast is palpated. Target ultrasound is performed, showing and irregular hypoechoic mass at the 8 o'clock position 3 cm from the nipple. It measures 3.6 x 2.9 x 2.3 cm. There is associated peripheral vascularity. Evaluation of the right axilla demonstrates a single prominent lymph node demonstrating 0.4 cm cortical thickening. The remaining lymph nodes ave normal appearance with a thin, symmetric cortex.   Accordingly on 10/29/2018 she proceeded to biopsy of the right breast area in question. The pathology from this procedure showed ([id number]): invasive ductal carcinoma, grade III. Prognostic  indicators significant for: estrogen receptor, 0% negative and progesterone receptor, 0% negative. Proliferation marker Ki67 at 80%. HER2 negative (1+) by immunohistochemistry.  Biopsy of the right axilla was performed on the same day. The pathology from this procedure showed ([id number]): lymph node, negative for carcinoma.  This was felt to be concordant  The patient's subsequent history is as detailed below.   INTERVAL HISTORY: Brittany Rodgers returns today for follow-up and treatment of her triple negative breast cancer.  She continues on neoadjuvant chemotherapy receiving Doxorubicin and Cyclophosphamide given on day 1 of a 14 day cycle, with udenyca support on day 3. Today is day 1 cycle 3. Brittany Rodgers is doing moderately well today.    REVIEW OF SYSTEMS: Brittany Rodgers is doing moderately well today.  She continues to have some taste changes.  She was unable to pick up magic mouthwash for her mouth soreness.  She says that it wasn't covered under her insurance.  She wants to know if the magic mouthwash will cure her mouth pain, or if it is something she will have to take continually.    Brittany denies any fever or chills.  She is without cough, shortness of breath, chest pain, palpitations.  She hasn't noted nausea, vomiting, bowel/bladder changes.  She is fatigued.  A detailed ROS was otherwise non contributory.     PAST MEDICAL HISTORY: Past Medical History:  Diagnosis Date  . Chronic cough   . Family history of breast cancer   . GERD (gastroesophageal reflux disease)   . Hyperlipidemia   . IBS (irritable bowel syndrome)    sees Dr. Silvano Rusk   . Routine gynecological examination    sees Dr. Freda Munro     PAST SURGICAL HISTORY: Past Surgical History:  Procedure Laterality Date  . CESAREAN SECTION    .  PORTACATH PLACEMENT Right 11/14/2018   Procedure: INSERTION PORT-A-CATH WITH ULTRASOUND;  Surgeon: Rolm Bookbinder, MD;  Location: [facility];  Service: General;   Laterality: Right;  . ROOT CANAL       FAMILY HISTORY: Family History  Problem Relation Age of Onset  . Hypertension Other   . Breast cancer Other        mat first cousin's daughter  . Breast cancer Maternal Aunt   . Hypertension Mother   . Breast cancer Maternal Aunt   . Breast cancer Maternal Aunt    Brittany's father died from an unknown cause at age 64. [name] died from unknown health complications at age 59. The patient has 4 sisters. Brittany Rodgers has 3 maternal aunts that have been diagnosed with breast cancer. Patient denies anyone in her family having ovarian, prostate, or pancreatic cancer.    GYNECOLOGIC HISTORY:  No LMP recorded. 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 Her husband, Heidi Maclin, is a Administrator. The patient has two children, Brittany Rodgers and Brittany Rodgers, from another relationship. Brittany Rodgers is 46, lives in [place]. Brittany Rodgers is 68, lives in [place], and is in [employer].  The patient's husband [name] has a child, Brittany Rodgers, from another relationship; she lives in [place] and is in school. The patient has 5 grandchildren. She attends the [employer].   ADVANCED DIRECTIVES: Brittany's husband, [name], is automatically her healthcare power of attorney.     HEALTH MAINTENANCE: Social History   Tobacco Use  . Smoking status: Never Smoker  . Smokeless tobacco: Never Used  Substance Use Topics  . Alcohol use: No    Alcohol/week: 0.0 standard drinks  . Drug use: No    Colonoscopy: yes  PAP: 02/2018  Bone density: no   Allergies  Allergen Reactions  . Barbiturates Other (See Comments)    Keeps pt awake  . Sulfonamide Derivatives Itching    Current Outpatient Medications  Medication Sig Dispense Refill  . atorvastatin (LIPITOR) 20 MG tablet Take 1 tablet (20 mg total) by mouth daily.  90 tablet 3  . cyclobenzaprine (FLEXERIL) 10 MG tablet TAKE 1 TABLET BY MOUTH THREE TIMES DAILY AS NEEDED FOR MUSCLE SPASMS 90 tablet 0  . dexamethasone (DECADRON) 4 MG tablet Take 2 tablets by mouth once a day on the day after chemotherapy and then take 2 tablets two times a day for 2 days. Take with food. 30 tablet 1  . lidocaine-prilocaine (EMLA) cream Apply to affected area once 30 g 3  . loratadine (CLARITIN) 10 MG tablet Take 1 tablet (10 mg total) by mouth daily. 60 tablet 0  . LORazepam (ATIVAN) 0.5 MG tablet Take 1 tablet (0.5 mg total) by mouth at bedtime as needed (Nausea or vomiting). 30 tablet 0  . Omeprazole-Sodium Bicarbonate (ZEGERID) 20-1100 MG CAPS capsule Take 1 capsule by mouth daily before breakfast. 90 each 3  . polyethylene glycol (MIRALAX / GLYCOLAX) packet Take 17 g by mouth as needed. (Patient taking differently: Take 17 g by mouth daily as needed (constipation). Mix in 8 oz liquid and drink) 100 each 5  . potassium chloride (K-DUR) 10 MEQ tablet TAKE 1 TABLET BY MOUTH ONCE DAILY 90 tablet 3  . prochlorperazine (COMPAZINE) 10 MG tablet Take 1 tablet (10 mg total) by mouth every 6 (six) hours as needed (Nausea or  vomiting). 30 tablet 1  . traMADol (ULTRAM) 50 MG tablet Take 1-2 tablets (50-100 mg total) by mouth every 6 (six) hours as needed for moderate pain. 20 tablet 0   Current Facility-Administered Medications  Medication Dose Route Frequency Provider Last Rate Last Dose  . sodium chloride flush (NS) 0.9 % injection 10 mL  10 mL Intravenous PRN Magrinat, [name], MD   10 mL at 12/24/18 0848   Facility-Administered Medications Ordered in Other Visits  Medication Dose Route Frequency Provider Last Rate Last Dose  . cyclophosphamide (CYTOXAN) 1,060 mg in sodium chloride 0.9 % 250 mL chemo infusion  600 mg/m2 (Treatment Plan Recorded) Intravenous Once Magrinat, [name], MD      . DOXOrubicin (ADRIAMYCIN) chemo injection 106 mg  60 mg/m2 (Treatment Plan Recorded)  Intravenous Once Magrinat, [name], MD      . heparin lock flush 100 unit/mL  500 Units Intracatheter Once PRN Magrinat, [name], MD      . sodium chloride flush (NS) 0.9 % injection 10 mL  10 mL Intracatheter PRN Magrinat, [name], MD         OBJECTIVE:   Vitals:   12/24/18 0834  BP: (!) 144/102  Pulse: 90  Resp: 18  Temp: 98.7 F (37.1 C)  SpO2: 100%     Body mass index is 30.69 kg/m.   Wt Readings from Last 3 Encounters:  12/24/18 167 lb 12.8 oz (76.1 kg)  12/17/18 165 lb (74.8 kg)  12/10/18 163 lb 6.4 oz (74.1 kg)   ECOG FS:1 - Symptomatic but completely ambulatory GENERAL: Patient is a well appearing female in no acute distress HEENT:  Sclerae anicteric.  Oropharynx clear and moist. No ulcerations or evidence of oropharyngeal candidiasis. Neck is supple.  NODES:  No cervical, supraclavicular, or axillary lymphadenopathy palpated.  BREAST EXAM:  Deferred. LUNGS:  Clear to auscultation bilaterally.  No wheezes or rhonchi. HEART:  Regular rate and rhythm. No murmur appreciated. ABDOMEN:  Soft, nontender.  Positive, normoactive bowel sounds. No organomegaly palpated. MSK:  No focal spinal tenderness to palpation. Full range of motion bilaterally in the upper extremities. EXTREMITIES:  No peripheral edema.   SKIN:  Clear with no obvious rashes or skin changes. No nail dyscrasia. NEURO:  Nonfocal. Well oriented.  Appropriate affect.        LAB RESULTS:  CMP     Component Value Date/Time   NA 143 12/24/2018 0830   K 3.9 12/24/2018 0830   CL 105 12/24/2018 0830   CO2 27 12/24/2018 0830   GLUCOSE 97 12/24/2018 0830   BUN 14 12/24/2018 0830   CREATININE 0.74 12/24/2018 0830   CREATININE 0.86 11/06/2018 0821   CALCIUM 9.1 12/24/2018 0830   PROT 6.8 12/24/2018 0830   ALBUMIN 3.7 12/24/2018 0830   AST 14 (L) 12/24/2018 0830   AST 11 (L) 11/06/2018 0821   ALT 39 12/24/2018 0830   ALT 25 11/06/2018 0821   ALKPHOS 133 (H) 12/24/2018 0830   BILITOT 0.3 12/24/2018  0830   BILITOT 0.5 11/06/2018 0821   GFRNONAA >60 12/24/2018 0830   GFRNONAA >60 11/06/2018 0821   GFRAA >60 12/24/2018 0830   GFRAA >60 11/06/2018 0821    No results found for: TOTALPROTELP, ALBUMINELP, A1GS, A2GS, BETS, BETA2SER, GAMS, MSPIKE, SPEI  No results found for: KPAFRELGTCHN, LAMBDASER, KAPLAMBRATIO  Lab Results  Component Value Date   WBC 26.5 (H) 12/24/2018   NEUTROABS 21.2 (H) 12/24/2018   HGB 10.3 (L) 12/24/2018   HCT  32.6 (L) 12/24/2018   MCV 92.6 12/24/2018   PLT 260 12/24/2018    _0 @  No results found for: LABCA2  No components found for: [id number]  No results for input(s): INR in the last 168 hours.  No results found for: LABCA2  No results found for: NWG956  No results found for: OZH086  No results found for: VHQ469  No results found for: CA2729  No components found for: HGQUANT  No results found for: CEA1 / No results found for: CEA1   No results found for: AFPTUMOR  No results found for: CHROMOGRNA  No results found for: PSA1  Appointment on 12/24/2018  Component Date Value Ref Range Status  . WBC 12/24/2018 26.5* 4.0 - 10.5 K/uL Final  . RBC 12/24/2018 3.52* 3.87 - 5.11 MIL/uL Final  . Hemoglobin 12/24/2018 10.3* 12.0 - 15.0 g/dL Final  . HCT 12/24/2018 32.6* 36.0 - 46.0 % Final  . MCV 12/24/2018 92.6  80.0 - 100.0 fL Final  . MCH 12/24/2018 29.3  26.0 - 34.0 pg Final  . MCHC 12/24/2018 31.6  30.0 - 36.0 g/dL Final  . RDW 12/24/2018 14.2  11.5 - 15.5 % Final  . Platelets 12/24/2018 260  150 - 400 K/uL Final  . nRBC 12/24/2018 0.6* 0.0 - 0.2 % Final  . Neutrophils Relative % 12/24/2018 59  % Final  . Neutro Abs 12/24/2018 21.2* 1.7 - 17.7 K/uL Final  . Band Neutrophils 12/24/2018 21  % Final  . Lymphocytes Relative 12/24/2018 9  % Final  . Lymphs Abs 12/24/2018 2.4  0.7 - 4.0 K/uL Final  . Monocytes Relative 12/24/2018 6  % Final  . Monocytes Absolute 12/24/2018 1.6* 0.1 - 1.0 K/uL Final  . Eosinophils Relative  12/24/2018 0  % Final  . Eosinophils Absolute 12/24/2018 0.0  0.0 - 0.5 K/uL Final  . Basophils Relative 12/24/2018 0  % Final  . Basophils Absolute 12/24/2018 0.0  0.0 - 0.1 K/uL Final  . nRBC 12/24/2018 1* 0 /100 WBC Final  . Metamyelocytes Relative 12/24/2018 4  % Final  . Myelocytes 12/24/2018 1  % Final  . Abs Immature Granulocytes 12/24/2018 1.30* 0.00 - 0.07 K/uL Final  . Tear Drop Cells 12/24/2018 PRESENT   Final  . Polychromasia 12/24/2018 PRESENT   Final  . Ovalocytes 12/24/2018 PRESENT   Final   Performed at [facility], [place] [address], [place], [place] [address]  . Sodium 12/24/2018 143  135 - 145 mmol/L Final  . Potassium 12/24/2018 3.9  3.5 - 5.1 mmol/L Final  . Chloride 12/24/2018 105  98 - 111 mmol/L Final  . CO2 12/24/2018 27  22 - 32 mmol/L Final  . Glucose, Bld 12/24/2018 97  70 - 99 mg/dL Final  . BUN 12/24/2018 14  6 - 20 mg/dL Final  . Creatinine, Ser 12/24/2018 0.74  0.44 - 1.00 mg/dL Final  . Calcium 12/24/2018 9.1  8.9 - 10.3 mg/dL Final  . Total Protein 12/24/2018 6.8  6.5 - 8.1 g/dL Final  . Albumin 12/24/2018 3.7  3.5 - 5.0 g/dL Final  . AST 12/24/2018 14* 15 - 41 U/L Final  . ALT 12/24/2018 39  0 - 44 U/L Final  . Alkaline Phosphatase 12/24/2018 133* 38 - 126 U/L Final  . Total Bilirubin 12/24/2018 0.3  0.3 - 1.2 mg/dL Final  . GFR calc non Af Amer 12/24/2018 >60  >60 mL/min Final  . GFR calc Af Amer 12/24/2018 >60  >60 mL/min Final  .  Anion gap 12/24/2018 11  5 - 15 Final   Performed at [facility], [address] [place], [place] [address]    (this displays the last labs from the last 3 days)  No results found for: TOTALPROTELP, ALBUMINELP, A1GS, A2GS, BETS, BETA2SER, GAMS, MSPIKE, SPEI (this displays SPEP labs)  No results found for: KPAFRELGTCHN, LAMBDASER, KAPLAMBRATIO (kappa/lambda light chains)  No results found for: HGBA, HGBA2QUANT, HGBFQUANT, HGBSQUAN (Hemoglobinopathy evaluation)    No results found for: LDH  No results found for: IRON, TIBC, IRONPCTSAT (Iron and TIBC)  No results found for: FERRITIN  Urinalysis    Component Value Date/Time   COLORURINE STRAW (A) 12/06/2018 1014   APPEARANCEUR CLEAR 12/06/2018 1014   LABSPEC 1.009 12/06/2018 1014   PHURINE 7.0 12/06/2018 1014   GLUCOSEU NEGATIVE 12/06/2018 1014   HGBUR SMALL (A) 12/06/2018 1014   BILIRUBINUR NEGATIVE 12/06/2018 1014   BILIRUBINUR negative 05/31/2018 1011   KETONESUR NEGATIVE 12/06/2018 1014   PROTEINUR NEGATIVE 12/06/2018 1014   UROBILINOGEN 0.2 05/31/2018 1011   UROBILINOGEN 0.2 05/18/2010 1042   NITRITE NEGATIVE 12/06/2018 1014   LEUKOCYTESUR NEGATIVE 12/06/2018 1014     STUDIES:  Dg Chest 2 View  Result Date: 12/06/2018 CLINICAL DATA:  Fever that began last Night, pt states had first chemo infusion 1 week ago, breast ca EXAM: CHEST - 2 VIEW COMPARISON:  11/14/2018 FINDINGS: RIGHT-sided PowerPort tip overlies the superior vena cava. Heart size is normal. There are no focal consolidations or pleural effusions. No pulmonary edema. No suspicious lytic or blastic lesions are identified. IMPRESSION: No evidence for acute cardiopulmonary abnormality. Electronically Signed   By: Nolon Nations M.D.   On: 12/06/2018 08:51   US Biopsy (liver)  Result Date: 11/25/2018 INDICATION: 48 year old female with a history of right lower outer quadrant breast cancer and evidence of nonspecific low-attenuation lesions within the liver on CT scan of the chest. She presents for ultrasound-guided biopsy to confirm tissue diagnosis of suspected breast cancer metastases to the liver. EXAM: Ultrasound-guided core biopsy, liver lesion MEDICATIONS: None. ANESTHESIA/SEDATION: Moderate (conscious) sedation was employed during this procedure. A total of Versed 4 mg and Fentanyl 100 mcg was administered intravenously. Moderate Sedation Time: 18 minutes. The patient's level of consciousness and vital signs were monitored  continuously by radiology nursing throughout the procedure under my direct supervision. FLUOROSCOPY TIME:  None COMPLICATIONS: None immediate. PROCEDURE: Informed written consent was obtained from the patient after a thorough discussion of the procedural risks, benefits and alternatives. All questions were addressed. A timeout was performed prior to the initiation of the procedure. The liver was interrogated with ultrasound. To lesions are identified. The lesions are somewhat indistinct and hyperechoic in appearance. The lesion in hepatic segment 4 measures approximately 1.9 x 1.6 x 2.1 cm. The sonographic appearance is somewhat suggestive of a benign hemangioma. However, given the clinical history of breast cancer, the decision was made to proceed with biopsy. Ultrasound was used to interrogate the right upper quadrant. The liver was successfully identified. A suitable skin entry site to approach the segment 4 lesion was selected and marked. The region was sterilely prepped and draped in the standard fashion using chlorhexidine skin prep. Local anesthesia was attained by infiltration with 1% lidocaine. A small dermatotomy was made. Under real-time sonographic guidance, a 17 gauge introducer needle was carefully advanced into the margin of the mass. Multiple 18 gauge core biopsies were then coaxially obtained using the bio Pince automated biopsy device. Biopsy specimens were placed in  formalin and delivered to pathology for further analysis. As the introducer needle was removed, the biopsy tract was embolized with a Gel-Foam slurry. Post biopsy ultrasound imaging demonstrates no evidence of acute bleeding, perihepatic hematoma or other complication. The patient tolerated the procedure well. IMPRESSION: Technically successful ultrasound-guided core biopsy of hepatic lesion in segment 4. Of note, the imaging appearance is somewhat suggestive of a benign hemangioma. Signed, Criselda Peaches, MD, [facility] Electronically Signed   By: Jacqulynn Cadet M.D.   On: 11/25/2018 15:22    ELIGIBLE FOR AVAILABLE RESEARCH PROTOCOL: Declined   ASSESSMENT: 48 y.o. Fernand Parkins, [place] woman status post right breast lower outer quadrant biopsy 10/29/2018 for a clinical T2 N0, stage IIB invasive ductal carcinoma, grade 3, triple negative, with an MIB-1 of 80%  (a) CT of the chest 11/15/2018 shows possible liver metastases, biopsy 11/25/2018 consistent with hemangioma  (b) bone scan 11/15/2018 shows no suspicious bone lesions  (1) neoadjuvant chemotherapy will consist of doxorubicin and cyclophosphamide in dose dense fashion x4, starting 11/26/2018 to be followed by weekly paclitaxel and carboplatin x12  (2) patient desires ovarian function preservation: Zoladex started 11/08/2018  (3) definitive surgery to follow chemo  (4) adjuvant radiation to follow  (5) genetics counseling on 11/28/2018, testing was recommended, but declined by patient.   PLAN: Brittany Rodgers is doing moderately well today.  Her CBC is stable and she will proceed with her third cycle of neoadjuvant chemotherapy with Doxorubicin and Cyclophosphamide today (so long as pending CMET is within parameters).  We reviewed her taste changes and h/o mucositis.  I explained that this is a common side effect of chemotherapy.  I reviewed how the chemotherapy works, and how that sometimes will cause issues in other areas of the body that we will help support her through.  I asked my nurse to call [place] to follow up on the magic mouthwash insurance issue.  I have not come across this before.  Also, I reviewed foods/spices that may help her food be more palatable.  Brittany and I reviewed social distancing and isolations due to the current COVID-19 pandemic.  I reviewed that since she is undergoing chemotherapy she is at increased risk for complications should she get the virus.  She has been  following all of these recommendations.    Brittany Rodgers will return on Thursday, 12/26/2018 for Udenyca, and in one week for labs and f/u to evaluate how she has tolerated her treatment.  She knows to call for any other issue that may develop before the next visit.  A total of (20) minutes of face-to-face time was spent with this patient with greater than 50% of that time in counseling and care-coordination.   Wilber Bihari, NP  12/24/18 10:44 AM Medical Oncology and Hematology [facility] [address] [place], [place] [address] Tel. [phone]    Fax. [phone]

## 2018-12-24 NOTE — Patient Instructions (Signed)
Coronavirus (COVID-19) Are you at risk?  Are you at risk for the Coronavirus (COVID-19)?  To be considered HIGH RISK for Coronavirus (COVID-19), you have to meet the following criteria:  . Traveled to [place], [place], [name], [place] or [place]; or in the [place] to [place], [place], [place], or [place]; and have fever, cough, and shortness of breath within the last 2 weeks of travel OR . Been in close contact with a person diagnosed with COVID-19 within the last 2 weeks and have fever, cough, and shortness of breath . IF YOU DO NOT MEET THESE CRITERIA, YOU ARE CONSIDERED LOW RISK FOR COVID-19.  What to do if you are HIGH RISK for COVID-19?  . If you are having a medical emergency, call 911. . Seek medical care right away. Before you go to a doctor's office, urgent care or emergency department, call ahead and tell them about your recent travel, contact with someone diagnosed with COVID-19, and your symptoms. You should receive instructions from your physician's office regarding next steps of care.  . When you arrive at healthcare provider, tell the healthcare staff immediately you have returned from visiting [place], [place], [place], [place] or [name]; or traveled in the [place] to [place], [place], [place], or [place]; in the last two weeks or you have been in close contact with a person diagnosed with COVID-19 in the last 2 weeks.   . Tell the health care staff about your symptoms: fever, cough and shortness of breath. . After you have been seen by a medical provider, you will be either: o Tested for (COVID-19) and discharged home on quarantine except to seek medical care if symptoms worsen, and asked to  - Stay home and avoid contact with others until you get your results (4-5 days)  - Avoid travel on public transportation if possible (such as bus, train, or airplane) or o Sent to the Emergency Department by EMS for evaluation, COVID-19 testing, and possible  admission depending on your condition and test results.  What to do if you are LOW RISK for COVID-19?  Reduce your risk of any infection by using the same precautions used for avoiding the common cold or flu:  . Wash your hands often with soap and warm water for at least 20 seconds.  If soap and water are not readily available, use an alcohol-based hand sanitizer with at least 60% alcohol.  . If coughing or sneezing, cover your mouth and nose by coughing or sneezing into the elbow areas of your shirt or coat, into a tissue or into your sleeve (not your hands). . Avoid shaking hands with others and consider head nods or verbal greetings only. . Avoid touching your eyes, nose, or mouth with unwashed hands.  . Avoid close contact with people who are sick. . Avoid places or events with large numbers of people in one location, like concerts or sporting events. . Carefully consider travel plans you have or are making. . If you are planning any travel outside or inside the [place], visit the CDC's Travelers' Health webpage for the latest health notices. . If you have some symptoms but not all symptoms, continue to monitor at home and seek medical attention if your symptoms worsen. . If you are having a medical emergency, call 911.   ADDITIONAL HEALTHCARE OPTIONS FOR PATIENTS  [facility] / e-Visit: [email or web address]         [facility]: [phone]  Onset   Urgent Care: [facility]: [facility] Discharge Instructions for Patients Receiving Chemotherapy  Today you received the following chemotherapy agents: doxorubicin (Adriamycin) and cyclophosphamide (Cytoxan)   To help prevent nausea and vomiting after your treatment, we encourage you to take your nausea medication as prescribed by your physician.   If you develop nausea and vomiting that is not  controlled by your nausea medication, call the clinic.   BELOW ARE SYMPTOMS THAT SHOULD BE REPORTED IMMEDIATELY:  *FEVER GREATER THAN 100.5 F  *CHILLS WITH OR WITHOUT FEVER  NAUSEA AND VOMITING THAT IS NOT CONTROLLED WITH YOUR NAUSEA MEDICATION  *UNUSUAL SHORTNESS OF BREATH  *UNUSUAL BRUISING OR BLEEDING  TENDERNESS IN MOUTH AND THROAT WITH OR WITHOUT PRESENCE OF ULCERS  *URINARY PROBLEMS  *BOWEL PROBLEMS  UNUSUAL RASH Items with * indicate a potential emergency and should be followed up as soon as possible.  Feel free to call the clinic should you have any questions or concerns. The clinic phone number is [phone].  Please show the [facility] at check-in to the Emergency Department and triage nurse.

## 2018-12-24 NOTE — Telephone Encounter (Signed)
No 3/31 los °

## 2018-12-24 NOTE — Patient Instructions (Signed)

## 2018-12-26 ENCOUNTER — Inpatient Hospital Stay: Payer: BLUE CROSS/BLUE SHIELD | Attending: Adult Health

## 2018-12-26 ENCOUNTER — Other Ambulatory Visit: Payer: Self-pay

## 2018-12-26 DIAGNOSIS — N951 Menopausal and female climacteric states: Secondary | ICD-10-CM | POA: Diagnosis not present

## 2018-12-26 DIAGNOSIS — K1230 Oral mucositis (ulcerative), unspecified: Secondary | ICD-10-CM | POA: Insufficient documentation

## 2018-12-26 DIAGNOSIS — Z171 Estrogen receptor negative status [ER-]: Secondary | ICD-10-CM

## 2018-12-26 DIAGNOSIS — Z5189 Encounter for other specified aftercare: Secondary | ICD-10-CM | POA: Insufficient documentation

## 2018-12-26 DIAGNOSIS — R5383 Other fatigue: Secondary | ICD-10-CM | POA: Diagnosis not present

## 2018-12-26 DIAGNOSIS — Z5111 Encounter for antineoplastic chemotherapy: Secondary | ICD-10-CM | POA: Diagnosis not present

## 2018-12-26 DIAGNOSIS — C50511 Malignant neoplasm of lower-outer quadrant of right female breast: Secondary | ICD-10-CM | POA: Diagnosis present

## 2018-12-26 MED ORDER — GOSERELIN ACETATE 3.6 MG ~~LOC~~ IMPL
DRUG_IMPLANT | SUBCUTANEOUS | Status: AC
  Filled 2018-12-26: qty 3.6

## 2018-12-26 MED ORDER — PEGFILGRASTIM-CBQV 6 MG/0.6ML ~~LOC~~ SOSY
PREFILLED_SYRINGE | SUBCUTANEOUS | Status: AC
  Filled 2018-12-26: qty 0.6

## 2018-12-26 MED ORDER — GOSERELIN ACETATE 3.6 MG ~~LOC~~ IMPL: 3.6000 mg | DRUG_IMPLANT | Freq: Once | SUBCUTANEOUS | Status: DC

## 2018-12-26 MED ORDER — PEGFILGRASTIM-CBQV 6 MG/0.6ML ~~LOC~~ SOSY
6.0000 mg | PREFILLED_SYRINGE | Freq: Once | SUBCUTANEOUS | Status: AC
  Administered 2018-12-26: 13:00:00 6 mg via SUBCUTANEOUS

## 2018-12-26 NOTE — Patient Instructions (Signed)
Pegfilgrastim injection  What is this medicine?  PEGFILGRASTIM (PEG fil gra stim) is a long-acting granulocyte colony-stimulating factor that stimulates the growth of neutrophils, a type of white blood cell important in the body's fight against infection. It is used to reduce the incidence of fever and infection in patients with certain types of cancer who are receiving chemotherapy that affects the bone marrow, and to increase survival after being exposed to high doses of radiation.  This medicine may be used for other purposes; ask your health care provider or pharmacist if you have questions.  COMMON BRAND NAME(S): Fulphila, Neulasta, UDENYCA  What should I tell my health care provider before I take this medicine?  They need to know if you have any of these conditions:  -kidney disease  -latex allergy  -ongoing radiation therapy  -sickle cell disease  -skin reactions to acrylic adhesives (On-Body Injector only)  -an unusual or allergic reaction to pegfilgrastim, filgrastim, other medicines, foods, dyes, or preservatives  -pregnant or trying to get pregnant  -breast-feeding  How should I use this medicine?  This medicine is for injection under the skin. If you get this medicine at home, you will be taught how to prepare and give the pre-filled syringe or how to use the On-body Injector. Refer to the patient Instructions for Use for detailed instructions. Use exactly as directed. Tell your healthcare provider immediately if you suspect that the On-body Injector may not have performed as intended or if you suspect the use of the On-body Injector resulted in a missed or partial dose.  It is important that you put your used needles and syringes in a special sharps container. Do not put them in a trash can. If you do not have a sharps container, call your pharmacist or healthcare provider to get one.  Talk to your pediatrician regarding the use of this medicine in children. While this drug may be prescribed for  selected conditions, precautions do apply.  Overdosage: If you think you have taken too much of this medicine contact a poison control center or emergency room at once.  NOTE: This medicine is only for you. Do not share this medicine with others.  What if I miss a dose?  It is important not to miss your dose. Call your doctor or health care professional if you miss your dose. If you miss a dose due to an On-body Injector failure or leakage, a new dose should be administered as soon as possible using a single prefilled syringe for manual use.  What may interact with this medicine?  Interactions have not been studied.  Give your health care provider a list of all the medicines, herbs, non-prescription drugs, or dietary supplements you use. Also tell them if you smoke, drink alcohol, or use illegal drugs. Some items may interact with your medicine.  This list may not describe all possible interactions. Give your health care provider a list of all the medicines, herbs, non-prescription drugs, or dietary supplements you use. Also tell them if you smoke, drink alcohol, or use illegal drugs. Some items may interact with your medicine.  What should I watch for while using this medicine?  You may need blood work done while you are taking this medicine.  If you are going to need a MRI, CT scan, or other procedure, tell your doctor that you are using this medicine (On-Body Injector only).  What side effects may I notice from receiving this medicine?  Side effects that you should report to   your doctor or health care professional as soon as possible:  -allergic reactions like skin rash, itching or hives, swelling of the face, lips, or tongue  -back pain  -dizziness  -fever  -pain, redness, or irritation at site where injected  -pinpoint red spots on the skin  -red or dark-brown urine  -shortness of breath or breathing problems  -stomach or side pain, or pain at the shoulder  -swelling  -tiredness  -trouble passing urine or  change in the amount of urine  Side effects that usually do not require medical attention (report to your doctor or health care professional if they continue or are bothersome):  -bone pain  -muscle pain  This list may not describe all possible side effects. Call your doctor for medical advice about side effects. You may report side effects to FDA at 1-800-FDA-1088.  Where should I keep my medicine?  Keep out of the reach of children.  If you are using this medicine at home, you will be instructed on how to store it. Throw away any unused medicine after the expiration date on the label.  NOTE: This sheet is a summary. It may not cover all possible information. If you have questions about this medicine, talk to your doctor, pharmacist, or health care provider.   2019 [employer] Standard (2017-12-17 16:57:08)

## 2018-12-31 ENCOUNTER — Encounter: Payer: Self-pay | Admitting: Adult Health

## 2018-12-31 ENCOUNTER — Inpatient Hospital Stay: Payer: BLUE CROSS/BLUE SHIELD

## 2018-12-31 ENCOUNTER — Inpatient Hospital Stay (HOSPITAL_BASED_OUTPATIENT_CLINIC_OR_DEPARTMENT_OTHER): Payer: BLUE CROSS/BLUE SHIELD | Admitting: Adult Health

## 2018-12-31 ENCOUNTER — Other Ambulatory Visit: Payer: Self-pay

## 2018-12-31 VITALS — BP 123/62 | HR 105 | Temp 99.2°F | Resp 18 | Ht 62.0 in | Wt 167.6 lb

## 2018-12-31 DIAGNOSIS — K1230 Oral mucositis (ulcerative), unspecified: Secondary | ICD-10-CM

## 2018-12-31 DIAGNOSIS — Z171 Estrogen receptor negative status [ER-]: Secondary | ICD-10-CM | POA: Diagnosis not present

## 2018-12-31 DIAGNOSIS — Z5111 Encounter for antineoplastic chemotherapy: Secondary | ICD-10-CM | POA: Diagnosis not present

## 2018-12-31 DIAGNOSIS — N951 Menopausal and female climacteric states: Secondary | ICD-10-CM

## 2018-12-31 DIAGNOSIS — R5383 Other fatigue: Secondary | ICD-10-CM

## 2018-12-31 DIAGNOSIS — C50511 Malignant neoplasm of lower-outer quadrant of right female breast: Secondary | ICD-10-CM

## 2018-12-31 LAB — CBC WITH DIFFERENTIAL/PLATELET
Abs Immature Granulocytes: 1.24 10*3/uL — ABNORMAL HIGH (ref 0.00–0.07)
Basophils Absolute: 0.1 10*3/uL (ref 0.0–0.1)
Basophils Relative: 1 %
Eosinophils Absolute: 0 10*3/uL (ref 0.0–0.5)
Eosinophils Relative: 0 %
HCT: 31.4 % — ABNORMAL LOW (ref 36.0–46.0)
Hemoglobin: 10 g/dL — ABNORMAL LOW (ref 12.0–15.0)
Immature Granulocytes: 6 %
Lymphocytes Relative: 5 %
Lymphs Abs: 1.1 10*3/uL (ref 0.7–4.0)
MCH: 29.7 pg (ref 26.0–34.0)
MCHC: 31.8 g/dL (ref 30.0–36.0)
MCV: 93.2 fL (ref 80.0–100.0)
Monocytes Absolute: 1.6 10*3/uL — ABNORMAL HIGH (ref 0.1–1.0)
Monocytes Relative: 8 %
Neutro Abs: 16.3 10*3/uL — ABNORMAL HIGH (ref 1.7–7.7)
Neutrophils Relative %: 80 %
Platelets: 274 10*3/uL (ref 150–400)
RBC: 3.37 MIL/uL — ABNORMAL LOW (ref 3.87–5.11)
RDW: 14.7 % (ref 11.5–15.5)
WBC: 20.4 10*3/uL — ABNORMAL HIGH (ref 4.0–10.5)
nRBC: 0 % (ref 0.0–0.2)

## 2018-12-31 LAB — COMPREHENSIVE METABOLIC PANEL
ALT: 17 U/L (ref 0–44)
AST: <6 U/L — ABNORMAL LOW (ref 15–41)
Albumin: 4 g/dL (ref 3.5–5.0)
Alkaline Phosphatase: 161 U/L — ABNORMAL HIGH (ref 38–126)
Anion gap: 13 (ref 5–15)
BUN: 26 mg/dL — ABNORMAL HIGH (ref 6–20)
CO2: 26 mmol/L (ref 22–32)
Calcium: 9.6 mg/dL (ref 8.9–10.3)
Chloride: 102 mmol/L (ref 98–111)
Creatinine, Ser: 0.85 mg/dL (ref 0.44–1.00)
GFR calc Af Amer: >60 mL/min (ref >60)
GFR calc non Af Amer: >60 mL/min (ref >60)
Glucose, Bld: 114 mg/dL — ABNORMAL HIGH (ref 70–99)
Potassium: 3.9 mmol/L (ref 3.5–5.1)
Sodium: 141 mmol/L (ref 135–145)
Total Bilirubin: 0.4 mg/dL (ref 0.3–1.2)
Total Protein: 7.1 g/dL (ref 6.5–8.1)

## 2018-12-31 MED ORDER — MAGIC MOUTHWASH: 5.0000 mL | Freq: Three times a day (TID) | ORAL | 0 refills | Status: DC | PRN

## 2018-12-31 MED ORDER — VALACYCLOVIR HCL 500 MG PO TABS: 500.0000 mg | ORAL_TABLET | Freq: Two times a day (BID) | ORAL | 0 refills | Status: DC

## 2018-12-31 MED ORDER — FLUCONAZOLE 200 MG PO TABS: 200.0000 mg | ORAL_TABLET | Freq: Every day | ORAL | 0 refills | Status: DC

## 2018-12-31 NOTE — Progress Notes (Signed)
[facility]  [phone] [phone]) [phone]    ID: Caitlyne Ingham DOB: August 03, 1971  MR#: [id number]  NOI#:[id number]  Patient Care Team: [name], MD as PCP - General Rolm Bookbinder, MD as Consulting Physician (General Surgery) Magrinat, [name], MD as Consulting Physician (Oncology) [name], MD as Attending Physician (Radiation Oncology) [name], MD as Consulting Physician (Gastroenterology) [name], MD as Consulting Physician (Obstetrics and Gynecology) OTHER MD: Dr. Siri Cole (urology)    CHIEF COMPLAINT: Triple negative breast cancer  CURRENT TREATMENT: Neoadjuvant chemotherapy, goserelin  HISTORY OF CURRENT ILLNESS: From the original intake note:  [name] Edgar presented (48 years old) with a palpable right breast lump and associated tenderness for approximately 1 week. She called her doctor immediately following  She underwent unilateral right diagnostic mammography with tomography and right breast ultrasonography at [facility] on 10/28/2018 showing: Breast Density Category C. Radiopaque BB was placed at the site of the patient's palpable lump in the central right breast. An irregular hyperdense mass with associated increased trabecular thickening is seen in the deep central right breast. On physical exam, a firm, fixed mass in the central right breast is palpated. Target ultrasound is performed, showing and irregular hypoechoic mass at the 8 o'clock position 3 cm from the nipple. It measures 3.6 x 2.9 x 2.3 cm. There is associated peripheral vascularity. Evaluation of the right axilla demonstrates a single prominent lymph node demonstrating 0.4 cm cortical thickening. The remaining lymph nodes ave normal appearance with a thin, symmetric cortex.   Accordingly on 10/29/2018 she proceeded to biopsy of the right breast area in question. The pathology from this procedure showed ([id number]): invasive ductal carcinoma, grade III. Prognostic  indicators significant for: estrogen receptor, 0% negative and progesterone receptor, 0% negative. Proliferation marker Ki67 at 80%. HER2 negative (1+) by immunohistochemistry.  Biopsy of the right axilla was performed on the same day. The pathology from this procedure showed ([id number]): lymph node, negative for carcinoma.  This was felt to be concordant  The patient's subsequent history is as detailed below.   INTERVAL HISTORY: [name] returns today for follow-up and treatment of her triple negative breast cancer.  She continues on neoadjuvant chemotherapy receiving Doxorubicin and Cyclophosphamide given on day 1 of a 14 day cycle, with udenyca support on day 3. Today is day 8 cycle 3. [name] is doing moderately well today.    REVIEW OF SYSTEMS: [name] is having increased issues with her mouth.  She notes discomfort in her mouth and throat.  She says that she is eating and drinking despite the taste changes.  She is having hot flashes.  She has some swelling her her feet.    [name] notes some areas of her skin where she had previous blood draws or biopsies and that they are hyperpigmented, and she wants to know when they will resolve.    Otherwise, she denies fever, chills, chest pain, palpitations, cough, shortness of breath.  She is without nasuea, vomiting, bowel/bladder changes.  A detailed ROS was otherwise non contributory.     PAST MEDICAL HISTORY: Past Medical History:  Diagnosis Date  . Chronic cough   . Family history of breast cancer   . GERD (gastroesophageal reflux disease)   . Hyperlipidemia   . IBS (irritable bowel syndrome)    sees Dr. Silvano Rusk   . Routine gynecological examination    sees Dr. Freda Munro     PAST SURGICAL HISTORY: Past Surgical History:  Procedure Laterality Date  . CESAREAN  SECTION    . PORTACATH PLACEMENT Right 11/14/2018   Procedure: INSERTION PORT-A-CATH WITH ULTRASOUND;  Surgeon: Rolm Bookbinder, MD;  Location: [facility];  Service: General;  Laterality: Right;  . ROOT CANAL       FAMILY HISTORY: Family History  Problem Relation Age of Onset  . Hypertension Other   . Breast cancer Other        mat first cousin's daughter  . Breast cancer Maternal Aunt   . Hypertension Mother   . Breast cancer Maternal Aunt   . Breast cancer Maternal Aunt    [name]'s father died from an unknown cause at age 36. [name] died from unknown health complications at age 56 (50) The patient has 4 sisters. [name] has 3 maternal aunts that have been diagnosed with breast cancer. Patient denies anyone in her family having ovarian, prostate, or pancreatic cancer.    GYNECOLOGIC HISTORY:  No LMP recorded. 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 Her husband, Nia Nathaniel, is a Administrator. The patient has two children, [name] and [name], from another relationship. [name] is 87, (50) lives in [place]. [name] is 92, (50) lives in [place], and is in [employer].  The patient's husband [name] has a child, [name], from another relationship; she lives in [place] and is in school. The patient has 5 grandchildren. She attends the [employer].   ADVANCED DIRECTIVES: [name]'s husband, [name], is automatically her healthcare power of attorney.     HEALTH MAINTENANCE: Social History   Tobacco Use  . Smoking status: Never Smoker  . Smokeless tobacco: Never Used  Substance Use Topics  . Alcohol use: No    Alcohol/week: 0.0 standard drinks  . Drug use: No    Colonoscopy: yes  PAP: 02/2018  Bone density: no   Allergies  Allergen Reactions  . Barbiturates Other (See Comments)    Keeps pt awake  . Sulfonamide Derivatives Itching    Current Outpatient Medications  Medication Sig Dispense Refill  . atorvastatin (LIPITOR) 20 MG tablet Take 1  tablet (20 mg total) by mouth daily. 90 tablet 3  . cyclobenzaprine (FLEXERIL) 10 MG tablet TAKE 1 TABLET BY MOUTH THREE TIMES DAILY AS NEEDED FOR MUSCLE SPASMS 90 tablet 0  . dexamethasone (DECADRON) 4 MG tablet Take 2 tablets by mouth once a day on the day after chemotherapy and then take 2 tablets two times a day for 2 days. Take with food. 30 tablet 1  . lidocaine-prilocaine (EMLA) cream Apply to affected area once 30 g 3  . loratadine (CLARITIN) 10 MG tablet Take 1 tablet (10 mg total) by mouth daily. 60 tablet 0  . LORazepam (ATIVAN) 0.5 MG tablet Take 1 tablet (0.5 mg total) by mouth at bedtime as needed (Nausea or vomiting). 30 tablet 0  . Omeprazole-Sodium Bicarbonate (ZEGERID) 20-1100 MG CAPS capsule Take 1 capsule by mouth daily before breakfast. 90 each 3  . polyethylene glycol (MIRALAX / GLYCOLAX) packet Take 17 g by mouth as needed. (Patient taking differently: Take 17 g by mouth daily as needed (constipation). Mix in 8 oz liquid and drink) 100 each 5  . potassium chloride (K-DUR) 10 MEQ tablet TAKE 1 TABLET BY MOUTH ONCE DAILY 90 tablet 3  . prochlorperazine (COMPAZINE) 10 MG tablet Take 1 tablet (10 mg total) by mouth every 6 (six)  hours as needed (Nausea or vomiting). 30 tablet 1  . traMADol (ULTRAM) 50 MG tablet Take 1-2 tablets (50-100 mg total) by mouth every 6 (six) hours as needed for moderate pain. 20 tablet 0   No current facility-administered medications for this visit.      OBJECTIVE:   Vitals:   12/31/18 1501  BP: 123/62  Pulse: (!) 105  Resp: 18  Temp: 99.2 F (37.3 C)  SpO2: 100%     Body mass index is 30.65 kg/m.   Wt Readings from Last 3 Encounters:  12/31/18 167 lb 9.6 oz (76 kg)  12/24/18 167 lb 12.8 oz (76.1 kg)  12/17/18 165 lb (74.8 kg)   ECOG FS:1 - Symptomatic but completely ambulatory GENERAL: Patient is a well appearing female in no acute distress HEENT:  Sclerae anicteric.  Cracking noted in corners of mouth, + thrush noted in posterior  pharynx. Neck is supple.  NODES:  No cervical, supraclavicular, or axillary lymphadenopathy palpated.  BREAST EXAM:  Deferred. LUNGS:  Clear to auscultation bilaterally.  No wheezes or rhonchi. HEART:  Regular rate and rhythm. No murmur appreciated. ABDOMEN:  Soft, nontender.  Positive, normoactive bowel sounds. No organomegaly palpated. MSK:  No focal spinal tenderness to palpation. Full range of motion bilaterally in the upper extremities. EXTREMITIES:  No peripheral edema.   SKIN:  Clear with no obvious rashes or skin changes. No nail dyscrasia. NEURO:  Nonfocal. Well oriented.  Appropriate affect.        LAB RESULTS:  CMP     Component Value Date/Time   NA 143 12/24/2018 0830   K 3.9 12/24/2018 0830   CL 105 12/24/2018 0830   CO2 27 12/24/2018 0830   GLUCOSE 97 12/24/2018 0830   BUN 14 12/24/2018 0830   CREATININE 0.74 12/24/2018 0830   CREATININE 0.86 11/06/2018 0821   CALCIUM 9.1 12/24/2018 0830   PROT 6.8 12/24/2018 0830   ALBUMIN 3.7 12/24/2018 0830   AST 14 (L) 12/24/2018 0830   AST 11 (L) 11/06/2018 0821   ALT 39 12/24/2018 0830   ALT 25 11/06/2018 0821   ALKPHOS 133 (H) 12/24/2018 0830   BILITOT 0.3 12/24/2018 0830   BILITOT 0.5 11/06/2018 0821   GFRNONAA >60 12/24/2018 0830   GFRNONAA >60 11/06/2018 0821   GFRAA >60 12/24/2018 0830   GFRAA >60 11/06/2018 0821    No results found for: TOTALPROTELP, ALBUMINELP, A1GS, A2GS, BETS, BETA2SER, GAMS, MSPIKE, SPEI  No results found for: KPAFRELGTCHN, LAMBDASER, KAPLAMBRATIO  Lab Results  Component Value Date   WBC 20.4 (H) 12/31/2018   NEUTROABS PENDING 12/31/2018   HGB 10.0 (L) 12/31/2018   HCT 31.4 (L) 12/31/2018   MCV 93.2 12/31/2018   PLT 274 12/31/2018    '@LASTCHEMISTRY'$ @  No results found for: LABCA2  No components found for: QMGNOI370  No results for input(s): INR in the last 168 hours.  No results found for: LABCA2  No results found for: WUG891  No results found for: QXI503  No  results found for: UUE280  No results found for: CA2729  No components found for: HGQUANT  No results found for: CEA1 / No results found for: CEA1   No results found for: AFPTUMOR  No results found for: CHROMOGRNA  No results found for: PSA1  Appointment on 12/31/2018  Component Date Value Ref Range Status  . WBC 12/31/2018 20.4* 4.0 - 10.5 K/uL Final  . RBC 12/31/2018 3.37* 3.87 - 5.11 MIL/uL Final  . Hemoglobin 12/31/2018 10.0* 12.0 -  15.0 g/dL Final  . HCT 12/31/2018 31.4* 36.0 - 46.0 % Final  . MCV 12/31/2018 93.2  80.0 - 100.0 fL Final  . MCH 12/31/2018 29.7  26.0 - 34.0 pg Final  . MCHC 12/31/2018 31.8  30.0 - 36.0 g/dL Final  . RDW 12/31/2018 14.7  11.5 - 15.5 % Final  . Platelets 12/31/2018 274  150 - 400 K/uL Final  . nRBC 12/31/2018 0.0  0.0 - 0.2 % Final   Performed at [facility], [place] [address] [place], [place] [address]  . Neutrophils Relative % 12/31/2018 PENDING  % Incomplete  . Neutro Abs 12/31/2018 PENDING  1.7 - 7.7 K/uL Incomplete  . Band Neutrophils 12/31/2018 PENDING  % Incomplete  . Lymphocytes Relative 12/31/2018 PENDING  % Incomplete  . Lymphs Abs 12/31/2018 PENDING  0.7 - 4.0 K/uL Incomplete  . Monocytes Relative 12/31/2018 PENDING  % Incomplete  . Monocytes Absolute 12/31/2018 PENDING  0.1 - 1.0 K/uL Incomplete  . Eosinophils Relative 12/31/2018 PENDING  % Incomplete  . Eosinophils Absolute 12/31/2018 PENDING  0.0 - 0.5 K/uL Incomplete  . Basophils Relative 12/31/2018 PENDING  % Incomplete  . Basophils Absolute 12/31/2018 PENDING  0.0 - 0.1 K/uL Incomplete  . WBC Morphology 12/31/2018 PENDING   Incomplete  . RBC Morphology 12/31/2018 PENDING   Incomplete  . Smear Review 12/31/2018 PENDING   Incomplete  . Other 12/31/2018 PENDING  % Incomplete  . nRBC 12/31/2018 PENDING  0 /100 WBC Incomplete  . Metamyelocytes Relative 12/31/2018 PENDING  % Incomplete  . Myelocytes 12/31/2018 PENDING  % Incomplete  . Promyelocytes  Relative 12/31/2018 PENDING  % Incomplete  . Blasts 12/31/2018 PENDING  % Incomplete    (this displays the last labs from the last 3 days)  No results found for: TOTALPROTELP, ALBUMINELP, A1GS, A2GS, BETS, BETA2SER, GAMS, MSPIKE, SPEI (this displays SPEP labs)  No results found for: KPAFRELGTCHN, LAMBDASER, KAPLAMBRATIO (kappa/lambda light chains)  No results found for: HGBA, HGBA2QUANT, HGBFQUANT, HGBSQUAN (Hemoglobinopathy evaluation)   No results found for: LDH  No results found for: IRON, TIBC, IRONPCTSAT (Iron and TIBC)  No results found for: FERRITIN  Urinalysis    Component Value Date/Time   COLORURINE STRAW (A) 12/06/2018 1014   APPEARANCEUR CLEAR 12/06/2018 1014   LABSPEC 1.009 12/06/2018 1014   PHURINE 7.0 12/06/2018 1014   GLUCOSEU NEGATIVE 12/06/2018 1014   HGBUR SMALL (A) 12/06/2018 1014   BILIRUBINUR NEGATIVE 12/06/2018 1014   BILIRUBINUR negative 05/31/2018 1011   KETONESUR NEGATIVE 12/06/2018 1014   PROTEINUR NEGATIVE 12/06/2018 1014   UROBILINOGEN 0.2 05/31/2018 1011   UROBILINOGEN 0.2 05/18/2010 1042   NITRITE NEGATIVE 12/06/2018 1014   LEUKOCYTESUR NEGATIVE 12/06/2018 1014     STUDIES:  Dg Chest 2 View  Result Date: 12/06/2018 CLINICAL DATA:  Fever that began last Night, pt states had first chemo infusion 1 week ago, breast ca EXAM: CHEST - 2 VIEW COMPARISON:  11/14/2018 FINDINGS: RIGHT-sided PowerPort tip overlies the superior vena cava. Heart size is normal. There are no focal consolidations or pleural effusions. No pulmonary edema. No suspicious lytic or blastic lesions are identified. IMPRESSION: No evidence for acute cardiopulmonary abnormality. Electronically Signed   By: Nolon Nations M.D.   On: 12/06/2018 08:51    ELIGIBLE FOR AVAILABLE RESEARCH PROTOCOL: Declined   ASSESSMENT: 48 y.o. [place], [place] woman status post right breast lower outer quadrant biopsy 10/29/2018 for a clinical T2 N0, stage IIB invasive ductal carcinoma, grade 3,  triple negative, with an MIB-1 of  80%  (a) CT of the chest 11/15/2018 shows possible liver metastases, biopsy 11/25/2018 consistent with hemangioma  (b) bone scan 11/15/2018 shows no suspicious bone lesions  (1) neoadjuvant chemotherapy will consist of doxorubicin and cyclophosphamide in dose dense fashion x4, starting 11/26/2018 to be followed by weekly paclitaxel and carboplatin x12  (2) patient desires ovarian function preservation: Zoladex started 11/08/2018  (3) definitive surgery to follow chemo  (4) adjuvant radiation to follow  (5) genetics counseling on 11/28/2018, testing was recommended, but declined by patient.   PLAN: [name] is doing moderately well today.  Her labs are stable and she is not neutropenic.  She has one more cycle of the Doxorubicin and Cyclophosphamide left, and though fatigued and having oral issues, is motivated to continue on with her treatments.    For her mucositis I added Valtrex BID, Fluconazole.  There were issues with getting her magic mouthwash at [facility].  With the assistance of Marlon Pel, RN we faxed a prescription over to [facility].    [name] will continue with social distancing, isolation, due to COVID 19.  She is wearing an appropriate mask and cleaning frequently.  She is going to continue all these things.    We will see [name] back in 1 week for labs, evaluation, and her fourth cycle of Doxorubicin and Cyclophosphamide.  She knows to call for any other issue that may develop before the next visit.  A total of (30) minutes of face-to-face time was spent with this patient with greater than 50% of that time in counseling and care-coordination.   Wilber Bihari, NP  12/31/18 3:16 PM Medical Oncology and Hematology [facility] [address] [place], [place] [address] Tel. [phone]    Fax. [phone]

## 2019-01-06 NOTE — Progress Notes (Signed)
[place]  [phone] [phone]) [phone]    ID: Brittany Rodgers DOB: 08/06/1971  MR#: [id number]  JGG#:[id number]  Patient Care Team: [name], MD as PCP - General Rolm Bookbinder, MD as Consulting Physician (General Surgery) Magrinat, [name], MD as Consulting Physician (Oncology) [name], MD as Attending Physician (Radiation Oncology) [name], MD as Consulting Physician (Gastroenterology) [name], MD as Consulting Physician (Obstetrics and Gynecology) OTHER MD: Dr. Siri Cole (urology)    CHIEF COMPLAINT: Triple negative breast cancer  CURRENT TREATMENT: Neoadjuvant chemotherapy, goserelin  HISTORY OF CURRENT ILLNESS: From the original intake note:  Brittany Rodgers presented with a palpable right breast lump and associated tenderness for approximately 1 week. She called her doctor immediately following  She underwent unilateral right diagnostic mammography with tomography and right breast ultrasonography at [facility] on 10/28/2018 showing: Breast Density Category C. Radiopaque BB was placed at the site of the patient's palpable lump in the central right breast. An irregular hyperdense mass with associated increased trabecular thickening is seen in the deep central right breast. On physical exam, a firm, fixed mass in the central right breast is palpated. Target ultrasound is performed, showing and irregular hypoechoic mass at the 8 o'clock position 3 cm from the nipple. It measures 3.6 x 2.9 x 2.3 cm. There is associated peripheral vascularity. Evaluation of the right axilla demonstrates a single prominent lymph node demonstrating 0.4 cm cortical thickening. The remaining lymph nodes ave normal appearance with a thin, symmetric cortex.   Accordingly on 10/29/2018 she proceeded to biopsy of the right breast area in question. The pathology from this procedure showed ([id number]): invasive ductal carcinoma, grade III. Prognostic  indicators significant for: estrogen receptor, 0% negative and progesterone receptor, 0% negative. Proliferation marker Ki67 at 80%. HER2 negative (1+) by immunohistochemistry.  Biopsy of the right axilla was performed on the same day. The pathology from this procedure showed ([id number]): lymph node, negative for carcinoma.  This was felt to be concordant  The patient's subsequent history is as detailed below.   INTERVAL HISTORY: Brittany Rodgers returns today for follow-up and treatment of her triple negative breast cancer.  She continues on neoadjuvant chemotherapy consisting of doxorubicin and cyclophosphamide in dose dense fashion x4, to be followed by weekly paclitaxel and carboplatin x12. Today is day 1 cycle 4. She is experiencing fatigue and appetite loss. She also developed thrush.  Most recent goserelin dose was 12/05/2018.  She is due for treatment today.   Since her last visit here, she has not undergone any additional studies.    REVIEW OF SYSTEMS: Brittany Rodgers is trying to exercise, usually walking around 5 to 10 minutes at a time, but her fatigue prevents her from doing a lot of exercise. As part of COVID-19 precautions, her husband does most of the shopping at home, but she will occasionally go out. The patient denies unusual headaches, visual changes, nausea, vomiting, or dizziness. There has been no unusual cough, phlegm production, or pleurisy. This been no change in bowel or bladder habits. The patient denies unexplained weight loss, bleeding, rash, or fever. A detailed review of systems was otherwise noncontributory.    PAST MEDICAL HISTORY: Past Medical History:  Diagnosis Date  . Chronic cough   . Family history of breast cancer   . GERD (gastroesophageal reflux disease)   . Hyperlipidemia   . IBS (irritable bowel syndrome)    sees Dr. Silvano Rusk   . Routine gynecological examination    sees Dr.  Freda Munro     PAST SURGICAL HISTORY: Past Surgical History:   Procedure Laterality Date  . CESAREAN SECTION    . PORTACATH PLACEMENT Right 11/14/2018   Procedure: INSERTION PORT-A-CATH WITH ULTRASOUND;  Surgeon: Rolm Bookbinder, MD;  Location: [facility];  Service: General;  Laterality: Right;  . ROOT CANAL       FAMILY HISTORY: Family History  Problem Relation Age of Onset  . Hypertension Other   . Breast cancer Other        mat first cousin's daughter  . Breast cancer Maternal Aunt   . Hypertension Mother   . Breast cancer Maternal Aunt   . Breast cancer Maternal Aunt    Brittany's father died from an unknown cause at age 50. [name] died from unknown health complications at age 29. The patient has 4 sisters. Brittany Rodgers has 3 maternal aunts that have been diagnosed with breast cancer. Patient denies anyone in her family having ovarian, prostate, or pancreatic cancer.    GYNECOLOGIC HISTORY:  No LMP recorded. 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 Her husband, Jamia Hoban, is a Administrator. The patient has two children, [name] and [name], from another relationship. [name] is 35, lives in [place]. [name] is 104, lives in [place], and is in [employer].  The patient's husband [name] has a child, [name], from another relationship; she lives in [place] and is in school. The patient has 5 grandchildren. She attends the [employer].   ADVANCED DIRECTIVES: Brittany's husband, [name], is automatically her healthcare power of attorney.     HEALTH MAINTENANCE: Social History   Tobacco Use  . Smoking status: Never Smoker  . Smokeless tobacco: Never Used  Substance Use Topics  . Alcohol use: No    Alcohol/week: 0.0 standard drinks  . Drug use: No    Colonoscopy: yes  PAP: 02/2018  Bone density: no   Allergies  Allergen Reactions  .  Barbiturates Other (See Comments)    Keeps pt awake  . Sulfonamide Derivatives Itching    Current Outpatient Medications  Medication Sig Dispense Refill  . atorvastatin (LIPITOR) 20 MG tablet Take 1 tablet (20 mg total) by mouth daily. 90 tablet 3  . cyclobenzaprine (FLEXERIL) 10 MG tablet TAKE 1 TABLET BY MOUTH THREE TIMES DAILY AS NEEDED FOR MUSCLE SPASMS 90 tablet 0  . dexamethasone (DECADRON) 4 MG tablet Take 2 tablets by mouth once a day on the day after chemotherapy and then take 2 tablets two times a day for 2 days. Take with food. 30 tablet 1  . fluconazole (DIFLUCAN) 200 MG tablet Take 1 tablet (200 mg total) by mouth daily. 7 tablet 0  . lidocaine-prilocaine (EMLA) cream Apply to affected area once 30 g 3  . loratadine (CLARITIN) 10 MG tablet Take 1 tablet (10 mg total) by mouth daily. 60 tablet 0  . LORazepam (ATIVAN) 0.5 MG tablet Take 1 tablet (0.5 mg total) by mouth at bedtime as needed (Nausea or vomiting). 30 tablet 0  . magic mouthwash SOLN Take 5 mLs by mouth 3 (three) times daily as needed for mouth pain. 240 mL 0  . Omeprazole-Sodium Bicarbonate (ZEGERID) 20-1100 MG CAPS capsule Take 1 capsule by mouth daily before breakfast. 90 each 3  . polyethylene glycol (MIRALAX / GLYCOLAX) packet Take 17 g by mouth  as needed. (Patient taking differently: Take 17 g by mouth daily as needed (constipation). Mix in 8 oz liquid and drink) 100 each 5  . potassium chloride (K-DUR) 10 MEQ tablet TAKE 1 TABLET BY MOUTH ONCE DAILY 90 tablet 3  . prochlorperazine (COMPAZINE) 10 MG tablet Take 1 tablet (10 mg total) by mouth every 6 (six) hours as needed (Nausea or vomiting). 30 tablet 1  . traMADol (ULTRAM) 50 MG tablet Take 1-2 tablets (50-100 mg total) by mouth every 6 (six) hours as needed for moderate pain. 20 tablet 0  . valACYclovir (VALTREX) 500 MG tablet Take 1 tablet (500 mg total) by mouth 2 (two) times daily. 60 tablet 0   No current facility-administered medications for this visit.       OBJECTIVE: Young African-American woman who appears stated age  Vitals:   01/07/19 0923  BP: 129/73  Pulse: 74  Resp: 18  Temp: 98.3 F (36.8 C)  SpO2: 100%     Body mass index is 30.33 kg/m.   Wt Readings from Last 3 Encounters:  01/07/19 165 lb 12.8 oz (75.2 kg)  12/31/18 167 lb 9.6 oz (76 kg)  12/24/18 167 lb 12.8 oz (76.1 kg)   ECOG FS:1 - Symptomatic but completely ambulatory  Sclerae unicteric, EOMs intact Wearing a mask No cervical or supraclavicular adenopathy Lungs no rales or rhonchi Heart regular rate and rhythm Abd soft, nontender, positive bowel sounds MSK no focal spinal tenderness, no upper extremity lymphedema Neuro: nonfocal, well oriented, appropriate affect Breasts: The right breast lower outer quadrant mass is still palpable   LAB RESULTS:  CMP     Component Value Date/Time   NA 141 01/07/2019 0833   K 4.5 01/07/2019 0833   CL 103 01/07/2019 0833   CO2 24 01/07/2019 0833   GLUCOSE 88 01/07/2019 0833   BUN 12 01/07/2019 0833   CREATININE 0.72 01/07/2019 0833   CREATININE 0.86 11/06/2018 0821   CALCIUM 9.1 01/07/2019 0833   PROT 6.9 01/07/2019 0833   ALBUMIN 3.7 01/07/2019 0833   AST 9 (L) 01/07/2019 0833   AST 11 (L) 11/06/2018 0821   ALT 23 01/07/2019 0833   ALT 25 11/06/2018 0821   ALKPHOS 159 (H) 01/07/2019 0833   BILITOT 0.3 01/07/2019 0833   BILITOT 0.5 11/06/2018 0821   GFRNONAA >60 01/07/2019 0833   GFRNONAA >60 11/06/2018 0821   GFRAA >60 01/07/2019 0833   GFRAA >60 11/06/2018 0821    No results found for: TOTALPROTELP, ALBUMINELP, A1GS, A2GS, BETS, BETA2SER, GAMS, MSPIKE, SPEI  No results found for: KPAFRELGTCHN, LAMBDASER, KAPLAMBRATIO  Lab Results  Component Value Date   WBC 22.5 (H) 01/07/2019   NEUTROABS 17.5 (H) 01/07/2019   HGB 11.1 (L) 01/07/2019   HCT 34.8 (L) 01/07/2019   MCV 93.5 01/07/2019   PLT 205 01/07/2019    '@LASTCHEMISTRY'$ @  No results found for: LABCA2  No components found for:  [id number]  No results for input(s): INR in the last 168 hours.  No results found for: LABCA2  No results found for: SAY301  No results found for: SWF093  No results found for: ATF573  No results found for: CA2729  No components found for: HGQUANT  No results found for: CEA1 / No results found for: CEA1   No results found for: AFPTUMOR  No results found for: CHROMOGRNA  No results found for: PSA1  Appointment on 01/07/2019  Component Date Value Ref Range Status  . WBC 01/07/2019 22.5* 4.0 - 10.5 K/uL Final  .  RBC 01/07/2019 3.72* 3.87 - 5.11 MIL/uL Final  . Hemoglobin 01/07/2019 11.1* 12.0 - 15.0 g/dL Final  . HCT 01/07/2019 34.8* 36.0 - 46.0 % Final  . MCV 01/07/2019 93.5  80.0 - 100.0 fL Final  . MCH 01/07/2019 29.8  26.0 - 34.0 pg Final  . MCHC 01/07/2019 31.9  30.0 - 36.0 g/dL Final  . RDW 01/07/2019 17.3* 11.5 - 15.5 % Final  . Platelets 01/07/2019 205  150 - 400 K/uL Final  . nRBC 01/07/2019 0.7* 0.0 - 0.2 % Final  . Neutrophils Relative % 01/07/2019 77  % Final  . Neutro Abs 01/07/2019 17.5* 1.7 - 7.7 K/uL Final  . Lymphocytes Relative 01/07/2019 6  % Final  . Lymphs Abs 01/07/2019 1.3  0.7 - 4.0 K/uL Final  . Monocytes Relative 01/07/2019 5  % Final  . Monocytes Absolute 01/07/2019 1.0  0.1 - 1.0 K/uL Final  . Eosinophils Relative 01/07/2019 0  % Final  . Eosinophils Absolute 01/07/2019 0.0  0.0 - 0.5 K/uL Final  . Basophils Relative 01/07/2019 0  % Final  . Basophils Absolute 01/07/2019 0.0  0.0 - 0.1 K/uL Final  . Immature Granulocytes 01/07/2019 12  % Final   Increased IG's, likely caused by Bone Marrow Colony Stimulating Factor received within 30 days.  . Abs Immature Granulocytes 01/07/2019 2.61* 0.00 - 0.07 K/uL Final  . Ovalocytes 01/07/2019 PRESENT   Final   Performed at [facility], [place] [address], [place], [place] [address]  . Sodium 01/07/2019 141  135 - 145 mmol/L Final  . Potassium 01/07/2019 4.5  3.5 - 5.1 mmol/L Final   . Chloride 01/07/2019 103  98 - 111 mmol/L Final  . CO2 01/07/2019 24  22 - 32 mmol/L Final  . Glucose, Bld 01/07/2019 88  70 - 99 mg/dL Final  . BUN 01/07/2019 12  6 - 20 mg/dL Final  . Creatinine, Ser 01/07/2019 0.72  0.44 - 1.00 mg/dL Final  . Calcium 01/07/2019 9.1  8.9 - 10.3 mg/dL Final  . Total Protein 01/07/2019 6.9  6.5 - 8.1 g/dL Final  . Albumin 01/07/2019 3.7  3.5 - 5.0 g/dL Final  . AST 01/07/2019 9* 15 - 41 U/L Final  . ALT 01/07/2019 23  0 - 44 U/L Final  . Alkaline Phosphatase 01/07/2019 159* 38 - 126 U/L Final  . Total Bilirubin 01/07/2019 0.3  0.3 - 1.2 mg/dL Final  . GFR calc non Af Amer 01/07/2019 >60  >60 mL/min Final  . GFR calc Af Amer 01/07/2019 >60  >60 mL/min Final  . Anion gap 01/07/2019 14  5 - 15 Final   Performed at [facility], [address] [place], [place] [address]    (this displays the last labs from the last 3 days)  No results found for: TOTALPROTELP, ALBUMINELP, A1GS, A2GS, BETS, BETA2SER, GAMS, MSPIKE, SPEI (this displays SPEP labs)  No results found for: KPAFRELGTCHN, LAMBDASER, KAPLAMBRATIO (kappa/lambda light chains)  No results found for: HGBA, HGBA2QUANT, HGBFQUANT, HGBSQUAN (Hemoglobinopathy evaluation)   No results found for: LDH  No results found for: IRON, TIBC, IRONPCTSAT (Iron and TIBC)  No results found for: FERRITIN  Urinalysis    Component Value Date/Time   COLORURINE STRAW (A) 12/06/2018 [place] 12/06/2018 1014   LABSPEC 1.009 12/06/2018 1014   PHURINE 7.0 12/06/2018 1014   GLUCOSEU NEGATIVE 12/06/2018 1014   [place] (A) 12/06/2018 [place] 12/06/2018 1014   BILIRUBINUR  negative 05/31/2018 1011   KETONESUR NEGATIVE 12/06/2018 1014   PROTEINUR NEGATIVE 12/06/2018 1014   UROBILINOGEN 0.2 05/31/2018 1011   UROBILINOGEN 0.2 05/18/2010 1042   NITRITE NEGATIVE 12/06/2018 1014   LEUKOCYTESUR NEGATIVE 12/06/2018 1014     STUDIES:  No results  found.   ELIGIBLE FOR AVAILABLE RESEARCH PROTOCOL: Declined   ASSESSMENT: 48 y.o. [place], [place] woman status post right breast lower outer quadrant biopsy 10/29/2018 for a clinical T2 N0, stage IIB invasive ductal carcinoma, grade 3, triple negative, with an MIB-1 of 80%  (a) CT of the chest 11/15/2018 shows possible liver metastases, biopsy 11/25/2018 consistent with hemangioma  (b) bone scan 11/15/2018 shows no suspicious bone lesions  (1) neoadjuvant chemotherapy consisting of doxorubicin and cyclophosphamide in dose dense fashion x4, started 11/26/2018, completed 01/07/2019, to be followed by weekly paclitaxel and carboplatin x12 starting 01/28/2019  (2) patient desires ovarian function preservation: Zoladex started 11/08/2018  (3) definitive surgery to follow chemo  (4) adjuvant radiation to follow  (5) genetics counseling on 11/28/2018, testing was recommended, but declined by patient.   PLAN: Brittany Rodgers is pretty miserable.  Part of it is of course the chemotherapy, although her counts today are excellent.  Part of it is being cooped up at home and not being able to do very much.  Part of it is worried because her cancer is still palpable.  We are proceeding with the fourth and final cycle of doxorubicin and cyclophosphamide today.  Because she has been feeling so poorly, we are going to start the Taxol and carboplatin 3 weeks from today instead of 2 weeks from today.  This will also reduce the exposure time here during the current pandemic.  At the next visit, which will be 01/28/19/2020 I will make sure to again do a baseline right breast exam and if it appears there has been no significant change we will obtain additional imaging to document exact sizes.  If there is any evidence this tumor has grown it might be preferable to send her earlier to surgery and then complete chemotherapy afterwards  She is taking appropriate pandemic precautions.  There are significant financial  issues stressing the family as well.  Finally she is starting to have significant menopausal symptoms, particularly hot flashes, from her goserelin.  I have asked her to call us with any problems that may develop before the next visit here.    Magrinat, [name], MD  01/07/19 9:41 AM Medical Oncology and Hematology [facility] [address] [place], [place] [address] Tel. [phone]    Fax. [phone]  I, Jacqualyn Posey am acting as a Education administrator for Chauncey Cruel, MD.   I, Lurline Del MD, have reviewed the above documentation for accuracy and completeness, and I agree with the above.

## 2019-01-07 ENCOUNTER — Inpatient Hospital Stay: Payer: BLUE CROSS/BLUE SHIELD

## 2019-01-07 ENCOUNTER — Other Ambulatory Visit: Payer: Self-pay

## 2019-01-07 ENCOUNTER — Inpatient Hospital Stay (HOSPITAL_BASED_OUTPATIENT_CLINIC_OR_DEPARTMENT_OTHER): Payer: BLUE CROSS/BLUE SHIELD | Admitting: Oncology

## 2019-01-07 VITALS — BP 129/73 | HR 74 | Temp 98.3°F | Resp 18 | Ht 62.0 in | Wt 165.8 lb

## 2019-01-07 DIAGNOSIS — C50511 Malignant neoplasm of lower-outer quadrant of right female breast: Secondary | ICD-10-CM

## 2019-01-07 DIAGNOSIS — Z171 Estrogen receptor negative status [ER-]: Principal | ICD-10-CM

## 2019-01-07 DIAGNOSIS — N951 Menopausal and female climacteric states: Secondary | ICD-10-CM

## 2019-01-07 DIAGNOSIS — B379 Candidiasis, unspecified: Secondary | ICD-10-CM | POA: Diagnosis not present

## 2019-01-07 DIAGNOSIS — R5383 Other fatigue: Secondary | ICD-10-CM

## 2019-01-07 DIAGNOSIS — Z95828 Presence of other vascular implants and grafts: Secondary | ICD-10-CM

## 2019-01-07 DIAGNOSIS — R630 Anorexia: Secondary | ICD-10-CM

## 2019-01-07 DIAGNOSIS — Z5111 Encounter for antineoplastic chemotherapy: Secondary | ICD-10-CM | POA: Diagnosis not present

## 2019-01-07 LAB — COMPREHENSIVE METABOLIC PANEL
ALT: 23 U/L (ref 0–44)
AST: 9 U/L — ABNORMAL LOW (ref 15–41)
Albumin: 3.7 g/dL (ref 3.5–5.0)
Alkaline Phosphatase: 159 U/L — ABNORMAL HIGH (ref 38–126)
Anion gap: 14 (ref 5–15)
BUN: 12 mg/dL (ref 6–20)
CO2: 24 mmol/L (ref 22–32)
Calcium: 9.1 mg/dL (ref 8.9–10.3)
Chloride: 103 mmol/L (ref 98–111)
Creatinine, Ser: 0.72 mg/dL (ref 0.44–1.00)
GFR calc Af Amer: >60 mL/min (ref >60)
GFR calc non Af Amer: >60 mL/min (ref >60)
Glucose, Bld: 88 mg/dL (ref 70–99)
Potassium: 4.5 mmol/L (ref 3.5–5.1)
Sodium: 141 mmol/L (ref 135–145)
Total Bilirubin: 0.3 mg/dL (ref 0.3–1.2)
Total Protein: 6.9 g/dL (ref 6.5–8.1)

## 2019-01-07 LAB — CBC WITH DIFFERENTIAL/PLATELET
Abs Immature Granulocytes: 2.61 10*3/uL — ABNORMAL HIGH (ref 0.00–0.07)
Basophils Absolute: 0 10*3/uL (ref 0.0–0.1)
Basophils Relative: 0 %
Eosinophils Absolute: 0 10*3/uL (ref 0.0–0.5)
Eosinophils Relative: 0 %
HCT: 34.8 % — ABNORMAL LOW (ref 36.0–46.0)
Hemoglobin: 11.1 g/dL — ABNORMAL LOW (ref 12.0–15.0)
Immature Granulocytes: 12 %
Lymphocytes Relative: 6 %
Lymphs Abs: 1.3 10*3/uL (ref 0.7–4.0)
MCH: 29.8 pg (ref 26.0–34.0)
MCHC: 31.9 g/dL (ref 30.0–36.0)
MCV: 93.5 fL (ref 80.0–100.0)
Monocytes Absolute: 1 10*3/uL (ref 0.1–1.0)
Monocytes Relative: 5 %
Neutro Abs: 17.5 10*3/uL — ABNORMAL HIGH (ref 1.7–7.7)
Neutrophils Relative %: 77 %
Platelets: 205 10*3/uL (ref 150–400)
RBC: 3.72 MIL/uL — ABNORMAL LOW (ref 3.87–5.11)
RDW: 17.3 % — ABNORMAL HIGH (ref 11.5–15.5)
WBC: 22.5 10*3/uL — ABNORMAL HIGH (ref 4.0–10.5)
nRBC: 0.7 % — ABNORMAL HIGH (ref 0.0–0.2)

## 2019-01-07 MED ORDER — GOSERELIN ACETATE 3.6 MG ~~LOC~~ IMPL
3.6000 mg | DRUG_IMPLANT | Freq: Once | SUBCUTANEOUS | Status: AC
  Administered 2019-01-07: 11:00:00 3.6 mg via SUBCUTANEOUS

## 2019-01-07 MED ORDER — SODIUM CHLORIDE 0.9% FLUSH
10.0000 mL | Freq: Once | INTRAVENOUS | Status: AC
  Administered 2019-01-07: 10 mL
  Filled 2019-01-07: qty 10

## 2019-01-07 MED ORDER — SODIUM CHLORIDE 0.9 % IV SOLN
600.0000 mg/m2 | Freq: Once | INTRAVENOUS | Status: AC
  Administered 2019-01-07: 1060 mg via INTRAVENOUS
  Filled 2019-01-07: qty 53

## 2019-01-07 MED ORDER — GOSERELIN ACETATE 3.6 MG ~~LOC~~ IMPL
DRUG_IMPLANT | SUBCUTANEOUS | Status: AC
  Filled 2019-01-07: qty 3.6

## 2019-01-07 MED ORDER — SODIUM CHLORIDE 0.9 % IV SOLN
Freq: Once | INTRAVENOUS | Status: AC
  Administered 2019-01-07: 10:00:00 via INTRAVENOUS
  Filled 2019-01-07: qty 250

## 2019-01-07 MED ORDER — HEPARIN SOD (PORK) LOCK FLUSH 100 UNIT/ML IV SOLN
500.0000 [IU] | Freq: Once | INTRAVENOUS | Status: AC | PRN
  Administered 2019-01-07: 500 [IU]
  Filled 2019-01-07: qty 5

## 2019-01-07 MED ORDER — SODIUM CHLORIDE 0.9% FLUSH
10.0000 mL | INTRAVENOUS | Status: DC | PRN
  Administered 2019-01-07: 13:00:00 10 mL
  Filled 2019-01-07: qty 10

## 2019-01-07 MED ORDER — PALONOSETRON HCL INJECTION 0.25 MG/5ML
0.2500 mg | Freq: Once | INTRAVENOUS | Status: AC
  Administered 2019-01-07: 10:00:00 0.25 mg via INTRAVENOUS

## 2019-01-07 MED ORDER — SODIUM CHLORIDE 0.9 % IV SOLN
Freq: Once | INTRAVENOUS | Status: AC
  Administered 2019-01-07: 10:00:00 via INTRAVENOUS
  Filled 2019-01-07: qty 5

## 2019-01-07 MED ORDER — DOXORUBICIN HCL CHEMO IV INJECTION 2 MG/ML
60.0000 mg/m2 | Freq: Once | INTRAVENOUS | Status: AC
  Administered 2019-01-07: 106 mg via INTRAVENOUS
  Filled 2019-01-07: qty 53

## 2019-01-07 MED ORDER — PALONOSETRON HCL INJECTION 0.25 MG/5ML
INTRAVENOUS | Status: AC
  Filled 2019-01-07: qty 5

## 2019-01-07 NOTE — Patient Instructions (Signed)
[facility] Discharge Instructions for Patients Receiving Chemotherapy  Today you received the following chemotherapy agents: doxorubicin (Adriamycin) and cyclophosphamide (Cytoxan)   To help prevent nausea and vomiting after your treatment, we encourage you to take your nausea medication as prescribed by your physician.   If you develop nausea and vomiting that is not controlled by your nausea medication, call the clinic.   BELOW ARE SYMPTOMS THAT SHOULD BE REPORTED IMMEDIATELY:  *FEVER GREATER THAN 100.5 F  *CHILLS WITH OR WITHOUT FEVER  NAUSEA AND VOMITING THAT IS NOT CONTROLLED WITH YOUR NAUSEA MEDICATION  *UNUSUAL SHORTNESS OF BREATH  *UNUSUAL BRUISING OR BLEEDING  TENDERNESS IN MOUTH AND THROAT WITH OR WITHOUT PRESENCE OF ULCERS  *URINARY PROBLEMS  *BOWEL PROBLEMS  UNUSUAL RASH Items with * indicate a potential emergency and should be followed up as soon as possible.  Feel free to call the clinic should you have any questions or concerns. The clinic phone number is [phone].  Please show the [facility] at check-in to the Emergency Department and triage nurse.

## 2019-01-09 ENCOUNTER — Telehealth: Payer: Self-pay | Admitting: *Deleted

## 2019-01-09 ENCOUNTER — Other Ambulatory Visit: Payer: Self-pay

## 2019-01-09 ENCOUNTER — Inpatient Hospital Stay: Payer: BLUE CROSS/BLUE SHIELD

## 2019-01-09 VITALS — BP 136/75 | HR 86 | Temp 97.6°F | Resp 18

## 2019-01-09 DIAGNOSIS — Z5111 Encounter for antineoplastic chemotherapy: Secondary | ICD-10-CM | POA: Diagnosis not present

## 2019-01-09 DIAGNOSIS — C50511 Malignant neoplasm of lower-outer quadrant of right female breast: Secondary | ICD-10-CM

## 2019-01-09 DIAGNOSIS — Z171 Estrogen receptor negative status [ER-]: Principal | ICD-10-CM

## 2019-01-09 MED ORDER — PEGFILGRASTIM-CBQV 6 MG/0.6ML ~~LOC~~ SOSY
6.0000 mg | PREFILLED_SYRINGE | Freq: Once | SUBCUTANEOUS | Status: AC
  Administered 2019-01-09: 12:00:00 6 mg via SUBCUTANEOUS

## 2019-01-09 MED ORDER — PEGFILGRASTIM-CBQV 6 MG/0.6ML ~~LOC~~ SOSY
PREFILLED_SYRINGE | SUBCUTANEOUS | Status: AC
  Filled 2019-01-09: qty 0.6

## 2019-01-09 NOTE — Patient Instructions (Signed)
Pegfilgrastim injection  What is this medicine?  PEGFILGRASTIM (PEG fil gra stim) is a long-acting granulocyte colony-stimulating factor that stimulates the growth of neutrophils, a type of white blood cell important in the body's fight against infection. It is used to reduce the incidence of fever and infection in patients with certain types of cancer who are receiving chemotherapy that affects the bone marrow, and to increase survival after being exposed to high doses of radiation.  This medicine may be used for other purposes; ask your health care provider or pharmacist if you have questions.  COMMON BRAND NAME(S): Fulphila, Neulasta, UDENYCA  What should I tell my health care provider before I take this medicine?  They need to know if you have any of these conditions:  -kidney disease  -latex allergy  -ongoing radiation therapy  -sickle cell disease  -skin reactions to acrylic adhesives (On-Body Injector only)  -an unusual or allergic reaction to pegfilgrastim, filgrastim, other medicines, foods, dyes, or preservatives  -pregnant or trying to get pregnant  -breast-feeding  How should I use this medicine?  This medicine is for injection under the skin. If you get this medicine at home, you will be taught how to prepare and give the pre-filled syringe or how to use the On-body Injector. Refer to the patient Instructions for Use for detailed instructions. Use exactly as directed. Tell your healthcare provider immediately if you suspect that the On-body Injector may not have performed as intended or if you suspect the use of the On-body Injector resulted in a missed or partial dose.  It is important that you put your used needles and syringes in a special sharps container. Do not put them in a trash can. If you do not have a sharps container, call your pharmacist or healthcare provider to get one.  Talk to your pediatrician regarding the use of this medicine in children. While this drug may be prescribed for  selected conditions, precautions do apply.  Overdosage: If you think you have taken too much of this medicine contact a poison control center or emergency room at once.  NOTE: This medicine is only for you. Do not share this medicine with others.  What if I miss a dose?  It is important not to miss your dose. Call your doctor or health care professional if you miss your dose. If you miss a dose due to an On-body Injector failure or leakage, a new dose should be administered as soon as possible using a single prefilled syringe for manual use.  What may interact with this medicine?  Interactions have not been studied.  Give your health care provider a list of all the medicines, herbs, non-prescription drugs, or dietary supplements you use. Also tell them if you smoke, drink alcohol, or use illegal drugs. Some items may interact with your medicine.  This list may not describe all possible interactions. Give your health care provider a list of all the medicines, herbs, non-prescription drugs, or dietary supplements you use. Also tell them if you smoke, drink alcohol, or use illegal drugs. Some items may interact with your medicine.  What should I watch for while using this medicine?  You may need blood work done while you are taking this medicine.  If you are going to need a MRI, CT scan, or other procedure, tell your doctor that you are using this medicine (On-Body Injector only).  What side effects may I notice from receiving this medicine?  Side effects that you should report to   your doctor or health care professional as soon as possible:  -allergic reactions like skin rash, itching or hives, swelling of the face, lips, or tongue  -back pain  -dizziness  -fever  -pain, redness, or irritation at site where injected  -pinpoint red spots on the skin  -red or dark-brown urine  -shortness of breath or breathing problems  -stomach or side pain, or pain at the shoulder  -swelling  -tiredness  -trouble passing urine or  change in the amount of urine  Side effects that usually do not require medical attention (report to your doctor or health care professional if they continue or are bothersome):  -bone pain  -muscle pain  This list may not describe all possible side effects. Call your doctor for medical advice about side effects. You may report side effects to FDA at 1-800-FDA-1088.  Where should I keep my medicine?  Keep out of the reach of children.  If you are using this medicine at home, you will be instructed on how to store it. Throw away any unused medicine after the expiration date on the label.  NOTE: This sheet is a summary. It may not cover all possible information. If you have questions about this medicine, talk to your doctor, pharmacist, or health care provider.   2019 [employer] Standard (2017-12-17 16:57:08)

## 2019-01-09 NOTE — Telephone Encounter (Signed)
Left message to check in to see how patient is doing with her chemo.

## 2019-01-10 ENCOUNTER — Telehealth: Payer: Self-pay | Admitting: Oncology

## 2019-01-10 NOTE — Telephone Encounter (Signed)
Tried to reach regarding schedule mailed °

## 2019-01-14 ENCOUNTER — Other Ambulatory Visit: Payer: BLUE CROSS/BLUE SHIELD

## 2019-01-14 ENCOUNTER — Ambulatory Visit: Payer: BLUE CROSS/BLUE SHIELD | Admitting: Adult Health

## 2019-01-16 ENCOUNTER — Other Ambulatory Visit: Payer: Self-pay | Admitting: Adult Health

## 2019-01-16 ENCOUNTER — Other Ambulatory Visit: Payer: Self-pay | Admitting: *Deleted

## 2019-01-16 DIAGNOSIS — K1230 Oral mucositis (ulcerative), unspecified: Secondary | ICD-10-CM

## 2019-01-16 DIAGNOSIS — C50511 Malignant neoplasm of lower-outer quadrant of right female breast: Secondary | ICD-10-CM

## 2019-01-16 DIAGNOSIS — Z171 Estrogen receptor negative status [ER-]: Principal | ICD-10-CM

## 2019-01-16 MED ORDER — FLUCONAZOLE 200 MG PO TABS: 200.0000 mg | ORAL_TABLET | Freq: Every day | ORAL | 0 refills | Status: DC

## 2019-01-27 NOTE — Progress Notes (Signed)
[place]  Telephone:([phone]) [phone] Fax:(336) [phone]    ID: Brittany Rodgers DOB: 1970/11/19  MR#: [id number]  MOL#:[id number]  Patient Care Team: Brittany Morale, MD as PCP - Brittany Draft, MD as Consulting Physician (General Surgery) Brittany Rodgers, Brittany Dad, MD as Consulting Physician (Oncology) Brittany Gibson, MD as Attending Physician (Radiation Oncology) Brittany Mayer, MD as Consulting Physician (Gastroenterology) Brittany Millers, MD as Consulting Physician (Obstetrics and Gynecology) OTHER MD: Dr. Siri Rodgers (urology)    CHIEF COMPLAINT: Triple negative breast cancer  CURRENT TREATMENT: Neoadjuvant chemotherapy, goserelin   INTERVAL HISTORY: Brittany Rodgers returns today for follow-up and treatment of her triple negative breast cancer.  She continues on neoadjuvant chemotherapy consisting of doxorubicin and cyclophosphamide in dose dense fashion x4, to be followed by weekly paclitaxel and carboplatin x12. Today is day 1 cycle 1 of her 12 planned doses of Taxol and carboplatin. She reports severe fatigue and aches in her limbs, especially her legs, and back.  Most recent goserelin dose was 01/07/2019.   Since her last visit here, she has not undergone any additional studies.    REVIEW OF SYSTEMS: Brittany Rodgers reports feeling very tired, causing her to have trouble doing anything. She also reports new issues with her right breast, including pain in her right arm and a new bump at the bottom of her breast. She also reports more frequent headaches, which worsen when she lies down. She notes she does have allergies and takes Zyrtec for relief. She also reports constipation, for which she takes Miralax for relief. She states she tries to go out in her yard to exercise, but she lacks the energy to do that and chores around the house. She lives at home with her husband, who works outside the home. They are working to prevent the spread of Covid-19.   The patient denies unusual  headaches, visual changes, nausea, vomiting, stiff neck, dizziness, or gait imbalance. There has been no cough, phlegm production, or pleurisy, no chest pain or pressure, and no change in bowel or bladder habits. The patient denies fever, rash, bleeding, unexplained fatigue or unexplained weight loss. A detailed review of systems was otherwise entirely negative.   HISTORY OF CURRENT ILLNESS: From the original intake note:  Brittany Rodgers presented with a palpable right breast lump and associated tenderness for approximately 1 week. She called her doctor immediately following  She underwent unilateral right diagnostic mammography with tomography and right breast ultrasonography at [facility] on 10/28/2018 showing: Breast Density Category C. Radiopaque BB was placed at the site of the patient's palpable lump in the central right breast. An irregular hyperdense mass with associated increased trabecular thickening is seen in the deep central right breast. On physical exam, a firm, fixed mass in the central right breast is palpated. Target ultrasound is performed, showing and irregular hypoechoic mass at the 8 o'clock position 3 cm from the nipple. It measures 3.6 x 2.9 x 2.3 cm. There is associated peripheral vascularity. Evaluation of the right axilla demonstrates a single prominent lymph node demonstrating 0.4 cm cortical thickening. The remaining lymph nodes ave normal appearance with a thin, symmetric cortex.   Accordingly on 10/29/2018 she proceeded to biopsy of the right breast area in question. The pathology from this procedure showed ([id number]): invasive ductal carcinoma, grade III. Prognostic indicators significant for: estrogen receptor, 0% negative and progesterone receptor, 0% negative. Proliferation marker Ki67 at 80%. HER2 negative (1+) by immunohistochemistry.  Biopsy of the right axilla was performed on the same  day. The pathology from this procedure showed ([id number]): lymph node,  negative for carcinoma.  This was felt to be concordant  The patient's subsequent history is as detailed below.   PAST MEDICAL HISTORY: Past Medical History:  Diagnosis Date  . Chronic cough   . Family history of breast cancer   . GERD (gastroesophageal reflux disease)   . Hyperlipidemia   . IBS (irritable bowel syndrome)    sees Dr. Silvano Rodgers   . Routine gynecological examination    sees Dr. Freda Rodgers     PAST SURGICAL HISTORY: Past Surgical History:  Procedure Laterality Date  . CESAREAN SECTION    . PORTACATH PLACEMENT Right 11/14/2018   Procedure: INSERTION PORT-A-CATH WITH ULTRASOUND;  Surgeon: Brittany Bookbinder, MD;  Location: [facility];  Service: General;  Laterality: Right;  . ROOT CANAL       FAMILY HISTORY: Family History  Problem Relation Age of Onset  . Hypertension Other   . Breast cancer Other        mat first cousin's daughter  . Breast cancer Maternal Aunt   . Hypertension Mother   . Breast cancer Maternal Aunt   . Breast cancer Maternal Aunt    Brittany's father died from an unknown cause at age 56. [name] died from unknown health complications at age 36. The patient has 4 sisters. Brittany Rodgers has 3 maternal aunts that have been diagnosed with breast cancer. Patient denies anyone in her family having ovarian, prostate, or pancreatic cancer.    GYNECOLOGIC HISTORY:  No LMP recorded. 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 Her husband, Brittany Rodgers, is a Administrator. The patient has two children, Brittany Rodgers and Brittany Rodgers, from another relationship. Brittany Rodgers is 50, lives in [place]. Brittany Rodgers is 48, lives in [place], and is in [employer].  The patient's husband Brittany Rodgers has a child, Brittany Rodgers, from another relationship; she lives in [place] and is in school. The  patient has 5 grandchildren. She attends the [employer].   ADVANCED DIRECTIVES: Brittany's husband, Brittany Rodgers, is automatically her healthcare power of attorney.     HEALTH MAINTENANCE: Social History   Tobacco Use  . Smoking status: Never Smoker  . Smokeless tobacco: Never Used  Substance Use Topics  . Alcohol use: No    Alcohol/week: 0.0 standard drinks  . Drug use: No    Colonoscopy: yes  PAP: 02/2018  Bone density: no   Allergies  Allergen Reactions  . Barbiturates Other (See Comments)    Keeps pt awake  . Sulfonamide Derivatives Itching    Current Outpatient Medications  Medication Sig Dispense Refill  . atorvastatin (LIPITOR) 20 MG tablet Take 1 tablet (20 mg total) by mouth daily. 90 tablet 3  . cyclobenzaprine (FLEXERIL) 10 MG tablet TAKE 1 TABLET BY MOUTH THREE TIMES DAILY AS NEEDED FOR MUSCLE SPASMS 90 tablet 0  . dexamethasone (DECADRON) 4 MG tablet Take 2 tablets by mouth once a day on the day after chemotherapy and then take 2 tablets two times a day for 2 days. Take with food. 30 tablet 1  . fluconazole (DIFLUCAN) 200 MG tablet Take 1 tablet by mouth daily 7 tablet 0  . lidocaine-prilocaine (EMLA) cream Apply to affected area once 30 g 3  . loratadine (CLARITIN) 10 MG tablet Take 1 tablet (10 mg total) by  mouth daily. 60 tablet 0  . LORazepam (ATIVAN) 0.5 MG tablet Take 1 tablet (0.5 mg total) by mouth at bedtime as needed (Nausea or vomiting). 30 tablet 0  . magic mouthwash SOLN Take 5 mLs by mouth 3 (three) times daily as needed for mouth pain. 240 mL 0  . Omeprazole-Sodium Bicarbonate (ZEGERID) 20-1100 MG CAPS capsule Take 1 capsule by mouth daily before breakfast. 90 each 3  . polyethylene glycol (MIRALAX / GLYCOLAX) packet Take 17 g by mouth as needed. (Patient taking differently: Take 17 g by mouth daily as needed (constipation). Mix in 8 oz liquid and drink) 100 each 5  . potassium chloride (K-DUR) 10 MEQ tablet TAKE 1 TABLET BY MOUTH ONCE DAILY 90 tablet 3   . prochlorperazine (COMPAZINE) 10 MG tablet Take 1 tablet (10 mg total) by mouth every 6 (six) hours as needed (Nausea or vomiting). 30 tablet 1  . traMADol (ULTRAM) 50 MG tablet Take 1-2 tablets (50-100 mg total) by mouth every 6 (six) hours as needed for moderate pain. 20 tablet 0  . valACYclovir (VALTREX) 500 MG tablet Take 1 tablet (500 mg total) by mouth 2 (two) times daily. 60 tablet 0   No current facility-administered medications for this visit.    Facility-Administered Medications Ordered in Other Visits  Medication Dose Route Frequency Provider Last Rate Last Dose  . CARBOplatin (PARAPLATIN) 240 mg in sodium chloride 0.9 % 100 mL chemo infusion  240 mg Intravenous Once Marquiz Sotelo, Brittany Dad, MD      . diphenhydrAMINE (BENADRYL) injection 25 mg  25 mg Intravenous Once Abcde Oneil, Brittany Dad, MD      . famotidine (PEPCID) IVPB 20 mg premix  20 mg Intravenous Once Charlie Seda, Brittany Dad, MD      . fosaprepitant (EMEND) 150 mg, dexamethasone (DECADRON) 12 mg in sodium chloride 0.9 % 145 mL IVPB   Intravenous Once Ysidro Ramsay, Brittany Dad, MD      . heparin lock flush 100 unit/mL  500 Units Intracatheter Once PRN Hazen Brumett, Brittany Dad, MD      . PACLitaxel (TAXOL) 144 mg in sodium chloride 0.9 % 250 mL chemo infusion (</= 86m/m2)  80 mg/m2 (Treatment Plan Recorded) Intravenous Once Jonette Wassel, GVirgie Dad MD      . palonosetron (ALOXI) injection 0.25 mg  0.25 mg Intravenous Once Debbie Bellucci, GVirgie Dad MD      . sodium chloride flush (NS) 0.9 % injection 10 mL  10 mL Intracatheter PRN Joury Allcorn, GVirgie Dad MD         OBJECTIVE: Young African-American woman who appears stated age  Vitals:   01/28/19 0919  BP: (!) 142/81  Pulse: 94  Resp: 18  Temp: 98.5 F (36.9 C)  SpO2: 99%     Body mass index is 30.89 kg/m.   Wt Readings from Last 3 Encounters:  01/28/19 168 lb 14.4 oz (76.6 kg)  01/07/19 165 lb 12.8 oz (75.2 kg)  12/31/18 167 lb 9.6 oz (76 kg)   ECOG FS:2 - Symptomatic, <50% confined to  bed  Sclerae unicteric, pupils round and equal Wearing a mask No cervical or supraclavicular adenopathy Lungs no rales or rhonchi Heart regular rate and rhythm Abd soft, nontender, positive bowel sounds MSK no focal spinal tenderness, no upper extremity lymphedema Neuro: nonfocal, well oriented, appropriate affect Breasts: The right breast is still a bit larger than the left, and the central mass is still palpable.  It is movable.  In the lower outer quadrant, at the site of her prior  biopsy, there appears to be a biopsy site recurrence.  The left breast is benign.  Both axillae are benign.    LAB RESULTS:  CMP     Component Value Date/Time   NA 143 01/28/2019 0842   K 3.6 01/28/2019 0842   CL 104 01/28/2019 0842   CO2 29 01/28/2019 0842   GLUCOSE 108 (H) 01/28/2019 0842   BUN 11 01/28/2019 0842   CREATININE 0.74 01/28/2019 0842   CREATININE 0.86 11/06/2018 0821   CALCIUM 9.4 01/28/2019 0842   PROT 6.7 01/28/2019 0842   ALBUMIN 3.6 01/28/2019 0842   AST 10 (L) 01/28/2019 0842   AST 11 (L) 11/06/2018 0821   ALT 22 01/28/2019 0842   ALT 25 11/06/2018 0821   ALKPHOS 115 01/28/2019 0842   BILITOT 0.3 01/28/2019 0842   BILITOT 0.5 11/06/2018 0821   GFRNONAA >60 01/28/2019 0842   GFRNONAA >60 11/06/2018 0821   GFRAA >60 01/28/2019 0842   GFRAA >60 11/06/2018 0821    No results found for: TOTALPROTELP, ALBUMINELP, A1GS, A2GS, BETS, BETA2SER, GAMS, MSPIKE, SPEI  No results found for: KPAFRELGTCHN, LAMBDASER, KAPLAMBRATIO  Lab Results  Component Value Date   WBC 13.2 (H) 01/28/2019   NEUTROABS 10.0 (H) 01/28/2019   HGB 9.9 (L) 01/28/2019   HCT 31.7 (L) 01/28/2019   MCV 96.1 01/28/2019   PLT 483 (H) 01/28/2019    '@LASTCHEMISTRY'$ @  No results found for: LABCA2  No components found for: [id number]  No results for input(s): INR in the last 168 hours.  No results found for: LABCA2  No results found for: TDD220  No results found for: URK270  No results found for:  WCB762  No results found for: CA2729  No components found for: HGQUANT  No results found for: CEA1 / No results found for: CEA1   No results found for: AFPTUMOR  No results found for: CHROMOGRNA  No results found for: PSA1  Appointment on 01/28/2019  Component Date Value Ref Range Status  . WBC 01/28/2019 13.2* 4.0 - 10.5 K/uL Final  . RBC 01/28/2019 3.30* 3.87 - 5.11 MIL/uL Final  . Hemoglobin 01/28/2019 9.9* 12.0 - 15.0 g/dL Final  . HCT 01/28/2019 31.7* 36.0 - 46.0 % Final  . MCV 01/28/2019 96.1  80.0 - 100.0 fL Final  . MCH 01/28/2019 30.0  26.0 - 34.0 pg Final  . MCHC 01/28/2019 31.2  30.0 - 36.0 g/dL Final  . RDW 01/28/2019 19.3* 11.5 - 15.5 % Final  . Platelets 01/28/2019 483* 150 - 400 K/uL Final  . nRBC 01/28/2019 0.8* 0.0 - 0.2 % Final  . Neutrophils Relative % 01/28/2019 76  % Final  . Neutro Abs 01/28/2019 10.0* 1.7 - 7.7 K/uL Final  . Lymphocytes Relative 01/28/2019 11  % Final  . Lymphs Abs 01/28/2019 1.5  0.7 - 4.0 K/uL Final  . Monocytes Relative 01/28/2019 9  % Final  . Monocytes Absolute 01/28/2019 1.2* 0.1 - 1.0 K/uL Final  . Eosinophils Relative 01/28/2019 1  % Final  . Eosinophils Absolute 01/28/2019 0.2  0.0 - 0.5 K/uL Final  . Basophils Relative 01/28/2019 1  % Final  . Basophils Absolute 01/28/2019 0.1  0.0 - 0.1 K/uL Final  . Immature Granulocytes 01/28/2019 2  % Final  . Abs Immature Granulocytes 01/28/2019 0.31* 0.00 - 0.07 K/uL Final   Performed at [facility], [place] [address], [place], [place] [address]  . Sodium 01/28/2019 143  135 - 145 mmol/L Final  . Potassium 01/28/2019  3.6  3.5 - 5.1 mmol/L Final  . Chloride 01/28/2019 104  98 - 111 mmol/L Final  . CO2 01/28/2019 29  22 - 32 mmol/L Final  . Glucose, Bld 01/28/2019 108* 70 - 99 mg/dL Final  . BUN 01/28/2019 11  6 - 20 mg/dL Final  . Creatinine, Ser 01/28/2019 0.74  0.44 - 1.00 mg/dL Final  . Calcium 01/28/2019 9.4  8.9 - 10.3 mg/dL Final  . Total Protein  01/28/2019 6.7  6.5 - 8.1 g/dL Final  . Albumin 01/28/2019 3.6  3.5 - 5.0 g/dL Final  . AST 01/28/2019 10* 15 - 41 U/L Final  . ALT 01/28/2019 22  0 - 44 U/L Final  . Alkaline Phosphatase 01/28/2019 115  38 - 126 U/L Final  . Total Bilirubin 01/28/2019 0.3  0.3 - 1.2 mg/dL Final  . GFR calc non Af Amer 01/28/2019 >60  >60 mL/min Final  . GFR calc Af Amer 01/28/2019 >60  >60 mL/min Final  . Anion gap 01/28/2019 10  5 - 15 Final   Performed at [facility], [address] [place], [place] [address]    (this displays the last labs from the last 3 days)  No results found for: TOTALPROTELP, ALBUMINELP, A1GS, A2GS, BETS, BETA2SER, GAMS, MSPIKE, SPEI (this displays SPEP labs)  No results found for: KPAFRELGTCHN, LAMBDASER, KAPLAMBRATIO (kappa/lambda light chains)  No results found for: HGBA, HGBA2QUANT, HGBFQUANT, HGBSQUAN (Hemoglobinopathy evaluation)   No results found for: LDH  No results found for: IRON, TIBC, IRONPCTSAT (Iron and TIBC)  No results found for: FERRITIN  Urinalysis    Component Value Date/Time   COLORURINE STRAW (A) 12/06/2018 1014   APPEARANCEUR CLEAR 12/06/2018 1014   LABSPEC 1.009 12/06/2018 1014   PHURINE 7.0 12/06/2018 1014   GLUCOSEU NEGATIVE 12/06/2018 1014   HGBUR SMALL (A) 12/06/2018 1014   BILIRUBINUR NEGATIVE 12/06/2018 1014   BILIRUBINUR negative 05/31/2018 1011   KETONESUR NEGATIVE 12/06/2018 1014   PROTEINUR NEGATIVE 12/06/2018 1014   UROBILINOGEN 0.2 05/31/2018 1011   UROBILINOGEN 0.2 05/18/2010 1042   NITRITE NEGATIVE 12/06/2018 1014   LEUKOCYTESUR NEGATIVE 12/06/2018 1014     STUDIES:  No results found.   ELIGIBLE FOR AVAILABLE RESEARCH PROTOCOL: Declined   ASSESSMENT: 48 y.o. Brittany Rodgers, [place] woman status post right breast lower outer quadrant biopsy 10/29/2018 for a clinical T2 N0, stage IIB invasive ductal carcinoma, grade 3, triple negative, with an MIB-1 of 80%  (a) CT of the chest 11/15/2018 shows  possible liver metastases, but liver biopsy 11/25/2018 consistent with hemangioma  (b) bone scan 11/15/2018 shows no suspicious bone lesions  (1) neoadjuvant chemotherapy consisting of doxorubicin and cyclophosphamide in dose dense fashion x4, started 11/26/2018, completed 01/07/2019, to be followed by weekly paclitaxel and carboplatin x12 starting 01/28/2019  (2) patient desires ovarian function preservation: Zoladex started 11/08/2018  (3) definitive surgery to follow chemo  (4) adjuvant radiation to follow  (5) genetics counseling on 11/28/2018, testing was recommended, but declined by patient.   PLAN: I am concerned that Brittany Rodgers's right breast cancer may be growing despite the chemotherapy.  This happens about 5% of the time.  It is not a good prognostic sign if it is the case.  I am setting her up for ultrasound of the right breast and axilla within the next 2 days if possible.  I have also alerted her surgeon.   We are proceeding with carboplatin and paclitaxel today.  This is of course two totally different agents and even  if the cyclophosphamide and doxorubicin did not work these could possibly work.  I am going to see her again next week not only to see how she tolerated these agents but to review the ultrasound results and make a decision whether to continue with the current chemo or move on with surgery and do chemotherapy adjuvantly.  She is taking appropriate pandemic precautions  She knows to call for any other issue that may develop before the next visit here.   Stanislaus Kaltenbach, Brittany Dad, MD  01/28/19 10:15 AM Medical Oncology and Hematology [facility] [address] [place], [place] [address] Tel. [phone]    Fax. [phone]   I, Wilburn Mylar, am acting as scribe for Dr. Virgie Rodgers. Jeanette Moffatt.  I, Lurline Del MD, have reviewed the above documentation for accuracy and completeness, and I agree with the above.

## 2019-01-28 ENCOUNTER — Other Ambulatory Visit: Payer: Self-pay | Admitting: Oncology

## 2019-01-28 ENCOUNTER — Telehealth: Payer: Self-pay | Admitting: *Deleted

## 2019-01-28 ENCOUNTER — Inpatient Hospital Stay: Payer: BC Managed Care – PPO

## 2019-01-28 ENCOUNTER — Other Ambulatory Visit: Payer: Self-pay

## 2019-01-28 ENCOUNTER — Inpatient Hospital Stay: Payer: BC Managed Care – PPO | Attending: Adult Health

## 2019-01-28 ENCOUNTER — Inpatient Hospital Stay (HOSPITAL_BASED_OUTPATIENT_CLINIC_OR_DEPARTMENT_OTHER): Payer: BC Managed Care – PPO | Admitting: Oncology

## 2019-01-28 VITALS — BP 142/81 | HR 94 | Temp 98.5°F | Resp 18 | Ht 62.0 in | Wt 168.9 lb

## 2019-01-28 VITALS — BP 157/87 | HR 100 | Temp 98.8°F | Resp 18

## 2019-01-28 DIAGNOSIS — Z5111 Encounter for antineoplastic chemotherapy: Secondary | ICD-10-CM | POA: Insufficient documentation

## 2019-01-28 DIAGNOSIS — R51 Headache: Secondary | ICD-10-CM

## 2019-01-28 DIAGNOSIS — C50511 Malignant neoplasm of lower-outer quadrant of right female breast: Secondary | ICD-10-CM

## 2019-01-28 DIAGNOSIS — Z171 Estrogen receptor negative status [ER-]: Secondary | ICD-10-CM

## 2019-01-28 DIAGNOSIS — G4700 Insomnia, unspecified: Secondary | ICD-10-CM | POA: Diagnosis not present

## 2019-01-28 DIAGNOSIS — Z95828 Presence of other vascular implants and grafts: Secondary | ICD-10-CM

## 2019-01-28 LAB — CBC WITH DIFFERENTIAL/PLATELET
Abs Immature Granulocytes: 0.31 10*3/uL — ABNORMAL HIGH (ref 0.00–0.07)
Basophils Absolute: 0.1 10*3/uL (ref 0.0–0.1)
Basophils Relative: 1 %
Eosinophils Absolute: 0.2 10*3/uL (ref 0.0–0.5)
Eosinophils Relative: 1 %
HCT: 31.7 % — ABNORMAL LOW (ref 36.0–46.0)
Hemoglobin: 9.9 g/dL — ABNORMAL LOW (ref 12.0–15.0)
Immature Granulocytes: 2 %
Lymphocytes Relative: 11 %
Lymphs Abs: 1.5 10*3/uL (ref 0.7–4.0)
MCH: 30 pg (ref 26.0–34.0)
MCHC: 31.2 g/dL (ref 30.0–36.0)
MCV: 96.1 fL (ref 80.0–100.0)
Monocytes Absolute: 1.2 10*3/uL — ABNORMAL HIGH (ref 0.1–1.0)
Monocytes Relative: 9 %
Neutro Abs: 10 10*3/uL — ABNORMAL HIGH (ref 1.7–7.7)
Neutrophils Relative %: 76 %
Platelets: 483 10*3/uL — ABNORMAL HIGH (ref 150–400)
RBC: 3.3 MIL/uL — ABNORMAL LOW (ref 3.87–5.11)
RDW: 19.3 % — ABNORMAL HIGH (ref 11.5–15.5)
WBC: 13.2 10*3/uL — ABNORMAL HIGH (ref 4.0–10.5)
nRBC: 0.8 % — ABNORMAL HIGH (ref 0.0–0.2)

## 2019-01-28 LAB — COMPREHENSIVE METABOLIC PANEL
ALT: 22 U/L (ref 0–44)
AST: 10 U/L — ABNORMAL LOW (ref 15–41)
Albumin: 3.6 g/dL (ref 3.5–5.0)
Alkaline Phosphatase: 115 U/L (ref 38–126)
Anion gap: 10 (ref 5–15)
BUN: 11 mg/dL (ref 6–20)
CO2: 29 mmol/L (ref 22–32)
Calcium: 9.4 mg/dL (ref 8.9–10.3)
Chloride: 104 mmol/L (ref 98–111)
Creatinine, Ser: 0.74 mg/dL (ref 0.44–1.00)
GFR calc Af Amer: >60 mL/min (ref >60)
GFR calc non Af Amer: >60 mL/min (ref >60)
Glucose, Bld: 108 mg/dL — ABNORMAL HIGH (ref 70–99)
Potassium: 3.6 mmol/L (ref 3.5–5.1)
Sodium: 143 mmol/L (ref 135–145)
Total Bilirubin: 0.3 mg/dL (ref 0.3–1.2)
Total Protein: 6.7 g/dL (ref 6.5–8.1)

## 2019-01-28 MED ORDER — DIPHENHYDRAMINE HCL 50 MG/ML IJ SOLN
INTRAMUSCULAR | Status: AC
  Filled 2019-01-28: qty 1

## 2019-01-28 MED ORDER — SODIUM CHLORIDE 0.9 % IV SOLN
Freq: Once | INTRAVENOUS | Status: AC
  Administered 2019-01-28: 10:00:00 via INTRAVENOUS
  Filled 2019-01-28: qty 250

## 2019-01-28 MED ORDER — SODIUM CHLORIDE 0.9% FLUSH
10.0000 mL | Freq: Once | INTRAVENOUS | Status: AC
  Administered 2019-01-28: 10 mL
  Filled 2019-01-28: qty 10

## 2019-01-28 MED ORDER — SODIUM CHLORIDE 0.9 % IV SOLN
240.0000 mg | Freq: Once | INTRAVENOUS | Status: AC
  Administered 2019-01-28: 14:00:00 240 mg via INTRAVENOUS
  Filled 2019-01-28: qty 24

## 2019-01-28 MED ORDER — DIPHENHYDRAMINE HCL 50 MG/ML IJ SOLN
25.0000 mg | Freq: Once | INTRAMUSCULAR | Status: AC
  Administered 2019-01-28: 10:00:00 25 mg via INTRAVENOUS

## 2019-01-28 MED ORDER — SODIUM CHLORIDE 0.9% FLUSH
10.0000 mL | INTRAVENOUS | Status: DC | PRN
  Administered 2019-01-28: 14:00:00 10 mL
  Filled 2019-01-28: qty 10

## 2019-01-28 MED ORDER — SODIUM CHLORIDE 0.9 % IV SOLN
80.0000 mg/m2 | Freq: Once | INTRAVENOUS | Status: AC
  Administered 2019-01-28: 12:00:00 144 mg via INTRAVENOUS
  Filled 2019-01-28: qty 24

## 2019-01-28 MED ORDER — HEPARIN SOD (PORK) LOCK FLUSH 100 UNIT/ML IV SOLN
500.0000 [IU] | Freq: Once | INTRAVENOUS | Status: AC | PRN
  Administered 2019-01-28: 500 [IU]
  Filled 2019-01-28: qty 5

## 2019-01-28 MED ORDER — FAMOTIDINE IN NACL 20-0.9 MG/50ML-% IV SOLN
20.0000 mg | Freq: Once | INTRAVENOUS | Status: AC
  Administered 2019-01-28: 10:00:00 20 mg via INTRAVENOUS

## 2019-01-28 MED ORDER — SODIUM CHLORIDE 0.9 % IV SOLN
Freq: Once | INTRAVENOUS | Status: AC
  Administered 2019-01-28: 11:00:00 via INTRAVENOUS
  Filled 2019-01-28: qty 5

## 2019-01-28 MED ORDER — PALONOSETRON HCL INJECTION 0.25 MG/5ML
INTRAVENOUS | Status: AC
  Filled 2019-01-28: qty 5

## 2019-01-28 MED ORDER — PALONOSETRON HCL INJECTION 0.25 MG/5ML
0.2500 mg | Freq: Once | INTRAVENOUS | Status: AC
  Administered 2019-01-28: 10:00:00 0.25 mg via INTRAVENOUS

## 2019-01-28 MED ORDER — FAMOTIDINE IN NACL 20-0.9 MG/50ML-% IV SOLN
INTRAVENOUS | Status: AC
  Filled 2019-01-28: qty 50

## 2019-01-28 NOTE — Patient Instructions (Signed)
[facility] Discharge Instructions for Patients Receiving Chemotherapy  Today you received the following chemotherapy agents: Taxol and Carboplatin   To help prevent nausea and vomiting after your treatment, we encourage you to take your nausea medication as prescribed by your physician.   If you develop nausea and vomiting that is not controlled by your nausea medication, call the clinic.   BELOW ARE SYMPTOMS THAT SHOULD BE REPORTED IMMEDIATELY:  *FEVER GREATER THAN 100.5 F  *CHILLS WITH OR WITHOUT FEVER  NAUSEA AND VOMITING THAT IS NOT CONTROLLED WITH YOUR NAUSEA MEDICATION  *UNUSUAL SHORTNESS OF BREATH  *UNUSUAL BRUISING OR BLEEDING  TENDERNESS IN MOUTH AND THROAT WITH OR WITHOUT PRESENCE OF ULCERS  *URINARY PROBLEMS  *BOWEL PROBLEMS  UNUSUAL RASH Items with * indicate a potential emergency and should be followed up as soon as possible.  Feel free to call the clinic should you have any questions or concerns. The clinic phone number is [phone].  Please show the [facility] at check-in to the Emergency Department and triage nurse.  Paclitaxel injection(Taxol) What is this medicine? PACLITAXEL (PAK li TAX el) is a chemotherapy drug. It targets fast dividing cells, like cancer cells, and causes these cells to die. This medicine is used to treat ovarian cancer, breast cancer, lung cancer, Kaposi's sarcoma, and other cancers. This medicine may be used for other purposes; ask your health care provider or pharmacist if you have questions. COMMON BRAND NAME(S): Onxol, Taxol What should I tell my health care provider before I take this medicine? They need to know if you have any of these conditions: -history of irregular heartbeat -liver disease -low blood counts, like low white cell, platelet, or red cell counts -lung or breathing disease, like asthma -tingling of the fingers or toes, or other nerve disorder -an unusual or allergic reaction to  paclitaxel, alcohol, polyoxyethylated castor oil, other chemotherapy, other medicines, foods, dyes, or preservatives -pregnant or trying to get pregnant -breast-feeding How should I use this medicine? This drug is given as an infusion into a vein. It is administered in a hospital or clinic by a specially trained health care professional. Talk to your pediatrician regarding the use of this medicine in children. Special care may be needed. Overdosage: If you think you have taken too much of this medicine contact a poison control center or emergency room at once. NOTE: This medicine is only for you. Do not share this medicine with others. What if I miss a dose? It is important not to miss your dose. Call your doctor or health care professional if you are unable to keep an appointment. What may interact with this medicine? Do not take this medicine with any of the following medications: -disulfiram -metronidazole This medicine may also interact with the following medications: -antiviral medicines for hepatitis, HIV or AIDS -certain antibiotics like erythromycin and clarithromycin -certain medicines for fungal infections like ketoconazole and itraconazole -certain medicines for seizures like carbamazepine, phenobarbital, phenytoin -gemfibrozil -nefazodone -rifampin -St. John's wort This list may not describe all possible interactions. Give your health care provider a list of all the medicines, herbs, non-prescription drugs, or dietary supplements you use. Also tell them if you smoke, drink alcohol, or use illegal drugs. Some items may interact with your medicine. What should I watch for while using this medicine? Your condition will be monitored carefully while you are receiving this medicine. You will need important blood work done while you are taking this medicine. This medicine can cause serious  allergic reactions. To reduce your risk you will need to take other medicine(s) before treatment  with this medicine. If you experience allergic reactions like skin rash, itching or hives, swelling of the face, lips, or tongue, tell your doctor or health care professional right away. In some cases, you may be given additional medicines to help with side effects. Follow all directions for their use. This drug may make you feel generally unwell. This is not uncommon, as chemotherapy can affect healthy cells as well as cancer cells. Report any side effects. Continue your course of treatment even though you feel ill unless your doctor tells you to stop. Call your doctor or health care professional for advice if you get a fever, chills or sore throat, or other symptoms of a cold or flu. Do not treat yourself. This drug decreases your body's ability to fight infections. Try to avoid being around people who are sick. This medicine may increase your risk to bruise or bleed. Call your doctor or health care professional if you notice any unusual bleeding. Be careful brushing and flossing your teeth or using a toothpick because you may get an infection or bleed more easily. If you have any dental work done, tell your dentist you are receiving this medicine. Avoid taking products that contain aspirin, acetaminophen, ibuprofen, naproxen, or ketoprofen unless instructed by your doctor. These medicines may hide a fever. Do not become pregnant while taking this medicine. Women should inform their doctor if they wish to become pregnant or think they might be pregnant. There is a potential for serious side effects to an unborn child. Talk to your health care professional or pharmacist for more information. Do not breast-feed an infant while taking this medicine. Men are advised not to father a child while receiving this medicine. This product may contain alcohol. Ask your pharmacist or healthcare provider if this medicine contains alcohol. Be sure to tell all healthcare providers you are taking this medicine. Certain  medicines, like metronidazole and disulfiram, can cause an unpleasant reaction when taken with alcohol. The reaction includes flushing, headache, nausea, vomiting, sweating, and increased thirst. The reaction can last from 30 minutes to several hours. What side effects may I notice from receiving this medicine? Side effects that you should report to your doctor or health care professional as soon as possible: -allergic reactions like skin rash, itching or hives, swelling of the face, lips, or tongue -breathing problems -changes in vision -fast, irregular heartbeat -high or low blood pressure -mouth sores -pain, tingling, numbness in the hands or feet -signs of decreased platelets or bleeding - bruising, pinpoint red spots on the skin, black, tarry stools, blood in the urine -signs of decreased red blood cells - unusually weak or tired, feeling faint or lightheaded, falls -signs of infection - fever or chills, cough, sore throat, pain or difficulty passing urine -signs and symptoms of liver injury like dark yellow or brown urine; general ill feeling or flu-like symptoms; light-colored stools; loss of appetite; nausea; right upper belly pain; unusually weak or tired; yellowing of the eyes or skin -swelling of the ankles, feet, hands -unusually slow heartbeat Side effects that usually do not require medical attention (report to your doctor or health care professional if they continue or are bothersome): -diarrhea -hair loss -loss of appetite -muscle or joint pain -nausea, vomiting -pain, redness, or irritation at site where injected -tiredness This list may not describe all possible side effects. Call your doctor for medical advice about side effects. You  may report side effects to FDA at 1-800-FDA-1088. Where should I keep my medicine? This drug is given in a hospital or clinic and will not be stored at home. NOTE: This sheet is a summary. It may not cover all possible information. If you  have questions about this medicine, talk to your doctor, pharmacist, or health care provider.  2019 Elsevier/Gold Standard (2017-05-15 13:14:55)   Carboplatin injection What is this medicine? CARBOPLATIN (KAR boe pla tin) is a chemotherapy drug. It targets fast dividing cells, like cancer cells, and causes these cells to die. This medicine is used to treat ovarian cancer and many other cancers. This medicine may be used for other purposes; ask your health care provider or pharmacist if you have questions. COMMON BRAND NAME(S): Paraplatin What should I tell my health care provider before I take this medicine? They need to know if you have any of these conditions: -blood disorders -hearing problems -kidney disease -recent or ongoing radiation therapy -an unusual or allergic reaction to carboplatin, cisplatin, other chemotherapy, other medicines, foods, dyes, or preservatives -pregnant or trying to get pregnant -breast-feeding How should I use this medicine? This drug is usually given as an infusion into a vein. It is administered in a hospital or clinic by a specially trained health care professional. Talk to your pediatrician regarding the use of this medicine in children. Special care may be needed. Overdosage: If you think you have taken too much of this medicine contact a poison control center or emergency room at once. NOTE: This medicine is only for you. Do not share this medicine with others. What if I miss a dose? It is important not to miss a dose. Call your doctor or health care professional if you are unable to keep an appointment. What may interact with this medicine? -medicines for seizures -medicines to increase blood counts like filgrastim, pegfilgrastim, sargramostim -some antibiotics like amikacin, gentamicin, neomycin, streptomycin, tobramycin -vaccines Talk to your doctor or health care professional before taking any of these  medicines: -acetaminophen -aspirin -ibuprofen -ketoprofen -naproxen This list may not describe all possible interactions. Give your health care provider a list of all the medicines, herbs, non-prescription drugs, or dietary supplements you use. Also tell them if you smoke, drink alcohol, or use illegal drugs. Some items may interact with your medicine. What should I watch for while using this medicine? Your condition will be monitored carefully while you are receiving this medicine. You will need important blood work done while you are taking this medicine. This drug may make you feel generally unwell. This is not uncommon, as chemotherapy can affect healthy cells as well as cancer cells. Report any side effects. Continue your course of treatment even though you feel ill unless your doctor tells you to stop. In some cases, you may be given additional medicines to help with side effects. Follow all directions for their use. Call your doctor or health care professional for advice if you get a fever, chills or sore throat, or other symptoms of a cold or flu. Do not treat yourself. This drug decreases your body's ability to fight infections. Try to avoid being around people who are sick. This medicine may increase your risk to bruise or bleed. Call your doctor or health care professional if you notice any unusual bleeding. Be careful brushing and flossing your teeth or using a toothpick because you may get an infection or bleed more easily. If you have any dental work done, tell your dentist you are receiving  this medicine. Avoid taking products that contain aspirin, acetaminophen, ibuprofen, naproxen, or ketoprofen unless instructed by your doctor. These medicines may hide a fever. Do not become pregnant while taking this medicine. Women should inform their doctor if they wish to become pregnant or think they might be pregnant. There is a potential for serious side effects to an unborn child. Talk to  your health care professional or pharmacist for more information. Do not breast-feed an infant while taking this medicine. What side effects may I notice from receiving this medicine? Side effects that you should report to your doctor or health care professional as soon as possible: -allergic reactions like skin rash, itching or hives, swelling of the face, lips, or tongue -signs of infection - fever or chills, cough, sore throat, pain or difficulty passing urine -signs of decreased platelets or bleeding - bruising, pinpoint red spots on the skin, black, tarry stools, nosebleeds -signs of decreased red blood cells - unusually weak or tired, fainting spells, lightheadedness -breathing problems -changes in hearing -changes in vision -chest pain -high blood pressure -low blood counts - This drug may decrease the number of white blood cells, red blood cells and platelets. You may be at increased risk for infections and bleeding. -nausea and vomiting -pain, swelling, redness or irritation at the injection site -pain, tingling, numbness in the hands or feet -problems with balance, talking, walking -trouble passing urine or change in the amount of urine Side effects that usually do not require medical attention (report to your doctor or health care professional if they continue or are bothersome): -hair loss -loss of appetite -metallic taste in the mouth or changes in taste This list may not describe all possible side effects. Call your doctor for medical advice about side effects. You may report side effects to FDA at 1-800-FDA-1088. Where should I keep my medicine? This drug is given in a hospital or clinic and will not be stored at home. NOTE: This sheet is a summary. It may not cover all possible information. If you have questions about this medicine, talk to your doctor, pharmacist, or health care provider.  2019 Elsevier/Gold Standard (2007-12-17 14:38:05)   Coronavirus (COVID-19) Are  you at risk?  Are you at risk for the Coronavirus (COVID-19)?  To be considered HIGH RISK for Coronavirus (COVID-19), you have to meet the following criteria:  . Traveled to [place], [place], [place], [place] or [place]; or in the [place] to [place], [place], [place], or [place]; and have fever, cough, and shortness of breath within the last 2 weeks of travel OR . Been in close contact with a person diagnosed with COVID-19 within the last 2 weeks and have fever, cough, and shortness of breath . IF YOU DO NOT MEET THESE CRITERIA, YOU ARE CONSIDERED LOW RISK FOR COVID-19.  What to do if you are HIGH RISK for COVID-19?  Marland Kitchen If you are having a medical emergency, call 911. . Seek medical care right away. Before you go to a doctor's office, urgent care or emergency department, call ahead and tell them about your recent travel, contact with someone diagnosed with COVID-19, and your symptoms. You should receive instructions from your physician's office regarding next steps of care.  . When you arrive at healthcare provider, tell the healthcare staff immediately you have returned from visiting [place], [place], [place], [place] or [place]; or traveled in the [place] to [place], [place], [place], or [place]; in the last two weeks or  you have been in close contact with a person diagnosed with COVID-19 in the last 2 weeks.   . Tell the health care staff about your symptoms: fever, cough and shortness of breath. . After you have been seen by a medical provider, you will be either: o Tested for (COVID-19) and discharged home on quarantine except to seek medical care if symptoms worsen, and asked to  - Stay home and avoid contact with others until you get your results (4-5 days)  - Avoid travel on public transportation if possible (such as bus, train, or airplane) or o Sent to the Emergency Department by EMS for evaluation, COVID-19 testing, and possible admission depending on your  condition and test results.  What to do if you are LOW RISK for COVID-19?  Reduce your risk of any infection by using the same precautions used for avoiding the common cold or flu:  Marland Kitchen Wash your hands often with soap and warm water for at least 20 seconds.  If soap and water are not readily available, use an alcohol-based hand sanitizer with at least 60% alcohol.  . If coughing or sneezing, cover your mouth and nose by coughing or sneezing into the elbow areas of your shirt or coat, into a tissue or into your sleeve (not your hands). . Avoid shaking hands with others and consider head nods or verbal greetings only. . Avoid touching your eyes, nose, or mouth with unwashed hands.  . Avoid close contact with people who are sick. . Avoid places or events with large numbers of people in one location, like concerts or sporting events. . Carefully consider travel plans you have or are making. . If you are planning any travel outside or inside the [name], visit the CDC's Travelers' Health webpage for the latest health notices. . If you have some symptoms but not all symptoms, continue to monitor at home and seek medical attention if your symptoms worsen. . If you are having a medical emergency, call 911.   [place] / e-Visit: [email or web address]         [facility]: [facility]: [phone]                   [facility]: [phone]

## 2019-01-28 NOTE — Progress Notes (Signed)
I s/w [name] over phone.  He would like pt to get Emend today w/ Dex as ordered (pt feeling "pretty crummy"). After today's dose, MD requested Emend be deleted from her plan since Carbo AUC = 2. Antiemetic premeds updated. Kennith Center, Pharm.D., CPP 01/28/2019@10 :21 AM

## 2019-01-28 NOTE — Telephone Encounter (Signed)
Left message for patient to return my phone call concerning the scheduling of her MRI breast.

## 2019-01-29 ENCOUNTER — Telehealth: Payer: Self-pay

## 2019-01-29 ENCOUNTER — Telehealth: Payer: Self-pay | Admitting: *Deleted

## 2019-01-29 NOTE — Telephone Encounter (Signed)
Left message for patient to return call.  Re: Follow up first Taxol and Carboplatin treatment.

## 2019-01-29 NOTE — Telephone Encounter (Signed)
Called Brittany Rodgers for chemotherapy F/U.  Patient is doing well.   "Better with this than the first one.  Only last night wasn't good for me.  Called the nurse after hours for pain in breast.  Took Tylenol and Aleve last night and when I woke up at 10:00 am.  This worked well. workedDid not want to take the Tramadol.  This constipates me and I already have a problem with this.  Senokot-S and [name] on hand if needed.  No n/v.  None of the new side effects or symptoms I read about .  Bowel and bladder functioning well.  Eating and drinking well using 16 oz bottle waters."  Instructed to drink 64 oz minimum daily or at least the day before, of and after treatment.   Denies questions or needs at this time.  Encouraged to call [phone] Mon -Fri 8:00 am - 4:30 pm or anytime as needed for symptoms, changes or event outside office hours.

## 2019-01-30 ENCOUNTER — Telehealth: Payer: Self-pay | Admitting: *Deleted

## 2019-01-30 NOTE — Telephone Encounter (Signed)
Left message for a return phone call to make sure patient is aware of her appointment for MRI tomorrow 5/8.

## 2019-01-31 ENCOUNTER — Other Ambulatory Visit: Payer: Self-pay

## 2019-01-31 ENCOUNTER — Other Ambulatory Visit: Payer: Self-pay | Admitting: Oncology

## 2019-01-31 ENCOUNTER — Ambulatory Visit
Admission: RE | Admit: 2019-01-31 | Discharge: 2019-01-31 | Disposition: A | Discharge status: Home / Self Care | Payer: BLUE CROSS/BLUE SHIELD | Source: Ambulatory Visit | Attending: Oncology | Admitting: Oncology

## 2019-01-31 DIAGNOSIS — Z171 Estrogen receptor negative status [ER-]: Secondary | ICD-10-CM

## 2019-01-31 DIAGNOSIS — C50511 Malignant neoplasm of lower-outer quadrant of right female breast: Secondary | ICD-10-CM

## 2019-01-31 MED ORDER — GADOBUTROL 1 MMOL/ML IV SOLN
8.0000 mL | Freq: Once | INTRAVENOUS | Status: AC | PRN
  Administered 2019-01-31: 8 mL via INTRAVENOUS

## 2019-02-03 NOTE — Progress Notes (Signed)
[facility]  Telephone:([phone]) [phone] Fax:([phone]) [phone]    ID: Brittany Rodgers DOB: [birth year]  MR#: [id number]  TMH#:[id number]  Patient Care Team: Brittany Morale, Rodgers as PCP - General Brittany Bookbinder, Rodgers as Consulting Physician (General Surgery) Brittany Rodgers as Consulting Physician (Oncology) Brittany Gibson, Rodgers as Attending Physician (Radiation Oncology) Brittany Mayer, Rodgers as Consulting Physician (Gastroenterology) Brittany Millers, Rodgers as Consulting Physician (Obstetrics and Gynecology) Brittany Germany, RN as Oncology Nurse Navigator Brittany Rodgers, Brittany Blanch, RN as Oncology Nurse Navigator OTHER Rodgers: Brittany Rodgers (urology)    CHIEF COMPLAINT: Triple negative breast cancer  CURRENT TREATMENT: Neoadjuvant chemotherapy, goserelin   INTERVAL HISTORY: Brittany Rodgers returns today for follow-up and treatment of her triple negative breast cancer.  She continues on weekly paclitaxel and carboplatin x12. Today is day 1 cycle 2. She reports abdominal cramping, severe fatigue but also feeling wired, watering eyes, muscle cramps in her legs including leg jerks that wake her up, and darkening of the skin and nails. She denies mouth sores.  Her most recent echocardiogram was on 11/13/2018 and showed a ejection fraction of 60-65%.  Most recent goserelin dose was 01/07/2019. She reports hot flashes and diminished sex drive.  Since her last visit here, she underwent bilateral breast MRI on 01/31/2019. Results revealed: breast composition B; enlarging biopsy-proven right breast malignancy, now measuring 10 cm (previously 5.4 cm) and now extends to the upper-outer right breast skin, and continued contact with right pectoralis muscle; unchanged 7 mm right internal mammary lymph node, previously identified prominent right axillary lymph nodes now have normal appearance; 1.2 cm linear non masslike enhancement within the upper-inner left breast - DCIS not excluded, recommend biopsy if this would  change treatment.   REVIEW OF SYSTEMS: Brittany Rodgers reports doing fair overall. She states feeling like this treatment is working better. The patient denies unusual headaches, visual changes, nausea, vomiting, stiff neck, dizziness, or gait imbalance. There has been no cough, phlegm production, or pleurisy, no chest pain or pressure, and no change in bowel or bladder habits. The patient denies fever, rash, bleeding, unexplained fatigue or unexplained weight loss. A detailed review of systems was otherwise entirely negative.   HISTORY OF CURRENT ILLNESS: From the original intake note:  Brittany Rodgers presented with a palpable right breast lump and associated tenderness for approximately 1 week. She called her doctor immediately following  She underwent unilateral right diagnostic mammography with tomography and right breast ultrasonography at [facility] on 10/28/2018 showing: Breast Density Category C. Radiopaque BB was placed at the site of the patient's palpable lump in the central right breast. An irregular hyperdense mass with associated increased trabecular thickening is seen in the deep central right breast. On physical exam, a firm, fixed mass in the central right breast is palpated. Target ultrasound is performed, showing and irregular hypoechoic mass at the 8 o'clock position 3 cm from the nipple. It measures 3.6 x 2.9 x 2.3 cm. There is associated peripheral vascularity. Evaluation of the right axilla demonstrates a single prominent lymph node demonstrating 0.4 cm cortical thickening. The remaining lymph nodes ave normal appearance with a thin, symmetric cortex.   Accordingly on 10/29/2018 she proceeded to biopsy of the right breast area in question. The pathology from this procedure showed ([id number]): invasive ductal carcinoma, grade III. Prognostic indicators significant for: estrogen receptor, 0% negative and progesterone receptor, 0% negative. Proliferation marker Ki67 at 80%. HER2  negative (1+) by immunohistochemistry.  Biopsy of the right axilla was performed  on the same day. The pathology from this procedure showed ([id number]): lymph node, negative for carcinoma.  This was felt to be concordant  The patient's subsequent history is as detailed below.   PAST MEDICAL HISTORY: Past Medical History:  Diagnosis Date  . Chronic cough   . Family history of breast cancer   . GERD (gastroesophageal reflux disease)   . Hyperlipidemia   . IBS (irritable bowel syndrome)    sees Dr. Silvano Rodgers   . Routine gynecological examination    sees Dr. Freda Rodgers     PAST SURGICAL HISTORY: Past Surgical History:  Procedure Laterality Date  . CESAREAN SECTION    . PORTACATH PLACEMENT Right 11/14/2018   Procedure: INSERTION PORT-A-CATH WITH ULTRASOUND;  Surgeon: Brittany Bookbinder, Rodgers;  Location: [facility];  Service: General;  Laterality: Right;  . ROOT CANAL       FAMILY HISTORY: Family History  Problem Relation Age of Onset  . Hypertension Other   . Breast cancer Other        mat first cousin's daughter  . Breast cancer Maternal Aunt   . Hypertension Mother   . Breast cancer Maternal Aunt   . Breast cancer Maternal Aunt    Brittany's father died from an unknown cause at age 17. [name] died from unknown health complications at age 8. The patient has 4 sisters. Brittany Rodgers has 3 maternal aunts that have been diagnosed with breast cancer. Patient denies anyone in her family having ovarian, prostate, or pancreatic cancer.    GYNECOLOGIC HISTORY:  No LMP recorded. 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 Her husband, Brittany Rodgers, is a Administrator. The patient has two children, Brittany Rodgers and Brittany Rodgers, from another relationship. Brittany Rodgers is 48, lives in [place]. Brittany Rodgers is 74, lives in  [place], and is in [employer].  The patient's husband Brittany Rodgers has a child, Brittany Rodgers, from another relationship; she lives in [place] and is in school. The patient has 5 grandchildren. She attends the [employer].   ADVANCED DIRECTIVES: Brittany's husband, Brittany Rodgers, is automatically her healthcare power of attorney.     HEALTH MAINTENANCE: Social History   Tobacco Use  . Smoking status: Never Smoker  . Smokeless tobacco: Never Used  Substance Use Topics  . Alcohol use: No    Alcohol/week: 0.0 standard drinks  . Drug use: No    Colonoscopy: yes  PAP: 02/2018  Bone density: no   Allergies  Allergen Reactions  . Barbiturates Other (See Comments)    Keeps pt awake  . Sulfonamide Derivatives Itching    Current Outpatient Medications  Medication Sig Dispense Refill  . atorvastatin (LIPITOR) 20 MG tablet Take 1 tablet (20 mg total) by mouth daily. 90 tablet 3  . cyclobenzaprine (FLEXERIL) 10 MG tablet TAKE 1 TABLET BY MOUTH THREE TIMES DAILY AS NEEDED FOR MUSCLE SPASMS 90 tablet 0  . dexamethasone (DECADRON) 4 MG tablet TAKE 2 TABLETS BY MOUTH ONCE DAILY ON THE DAY AFTER CHEMOTHERAPY AND THEN TAKE 2 TABLETS TWICE DAILY FOR 2 DAYS. TAKE WITH FOOD. 30 tablet 0  . fluconazole (DIFLUCAN) 200 MG tablet Take 1 tablet by mouth daily 7 tablet 0  . lidocaine-prilocaine (EMLA) cream Apply to affected area once 30 g 3  . loratadine (CLARITIN) 10 MG tablet Take 1 tablet (10 mg total) by mouth  daily. 60 tablet 0  . LORazepam (ATIVAN) 0.5 MG tablet TAKE 1 TABLET BY MOUTH AT BEDTIME AS NEEDED (  NAUSEA  OR  VOMITING) 30 tablet 0  . magic mouthwash SOLN Take 5 mLs by mouth 3 (three) times daily as needed for mouth pain. 240 mL 0  . Omeprazole-Sodium Bicarbonate (ZEGERID) 20-1100 MG CAPS capsule Take 1 capsule by mouth daily before breakfast. 90 each 3  . polyethylene glycol (MIRALAX / GLYCOLAX) packet Take 17 g by mouth as needed. (Patient taking differently: Take 17 g by mouth daily as needed  (constipation). Mix in 8 oz liquid and drink) 100 each 5  . potassium chloride (K-DUR) 10 MEQ tablet TAKE 1 TABLET BY MOUTH ONCE DAILY 90 tablet 3  . prochlorperazine (COMPAZINE) 10 MG tablet Take 1 tablet (10 mg total) by mouth every 6 (six) hours as needed (Nausea or vomiting). 30 tablet 1  . traMADol (ULTRAM) 50 MG tablet Take 1-2 tablets (50-100 mg total) by mouth every 6 (six) hours as needed for moderate pain. 20 tablet 0  . valACYclovir (VALTREX) 500 MG tablet Take 1 tablet (500 mg total) by mouth 2 (two) times daily. 60 tablet 0   No current facility-administered medications for this visit.      OBJECTIVE: Young African-American woman in no acute distress  Vitals:   02/04/19 0911  BP: 140/67  Pulse: (!) 107  Resp: 18  Temp: 100.1 F (37.8 C)  SpO2: 100%     Body mass index is 31.83 kg/m.   Wt Readings from Last 3 Encounters:  02/04/19 174 lb (78.9 kg)  01/28/19 168 lb 14.4 oz (76.6 kg)  01/07/19 165 lb 12.8 oz (75.2 kg)   ECOG FS:2 - Symptomatic, <50% confined to bed  Sclerae unicteric, EOMs intact Wearing a mask No cervical or supraclavicular adenopathy Lungs no rales or rhonchi Heart regular rate and rhythm Abd soft, nontender, positive bowel sounds MSK no focal spinal tenderness, no upper extremity lymphedema Neuro: nonfocal, well oriented, appropriate affect Breasts: The right breast as noted before is larger than the left and the projecting area of tumor in the lower right quadrant is still palpable, although to me it seems a little bit smaller and possibly a little bit softer than before.  She agrees that there has been a change with this 1 cycle of carbotaxol she received last week.  The left breast is unremarkable.  Both axillae are benign  LAB RESULTS:  CMP     Component Value Date/Time   NA 143 01/28/2019 0842   K 3.6 01/28/2019 0842   CL 104 01/28/2019 0842   CO2 29 01/28/2019 0842   GLUCOSE 108 (H) 01/28/2019 0842   BUN 11 01/28/2019 0842    CREATININE 0.74 01/28/2019 0842   CREATININE 0.86 11/06/2018 0821   CALCIUM 9.4 01/28/2019 0842   PROT 6.7 01/28/2019 0842   ALBUMIN 3.6 01/28/2019 0842   AST 10 (L) 01/28/2019 0842   AST 11 (L) 11/06/2018 0821   ALT 22 01/28/2019 0842   ALT 25 11/06/2018 0821   ALKPHOS 115 01/28/2019 0842   BILITOT 0.3 01/28/2019 0842   BILITOT 0.5 11/06/2018 0821   GFRNONAA >60 01/28/2019 0842   GFRNONAA >60 11/06/2018 0821   GFRAA >60 01/28/2019 0842   GFRAA >60 11/06/2018 0821    No results found for: TOTALPROTELP, ALBUMINELP, A1GS, A2GS, BETS, BETA2SER, GAMS, MSPIKE, SPEI  No results found for: KPAFRELGTCHN, LAMBDASER, KAPLAMBRATIO  Lab Results  Component Value Date   WBC  13.7 (H) 02/04/2019   NEUTROABS 10.9 (H) 02/04/2019   HGB 10.3 (L) 02/04/2019   HCT 32.8 (L) 02/04/2019   MCV 96.8 02/04/2019   PLT 366 02/04/2019    '@LASTCHEMISTRY'$ @  No results found for: LABCA2  No components found for: [id number]  No results for input(s): INR in the last 168 hours.  No results found for: LABCA2  No results found for: IDP824  No results found for: MPN361  No results found for: WER154  No results found for: CA2729  No components found for: HGQUANT  No results found for: CEA1 / No results found for: CEA1   No results found for: AFPTUMOR  No results found for: CHROMOGRNA  No results found for: PSA1  Appointment on 02/04/2019  Component Date Value Ref Range Status  . WBC 02/04/2019 13.7* 4.0 - 10.5 K/uL Final  . RBC 02/04/2019 3.39* 3.87 - 5.11 MIL/uL Final  . Hemoglobin 02/04/2019 10.3* 12.0 - 15.0 g/dL Final  . HCT 02/04/2019 32.8* 36.0 - 46.0 % Final  . MCV 02/04/2019 96.8  80.0 - 100.0 fL Final  . MCH 02/04/2019 30.4  26.0 - 34.0 pg Final  . MCHC 02/04/2019 31.4  30.0 - 36.0 g/dL Final  . RDW 02/04/2019 19.5* 11.5 - 15.5 % Final  . Platelets 02/04/2019 366  150 - 400 K/uL Final  . nRBC 02/04/2019 0.0  0.0 - 0.2 % Final  . Neutrophils Relative % 02/04/2019 80  % Final  .  Neutro Abs 02/04/2019 10.9* 1.7 - 7.7 K/uL Final  . Lymphocytes Relative 02/04/2019 10  % Final  . Lymphs Abs 02/04/2019 1.4  0.7 - 4.0 K/uL Final  . Monocytes Relative 02/04/2019 5  % Final  . Monocytes Absolute 02/04/2019 0.7  0.1 - 1.0 K/uL Final  . Eosinophils Relative 02/04/2019 3  % Final  . Eosinophils Absolute 02/04/2019 0.4  0.0 - 0.5 K/uL Final  . Basophils Relative 02/04/2019 0  % Final  . Basophils Absolute 02/04/2019 0.0  0.0 - 0.1 K/uL Final  . Immature Granulocytes 02/04/2019 2  % Final  . Abs Immature Granulocytes 02/04/2019 0.33* 0.00 - 0.07 K/uL Final   Performed at [facility], [address] [place], [place] [address]    (this displays the last labs from the last 3 days)  No results found for: TOTALPROTELP, ALBUMINELP, A1GS, A2GS, BETS, BETA2SER, GAMS, MSPIKE, SPEI (this displays SPEP labs)  No results found for: KPAFRELGTCHN, LAMBDASER, KAPLAMBRATIO (kappa/lambda light chains)  No results found for: HGBA, HGBA2QUANT, HGBFQUANT, HGBSQUAN (Hemoglobinopathy evaluation)   No results found for: LDH  No results found for: IRON, TIBC, IRONPCTSAT (Iron and TIBC)  No results found for: FERRITIN  Urinalysis    Component Value Date/Time   COLORURINE STRAW (A) 12/06/2018 1014   APPEARANCEUR CLEAR 12/06/2018 1014   LABSPEC 1.009 12/06/2018 1014   PHURINE 7.0 12/06/2018 1014   GLUCOSEU NEGATIVE 12/06/2018 1014   HGBUR SMALL (A) 12/06/2018 1014   BILIRUBINUR NEGATIVE 12/06/2018 1014   BILIRUBINUR negative 05/31/2018 1011   KETONESUR NEGATIVE 12/06/2018 1014   PROTEINUR NEGATIVE 12/06/2018 1014   UROBILINOGEN 0.2 05/31/2018 1011   UROBILINOGEN 0.2 05/18/2010 1042   NITRITE NEGATIVE 12/06/2018 1014   LEUKOCYTESUR NEGATIVE 12/06/2018 1014     STUDIES:  Mr Breast Bilateral W Wo Contrast Inc Cad  Result Date: 01/31/2019 CLINICAL DATA:  48 year old female for follow-up of known triple negative RIGHT breast cancer diagnosed in February  2020, currently on neoadjuvant chemotherapy. LABS:  None performed  today EXAM: BILATERAL BREAST MRI WITH AND WITHOUT CONTRAST TECHNIQUE: Multiplanar, multisequence MR images of both breasts were obtained prior to and following the intravenous administration of 8 ml of Gadavist Three-dimensional MR images were rendered by post-processing of the original MR data on an independent workstation. The three-dimensional MR images were interpreted, and findings are reported in the following complete MRI report for this study. Three dimensional images were evaluated at the independent DynaCad workstation COMPARISON:  11/12/2018 breast MRI and prior studies FINDINGS: Breast composition: b. Scattered fibroglandular tissue. Background parenchymal enhancement: Mild Right breast: The discrete irregular mass (biopsy-proven malignancy) within the central and UPPER RIGHT breast containing biopsy clip artifact has enlarged now measuring 10 x 7.5 x 8 cm (transverse x AP x CC), previously 5.4 x 4.6 x 3.9 cm. This mass now extends to the skin of the UPPER-OUTER RIGHT breast and continues to have a large contact area with the RIGHT pectoralis muscle. Adjacent non masslike enhancement is less prominent when compared to the prior study but still persists, primarily along the anterior and medial aspect of the enlarging biopsy-proven malignancy. The discrete malignancy and non masslike enhancement measures 12.5 x 8 x 8 cm. Left breast: A subtle 1.2 cm area of linear enhancement within the UPPER INNER LEFT breast (series 6, image 83) at mid depth is noted. No other suspicious abnormalities noted. Lymph nodes: An unchanged 3 x 7 mm RIGHT internal mammary lymph node (6:61-67) is noted. Prominent RIGHT axillary lymph nodes now have a normal appearance. No abnormal LEFT axillary lymph nodes identified. Ancillary findings:  None. IMPRESSION: 1. Enlarging biopsy-proven RIGHT breast malignancy, now measuring 10 x 7.5 x 8 cm, previously 5.4 x 4.6 x  3.9 cm and now extends to the UPPER-OUTER RIGHT breast skin. Continued contact with the RIGHT pectoralis muscle. 2. Unchanged 7 mm RIGHT internal mammary lymph node. Previously identified prominent RIGHT axillary lymph nodes now have a normal appearance. 3. 1.2 cm linear non masslike enhancement within the UPPER INNER LEFT breast - DCIS is not excluded. Recommend MR biopsy if this would change treatment. RECOMMENDATION: MR guided LEFT breast biopsy as clinically indicated. BI-RADS CATEGORY  4: Suspicious. Electronically Signed   By: Margarette Canada M.D.   On: 01/31/2019 13:19     ELIGIBLE FOR AVAILABLE RESEARCH PROTOCOL: Declined   ASSESSMENT: 48 y.o. Brittany Rodgers, [place] woman status post right breast lower outer quadrant biopsy 10/29/2018 for a clinical T2 N0, stage IIB invasive ductal carcinoma, grade 3, triple negative, with an MIB-1 of 80%  (a) CT of the chest 11/15/2018 shows possible liver metastases, but liver biopsy 11/25/2018 consistent with hemangioma  (b) bone scan 11/15/2018 shows no suspicious bone lesions  (1) neoadjuvant chemotherapy consisting of doxorubicin and cyclophosphamide in dose dense fashion x4, started 11/26/2018, completed 01/07/2019, to be followed by weekly paclitaxel and carboplatin x12 starting 01/28/2019  (2) patient desires ovarian function preservation: Zoladex started 11/08/2018  (3) definitive surgery to follow chemo  (4) adjuvant radiation to follow  (5) genetics counseling on 11/28/2018, testing was recommended, but declined by patient.   PLAN: Brittany Rodgers is proceeding to her second of 12 planned cycles of paclitaxel and carboplatin today.  She tolerated the first cycle well.  It may be that there has been a slight change in the tumor with that 1 dose.  We reviewed her MRI results in detail, I showed her the images, and she took a picture of them to show her family.  The tumor is clearly now extending to the skin  laterally.  On the other hand overall it appears  better demarcated than before.  It still abuts the chest wall.  The right axillary lymph nodes have resolved.  We discussed the very small change in the left breast.  We are going to set this up for MRI biopsy sometime in the next couple of weeks  I think it would be reasonable, since she needs the chemotherapy in any case, to continue the current treatment for another 3 doses, including today's dose, and then reassess.  If it looks like the tumor is shrinking with this chemo, which should be easily accessible by palpation, then we will proceed to the full 12 cycles of chemo.  Otherwise it might be best for her to go ahead and have surgery and then complete the chemo afterwards.  She will be interested in eventual reconstruction and will benefit from meeting with plastic surgery.  I will set that up sometime in the next 2 to 3 weeks  We are going to present her case tomorrow in conference and she already has an appointment with Dr. Donne Hazel her surgeon later this week.  Brittany Rodgers  02/04/19 9:50 AM Medical Oncology and Hematology [facility] [address] [place], [place] [address] Tel. [phone]    Fax. [phone]   I, Wilburn Mylar, am acting as scribe for Dr. [name]. Magrinat.  I, Lurline Del Rodgers, have reviewed the above documentation for accuracy and completeness, and I agree with the above.

## 2019-02-04 ENCOUNTER — Inpatient Hospital Stay: Payer: BC Managed Care – PPO

## 2019-02-04 ENCOUNTER — Other Ambulatory Visit: Payer: Self-pay

## 2019-02-04 ENCOUNTER — Inpatient Hospital Stay (HOSPITAL_BASED_OUTPATIENT_CLINIC_OR_DEPARTMENT_OTHER): Payer: BC Managed Care – PPO | Admitting: Oncology

## 2019-02-04 VITALS — HR 99

## 2019-02-04 VITALS — BP 140/67 | HR 107 | Temp 100.1°F | Resp 18 | Ht 62.0 in | Wt 174.0 lb

## 2019-02-04 DIAGNOSIS — T451X5A Adverse effect of antineoplastic and immunosuppressive drugs, initial encounter: Secondary | ICD-10-CM | POA: Insufficient documentation

## 2019-02-04 DIAGNOSIS — Z95828 Presence of other vascular implants and grafts: Secondary | ICD-10-CM

## 2019-02-04 DIAGNOSIS — Z171 Estrogen receptor negative status [ER-]: Secondary | ICD-10-CM

## 2019-02-04 DIAGNOSIS — C50511 Malignant neoplasm of lower-outer quadrant of right female breast: Secondary | ICD-10-CM | POA: Diagnosis not present

## 2019-02-04 DIAGNOSIS — D6481 Anemia due to antineoplastic chemotherapy: Secondary | ICD-10-CM | POA: Insufficient documentation

## 2019-02-04 DIAGNOSIS — Z5111 Encounter for antineoplastic chemotherapy: Secondary | ICD-10-CM | POA: Diagnosis not present

## 2019-02-04 LAB — CMP (CANCER CENTER ONLY)
ALT: 20 U/L (ref 0–44)
AST: <6 U/L — ABNORMAL LOW (ref 15–41)
Albumin: 3.6 g/dL (ref 3.5–5.0)
Alkaline Phosphatase: 95 U/L (ref 38–126)
Anion gap: 11 (ref 5–15)
BUN: 15 mg/dL (ref 6–20)
CO2: 28 mmol/L (ref 22–32)
Calcium: 9 mg/dL (ref 8.9–10.3)
Chloride: 103 mmol/L (ref 98–111)
Creatinine: 0.72 mg/dL (ref 0.44–1.00)
GFR, Est AFR Am: >60 mL/min (ref >60)
GFR, Estimated: >60 mL/min (ref >60)
Glucose, Bld: 98 mg/dL (ref 70–99)
Potassium: 3.7 mmol/L (ref 3.5–5.1)
Sodium: 142 mmol/L (ref 135–145)
Total Bilirubin: 0.2 mg/dL — ABNORMAL LOW (ref 0.3–1.2)
Total Protein: 6.4 g/dL — ABNORMAL LOW (ref 6.5–8.1)

## 2019-02-04 LAB — CBC WITH DIFFERENTIAL/PLATELET
Abs Immature Granulocytes: 0.33 10*3/uL — ABNORMAL HIGH (ref 0.00–0.07)
Basophils Absolute: 0 10*3/uL (ref 0.0–0.1)
Basophils Relative: 0 %
Eosinophils Absolute: 0.4 10*3/uL (ref 0.0–0.5)
Eosinophils Relative: 3 %
HCT: 32.8 % — ABNORMAL LOW (ref 36.0–46.0)
Hemoglobin: 10.3 g/dL — ABNORMAL LOW (ref 12.0–15.0)
Immature Granulocytes: 2 %
Lymphocytes Relative: 10 %
Lymphs Abs: 1.4 10*3/uL (ref 0.7–4.0)
MCH: 30.4 pg (ref 26.0–34.0)
MCHC: 31.4 g/dL (ref 30.0–36.0)
MCV: 96.8 fL (ref 80.0–100.0)
Monocytes Absolute: 0.7 10*3/uL (ref 0.1–1.0)
Monocytes Relative: 5 %
Neutro Abs: 10.9 10*3/uL — ABNORMAL HIGH (ref 1.7–7.7)
Neutrophils Relative %: 80 %
Platelets: 366 10*3/uL (ref 150–400)
RBC: 3.39 MIL/uL — ABNORMAL LOW (ref 3.87–5.11)
RDW: 19.5 % — ABNORMAL HIGH (ref 11.5–15.5)
WBC: 13.7 10*3/uL — ABNORMAL HIGH (ref 4.0–10.5)
nRBC: 0 % (ref 0.0–0.2)

## 2019-02-04 MED ORDER — GOSERELIN ACETATE 3.6 MG ~~LOC~~ IMPL
3.6000 mg | DRUG_IMPLANT | Freq: Once | SUBCUTANEOUS | Status: AC
  Administered 2019-02-04: 13:00:00 3.6 mg via SUBCUTANEOUS

## 2019-02-04 MED ORDER — FAMOTIDINE IN NACL 20-0.9 MG/50ML-% IV SOLN
20.0000 mg | Freq: Once | INTRAVENOUS | Status: AC
  Administered 2019-02-04: 11:00:00 20 mg via INTRAVENOUS

## 2019-02-04 MED ORDER — SODIUM CHLORIDE 0.9 % IV SOLN
Freq: Once | INTRAVENOUS | Status: AC
  Administered 2019-02-04: 10:00:00 via INTRAVENOUS
  Filled 2019-02-04: qty 250

## 2019-02-04 MED ORDER — SODIUM CHLORIDE 0.9% FLUSH
10.0000 mL | Freq: Once | INTRAVENOUS | Status: AC
  Administered 2019-02-04: 10 mL
  Filled 2019-02-04: qty 10

## 2019-02-04 MED ORDER — PALONOSETRON HCL INJECTION 0.25 MG/5ML
0.2500 mg | Freq: Once | INTRAVENOUS | Status: AC
  Administered 2019-02-04: 0.25 mg via INTRAVENOUS

## 2019-02-04 MED ORDER — GOSERELIN ACETATE 3.6 MG ~~LOC~~ IMPL
DRUG_IMPLANT | SUBCUTANEOUS | Status: AC
  Filled 2019-02-04: qty 3.6

## 2019-02-04 MED ORDER — DIPHENHYDRAMINE HCL 50 MG/ML IJ SOLN
INTRAMUSCULAR | Status: AC
  Filled 2019-02-04: qty 1

## 2019-02-04 MED ORDER — FAMOTIDINE IN NACL 20-0.9 MG/50ML-% IV SOLN
INTRAVENOUS | Status: AC
  Filled 2019-02-04: qty 50

## 2019-02-04 MED ORDER — SODIUM CHLORIDE 0.9% FLUSH
10.0000 mL | INTRAVENOUS | Status: DC | PRN
  Administered 2019-02-04: 13:00:00 10 mL
  Filled 2019-02-04: qty 10

## 2019-02-04 MED ORDER — SODIUM CHLORIDE 0.9 % IV SOLN
240.0000 mg | Freq: Once | INTRAVENOUS | Status: AC
  Administered 2019-02-04: 240 mg via INTRAVENOUS
  Filled 2019-02-04: qty 24

## 2019-02-04 MED ORDER — DIPHENHYDRAMINE HCL 50 MG/ML IJ SOLN
25.0000 mg | Freq: Once | INTRAMUSCULAR | Status: AC
  Administered 2019-02-04: 25 mg via INTRAVENOUS

## 2019-02-04 MED ORDER — SODIUM CHLORIDE 0.9 % IV SOLN
80.0000 mg/m2 | Freq: Once | INTRAVENOUS | Status: AC
  Administered 2019-02-04: 144 mg via INTRAVENOUS
  Filled 2019-02-04: qty 24

## 2019-02-04 MED ORDER — PALONOSETRON HCL INJECTION 0.25 MG/5ML
INTRAVENOUS | Status: AC
  Filled 2019-02-04: qty 5

## 2019-02-04 MED ORDER — HEPARIN SOD (PORK) LOCK FLUSH 100 UNIT/ML IV SOLN
500.0000 [IU] | Freq: Once | INTRAVENOUS | Status: AC | PRN
  Administered 2019-02-04: 500 [IU]
  Filled 2019-02-04: qty 5

## 2019-02-04 MED ORDER — SODIUM CHLORIDE 0.9 % IV SOLN
20.0000 mg | Freq: Once | INTRAVENOUS | Status: AC
  Administered 2019-02-04: 20 mg via INTRAVENOUS
  Filled 2019-02-04: qty 20

## 2019-02-04 NOTE — Patient Instructions (Signed)
[facility] °Discharge Instructions for Patients Receiving Chemotherapy ° °Today you received the following chemotherapy agents Taxol; Carboplatin ° °To help prevent nausea and vomiting after your treatment, we encourage you to take your nausea medication as directed °  °If you develop nausea and vomiting that is not controlled by your nausea medication, call the clinic.  ° °BELOW ARE SYMPTOMS THAT SHOULD BE REPORTED IMMEDIATELY: °· *FEVER GREATER THAN 100.5 F °· *CHILLS WITH OR WITHOUT FEVER °· NAUSEA AND VOMITING THAT IS NOT CONTROLLED WITH YOUR NAUSEA MEDICATION °· *UNUSUAL SHORTNESS OF BREATH °· *UNUSUAL BRUISING OR BLEEDING °· TENDERNESS IN MOUTH AND THROAT WITH OR WITHOUT PRESENCE OF ULCERS °· *URINARY PROBLEMS °· *BOWEL PROBLEMS °· UNUSUAL RASH °Items with * indicate a potential emergency and should be followed up as soon as possible. ° °Feel free to call the clinic should you have any questions or concerns. The clinic phone number is [phone]. ° °Please show the CHEMO ALERT CARD at check-in to the Emergency Department and triage nurse. ° ° °

## 2019-02-05 ENCOUNTER — Other Ambulatory Visit: Payer: Self-pay | Admitting: Oncology

## 2019-02-05 ENCOUNTER — Telehealth: Payer: Self-pay | Admitting: *Deleted

## 2019-02-05 DIAGNOSIS — D6481 Anemia due to antineoplastic chemotherapy: Secondary | ICD-10-CM

## 2019-02-05 DIAGNOSIS — T451X5A Adverse effect of antineoplastic and immunosuppressive drugs, initial encounter: Secondary | ICD-10-CM

## 2019-02-05 DIAGNOSIS — C50511 Malignant neoplasm of lower-outer quadrant of right female breast: Secondary | ICD-10-CM

## 2019-02-05 NOTE — Progress Notes (Signed)
Johnny's case was discussed at conference.  We all agreed that we should continue chemotherapy at least a few more cycles to see if we can make the surgery more feasible.  We will also add pembrolizumab.

## 2019-02-05 NOTE — Telephone Encounter (Signed)
Spoke with patient to inform her of the date and time for MRI biopsy of left breast. This is scheduled for 02/10/19 at 7am.  Patient verbalized understanding.

## 2019-02-06 ENCOUNTER — Telehealth: Payer: Self-pay | Admitting: Oncology

## 2019-02-06 NOTE — Telephone Encounter (Signed)
Tried to reach regarding change in schedule

## 2019-02-10 ENCOUNTER — Ambulatory Visit
Admission: RE | Admit: 2019-02-10 | Discharge: 2019-02-10 | Disposition: A | Discharge status: Home / Self Care | Payer: BLUE CROSS/BLUE SHIELD | Source: Ambulatory Visit | Attending: Oncology | Admitting: Oncology

## 2019-02-10 ENCOUNTER — Other Ambulatory Visit: Payer: Self-pay

## 2019-02-10 DIAGNOSIS — D6481 Anemia due to antineoplastic chemotherapy: Secondary | ICD-10-CM

## 2019-02-10 DIAGNOSIS — C50511 Malignant neoplasm of lower-outer quadrant of right female breast: Secondary | ICD-10-CM

## 2019-02-10 MED ORDER — GADOBUTROL 1 MMOL/ML IV SOLN
8.0000 mL | Freq: Once | INTRAVENOUS | Status: AC | PRN
  Administered 2019-02-10: 09:00:00 8 mL via INTRAVENOUS

## 2019-02-10 NOTE — Progress Notes (Signed)
[place]  Telephone:([phone] Fax:(336) [phone]    ID: Bosie Helper Monreal DOB: 10-27-1970  MR#: [id number]  HBZ#:[id number]  Patient Care Team: [name], MD as PCP - [name], MD as Consulting Physician (General Surgery) Alexie Lanni, [name], MD as Consulting Physician (Oncology) [name], MD as Attending Physician (Radiation Oncology) [name], MD as Consulting Physician (Gastroenterology) [name], MD as Consulting Physician (Obstetrics and Gynecology) [name], RN as Oncology Nurse Navigator [name], RN as Oncology Nurse Navigator [name], MD as Consulting Physician (Plastic Surgery) OTHER MD: Dr. Siri Cole (urology)    CHIEF COMPLAINT: Triple negative breast cancer  CURRENT TREATMENT: Neoadjuvant chemotherapy, pembrolizumab, goserelin   INTERVAL HISTORY: [name] returns today for follow-up and treatment of her triple negative breast cancer.  She continues on weekly paclitaxel and carboplatin x12. Today is day 1 cycle 3. She reports aching muscles (mainly in her arms), chest/breast pain (for which she uses Aleve and tylenol with minimal relief), and swollen feet. She also notes nausea on Saturday. She also reports some swelling to her right breast that always goes down.  Her most recent echocardiogram was on 11/13/2018 and showed a ejection fraction of 60-65%.  Most recent goserelin dose was 02/04/2019. She reports hot flashes and diminished sex drive.  Since her last visit, she underwent biopsy of the questionable enhancement in the upper-inner left breast yesterday, 02/10/2019. The pathology results are not back yet as of this note.  Today she will start her every 3-week pembrolizumab treatment.   REVIEW OF SYSTEMS: [name] reports doing okay overall. She reports her surgeon spoke to her about bilateral mastectomy, as opposed to shrinking the tumor followed by lumpectomy. She is scheduled  to meet with a plastic surgeon in the near future.  The patient denies unusual headaches, visual changes, nausea, vomiting, stiff neck, dizziness, or gait imbalance. There has been no cough, phlegm production, or pleurisy, no chest pain or pressure, and no change in bowel or bladder habits. The patient denies fever, rash, bleeding, unexplained fatigue or unexplained weight loss. A detailed review of systems was otherwise entirely negative.   HISTORY OF CURRENT ILLNESS: From the original intake note:  [name] Dec presented with a palpable right breast lump and associated tenderness for approximately 1 week. She called her doctor immediately following  She underwent unilateral right diagnostic mammography with tomography and right breast ultrasonography at [facility] on 10/28/2018 showing: Breast Density Category C. Radiopaque BB was placed at the site of the patient's palpable lump in the central right breast. An irregular hyperdense mass with associated increased trabecular thickening is seen in the deep central right breast. On physical exam, a firm, fixed mass in the central right breast is palpated. Target ultrasound is performed, showing and irregular hypoechoic mass at the 8 o'clock position 3 cm from the nipple. It measures 3.6 x 2.9 x 2.3 cm. There is associated peripheral vascularity. Evaluation of the right axilla demonstrates a single prominent lymph node demonstrating 0.4 cm cortical thickening. The remaining lymph nodes ave normal appearance with a thin, symmetric cortex.   Accordingly on 10/29/2018 she proceeded to biopsy of the right breast area in question. The pathology from this procedure showed ([id number]): invasive ductal carcinoma, grade III. Prognostic indicators significant for: estrogen receptor, 0% negative and progesterone receptor, 0% negative. Proliferation marker Ki67 at 80%. HER2 negative (1+) by immunohistochemistry.  Biopsy of the right axilla was performed on the  same day. The pathology  from this procedure showed ([id number]): lymph node, negative for carcinoma.  This was felt to be concordant  The patient's subsequent history is as detailed below.   PAST MEDICAL HISTORY: Past Medical History:  Diagnosis Date  . Chronic cough   . Family history of breast cancer   . GERD (gastroesophageal reflux disease)   . Hyperlipidemia   . IBS (irritable bowel syndrome)    sees Dr. Silvano Rusk   . Routine gynecological examination    sees Dr. Freda Munro     PAST SURGICAL HISTORY: Past Surgical History:  Procedure Laterality Date  . CESAREAN SECTION    . PORTACATH PLACEMENT Right 11/14/2018   Procedure: INSERTION PORT-A-CATH WITH ULTRASOUND;  Surgeon: Rolm Bookbinder, MD;  Location: [facility];  Service: General;  Laterality: Right;  . ROOT CANAL       FAMILY HISTORY: Family History  Problem Relation Age of Onset  . Hypertension Other   . Breast cancer Other        mat first cousin's daughter  . Breast cancer Maternal Aunt   . Hypertension Mother   . Breast cancer Maternal Aunt   . Breast cancer Maternal Aunt    [name] Mae's father died from an unknown cause at age 50. [name] died from unknown health complications at age 50. The patient has 4 sisters. [name] has 3 maternal aunts that have been diagnosed with breast cancer. Patient denies anyone in her family having ovarian, prostate, or pancreatic cancer.    GYNECOLOGIC HISTORY:  No LMP recorded. 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 Her husband, Samaria Anes, is a Administrator. The patient has two children, [name] and [name], from another relationship. [name] is 3, lives in [place]. [name] is 68, lives in [place], and is in [employer].  The patient's husband [name] has a child,  [name], from another relationship; she lives in [place] and is in school. The patient has 5 grandchildren. She attends the [employer].   ADVANCED DIRECTIVES: [name]'s husband, [name], is automatically her healthcare power of attorney.     HEALTH MAINTENANCE: Social History   Tobacco Use  . Smoking status: Never Smoker  . Smokeless tobacco: Never Used  Substance Use Topics  . Alcohol use: No    Alcohol/week: 0.0 standard drinks  . Drug use: No    Colonoscopy: yes  PAP: 02/2018  Bone density: no   Allergies  Allergen Reactions  . Barbiturates Other (See Comments)    Keeps pt awake  . Sulfonamide Derivatives Itching    Current Outpatient Medications  Medication Sig Dispense Refill  . atorvastatin (LIPITOR) 20 MG tablet Take 1 tablet (20 mg total) by mouth daily. 90 tablet 3  . cyclobenzaprine (FLEXERIL) 10 MG tablet TAKE 1 TABLET BY MOUTH THREE TIMES DAILY AS NEEDED FOR MUSCLE SPASMS 90 tablet 0  . dexamethasone (DECADRON) 4 MG tablet TAKE 2 TABLETS BY MOUTH ONCE DAILY ON THE DAY AFTER CHEMOTHERAPY AND THEN TAKE 2 TABLETS TWICE DAILY FOR 2 DAYS. TAKE WITH FOOD. 30 tablet 0  . fluconazole (DIFLUCAN) 200 MG tablet Take 1 tablet by mouth daily 7 tablet 0  . lidocaine-prilocaine (EMLA) cream Apply to affected area once 30 g 3  . loratadine (CLARITIN) 10 MG tablet Take 1 tablet (10 mg total) by mouth daily. 60 tablet 0  .  LORazepam (ATIVAN) 0.5 MG tablet TAKE 1 TABLET BY MOUTH AT BEDTIME AS NEEDED (  NAUSEA  OR  VOMITING) 30 tablet 0  . magic mouthwash SOLN Take 5 mLs by mouth 3 (three) times daily as needed for mouth pain. 240 mL 0  . Omeprazole-Sodium Bicarbonate (ZEGERID) 20-1100 MG CAPS capsule Take 1 capsule by mouth daily before breakfast. 90 each 3  . ondansetron (ZOFRAN) 8 MG tablet Take 1 tablet (8 mg total) by mouth every 8 (eight) hours as needed for nausea or vomiting. 20 tablet 0  . polyethylene glycol (MIRALAX / GLYCOLAX) packet Take 17 g by mouth as needed.  (Patient taking differently: Take 17 g by mouth daily as needed (constipation). Mix in 8 oz liquid and drink) 100 each 5  . potassium chloride (K-DUR) 10 MEQ tablet TAKE 1 TABLET BY MOUTH ONCE DAILY 90 tablet 3  . prochlorperazine (COMPAZINE) 10 MG tablet Take 1 tablet (10 mg total) by mouth every 6 (six) hours as needed (Nausea or vomiting). 30 tablet 1  . traMADol (ULTRAM) 50 MG tablet Take 1-2 tablets (50-100 mg total) by mouth every 6 (six) hours as needed for moderate pain. 20 tablet 0  . valACYclovir (VALTREX) 500 MG tablet Take 1 tablet (500 mg total) by mouth 2 (two) times daily. 60 tablet 0   No current facility-administered medications for this visit.      OBJECTIVE: Young African-American woman who appears stated age Vitals:   02/11/19 0913  BP: (!) 142/86  Pulse: 98  Resp: 19  Temp: 98 F (36.7 C)  SpO2: 100%     Body mass index is 32.21 kg/m.   Wt Readings from Last 3 Encounters:  02/11/19 176 lb 1.6 oz (79.9 kg)  02/04/19 174 lb (78.9 kg)  01/28/19 168 lb 14.4 oz (76.6 kg)   ECOG FS:2 - Symptomatic, <50% confined to bed  Sclerae unicteric, pupils round and equal Wearing a mask No cervical or supraclavicular adenopathy Lungs no rales or rhonchi Heart regular rate and rhythm Abd soft, nontender, positive bowel sounds MSK no focal spinal tenderness, no upper extremity lymphedema Neuro: nonfocal, well oriented, appropriate affect Breasts: The left breast is status post recent biopsy.  It is otherwise unremarkable.  The right breast shows the mass that was pushing against the lateral aspect of the lower outer quadrant to be a bit flatter.  There is no erythema and there is no ulceration.  LAB RESULTS:  CMP     Component Value Date/Time   NA 142 02/04/2019 0836   K 3.7 02/04/2019 0836   CL 103 02/04/2019 0836   CO2 28 02/04/2019 0836   GLUCOSE 98 02/04/2019 0836   BUN 15 02/04/2019 0836   CREATININE 0.72 02/04/2019 0836   CALCIUM 9.0 02/04/2019 0836   PROT  6.4 (L) 02/04/2019 0836   ALBUMIN 3.6 02/04/2019 0836   AST <6 (L) 02/04/2019 0836   ALT 20 02/04/2019 0836   ALKPHOS 95 02/04/2019 0836   BILITOT 0.2 (L) 02/04/2019 0836   GFRNONAA >60 02/04/2019 0836   GFRAA >60 02/04/2019 0836    No results found for: TOTALPROTELP, ALBUMINELP, A1GS, A2GS, BETS, BETA2SER, GAMS, MSPIKE, SPEI  No results found for: KPAFRELGTCHN, LAMBDASER, KAPLAMBRATIO  Lab Results  Component Value Date   WBC 7.4 02/11/2019   NEUTROABS 5.8 02/11/2019   HGB 9.9 (L) 02/11/2019   HCT 31.7 (L) 02/11/2019   MCV 99.1 02/11/2019   PLT 278 02/11/2019    '@LASTCHEMISTRY'$ @  No results found for:  LABCA2  No components found for: [id number]  No results for input(s): INR in the last 168 hours.  No results found for: LABCA2  No results found for: IWP809  No results found for: XIP382  No results found for: NKN397  No results found for: CA2729  No components found for: HGQUANT  No results found for: CEA1 / No results found for: CEA1   No results found for: AFPTUMOR  No results found for: CHROMOGRNA  No results found for: PSA1  Appointment on 02/11/2019  Component Date Value Ref Range Status  . WBC 02/11/2019 7.4  4.0 - 10.5 K/uL Final  . RBC 02/11/2019 3.20* 3.87 - 5.11 MIL/uL Final  . Hemoglobin 02/11/2019 9.9* 12.0 - 15.0 g/dL Final  . HCT 02/11/2019 31.7* 36.0 - 46.0 % Final  . MCV 02/11/2019 99.1  80.0 - 100.0 fL Final  . MCH 02/11/2019 30.9  26.0 - 34.0 pg Final  . MCHC 02/11/2019 31.2  30.0 - 36.0 g/dL Final  . RDW 02/11/2019 20.6* 11.5 - 15.5 % Final  . Platelets 02/11/2019 278  150 - 400 K/uL Final  . nRBC 02/11/2019 0.3* 0.0 - 0.2 % Final  . Neutrophils Relative % 02/11/2019 78  % Final  . Neutro Abs 02/11/2019 5.8  1.7 - 7.7 K/uL Final  . Lymphocytes Relative 02/11/2019 12  % Final  . Lymphs Abs 02/11/2019 0.9  0.7 - 4.0 K/uL Final  . Monocytes Relative 02/11/2019 5  % Final  . Monocytes Absolute 02/11/2019 0.4  0.1 - 1.0 K/uL Final  .  Eosinophils Relative 02/11/2019 3  % Final  . Eosinophils Absolute 02/11/2019 0.2  0.0 - 0.5 K/uL Final  . Basophils Relative 02/11/2019 0  % Final  . Basophils Absolute 02/11/2019 0.0  0.0 - 0.1 K/uL Final  . Immature Granulocytes 02/11/2019 2  % Final  . Abs Immature Granulocytes 02/11/2019 0.11* 0.00 - 0.07 K/uL Final   Performed at [facility], [address] [place], [place] [address]    (this displays the last labs from the last 3 days)  No results found for: TOTALPROTELP, ALBUMINELP, A1GS, A2GS, BETS, BETA2SER, GAMS, MSPIKE, SPEI (this displays SPEP labs)  No results found for: KPAFRELGTCHN, LAMBDASER, KAPLAMBRATIO (kappa/lambda light chains)  No results found for: HGBA, HGBA2QUANT, HGBFQUANT, HGBSQUAN (Hemoglobinopathy evaluation)   No results found for: LDH  No results found for: IRON, TIBC, IRONPCTSAT (Iron and TIBC)  No results found for: FERRITIN  Urinalysis    Component Value Date/Time   COLORURINE STRAW (A) 12/06/2018 1014   APPEARANCEUR CLEAR 12/06/2018 1014   LABSPEC 1.009 12/06/2018 1014   PHURINE 7.0 12/06/2018 1014   GLUCOSEU NEGATIVE 12/06/2018 1014   HGBUR SMALL (A) 12/06/2018 1014   BILIRUBINUR NEGATIVE 12/06/2018 1014   BILIRUBINUR negative 05/31/2018 1011   KETONESUR NEGATIVE 12/06/2018 1014   PROTEINUR NEGATIVE 12/06/2018 1014   UROBILINOGEN 0.2 05/31/2018 1011   UROBILINOGEN 0.2 05/18/2010 1042   NITRITE NEGATIVE 12/06/2018 1014   LEUKOCYTESUR NEGATIVE 12/06/2018 1014     STUDIES:  Mr Breast Bilateral W Wo Contrast Inc Cad  Result Date: 01/31/2019 CLINICAL DATA:  48 year old female for follow-up of known triple negative RIGHT breast cancer diagnosed in February 2020, currently on neoadjuvant chemotherapy. LABS:  None performed today EXAM: BILATERAL BREAST MRI WITH AND WITHOUT CONTRAST TECHNIQUE: Multiplanar, multisequence MR images of both breasts were obtained prior to and following the intravenous administration of  8 ml of Gadavist Three-dimensional MR images were rendered by post-processing of  the original MR data on an independent workstation. The three-dimensional MR images were interpreted, and findings are reported in the following complete MRI report for this study. Three dimensional images were evaluated at the independent DynaCad workstation COMPARISON:  11/12/2018 breast MRI and prior studies FINDINGS: Breast composition: b. Scattered fibroglandular tissue. Background parenchymal enhancement: Mild Right breast: The discrete irregular mass (biopsy-proven malignancy) within the central and UPPER RIGHT breast containing biopsy clip artifact has enlarged now measuring 10 x 7.5 x 8 cm (transverse x AP x CC), previously 5.4 x 4.6 x 3.9 cm. This mass now extends to the skin of the UPPER-OUTER RIGHT breast and continues to have a large contact area with the RIGHT pectoralis muscle. Adjacent non masslike enhancement is less prominent when compared to the prior study but still persists, primarily along the anterior and medial aspect of the enlarging biopsy-proven malignancy. The discrete malignancy and non masslike enhancement measures 12.5 x 8 x 8 cm. Left breast: A subtle 1.2 cm area of linear enhancement within the UPPER INNER LEFT breast (series 6, image 83) at mid depth is noted. No other suspicious abnormalities noted. Lymph nodes: An unchanged 3 x 7 mm RIGHT internal mammary lymph node (6:61-67) is noted. Prominent RIGHT axillary lymph nodes now have a normal appearance. No abnormal LEFT axillary lymph nodes identified. Ancillary findings:  None. IMPRESSION: 1. Enlarging biopsy-proven RIGHT breast malignancy, now measuring 10 x 7.5 x 8 cm, previously 5.4 x 4.6 x 3.9 cm and now extends to the UPPER-OUTER RIGHT breast skin. Continued contact with the RIGHT pectoralis muscle. 2. Unchanged 7 mm RIGHT internal mammary lymph node. Previously identified prominent RIGHT axillary lymph nodes now have a normal appearance. 3. 1.2  cm linear non masslike enhancement within the UPPER INNER LEFT breast - DCIS is not excluded. Recommend MR biopsy if this would change treatment. RECOMMENDATION: MR guided LEFT breast biopsy as clinically indicated. BI-RADS CATEGORY  4: Suspicious. Electronically Signed   By: Margarette Canada M.D.   On: 01/31/2019 13:19   Mm Clip Placement Left  Result Date: 02/10/2019 CLINICAL DATA:  Status post MRI guided biopsy for suspicious non mass enhancement within the upper inner quadrant of the LEFT breast. EXAM: DIAGNOSTIC LEFT MAMMOGRAM POST MRI BIOPSY COMPARISON:  Previous exam(s). FINDINGS: Mammographic images were obtained following MRI guided biopsy of linear non mass enhancement within the upper inner quadrant of the LEFT breast. Dumbbell-shaped clip is appropriately positioned. IMPRESSION: Dumbbell-shaped clip is appropriately positioned within the upper inner quadrant of the LEFT breast. Final Assessment: Post Procedure Mammograms for Marker Placement Electronically Signed   By: Franki Cabot M.D.   On: 02/10/2019 09:27   Mr Aundra Millet Breast Bx Johnella Moloney Dev 1st Lesion Image Bx Spec Mr Guide  Result Date: 02/10/2019 CLINICAL DATA:  Known RIGHT breast cancer. Recent breast MRI status post neoadjuvant chemotherapy identified a suspicious area of linear non mass enhancement within the upper inner LEFT breast for which patient presents today for MRI guided biopsy. EXAM: MRI GUIDED CORE NEEDLE BIOPSY OF THE LEFT BREAST TECHNIQUE: Multiplanar, multisequence MR imaging of the LEFT breast was performed both before and after administration of intravenous contrast. CONTRAST:  8 cc Gadavist COMPARISON:  Previous exams. FINDINGS: I met with the patient, and we discussed the procedure of MRI guided biopsy, including risks, benefits, and alternatives. Specifically, we discussed the risks of infection, bleeding, tissue injury, clip migration, and inadequate sampling. Informed, written consent was given. The usual time out protocol was  performed immediately prior to  the procedure. Using sterile technique, 1% Lidocaine, MRI guidance, and a 9 gauge vacuum assisted device, biopsy was performed of the suspicious linear non mass enhancement within the upper inner quadrant of the LEFT breast using a lateral approach. At the conclusion of the procedure, a dumbbell shaped tissue marker clip was deployed into the biopsy cavity. Follow-up 2-view mammogram was performed and dictated separately. IMPRESSION: MRI guided biopsy of the linear non mass enhancement within the upper inner quadrant of the LEFT breast. No apparent complications. Electronically Signed   By: Franki Cabot M.D.   On: 02/10/2019 09:23     ELIGIBLE FOR AVAILABLE RESEARCH PROTOCOL: Declined   ASSESSMENT: 48 y.o. [place] Park[place], [place] woman status post right breast lower outer quadrant biopsy 10/29/2018 for a clinical T2 N0, stage IIB invasive ductal carcinoma, grade 3, triple negative, with an MIB-1 of 80%  (a) CT of the chest 11/15/2018 shows possible liver metastases, but liver biopsy 11/25/2018 consistent with hemangioma  (b) bone scan 11/15/2018 shows no suspicious bone lesions  (1) neoadjuvant chemotherapy consisting of doxorubicin and cyclophosphamide in dose dense fashion x4, started 11/26/2018, completed 01/07/2019, to be followed by weekly paclitaxel and carboplatin x12 starting 01/28/2019  (2) patient desires ovarian function preservation: Zoladex started 11/08/2018  (3) definitive surgery to follow chemo: Considering bilateral mastectomy with immediate implant reconstruction  (4) adjuvant radiation to follow  (5) genetics counseling on 11/28/2018, testing was recommended, but declined by patient.   PLAN: [name] will receive her third of 12 planned cycles of carboplatin and paclitaxel today.  We do seem to be having a response and therefore we are continuing with this treatment.  I am trying to get pembrolizumab approved for her.  I thought we would have that  done back today but it has been a struggle so it will be next week when we start.  We did have an initial discussion of the possible toxicities side effects and complications of this agent today.  She is having menopausal symptoms as expected from the goserelin.  She tells me she hopes to have another child at some point in the future.  That is not a problem as far as her breast cancer is concerned but of course it is not clear whether her ovaries will survive chemotherapy.  I suggested she start tramadol if the Aleve plus Tylenol is not sufficient for pain control.  We also talked about ondansetron for late onset nausea.  She will see me again next week.  She knows to call for any other issues that may develop before then.  Milo Solana, [name], MD  02/11/19 9:43 AM Medical Oncology and Hematology [facility] [address] [place], [place] [address] Tel. [phone]    Fax. [phone]   I, Wilburn Mylar, am acting as scribe for Dr. [name]. Gabreille Dardis.  I, Lurline Del MD, have reviewed the above documentation for accuracy and completeness, and I agree with the above.

## 2019-02-11 ENCOUNTER — Other Ambulatory Visit: Payer: Self-pay

## 2019-02-11 ENCOUNTER — Encounter: Payer: Self-pay | Admitting: *Deleted

## 2019-02-11 ENCOUNTER — Inpatient Hospital Stay: Payer: BC Managed Care – PPO

## 2019-02-11 ENCOUNTER — Inpatient Hospital Stay (HOSPITAL_BASED_OUTPATIENT_CLINIC_OR_DEPARTMENT_OTHER): Payer: BC Managed Care – PPO | Admitting: Oncology

## 2019-02-11 VITALS — BP 142/86 | HR 98 | Temp 98.0°F | Resp 19 | Ht 62.0 in | Wt 176.1 lb

## 2019-02-11 DIAGNOSIS — Z5111 Encounter for antineoplastic chemotherapy: Secondary | ICD-10-CM | POA: Diagnosis not present

## 2019-02-11 DIAGNOSIS — Z95828 Presence of other vascular implants and grafts: Secondary | ICD-10-CM

## 2019-02-11 DIAGNOSIS — C50511 Malignant neoplasm of lower-outer quadrant of right female breast: Secondary | ICD-10-CM

## 2019-02-11 DIAGNOSIS — Z171 Estrogen receptor negative status [ER-]: Secondary | ICD-10-CM

## 2019-02-11 LAB — CBC WITH DIFFERENTIAL/PLATELET
Abs Immature Granulocytes: 0.11 10*3/uL — ABNORMAL HIGH (ref 0.00–0.07)
Basophils Absolute: 0 10*3/uL (ref 0.0–0.1)
Basophils Relative: 0 %
Eosinophils Absolute: 0.2 10*3/uL (ref 0.0–0.5)
Eosinophils Relative: 3 %
HCT: 31.7 % — ABNORMAL LOW (ref 36.0–46.0)
Hemoglobin: 9.9 g/dL — ABNORMAL LOW (ref 12.0–15.0)
Immature Granulocytes: 2 %
Lymphocytes Relative: 12 %
Lymphs Abs: 0.9 10*3/uL (ref 0.7–4.0)
MCH: 30.9 pg (ref 26.0–34.0)
MCHC: 31.2 g/dL (ref 30.0–36.0)
MCV: 99.1 fL (ref 80.0–100.0)
Monocytes Absolute: 0.4 10*3/uL (ref 0.1–1.0)
Monocytes Relative: 5 %
Neutro Abs: 5.8 10*3/uL (ref 1.7–7.7)
Neutrophils Relative %: 78 %
Platelets: 278 10*3/uL (ref 150–400)
RBC: 3.2 MIL/uL — ABNORMAL LOW (ref 3.87–5.11)
RDW: 20.6 % — ABNORMAL HIGH (ref 11.5–15.5)
WBC: 7.4 10*3/uL (ref 4.0–10.5)
nRBC: 0.3 % — ABNORMAL HIGH (ref 0.0–0.2)

## 2019-02-11 LAB — CMP (CANCER CENTER ONLY)
ALT: 20 U/L (ref 0–44)
AST: 6 U/L — ABNORMAL LOW (ref 15–41)
Albumin: 3.6 g/dL (ref 3.5–5.0)
Alkaline Phosphatase: 81 U/L (ref 38–126)
Anion gap: 9 (ref 5–15)
BUN: 14 mg/dL (ref 6–20)
CO2: 28 mmol/L (ref 22–32)
Calcium: 9.1 mg/dL (ref 8.9–10.3)
Chloride: 103 mmol/L (ref 98–111)
Creatinine: 0.73 mg/dL (ref 0.44–1.00)
GFR, Est AFR Am: >60 mL/min (ref >60)
GFR, Estimated: >60 mL/min (ref >60)
Glucose, Bld: 94 mg/dL (ref 70–99)
Potassium: 4.1 mmol/L (ref 3.5–5.1)
Sodium: 140 mmol/L (ref 135–145)
Total Bilirubin: 0.3 mg/dL (ref 0.3–1.2)
Total Protein: 6.1 g/dL — ABNORMAL LOW (ref 6.5–8.1)

## 2019-02-11 MED ORDER — DIPHENHYDRAMINE HCL 50 MG/ML IJ SOLN
INTRAMUSCULAR | Status: AC
  Filled 2019-02-11: qty 1

## 2019-02-11 MED ORDER — PALONOSETRON HCL INJECTION 0.25 MG/5ML
0.2500 mg | Freq: Once | INTRAVENOUS | Status: AC
  Administered 2019-02-11: 0.25 mg via INTRAVENOUS

## 2019-02-11 MED ORDER — DEXAMETHASONE SODIUM PHOSPHATE 10 MG/ML IJ SOLN
10.0000 mg | Freq: Once | INTRAMUSCULAR | Status: AC
  Administered 2019-02-11: 10 mg via INTRAVENOUS

## 2019-02-11 MED ORDER — DIPHENHYDRAMINE HCL 50 MG/ML IJ SOLN
25.0000 mg | Freq: Once | INTRAMUSCULAR | Status: AC
  Administered 2019-02-11: 25 mg via INTRAVENOUS

## 2019-02-11 MED ORDER — SODIUM CHLORIDE 0.9% FLUSH
10.0000 mL | INTRAVENOUS | Status: DC | PRN
  Administered 2019-02-11: 10 mL
  Filled 2019-02-11: qty 10

## 2019-02-11 MED ORDER — DEXAMETHASONE SODIUM PHOSPHATE 10 MG/ML IJ SOLN
INTRAMUSCULAR | Status: AC
  Filled 2019-02-11: qty 1

## 2019-02-11 MED ORDER — SODIUM CHLORIDE 0.9 % IV SOLN
80.0000 mg/m2 | Freq: Once | INTRAVENOUS | Status: AC
  Administered 2019-02-11: 144 mg via INTRAVENOUS
  Filled 2019-02-11: qty 24

## 2019-02-11 MED ORDER — SODIUM CHLORIDE 0.9 % IV SOLN
Freq: Once | INTRAVENOUS | Status: AC
  Administered 2019-02-11: 10:00:00 via INTRAVENOUS
  Filled 2019-02-11: qty 250

## 2019-02-11 MED ORDER — FAMOTIDINE IN NACL 20-0.9 MG/50ML-% IV SOLN
INTRAVENOUS | Status: AC
  Filled 2019-02-11: qty 50

## 2019-02-11 MED ORDER — HEPARIN SOD (PORK) LOCK FLUSH 100 UNIT/ML IV SOLN
500.0000 [IU] | Freq: Once | INTRAVENOUS | Status: AC | PRN
  Administered 2019-02-11: 500 [IU]
  Filled 2019-02-11: qty 5

## 2019-02-11 MED ORDER — SODIUM CHLORIDE 0.9 % IV SOLN: 10.0000 mg | Freq: Once | INTRAVENOUS | Status: DC

## 2019-02-11 MED ORDER — PALONOSETRON HCL INJECTION 0.25 MG/5ML
INTRAVENOUS | Status: AC
  Filled 2019-02-11: qty 5

## 2019-02-11 MED ORDER — SODIUM CHLORIDE 0.9% FLUSH
10.0000 mL | INTRAVENOUS | Status: DC | PRN
  Administered 2019-02-11: 10 mL via INTRAVENOUS
  Filled 2019-02-11: qty 10

## 2019-02-11 MED ORDER — SODIUM CHLORIDE 0.9 % IV SOLN
242.4000 mg | Freq: Once | INTRAVENOUS | Status: AC
  Administered 2019-02-11: 240 mg via INTRAVENOUS
  Filled 2019-02-11: qty 24

## 2019-02-11 MED ORDER — FAMOTIDINE IN NACL 20-0.9 MG/50ML-% IV SOLN
20.0000 mg | Freq: Once | INTRAVENOUS | Status: AC
  Administered 2019-02-11: 11:00:00 20 mg via INTRAVENOUS

## 2019-02-11 MED ORDER — ONDANSETRON HCL 8 MG PO TABS: 8.0000 mg | ORAL_TABLET | Freq: Three times a day (TID) | ORAL | 0 refills | Status: DC | PRN

## 2019-02-11 NOTE — Patient Instructions (Signed)
[facility] °Discharge Instructions for Patients Receiving Chemotherapy ° °Today you received the following chemotherapy agents Taxol; Carboplatin ° °To help prevent nausea and vomiting after your treatment, we encourage you to take your nausea medication as directed °  °If you develop nausea and vomiting that is not controlled by your nausea medication, call the clinic.  ° °BELOW ARE SYMPTOMS THAT SHOULD BE REPORTED IMMEDIATELY: °· *FEVER GREATER THAN 100.5 F °· *CHILLS WITH OR WITHOUT FEVER °· NAUSEA AND VOMITING THAT IS NOT CONTROLLED WITH YOUR NAUSEA MEDICATION °· *UNUSUAL SHORTNESS OF BREATH °· *UNUSUAL BRUISING OR BLEEDING °· TENDERNESS IN MOUTH AND THROAT WITH OR WITHOUT PRESENCE OF ULCERS °· *URINARY PROBLEMS °· *BOWEL PROBLEMS °· UNUSUAL RASH °Items with * indicate a potential emergency and should be followed up as soon as possible. ° °Feel free to call the clinic should you have any questions or concerns. The clinic phone number is [phone]. ° °Please show the CHEMO ALERT CARD at check-in to the Emergency Department and triage nurse. ° ° °

## 2019-02-11 NOTE — Addendum Note (Signed)
Addended by: Chauncey Cruel on: 02/11/2019 09:48 AM   Modules accepted: Orders

## 2019-02-11 NOTE — Addendum Note (Signed)
Addended by: Lurline Del C on: 02/11/2019 10:00 AM   Modules accepted: Orders

## 2019-02-18 ENCOUNTER — Inpatient Hospital Stay: Payer: BC Managed Care – PPO

## 2019-02-18 ENCOUNTER — Other Ambulatory Visit: Payer: Self-pay

## 2019-02-18 ENCOUNTER — Other Ambulatory Visit: Payer: BLUE CROSS/BLUE SHIELD

## 2019-02-18 ENCOUNTER — Other Ambulatory Visit: Payer: Self-pay | Admitting: *Deleted

## 2019-02-18 ENCOUNTER — Inpatient Hospital Stay (HOSPITAL_BASED_OUTPATIENT_CLINIC_OR_DEPARTMENT_OTHER): Payer: BC Managed Care – PPO | Admitting: Oncology

## 2019-02-18 VITALS — BP 148/82 | HR 107 | Temp 97.3°F | Resp 18 | Ht 62.0 in | Wt 178.6 lb

## 2019-02-18 VITALS — HR 94

## 2019-02-18 DIAGNOSIS — C50511 Malignant neoplasm of lower-outer quadrant of right female breast: Secondary | ICD-10-CM | POA: Diagnosis not present

## 2019-02-18 DIAGNOSIS — Z5111 Encounter for antineoplastic chemotherapy: Secondary | ICD-10-CM | POA: Diagnosis not present

## 2019-02-18 DIAGNOSIS — Z171 Estrogen receptor negative status [ER-]: Secondary | ICD-10-CM

## 2019-02-18 DIAGNOSIS — Z95828 Presence of other vascular implants and grafts: Secondary | ICD-10-CM

## 2019-02-18 DIAGNOSIS — R52 Pain, unspecified: Secondary | ICD-10-CM | POA: Diagnosis not present

## 2019-02-18 LAB — CBC WITH DIFFERENTIAL/PLATELET
Abs Immature Granulocytes: 0.23 10*3/uL — ABNORMAL HIGH (ref 0.00–0.07)
Basophils Absolute: 0 10*3/uL (ref 0.0–0.1)
Basophils Relative: 0 %
Eosinophils Absolute: 0.1 10*3/uL (ref 0.0–0.5)
Eosinophils Relative: 1 %
HCT: 33.4 % — ABNORMAL LOW (ref 36.0–46.0)
Hemoglobin: 10.4 g/dL — ABNORMAL LOW (ref 12.0–15.0)
Immature Granulocytes: 5 %
Lymphocytes Relative: 18 %
Lymphs Abs: 0.9 10*3/uL (ref 0.7–4.0)
MCH: 31.4 pg (ref 26.0–34.0)
MCHC: 31.1 g/dL (ref 30.0–36.0)
MCV: 100.9 fL — ABNORMAL HIGH (ref 80.0–100.0)
Monocytes Absolute: 0.3 10*3/uL (ref 0.1–1.0)
Monocytes Relative: 5 %
Neutro Abs: 3.5 10*3/uL (ref 1.7–7.7)
Neutrophils Relative %: 71 %
Platelets: 230 10*3/uL (ref 150–400)
RBC: 3.31 MIL/uL — ABNORMAL LOW (ref 3.87–5.11)
RDW: 20.6 % — ABNORMAL HIGH (ref 11.5–15.5)
WBC: 5 10*3/uL (ref 4.0–10.5)
nRBC: 0.8 % — ABNORMAL HIGH (ref 0.0–0.2)

## 2019-02-18 LAB — COMPREHENSIVE METABOLIC PANEL
ALT: 26 U/L (ref 0–44)
AST: 7 U/L — ABNORMAL LOW (ref 15–41)
Albumin: 3.5 g/dL (ref 3.5–5.0)
Alkaline Phosphatase: 95 U/L (ref 38–126)
Anion gap: 12 (ref 5–15)
BUN: 14 mg/dL (ref 6–20)
CO2: 26 mmol/L (ref 22–32)
Calcium: 8.5 mg/dL — ABNORMAL LOW (ref 8.9–10.3)
Chloride: 105 mmol/L (ref 98–111)
Creatinine, Ser: 0.72 mg/dL (ref 0.44–1.00)
GFR calc Af Amer: >60 mL/min (ref >60)
GFR calc non Af Amer: >60 mL/min (ref >60)
Glucose, Bld: 107 mg/dL — ABNORMAL HIGH (ref 70–99)
Potassium: 3.5 mmol/L (ref 3.5–5.1)
Sodium: 143 mmol/L (ref 135–145)
Total Bilirubin: 0.2 mg/dL — ABNORMAL LOW (ref 0.3–1.2)
Total Protein: 6 g/dL — ABNORMAL LOW (ref 6.5–8.1)

## 2019-02-18 LAB — TSH: TSH: 1.904 u[IU]/mL (ref 0.308–3.960)

## 2019-02-18 MED ORDER — FLUCONAZOLE 100 MG PO TABS: 100.0000 mg | ORAL_TABLET | Freq: Every day | ORAL | 1 refills | Status: DC

## 2019-02-18 MED ORDER — PALONOSETRON HCL INJECTION 0.25 MG/5ML
0.2500 mg | Freq: Once | INTRAVENOUS | Status: AC
  Administered 2019-02-18: 0.25 mg via INTRAVENOUS

## 2019-02-18 MED ORDER — TRAMADOL HCL 50 MG PO TABS: 50.0000 mg | ORAL_TABLET | Freq: Four times a day (QID) | ORAL | 0 refills | Status: DC | PRN

## 2019-02-18 MED ORDER — DIPHENHYDRAMINE HCL 50 MG/ML IJ SOLN
INTRAMUSCULAR | Status: AC
  Filled 2019-02-18: qty 1

## 2019-02-18 MED ORDER — HEPARIN SOD (PORK) LOCK FLUSH 100 UNIT/ML IV SOLN
500.0000 [IU] | Freq: Once | INTRAVENOUS | Status: AC | PRN
  Administered 2019-02-18: 13:00:00 500 [IU]
  Filled 2019-02-18: qty 5

## 2019-02-18 MED ORDER — DEXAMETHASONE SODIUM PHOSPHATE 10 MG/ML IJ SOLN
INTRAMUSCULAR | Status: AC
  Filled 2019-02-18: qty 1

## 2019-02-18 MED ORDER — SODIUM CHLORIDE 0.9 % IV SOLN
80.0000 mg/m2 | Freq: Once | INTRAVENOUS | Status: AC
  Administered 2019-02-18: 144 mg via INTRAVENOUS
  Filled 2019-02-18: qty 24

## 2019-02-18 MED ORDER — DIPHENHYDRAMINE HCL 50 MG/ML IJ SOLN
25.0000 mg | Freq: Once | INTRAMUSCULAR | Status: AC
  Administered 2019-02-18: 25 mg via INTRAVENOUS

## 2019-02-18 MED ORDER — SODIUM CHLORIDE 0.9 % IV SOLN
Freq: Once | INTRAVENOUS | Status: AC
  Administered 2019-02-18: 10:00:00 via INTRAVENOUS
  Filled 2019-02-18: qty 250

## 2019-02-18 MED ORDER — SODIUM CHLORIDE 0.9 % IV SOLN
240.0000 mg | Freq: Once | INTRAVENOUS | Status: AC
  Administered 2019-02-18: 240 mg via INTRAVENOUS
  Filled 2019-02-18: qty 24

## 2019-02-18 MED ORDER — DEXAMETHASONE SODIUM PHOSPHATE 10 MG/ML IJ SOLN
10.0000 mg | Freq: Once | INTRAMUSCULAR | Status: AC
  Administered 2019-02-18: 10 mg via INTRAVENOUS

## 2019-02-18 MED ORDER — FAMOTIDINE IN NACL 20-0.9 MG/50ML-% IV SOLN
20.0000 mg | Freq: Once | INTRAVENOUS | Status: AC
  Administered 2019-02-18: 20 mg via INTRAVENOUS

## 2019-02-18 MED ORDER — FAMOTIDINE IN NACL 20-0.9 MG/50ML-% IV SOLN
INTRAVENOUS | Status: AC
  Filled 2019-02-18: qty 50

## 2019-02-18 MED ORDER — SODIUM CHLORIDE 0.9% FLUSH
10.0000 mL | INTRAVENOUS | Status: DC | PRN
  Administered 2019-02-18: 10 mL
  Filled 2019-02-18: qty 10

## 2019-02-18 MED ORDER — PALONOSETRON HCL INJECTION 0.25 MG/5ML
INTRAVENOUS | Status: AC
  Filled 2019-02-18: qty 5

## 2019-02-18 MED ORDER — SODIUM CHLORIDE 0.9% FLUSH
10.0000 mL | INTRAVENOUS | Status: DC | PRN
  Administered 2019-02-18: 10 mL via INTRAVENOUS
  Filled 2019-02-18: qty 10

## 2019-02-18 NOTE — Telephone Encounter (Signed)
VM left by pt stating " Dr Jana Hakim gave me a prescription for Tramadol today - but it was only for 7 days - could we do more ?"  Noted MD wrote for 120 tablets- this RN called pt's pharmacy and was informed per pt's insurance 7 days amount is what is allowed by her insurance policy. Pharmacist stated he could faxed a dispense amount override for authorization per insurance. Fax number given for this RN's pod.  Call placed to pt - obtained identified VM- message left per above.

## 2019-02-18 NOTE — Addendum Note (Signed)
Addended by: Chauncey Cruel on: 02/18/2019 05:08 PM   Modules accepted: Orders

## 2019-02-18 NOTE — Patient Instructions (Signed)
[facility] °Discharge Instructions for Patients Receiving Chemotherapy ° °Today you received the following chemotherapy agents Taxol; Carboplatin ° °To help prevent nausea and vomiting after your treatment, we encourage you to take your nausea medication as directed °  °If you develop nausea and vomiting that is not controlled by your nausea medication, call the clinic.  ° °BELOW ARE SYMPTOMS THAT SHOULD BE REPORTED IMMEDIATELY: °· *FEVER GREATER THAN 100.5 F °· *CHILLS WITH OR WITHOUT FEVER °· NAUSEA AND VOMITING THAT IS NOT CONTROLLED WITH YOUR NAUSEA MEDICATION °· *UNUSUAL SHORTNESS OF BREATH °· *UNUSUAL BRUISING OR BLEEDING °· TENDERNESS IN MOUTH AND THROAT WITH OR WITHOUT PRESENCE OF ULCERS °· *URINARY PROBLEMS °· *BOWEL PROBLEMS °· UNUSUAL RASH °Items with * indicate a potential emergency and should be followed up as soon as possible. ° °Feel free to call the clinic should you have any questions or concerns. The clinic phone number is [phone]. ° °Please show the CHEMO ALERT CARD at check-in to the Emergency Department and triage nurse. ° ° °

## 2019-02-18 NOTE — Progress Notes (Signed)
[place]  [phone] [phone]    ID: Brittany Rodgers DOB: 1971-05-05  MR#: [id number]  TKP#:[id number]  Patient Care Team: Brittany Morale, MD as PCP - Brittany Draft, MD as Consulting Physician (General Surgery) Brittany Rodgers, Brittany Dad, MD as Consulting Physician (Oncology) Brittany Gibson, MD as Attending Physician (Radiation Oncology) Brittany Mayer, MD as Consulting Physician (Gastroenterology) Brittany Millers, MD as Consulting Physician (Obstetrics and Gynecology) Brittany Germany, RN as Oncology Nurse Navigator Brittany Kaufmann, RN as Oncology Nurse Navigator Brittany Limbo, MD as Consulting Physician (Plastic Surgery) OTHER MD: Dr. Siri Rodgers (urology)    CHIEF COMPLAINT: Triple negative breast cancer  CURRENT TREATMENT: Neoadjuvant chemotherapy, pembrolizumab, goserelin   INTERVAL HISTORY: Brittany Rodgers returns today for follow-up and treatment of her triple negative breast cancer.  She continues on weekly paclitaxel and carboplatin x12. Today is day 1 cycle 4. She reports intense leg and back pain, as well as swelling and throbbing pain to her right breast. This causes her to be unable to walk around and care for herself. She uses ice for relief on her breast, but she notes the throbbing keeps her awake at night. She also reports thrush, a "diaper rash," and swollen feet. She notes taking tramedol, stating "sometimes it works, sometimes it doesn't."   Her most recent echocardiogram was on 11/13/2018 and showed a ejection fraction of 60-65%.  Most recent goserelin dose was 02/04/2019. She reports hot flashes and diminished sex drive.  She underwent biopsy of a questionable enhancement in the upper-inner left breast on 02/10/2019. Pathology [phone]) showed: duct ectasia, microglandular adenosis, and fibrocystic changes; focal pseudoangiomatous stromal hyperplasia; no malignancy detected.  We have not been able to get her  pembrolizumab approved yet.  This is still pending   REVIEW OF SYSTEMS: Brittany reports feeling miserable. She has been unable to care for herself because of the pains she's having. She reports it hurts to walk, and she has trouble sleeping. She expressed wanting to move up her surgery to "get this tumor out of me." The patient denies unusual headaches, visual changes, nausea, vomiting, stiff neck, dizziness, or gait imbalance. There has been no cough, phlegm production, or pleurisy, no chest pain or pressure, and no change in bowel or bladder habits. The patient denies fever, rash, bleeding, unexplained fatigue or unexplained weight loss. A detailed review of systems was otherwise entirely negative.   HISTORY OF CURRENT ILLNESS: From the original intake note:  Brittany Rodgers presented with a palpable right breast lump and associated tenderness for approximately 1 week. She called her doctor immediately following  She underwent unilateral right diagnostic mammography with tomography and right breast ultrasonography at [facility] on 10/28/2018 showing: Breast Density Category C. Radiopaque BB was placed at the site of the patient's palpable lump in the central right breast. An irregular hyperdense mass with associated increased trabecular thickening is seen in the deep central right breast. On physical exam, a firm, fixed mass in the central right breast is palpated. Target ultrasound is performed, showing and irregular hypoechoic mass at the 8 o'clock position 3 cm from the nipple. It measures 3.6 x 2.9 x 2.3 cm. There is associated peripheral vascularity. Evaluation of the right axilla demonstrates a single prominent lymph node demonstrating 0.4 cm cortical thickening. The remaining lymph nodes ave normal appearance with a thin, symmetric cortex.   Accordingly on 10/29/2018 she proceeded to biopsy of the right breast area in question. The pathology from this procedure showed (  [id number]): invasive  ductal carcinoma, grade III. Prognostic indicators significant for: estrogen receptor, 0% negative and progesterone receptor, 0% negative. Proliferation marker Ki67 at 80%. HER2 negative (1+) by immunohistochemistry.  Biopsy of the right axilla was performed on the same day. The pathology from this procedure showed ([id number]): lymph node, negative for carcinoma.  This was felt to be concordant  The patient's subsequent history is as detailed below.   PAST MEDICAL HISTORY: Past Medical History:  Diagnosis Date  . Chronic cough   . Family history of breast cancer   . GERD (gastroesophageal reflux disease)   . Hyperlipidemia   . IBS (irritable bowel syndrome)    sees Dr. Silvano Rusk   . Routine gynecological examination    sees Dr. Freda Munro     PAST SURGICAL HISTORY: Past Surgical History:  Procedure Laterality Date  . CESAREAN SECTION    . PORTACATH PLACEMENT Right 11/14/2018   Procedure: INSERTION PORT-A-CATH WITH ULTRASOUND;  Surgeon: Rolm Bookbinder, MD;  Location: [facility];  Service: General;  Laterality: Right;  . ROOT CANAL       FAMILY HISTORY: Family History  Problem Relation Age of Onset  . Hypertension Other   . Breast cancer Other        mat first cousin's daughter  . Breast cancer Maternal Aunt   . Hypertension Mother   . Breast cancer Maternal Aunt   . Breast cancer Maternal Aunt    Brittany Rodgers's father died from an unknown cause at age 81. [name] died from unknown health complications at age 23. The patient has 4 sisters. Brittany Rodgers has 3 maternal aunts that have been diagnosed with breast cancer. Patient denies anyone in her family having ovarian, prostate, or pancreatic cancer.    GYNECOLOGIC HISTORY:  No LMP recorded. 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  Her husband, Brittany Rodgers, is a Administrator. The patient has two children, Brittany Rodgers and Brittany Rodgers, from another relationship. Brittany Rodgers is 37, lives in [place]. Brittany Rodgers is 61, lives in [place], and is in [employer].  The patient's husband [name] has a child, Brittany Rodgers, from another relationship; she lives in [place] and is in school. The patient has 5 grandchildren. She attends [employer].   ADVANCED DIRECTIVES: Brittany's husband, [name], is automatically her healthcare power of attorney.     HEALTH MAINTENANCE: Social History   Tobacco Use  . Smoking status: Never Smoker  . Smokeless tobacco: Never Used  Substance Use Topics  . Alcohol use: No    Alcohol/week: 0.0 standard drinks  . Drug use: No    Colonoscopy: yes  PAP: 02/2018  Bone density: no   Allergies  Allergen Reactions  . Barbiturates Other (See Comments)    Keeps pt awake  . Sulfonamide Derivatives Itching    Current Outpatient Medications  Medication Sig Dispense Refill  . atorvastatin (LIPITOR) 20 MG tablet Take 1 tablet (20 mg total) by mouth daily. 90 tablet 3  . cyclobenzaprine (FLEXERIL) 10 MG tablet TAKE 1 TABLET BY MOUTH THREE TIMES DAILY AS NEEDED FOR MUSCLE SPASMS 90 tablet 0  . dexamethasone (DECADRON) 4 MG tablet TAKE 2 TABLETS BY MOUTH ONCE DAILY ON THE DAY AFTER CHEMOTHERAPY AND THEN TAKE 2 TABLETS TWICE DAILY FOR 2 DAYS. TAKE WITH FOOD. 30 tablet 0  . fluconazole (DIFLUCAN)  200 MG tablet Take 1 tablet by mouth daily 7 tablet 0  . lidocaine-prilocaine (EMLA) cream Apply to affected area once 30 g 3  . loratadine (CLARITIN) 10 MG tablet Take 1 tablet (10 mg total) by mouth daily. 60 tablet 0  . LORazepam (ATIVAN) 0.5 MG tablet TAKE 1 TABLET BY MOUTH AT BEDTIME AS NEEDED (  NAUSEA  OR  VOMITING) 30 tablet 0  . magic mouthwash SOLN Take 5 mLs by mouth 3 (three) times daily as needed for mouth pain. 240 mL 0  . Omeprazole-Sodium Bicarbonate (ZEGERID) 20-1100 MG CAPS capsule Take 1 capsule by  mouth daily before breakfast. 90 each 3  . ondansetron (ZOFRAN) 8 MG tablet Take 1 tablet (8 mg total) by mouth every 8 (eight) hours as needed for nausea or vomiting. 20 tablet 0  . polyethylene glycol (MIRALAX / GLYCOLAX) packet Take 17 g by mouth as needed. (Patient taking differently: Take 17 g by mouth daily as needed (constipation). Mix in 8 oz liquid and drink) 100 each 5  . potassium chloride (K-DUR) 10 MEQ tablet TAKE 1 TABLET BY MOUTH ONCE DAILY 90 tablet 3  . prochlorperazine (COMPAZINE) 10 MG tablet Take 1 tablet (10 mg total) by mouth every 6 (six) hours as needed (Nausea or vomiting). 30 tablet 1  . traMADol (ULTRAM) 50 MG tablet Take 1-2 tablets (50-100 mg total) by mouth every 6 (six) hours as needed for moderate pain. 20 tablet 0  . valACYclovir (VALTREX) 500 MG tablet Take 1 tablet (500 mg total) by mouth 2 (two) times daily. 60 tablet 0   No current facility-administered medications for this visit.      OBJECTIVE: Young African-American woman wearing a mask Vitals:   02/18/19 0840  BP: (!) 148/82  Pulse: (!) 107  Resp: 18  Temp: (!) 97.3 F (36.3 C)  SpO2: 100%     Body mass index is 32.67 kg/m.   Wt Readings from Last 3 Encounters:  02/18/19 178 lb 9.6 oz (81 kg)  02/11/19 176 lb 1.6 oz (79.9 kg)  02/04/19 174 lb (78.9 kg)   ECOG FS:2 - Symptomatic, <50% confined to bed  Sclerae unicteric, EOMs intact No cervical or supraclavicular adenopathy Lungs no rales or rhonchi Heart regular rate and rhythm Abd soft, nontender, positive bowel sounds MSK no focal spinal tenderness, no upper extremity lymphedema Neuro: nonfocal, well oriented, appropriate affect Breasts: The right breast is essentially unchanged.  The mass is pointing in the lower outer quadrant laterally and certainly could breakthrough and ulcerate in the near future; the left breast is status post recent biopsy with no significant findings.  LAB RESULTS:  CMP     Component Value Date/Time   NA  140 02/11/2019 0859   K 4.1 02/11/2019 0859   CL 103 02/11/2019 0859   CO2 28 02/11/2019 0859   GLUCOSE 94 02/11/2019 0859   BUN 14 02/11/2019 0859   CREATININE 0.73 02/11/2019 0859   CALCIUM 9.1 02/11/2019 0859   PROT 6.1 (L) 02/11/2019 0859   ALBUMIN 3.6 02/11/2019 0859   AST 6 (L) 02/11/2019 0859   ALT 20 02/11/2019 0859   ALKPHOS 81 02/11/2019 0859   BILITOT 0.3 02/11/2019 0859   GFRNONAA >60 02/11/2019 0859   GFRAA >60 02/11/2019 0859    No results found for: TOTALPROTELP, ALBUMINELP, A1GS, A2GS, BETS, BETA2SER, GAMS, MSPIKE, SPEI  No results found for: KPAFRELGTCHN, LAMBDASER, KAPLAMBRATIO  Lab Results  Component Value Date   WBC 5.0 02/18/2019   NEUTROABS  3.5 02/18/2019   HGB 10.4 (L) 02/18/2019   HCT 33.4 (L) 02/18/2019   MCV 100.9 (H) 02/18/2019   PLT 230 02/18/2019    _0 @  No results found for: LABCA2  No components found for: TDVVOH607  No results for input(s): INR in the last 168 hours.  No results found for: LABCA2  No results found for: PXT062  No results found for: IRS854  No results found for: OEV035  No results found for: CA2729  No components found for: HGQUANT  No results found for: CEA1 / No results found for: CEA1   No results found for: AFPTUMOR  No results found for: CHROMOGRNA  No results found for: PSA1  Appointment on 02/18/2019  Component Date Value Ref Range Status  . WBC 02/18/2019 5.0  4.0 - 10.5 K/uL Final  . RBC 02/18/2019 3.31* 3.87 - 5.11 MIL/uL Final  . Hemoglobin 02/18/2019 10.4* 12.0 - 15.0 g/dL Final  . HCT 02/18/2019 33.4* 36.0 - 46.0 % Final  . MCV 02/18/2019 100.9* 80.0 - 100.0 fL Final  . MCH 02/18/2019 31.4  26.0 - 34.0 pg Final  . MCHC 02/18/2019 31.1  30.0 - 36.0 g/dL Final  . RDW 02/18/2019 20.6* 11.5 - 15.5 % Final  . Platelets 02/18/2019 230  150 - 400 K/uL Final  . nRBC 02/18/2019 0.8* 0.0 - 0.2 % Final  . Neutrophils Relative % 02/18/2019 71  % Final  . Neutro Abs 02/18/2019 3.5  1.7  - 7.7 K/uL Final  . Lymphocytes Relative 02/18/2019 18  % Final  . Lymphs Abs 02/18/2019 0.9  0.7 - 4.0 K/uL Final  . Monocytes Relative 02/18/2019 5  % Final  . Monocytes Absolute 02/18/2019 0.3  0.1 - 1.0 K/uL Final  . Eosinophils Relative 02/18/2019 1  % Final  . Eosinophils Absolute 02/18/2019 0.1  0.0 - 0.5 K/uL Final  . Basophils Relative 02/18/2019 0  % Final  . Basophils Absolute 02/18/2019 0.0  0.0 - 0.1 K/uL Final  . Immature Granulocytes 02/18/2019 5  % Final  . Abs Immature Granulocytes 02/18/2019 0.23* 0.00 - 0.07 K/uL Final   Performed at [facility], [address] [place], [place] [address]    (this displays the last labs from the last 3 days)  No results found for: TOTALPROTELP, ALBUMINELP, A1GS, A2GS, BETS, BETA2SER, GAMS, MSPIKE, SPEI (this displays SPEP labs)  No results found for: KPAFRELGTCHN, LAMBDASER, KAPLAMBRATIO (kappa/lambda light chains)  No results found for: HGBA, HGBA2QUANT, HGBFQUANT, HGBSQUAN (Hemoglobinopathy evaluation)   No results found for: LDH  No results found for: IRON, TIBC, IRONPCTSAT (Iron and TIBC)  No results found for: FERRITIN  Urinalysis    Component Value Date/Time   COLORURINE STRAW (A) 12/06/2018 1014   APPEARANCEUR CLEAR 12/06/2018 1014   LABSPEC 1.009 12/06/2018 1014   PHURINE 7.0 12/06/2018 1014   GLUCOSEU NEGATIVE 12/06/2018 1014   HGBUR SMALL (A) 12/06/2018 1014   BILIRUBINUR NEGATIVE 12/06/2018 1014   BILIRUBINUR negative 05/31/2018 1011   KETONESUR NEGATIVE 12/06/2018 1014   PROTEINUR NEGATIVE 12/06/2018 1014   UROBILINOGEN 0.2 05/31/2018 1011   UROBILINOGEN 0.2 05/18/2010 1042   NITRITE NEGATIVE 12/06/2018 1014   LEUKOCYTESUR NEGATIVE 12/06/2018 1014     STUDIES:  Mr Breast Bilateral W Wo Contrast Inc Cad  Result Date: 01/31/2019 CLINICAL DATA:  48 year old female for follow-up of known triple negative RIGHT breast cancer diagnosed in February 2020, currently on neoadjuvant  chemotherapy. LABS:  None performed today EXAM: BILATERAL BREAST MRI WITH  AND WITHOUT CONTRAST TECHNIQUE: Multiplanar, multisequence MR images of both breasts were obtained prior to and following the intravenous administration of 8 ml of Gadavist Three-dimensional MR images were rendered by post-processing of the original MR data on an independent workstation. The three-dimensional MR images were interpreted, and findings are reported in the following complete MRI report for this study. Three dimensional images were evaluated at the independent DynaCad workstation COMPARISON:  11/12/2018 breast MRI and prior studies FINDINGS: Breast composition: b. Scattered fibroglandular tissue. Background parenchymal enhancement: Mild Right breast: The discrete irregular mass (biopsy-proven malignancy) within the central and UPPER RIGHT breast containing biopsy clip artifact has enlarged now measuring 10 x 7.5 x 8 cm (transverse x AP x CC), previously 5.4 x 4.6 x 3.9 cm. This mass now extends to the skin of the UPPER-OUTER RIGHT breast and continues to have a large contact area with the RIGHT pectoralis muscle. Adjacent non masslike enhancement is less prominent when compared to the prior study but still persists, primarily along the anterior and medial aspect of the enlarging biopsy-proven malignancy. The discrete malignancy and non masslike enhancement measures 12.5 x 8 x 8 cm. Left breast: A subtle 1.2 cm area of linear enhancement within the UPPER INNER LEFT breast (series 6, image 83) at mid depth is noted. No other suspicious abnormalities noted. Lymph nodes: An unchanged 3 x 7 mm RIGHT internal mammary lymph node (6:61-67) is noted. Prominent RIGHT axillary lymph nodes now have a normal appearance. No abnormal LEFT axillary lymph nodes identified. Ancillary findings:  None. IMPRESSION: 1. Enlarging biopsy-proven RIGHT breast malignancy, now measuring 10 x 7.5 x 8 cm, previously 5.4 x 4.6 x 3.9 cm and now extends to the  UPPER-OUTER RIGHT breast skin. Continued contact with the RIGHT pectoralis muscle. 2. Unchanged 7 mm RIGHT internal mammary lymph node. Previously identified prominent RIGHT axillary lymph nodes now have a normal appearance. 3. 1.2 cm linear non masslike enhancement within the UPPER INNER LEFT breast - DCIS is not excluded. Recommend MR biopsy if this would change treatment. RECOMMENDATION: MR guided LEFT breast biopsy as clinically indicated. BI-RADS CATEGORY  4: Suspicious. Electronically Signed   By: Margarette Canada M.D.   On: 01/31/2019 13:19   Mm Clip Placement Left  Result Date: 02/10/2019 CLINICAL DATA:  Status post MRI guided biopsy for suspicious non mass enhancement within the upper inner quadrant of the LEFT breast. EXAM: DIAGNOSTIC LEFT MAMMOGRAM POST MRI BIOPSY COMPARISON:  Previous exam(s). FINDINGS: Mammographic images were obtained following MRI guided biopsy of linear non mass enhancement within the upper inner quadrant of the LEFT breast. Dumbbell-shaped clip is appropriately positioned. IMPRESSION: Dumbbell-shaped clip is appropriately positioned within the upper inner quadrant of the LEFT breast. Final Assessment: Post Procedure Mammograms for Marker Placement Electronically Signed   By: Franki Cabot M.D.   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 This was found to be concordant by Dr. Franki Cabot. Pathology results were discussed with the patient by telephone. The patient reported doing well after the biopsy with tenderness at the site. Post biopsy instructions and care were reviewed and questions were answered. The patient was encouraged to call [facility] for any additional  concerns. The patient has a recent diagnosis of  RIGHT breast cancer and should follow her outlined treatment plan. The patient was instructed to return for a breast MRI in 6 months, per protocol. Pathology results reported by Terie Purser, RN on 02/13/2019. Electronically Signed   By: Franki Cabot M.D.   On: 02/13/2019 15:07   Result Date: 02/13/2019 CLINICAL DATA:  Known RIGHT breast cancer. Recent breast MRI status post neoadjuvant chemotherapy identified a suspicious area of linear non mass enhancement within the upper inner LEFT breast for which patient presents today for MRI guided biopsy. EXAM: MRI GUIDED CORE NEEDLE BIOPSY OF THE LEFT BREAST TECHNIQUE: Multiplanar, multisequence MR imaging of the LEFT breast was performed both before and after administration of intravenous contrast. CONTRAST:  8 cc Gadavist COMPARISON:  Previous exams. FINDINGS: I met with the patient, and we discussed the procedure of MRI guided biopsy, including risks, benefits, and alternatives. Specifically, we discussed the risks of infection, bleeding, tissue injury, clip migration, and inadequate sampling. Informed, written consent was given. The usual time out protocol was performed immediately prior to the procedure. Using sterile technique, 1% Lidocaine, MRI guidance, and a 9 gauge vacuum assisted device, biopsy was performed of the suspicious linear non mass enhancement within the upper inner quadrant of the LEFT breast using a lateral approach. At the conclusion of the procedure, a dumbbell shaped tissue marker clip was deployed into the biopsy cavity. Follow-up 2-view mammogram was performed and dictated separately. IMPRESSION: MRI guided biopsy of the linear non mass enhancement within the upper inner quadrant of the LEFT breast. No apparent complications. Electronically Signed: By: Franki Cabot M.D. On: 02/10/2019 09:23     ELIGIBLE FOR AVAILABLE RESEARCH PROTOCOL: Declined   ASSESSMENT: 48 y.o. Brittany Rodgers, Brittany Rodgers woman  status post right breast lower outer quadrant biopsy 10/29/2018 for a clinical T2 N0, stage IIB invasive ductal carcinoma, grade 3, triple negative, with an MIB-1 of 80%  (a) CT of the chest 11/15/2018 shows possible liver metastases, but liver biopsy 11/25/2018 consistent with hemangioma  (b) bone scan 11/15/2018 shows no suspicious bone lesions  (1) neoadjuvant chemotherapy consisting of doxorubicin and cyclophosphamide in dose dense fashion x4, started 11/26/2018, completed 01/07/2019, followed by weekly paclitaxel and carboplatin x12 starting 01/28/2019  (2) patient desires ovarian function preservation: Zoladex started 11/08/2018  (3) definitive surgery to follow chemo: Considering bilateral mastectomy with immediate implant reconstruction  (4) adjuvant radiation to follow  (5) genetics counseling on 11/28/2018, testing was recommended, but declined by patient.  (6) pembrolizumab to be started as soon as approved   PLAN: [name] will proceed to the fourth of her 12 planned cycles of carboplatin and paclitaxel today.  There has been no significant change in the right breast and I am concerned that the tumor is "pointing" and may break through the skin in the near future, which would certainly complicate management.  She wants to proceed to surgery and I agree.  I have sent a note to Dr. Donne Hazel to see if we can get this done sooner rather than later.  The plan at this point is for a right modified radical on the right and prophylactic mastectomy on the left.  We have not been able to get her pembrolizumab approved and we do not yet have a definite know either so we have not been able to get it directly from the company.  Hopefully we will be able to get that done within the next few weeks.  The plan there will be to give her pembrolizumab for a minimum of  a year assuming she tolerates it well.  She has an appointment with me next week.  We will probably not treat her at that time  assuming that she will have a surgical date coming up  She knows to call for any other issues that may develop before the next visit.    Venola Castello, Brittany Dad, MD  02/18/19 9:45 AM Medical Oncology and Hematology [facility] [address] [place], [place] [address] Tel. [phone]    Fax. [phone]   I, Wilburn Mylar, am acting as scribe for Dr. Virgie Rodgers. Ty Oshima.  I, Lurline Del MD, have reviewed the above documentation for accuracy and completeness, and I agree with the above.

## 2019-02-19 ENCOUNTER — Telehealth: Payer: Self-pay | Admitting: Oncology

## 2019-02-19 NOTE — Telephone Encounter (Signed)
I left a message regarding time change on 6/2.

## 2019-02-21 ENCOUNTER — Other Ambulatory Visit: Payer: Self-pay | Admitting: Oncology

## 2019-02-21 DIAGNOSIS — C50511 Malignant neoplasm of lower-outer quadrant of right female breast: Secondary | ICD-10-CM

## 2019-02-25 ENCOUNTER — Other Ambulatory Visit: Payer: BLUE CROSS/BLUE SHIELD

## 2019-02-25 ENCOUNTER — Other Ambulatory Visit: Payer: Self-pay

## 2019-02-25 ENCOUNTER — Telehealth: Payer: Self-pay | Admitting: *Deleted

## 2019-02-25 ENCOUNTER — Encounter (HOSPITAL_BASED_OUTPATIENT_CLINIC_OR_DEPARTMENT_OTHER): Payer: Self-pay | Admitting: *Deleted

## 2019-02-25 ENCOUNTER — Ambulatory Visit: Payer: BLUE CROSS/BLUE SHIELD

## 2019-02-25 ENCOUNTER — Inpatient Hospital Stay: Payer: BC Managed Care – PPO

## 2019-02-25 ENCOUNTER — Inpatient Hospital Stay: Payer: BC Managed Care – PPO | Attending: Adult Health

## 2019-02-25 ENCOUNTER — Other Ambulatory Visit: Payer: Self-pay | Admitting: General Surgery

## 2019-02-25 DIAGNOSIS — C50411 Malignant neoplasm of upper-outer quadrant of right female breast: Secondary | ICD-10-CM

## 2019-02-25 NOTE — Telephone Encounter (Signed)
This RN communicated with nurse navigator per appointments scheduled for lab flush and treatment- but not an MD appointment.  Per MD review of above- pt does not need to come in unless she has symptoms that need addressing.  Appointments for today should have been canceled.  Plan per MD is for pt to proceed with surgery ASAP and then follow up in this office 3-4 weeks post surgery for further recommendations.  This RN called pt at home number- obtained identified VM- detailed message left per above.  This note will be sent to nurse navigator for communication and follow up needs.  This RN will await surgical date communication to schedule followup.

## 2019-02-26 ENCOUNTER — Telehealth: Payer: Self-pay

## 2019-02-26 ENCOUNTER — Other Ambulatory Visit: Payer: Self-pay

## 2019-02-26 MED ORDER — GABAPENTIN 300 MG PO CAPS: 300.0000 mg | ORAL_CAPSULE | Freq: Every day | ORAL | 1 refills | Status: DC

## 2019-02-26 NOTE — Telephone Encounter (Signed)
Pt lvm with c/o pain in feet. Returned call and lvm for pt to call back.

## 2019-02-26 NOTE — Telephone Encounter (Signed)
Pt called reporting pain and tenderness in her feet making it difficult for her to walk. Per Dr Jana Hakim pt to start gabapentin 300mg  qHS. Prescription sent to pt's pharmacy. Pt is aware.

## 2019-02-27 ENCOUNTER — Telehealth: Payer: Self-pay | Admitting: Oncology

## 2019-02-27 ENCOUNTER — Encounter: Payer: Self-pay | Admitting: Oncology

## 2019-02-27 NOTE — Telephone Encounter (Signed)
Received a scheduling msg to sch a f/u appt w/DR. Magrinat. An appt has been scheduled for the pt to see [name] on 7/8 at 11:30am w/labs at 11am. Lft the appt information on the pt's vm. Letter mailed.

## 2019-02-27 NOTE — Telephone Encounter (Signed)
Pt cb to confirm 7/8 appts

## 2019-02-27 NOTE — Progress Notes (Signed)
Ensure pre surgery drink and surgical soap given with instructions, pt verbalized understanding. 

## 2019-02-28 ENCOUNTER — Other Ambulatory Visit: Payer: Self-pay

## 2019-02-28 ENCOUNTER — Other Ambulatory Visit (HOSPITAL_COMMUNITY)
Admission: RE | Admit: 2019-02-28 | Discharge: 2019-02-28 | Disposition: A | Discharge status: Home / Self Care | Payer: BC Managed Care – PPO | Source: Ambulatory Visit | Attending: General Surgery | Admitting: General Surgery

## 2019-02-28 DIAGNOSIS — Z1159 Encounter for screening for other viral diseases: Secondary | ICD-10-CM | POA: Diagnosis not present

## 2019-02-28 DIAGNOSIS — Z01812 Encounter for preprocedural laboratory examination: Secondary | ICD-10-CM | POA: Diagnosis not present

## 2019-03-01 LAB — NOVEL CORONAVIRUS, NAA (HOSP ORDER, SEND-OUT TO REF LAB; TAT 18-24 HRS): SARS-CoV-2, NAA: NOT DETECTED

## 2019-03-04 ENCOUNTER — Ambulatory Visit (HOSPITAL_BASED_OUTPATIENT_CLINIC_OR_DEPARTMENT_OTHER): Payer: BC Managed Care – PPO | Admitting: Certified Registered"

## 2019-03-04 ENCOUNTER — Encounter (HOSPITAL_COMMUNITY)
Admission: RE | Admit: 2019-03-04 | Discharge: 2019-03-04 | Disposition: A | Discharge status: Home / Self Care | Payer: BC Managed Care – PPO | Source: Ambulatory Visit | Attending: General Surgery | Admitting: General Surgery

## 2019-03-04 ENCOUNTER — Encounter (HOSPITAL_BASED_OUTPATIENT_CLINIC_OR_DEPARTMENT_OTHER): Payer: Self-pay | Admitting: Anesthesiology

## 2019-03-04 ENCOUNTER — Other Ambulatory Visit: Payer: Self-pay

## 2019-03-04 ENCOUNTER — Ambulatory Visit (HOSPITAL_COMMUNITY): Payer: BC Managed Care – PPO

## 2019-03-04 ENCOUNTER — Observation Stay (HOSPITAL_BASED_OUTPATIENT_CLINIC_OR_DEPARTMENT_OTHER)
Admission: RE | Admit: 2019-03-04 | Discharge: 2019-03-06 | Disposition: A | Discharge status: Home / Self Care | Payer: BC Managed Care – PPO | Attending: General Surgery | Admitting: General Surgery

## 2019-03-04 ENCOUNTER — Encounter (HOSPITAL_COMMUNITY)
Admission: RE | Disposition: A | Discharge status: Home / Self Care | Payer: Self-pay | Source: Home / Self Care | Attending: General Surgery

## 2019-03-04 DIAGNOSIS — Z79899 Other long term (current) drug therapy: Secondary | ICD-10-CM | POA: Diagnosis not present

## 2019-03-04 DIAGNOSIS — C773 Secondary and unspecified malignant neoplasm of axilla and upper limb lymph nodes: Secondary | ICD-10-CM | POA: Insufficient documentation

## 2019-03-04 DIAGNOSIS — K219 Gastro-esophageal reflux disease without esophagitis: Secondary | ICD-10-CM | POA: Diagnosis not present

## 2019-03-04 DIAGNOSIS — Z6833 Body mass index (BMI) 33.0-33.9, adult: Secondary | ICD-10-CM | POA: Insufficient documentation

## 2019-03-04 DIAGNOSIS — I10 Essential (primary) hypertension: Secondary | ICD-10-CM | POA: Diagnosis not present

## 2019-03-04 DIAGNOSIS — E669 Obesity, unspecified: Secondary | ICD-10-CM | POA: Diagnosis not present

## 2019-03-04 DIAGNOSIS — C50411 Malignant neoplasm of upper-outer quadrant of right female breast: Secondary | ICD-10-CM

## 2019-03-04 DIAGNOSIS — E785 Hyperlipidemia, unspecified: Secondary | ICD-10-CM | POA: Insufficient documentation

## 2019-03-04 DIAGNOSIS — D630 Anemia in neoplastic disease: Secondary | ICD-10-CM | POA: Insufficient documentation

## 2019-03-04 DIAGNOSIS — Z9221 Personal history of antineoplastic chemotherapy: Secondary | ICD-10-CM | POA: Diagnosis not present

## 2019-03-04 DIAGNOSIS — C50911 Malignant neoplasm of unspecified site of right female breast: Secondary | ICD-10-CM | POA: Diagnosis present

## 2019-03-04 DIAGNOSIS — R000 Tachycardia, unspecified: Secondary | ICD-10-CM | POA: Diagnosis not present

## 2019-03-04 DIAGNOSIS — C50511 Malignant neoplasm of lower-outer quadrant of right female breast: Secondary | ICD-10-CM | POA: Diagnosis present

## 2019-03-04 DIAGNOSIS — Z803 Family history of malignant neoplasm of breast: Secondary | ICD-10-CM | POA: Diagnosis not present

## 2019-03-04 DIAGNOSIS — Z9010 Acquired absence of unspecified breast and nipple: Secondary | ICD-10-CM

## 2019-03-04 HISTORY — DX: Malignant (primary) neoplasm, unspecified: C80.1

## 2019-03-04 HISTORY — PX: MASTECTOMY W/ SENTINEL NODE BIOPSY: SHX2001

## 2019-03-04 SURGERY — MASTECTOMY WITH SENTINEL LYMPH NODE BIOPSY
Anesthesia: General | Site: Breast | Laterality: Right

## 2019-03-04 MED ORDER — BUPIVACAINE-EPINEPHRINE (PF) 0.5% -1:200000 IJ SOLN
INTRAMUSCULAR | Status: DC | PRN
  Administered 2019-03-04: 20 mL
  Administered 2019-03-04: 10 mL

## 2019-03-04 MED ORDER — CEFAZOLIN SODIUM-DEXTROSE 2-4 GM/100ML-% IV SOLN
2.0000 g | INTRAVENOUS | Status: AC
  Administered 2019-03-04: 2 g via INTRAVENOUS

## 2019-03-04 MED ORDER — DEXAMETHASONE SODIUM PHOSPHATE 10 MG/ML IJ SOLN
INTRAMUSCULAR | Status: AC
  Filled 2019-03-04: qty 3

## 2019-03-04 MED ORDER — PANTOPRAZOLE SODIUM 40 MG PO TBEC
40.0000 mg | DELAYED_RELEASE_TABLET | Freq: Every day | ORAL | Status: DC
  Filled 2019-03-04: qty 1

## 2019-03-04 MED ORDER — PROCHLORPERAZINE EDISYLATE 10 MG/2ML IJ SOLN: 5.0000 mg | Freq: Four times a day (QID) | INTRAMUSCULAR | Status: DC | PRN

## 2019-03-04 MED ORDER — MIDAZOLAM HCL 2 MG/2ML IJ SOLN
INTRAMUSCULAR | Status: AC
  Filled 2019-03-04: qty 2

## 2019-03-04 MED ORDER — SODIUM CHLORIDE (PF) 0.9 % IJ SOLN
INTRAMUSCULAR | Status: DC | PRN
  Administered 2019-03-04: 1 mL

## 2019-03-04 MED ORDER — METHYLENE BLUE 0.5 % INJ SOLN
INTRAVENOUS | Status: DC | PRN
  Administered 2019-03-04: 4 mL via INTRADERMAL

## 2019-03-04 MED ORDER — BUPIVACAINE-EPINEPHRINE (PF) 0.25% -1:200000 IJ SOLN
INTRAMUSCULAR | Status: DC | PRN
  Administered 2019-03-04: 30 mL

## 2019-03-04 MED ORDER — LACTATED RINGERS IV SOLN
INTRAVENOUS | Status: DC | PRN
  Administered 2019-03-04: 15:00:00 via INTRAVENOUS

## 2019-03-04 MED ORDER — DEXAMETHASONE SODIUM PHOSPHATE 10 MG/ML IJ SOLN
INTRAMUSCULAR | Status: AC
  Filled 2019-03-04: qty 1

## 2019-03-04 MED ORDER — EPHEDRINE 5 MG/ML INJ
INTRAVENOUS | Status: AC
  Filled 2019-03-04: qty 10

## 2019-03-04 MED ORDER — SODIUM CHLORIDE (PF) 0.9 % IJ SOLN
INTRAMUSCULAR | Status: AC
  Filled 2019-03-04: qty 10

## 2019-03-04 MED ORDER — OXYCODONE HCL 5 MG/5ML PO SOLN: 5.0000 mg | Freq: Once | ORAL | Status: DC | PRN

## 2019-03-04 MED ORDER — GABAPENTIN 300 MG PO CAPS
ORAL_CAPSULE | ORAL | Status: AC
  Filled 2019-03-04: qty 1

## 2019-03-04 MED ORDER — LACTATED RINGERS IV SOLN
INTRAVENOUS | Status: DC
  Administered 2019-03-04: 14:00:00 via INTRAVENOUS

## 2019-03-04 MED ORDER — SCOPOLAMINE 1 MG/3DAYS TD PT72: 1.0000 | MEDICATED_PATCH | Freq: Once | TRANSDERMAL | Status: DC | PRN

## 2019-03-04 MED ORDER — PROPOFOL 500 MG/50ML IV EMUL
INTRAVENOUS | Status: AC
  Filled 2019-03-04: qty 50

## 2019-03-04 MED ORDER — ESMOLOL HCL 100 MG/10ML IV SOLN
INTRAVENOUS | Status: AC
  Filled 2019-03-04: qty 20

## 2019-03-04 MED ORDER — POTASSIUM CHLORIDE ER 10 MEQ PO TBCR: 10.0000 meq | EXTENDED_RELEASE_TABLET | Freq: Every day | ORAL | Status: DC

## 2019-03-04 MED ORDER — METOPROLOL TARTRATE 5 MG/5ML IV SOLN
INTRAVENOUS | Status: AC
  Filled 2019-03-04: qty 5

## 2019-03-04 MED ORDER — METOPROLOL TARTRATE 5 MG/5ML IV SOLN
INTRAVENOUS | Status: DC | PRN
  Administered 2019-03-04 (×2): 1 mg via INTRAVENOUS

## 2019-03-04 MED ORDER — SODIUM CHLORIDE 0.9 % IV SOLN
INTRAVENOUS | Status: DC
  Administered 2019-03-04: 19:00:00 via INTRAVENOUS

## 2019-03-04 MED ORDER — LABETALOL HCL 5 MG/ML IV SOLN
INTRAVENOUS | Status: AC
  Filled 2019-03-04: qty 4

## 2019-03-04 MED ORDER — ONDANSETRON HCL 4 MG/2ML IJ SOLN
INTRAMUSCULAR | Status: AC
  Filled 2019-03-04: qty 2

## 2019-03-04 MED ORDER — CYCLOBENZAPRINE HCL 10 MG PO TABS: 10.0000 mg | ORAL_TABLET | Freq: Three times a day (TID) | ORAL | Status: DC | PRN

## 2019-03-04 MED ORDER — FENTANYL CITRATE (PF) 100 MCG/2ML IJ SOLN
INTRAMUSCULAR | Status: AC
  Filled 2019-03-04: qty 2

## 2019-03-04 MED ORDER — OXYCODONE HCL 5 MG PO TABS: 5.0000 mg | ORAL_TABLET | Freq: Once | ORAL | Status: DC | PRN

## 2019-03-04 MED ORDER — LIDOCAINE 2% (20 MG/ML) 5 ML SYRINGE
INTRAMUSCULAR | Status: DC | PRN
  Administered 2019-03-04: 80 mg via INTRAVENOUS

## 2019-03-04 MED ORDER — SODIUM CHLORIDE 0.9 % IV SOLN: INTRAVENOUS | Status: DC | PRN

## 2019-03-04 MED ORDER — PROPOFOL 10 MG/ML IV BOLUS
INTRAVENOUS | Status: DC | PRN
  Administered 2019-03-04: 180 mg via INTRAVENOUS

## 2019-03-04 MED ORDER — OXYCODONE HCL 5 MG PO TABS
5.0000 mg | ORAL_TABLET | ORAL | Status: DC | PRN
  Administered 2019-03-04 – 2019-03-06 (×5): 10 mg via ORAL
  Filled 2019-03-04 (×5): qty 2

## 2019-03-04 MED ORDER — SCOPOLAMINE 1 MG/3DAYS TD PT72: 1.0000 | MEDICATED_PATCH | TRANSDERMAL | Status: DC

## 2019-03-04 MED ORDER — ACETAMINOPHEN 500 MG PO TABS
ORAL_TABLET | ORAL | Status: AC
  Filled 2019-03-04: qty 2

## 2019-03-04 MED ORDER — METHYLENE BLUE 0.5 % INJ SOLN
INTRAVENOUS | Status: AC
  Filled 2019-03-04: qty 10

## 2019-03-04 MED ORDER — ATORVASTATIN CALCIUM 10 MG PO TABS
20.0000 mg | ORAL_TABLET | Freq: Every day | ORAL | Status: DC
  Administered 2019-03-04 – 2019-03-05 (×2): 20 mg via ORAL
  Filled 2019-03-04 (×2): qty 2

## 2019-03-04 MED ORDER — KETOROLAC TROMETHAMINE 30 MG/ML IJ SOLN
INTRAMUSCULAR | Status: AC
  Filled 2019-03-04: qty 1

## 2019-03-04 MED ORDER — LIDOCAINE 2% (20 MG/ML) 5 ML SYRINGE
INTRAMUSCULAR | Status: AC
  Filled 2019-03-04: qty 10

## 2019-03-04 MED ORDER — METOCLOPRAMIDE HCL 5 MG/ML IJ SOLN: 10.0000 mg | Freq: Once | INTRAMUSCULAR | Status: DC | PRN

## 2019-03-04 MED ORDER — MEPERIDINE HCL 25 MG/ML IJ SOLN: 6.2500 mg | INTRAMUSCULAR | Status: DC | PRN

## 2019-03-04 MED ORDER — ONDANSETRON HCL 4 MG/2ML IJ SOLN
INTRAMUSCULAR | Status: DC | PRN
  Administered 2019-03-04 (×2): 4 mg via INTRAVENOUS

## 2019-03-04 MED ORDER — FENTANYL CITRATE (PF) 100 MCG/2ML IJ SOLN
50.0000 ug | INTRAMUSCULAR | Status: DC | PRN
  Administered 2019-03-04: 100 ug via INTRAVENOUS

## 2019-03-04 MED ORDER — SIMETHICONE 80 MG PO CHEW: 40.0000 mg | CHEWABLE_TABLET | Freq: Four times a day (QID) | ORAL | Status: DC | PRN

## 2019-03-04 MED ORDER — PHENYLEPHRINE HCL (PRESSORS) 10 MG/ML IV SOLN
INTRAVENOUS | Status: DC | PRN
  Administered 2019-03-04 (×5): 40 ug via INTRAVENOUS

## 2019-03-04 MED ORDER — HYDROMORPHONE HCL 1 MG/ML IJ SOLN
INTRAMUSCULAR | Status: AC
  Filled 2019-03-04: qty 0.5

## 2019-03-04 MED ORDER — ACETAMINOPHEN 500 MG PO TABS
1000.0000 mg | ORAL_TABLET | ORAL | Status: AC
  Administered 2019-03-04: 1000 mg via ORAL

## 2019-03-04 MED ORDER — ENSURE PRE-SURGERY PO LIQD: 296.0000 mL | Freq: Once | ORAL | Status: DC

## 2019-03-04 MED ORDER — PHENYLEPHRINE 40 MCG/ML (10ML) SYRINGE FOR IV PUSH (FOR BLOOD PRESSURE SUPPORT)
PREFILLED_SYRINGE | INTRAVENOUS | Status: AC
  Filled 2019-03-04: qty 10

## 2019-03-04 MED ORDER — FENTANYL CITRATE (PF) 100 MCG/2ML IJ SOLN
INTRAMUSCULAR | Status: DC | PRN
  Administered 2019-03-04 (×2): 50 ug via INTRAVENOUS

## 2019-03-04 MED ORDER — LABETALOL HCL 5 MG/ML IV SOLN
INTRAVENOUS | Status: DC | PRN
  Administered 2019-03-04: 10 mg via INTRAVENOUS

## 2019-03-04 MED ORDER — LABETALOL HCL 5 MG/ML IV SOLN
5.0000 mg | INTRAVENOUS | Status: DC | PRN
  Administered 2019-03-04: 5 mg via INTRAVENOUS
  Filled 2019-03-04 (×2): qty 4

## 2019-03-04 MED ORDER — ONDANSETRON HCL 4 MG/2ML IJ SOLN
INTRAMUSCULAR | Status: AC
  Filled 2019-03-04: qty 10

## 2019-03-04 MED ORDER — HYDROMORPHONE HCL 1 MG/ML IJ SOLN
0.2500 mg | INTRAMUSCULAR | Status: DC | PRN
  Administered 2019-03-04: 18:00:00 0.5 mg via INTRAVENOUS

## 2019-03-04 MED ORDER — CEFAZOLIN SODIUM-DEXTROSE 2-4 GM/100ML-% IV SOLN
INTRAVENOUS | Status: AC
  Filled 2019-03-04: qty 100

## 2019-03-04 MED ORDER — PROCHLORPERAZINE MALEATE 5 MG PO TABS: 10.0000 mg | ORAL_TABLET | Freq: Four times a day (QID) | ORAL | Status: DC | PRN

## 2019-03-04 MED ORDER — ONDANSETRON 4 MG PO TBDP: 4.0000 mg | ORAL_TABLET | Freq: Four times a day (QID) | ORAL | Status: DC | PRN

## 2019-03-04 MED ORDER — BUPIVACAINE-EPINEPHRINE (PF) 0.25% -1:200000 IJ SOLN
INTRAMUSCULAR | Status: AC
  Filled 2019-03-04: qty 30

## 2019-03-04 MED ORDER — GABAPENTIN 300 MG PO CAPS
300.0000 mg | ORAL_CAPSULE | Freq: Every day | ORAL | Status: DC
  Administered 2019-03-04: 300 mg via ORAL
  Filled 2019-03-04: qty 1

## 2019-03-04 MED ORDER — MORPHINE SULFATE (PF) 2 MG/ML IV SOLN: 2.0000 mg | INTRAVENOUS | Status: DC | PRN

## 2019-03-04 MED ORDER — TECHNETIUM TC 99M SULFUR COLLOID FILTERED
1.0000 | Freq: Once | INTRAVENOUS | Status: AC | PRN
  Administered 2019-03-04: 1 via INTRADERMAL

## 2019-03-04 MED ORDER — KETOROLAC TROMETHAMINE 15 MG/ML IJ SOLN
INTRAMUSCULAR | Status: AC
  Filled 2019-03-04: qty 1

## 2019-03-04 MED ORDER — MIDAZOLAM HCL 2 MG/2ML IJ SOLN
1.0000 mg | INTRAMUSCULAR | Status: DC | PRN
  Administered 2019-03-04: 2 mg via INTRAVENOUS

## 2019-03-04 MED ORDER — LORATADINE 10 MG PO TABS
10.0000 mg | ORAL_TABLET | Freq: Every day | ORAL | Status: DC
  Administered 2019-03-05 – 2019-03-06 (×2): 10 mg via ORAL
  Filled 2019-03-04 (×2): qty 1

## 2019-03-04 MED ORDER — DEXAMETHASONE SODIUM PHOSPHATE 10 MG/ML IJ SOLN
INTRAMUSCULAR | Status: DC | PRN
  Administered 2019-03-04: 10 mg via INTRAVENOUS

## 2019-03-04 MED ORDER — KETOROLAC TROMETHAMINE 15 MG/ML IJ SOLN
15.0000 mg | INTRAMUSCULAR | Status: DC
  Administered 2019-03-04: 14:00:00 15 mg via INTRAVENOUS

## 2019-03-04 MED ORDER — HYDRALAZINE HCL 20 MG/ML IJ SOLN
INTRAMUSCULAR | Status: AC
  Filled 2019-03-04: qty 1

## 2019-03-04 MED ORDER — ONDANSETRON HCL 4 MG/2ML IJ SOLN: 4.0000 mg | Freq: Four times a day (QID) | INTRAMUSCULAR | Status: DC | PRN

## 2019-03-04 MED ORDER — GABAPENTIN 300 MG PO CAPS
300.0000 mg | ORAL_CAPSULE | ORAL | Status: AC
  Administered 2019-03-04: 14:00:00 300 mg via ORAL

## 2019-03-04 MED ORDER — ACETAMINOPHEN 500 MG PO TABS
1000.0000 mg | ORAL_TABLET | Freq: Four times a day (QID) | ORAL | Status: DC
  Administered 2019-03-04 – 2019-03-06 (×6): 1000 mg via ORAL
  Filled 2019-03-04 (×7): qty 2

## 2019-03-04 MED ORDER — ESMOLOL HCL 100 MG/10ML IV SOLN
INTRAVENOUS | Status: DC | PRN
  Administered 2019-03-04: 20 mg via INTRAVENOUS

## 2019-03-04 SURGICAL SUPPLY — 95 items
ADH SKN CLS APL DERMABOND .7 (GAUZE/BANDAGES/DRESSINGS) ×2
APL PRP STRL LF DISP 70% ISPRP (MISCELLANEOUS) ×2
APL SKNCLS STERI-STRIP NONHPOA (GAUZE/BANDAGES/DRESSINGS)
APPLIER CLIP 11 MED OPEN (CLIP) ×4
APPLIER CLIP 9.375 MED OPEN (MISCELLANEOUS) ×8
APR CLP MED 11 20 MLT OPN (CLIP) ×2
APR CLP MED 9.3 20 MLT OPN (MISCELLANEOUS) ×4
BENZOIN TINCTURE PRP APPL 2/3 (GAUZE/BANDAGES/DRESSINGS) IMPLANT
BINDER BREAST LRG (GAUZE/BANDAGES/DRESSINGS) IMPLANT
BINDER BREAST MEDIUM (GAUZE/BANDAGES/DRESSINGS) IMPLANT
BINDER BREAST XLRG (GAUZE/BANDAGES/DRESSINGS) ×3 IMPLANT
BINDER BREAST XXLRG (GAUZE/BANDAGES/DRESSINGS) IMPLANT
BIOPATCH RED 1 DISK 7.0 (GAUZE/BANDAGES/DRESSINGS) IMPLANT
BIOPATCH RED 1IN DISK 7.0MM (GAUZE/BANDAGES/DRESSINGS)
BLADE CLIPPER SURG (BLADE) IMPLANT
BLADE HEX COATED 2.75 (ELECTRODE) IMPLANT
BLADE SURG 10 STRL SS (BLADE) ×4 IMPLANT
BLADE SURG 15 STRL LF DISP TIS (BLADE) ×2 IMPLANT
BLADE SURG 15 STRL SS (BLADE) ×4
CANISTER SUCT 1200ML W/VALVE (MISCELLANEOUS) ×4 IMPLANT
CHLORAPREP W/TINT 26 (MISCELLANEOUS) ×4 IMPLANT
CLIP APPLIE 11 MED OPEN (CLIP) ×1 IMPLANT
CLIP APPLIE 9.375 MED OPEN (MISCELLANEOUS) ×2 IMPLANT
CLIP VESOCCLUDE SM WIDE 6/CT (CLIP) ×1 IMPLANT
CLOSURE WOUND 1/2 X4 (GAUZE/BANDAGES/DRESSINGS)
COVER BACK TABLE REUSABLE LG (DRAPES) ×4 IMPLANT
COVER MAYO STAND REUSABLE (DRAPES) ×4 IMPLANT
COVER PROBE W GEL 5X96 (DRAPES) ×4 IMPLANT
COVER WAND RF STERILE (DRAPES) IMPLANT
DECANTER SPIKE VIAL GLASS SM (MISCELLANEOUS) IMPLANT
DERMABOND ADVANCED (GAUZE/BANDAGES/DRESSINGS) ×2
DERMABOND ADVANCED .7 DNX12 (GAUZE/BANDAGES/DRESSINGS) ×2 IMPLANT
DRAIN CHANNEL 19F RND (DRAIN) ×7 IMPLANT
DRAPE LAPAROSCOPIC ABDOMINAL (DRAPES) ×4 IMPLANT
DRAPE TOP ARMCOVERS (MISCELLANEOUS) ×1 IMPLANT
DRAPE U-SHAPE 76X120 STRL (DRAPES) ×1 IMPLANT
DRAPE UTILITY XL STRL (DRAPES) ×4 IMPLANT
DRSG PAD ABDOMINAL 8X10 ST (GAUZE/BANDAGES/DRESSINGS) ×7 IMPLANT
DRSG TEGADERM 4X4.75 (GAUZE/BANDAGES/DRESSINGS) ×4 IMPLANT
ELECT BLADE 4.0 EZ CLEAN MEGAD (MISCELLANEOUS)
ELECT COATED BLADE 2.86 ST (ELECTRODE) ×4 IMPLANT
ELECT REM PT RETURN 9FT ADLT (ELECTROSURGICAL) ×4
ELECTRODE BLDE 4.0 EZ CLN MEGD (MISCELLANEOUS) IMPLANT
ELECTRODE REM PT RTRN 9FT ADLT (ELECTROSURGICAL) ×2 IMPLANT
EVACUATOR SILICONE 100CC (DRAIN) ×7 IMPLANT
GAUZE SPONGE 4X4 12PLY STRL LF (GAUZE/BANDAGES/DRESSINGS) IMPLANT
GLOVE BIO SURGEON STRL SZ 6.5 (GLOVE) ×2 IMPLANT
GLOVE BIO SURGEON STRL SZ7 (GLOVE) ×4 IMPLANT
GLOVE BIO SURGEONS STRL SZ 6.5 (GLOVE) ×1
GLOVE BIOGEL PI IND STRL 6.5 (GLOVE) ×1 IMPLANT
GLOVE BIOGEL PI IND STRL 7.0 (GLOVE) ×1 IMPLANT
GLOVE BIOGEL PI IND STRL 7.5 (GLOVE) ×2 IMPLANT
GLOVE BIOGEL PI INDICATOR 6.5 (GLOVE) ×2
GLOVE BIOGEL PI INDICATOR 7.0 (GLOVE) ×2
GLOVE BIOGEL PI INDICATOR 7.5 (GLOVE) ×2
GLOVE SURG SS PI 6.0 STRL IVOR (GLOVE) ×6 IMPLANT
GOWN STRL REUS W/ TWL LRG LVL3 (GOWN DISPOSABLE) ×5 IMPLANT
GOWN STRL REUS W/ TWL XL LVL3 (GOWN DISPOSABLE) ×1 IMPLANT
GOWN STRL REUS W/TWL LRG LVL3 (GOWN DISPOSABLE) ×8
GOWN STRL REUS W/TWL XL LVL3 (GOWN DISPOSABLE) ×4
HEMOSTAT ARISTA ABSORB 3G PWDR (HEMOSTASIS) ×9 IMPLANT
ILLUMINATOR WAVEGUIDE N/F (MISCELLANEOUS) IMPLANT
LIGHT WAVEGUIDE WIDE FLAT (MISCELLANEOUS) ×1 IMPLANT
NDL HYPO 25X1 1.5 SAFETY (NEEDLE) ×1 IMPLANT
NDL SAFETY ECLIPSE 18X1.5 (NEEDLE) IMPLANT
NEEDLE HYPO 18GX1.5 SHARP (NEEDLE)
NEEDLE HYPO 25X1 1.5 SAFETY (NEEDLE) ×4 IMPLANT
NS IRRIG 1000ML POUR BTL (IV SOLUTION) ×4 IMPLANT
PACK BASIN DAY SURGERY FS (CUSTOM PROCEDURE TRAY) ×4 IMPLANT
PENCIL BUTTON HOLSTER BLD 10FT (ELECTRODE) ×4 IMPLANT
PIN SAFETY STERILE (MISCELLANEOUS) ×4 IMPLANT
SHEET MEDIUM DRAPE 40X70 STRL (DRAPES) IMPLANT
SLEEVE SCD COMPRESS KNEE MED (MISCELLANEOUS) ×4 IMPLANT
SPONGE LAP 18X18 RF (DISPOSABLE) ×13 IMPLANT
SPONGE LAP 4X18 RFD (DISPOSABLE) ×1 IMPLANT
STRIP CLOSURE SKIN 1/2X4 (GAUZE/BANDAGES/DRESSINGS) IMPLANT
SUT ETHILON 2 0 FS 18 (SUTURE) ×7 IMPLANT
SUT MNCRL AB 4-0 PS2 18 (SUTURE) ×5 IMPLANT
SUT SILK 2 0 SH (SUTURE) ×3 IMPLANT
SUT VIC AB 2-0 SH 18 (SUTURE) ×3 IMPLANT
SUT VIC AB 2-0 SH 27 (SUTURE) ×4
SUT VIC AB 2-0 SH 27XBRD (SUTURE) ×2 IMPLANT
SUT VIC AB 3-0 54X BRD REEL (SUTURE) IMPLANT
SUT VIC AB 3-0 BRD 54 (SUTURE)
SUT VIC AB 3-0 SH 27 (SUTURE)
SUT VIC AB 3-0 SH 27X BRD (SUTURE) ×1 IMPLANT
SUT VICRYL 3-0 CR8 SH (SUTURE) ×10 IMPLANT
SUT VICRYL AB 2 0 TIE (SUTURE) IMPLANT
SUT VICRYL AB 2 0 TIES (SUTURE)
SUT VICRYL AB 3 0 TIES (SUTURE) IMPLANT
SYR CONTROL 10ML LL (SYRINGE) ×4 IMPLANT
TOWEL GREEN STERILE FF (TOWEL DISPOSABLE) ×8 IMPLANT
TUBE CONNECTING 20'X1/4 (TUBING) ×1
TUBE CONNECTING 20X1/4 (TUBING) ×3 IMPLANT
YANKAUER SUCT BULB TIP NO VENT (SUCTIONS) ×4 IMPLANT

## 2019-03-04 NOTE — Progress Notes (Signed)
Patient to be transferred to [facility] for inpatient admission/observation. Security called and came top [facility] and picked up valuables/cash with purse to place in safe. Patients cell phone, bluetooth, glasses, suticase kept for transport with patient.Copy of valuables inventory given to patient and copy placed in chart.

## 2019-03-04 NOTE — Progress Notes (Signed)
Assisted Dr. Royce Macadamia and Dr Christella Hartigan with right, ultrasound guided, pectoralis block. Side rails up, monitors on throughout procedure. See vital signs in flow sheet. Tolerated Procedure well.

## 2019-03-04 NOTE — Anesthesia Preprocedure Evaluation (Signed)
Anesthesia Evaluation  Patient identified by MRN, date of birth, ID band Patient awake    Reviewed: Allergy & Precautions, NPO status , Patient's Chart, lab work & pertinent test results  Airway Mallampati: II  TM Distance: >3 FB Neck ROM: Full    Dental no notable dental hx. (+) Teeth Intact   Pulmonary neg pulmonary ROS,  Chronic cough   Pulmonary exam normal breath sounds clear to auscultation       Cardiovascular negative cardio ROS Normal cardiovascular exam Rhythm:Regular Rate:Normal     Neuro/Psych negative neurological ROS  negative psych ROS   GI/Hepatic Neg liver ROS, GERD  Medicated and Controlled,IBS   Endo/Other  Obesity Right Breast Ca S/P Chemo  Renal/GU negative Renal ROS  negative genitourinary   Musculoskeletal Right hip pain   Abdominal (+) + obese,   Peds  Hematology  (+) anemia ,   Anesthesia Other Findings   Reproductive/Obstetrics                             Anesthesia Physical Anesthesia Plan  ASA: II  Anesthesia Plan: General   Post-op Pain Management:    Induction: Intravenous  PONV Risk Score and Plan: 4 or greater and Ondansetron, Dexamethasone, Scopolamine patch - Pre-op, Midazolam and Treatment may vary due to age or medical condition  Airway Management Planned: LMA  Additional Equipment:   Intra-op Plan:   Post-operative Plan: Extubation in OR  Informed Consent: I have reviewed the patients History and Physical, chart, labs and discussed the procedure including the risks, benefits and alternatives for the proposed anesthesia with the patient or authorized representative who has indicated his/her understanding and acceptance.     Dental advisory given  Plan Discussed with: CRNA and Surgeon  Anesthesia Plan Comments:         Anesthesia Quick Evaluation

## 2019-03-04 NOTE — Op Note (Signed)
Preoperative diagnosis: Right breast cancer s/p primary chemotherapy Postoperative diagnosis: same as above Procedure: 1. Right mastectomy 2. Right deep axillary sentinel node biopsy 3. Injection blue dye for sentinel node identification Surgeon: Dr Serita Grammes EBL: 150 cc Drains 2 19 Fr Blake drains Specimens: 1. Right breast marked [place] superior, long lateral 2. 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  Her nodes have been negative. Oncology would like to go ahead with surgery and she desires this now as well. We had long discussion about resectability at this point and continuing systemic therapy vs surgery. We decided to proceed with mastectomy, sn biopsy and possible axillary node dissection  Procedure: After informed consent was obtained the patient first underwent a pectoral block and was injected with technetium in the standard periareolar fashion on the right. She was given antibiotics. SCDs were in place. She was then placed under general anesthesia without complication. She was prepped and draped in the standard sterile surgical fashion. A surgical timeout was then performed.  I first injected 5 cc of a methylene blue saline mixture in the right periareolar position and massaged this.  I made a large elliptical incision on the right side.  There was some local skin involvement that I excised as well. I made flaps to the parasternal region, clavicle, IM fold and latissimus.  This was difficult due to multiple very friable areas and a very large tumor that appeared adherent to chest wall. I was then able to roll this up and remove the fascia off the pectoralis muscle. She had gross invasion to her pec in one area and I resected this muscle en bloc.  There is no gross  tumor remaining.  I was able to remove the breast and mark as above.  I thinned the flaps some as well.   I then entered the axilla. I identified what appeared to be two radioactive sentinel nodes. There was no palpable adenopathy.  I excised these and sent them to pathology. They were negative on intraoperative pathology. I did remove some of the low lying axillary tissue as well as this appeared to be somewhat scarred. This went up near the axillary vein.   I then obtained hemostasis. I then placed a 19 Fr Blake drain in the axilla and one in mastectomy space and secured these with a 2-0 nylon.  I then closed the lateral skin to the chest wall using 2-0 vicryl. The skin was closed with 3-0 vicryl and 4-0 monocryl. Glue and steristrips were applied.  She was very tachycardic and hypertensive during surgery and I will transfer her to [facility] for monitoring and not remain at day surgery tonight although she eventually did very well.

## 2019-03-04 NOTE — Progress Notes (Signed)
Report called to [place], RN on 5N.

## 2019-03-04 NOTE — Discharge Instructions (Signed)
[facility] °[phone] ° °MASTECTOMY: POST OP INSTRUCTIONS °Take 400 mg of ibuprofen every 8 hours or 650 mg tylenol every 6 hours for next 72 hours then as needed. Use ice several times daily also. °Always review your discharge instruction sheet given to you by the facility where your surgery was performed. ° °IF YOU HAVE DISABILITY OR FAMILY LEAVE FORMS, YOU MUST BRING THEM TO THE OFFICE FOR PROCESSING.   °DO NOT GIVE THEM TO YOUR DOCTOR. °A prescription for pain medication may be given to you upon discharge.  Take your pain medication as prescribed, if needed.  If narcotic pain medicine is not needed, then you may take acetaminophen (Tylenol), naprosyn (Alleve) or ibuprofen (Advil) as needed. °1. Take your usually prescribed medications unless otherwise directed. °2. If you need a refill on your pain medication, please contact your pharmacy.  They will contact our office to request authorization.  Prescriptions will not be filled after 5pm or on week-ends. °3. You should follow a light diet the first few days after arrival home, such as soup and crackers, etc.  Resume your normal diet the day after surgery. °4. Most patients will experience some swelling and bruising on the chest and underarm.  Ice packs will help.  Swelling and bruising can take several days to resolve. Wear the binder day and night until you return to the office.  °5. It is common to experience some constipation if taking pain medication after surgery.  Increasing fluid intake and taking a stool softener (such as Colace) will usually help or prevent this problem from occurring.  A mild laxative (Milk of Magnesia or Miralax) should be taken according to package instructions if there are no bowel movements after 48 hours. °6. Unless discharge instructions indicate otherwise, leave your bandage dry and in place until your next appointment in 3-5 days.  You may take a limited sponge bath.  No tube baths or showers until the  drains are removed.  You may have steri-strips (small skin tapes) in place directly over the incision.  These strips should be left on the skin for 7-10 days. If you have glue it will come off in next couple week.  Any sutures will be removed at an office visit °7. DRAINS:  If you have drains in place, it is important to keep a list of the amount of drainage produced each day in your drains.  Before leaving the hospital, you should be instructed on drain care.  Call our office if you have any questions about your drains. I will remove your drains when they put out less than 30 cc or ml for 2 consecutive days. °8. ACTIVITIES:  You may resume regular (light) daily activities beginning the next day--such as daily self-care, walking, climbing stairs--gradually increasing activities as tolerated.  You may have sexual intercourse when it is comfortable.  Refrain from any heavy lifting or straining until approved by your doctor. °a. You may drive when you are no longer taking prescription pain medication, you can comfortably wear a seatbelt, and you can safely maneuver your car and apply brakes. °b. RETURN TO WORK:  __________________________________________________________ °9. You should see your doctor in the office for a follow-up appointment approximately 3-5 days after your surgery.  Your doctor’s nurse will typically make your follow-up appointment when she calls you with your pathology report.  Expect your pathology report 3-4business days after surgery. °10. OTHER INSTRUCTIONS: ______________________________________________________________________________________________ ____________________________________________________________________________________________ °WHEN TO CALL YOUR DR Orit Sanville: °1. Fever over 101.0 °  2. Nausea and/or vomiting °3. Extreme swelling or bruising °4. Continued bleeding from incision. °5. Increased pain, redness, or drainage from the incision. °The clinic staff is available to answer your  questions during regular business hours.  Please don’t hesitate to call and ask to speak to one of the nurses for clinical concerns.  If you have a medical emergency, go to the nearest emergency room or call 911.  A surgeon from [facility] is always on call at the hospital. °[address], [address], [place], [place]  [address] ? [address] [address], [place], [place]   [address] °[phone] ? [phone] ? FAX [phone] °Web site: [email or web address] ° °

## 2019-03-04 NOTE — H&P (Signed)
Brittany Rodgers is an 48 y.o. female.   Chief Complaint: breast cancer HPI: 72 yof  after new diagnosis of right breast cancer and now undergoing primary chemotherapy she has extensive fh. she noted a right breast mass. no prior breast history. she on mm and [name] has a 3.6x2.9x2.3 cm mass at 6 oclock in the right breast. there was one prominent node that was biopsied. this was benign concordant. 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 she declined genetics at time of dx. initial mri showed a 5.4x4.6x3.9 mass in loq of right breast with possible invasion of pec and significant necrosis. there was additional nme throughout breast. there is single abnormal right ax node. she has now begun chemo via a port. staging negative Dr Jana Hakim was concerned this was growing and obtained another mri. this shows enlarging right breast mass now 10x7.5x8 cm, I think some of this is necrosis, there is contact with skin and there is contact with pec. unchanged 7 mm right im node. right ax nodes normal. there is now a 1.2 cm linear nme on left. this is biopsied and now benign. she and oncology have elected to stop chemotherapy and would like to proceed with surgery  Past Medical History:  Diagnosis Date  . Chronic cough   . Family history of breast cancer   . GERD (gastroesophageal reflux disease)   . Hyperlipidemia   . IBS (irritable bowel syndrome)    sees Dr. Silvano Rusk   . Routine gynecological examination    sees Dr. Freda Munro    Past Surgical History:  Procedure Laterality Date  . CESAREAN SECTION    . PORTACATH PLACEMENT Right 11/14/2018   Procedure: INSERTION PORT-A-CATH WITH ULTRASOUND;  Surgeon: Rolm Bookbinder, MD;  Location: [facility];  Service: General;  Laterality: Right;  . ROOT CANAL      Family History  Problem Relation Age of Onset  . Hypertension Other   . Breast cancer Other        mat first cousin's daughter  . Breast cancer  Maternal Aunt   . Hypertension Mother   . Breast cancer Maternal Aunt   . Breast cancer Maternal Aunt    Social History:  reports that she has never smoked. She has never used smokeless tobacco. She reports that she does not drink alcohol or use drugs.  Allergies:  Allergies  Allergen Reactions  . Barbiturates Other (See Comments)    Keeps pt awake  . Sulfonamide Derivatives Itching    No medications prior to admission.    No results found for this or any previous visit (from the past 48 hour(s)). No results found.  Review of Systems  Respiratory: Negative for shortness of breath.   Cardiovascular: Negative for chest pain.  Gastrointestinal: Negative for abdominal pain.  All other systems reviewed and are negative.   Height 5\' 2"  (1.575 m), weight 81.6 kg. Physical Exam  Physical Exam Rolm Bookbinder MD; 02/21/2019 12:53 PM) Breast Note: large right breast mass, feels more mobile today, ulceration ofskin laterally no adenopathy cv rrr Lungs clear  Assessment/Plan BREAST CANCER OF LOWER-OUTER QUADRANT OF RIGHT FEMALE BREAST (C50.511) Story: Right mastectomy, right ax sn biopsy possible alnd She needs right mastectomy. this has grown on chemotherapy. she very much wants to proceed with surgery. will have delayed recon. I talked to her about incision and mastectomy, drains and risk of wound issues. I will need to remove a fair amount of her skin with  this. I think anything on left should wait at this point and we should treat just the right. Her nodes are normal on mri now and clinically. she had an abnl ax node on right at beginning that was biopsied benign concordant. I still am concerned about nodes. will plan for sn on right at time of surgery with intraop pathology shows tumor will proceed with alnd will schedule for two weeks from now we discussed risks and recovery   Rolm Bookbinder, MD 03/04/2019, 8:51 AM

## 2019-03-04 NOTE — Anesthesia Postprocedure Evaluation (Signed)
Anesthesia Post Note  Patient: Brittany Rodgers  Procedure(s) Performed: RIGHT MASTECTOMY WITH RIGHT AXILLARY SENTINEL LYMPH NODE BIOPSY (Right Breast)     Patient location during evaluation: PACU Anesthesia Type: General Level of consciousness: awake and alert and oriented Pain management: pain level controlled Vital Signs Assessment: post-procedure vital signs reviewed and stable Respiratory status: spontaneous breathing, nonlabored ventilation and respiratory function stable Cardiovascular status: blood pressure returned to baseline and stable Postop Assessment: no apparent nausea or vomiting Anesthetic complications: no Comments: Patient to be transferred to [facility] hospital per surgeon's request.    Last Vitals:  Vitals:   03/04/19 1630 03/04/19 1645  BP: 113/72 113/75  Pulse: (!) 102 (!) 107  Resp: 14 19  Temp: 37.3 C   SpO2: 100% 100%    Last Pain:  Vitals:   03/04/19 1623  TempSrc:   PainSc: Asleep                 Kennth Vanbenschoten A.

## 2019-03-04 NOTE — Transfer of Care (Signed)
Immediate Anesthesia Transfer of Care Note  Patient: Brittany Rodgers  Procedure(s) Performed: RIGHT MASTECTOMY WITH RIGHT AXILLARY SENTINEL LYMPH NODE BIOPSY (Right Breast) POSSIBLE RIGHT AXILLARY LYMPH NODE DISSECTION (Right )  Patient Location: PACU  Anesthesia Type:GA combined with regional for post-op pain  Level of Consciousness: drowsy and patient cooperative  Airway & Oxygen Therapy: Patient Spontanous Breathing and Patient connected to nasal cannula oxygen  Post-op Assessment: Report given to RN and Post -op Vital signs reviewed and stable  Post vital signs: Reviewed and stable  Last Vitals:  Vitals Value Taken Time  BP    Temp    Pulse    Resp 9 03/04/2019  4:22 PM  SpO2    Vitals shown include unvalidated device data.  Last Pain:  Vitals:   03/04/19 1324  TempSrc: Oral  PainSc: 0-No pain      Patients Stated Pain Goal: 1 (09/[birth year] 8832)  Complications: No apparent anesthesia complications

## 2019-03-04 NOTE — Anesthesia Procedure Notes (Signed)
Procedure Name: LMA Insertion Date/Time: 03/04/2019 2:25 PM Performed by: Gwyndolyn Saxon, CRNA Pre-anesthesia Checklist: Patient identified, Emergency Drugs available, Suction available and Patient being monitored Patient Re-evaluated:Patient Re-evaluated prior to induction Oxygen Delivery Method: Circle system utilized Preoxygenation: Pre-oxygenation with 100% oxygen Induction Type: IV induction Ventilation: Mask ventilation without difficulty LMA: LMA inserted LMA Size: 4.0 Number of attempts: 1 Airway Equipment and Method: Patient positioned with wedge pillow Placement Confirmation: positive ETCO2 and breath sounds checked- equal and bilateral Tube secured with: Tape Dental Injury: Teeth and Oropharynx as per pre-operative assessment

## 2019-03-04 NOTE — Interval H&P Note (Signed)
History and Physical Interval Note:  03/04/2019 1:51 PM  Brittany Rodgers  has presented today for surgery, with the diagnosis of RIGHT BREAST CANCER.  The various methods of treatment have been discussed with the patient and family. After consideration of risks, benefits and other options for treatment, the patient has consented to  Procedure(s): RIGHT MASTECTOMY WITH RIGHT AXILLARY SENTINEL LYMPH NODE BIOPSY (Right) POSSIBLE RIGHT AXILLARY LYMPH NODE DISSECTION (Right) as a surgical intervention.  The patient's history has been reviewed, patient examined, no change in status, stable for surgery.  I have reviewed the patient's chart and labs.  Questions were answered to the patient's satisfaction.     Rolm Bookbinder

## 2019-03-04 NOTE — Anesthesia Procedure Notes (Signed)
Anesthesia Regional Block: Pectoralis block   Pre-Anesthetic Checklist: ,, timeout performed, Correct Patient, Correct Site, Correct Laterality, Correct Procedure, Correct Position, site marked, Risks and benefits discussed,  Surgical consent,  Pre-op evaluation,  At surgeon's request and post-op pain management  Laterality: Right  Prep: chloraprep       Needles:  Injection technique: Single-shot  Needle Type: Echogenic Stimulator Needle     Needle Length: 9cm  Needle Gauge: 21     Additional Needles:   Procedures:,,,, ultrasound used (permanent image in chart),,,,  Narrative:  Start time: 03/04/2019 1:50 PM End time: 03/04/2019 1:57 PM Injection made incrementally with aspirations every 5 mL.  Performed by: Personally  Anesthesiologist: Lidia Collum, MD  Additional Notes: Monitors applied. Injection made in 5cc increments. No resistance to injection. Good needle visualization. Patient tolerated procedure well.

## 2019-03-04 NOTE — Progress Notes (Signed)
Pt arrived to 5N30 via [facility]. Received report from Radonna Ricker, [place] at [facility]. See assessment. Will continue to monitor.

## 2019-03-05 ENCOUNTER — Encounter (HOSPITAL_BASED_OUTPATIENT_CLINIC_OR_DEPARTMENT_OTHER): Payer: Self-pay | Admitting: General Surgery

## 2019-03-05 DIAGNOSIS — C50511 Malignant neoplasm of lower-outer quadrant of right female breast: Secondary | ICD-10-CM | POA: Diagnosis not present

## 2019-03-05 LAB — CBC
HCT: 29.8 % — ABNORMAL LOW (ref 36.0–46.0)
Hemoglobin: 9.4 g/dL — ABNORMAL LOW (ref 12.0–15.0)
MCH: 30.8 pg (ref 26.0–34.0)
MCHC: 31.5 g/dL (ref 30.0–36.0)
MCV: 97.7 fL (ref 80.0–100.0)
Platelets: 215 10*3/uL (ref 150–400)
RBC: 3.05 MIL/uL — ABNORMAL LOW (ref 3.87–5.11)
RDW: 18.1 % — ABNORMAL HIGH (ref 11.5–15.5)
WBC: 8.8 10*3/uL (ref 4.0–10.5)
nRBC: 0 % (ref 0.0–0.2)

## 2019-03-05 LAB — BASIC METABOLIC PANEL
Anion gap: 11 (ref 5–15)
BUN: 9 mg/dL (ref 6–20)
CO2: 25 mmol/L (ref 22–32)
Calcium: 8.9 mg/dL (ref 8.9–10.3)
Chloride: 102 mmol/L (ref 98–111)
Creatinine, Ser: 0.77 mg/dL (ref 0.44–1.00)
GFR calc Af Amer: >60 mL/min (ref >60)
GFR calc non Af Amer: >60 mL/min (ref >60)
Glucose, Bld: 135 mg/dL — ABNORMAL HIGH (ref 70–99)
Potassium: 4.4 mmol/L (ref 3.5–5.1)
Sodium: 138 mmol/L (ref 135–145)

## 2019-03-05 MED ORDER — PANTOPRAZOLE SODIUM 40 MG PO TBEC: 40.0000 mg | DELAYED_RELEASE_TABLET | Freq: Every day | ORAL | Status: DC

## 2019-03-05 MED ORDER — CYCLOBENZAPRINE HCL 10 MG PO TABS
10.0000 mg | ORAL_TABLET | Freq: Three times a day (TID) | ORAL | Status: DC
  Administered 2019-03-05 – 2019-03-06 (×4): 10 mg via ORAL
  Filled 2019-03-05 (×4): qty 1

## 2019-03-05 MED ORDER — PANTOPRAZOLE SODIUM 40 MG PO TBEC
40.0000 mg | DELAYED_RELEASE_TABLET | Freq: Every day | ORAL | Status: DC
  Administered 2019-03-05: 40 mg via ORAL
  Filled 2019-03-05: qty 1

## 2019-03-05 MED ORDER — GABAPENTIN 300 MG PO CAPS
300.0000 mg | ORAL_CAPSULE | Freq: Two times a day (BID) | ORAL | Status: DC
  Administered 2019-03-05 – 2019-03-06 (×3): 300 mg via ORAL
  Filled 2019-03-05 (×3): qty 1

## 2019-03-05 NOTE — Plan of Care (Signed)
Problem: Clinical Measurements: Goal: Ability to maintain clinical measurements within normal limits will improve Outcome: Progressing Goal: Will remain free from infection Outcome: Progressing Goal: Respiratory complications will improve Outcome: Progressing Goal: Cardiovascular complication will be avoided Outcome: Progressing   Problem: Activity: Goal: Risk for activity intolerance will decrease Outcome: Progressing   Problem: Nutrition: Goal: Adequate nutrition will be maintained Outcome: Progressing   Problem: Elimination: Goal: Will not experience complications related to urinary retention Outcome: Progressing   Problem: Pain Managment: Goal: General experience of comfort will improve Outcome: Progressing   Problem: Safety: Goal: Ability to remain free from injury will improve Outcome: Progressing   Problem: Skin Integrity: Goal: Risk for impaired skin integrity will decrease Outcome: Progressing

## 2019-03-05 NOTE — Progress Notes (Signed)
1 Day Post-Op   Subjective/Chief Complaint: Pain right side, otherwise doing well, drains as expected   Objective: Vital signs in last 24 hours: Temp:  [97.7 F (36.5 C)-99.1 F (37.3 C)] 98.4 F (36.9 C) (06/10 0434) Pulse Rate:  [100-143] 100 (06/10 0434) Resp:  [7-28] 16 (06/10 0434) BP: (113-177)/(69-106) 125/85 (06/10 0434) SpO2:  [98 %-100 %] 100 % (06/10 0434) Weight:  [82.2 kg] 82.2 kg (06/09 1324) Last BM Date: 03/03/19(pt reported)  Intake/Output from previous day: 06/09 0701 - 06/10 0700 In: 1540 [P.O.:240; I.V.:1300] Out: 350 [Drains:150; Blood:200] Intake/Output this shift: No intake/output data recorded.  cv tachy rr Lungs clear Flaps viable, no hematoma, drains serosang  Lab Results:  Recent Labs    03/05/19 0605  WBC 8.8  HGB 9.4*  HCT 29.8*  PLT 215   BMET Recent Labs    03/05/19 0605  NA 138  K 4.4  CL 102  CO2 25  GLUCOSE 135*  BUN 9  CREATININE 0.77  CALCIUM 8.9    Anti-infectives: Anti-infectives (From admission, onward)   Start     Dose/Rate Route Frequency Ordered Stop   03/05/19 0600  ceFAZolin (ANCEF) IVPB 2g/100 mL premix     2 g 200 mL/hr over 30 Minutes Intravenous On call to O.R. 03/04/19 1252 03/04/19 1455      Assessment/Plan: POD 1 right mastectomy, sn biopsy 1. Schedule neurontin bid, tylenol scheduled, mm relaxant, prn narcotic 2. Bp/heart rate better, dc tele 3. pulm toilet 4. Will keep one more day for monitoring  Rolm Bookbinder 03/05/2019

## 2019-03-06 DIAGNOSIS — C50511 Malignant neoplasm of lower-outer quadrant of right female breast: Secondary | ICD-10-CM | POA: Diagnosis not present

## 2019-03-06 MED ORDER — OXYCODONE HCL 5 MG PO TABS: 5.0000 mg | ORAL_TABLET | ORAL | 0 refills | Status: DC | PRN

## 2019-03-06 MED ORDER — GABAPENTIN 300 MG PO CAPS: 300.0000 mg | ORAL_CAPSULE | Freq: Two times a day (BID) | ORAL | 1 refills | Status: DC

## 2019-03-06 MED ORDER — CYCLOBENZAPRINE HCL 10 MG PO TABS: 10.0000 mg | ORAL_TABLET | Freq: Three times a day (TID) | ORAL | 2 refills | Status: DC

## 2019-03-06 NOTE — Discharge Summary (Signed)
Physician Discharge Summary  Patient ID: Brittany Rodgers MRN: [id number] DOB/AGE: [birth year] 48 y.o.  Admit date: 03/04/2019 Discharge date: 03/06/2019  Admission Diagnoses: clincal stage III breast cancer Active chemotherapy gerd  Discharge Diagnoses:  Active Problems:   Breast cancer, right (HCC)   S/P mastectomy   Discharged Condition: good  Hospital Course: 48 yof who has large right breast tumor that enlarged significantly on chemotherapy. I took her for right mastectomy and sn biopsy that was negative intraoperatively.  She had tachycardia and hypertension at day surgery center during procedure responsive to medication. Postop I transferred to hospital for monitoring where her vitals have all been normal. No issues with oxygenation or breathing.  Her drains have expected output. She is sore but ready for dc.  Will have pt see her prior to leaving to assist with transfers, getting up etc.   Consults: None  Significant Diagnostic Studies: none  Treatments: surgery: right mastectomy, sn   Discharge Exam: Blood pressure 129/67, pulse (!) 109, temperature 98.2 F (36.8 C), temperature source Oral, resp. rate 16, height 5\' 2"  (1.575 m), weight 82.2 kg, SpO2 100 %. Resp: clear to auscultation bilaterally Cardio: regular rate and rhythm Incision/Wound:flaps viable some axillary swelling,drains as expected  Disposition: Discharge disposition: 01-Home or Self Care        Allergies as of 03/06/2019      Reactions   Barbiturates Other (See Comments)   Keeps pt awake   Sulfonamide Derivatives Itching      Medication List    STOP taking these medications   magic mouthwash Soln     TAKE these medications   atorvastatin 20 MG tablet Commonly known as: LIPITOR Take 1 tablet (20 mg total) by mouth daily.   cyclobenzaprine 10 MG tablet Commonly known as: FLEXERIL TAKE 1 TABLET BY MOUTH THREE TIMES DAILY AS NEEDED FOR MUSCLE SPASMS What changed:   reasons to take  this  additional instructions   cyclobenzaprine 10 MG tablet Commonly known as: FLEXERIL Take 1 tablet (10 mg total) by mouth 3 (three) times daily. What changed: You were already taking a medication with the same name, and this prescription was added. Make sure you understand how and when to take each.   dexamethasone 4 MG tablet Commonly known as: DECADRON TAKE 2 TABLETS BY MOUTH ONCE DAILY ON THE DAY AFTER CHEMOTHERAPY AND THEN TAKE 2 TABLETS TWICE DAILY FOR 2 DAYS. TAKE WITH FOOD. What changed:   how much to take  how to take this  when to take this   fluconazole 100 MG tablet Commonly known as: DIFLUCAN Take 1 tablet (100 mg total) by mouth daily.   gabapentin 300 MG capsule Commonly known as: NEURONTIN Take 1 capsule (300 mg total) by mouth at bedtime. What changed: Another medication with the same name was added. Make sure you understand how and when to take each.   gabapentin 300 MG capsule Commonly known as: NEURONTIN Take 1 capsule (300 mg total) by mouth 2 (two) times daily. What changed: You were already taking a medication with the same name, and this prescription was added. Make sure you understand how and when to take each.   lidocaine-prilocaine cream Commonly known as: EMLA Apply to affected area once What changed:   how much to take  how to take this  when to take this  reasons to take this  additional instructions   loratadine 10 MG tablet Commonly known as: Claritin Take 1 tablet (10 mg total) by mouth daily.  LORazepam 0.5 MG tablet Commonly known as: ATIVAN TAKE 1 TABLET BY MOUTH AT BEDTIME AS NEEDED FOR NAUSEA AND VOMITING What changed: See the new instructions.   Omeprazole-Sodium Bicarbonate 20-1100 MG Caps capsule Commonly known as: Zegerid Take 1 capsule by mouth daily before breakfast.   ondansetron 8 MG tablet Commonly known as: ZOFRAN Take 1 tablet (8 mg total) by mouth every 8 (eight) hours as needed for nausea or  vomiting.   oxyCODONE 5 MG immediate release tablet Commonly known as: Oxy IR/ROXICODONE Take 1 tablet (5 mg total) by mouth every 4 (four) hours as needed for moderate pain.   polyethylene glycol 17 g packet Commonly known as: MIRALAX / GLYCOLAX Take 17 g by mouth as needed. What changed:   when to take this  reasons to take this  additional instructions   potassium chloride 10 MEQ tablet Commonly known as: K-DUR TAKE 1 TABLET BY MOUTH ONCE DAILY   prochlorperazine 10 MG tablet Commonly known as: COMPAZINE Take 1 tablet (10 mg total) by mouth every 6 (six) hours as needed (Nausea or vomiting).   traMADol 50 MG tablet Commonly known as: ULTRAM Take 1-2 tablets (50-100 mg total) by mouth every 6 (six) hours as needed. What changed: reasons to take this   valACYclovir 500 MG tablet Commonly known as: VALTREX Take 1 tablet (500 mg total) by mouth 2 (two) times daily.      Follow-up Information    Rolm Bookbinder, MD In 1 week.   Specialty: General Surgery Contact information: [place] [address] [phone]           Signed: Rolm Bookbinder 03/06/2019, 8:36 AM

## 2019-03-06 NOTE — Progress Notes (Signed)
Written and verbal discharge instructions provided.  The patient has a discharge packet for home.  The patient is aware of her follow up and any signs and symptoms to report to the Doctor sooner if needed.   The patient has hemovacs in place and were given instructions on how to empty.

## 2019-03-06 NOTE — Evaluation (Signed)
Occupational Therapy Evaluation Patient Details Name: Brittany Rodgers MRN: [id number] DOB: 1970-11-24 Today's Date: 03/06/2019    History of Present Illness Pt is a 48 y.o. female admitted 03/04/19 with R breast tumor that enlarged significantly on chemo. S/p R mastectomy and node biopsy 6/9. PMH includes breast CA, HLD, IBS.   Clinical Impression   Pt PTA: living with spouse and independent, going to chemo. Pt currently limited by R incision and soreness in chest and R chest and RUE. Pt education thoroughly on energy conservation techniques, proper sequence for dressing, pain management to set alarms for pain schedule, and IADL for making larger meals to eat throughout the week. Pt expressed concern that her spouse works late into the evening so she is home alone for most of the day. Pt's LUE with good ROM for toilet hygiene and for light cooking. Pt with RUE exercises for edema management and to increase ROM. Pt tolerating session well. OT to follow acutely. Pt requires continued OT skilled services for ADL, mobility and progression of HEP with HHOT and 3in1 for safety due to low commodes at home.      Follow Up Recommendations  Home health OT    Equipment Recommendations  3 in 1 bedside commode    Recommendations for Other Services       Precautions / Restrictions Precautions Precautions: Fall Precaution Comments: s/p R mastectomy, drains and compression wrap Restrictions Weight Bearing Restrictions: No      Mobility Bed Mobility Overal bed mobility: Needs Assistance Bed Mobility: Rolling;Sidelying to Sit Rolling: Modified independent (Device/Increase time) Sidelying to sit: Modified independent (Device/Increase time)       General bed mobility comments: in recliner upon arrival  Transfers Overall transfer level: Independent Equipment used: None Transfers: Sit to/from [employer] transfer comment: deferred as pt about to eat lunch    Balance  Overall balance assessment: Needs assistance   Sitting balance-Leahy Scale: Good       Standing balance-Leahy Scale: Good                             ADL either performed or assessed with clinical judgement   ADL Overall ADL's : Needs assistance/impaired Eating/Feeding: Set up;Sitting   Grooming: Set up;Sitting   Upper Body Bathing: Minimal assistance;Sitting   Lower Body Bathing: Moderate assistance;Sitting/lateral leans;Sit to/from stand   Upper Body Dressing : Minimal assistance;Sitting   Lower Body Dressing: Moderate assistance;Sitting/lateral leans;Sit to/from stand   Toilet Transfer: Min guard;BSC   Toileting- Water quality scientist and Hygiene: Moderate assistance;Sitting/lateral lean;Sit to/from stand       Functional mobility during ADLs: Min guard General ADL Comments: pt's RUE is her dominant hand; pt requires min to modA for ADL and requires increased time and it is a taxing effort.     Vision Baseline Vision/History: No visual deficits Vision Assessment?: No apparent visual deficits     Perception     Praxis      Pertinent Vitals/Pain Pain Assessment: 0-10 Pain Score: 10-Worst pain ever Faces Pain Scale: Hurts even more Pain Location: RUE, R-side chest and back Pain Descriptors / Indicators: Grimacing;Guarding;Sore Pain Intervention(s): Monitored during session     Hand Dominance Right   Extremity/Trunk Assessment Upper Extremity Assessment Upper Extremity Assessment: RUE deficits/detail RUE Deficits / Details: s/p mastectomy; swelling in RUE; finger/wrist/elbow motion WFL although limited by pain/soreness; shoulder motion limited and guarded with soreness RUE:  Unable to fully assess due to pain RUE Coordination: decreased gross motor;decreased fine motor   Lower Extremity Assessment Lower Extremity Assessment: Defer to PT evaluation       Communication Communication Communication: No difficulties   Cognition  Arousal/Alertness: Awake/alert Behavior During Therapy: WFL for tasks assessed/performed Overall Cognitive Status: Within Functional Limits for tasks assessed                                     General Comments  Pt education on precautions for lifting, HEP provided for gentle ROM below shoulder level and education for energy conservation with ADL. pt education to dress RUE prior to LUE. Pt education to set alarms for pain medicine time.    Exercises Other Exercises Other Exercises: Gentle R finger/wrist/elbow/shoulder ROM, shoulder rolls, cervical flex/ext/rotation   Shoulder Instructions      Home Living Family/patient expects to be discharged to:: Private residence Living Arrangements: Alone Available Help at Discharge: Family;Available PRN/intermittently Type of Home: House Home Access: Stairs to enter [employer] of Steps: 3 Entrance Stairs-Rails: Right Home Layout: One level     Bathroom Shower/Tub: Teacher, early years/pre: Standard     Home Equipment: Shower seat   Additional Comments: Mother-in-law can drive pt home, but unable to provide physical assist      Prior Functioning/Environment Level of Independence: Independent        Comments: Had outpatient PT for R shoulder/lymphedema in 10/2017        OT Problem List: Decreased strength;Decreased activity tolerance;Decreased range of motion;Impaired balance (sitting and/or standing);Decreased coordination;Decreased safety awareness;Pain;Impaired UE functional use;Increased edema;Decreased knowledge of precautions;Decreased knowledge of use of DME or AE      OT Treatment/Interventions: Self-care/ADL training;Therapeutic exercise;Neuromuscular education;Energy conservation;DME and/or AE instruction;Therapeutic activities;Patient/family education;Balance training    OT Goals(Current goals can be found in the care plan section) Acute Rehab OT Goals Patient Stated Goal: "I don't  feel confident returning home by myself" OT Goal Formulation: With patient Time For Goal Achievement: 03/20/19 Potential to Achieve Goals: Good ADL Goals Pt Will Perform Grooming: with modified independence;standing Pt Will Perform Lower Body Dressing: with modified independence;with adaptive equipment;sit to/from stand Pt Will Perform Toileting - Clothing Manipulation and hygiene: with modified independence;sit to/from stand Pt/caregiver will Perform Home Exercise Program: Right Upper extremity;With written HEP provided Additional ADL Goal #1: Pt supervision level for all OOB ADL  OT Frequency: Min 2X/week   Barriers to D/C: Decreased caregiver support  spouse home late at night       Co-evaluation              AM-PAC OT "6 Clicks" Daily Activity     Outcome Measure Help from another person eating meals?: A Little Help from another person taking care of personal grooming?: A Little Help from another person toileting, which includes using toliet, bedpan, or urinal?: A Little Help from another person bathing (including washing, rinsing, drying)?: A Lot Help from another person to put on and taking off regular upper body clothing?: A Little Help from another person to put on and taking off regular lower body clothing?: A Lot 6 Click Score: 16   End of Session Nurse Communication: Mobility status  Activity Tolerance: Patient limited by pain Patient left: in chair;with call bell/phone within reach;with chair alarm set  OT Visit Diagnosis: Unsteadiness on feet (R26.81);Muscle weakness (generalized) (M62.81);Pain Pain - Right/Left: Right Pain - part  of body: Arm                Time: 1230-1300 OT Time Calculation (min): 30 min Charges:  OT General Charges $OT Visit: 1 Visit OT Evaluation $OT Eval Moderate Complexity: 1 Mod OT Treatments $Therapeutic Activity: 8-22 mins  Ebony Hail Harold Hedge) Marsa Aris OTR/L Acute Rehabilitation Services Pager: [phone] Office:  [place] 03/06/2019, 1:08 PM

## 2019-03-06 NOTE — Evaluation (Signed)
Physical Therapy Evaluation Patient Details Name: Brittany Rodgers MRN: [id number] DOB: 1971/03/23 Today's Date: 03/06/2019   History of Present Illness  Pt is a 48 y.o. female admitted 03/04/19 with R breast tumor that enlarged significantly on chemo. S/p R mastectomy and node biopsy 6/9. PMH includes breast CA, HLD, IBS.    Clinical Impression  Pt presents with an overall decrease in functional mobility secondary to above. PTA, pt indep and lives alone. Educ on precautions, positioning, gentle UE/cervical ROM, and importance of mobility. Today, pt able to transfer and ambulate at supervision-level. Pt limited by RUE, chest and back pain; discussed recommendation for energy conservation and movement strategies for pain reduction, especially when performing ADL tasks. Pt would benefit from continued acute PT services to maximize functional mobility and independence prior to d/c with HHPT services.     Follow Up Recommendations Home health PT;Supervision - Intermittent    Equipment Recommendations  3in1 (PT)    Recommendations for Other Services       Precautions / Restrictions Precautions Precautions: Fall Precaution Comments: s/p R mastectomy, drains and compression wrap Restrictions Weight Bearing Restrictions: No      Mobility  Bed Mobility Overal bed mobility: Needs Assistance Bed Mobility: Rolling;Sidelying to Sit Rolling: Modified independent (Device/Increase time) Sidelying to sit: Modified independent (Device/Increase time)       General bed mobility comments: Significant increased time and effort rolling onto L-side due to R-side cramps. Educ on technique. Ultimately mod indep to perform with HOB elevated (reports she has pillow propped that is equivalent to bed elevated at home; did not want to try with bed flat)  Transfers Overall transfer level: Independent Equipment used: None Transfers: Sit to/from Stand              Ambulation/Gait Ambulation/Gait  assistance: Scientist, forensic (Feet): 40 Feet Assistive device: None;IV Pole Gait Pattern/deviations: Step-through pattern;Decreased stride length Gait velocity: Decreased Gait velocity interpretation: <1.8 ft/sec, indicate of risk for recurrent falls General Gait Details: Slow, fatigued gait with and without pushing IV pole. Supervision as pt reports feeling "woozy from pain meds." Denies dizziness. Declined hallway ambulation  Stairs            Wheelchair Mobility    Modified Rankin (Stroke Patients Only)       Balance Overall balance assessment: Needs assistance   Sitting balance-Leahy Scale: Good       Standing balance-Leahy Scale: Good                               Pertinent Vitals/Pain Pain Assessment: Faces Faces Pain Scale: Hurts even more Pain Location: RUE, R-side chest and back Pain Descriptors / Indicators: Grimacing;Guarding;Sore Pain Intervention(s): Monitored during session;Limited activity within patient's tolerance;Premedicated before session    [place] expects to be discharged to:: Private residence Living Arrangements: Alone Available Help at Discharge: Family;Available PRN/intermittently Type of Home: House Home Access: Stairs to enter Entrance Stairs-Rails: Right Entrance Stairs-Number of Steps: 3 Home Layout: One level Home Equipment: Shower seat Additional Comments: Mother can drive pt home, but unable to provide physical assist    Prior Function Level of Independence: Independent         Comments: Had outpatient PT for R shoulder/lymphedema in 10/2017     Hand Dominance        Extremity/Trunk Assessment   Upper Extremity Assessment Upper Extremity Assessment: RUE deficits/detail RUE Deficits / Details: s/p mastectomy; finger/wrist/elbow motion [facility]  although limited by pain/soreness; shoulder motion limited and guarded with soreness RUE: Unable to fully assess due to pain RUE  Coordination: decreased gross motor    Lower Extremity Assessment Lower Extremity Assessment: Overall WFL for tasks assessed       Communication   Communication: No difficulties  Cognition Arousal/Alertness: Awake/alert Behavior During Therapy: WFL for tasks assessed/performed Overall Cognitive Status: Within Functional Limits for tasks assessed                                        General Comments      Exercises Other Exercises Other Exercises: Gentle R finger/wrist/elbow/shoulder ROM, shoulder rolls, cervical flex/ext/rotation   Assessment/Plan    PT Assessment Patient needs continued PT services  PT Problem List Decreased strength;Decreased range of motion;Decreased activity tolerance;Decreased balance;Decreased mobility;Decreased knowledge of precautions;Pain       PT Treatment Interventions DME instruction;Gait training;Stair training;Functional mobility training;Therapeutic activities;Therapeutic exercise;Balance training;Patient/family education    PT Goals (Current goals can be found in the Care Plan section)  Acute Rehab PT Goals Patient Stated Goal: "I don't feel confident returning home by myself" PT Goal Formulation: With patient Time For Goal Achievement: 03/20/19    Frequency Min 3X/week   Barriers to discharge Decreased caregiver support      Co-evaluation               AM-PAC PT "6 Clicks" Mobility  Outcome Measure Help needed turning from your back to your side while in a flat bed without using bedrails?: None Help needed moving from lying on your back to sitting on the side of a flat bed without using bedrails?: A Little Help needed moving to and from a bed to a chair (including a wheelchair)?: A Little Help needed standing up from a chair using your arms (e.g., wheelchair or bedside chair)?: A Little Help needed to walk in hospital room?: A Little Help needed climbing 3-5 steps with a railing? : A Little 6 Click Score:  19    End of Session   Activity Tolerance: Patient tolerated treatment well;Patient limited by pain Patient left: in chair;with call bell/phone within reach Nurse Communication: Mobility status PT Visit Diagnosis: Other abnormalities of gait and mobility (R26.89);Pain Pain - Right/Left: Right Pain - part of body: Shoulder;Arm    Time: 1004-1030 PT Time Calculation (min) (ACUTE ONLY): 26 min   Charges:   PT Evaluation $PT Eval Moderate Complexity: 1 Mod PT Treatments $Therapeutic Activity: 8-22 mins      Mabeline Caras, PT, DPT Acute Rehabilitation Services  Pager [phone] Office [phone]  Derry Lory 03/06/2019, 11:23 AM

## 2019-03-10 ENCOUNTER — Other Ambulatory Visit: Payer: Self-pay | Admitting: Oncology

## 2019-03-10 NOTE — Progress Notes (Signed)
We got the path back on Brittany Rodgers's right mastectomy.  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  Repeat prognostic panel is again triple negative.  We have an order for pembrolizumab but I do not know whether it has been rejected or approved by insurance.  I do not know if she qualifies for S14 18 but I have entered the referral.  If all that is negative we will try to obtain [facility] for her from the company.

## 2019-03-14 ENCOUNTER — Telehealth: Payer: Self-pay | Admitting: Oncology

## 2019-03-14 NOTE — Telephone Encounter (Signed)
Added infusion to 7/8 lab/fu. Start time remains the same. Left message for patient.

## 2019-03-18 ENCOUNTER — Other Ambulatory Visit: Payer: Self-pay | Admitting: Family Medicine

## 2019-03-20 ENCOUNTER — Other Ambulatory Visit: Payer: Self-pay | Admitting: Oncology

## 2019-03-21 NOTE — Telephone Encounter (Signed)
Dr. Sarajane Jews please advise on refills of medication.  Thanks

## 2019-03-26 ENCOUNTER — Other Ambulatory Visit: Payer: Self-pay | Admitting: Student

## 2019-03-26 ENCOUNTER — Ambulatory Visit
Admission: RE | Admit: 2019-03-26 | Discharge: 2019-03-26 | Disposition: A | Discharge status: Home / Self Care | Payer: BC Managed Care – PPO | Source: Ambulatory Visit | Attending: Student | Admitting: Student

## 2019-03-26 ENCOUNTER — Other Ambulatory Visit: Payer: Self-pay

## 2019-03-26 ENCOUNTER — Ambulatory Visit: Payer: BC Managed Care – PPO | Admitting: Physical Therapy

## 2019-03-26 DIAGNOSIS — R0602 Shortness of breath: Secondary | ICD-10-CM

## 2019-03-26 MED ORDER — IOPAMIDOL (ISOVUE-370) INJECTION 76%
75.0000 mL | Freq: Once | INTRAVENOUS | Status: AC | PRN
  Administered 2019-03-26: 75 mL via INTRAVENOUS

## 2019-03-27 ENCOUNTER — Ambulatory Visit: Payer: BC Managed Care – PPO | Admitting: Physical Therapy

## 2019-03-31 ENCOUNTER — Encounter: Payer: Self-pay | Admitting: Pharmacy Technician

## 2019-03-31 ENCOUNTER — Other Ambulatory Visit: Payer: Self-pay | Admitting: Oncology

## 2019-03-31 NOTE — Progress Notes (Signed)
The patient is approved for drug assistance by [employer] for [employer].  Enrollment is effective until 02/13/20 and is based on off label use.  Drug replacement will begin on DOS 04/01/19.

## 2019-03-31 NOTE — Progress Notes (Signed)
Brittany Rodgers has been approved for Keytruda.  Hopefully we can get started July 8 but we can delay it if there is any concern postoperatively or if the patient has any concerns in addition.

## 2019-04-01 ENCOUNTER — Encounter: Payer: Self-pay | Admitting: Physical Therapy

## 2019-04-01 ENCOUNTER — Other Ambulatory Visit: Payer: Self-pay

## 2019-04-01 ENCOUNTER — Ambulatory Visit: Payer: BC Managed Care – PPO | Attending: General Surgery | Admitting: Physical Therapy

## 2019-04-01 DIAGNOSIS — M25611 Stiffness of right shoulder, not elsewhere classified: Secondary | ICD-10-CM | POA: Diagnosis not present

## 2019-04-01 DIAGNOSIS — M25511 Pain in right shoulder: Secondary | ICD-10-CM | POA: Insufficient documentation

## 2019-04-01 DIAGNOSIS — M6281 Muscle weakness (generalized): Secondary | ICD-10-CM

## 2019-04-01 DIAGNOSIS — R293 Abnormal posture: Secondary | ICD-10-CM | POA: Insufficient documentation

## 2019-04-01 DIAGNOSIS — R600 Localized edema: Secondary | ICD-10-CM | POA: Diagnosis present

## 2019-04-01 NOTE — Therapy (Signed)
[place], [place], [address] Phone: [phone]   Fax:  [phone]  Physical Therapy Evaluation  Patient Details  Name: Brittany Rodgers MRN: [id number] Date of Birth: 1971-06-07 Referring Provider (PT): Donne Hazel   Encounter Date: 04/01/2019  PT End of Session - 04/01/19 1552    Visit Number  1    Number of Visits  9    Date for PT Re-Evaluation  04/29/19    PT Start Time  1501    PT Stop Time  1540    PT Time Calculation (min)  39 min    Activity Tolerance  Patient tolerated treatment well    Behavior During Therapy  [facility] for tasks assessed/performed       Past Medical History:  Diagnosis Date  . Cancer ([place])    BREAST CANCER RIGHT BREAST  . Chronic cough   . Family history of breast cancer   . GERD (gastroesophageal reflux disease)   . Hyperlipidemia   . IBS (irritable bowel syndrome)    sees Dr. Silvano Rusk   . Routine gynecological examination    sees Dr. Freda Munro    Past Surgical History:  Procedure Laterality Date  . CESAREAN SECTION    . MASTECTOMY W/ SENTINEL NODE BIOPSY Right 03/04/2019   Procedure: RIGHT MASTECTOMY WITH RIGHT AXILLARY SENTINEL LYMPH NODE BIOPSY;  Surgeon: Rolm Bookbinder, MD;  Location: [place];  Service: General;  Laterality: Right;  . PORTACATH PLACEMENT Right 11/14/2018   Procedure: INSERTION PORT-A-CATH WITH ULTRASOUND;  Surgeon: Rolm Bookbinder, MD;  Location: [place];  Service: General;  Laterality: Right;  . ROOT CANAL      There were no vitals filed for this visit.   Subjective Assessment - 04/01/19 1506    Subjective  I have to take Flexeril for the pain and spasms where the surgery was, my armpit and my back. I have trouble when I try to move my arm or lift my arm. My back and my arm hurt together.    Pertinent History  03/04/19- R mastectomy with R SLNB (6 nodes removed and 1 positive), pt is currently  undergoing chemotherapy, pt will begin radiation after chemo    Patient Stated Goals  to be able to lift more than 5 lbs, full shoulder ROM, decrease back pain and under arm pain    Currently in Pain?  Yes    Pain Score  10-Worst pain ever    Pain Location  Back    Pain Orientation  Upper;Right    Pain Descriptors / Indicators  Constant;Sharp    Pain Type  Surgical pain    Pain Onset  More than a month ago    Pain Frequency  Constant    Aggravating Factors   lifting something    Pain Relieving Factors  nothing    Effect of Pain on Daily Activities  unable to do daily activities    Multiple Pain Sites  Yes    Pain Score  10    Pain Location  Chest    Pain Orientation  Right    Pain Descriptors / Indicators  Sharp    Pain Type  Surgical pain    Pain Onset  More than a month ago    Pain Frequency  Constant    Aggravating Factors   activity, moving around    Pain Relieving Factors  meds    Effect of Pain on Daily Activities  unable to  do things, the meds take a while to work and once they do they wear off before the time given to take next dosage         [facility] PT Assessment - 04/01/19 0001      Assessment   Medical Diagnosis  right breast cancer    Referring Provider (PT)  Donne Hazel    Onset Date/Surgical Date  03/04/19    Hand Dominance  Right    Prior Therapy  none      Precautions   Precautions  Other (comment)    Precaution Comments  at risk for lymphedema      Restrictions   Weight Bearing Restrictions  No      Balance Screen   Has the patient fallen in the past 6 months  No    Has the patient had a decrease in activity level because of a fear of falling?   Yes    Is the patient reluctant to leave their home because of a fear of falling?   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, clean up fitting room    Leisure  pt has not been exercising since surgery due to pain      Cognition   Overall Cognitive Status  Within Functional Limits for tasks assessed      Observation/Other Assessments   Observations  pt reports swelling under her R axilla and is currently wearing 2 compression bras, she did not want to remove them today due to pain      ROM / Strength   AROM / PROM / Strength  AROM      AROM   AROM Assessment Site  Shoulder    Right/Left Shoulder  Right;Left    Right Shoulder Flexion  89 Degrees    Right Shoulder ABduction  56 Degrees    Right Shoulder Internal Rotation  --   did not test secondary to pain   Right Shoulder External Rotation  --   did not test secondary to pain   Left Shoulder Flexion  139 Degrees    Left Shoulder ABduction  125 Degrees    Left Shoulder Internal Rotation  50 Degrees    Left Shoulder External Rotation  86 Degrees        LYMPHEDEMA/ONCOLOGY QUESTIONNAIRE - 04/01/19 1526      Type   Cancer Type  right breast cancer      Surgeries   Mastectomy Date  03/04/19    Sentinel Lymph Node Biopsy Date  03/04/19    Number Lymph Nodes Removed  6      Treatment   Active Chemotherapy Treatment  Yes    Past Chemotherapy Treatment  No    Active Radiation Treatment  No   pt will begin after chemo   Past Radiation Treatment  No      What other symptoms do you have   Are you Having Heaviness or Tightness  Yes    Are you having Pain  Yes    Are you having pitting edema  No  Is it Hard or Difficult finding clothes that fit  No    Do you have infections  No    Is there Decreased scar mobility  Yes   pt has pain in this area     Lymphedema Assessments   Lymphedema Assessments  Upper extremities      Right Upper Extremity Lymphedema   15 cm Proximal to Olecranon Process  33.1 cm     Olecranon Process  28 cm    15 cm Proximal to Ulnar Styloid Process  26 cm    Just Proximal to Ulnar Styloid Process  18.1 cm    Across Hand at [employer]  19.3 cm    At [place] of 2nd Digit  6.7 cm      Left Upper Extremity Lymphedema   15 cm Proximal to Olecranon Process  33.9 cm    Olecranon Process  27.5 cm    15 cm Proximal to Ulnar Styloid Process  26.5 cm    Just Proximal to Ulnar Styloid Process  18 cm    Across Hand at [employer]  19.3 cm    At [place] of 2nd Digit  7 cm          Quick Dash - 04/01/19 0001    Open a tight or new jar  Unable    Do heavy household chores (wash walls, wash floors)  Unable    Carry a shopping bag or briefcase  Unable    Wash your back  Unable    Use a knife to cut food  Unable    Recreational activities in which you take some force or impact through your arm, shoulder, or hand (golf, hammering, tennis)  Unable    During the past week, to what extent has your arm, shoulder or hand problem interfered with your normal social activities with family, friends, neighbors, or groups?  Extremely    During the past week, to what extent has your arm, shoulder or hand problem limited your work or other regular daily activities  Extremely    Arm, shoulder, or hand pain.  Extreme    Tingling (pins and needles) in your arm, shoulder, or hand  None    Difficulty Sleeping  Severe difficulty    DASH Score  88.64 %        Objective measurements completed on examination: See above findings.              PT Education - 04/01/19 1551    Education Details  anatomy and physiology of lymphatic system, lymphedema risk reduction practices, post op breast exercises    Person(s) Educated  Patient    Methods  Explanation;Handout    Comprehension  Verbalized understanding          PT Long Term Goals - 04/01/19 1600      PT LONG TERM GOAL #1   Title  Pt will demonstrate 140 degrees of right shoulder flexion to allow her to reach items  overhead    Baseline  89    Time  4    Period  Weeks    Status  New    Target Date  04/29/19      PT LONG TERM GOAL #2   Title  Pt will demonstate 130 degrees of right shoulder abduction to allow her to reach out to the side    Baseline  56    Time  4    Period  Weeks    Status  New    Target Date  04/29/19      PT LONG TERM GOAL #3   Title  Pt will report a 50% decrease in pain around medial scapula and right lateral trunk to allow improved comfort    Baseline  currently 10/10    Time  4    Period  Weeks    Status  New    Target Date  04/29/19      PT LONG TERM GOAL #4   Title  Pt will be independent in a home exercise program for continued strengthening and stretching    Time  4    Period  Weeks    Status  New    Target Date  04/29/19      PT LONG TERM GOAL #5   Title  Pt will report a 50% decrease in swelling in right lateral trunk to allow improved comfort    Time  4    Period  Weeks    Status  New    Target Date  04/29/19             Plan - 04/01/19 1552    Clinical Impression Statement  Pt presents to PT with severely limited R shoulder ROM and pain following a R mastectomy and SLNB on 03/04/19. She is currently undergoing chemotherapy and will begin radiation once she completes chemo. One node of six was positive. Pt is currently wearing 2 compression bras and did not want to remove them today so therapist could see area of swelling that pt reports in right lateral trunk. Will assess at next session. Pt is limited in all directions of right shoulder flexion and her left shoulder flexion is also limited. She reports periscapular pain that is constant. She also has pain across her chest. Pt would benefit from skilled PT services to increase right shoulder ROM, decrease pain, decrease swelling and progress pt towards independence with a home exercise program.    Personal Factors and Comorbidities  Time since onset of injury/illness/exacerbation     Examination-Activity Limitations  Bathing;Lift;Reach Overhead;Dressing    Examination-Participation Restrictions  Cleaning;Meal Prep;Laundry    Stability/Clinical Decision Making  Evolving/Moderate complexity    Clinical Decision Making  Moderate    Rehab Potential  Good    PT Frequency  2x / week    PT Duration  4 weeks    PT Treatment/Interventions  ADLs/Self Care Home Management;Therapeutic activities;Therapeutic exercise;Manual lymph drainage;Manual techniques;Scar mobilization;Passive range of motion;Taping;Joint Manipulations    PT Next Visit Plan  make chip pack for lateral trunk swelling, begin gentle PROM to R shoulder, begin MLD R lateral trunk    PT Home Exercise Plan  post op breast    Consulted and Agree with Plan of Care  Patient       Patient will benefit from skilled therapeutic intervention in order to improve the following deficits and impairments:  Increased muscle spasms, Decreased range of motion, Decreased scar mobility, Decreased knowledge of precautions, Increased edema, Decreased strength, Increased fascial restricitons, Postural dysfunction, Pain, Impaired UE functional use  Visit Diagnosis: 1. Stiffness of right shoulder, not elsewhere classified   2. Acute pain of right shoulder   3. Muscle weakness (generalized)   4. Localized edema   5. Abnormal posture        Problem List Patient Active Problem List   Diagnosis Date Noted  . Breast cancer, right ([place]) 03/04/2019  . S/P mastectomy 03/04/2019  . Anemia due to antineoplastic chemotherapy 02/04/2019  .  Port-A-Cath in place 01/07/2019  . Family history of breast cancer   . Malignant neoplasm of lower-outer quadrant of right breast of female, estrogen receptor negative ([place]) 11/04/2018  . Thoracic spine pain 05/03/2018  . Hypokalemia 02/13/2017  . Chronic right hip pain 11/26/2015  . Hyperlipidemia 12/23/2010  . ALLERGIC RHINITIS CAUSE UNSPECIFIED 07/24/2008  . GERD 10/14/2007  . IBS 10/14/2007     Allyson Sabal [facility] 04/01/2019, 4:04 PM  [place], [place], [address] Phone: [phone]   Fax:  [phone]  Name: Monserrath Junio MRN: [id number] Date of Birth: 07-31-1971  Manus Gunning, PT 04/01/19 4:04 PM

## 2019-04-01 NOTE — Progress Notes (Signed)
[facility]  Telephone:([phone] Fax:([phone]) [phone]    ID: Brittany Rodgers DOB: 1971-05-02  MR#: [id number]  ONG#:[id number]  Patient Care Team: [name], MD as PCP - [name], MD as Consulting Physician (General Surgery) Ryann Pauli, [name], MD as Consulting Physician (Oncology) [name], MD as Attending Physician (Radiation Oncology) [name], MD as Consulting Physician (Gastroenterology) [name], MD as Consulting Physician (Obstetrics and Gynecology) [name], RN as Oncology Nurse Navigator [name], RN as Oncology Nurse Navigator [name], MD as Consulting Physician (Plastic Surgery) OTHER MD: Dr. Siri Cole (urology)    CHIEF COMPLAINT: Triple negative breast cancer  CURRENT TREATMENT: pembrolizumab, goserelin   INTERVAL HISTORY: Brittany Rodgers returns today for follow-up and treatment of her triple negative breast cancer.  Since her last visit, she underwent right mastectomy with sentinel lymph node biopsy on 03/04/2019 under Dr. Donne Hazel. Pathology from the procedure ([id number]) revealed: invasive ductal carcinoma, grade 3, spanning 10.5 cm; carcinoma involves skeletal muscle and grossly involves overlying skin; ductal carcinoma in situ, high grade; carcinoma is broadly less than 1 mm to posterior margin. Prognostic indicators were again triple negative.  Of the 6 biopsied right lymph nodes, 1 returned positive (1/6).  She was approved for [facility], which we plan to begin today.  She also underwent angio chest CT on 03/26/2019 for shortness of breath. This showed: no demonstrable pulmonary embolus, no thoracic aortic aneurysm or dissection; small pleural effusion on the right with rather minimal pleural effusion on the left; no thoracic adenopathy; mass arising eccentrically from the right lobe of the liver posteriorly.   REVIEW OF SYSTEMS: Brittany reports pain in her chest wall, under her arm, to  her back since surgery, noting position doesn't change it. She takes gabapentin and flexeril. She notes not much relief with the flexeril. Her husband continues to work outside the home, and he mostly does the grocery shopping. He is also doing a lot of the cooking since her surgery. She reports some soreness to her port since surgery, for which she is using the cream we gave her. A detailed review of systems was otherwise entirely negative.   HISTORY OF CURRENT ILLNESS: From the original intake note:  Brittany Rodgers presented with a palpable right breast lump and associated tenderness for approximately 1 week. She called her doctor immediately following  She underwent unilateral right diagnostic mammography with tomography and right breast ultrasonography at [facility] on 10/28/2018 showing: Breast Density Category C. Radiopaque BB was placed at the site of the patient's palpable lump in the central right breast. An irregular hyperdense mass with associated increased trabecular thickening is seen in the deep central right breast. On physical exam, a firm, fixed mass in the central right breast is palpated. Target ultrasound is performed, showing and irregular hypoechoic mass at the 8 o'clock position 3 cm from the nipple. It measures 3.6 x 2.9 x 2.3 cm. There is associated peripheral vascularity. Evaluation of the right axilla demonstrates a single prominent lymph node demonstrating 0.4 cm cortical thickening. The remaining lymph nodes ave normal appearance with a thin, symmetric cortex.   Accordingly on 10/29/2018 she proceeded to biopsy of the right breast area in question. The pathology from this procedure showed ([id number]): invasive ductal carcinoma, grade III. Prognostic indicators significant for: estrogen receptor, 0% negative and progesterone receptor, 0% negative. Proliferation marker Ki67 at 80%. HER2 negative (1+) by immunohistochemistry.  Biopsy of the right axilla was performed on the  same day. The pathology from this procedure showed ([id number]): lymph node, negative for carcinoma.  This was felt to be concordant  The patient's subsequent history is as detailed below.   PAST MEDICAL HISTORY: Past Medical History:  Diagnosis Date  . Cancer ([place])    BREAST CANCER RIGHT BREAST  . Chronic cough   . Family history of breast cancer   . GERD (gastroesophageal reflux disease)   . Hyperlipidemia   . IBS (irritable bowel syndrome)    sees Dr. Silvano Rusk   . Routine gynecological examination    sees Dr. Freda Munro     PAST SURGICAL HISTORY: Past Surgical History:  Procedure Laterality Date  . CESAREAN SECTION    . MASTECTOMY W/ SENTINEL NODE BIOPSY Right 03/04/2019   Procedure: RIGHT MASTECTOMY WITH RIGHT AXILLARY SENTINEL LYMPH NODE BIOPSY;  Surgeon: Rolm Bookbinder, MD;  Location: [place];  Service: General;  Laterality: Right;  . PORTACATH PLACEMENT Right 11/14/2018   Procedure: INSERTION PORT-A-CATH WITH ULTRASOUND;  Surgeon: Rolm Bookbinder, MD;  Location: [place];  Service: General;  Laterality: Right;  . ROOT CANAL       FAMILY HISTORY: Family History  Problem Relation Age of Onset  . Hypertension Other   . Breast cancer Other        mat first cousin's daughter  . Breast cancer Maternal Aunt   . Hypertension Mother   . Breast cancer Maternal Aunt   . Breast cancer Maternal Aunt    Brittany Rodgers's father died from an unknown cause at age 67. [name] died from unknown health complications at age 19. The patient has 4 sisters. Brittany Rodgers has 3 maternal aunts that have been diagnosed with breast cancer. Patient denies anyone in her family having ovarian, prostate, or pancreatic cancer.    GYNECOLOGIC HISTORY:  No LMP recorded. (Menstrual status: Chemotherapy). 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 Her husband, Brittany Rodgers, is a Administrator. The patient has two children, Brittany Rodgers and Brittany Rodgers, from another relationship. Brittany Rodgers is 56, lives in [place]. Brittany Rodgers is 48, lives in [place], and is in [employer].  The patient's husband [name] has a child, Brittany Rodgers, from another relationship; she lives in [place] and is in school. The patient has 5 grandchildren. She attends the [employer].   ADVANCED DIRECTIVES: Brittany's husband, [name], is automatically her healthcare power of attorney.     HEALTH MAINTENANCE: Social History   Tobacco Use  . Smoking status: Never Smoker  . Smokeless tobacco: Never Used  Substance Use Topics  . Alcohol use: No    Alcohol/week: 0.0 standard drinks  . Drug use: No    Colonoscopy: yes  PAP: 02/2018  Bone density: no   Allergies  Allergen Reactions  . Barbiturates Other (See Comments)    Keeps pt awake  . Sulfonamide Derivatives Itching    Current Outpatient Medications  Medication Sig Dispense Refill  . atorvastatin (LIPITOR) 20 MG tablet Take 1 tablet (20 mg total) by mouth daily. 90 tablet 3  . cyclobenzaprine (FLEXERIL) 10 MG tablet Take 1 tablet (10 mg total) by mouth 3 (three) times daily. 30 tablet 2  . gabapentin (NEURONTIN) 300 MG capsule Take 1 capsule (300 mg total) by mouth at bedtime. 90 capsule 1  . gabapentin (NEURONTIN) 300 MG capsule Take  1 capsule (300 mg total) by mouth 2 (two) times daily. 60 capsule 1  . lidocaine-prilocaine (EMLA) cream Apply to affected area once (Patient taking differently: Apply 1 application topically daily as needed (numbing). ) 30 g 3  . loratadine (CLARITIN) 10 MG tablet Take 1 tablet (10 mg total) by mouth daily. 60 tablet 0  . Omeprazole-Sodium Bicarbonate (ZEGERID) 20-1100 MG CAPS capsule Take 1 capsule by mouth daily before breakfast. 90 each 3  . ondansetron (ZOFRAN) 8 MG tablet Take 1 tablet (8 mg total) by mouth every 8  (eight) hours as needed for nausea or vomiting. 20 tablet 0  . oxyCODONE (OXY IR/ROXICODONE) 5 MG immediate release tablet Take 1 tablet (5 mg total) by mouth every 4 (four) hours as needed for moderate pain. 15 tablet 0  . polyethylene glycol (MIRALAX / GLYCOLAX) packet Take 17 g by mouth as needed. (Patient taking differently: Take 17 g by mouth daily as needed (constipation). Mix in 8 oz liquid and drink) 100 each 5  . potassium chloride (K-DUR) 10 MEQ tablet TAKE 1 TABLET BY MOUTH ONCE DAILY (Patient taking differently: Take 10 mEq by mouth daily. ) 90 tablet 3  . prochlorperazine (COMPAZINE) 10 MG tablet Take 1 tablet (10 mg total) by mouth every 6 (six) hours as needed (Nausea or vomiting). 30 tablet 1  . traMADol (ULTRAM) 50 MG tablet Take 1-2 tablets (50-100 mg total) by mouth every 6 (six) hours as needed. (Patient taking differently: Take 50-100 mg by mouth every 6 (six) hours as needed for moderate pain. ) 120 tablet 0  . valACYclovir (VALTREX) 500 MG tablet Take 1 tablet (500 mg total) by mouth 2 (two) times daily. 60 tablet 0   No current facility-administered medications for this visit.      OBJECTIVE: Young African-American woman recovering from her recent surgery Vitals:   04/02/19 1151  BP: (!) 148/84  Pulse: (!) 120  Resp: 18  Temp: 98.9 F (37.2 C)  SpO2: 98%     Body mass index is 32.32 kg/m.   Wt Readings from Last 3 Encounters:  04/02/19 176 lb 11.2 oz (80.2 kg)  03/04/19 181 lb 3.5 oz (82.2 kg)  02/18/19 178 lb 9.6 oz (81 kg)   ECOG FS:2 - Symptomatic, <50% confined to bed  Sclerae unicteric, pupils round and equal Wearing a mask No cervical or supraclavicular adenopathy Lungs no rales or rhonchi Heart regular rate and rhythm Abd soft, nontender, positive bowel sounds MSK no focal spinal tenderness, no upper extremity lymphedema Neuro: nonfocal, well oriented, appropriate affect Breasts: The right breast is status post mastectomy.  Steri-Strips are still  in place.  The incision appears to be healing well with no dehiscence or erythema.  There is a small pocket of fluid in the lateral aspect, which does not appear infected or inflamed.  The left breast is unremarkable.  LAB RESULTS:  CMP     Component Value Date/Time   NA 143 04/02/2019 1124   K 3.6 04/02/2019 1124   CL 101 04/02/2019 1124   CO2 31 04/02/2019 1124   GLUCOSE 112 (H) 04/02/2019 1124   BUN 9 04/02/2019 1124   CREATININE 0.89 04/02/2019 1124   CREATININE 0.73 02/11/2019 0859   CALCIUM 9.8 04/02/2019 1124   PROT 7.6 04/02/2019 1124   ALBUMIN 3.8 04/02/2019 1124   AST 20 04/02/2019 1124   AST 6 (L) 02/11/2019 0859   ALT 31 04/02/2019 1124   ALT 20 02/11/2019 0859   ALKPHOS 130 (  H) 04/02/2019 1124   BILITOT 0.2 (L) 04/02/2019 1124   BILITOT 0.3 02/11/2019 0859   GFRNONAA >60 04/02/2019 1124   GFRNONAA >60 02/11/2019 0859   GFRAA >60 04/02/2019 1124   GFRAA >60 02/11/2019 0859    No results found for: TOTALPROTELP, ALBUMINELP, A1GS, A2GS, BETS, BETA2SER, GAMS, MSPIKE, SPEI  No results found for: KPAFRELGTCHN, LAMBDASER, [facility]  Lab Results  Component Value Date   WBC 4.9 04/02/2019   NEUTROABS 3.1 04/02/2019   HGB 11.4 (L) 04/02/2019   HCT 37.1 04/02/2019   MCV 90.7 04/02/2019   PLT 515 (H) 04/02/2019    _0 @  No results found for: LABCA2  No components found for: [id number]  No results for input(s): INR in the last 168 hours.  No results found for: LABCA2  No results found for: ZRA076  No results found for: AUQ333  No results found for: LKT625  No results found for: CA2729  No components found for: HGQUANT  No results found for: CEA1 / No results found for: CEA1   No results found for: AFPTUMOR  No results found for: CHROMOGRNA  No results found for: PSA1  Appointment on 04/02/2019  Component Date Value Ref Range Status  . WBC 04/02/2019 4.9  4.0 - 10.5 K/uL Final  . RBC 04/02/2019 4.09  3.87 - 5.11 MIL/uL Final  .  Hemoglobin 04/02/2019 11.4* 12.0 - 15.0 g/dL Final  . HCT 04/02/2019 37.1  36.0 - 46.0 % Final  . MCV 04/02/2019 90.7  80.0 - 100.0 fL Final  . MCH 04/02/2019 27.9  26.0 - 34.0 pg Final  . MCHC 04/02/2019 30.7  30.0 - 36.0 g/dL Final  . RDW 04/02/2019 17.4* 11.5 - 15.5 % Final  . Platelets 04/02/2019 515* 150 - 400 K/uL Final  . nRBC 04/02/2019 0.0  0.0 - 0.2 % Final  . Neutrophils Relative % 04/02/2019 62  % Final  . Neutro Abs 04/02/2019 3.1  1.7 - 7.7 K/uL Final  . Lymphocytes Relative 04/02/2019 27  % Final  . Lymphs Abs 04/02/2019 1.3  0.7 - 4.0 K/uL Final  . Monocytes Relative 04/02/2019 6  % Final  . Monocytes Absolute 04/02/2019 0.3  0.1 - 1.0 K/uL Final  . Eosinophils Relative 04/02/2019 5  % Final  . Eosinophils Absolute 04/02/2019 0.2  0.0 - 0.5 K/uL Final  . Basophils Relative 04/02/2019 0  % Final  . Basophils Absolute 04/02/2019 0.0  0.0 - 0.1 K/uL Final  . Immature Granulocytes 04/02/2019 0  % Final  . Abs Immature Granulocytes 04/02/2019 0.01  0.00 - 0.07 K/uL Final   Performed at [facility], [place] [address], [place], [place] [address]  . Sodium 04/02/2019 143  135 - 145 mmol/L Final  . Potassium 04/02/2019 3.6  3.5 - 5.1 mmol/L Final  . Chloride 04/02/2019 101  98 - 111 mmol/L Final  . CO2 04/02/2019 31  22 - 32 mmol/L Final  . Glucose, Bld 04/02/2019 112* 70 - 99 mg/dL Final  . BUN 04/02/2019 9  6 - 20 mg/dL Final  . Creatinine, Ser 04/02/2019 0.89  0.44 - 1.00 mg/dL Final  . Calcium 04/02/2019 9.8  8.9 - 10.3 mg/dL Final  . Total Protein 04/02/2019 7.6  6.5 - 8.1 g/dL Final  . Albumin 04/02/2019 3.8  3.5 - 5.0 g/dL Final  . AST 04/02/2019 20  15 - 41 U/L Final  . ALT 04/02/2019 31  0 - 44 U/L Final  . Alkaline Phosphatase 04/02/2019 130*  38 - 126 U/L Final  . Total Bilirubin 04/02/2019 0.2* 0.3 - 1.2 mg/dL Final  . GFR calc non Af Amer 04/02/2019 >60  >60 mL/min Final  . GFR calc Af Amer 04/02/2019 >60  >60 mL/min Final  . Anion gap  04/02/2019 11  5 - 15 Final   Performed at [facility], [place] [address] [place], [place] [address]    (this displays the last labs from the last 3 days)  No results found for: TOTALPROTELP, ALBUMINELP, A1GS, A2GS, BETS, BETA2SER, GAMS, MSPIKE, SPEI (this displays SPEP labs)  No results found for: KPAFRELGTCHN, LAMBDASER, KAPLAMBRATIO (kappa/lambda light chains)  No results found for: HGBA, HGBA2QUANT, HGBFQUANT, HGBSQUAN (Hemoglobinopathy evaluation)   No results found for: LDH  No results found for: IRON, TIBC, IRONPCTSAT (Iron and TIBC)  No results found for: FERRITIN  Urinalysis    Component Value Date/Time   COLORURINE STRAW (A) 12/06/2018 1014   APPEARANCEUR CLEAR 12/06/2018 1014   LABSPEC 1.009 12/06/2018 1014   PHURINE 7.0 12/06/2018 1014   GLUCOSEU NEGATIVE 12/06/2018 1014   HGBUR SMALL (A) 12/06/2018 1014   BILIRUBINUR NEGATIVE 12/06/2018 1014   BILIRUBINUR negative 05/31/2018 1011   KETONESUR NEGATIVE 12/06/2018 1014   PROTEINUR NEGATIVE 12/06/2018 1014   UROBILINOGEN 0.2 05/31/2018 1011   UROBILINOGEN 0.2 05/18/2010 1042   NITRITE NEGATIVE 12/06/2018 1014   LEUKOCYTESUR NEGATIVE 12/06/2018 1014     STUDIES:  Ct Angio Chest Pe W Or Wo Contrast  Result Date: 03/26/2019 CLINICAL DATA:  Recent mastectomy for breast carcinoma. Shortness of breath. EXAM: CT ANGIOGRAPHY CHEST WITH CONTRAST TECHNIQUE: Multidetector CT imaging of the chest was performed using the standard protocol during bolus administration of intravenous contrast. Multiplanar CT image reconstructions and MIPs were obtained to evaluate the vascular anatomy. CONTRAST:  2m ISOVUE-370 IOPAMIDOL (ISOVUE-370) INJECTION 76% COMPARISON:  Chest CT November 15, 2018 FINDINGS: Cardiovascular: There is no demonstrable pulmonary embolus. There is no thoracic aortic aneurysm or dissection. The visualized great vessels appear unremarkable. Port-A-Cath tip is in the superior vena cava. There  is no pericardial effusion or pericardial thickening evident. Mediastinum/Nodes: Thyroid appears unremarkable. There is no appreciable thoracic adenopathy. No esophageal lesions are evident. Lungs/Pleura: There is a fairly small pleural effusion on the right with a rather minimal pleural effusion on the left. There is consolidation with atelectasis in the posterior segments of each lower lobe. There is also mild consolidation in the inferior lingula. No pneumothorax. No pulmonary nodular lesions evident. Upper Abdomen: There is a mass arising eccentrically from the liver measuring 4.1 x 2.4 cm, an apparent metastatic lesion. Visualized upper abdominal structures otherwise appear unremarkable. Musculoskeletal: No blastic or lytic bone lesions are evident. There is postoperative change in the right breast at the site of recent mastectomy. There is a fluid collection within the right chest region anterior to the muscles in the area of mastectomy measuring 2.4 x 2.2 cm. This structure represents either a seroma or liquefying hematoma. Review of the MIP images confirms the above findings. IMPRESSION: 1. No demonstrable pulmonary embolus. No thoracic aortic aneurysm or dissection. 2. Small pleural effusion on the right with rather minimal pleural effusion on the left. Consolidation noted in both posterior lung bases as well as in the inferior lingula. Suspect atelectasis with likely superimposed pneumonia in these areas. 3.  No thoracic adenopathy. 4. Mass arising eccentrically from the right lobe of the liver posteriorly, felt to represent a metastatic focus, also present on most recent CT. 5. Seroma versus  liquefying hematoma and postoperative region right breast measuring 2.4 x 2.2 cm. Electronically Signed   By: Lowella Grip III M.D.   On: 03/26/2019 13:57   Nm Sentinel Node Inj-no Rpt (breast)  Result Date: 03/04/2019 Sulfur colloid was injected by the nuclear medicine technologist for melanoma sentinel node.      ELIGIBLE FOR AVAILABLE RESEARCH PROTOCOL: Declined   ASSESSMENT: 48 y.o. [place], [place] woman status post right breast lower outer quadrant biopsy 10/29/2018 for a clinical T2 N0, stage IIB invasive ductal carcinoma, grade 3, triple negative, with an MIB-1 of 80%  (a) CT of the chest 11/15/2018 shows possible liver metastases, but liver biopsy 11/25/2018 consistent with hemangioma  (b) bone scan 11/15/2018 shows no suspicious bone lesions  (1) neoadjuvant chemotherapy consisting of doxorubicin and cyclophosphamide in dose dense fashion x4, started 11/26/2018, completed 01/07/2019, followed by weekly paclitaxel and carboplatin x12 starting 01/28/2019  (2) patient desires ovarian function preservation:   (a) Zoladex started 11/08/2018, discontinued 02/04/2019  (b) patient is status post bilateral tubal ligation  (3) status post right mastectomy with sentinel lymph node sampling 03/04/2019 for a ypTac ypN1a invasive ductal carcinoma grade 3, repeat prognostic panel again triple negative  (a) a total of 6 axillary lymph nodes removed, 2 sentinel ones, 1 positive  (4) adjuvant radiation pending  (5) genetics counseling on 11/28/2018, testing was recommended, but declined by patient.  (6) Staging studies:  (a) chest CT scan and bone scan 11/15/2018 show a liver mass, no lung or bone lesions  (b) liver biopsy 11/25/2018 shows only a hemangioma  (c) chest CT Angie 03/26/2019 again shows liver mass, right breast seroma  (6) pembrolizumab started 04/02/2019, repeated every 21 days   PLAN: [name] is recovering from her surgery and is ready to start her immunotherapy.  Today we discussed the possible toxicities side effects and complications of this agent, which many patients tolerate quite well, essentially with no side effects, while others can have hypothyroidism, diarrhea, or rash as the most common complications.  Pneumonitis, hepatitis, and other more severe problems can also  develop.  I have asked Johnny to keep an eye out for any unusual symptoms including eating any mild symptoms that are persistent.  Otherwise we are starting today and the plan is to continue that a minimum of a year, more likely 2 years, assuming we have no evidence of disease progression in the meantime  She does need to proceed to adjuvant radiation and I am making that appointment for her.  If she would benefit from capecitabine sensitization I think we could continue the pembrolizumab right through those treatments  Eventually she will want reconstruction.  This would have to be sometime next year  She does not feel she can return to work at least until radiation is done and we are making the appropriate calls to help her with that  Today I refilled her oxycodone.  She was warned regarding the possibility of constipation nausea and confusion from that medication.      Sophea Rackham, [name], MD  04/02/19 12:44 PM Medical Oncology and Hematology [facility] [address] [place], [place] [address] Tel. [phone]    Fax. [phone]   I, Wilburn Mylar, am acting as scribe for Dr. [name]. Chirstopher Iovino.  I, Lurline Del MD, have reviewed the above documentation for accuracy and completeness, and I agree with the above.

## 2019-04-02 ENCOUNTER — Inpatient Hospital Stay: Payer: BC Managed Care – PPO | Attending: Adult Health | Admitting: Oncology

## 2019-04-02 ENCOUNTER — Telehealth: Payer: Self-pay

## 2019-04-02 ENCOUNTER — Inpatient Hospital Stay: Payer: BC Managed Care – PPO

## 2019-04-02 ENCOUNTER — Other Ambulatory Visit: Payer: Self-pay

## 2019-04-02 ENCOUNTER — Telehealth: Payer: Self-pay | Admitting: Radiation Oncology

## 2019-04-02 VITALS — BP 148/84 | HR 120 | Temp 98.9°F | Resp 18 | Ht 62.0 in | Wt 176.7 lb

## 2019-04-02 VITALS — HR 96

## 2019-04-02 DIAGNOSIS — Z79899 Other long term (current) drug therapy: Secondary | ICD-10-CM | POA: Insufficient documentation

## 2019-04-02 DIAGNOSIS — C773 Secondary and unspecified malignant neoplasm of axilla and upper limb lymph nodes: Secondary | ICD-10-CM | POA: Diagnosis not present

## 2019-04-02 DIAGNOSIS — Z5112 Encounter for antineoplastic immunotherapy: Secondary | ICD-10-CM | POA: Insufficient documentation

## 2019-04-02 DIAGNOSIS — R160 Hepatomegaly, not elsewhere classified: Secondary | ICD-10-CM

## 2019-04-02 DIAGNOSIS — C50511 Malignant neoplasm of lower-outer quadrant of right female breast: Secondary | ICD-10-CM | POA: Diagnosis not present

## 2019-04-02 DIAGNOSIS — Z171 Estrogen receptor negative status [ER-]: Secondary | ICD-10-CM | POA: Diagnosis not present

## 2019-04-02 DIAGNOSIS — R0789 Other chest pain: Secondary | ICD-10-CM

## 2019-04-02 LAB — COMPREHENSIVE METABOLIC PANEL
ALT: 31 U/L (ref 0–44)
AST: 20 U/L (ref 15–41)
Albumin: 3.8 g/dL (ref 3.5–5.0)
Alkaline Phosphatase: 130 U/L — ABNORMAL HIGH (ref 38–126)
Anion gap: 11 (ref 5–15)
BUN: 9 mg/dL (ref 6–20)
CO2: 31 mmol/L (ref 22–32)
Calcium: 9.8 mg/dL (ref 8.9–10.3)
Chloride: 101 mmol/L (ref 98–111)
Creatinine, Ser: 0.89 mg/dL (ref 0.44–1.00)
GFR calc Af Amer: >60 mL/min (ref >60)
GFR calc non Af Amer: >60 mL/min (ref >60)
Glucose, Bld: 112 mg/dL — ABNORMAL HIGH (ref 70–99)
Potassium: 3.6 mmol/L (ref 3.5–5.1)
Sodium: 143 mmol/L (ref 135–145)
Total Bilirubin: 0.2 mg/dL — ABNORMAL LOW (ref 0.3–1.2)
Total Protein: 7.6 g/dL (ref 6.5–8.1)

## 2019-04-02 LAB — CBC WITH DIFFERENTIAL/PLATELET
Abs Immature Granulocytes: 0.01 10*3/uL (ref 0.00–0.07)
Basophils Absolute: 0 10*3/uL (ref 0.0–0.1)
Basophils Relative: 0 %
Eosinophils Absolute: 0.2 10*3/uL (ref 0.0–0.5)
Eosinophils Relative: 5 %
HCT: 37.1 % (ref 36.0–46.0)
Hemoglobin: 11.4 g/dL — ABNORMAL LOW (ref 12.0–15.0)
Immature Granulocytes: 0 %
Lymphocytes Relative: 27 %
Lymphs Abs: 1.3 10*3/uL (ref 0.7–4.0)
MCH: 27.9 pg (ref 26.0–34.0)
MCHC: 30.7 g/dL (ref 30.0–36.0)
MCV: 90.7 fL (ref 80.0–100.0)
Monocytes Absolute: 0.3 10*3/uL (ref 0.1–1.0)
Monocytes Relative: 6 %
Neutro Abs: 3.1 10*3/uL (ref 1.7–7.7)
Neutrophils Relative %: 62 %
Platelets: 515 10*3/uL — ABNORMAL HIGH (ref 150–400)
RBC: 4.09 MIL/uL (ref 3.87–5.11)
RDW: 17.4 % — ABNORMAL HIGH (ref 11.5–15.5)
WBC: 4.9 10*3/uL (ref 4.0–10.5)
nRBC: 0 % (ref 0.0–0.2)

## 2019-04-02 LAB — TSH: TSH: 0.954 u[IU]/mL (ref 0.308–3.960)

## 2019-04-02 MED ORDER — SODIUM CHLORIDE 0.9% FLUSH
10.0000 mL | INTRAVENOUS | Status: DC | PRN
  Administered 2019-04-02: 10 mL
  Filled 2019-04-02: qty 10

## 2019-04-02 MED ORDER — SODIUM CHLORIDE 0.9 % IV SOLN
Freq: Once | INTRAVENOUS | Status: AC
  Administered 2019-04-02: 13:00:00 via INTRAVENOUS
  Filled 2019-04-02: qty 250

## 2019-04-02 MED ORDER — HEPARIN SOD (PORK) LOCK FLUSH 100 UNIT/ML IV SOLN
500.0000 [IU] | Freq: Once | INTRAVENOUS | Status: AC | PRN
  Administered 2019-04-02: 500 [IU]
  Filled 2019-04-02: qty 5

## 2019-04-02 MED ORDER — SODIUM CHLORIDE 0.9 % IV SOLN
200.0000 mg | Freq: Once | INTRAVENOUS | Status: AC
  Administered 2019-04-02: 14:00:00 200 mg via INTRAVENOUS
  Filled 2019-04-02: qty 8

## 2019-04-02 MED ORDER — OXYCODONE HCL 5 MG PO TABS: 5.0000 mg | ORAL_TABLET | ORAL | 0 refills | Status: DC | PRN

## 2019-04-02 NOTE — Telephone Encounter (Signed)
Call placed to the [place] center [phone] option 5 "Expected wait time greater than 45 minutes"

## 2019-04-02 NOTE — Telephone Encounter (Signed)
New message: ° ° °LVM for patient to return call to schedule appt from referral received. °

## 2019-04-02 NOTE — Patient Instructions (Signed)
[facility] Discharge Instructions for Patients Receiving Chemotherapy  Today you received the following chemotherapy agents: Keytruda.  To help prevent nausea and vomiting after your treatment, we encourage you to take your nausea medication as directed.   If you develop nausea and vomiting that is not controlled by your nausea medication, call the clinic.   BELOW ARE SYMPTOMS THAT SHOULD BE REPORTED IMMEDIATELY:  *FEVER GREATER THAN 100.5 F  *CHILLS WITH OR WITHOUT FEVER  NAUSEA AND VOMITING THAT IS NOT CONTROLLED WITH YOUR NAUSEA MEDICATION  *UNUSUAL SHORTNESS OF BREATH  *UNUSUAL BRUISING OR BLEEDING  TENDERNESS IN MOUTH AND THROAT WITH OR WITHOUT PRESENCE OF ULCERS  *URINARY PROBLEMS  *BOWEL PROBLEMS  UNUSUAL RASH Items with * indicate a potential emergency and should be followed up as soon as possible.  Feel free to call the clinic should you have any questions or concerns. The clinic phone number is [phone].  Please show the CHEMO ALERT CARD at check-in to the Emergency Department and triage nurse.  Pembrolizumab injection What is this medicine? PEMBROLIZUMAB (pem broe liz ue mab) is a monoclonal antibody. It is used to treat bladder cancer, cervical cancer, endometrial cancer, esophageal cancer, head and neck cancer, hepatocellular cancer, Hodgkin lymphoma, kidney cancer, lymphoma, melanoma, Merkel cell carcinoma, lung cancer, stomach cancer, urothelial cancer, and cancers that have a certain genetic condition. This medicine may be used for other purposes; ask your health care provider or pharmacist if you have questions. COMMON BRAND NAME(S): Keytruda What should I tell my health care provider before I take this medicine? They need to know if you have any of these conditions:  diabetes  immune system problems  inflammatory bowel disease  liver disease  lung or breathing disease  lupus  received or scheduled to receive an organ  transplant or a stem-cell transplant that uses donor stem cells  an unusual or allergic reaction to pembrolizumab, other medicines, foods, dyes, or preservatives  pregnant or trying to get pregnant  breast-feeding How should I use this medicine? This medicine is for infusion into a vein. It is given by a health care professional in a hospital or clinic setting. A special MedGuide will be given to you before each treatment. Be sure to read this information carefully each time. Talk to your pediatrician regarding the use of this medicine in children. While this drug may be prescribed for selected conditions, precautions do apply. Overdosage: If you think you have taken too much of this medicine contact a poison control center or emergency room at once. NOTE: This medicine is only for you. Do not share this medicine with others. What if I miss a dose? It is important not to miss your dose. Call your doctor or health care professional if you are unable to keep an appointment. What may interact with this medicine? Interactions have not been studied. Give your health care provider a list of all the medicines, herbs, non-prescription drugs, or dietary supplements you use. Also tell them if you smoke, drink alcohol, or use illegal drugs. Some items may interact with your medicine. This list may not describe all possible interactions. Give your health care provider a list of all the medicines, herbs, non-prescription drugs, or dietary supplements you use. Also tell them if you smoke, drink alcohol, or use illegal drugs. Some items may interact with your medicine. What should I watch for while using this medicine? Your condition will be monitored carefully while you are receiving this medicine. You may need   blood work done while you are taking this medicine. Do not become pregnant while taking this medicine or for 4 months after stopping it. Women should inform their doctor if they wish to become  pregnant or think they might be pregnant. There is a potential for serious side effects to an unborn child. Talk to your health care professional or pharmacist for more information. Do not breast-feed an infant while taking this medicine or for 4 months after the last dose. What side effects may I notice from receiving this medicine? Side effects that you should report to your doctor or health care professional as soon as possible:  allergic reactions like skin rash, itching or hives, swelling of the face, lips, or tongue  bloody or black, tarry  breathing problems  changes in vision  chest pain  chills  confusion  constipation  cough  diarrhea  dizziness or feeling faint or lightheaded  fast or irregular heartbeat  fever  flushing  hair loss  joint pain  low blood counts - this medicine may decrease the number of white blood cells, red blood cells and platelets. You may be at increased risk for infections and bleeding.  muscle pain  muscle weakness  persistent headache  redness, blistering, peeling or loosening of the skin, including inside the mouth  signs and symptoms of high blood sugar such as dizziness; dry mouth; dry skin; fruity breath; nausea; stomach pain; increased hunger or thirst; increased urination  signs and symptoms of kidney injury like trouble passing urine or change in the amount of urine  signs and symptoms of liver injury like dark urine, light-colored stools, loss of appetite, nausea, right upper belly pain, yellowing of the eyes or skin  sweating  swollen lymph nodes  weight loss Side effects that usually do not require medical attention (report to your doctor or health care professional if they continue or are bothersome):  decreased appetite  muscle pain  tiredness This list may not describe all possible side effects. Call your doctor for medical advice about side effects. You may report side effects to FDA at  1-800-FDA-1088. Where should I keep my medicine? This drug is given in a hospital or clinic and will not be stored at home. NOTE: This sheet is a summary. It may not cover all possible information. If you have questions about this medicine, talk to your doctor, pharmacist, or health care provider.  2020 Elsevier/Gold Standard (2018-10-08 13:46:58)  

## 2019-04-03 ENCOUNTER — Telehealth: Payer: Self-pay | Admitting: Family Medicine

## 2019-04-03 ENCOUNTER — Telehealth: Payer: Self-pay | Admitting: *Deleted

## 2019-04-03 ENCOUNTER — Telehealth: Payer: Self-pay | Admitting: Adult Health

## 2019-04-03 ENCOUNTER — Other Ambulatory Visit: Payer: Self-pay | Admitting: *Deleted

## 2019-04-03 ENCOUNTER — Encounter: Payer: Self-pay | Admitting: *Deleted

## 2019-04-03 LAB — CANCER ANTIGEN 27.29: CA 27.29: 17.6 U/mL (ref 0.0–38.6)

## 2019-04-03 NOTE — Telephone Encounter (Signed)
Post review of note per TRIAGE with MD this RN called pt with his recommendation.  Discussed need for pt to contact Dr Donne Hazel with noted ongoing post surgical pain not relieved with current medications- including a narcotic. Dr Jannifer Rodney refilled with goal that would only be needed sparingly. If pain is continuing despite use - Dr Jannifer Rodney is uncomfortable giving other narcotic without informing surgeon.  Discussed usual decrease in pain at this time and ideally above is a concern which needs to be addressed by appropriate MD.  [name] verbalized understanding of recommendation.   No further needs at this time.

## 2019-04-03 NOTE — Telephone Encounter (Signed)
I left a message regarding schedule I will mail °

## 2019-04-03 NOTE — Telephone Encounter (Signed)
-----   Message from Teodoro Spray, RN sent at 04/02/2019  5:03 PM EDT ----- Regarding: [name] Pt--first time chemo followup [name] pt. First time [place]. Pt tolerated well. Thank you

## 2019-04-03 NOTE — Telephone Encounter (Signed)
Copied from [place] [phone]. Topic: General - Other >> Apr 03, 2019 12:37 PM Keene Breath wrote: Reason for CRM: Patient called to ask the nurse to call her back regarding updating her LOA forms.  CB# [phone]

## 2019-04-03 NOTE — Telephone Encounter (Signed)
TCT patient to follow up with after her 1st dose of Keytruda.  Spoke with patient. She states she feels ok after the [place] but still has problems with post mastectomy pain. Pt was given prescription for oxycodone 5 mg - tablet every 4 hours as needed for pain. She sates that it din;t help much and she did not sleep well last night d/t pain. Advised pt that I would pass that information on to Dr. [name] nurse.  No other c/o at this time

## 2019-04-04 ENCOUNTER — Encounter: Payer: Self-pay | Admitting: Physical Therapy

## 2019-04-04 ENCOUNTER — Ambulatory Visit: Payer: BC Managed Care – PPO | Admitting: Physical Therapy

## 2019-04-04 ENCOUNTER — Encounter

## 2019-04-04 ENCOUNTER — Other Ambulatory Visit: Payer: Self-pay

## 2019-04-04 DIAGNOSIS — M25511 Pain in right shoulder: Secondary | ICD-10-CM

## 2019-04-04 DIAGNOSIS — R600 Localized edema: Secondary | ICD-10-CM

## 2019-04-04 DIAGNOSIS — M25611 Stiffness of right shoulder, not elsewhere classified: Secondary | ICD-10-CM | POA: Diagnosis not present

## 2019-04-04 NOTE — Therapy (Signed)
[place], [place], [address] Phone: [phone]   Fax:  [phone]  Physical Therapy Treatment  Patient Details  Name: Brittany Rodgers MRN: [id number] Date of Birth: April 19, 1971 Referring Provider (PT): Donne Hazel   Encounter Date: 04/04/2019  PT End of Session - 04/04/19 1118    Visit Number  2    Number of Visits  9    Date for PT Re-Evaluation  04/29/19    PT Start Time  1030    PT Stop Time  1115    PT Time Calculation (min)  45 min    Activity Tolerance  Patient limited by pain    Behavior During Therapy  [facility] for tasks assessed/performed       Past Medical History:  Diagnosis Date  . Cancer ([place])    BREAST CANCER RIGHT BREAST  . Chronic cough   . Family history of breast cancer   . GERD (gastroesophageal reflux disease)   . Hyperlipidemia   . IBS (irritable bowel syndrome)    sees Dr. Silvano Rusk   . Routine gynecological examination    sees Dr. Freda Munro    Past Surgical History:  Procedure Laterality Date  . CESAREAN SECTION    . MASTECTOMY W/ SENTINEL NODE BIOPSY Right 03/04/2019   Procedure: RIGHT MASTECTOMY WITH RIGHT AXILLARY SENTINEL LYMPH NODE BIOPSY;  Surgeon: Rolm Bookbinder, MD;  Location: [facility];  Service: General;  Laterality: Right;  . PORTACATH PLACEMENT Right 11/14/2018   Procedure: INSERTION PORT-A-CATH WITH ULTRASOUND;  Surgeon: Rolm Bookbinder, MD;  Location: [facility];  Service: General;  Laterality: Right;  . ROOT CANAL      There were no vitals filed for this visit.  Subjective Assessment - 04/04/19 1032    Subjective  I don't know about the swelling up under my arm. This morning I have not really touched it or anything. It is sensitive to touch.    Pertinent History  03/04/19- R mastectomy with R SLNB (6 nodes removed and 1 positive), pt is currently undergoing chemotherapy, pt will begin radiation after chemo    Patient Stated Goals  to be able to lift more than 5 lbs, full shoulder ROM, decrease back pain and under arm pain    Currently in Pain?  Yes    Pain Score  8     Pain Location  Axilla    Pain Orientation  Right    Pain Descriptors / Indicators  Contraction    Pain Type  Surgical pain                       [facility] Adult PT Treatment/Exercise - 04/04/19 0001      Manual Therapy   Manual Therapy  Manual Lymphatic Drainage (MLD);Edema management;Passive ROM    Edema Management  created foam chip pack for pt to wear on R lateral trunk in area of swelling and assisted pt with placing this in her compression bra    Manual Lymphatic Drainage (MLD)  [place] neck, superficial and deep abdominals, left axillary nodes and establishment of interaxillary anastomosis, right inguinal nodes and establishment of axillo inguinal pathway, then to L sidelying to work on R lateral trunk moving fluid towards pathways, back to supine working on R upper arm working proximal to distal then retracing all steps    Passive ROM  very gently to R shoulder to pt's tolerance, pt had increased pain with any movement of  RUE required frequent v/c to relax R UE                  PT Long Term Goals - 04/01/19 1600      PT LONG TERM GOAL #1   Title  Pt will demonstrate 140 degrees of right shoulder flexion to allow her to reach items overhead    Baseline  89    Time  4    Period  Weeks    Status  New    Target Date  04/29/19      PT LONG TERM GOAL #2   Title  Pt will demonstate 130 degrees of right shoulder abduction to allow her to reach out to the side    Baseline  56    Time  4    Period  Weeks    Status  New    Target Date  04/29/19      PT LONG TERM GOAL #3   Title  Pt will report a 50% decrease in pain around medial scapula and right lateral trunk to allow improved comfort    Baseline  currently 10/10    Time  4    Period  Weeks    Status  New    Target Date  04/29/19      PT LONG  TERM GOAL #4   Title  Pt will be independent in a home exercise program for continued strengthening and stretching    Time  4    Period  Weeks    Status  New    Target Date  04/29/19      PT LONG TERM GOAL #5   Title  Pt will report a 50% decrease in swelling in right lateral trunk to allow improved comfort    Time  4    Period  Weeks    Status  New    Target Date  04/29/19            Plan - 04/04/19 1119    Clinical Impression Statement  Pt had difficulty tolerating session today due to increased pain with all movmenet of RUE. She tends to guard her RUE and hold it close to her. Educated pt that she needs to avoid this because it will lead to increased tightness. Began MLD today but pt also had increased pain with MLD. Focused on right lateral trunk in area of swelling. Then attempted PROM. Pt tolerated flexion more than abduction but had increased pain in chest with both.    Rehab Potential  Good    PT Frequency  2x / week    PT Duration  4 weeks    PT Treatment/Interventions  ADLs/Self Care Home Management;Therapeutic activities;Therapeutic exercise;Manual lymph drainage;Manual techniques;Scar mobilization;Passive range of motion;Taping;Joint Manipulations    PT Next Visit Plan  see how chip pack worked, continue gentle PROM to R shoulder, continue MLD R lateral trunk    Consulted and Agree with Plan of Care  Patient       Patient will benefit from skilled therapeutic intervention in order to improve the following deficits and impairments:  Increased muscle spasms, Decreased range of motion, Decreased scar mobility, Decreased knowledge of precautions, Increased edema, Decreased strength, Increased fascial restricitons, Postural dysfunction, Pain, Impaired UE functional use  Visit Diagnosis: 1. Stiffness of right shoulder, not elsewhere classified   2. Acute pain of right shoulder   3. Localized edema        Problem List Patient Active Problem List  Diagnosis Date  Noted  . Breast cancer, right ([place]) 03/04/2019  . S/P mastectomy 03/04/2019  . Anemia due to antineoplastic chemotherapy 02/04/2019  . Port-A-Cath in place 01/07/2019  . Family history of breast cancer   . Malignant neoplasm of lower-outer quadrant of right breast of female, estrogen receptor negative ([place]) 11/04/2018  . Thoracic spine pain 05/03/2018  . Hypokalemia 02/13/2017  . Chronic right hip pain 11/26/2015  . Hyperlipidemia 12/23/2010  . ALLERGIC RHINITIS CAUSE UNSPECIFIED 07/24/2008  . GERD 10/14/2007  . IBS 10/14/2007    Allyson Sabal [facility] 04/04/2019, [place], [place], [address] Phone: [phone]   Fax:  [phone]  Name: Lesta Limbert MRN: [id number] Date of Birth: 29-Mar-1971  Manus Gunning, PT 04/04/19 11:22 AM

## 2019-04-08 NOTE — Telephone Encounter (Signed)
Patient is calling to check LOA request.  Patient needs an update on forms. Patient would like the fax to say she needs to say LOA for another year.  Patient work fax # [phone] Claim# [phone] [employer] Employee #(WIND) [id number]  Patient call back# [phone]

## 2019-04-09 ENCOUNTER — Other Ambulatory Visit: Payer: Self-pay

## 2019-04-09 ENCOUNTER — Ambulatory Visit: Payer: BC Managed Care – PPO

## 2019-04-09 ENCOUNTER — Encounter: Payer: Self-pay | Admitting: Family Medicine

## 2019-04-09 ENCOUNTER — Ambulatory Visit (INDEPENDENT_AMBULATORY_CARE_PROVIDER_SITE_OTHER): Payer: BC Managed Care – PPO

## 2019-04-09 ENCOUNTER — Ambulatory Visit (INDEPENDENT_AMBULATORY_CARE_PROVIDER_SITE_OTHER): Payer: BC Managed Care – PPO | Admitting: Family Medicine

## 2019-04-09 VITALS — BP 150/98 | HR 124 | Temp 99.0°F | Wt 181.2 lb

## 2019-04-09 DIAGNOSIS — R600 Localized edema: Secondary | ICD-10-CM

## 2019-04-09 DIAGNOSIS — I4892 Unspecified atrial flutter: Secondary | ICD-10-CM

## 2019-04-09 DIAGNOSIS — R0602 Shortness of breath: Secondary | ICD-10-CM

## 2019-04-09 DIAGNOSIS — M25511 Pain in right shoulder: Secondary | ICD-10-CM

## 2019-04-09 DIAGNOSIS — M25611 Stiffness of right shoulder, not elsewhere classified: Secondary | ICD-10-CM

## 2019-04-09 MED ORDER — METOPROLOL SUCCINATE ER 50 MG PO TB24: 50.0000 mg | ORAL_TABLET | Freq: Every day | ORAL | 1 refills | Status: DC

## 2019-04-09 NOTE — Therapy (Signed)
[place], [place], [address] Phone: [phone]   Fax:  [phone]  Physical Therapy Treatment  Patient Details  Name: Brittany Rodgers MRN: [id number] Date of Birth: 24-Mar-1971 Referring Provider (PT): Donne Hazel   Encounter Date: 04/09/2019  PT End of Session - 04/09/19 1523    Visit Number  3    Number of Visits  9    Date for PT Re-Evaluation  04/29/19    PT Start Time  2751    PT Stop Time  1520    PT Time Calculation (min)  52 min    Activity Tolerance  Patient limited by pain    Behavior During Therapy  [facility] for tasks assessed/performed       Past Medical History:  Diagnosis Date  . Cancer ([place])    BREAST CANCER RIGHT BREAST  . Chronic cough   . Family history of breast cancer   . GERD (gastroesophageal reflux disease)   . Hyperlipidemia   . IBS (irritable bowel syndrome)    sees Dr. Silvano Rusk   . Routine gynecological examination    sees Dr. Freda Munro    Past Surgical History:  Procedure Laterality Date  . CESAREAN SECTION    . MASTECTOMY W/ SENTINEL NODE BIOPSY Right 03/04/2019   Procedure: RIGHT MASTECTOMY WITH RIGHT AXILLARY SENTINEL LYMPH NODE BIOPSY;  Surgeon: Rolm Bookbinder, MD;  Location: [facility];  Service: General;  Laterality: Right;  . PORTACATH PLACEMENT Right 11/14/2018   Procedure: INSERTION PORT-A-CATH WITH ULTRASOUND;  Surgeon: Rolm Bookbinder, MD;  Location: [facility];  Service: General;  Laterality: Right;  . ROOT CANAL      Vitals:   04/09/19 1431  SpO2: 100%    Subjective Assessment - 04/09/19 1431    Subjective  My mobility improved some in my Rt shoulder after the last session. I wore the foam chip pack the whole day she gave it to me and it seemed to help but I had trouble getting it back in there.    Pertinent History  03/04/19- R mastectomy with R SLNB (6 nodes removed and 1 positive), pt is currently  undergoing chemotherapy, pt will begin radiation after chemo    Patient Stated Goals  to be able to lift more than 5 lbs, full shoulder ROM, decrease back pain and under arm pain    Currently in Pain?  Yes    Pain Score  9     Pain Location  Shoulder    Pain Orientation  Right    Pain Descriptors / Indicators  Sharp    Pain Type  Surgical pain    Pain Onset  More than a month ago    Pain Frequency  Constant    Aggravating Factors   lifting something    Pain Relieving Factors  pain meds help some, last therapy session helped some                       [facility] Adult PT Treatment/Exercise - 04/09/19 0001      Shoulder Exercises: Pulleys   Flexion  2 minutes    Flexion Limitations  Pt returned therapist demo and tolerated this well    Scaption  2 minutes    Scaption Limitations  Pt returned therapist demo and tolerated this well with reporting no increased pain.      Manual Therapy   Manual Therapy  Manual Lymphatic Drainage (MLD);Edema management;Passive ROM  Manual Lymphatic Drainage (MLD)  [place] neck, superficial and deep abdominals, left axillary nodes and establishment of anterior inter-axillary anastomosis, right inguinal nodes and establishment of Rt axillo-inguinal pathway, then to L sidelying to work on R lateral trunk moving fluid towards pathways, back to supine working on R upper arm working proximal to distal then retracing all steps    Passive ROM  very gently to Rt shoulder to pt's tolerance into flexion, scaption, er and IR                  PT Long Term Goals - 04/01/19 1600      PT LONG TERM GOAL #1   Title  Pt will demonstrate 140 degrees of right shoulder flexion to allow her to reach items overhead    Baseline  89    Time  4    Period  Weeks    Status  New    Target Date  04/29/19      PT LONG TERM GOAL #2   Title  Pt will demonstate 130 degrees of right shoulder abduction to allow her to reach out to the side    Baseline  56     Time  4    Period  Weeks    Status  New    Target Date  04/29/19      PT LONG TERM GOAL #3   Title  Pt will report a 50% decrease in pain around medial scapula and right lateral trunk to allow improved comfort    Baseline  currently 10/10    Time  4    Period  Weeks    Status  New    Target Date  04/29/19      PT LONG TERM GOAL #4   Title  Pt will be independent in a home exercise program for continued strengthening and stretching    Time  4    Period  Weeks    Status  New    Target Date  04/29/19      PT LONG TERM GOAL #5   Title  Pt will report a 50% decrease in swelling in right lateral trunk to allow improved comfort    Time  4    Period  Weeks    Status  New    Target Date  04/29/19            Plan - 04/09/19 1523    Clinical Impression Statement  Had pt warm up with pulleys which she tolerated well without increased pain. Continued with manual therapy as tolerated. Initially she did well with P/ROM, though this was still very limited, she was able to relax better. Towards end of session she reported feeling her shoulder was becoming more irritated so stopped stretching and resumed focus on manual lymph drainage. She was demonstrating SOB while supine during stretching but this eased once finished with P/ROM. Her SpO2 was 100%. It seemed more related to stretching and pt reports this has been occuring off and on since shortly after surgery. Discussed with pt throughout session importance of trying to relax and perhaps SOB was related to being nervous about moving arm. Instructed her techniques to try to include in her day for relaxation like scapular retraction, backward shoulder rolls and to practice deep breathing. Also to try to allow Rt UE to relax at her side instead of holding it in guarded positioning throughout day. Pt verbalized understanding all. She does have an appt after todays session with  her PCP, Dr. Sharlene Motts, to further assess the SOB she has been experiencing.     Personal Factors and Comorbidities  Time since onset of injury/illness/exacerbation    Examination-Activity Limitations  Bathing;Lift;Reach Overhead;Dressing    Examination-Participation Restrictions  Cleaning;Meal Prep;Laundry    Stability/Clinical Decision Making  Evolving/Moderate complexity    Rehab Potential  Good    PT Frequency  2x / week    PT Duration  4 weeks    PT Treatment/Interventions  ADLs/Self Care Home Management;Therapeutic activities;Therapeutic exercise;Manual lymph drainage;Manual techniques;Scar mobilization;Passive range of motion;Taping;Joint Manipulations    PT Next Visit Plan  Continue gentle PROM to Rt shoulder, continue MLD Rt lateral trunk, and cont pulleys. See what her PCP said about SOB.    Consulted and Agree with Plan of Care  Patient       Patient will benefit from skilled therapeutic intervention in order to improve the following deficits and impairments:  Increased muscle spasms, Decreased range of motion, Decreased scar mobility, Decreased knowledge of precautions, Increased edema, Decreased strength, Increased fascial restricitons, Postural dysfunction, Pain, Impaired UE functional use  Visit Diagnosis: 1. Stiffness of right shoulder, not elsewhere classified   2. Acute pain of right shoulder   3. Localized edema        Problem List Patient Active Problem List   Diagnosis Date Noted  . Breast cancer, right ([place]) 03/04/2019  . S/P mastectomy 03/04/2019  . Anemia due to antineoplastic chemotherapy 02/04/2019  . Port-A-Cath in place 01/07/2019  . Family history of breast cancer   . Malignant neoplasm of lower-outer quadrant of right breast of female, estrogen receptor negative ([place]) 11/04/2018  . Thoracic spine pain 05/03/2018  . Hypokalemia 02/13/2017  . Chronic right hip pain 11/26/2015  . Hyperlipidemia 12/23/2010  . ALLERGIC RHINITIS CAUSE UNSPECIFIED 07/24/2008  . GERD 10/14/2007  . IBS 10/14/2007    Otelia Limes,  PTA 04/09/2019, 3:37 PM  [place], [place], [address] Phone: [phone]   Fax:  [phone]  Name: Donnika Kucher MRN: [id number] Date of Birth: [birth year]

## 2019-04-09 NOTE — Progress Notes (Signed)
error 

## 2019-04-09 NOTE — Progress Notes (Addendum)
   Subjective:    Patient ID: Brittany Rodgers, female    DOB: 1971/09/23, 48 y.o.   MRN: [id number]  HPI Here to discuss episodic SOB on exertion. On 03-04-19 she had a right mastectomy for beast cancer, along with some chemotherapy. She says that she has felt this SOB ever since her surgery. In fact on review of her DC summary, some tachycardia and hypertension were noted, but apparently there was no workup for this. She did not have an EKG run. She describes feeling comfortable at rest and she sleeps well at night, but simple activities with mild exertion like going up one flight of steps causes her to be SOB. There is no chest pain associated with this, although she has had chest soreness since the surgery or course. No sweats or nausea. She often feels her heart skip or "flop" very briefly. No pain or swelling in the legs or feet. No fever or cough or NVD or body aches. She had lab work run one week ago which showed a normal TSH, a normal WBC, a Hgb of 11.4, and normal renal function.  Review of Systems  Constitutional: Positive for fatigue. Negative for chills, diaphoresis and fever.  Respiratory: Positive for shortness of breath. Negative for cough, choking, chest tightness and wheezing.   Cardiovascular: Negative.   Gastrointestinal: Negative.   Genitourinary: Negative.   Neurological: Negative.        Objective:   Physical Exam Constitutional:      General: She is not in acute distress.    Appearance: Normal appearance. She is well-developed.  Cardiovascular:     Rate and Rhythm: Regular rhythm. Tachycardia present.     Pulses: Normal pulses.     Heart sounds: Normal heart sounds.     Comments: Her EKG today shows atrial flutter with a pulse rate of 122  Pulmonary:     Effort: Pulmonary effort is normal. No respiratory distress.     Breath sounds: Normal breath sounds. No stridor. No wheezing, rhonchi or rales.     Comments: CXR today is clear  Abdominal:     General: Abdomen  is flat. Bowel sounds are normal. There is no distension.     Palpations: Abdomen is soft. There is no mass.     Tenderness: There is no abdominal tenderness. There is no guarding or rebound.     Hernia: No hernia is present.  Musculoskeletal:     Right lower leg: No edema.     Left lower leg: No edema.  Lymphadenopathy:     Cervical: No cervical adenopathy.  Neurological:     General: No focal deficit present.     Mental Status: She is alert and oriented to person, place, and time.           Assessment & Plan:  Her SOB seems to be related to atrial flutter. We will start her on Metoprolol succinate 50 mg daily and refer her urgently to Cardiology. We will also set up a CT angiogram of the chest to rule out PE. Alysia Penna, MD

## 2019-04-10 ENCOUNTER — Ambulatory Visit: Payer: Self-pay | Admitting: Family Medicine

## 2019-04-10 NOTE — Telephone Encounter (Signed)
Dr. Sarajane Jews did you and the patient discuss this yesterday at her visit.

## 2019-04-10 NOTE — Telephone Encounter (Signed)
No we did not talk about this. I have no idea what she is talking about

## 2019-04-10 NOTE — Telephone Encounter (Signed)
Pt called and stated that she missed a phone call possibly regarding LOA. Pt would like a call back. Please advise

## 2019-04-10 NOTE — Addendum Note (Signed)
Addended by: Alysia Penna A on: 04/10/2019 07:31 AM   Modules accepted: Orders

## 2019-04-11 ENCOUNTER — Emergency Department (HOSPITAL_COMMUNITY): Payer: BC Managed Care – PPO

## 2019-04-11 ENCOUNTER — Encounter: Payer: Self-pay | Admitting: Physical Therapy

## 2019-04-11 ENCOUNTER — Inpatient Hospital Stay (HOSPITAL_COMMUNITY)
Admission: EM | Admit: 2019-04-11 | Discharge: 2019-04-18 | DRG: 598 | Disposition: A | Discharge status: Home Health | Payer: BC Managed Care – PPO | Attending: Internal Medicine | Admitting: Internal Medicine

## 2019-04-11 ENCOUNTER — Other Ambulatory Visit: Payer: Self-pay

## 2019-04-11 ENCOUNTER — Ambulatory Visit: Payer: BC Managed Care – PPO | Admitting: Physical Therapy

## 2019-04-11 ENCOUNTER — Encounter (HOSPITAL_COMMUNITY): Payer: Self-pay | Admitting: Emergency Medicine

## 2019-04-11 DIAGNOSIS — E785 Hyperlipidemia, unspecified: Secondary | ICD-10-CM | POA: Diagnosis present

## 2019-04-11 DIAGNOSIS — Z888 Allergy status to other drugs, medicaments and biological substances status: Secondary | ICD-10-CM | POA: Diagnosis not present

## 2019-04-11 DIAGNOSIS — Z803 Family history of malignant neoplasm of breast: Secondary | ICD-10-CM | POA: Diagnosis not present

## 2019-04-11 DIAGNOSIS — R600 Localized edema: Secondary | ICD-10-CM

## 2019-04-11 DIAGNOSIS — M6281 Muscle weakness (generalized): Secondary | ICD-10-CM

## 2019-04-11 DIAGNOSIS — Z882 Allergy status to sulfonamides status: Secondary | ICD-10-CM | POA: Diagnosis not present

## 2019-04-11 DIAGNOSIS — J90 Pleural effusion, not elsewhere classified: Secondary | ICD-10-CM

## 2019-04-11 DIAGNOSIS — M25611 Stiffness of right shoulder, not elsewhere classified: Secondary | ICD-10-CM

## 2019-04-11 DIAGNOSIS — K219 Gastro-esophageal reflux disease without esophagitis: Secondary | ICD-10-CM | POA: Diagnosis present

## 2019-04-11 DIAGNOSIS — Z171 Estrogen receptor negative status [ER-]: Secondary | ICD-10-CM

## 2019-04-11 DIAGNOSIS — B370 Candidal stomatitis: Secondary | ICD-10-CM | POA: Diagnosis present

## 2019-04-11 DIAGNOSIS — Z1159 Encounter for screening for other viral diseases: Secondary | ICD-10-CM | POA: Diagnosis not present

## 2019-04-11 DIAGNOSIS — C50511 Malignant neoplasm of lower-outer quadrant of right female breast: Secondary | ICD-10-CM | POA: Diagnosis present

## 2019-04-11 DIAGNOSIS — C50919 Malignant neoplasm of unspecified site of unspecified female breast: Secondary | ICD-10-CM

## 2019-04-11 DIAGNOSIS — R0602 Shortness of breath: Secondary | ICD-10-CM

## 2019-04-11 DIAGNOSIS — J910 Malignant pleural effusion: Secondary | ICD-10-CM | POA: Diagnosis present

## 2019-04-11 DIAGNOSIS — R0689 Other abnormalities of breathing: Secondary | ICD-10-CM

## 2019-04-11 DIAGNOSIS — E876 Hypokalemia: Secondary | ICD-10-CM | POA: Diagnosis present

## 2019-04-11 DIAGNOSIS — Z9221 Personal history of antineoplastic chemotherapy: Secondary | ICD-10-CM | POA: Diagnosis not present

## 2019-04-11 DIAGNOSIS — Z95828 Presence of other vascular implants and grafts: Secondary | ICD-10-CM

## 2019-04-11 DIAGNOSIS — Z8249 Family history of ischemic heart disease and other diseases of the circulatory system: Secondary | ICD-10-CM

## 2019-04-11 DIAGNOSIS — I4892 Unspecified atrial flutter: Secondary | ICD-10-CM | POA: Diagnosis present

## 2019-04-11 DIAGNOSIS — Z9011 Acquired absence of right breast and nipple: Secondary | ICD-10-CM | POA: Diagnosis not present

## 2019-04-11 DIAGNOSIS — M25511 Pain in right shoulder: Secondary | ICD-10-CM

## 2019-04-11 DIAGNOSIS — K5900 Constipation, unspecified: Secondary | ICD-10-CM | POA: Diagnosis not present

## 2019-04-11 DIAGNOSIS — E8770 Fluid overload, unspecified: Secondary | ICD-10-CM | POA: Diagnosis not present

## 2019-04-11 DIAGNOSIS — K1230 Oral mucositis (ulcerative), unspecified: Secondary | ICD-10-CM

## 2019-04-11 DIAGNOSIS — Z9010 Acquired absence of unspecified breast and nipple: Secondary | ICD-10-CM

## 2019-04-11 DIAGNOSIS — Z9889 Other specified postprocedural states: Secondary | ICD-10-CM

## 2019-04-11 DIAGNOSIS — K589 Irritable bowel syndrome without diarrhea: Secondary | ICD-10-CM | POA: Diagnosis present

## 2019-04-11 LAB — I-STAT CHEM 8, ED
BUN: 7 mg/dL (ref 6–20)
Calcium, Ion: 1.17 mmol/L (ref 1.15–1.40)
Chloride: 104 mmol/L (ref 98–111)
Creatinine, Ser: 0.6 mg/dL (ref 0.44–1.00)
Glucose, Bld: 106 mg/dL — ABNORMAL HIGH (ref 70–99)
HCT: 32 % — ABNORMAL LOW (ref 36.0–46.0)
Hemoglobin: 10.9 g/dL — ABNORMAL LOW (ref 12.0–15.0)
Potassium: 3.3 mmol/L — ABNORMAL LOW (ref 3.5–5.1)
Sodium: 140 mmol/L (ref 135–145)
TCO2: 27 mmol/L (ref 22–32)

## 2019-04-11 LAB — BASIC METABOLIC PANEL
Anion gap: 12 (ref 5–15)
BUN: 11 mg/dL (ref 6–20)
CO2: 25 mmol/L (ref 22–32)
Calcium: 9.2 mg/dL (ref 8.9–10.3)
Chloride: 105 mmol/L (ref 98–111)
Creatinine, Ser: 0.73 mg/dL (ref 0.44–1.00)
GFR calc Af Amer: >60 mL/min (ref >60)
GFR calc non Af Amer: >60 mL/min (ref >60)
Glucose, Bld: 105 mg/dL — ABNORMAL HIGH (ref 70–99)
Potassium: 3.3 mmol/L — ABNORMAL LOW (ref 3.5–5.1)
Sodium: 142 mmol/L (ref 135–145)

## 2019-04-11 LAB — HCG, QUANTITATIVE, PREGNANCY: hCG, Beta Chain, Quant, S: <1 m[IU]/mL (ref <5)

## 2019-04-11 LAB — CBC WITH DIFFERENTIAL/PLATELET
Abs Immature Granulocytes: 0.04 10*3/uL (ref 0.00–0.07)
Basophils Absolute: 0 10*3/uL (ref 0.0–0.1)
Basophils Relative: 0 %
Eosinophils Absolute: 0.4 10*3/uL (ref 0.0–0.5)
Eosinophils Relative: 4 %
HCT: 33.5 % — ABNORMAL LOW (ref 36.0–46.0)
Hemoglobin: 10.2 g/dL — ABNORMAL LOW (ref 12.0–15.0)
Immature Granulocytes: 0 %
Lymphocytes Relative: 19 %
Lymphs Abs: 1.9 10*3/uL (ref 0.7–4.0)
MCH: 27.5 pg (ref 26.0–34.0)
MCHC: 30.4 g/dL (ref 30.0–36.0)
MCV: 90.3 fL (ref 80.0–100.0)
Monocytes Absolute: 0.9 10*3/uL (ref 0.1–1.0)
Monocytes Relative: 9 %
Neutro Abs: 6.4 10*3/uL (ref 1.7–7.7)
Neutrophils Relative %: 68 %
Platelets: 423 10*3/uL — ABNORMAL HIGH (ref 150–400)
RBC: 3.71 MIL/uL — ABNORMAL LOW (ref 3.87–5.11)
RDW: 18.2 % — ABNORMAL HIGH (ref 11.5–15.5)
WBC: 9.6 10*3/uL (ref 4.0–10.5)
nRBC: 0 % (ref 0.0–0.2)

## 2019-04-11 LAB — I-STAT BETA HCG BLOOD, ED (MC, WL, AP ONLY): I-stat hCG, quantitative: <5 m[IU]/mL (ref <5)

## 2019-04-11 LAB — TROPONIN I (HIGH SENSITIVITY): Troponin I (High Sensitivity): 19 ng/L — ABNORMAL HIGH (ref <18)

## 2019-04-11 LAB — LACTIC ACID, PLASMA: Lactic Acid, Venous: 1.4 mmol/L (ref 0.5–1.9)

## 2019-04-11 LAB — SARS CORONAVIRUS 2 BY RT PCR (HOSPITAL ORDER, PERFORMED IN ~~LOC~~ HOSPITAL LAB): SARS Coronavirus 2: NEGATIVE

## 2019-04-11 MED ORDER — SODIUM CHLORIDE (PF) 0.9 % IJ SOLN
INTRAMUSCULAR | Status: AC
  Administered 2019-04-11: 22:00:00 1 mL
  Filled 2019-04-11: qty 50

## 2019-04-11 MED ORDER — VANCOMYCIN HCL IN DEXTROSE 1-5 GM/200ML-% IV SOLN
1000.0000 mg | Freq: Once | INTRAVENOUS | Status: AC
  Administered 2019-04-11: 1000 mg via INTRAVENOUS
  Filled 2019-04-11: qty 200

## 2019-04-11 MED ORDER — PIPERACILLIN-TAZOBACTAM 3.375 G IVPB 30 MIN
3.3750 g | Freq: Once | INTRAVENOUS | Status: AC
  Administered 2019-04-11: 3.375 g via INTRAVENOUS
  Filled 2019-04-11: qty 50

## 2019-04-11 MED ORDER — IOHEXOL 350 MG/ML SOLN
100.0000 mL | Freq: Once | INTRAVENOUS | Status: AC | PRN
  Administered 2019-04-11: 100 mL via INTRAVENOUS

## 2019-04-11 NOTE — ED Notes (Signed)
ED TO INPATIENT HANDOFF REPORT  ED Nurse Name and Phone #: [place] [phone]  S Name/Age/Gender Brittany Rodgers 48 y.o. female Room/Bed: WA16/WA16  Code Status   Code Status: Prior  Home/SNF/Other Home Patient oriented to: self, place, time and situation Is this baseline? Yes   Triage Complete: Triage complete  Chief Complaint shob  Triage Note Pt reports had mastectomy on June 9. Reports SOB since, even at rest. Reports her doctor wants her to have a CT scan to make sure no PE.    Allergies Allergies  Allergen Reactions  . Barbiturates Other (See Comments)    Keeps pt awake  . Methocarbamol Itching  . Sulfonamide Derivatives Itching    Level of Care/Admitting Diagnosis ED Disposition    ED Disposition Condition [facility] Area: [place] [100102]  Level of Care: Telemetry [5]  Admit to tele based on following criteria: Complex arrhythmia (Bradycardia/Tachycardia)  Covid Evaluation: Asymptomatic Screening Protocol (No Symptoms)  Diagnosis: Pleural effusion [998338]  Admitting Physician: Elwyn Reach [[id number]]  Attending Physician: Elwyn Reach [[id number]]  Estimated length of stay: past midnight tomorrow  Certification:: I certify this patient will need inpatient services for at least 2 midnights  PT Class (Do Not Modify): Inpatient [101]  PT Acc Code (Do Not Modify): Private [1]       B Medical/Surgery History Past Medical History:  Diagnosis Date  . Cancer ([place])    BREAST CANCER RIGHT BREAST  . Chronic cough   . Family history of breast cancer   . GERD (gastroesophageal reflux disease)   . Hyperlipidemia   . IBS (irritable bowel syndrome)    sees Dr. Silvano Rusk   . Routine gynecological examination    sees Dr. Freda Munro   Past Surgical History:  Procedure Laterality Date  . CESAREAN SECTION    . MASTECTOMY W/ SENTINEL NODE BIOPSY Right 03/04/2019   Procedure: RIGHT MASTECTOMY WITH RIGHT AXILLARY  SENTINEL LYMPH NODE BIOPSY;  Surgeon: Rolm Bookbinder, MD;  Location: [facility] [facility];  Service: General;  Laterality: Right;  . PORTACATH PLACEMENT Right 11/14/2018   Procedure: INSERTION PORT-A-CATH WITH ULTRASOUND;  Surgeon: Rolm Bookbinder, MD;  Location: [facility];  Service: General;  Laterality: Right;  . ROOT CANAL       A IV Location/Drains/Wounds Patient Lines/Drains/Airways Status   Active Line/Drains/Airways    Name:   Placement date:   Placement time:   Site:   Days:   Implanted Port 11/14/18 Right Chest   11/14/18    1204    Chest   148   Closed System Drain 1 Right;Lateral Chest Bulb (JP) 19 Fr.   03/04/19    1610    Chest   38   Closed System Drain 2 Right;Lateral Chest Bulb (JP) 19 Fr.   03/04/19    1610    Chest   38   Incision (Closed) 11/14/18 Chest Right   11/14/18    1208     148   Incision (Closed) 11/14/18 Neck Right   11/14/18    1223     148   Incision (Closed) 03/04/19 Breast Right   03/04/19    1728     38          Intake/Output Last 24 hours  Intake/Output Summary (Last 24 hours) at 04/11/2019 2340 Last data filed at 04/11/2019 2210 Gross per 24 hour  Intake 1 ml  Output -  Net 1  ml    Labs/Imaging Results for orders placed or performed during the hospital encounter of 04/11/19 (from the past 48 hour(s))  CBC with Differential/Platelet     Status: Abnormal   Collection Time: 04/11/19  8:28 PM  Result Value Ref Range   WBC 9.6 4.0 - 10.5 K/uL   RBC 3.71 (L) 3.87 - 5.11 MIL/uL   Hemoglobin 10.2 (L) 12.0 - 15.0 g/dL   HCT 33.5 (L) 36.0 - 46.0 %   MCV 90.3 80.0 - 100.0 fL   MCH 27.5 26.0 - 34.0 pg   MCHC 30.4 30.0 - 36.0 g/dL   RDW 18.2 (H) 11.5 - 15.5 %   Platelets 423 (H) 150 - 400 K/uL   nRBC 0.0 0.0 - 0.2 %   Neutrophils Relative % 68 %   Neutro Abs 6.4 1.7 - 7.7 K/uL   Lymphocytes Relative 19 %   Lymphs Abs 1.9 0.7 - 4.0 K/uL   Monocytes Relative 9 %   Monocytes Absolute 0.9 0.1 - 1.0 K/uL   Eosinophils  Relative 4 %   Eosinophils Absolute 0.4 0.0 - 0.5 K/uL   Basophils Relative 0 %   Basophils Absolute 0.0 0.0 - 0.1 K/uL   Immature Granulocytes 0 %   Abs Immature Granulocytes 0.04 0.00 - 0.07 K/uL    Comment: Performed at [facility], [place] [address] [place], [place] [address]  Basic metabolic panel     Status: Abnormal   Collection Time: 04/11/19  8:28 PM  Result Value Ref Range   Sodium 142 135 - 145 mmol/L   Potassium 3.3 (L) 3.5 - 5.1 mmol/L   Chloride 105 98 - 111 mmol/L   CO2 25 22 - 32 mmol/L   Glucose, Bld 105 (H) 70 - 99 mg/dL   BUN 11 6 - 20 mg/dL   Creatinine, Ser 0.73 0.44 - 1.00 mg/dL   Calcium 9.2 8.9 - 10.3 mg/dL   GFR calc non Af Amer >60 >60 mL/min   GFR calc Af Amer >60 >60 mL/min   Anion gap 12 5 - 15    Comment: Performed at [facility], [place] [address], [place], [place] [address]  Troponin I (High Sensitivity)     Status: Abnormal   Collection Time: 04/11/19  8:28 PM  Result Value Ref Range   Troponin I (High Sensitivity) 19 (H) <18 ng/L    Comment: (NOTE) Elevated high sensitivity troponin I (hsTnI) values and significant  changes across serial measurements may suggest ACS but many other  chronic and acute conditions are known to elevate hsTnI results.  Refer to the "Links" section for chest pain algorithms and additional  guidance. Performed at [facility], [place] [address], [place], [place] [address]   SARS Coronavirus 2 (CEPHEID - Performed in [place] hospital lab), Hosp Order     Status: None   Collection Time: 04/11/19  8:28 PM   Specimen: Nasopharyngeal Swab  Result Value Ref Range   SARS Coronavirus 2 NEGATIVE NEGATIVE    Comment: (NOTE) If result is NEGATIVE SARS-CoV-2 target nucleic acids are NOT DETECTED. The SARS-CoV-2 RNA is generally detectable in upper and lower  respiratory specimens during the acute phase of infection. The lowest  concentration of SARS-CoV-2 viral copies  this assay can detect is 250  copies / mL. A negative result does not preclude SARS-CoV-2 infection  and should not be used as the sole basis for treatment or other  patient management decisions.  A  negative result may occur with  improper specimen collection / handling, submission of specimen other  than nasopharyngeal swab, presence of viral mutation(s) within the  areas targeted by this assay, and inadequate number of viral copies  (<250 copies / mL). A negative result must be combined with clinical  observations, patient history, and epidemiological information. If result is POSITIVE SARS-CoV-2 target nucleic acids are DETECTED. The SARS-CoV-2 RNA is generally detectable in upper and lower  respiratory specimens dur ing the acute phase of infection.  Positive  results are indicative of active infection with SARS-CoV-2.  Clinical  correlation with patient history and other diagnostic information is  necessary to determine patient infection status.  Positive results do  not rule out bacterial infection or co-infection with other viruses. If result is PRESUMPTIVE POSTIVE SARS-CoV-2 nucleic acids MAY BE PRESENT.   A presumptive positive result was obtained on the submitted specimen  and confirmed on repeat testing.  While 2019 novel coronavirus  (SARS-CoV-2) nucleic acids may be present in the submitted sample  additional confirmatory testing may be necessary for epidemiological  and / or clinical management purposes  to differentiate between  SARS-CoV-2 and other Sarbecovirus currently known to infect humans.  If clinically indicated additional testing with an alternate test  methodology [phone]) is advised. The SARS-CoV-2 RNA is generally  detectable in upper and lower respiratory sp ecimens during the acute  phase of infection. The expected result is Negative. Fact Sheet for Patients:  StrictlyIdeas.no Fact Sheet for Healthcare  Providers: BankingDealers.co.za This test is not yet approved or cleared by the [place] FDA and has been authorized for detection and/or diagnosis of SARS-CoV-2 by FDA under an Emergency Use Authorization (EUA).  This EUA will remain in effect (meaning this test can be used) for the duration of the COVID-19 declaration under Section 564(b)(1) of the Act, 21 U.S.C. section 360bbb-3(b)(1), unless the authorization is terminated or revoked sooner. Performed at [facility], [place] [address] [place], [place] [address]   Lactic acid, plasma     Status: None   Collection Time: 04/11/19  8:28 PM  Result Value Ref Range   Lactic Acid, Venous 1.4 0.5 - 1.9 mmol/L    Comment: Performed at [facility], [place] [address] [place], [place] [address]  hCG, quantitative, pregnancy     Status: None   Collection Time: 04/11/19  8:28 PM  Result Value Ref Range   hCG, Beta Chain, Quant, S <1 <5 mIU/mL    Comment:          GEST. AGE      CONC.  (mIU/mL)   <=1 WEEK        5 - 50     2 WEEKS       50 - 500     3 WEEKS       100 - 10,000     4 WEEKS     1,000 - 30,000     5 WEEKS     3,500 - 115,000   6-8 WEEKS     12,000 - 270,000    12 WEEKS     15,000 - 220,000        FEMALE AND NON-PREGNANT FEMALE:     LESS THAN 5 mIU/mL Performed at [facility], [place] [address] [place], [place] [address]   I-Stat Beta hCG blood, ED [facility], [facility], [facility] only)     Status: None   Collection Time: 04/11/19  9:16 PM  Result Value Ref Range   I-stat hCG, quantitative <5.0 <5 mIU/mL   Comment 3            Comment:   GEST. AGE      CONC.  (mIU/mL)   <=1 WEEK        5 - 50     2 WEEKS       50 - 500     3 WEEKS       100 - 10,000     4 WEEKS     1,000 - 30,000        FEMALE AND NON-PREGNANT FEMALE:     LESS THAN 5 mIU/mL   I-stat chem 8, ED (not at [facility] or [facility])     Status: Abnormal   Collection Time: 04/11/19  9:17 PM  Result Value Ref Range    Sodium 140 135 - 145 mmol/L   Potassium 3.3 (L) 3.5 - 5.1 mmol/L   Chloride 104 98 - 111 mmol/L   BUN 7 6 - 20 mg/dL   Creatinine, Ser 0.60 0.44 - 1.00 mg/dL   Glucose, Bld 106 (H) 70 - 99 mg/dL   Calcium, Ion 1.17 1.15 - 1.40 mmol/L   TCO2 27 22 - 32 mmol/L   Hemoglobin 10.9 (L) 12.0 - 15.0 g/dL   HCT 32.0 (L) 36.0 - 46.0 %   Ct Angio Chest Pe W And/or Wo Contrast  Result Date: 04/11/2019 CLINICAL DATA:  48 year old female with history of right breast cancer and recent mastectomy presenting with shortness of breath. EXAM: CT ANGIOGRAPHY CHEST WITH CONTRAST TECHNIQUE: Multidetector CT imaging of the chest was performed using the standard protocol during bolus administration of intravenous contrast. Multiplanar CT image reconstructions and MIPs were obtained to evaluate the vascular anatomy. CONTRAST:  167mL OMNIPAQUE IOHEXOL 350 MG/ML SOLN COMPARISON:  Chest CT dated 03/26/2019 and radiograph dated 04/09/2019 and CT dated 11/15/2018 FINDINGS: Cardiovascular: Borderline cardiomegaly. No pericardial effusion. The thoracic aorta is unremarkable. The origins of the great vessels of the aortic arch are patent as visualized. Evaluation of the pulmonary arteries is limited due to respiratory motion artifact and suboptimal visualization of the peripheral branches. No large or central pulmonary artery embolus identified. Mediastinum/Nodes: There is a 6.7 x 5.6 cm soft tissue appearing mass in the right cardiophrenic angle with infiltration of the anterior chest wall and extension into the subpectoral region. This is new compared to the CT of 11/15/2018 and has increased in size since the study of 03/26/2019 where it measured approximately 4.0 x 3.7 cm by my measurements. This may represent a loculated malignant effusion versus a soft tissue metastasis. Although, the lobulated border and infiltration into the anterior chest wall suggest metastasis, significant interval increase in size over the course of 2  weeks is somewhat unusual. This can be better evaluated with ultrasound or MRI. No definite mediastinal or hilar adenopathy identified. The esophagus is grossly unremarkable. Right pectoral Port-A-Cath with tip in the region of the right atrium noted. Lungs/Pleura: There is a moderate right pleural effusion with associated partial compressive atelectasis of the right lower lobe as well as areas of atelectatic changes in the right upper lobe. There has been interval increase in the size of the pleural effusion and associated atelectasis since the prior CT. Linear left lung base atelectasis/scarring as well as a small left pleural effusion noted. There is no pneumothorax. The central airways are patent. Upper Abdomen: Ill-defined partially exophytic 3.2 x 2.7 cm lesion from the posterior right  lobe of the liver as seen previously. There is a 4 mm right renal upper pole calculus. Musculoskeletal: Surgical clips in the right axilla and right mastectomy. Interval resolution of the right axillary fluid/seroma. Biopsy clip in the left breast. No acute osseous pathology. Review of the MIP images confirms the above findings. IMPRESSION: 1. No CT evidence of central pulmonary artery embolus. 2. Interval increase in the size of the right pleural effusion with associated partial compressive atelectasis of the right lower lobe. 3. Interval increase in the size of the right cardiophrenic angle soft tissue mass or loculated collection with infiltration of the anterior chest wall. This may represent a loculated malignant effusion versus a soft tissue metastasis. This can be better evaluated with ultrasound or MRI. 4. Ill-defined partially exophytic lesion from the posterior right lobe of the liver as seen on the prior CT. 5. Interval resolution of the right axillary fluid/seroma. Electronically Signed   By: Anner Crete M.D.   On: 04/11/2019 22:08    Pending Labs Unresulted Labs (From admission, onward)    Start      Ordered   04/11/19 1955  Blood culture (routine x 2)  BLOOD CULTURE X 2,   STAT     04/11/19 1954          Vitals/Pain Today's Vitals   04/11/19 2100 04/11/19 2105 04/11/19 2200 04/11/19 2300  BP: (!) 162/97  (!) 156/92 (!) 160/102  Pulse: 96  (!) 101 96  Resp: (!) 31  (!) 37 (!) 33  Temp:      TempSrc:      SpO2: 100%  97% 98%  Weight:  82.2 kg    Height:  5\' 2"  (1.575 m)      Isolation Precautions No active isolations  Medications Medications  vancomycin (VANCOCIN) IVPB 1000 mg/200 mL premix (1,000 mg Intravenous New Bag/Given 04/11/19 2310)  sodium chloride (PF) 0.9 % injection (1 mL  Given by Other 04/11/19 2210)  iohexol (OMNIPAQUE) 350 MG/ML injection 100 mL (100 mLs Intravenous Contrast Given 04/11/19 2143)  piperacillin-tazobactam (ZOSYN) IVPB 3.375 g (3.375 g Intravenous New Bag/Given 04/11/19 2309)    Mobility walks Low fall risk   Focused Assessments Pulmonary Assessment Handoff:  Lung sounds:  clear O2 Device: Room Air        R Recommendations: See Admitting Provider Note  Report given to: called to give report. Nurse states she will call back.  Additional Notes:

## 2019-04-11 NOTE — ED Triage Notes (Signed)
Pt reports had mastectomy on June 9. Reports SOB since, even at rest. Reports her doctor wants her to have a CT scan to make sure no PE.

## 2019-04-11 NOTE — ED Provider Notes (Signed)
[facility] DEPT Provider Note   CSN: [id number] Arrival date & time: 04/11/19  1748    History   Chief Complaint Chief Complaint  Patient presents with   Shortness of Breath    rule out PE    HPI Rickesha Veracruz is a 48 y.o. female history of breast cancer status post mastectomy, here presenting with new onset a flutter, shortness of breath.  Patient had a mastectomy about a month ago.  Patient is here with fever and shortness of breath for the last 3 to 4 days.  She saw her doctor about 2 days ago and was thought to have new onset a flutter and was referred to cardiology and a CT PE was ordered outpatient.  She was unable to get the CT done and she has worsening fevers and shortness of breath so she came to the ER for evaluation.  She has no known contacts for COVID.  Of note, I did review the EKG from the clinic and it showed sinus rhythm and was erroneously read as a flutter.     The history is provided by the patient.    Past Medical History:  Diagnosis Date   Cancer ([place])    BREAST CANCER RIGHT BREAST   Chronic cough    Family history of breast cancer    GERD (gastroesophageal reflux disease)    Hyperlipidemia    IBS (irritable bowel syndrome)    sees Dr. Silvano Rusk    Routine gynecological examination    sees Dr. Freda Munro    Patient Active Problem List   Diagnosis Date Noted   Breast cancer, right ([place]) 03/04/2019   S/P mastectomy 03/04/2019   Anemia due to antineoplastic chemotherapy 02/04/2019   Port-A-Cath in place 01/07/2019   Family history of breast cancer    Malignant neoplasm of lower-outer quadrant of right breast of female, estrogen receptor negative ([place]) 11/04/2018   Thoracic spine pain 05/03/2018   Hypokalemia 02/13/2017   Chronic right hip pain 11/26/2015   Hyperlipidemia 12/23/2010   ALLERGIC RHINITIS CAUSE UNSPECIFIED 07/24/2008   GERD 10/14/2007   IBS 10/14/2007    Past Surgical  History:  Procedure Laterality Date   CESAREAN SECTION     MASTECTOMY W/ SENTINEL NODE BIOPSY Right 03/04/2019   Procedure: RIGHT MASTECTOMY WITH RIGHT AXILLARY SENTINEL LYMPH NODE BIOPSY;  Surgeon: Rolm Bookbinder, MD;  Location: [facility];  Service: General;  Laterality: Right;   PORTACATH PLACEMENT Right 11/14/2018   Procedure: INSERTION PORT-A-CATH WITH ULTRASOUND;  Surgeon: Rolm Bookbinder, MD;  Location: [facility];  Service: General;  Laterality: Right;   ROOT CANAL       OB History   No obstetric history on file.      Home Medications    Prior to Admission medications   Medication Sig Start Date End Date Taking? Authorizing Provider  atorvastatin (LIPITOR) 20 MG tablet Take 1 tablet (20 mg total) by mouth daily. 05/31/18  Yes [name], MD  cyclobenzaprine (FLEXERIL) 10 MG tablet Take 1 tablet (10 mg total) by mouth 3 (three) times daily. 03/06/19  Yes Rolm Bookbinder, MD  gabapentin (NEURONTIN) 300 MG capsule Take 1 capsule (300 mg total) by mouth at bedtime. 02/26/19  Yes Magrinat, [name], MD  gabapentin (NEURONTIN) 300 MG capsule Take 1 capsule (300 mg total) by mouth 2 (two) times daily. Patient taking differently: Take 600 mg by mouth 2 (two) times daily.  03/06/19  Yes Rolm Bookbinder, MD  lidocaine-prilocaine (EMLA) cream Apply to affected area once Patient taking differently: Apply 1 application topically daily as needed (numbing).  11/15/18  Yes Magrinat, [name], MD  metoprolol succinate (TOPROL-XL) 50 MG 24 hr tablet Take 1 tablet (50 mg total) by mouth daily. Take with or immediately following a meal. 04/09/19  Yes [name], MD  Omeprazole-Sodium Bicarbonate (ZEGERID) 20-1100 MG CAPS capsule Take 1 capsule by mouth daily before breakfast. 05/31/18  Yes [name], MD  oxyCODONE (OXY IR/ROXICODONE) 5 MG immediate release tablet Take 1 tablet (5 mg total) by mouth every 4 (four) hours as needed for moderate pain.  04/02/19  Yes Magrinat, [name], MD  polyethylene glycol (MIRALAX / GLYCOLAX) packet Take 17 g by mouth as needed. Patient taking differently: Take 17 g by mouth daily as needed (constipation). Mix in 8 oz liquid and drink 04/23/12  Yes [name], MD  potassium chloride (K-DUR) 10 MEQ tablet TAKE 1 TABLET BY MOUTH ONCE DAILY Patient taking differently: Take 10 mEq by mouth daily.  11/30/17  Yes [name], MD  loratadine (CLARITIN) 10 MG tablet Take 1 tablet (10 mg total) by mouth daily. Patient not taking: Reported on 04/11/2019 11/15/18   Magrinat, [name], MD  ondansetron (ZOFRAN) 8 MG tablet Take 1 tablet (8 mg total) by mouth every 8 (eight) hours as needed for nausea or vomiting. Patient not taking: Reported on 04/09/2019 02/11/19   Magrinat, [name], MD  prochlorperazine (COMPAZINE) 10 MG tablet Take 1 tablet (10 mg total) by mouth every 6 (six) hours as needed (Nausea or vomiting). Patient not taking: Reported on 04/09/2019 11/15/18   Magrinat, [name], MD  traMADol (ULTRAM) 50 MG tablet Take 1-2 tablets (50-100 mg total) by mouth every 6 (six) hours as needed. Patient not taking: Reported on 04/09/2019 02/18/19   Magrinat, [name], MD  valACYclovir (VALTREX) 500 MG tablet Take 1 tablet (500 mg total) by mouth 2 (two) times daily. Patient not taking: Reported on 04/11/2019 12/31/18   Gardenia Phlegm, NP    Family History Family History  Problem Relation Age of Onset   Hypertension Other    Breast cancer Other        mat first cousin's daughter   Breast cancer Maternal Aunt    Hypertension Mother    Breast cancer Maternal Aunt    Breast cancer Maternal Aunt     Social History Social History   Tobacco Use   Smoking status: Never Smoker   Smokeless tobacco: Never Used  Substance Use Topics   Alcohol use: No    Alcohol/week: 0.0 standard drinks   Drug use: No     Allergies   Barbiturates, Methocarbamol, and Sulfonamide derivatives   Review of  Systems Review of Systems  Respiratory: Positive for shortness of breath.   All other systems reviewed and are negative.    Physical Exam Updated Vital Signs BP (!) 156/92    Pulse (!) 101    Temp 98.6 F (37 C) (Oral)    Resp (!) 37    Ht 5\' 2"  (1.575 m)    Wt 82.2 kg    SpO2 97%    BMI 33.14 kg/m   Physical Exam Vitals signs and nursing note reviewed.  HENT:     Head: Normocephalic.     Mouth/Throat:     Mouth: Mucous membranes are moist.  Eyes:     Extraocular Movements: Extraocular movements intact.     Pupils: Pupils are  equal, round, and reactive to light.  Neck:     Musculoskeletal: Normal range of motion and neck supple.  Cardiovascular:     Rate and Rhythm: Regular rhythm.     Comments: Tachycardic  Pulmonary:     Comments: Slightly tachypneic, diminished R base  Abdominal:     General: Bowel sounds are normal.     Palpations: Abdomen is soft.  Musculoskeletal: Normal range of motion.     Comments: No pedal edema, no calf tenderness   Skin:    General: Skin is warm.     Capillary Refill: Capillary refill takes less than 2 seconds.  Neurological:     General: No focal deficit present.     Mental Status: She is alert and oriented to person, place, and time.  Psychiatric:        Mood and Affect: Mood normal.        Behavior: Behavior normal.      ED Treatments / Results  Labs (all labs ordered are listed, but only abnormal results are displayed) Labs Reviewed  CBC WITH DIFFERENTIAL/PLATELET - Abnormal; Notable for the following components:      Result Value   RBC 3.71 (*)    Hemoglobin 10.2 (*)    HCT 33.5 (*)    RDW 18.2 (*)    Platelets 423 (*)    All other components within normal limits  BASIC METABOLIC PANEL - Abnormal; Notable for the following components:   Potassium 3.3 (*)    Glucose, Bld 105 (*)    All other components within normal limits  I-STAT CHEM 8, ED - Abnormal; Notable for the following components:   Potassium 3.3 (*)     Glucose, Bld 106 (*)    Hemoglobin 10.9 (*)    HCT 32.0 (*)    All other components within normal limits  TROPONIN I (HIGH SENSITIVITY) - Abnormal; Notable for the following components:   Troponin I (High Sensitivity) 19 (*)    All other components within normal limits  SARS CORONAVIRUS 2 (HOSPITAL ORDER, [facility])  CULTURE, BLOOD (ROUTINE X 2)  CULTURE, BLOOD (ROUTINE X 2)  LACTIC ACID, PLASMA  HCG, QUANTITATIVE, PREGNANCY  I-STAT BETA HCG BLOOD, ED ([facility], [facility], [facility] ONLY)    EKG None  Radiology Ct Angio Chest Pe W And/or Wo Contrast  Result Date: 04/11/2019 CLINICAL DATA:  48 year old female with history of right breast cancer and recent mastectomy presenting with shortness of breath. EXAM: CT ANGIOGRAPHY CHEST WITH CONTRAST TECHNIQUE: Multidetector CT imaging of the chest was performed using the standard protocol during bolus administration of intravenous contrast. Multiplanar CT image reconstructions and MIPs were obtained to evaluate the vascular anatomy. CONTRAST:  135mL OMNIPAQUE IOHEXOL 350 MG/ML SOLN COMPARISON:  Chest CT dated 03/26/2019 and radiograph dated 04/09/2019 and CT dated 11/15/2018 FINDINGS: Cardiovascular: Borderline cardiomegaly. No pericardial effusion. The thoracic aorta is unremarkable. The origins of the great vessels of the aortic arch are patent as visualized. Evaluation of the pulmonary arteries is limited due to respiratory motion artifact and suboptimal visualization of the peripheral branches. No large or central pulmonary artery embolus identified. Mediastinum/Nodes: There is a 6.7 x 5.6 cm soft tissue appearing mass in the right cardiophrenic angle with infiltration of the anterior chest wall and extension into the subpectoral region. This is new compared to the CT of 11/15/2018 and has increased in size since the study of 03/26/2019 where it measured approximately 4.0 x 3.7 cm by my measurements. This  may represent a loculated malignant  effusion versus a soft tissue metastasis. Although, the lobulated border and infiltration into the anterior chest wall suggest metastasis, significant interval increase in size over the course of 2 weeks is somewhat unusual. This can be better evaluated with ultrasound or MRI. No definite mediastinal or hilar adenopathy identified. The esophagus is grossly unremarkable. Right pectoral Port-A-Cath with tip in the region of the right atrium noted. Lungs/Pleura: There is a moderate right pleural effusion with associated partial compressive atelectasis of the right lower lobe as well as areas of atelectatic changes in the right upper lobe. There has been interval increase in the size of the pleural effusion and associated atelectasis since the prior CT. Linear left lung base atelectasis/scarring as well as a small left pleural effusion noted. There is no pneumothorax. The central airways are patent. Upper Abdomen: Ill-defined partially exophytic 3.2 x 2.7 cm lesion from the posterior right lobe of the liver as seen previously. There is a 4 mm right renal upper pole calculus. Musculoskeletal: Surgical clips in the right axilla and right mastectomy. Interval resolution of the right axillary fluid/seroma. Biopsy clip in the left breast. No acute osseous pathology. Review of the MIP images confirms the above findings. IMPRESSION: 1. No CT evidence of central pulmonary artery embolus. 2. Interval increase in the size of the right pleural effusion with associated partial compressive atelectasis of the right lower lobe. 3. Interval increase in the size of the right cardiophrenic angle soft tissue mass or loculated collection with infiltration of the anterior chest wall. This may represent a loculated malignant effusion versus a soft tissue metastasis. This can be better evaluated with ultrasound or MRI. 4. Ill-defined partially exophytic lesion from the posterior right lobe of the liver as seen on the prior CT. 5. Interval  resolution of the right axillary fluid/seroma. Electronically Signed   By: Anner Crete M.D.   On: 04/11/2019 22:08    Procedures Procedures (including critical care time)  Medications Ordered in ED Medications  vancomycin (VANCOCIN) IVPB 1000 mg/200 mL premix (has no administration in time range)  piperacillin-tazobactam (ZOSYN) IVPB 3.375 g (has no administration in time range)  sodium chloride (PF) 0.9 % injection (1 mL  Given by Other 04/11/19 2210)  iohexol (OMNIPAQUE) 350 MG/ML injection 100 mL (100 mLs Intravenous Contrast Given 04/11/19 2143)     Initial Impression / Assessment and Plan / ED Course  I have reviewed the triage vital signs and the nursing notes.  Pertinent labs & imaging results that were available during my care of the patient were reviewed by me and considered in my medical decision making (see chart for details).       [name] Eshika Reckart is a 48 y.o. female here presenting with shortness of breath, tachycardia.  Patient was thought to have new onset a flutter several days ago in the clinic.  When I reviewed her EKG from the clinic I think patient only had sinus rhythm and remained in sinus rhythm in the ED today .  Patient did have recent mastectomy and does have shortness of breath and also subjective fevers at home. Her chest x-ray several days ago showed possible right lower lobe pneumonia.  I think she either has pneumonia or pulmonary infarct.  Will do sepsis work-up and get a CTA of the chest.   10:52 PM Patient's CT showed no PE, but there is complex effusion that is loculated. Unclear if it is infectious or she has recurrent cancer. Given IV  abx. Hospitalist to admit and will get thoracentesis tomorrow.   Final Clinical Impressions(s) / ED Diagnoses   Final diagnoses:  None    ED Discharge Orders    None       Drenda Freeze, MD 04/11/19 2256

## 2019-04-11 NOTE — Therapy (Signed)
[place], [place], [address] Phone: [phone]   Fax:  [phone]  Physical Therapy Treatment  Patient Details  Name: Brittany Rodgers MRN: [id number] Date of Birth: 16-Mar-1971 Referring Provider (PT): Donne Hazel   Encounter Date: 04/11/2019  PT End of Session - 04/11/19 1026    Visit Number  4    Number of Visits  9    Date for PT Re-Evaluation  04/29/19    PT Start Time  0931    PT Stop Time  1015    PT Time Calculation (min)  44 min    Activity Tolerance  Patient tolerated treatment well    Behavior During Therapy  [facility] for tasks assessed/performed       Past Medical History:  Diagnosis Date  . Cancer ([place])    BREAST CANCER RIGHT BREAST  . Chronic cough   . Family history of breast cancer   . GERD (gastroesophageal reflux disease)   . Hyperlipidemia   . IBS (irritable bowel syndrome)    sees Dr. Silvano Rusk   . Routine gynecological examination    sees Dr. Freda Munro    Past Surgical History:  Procedure Laterality Date  . CESAREAN SECTION    . MASTECTOMY W/ SENTINEL NODE BIOPSY Right 03/04/2019   Procedure: RIGHT MASTECTOMY WITH RIGHT AXILLARY SENTINEL LYMPH NODE BIOPSY;  Surgeon: Rolm Bookbinder, MD;  Location: [facility];  Service: General;  Laterality: Right;  . PORTACATH PLACEMENT Right 11/14/2018   Procedure: INSERTION PORT-A-CATH WITH ULTRASOUND;  Surgeon: Rolm Bookbinder, MD;  Location: [facility];  Service: General;  Laterality: Right;  . ROOT CANAL      There were no vitals filed for this visit.  Subjective Assessment - 04/11/19 0933    Subjective  I don't know how my swelling is because I can not really get to that area. My arm and shoulder was hurting really bad last night. It kept me up and I was not able to sleep. I could not figure out what was going on with it. I am supposed to see the cardiologist about my shortness of breath on  Aug 12. They are trying to get me a scan asap.    Pertinent History  03/04/19- R mastectomy with R SLNB (6 nodes removed and 1 positive), pt is currently undergoing chemotherapy, pt will begin radiation after chemo    Patient Stated Goals  to be able to lift more than 5 lbs, full shoulder ROM, decrease back pain and under arm pain    Currently in Pain?  Yes    Pain Score  10-Worst pain ever    Pain Location  Chest   and back   Pain Orientation  Right    Pain Descriptors / Indicators  Sharp   nerve pain   Pain Type  Neuropathic pain    Pain Onset  More than a month ago    Pain Frequency  Constant                       [facility] Adult PT Treatment/Exercise - 04/11/19 0001      Exercises   Exercises  Other Exercises    Other Exercises   Educated pt in the following exercises to help decrease muscle tightness and relieve pain and had pt return demonstrate: shoulders rolls with focus on back and down, cervical lateral flexion to left, cervical rotation to L, shoulder squeezes gentle  Shoulder Exercises: Pulleys   Flexion  2 minutes    Scaption  2 minutes      Manual Therapy   Manual Therapy  Soft tissue mobilization    Soft tissue mobilization  since pt's main complaint today is tightness across R upper quadrant and neck causing pain focused today on STM. In L S/L to R upper quadrant with focus on upper traps, R scalenes and SCM which are very tight and R periscapular muscles as well as R serratus anterior in area of tightness. Pt had tightness in all of these muscles. After session pt reported it felt better when she moved.                   PT Long Term Goals - 04/01/19 1600      PT LONG TERM GOAL #1   Title  Pt will demonstrate 140 degrees of right shoulder flexion to allow her to reach items overhead    Baseline  89    Time  4    Period  Weeks    Status  New    Target Date  04/29/19      PT LONG TERM GOAL #2   Title  Pt will demonstate 130 degrees of  right shoulder abduction to allow her to reach out to the side    Baseline  56    Time  4    Period  Weeks    Status  New    Target Date  04/29/19      PT LONG TERM GOAL #3   Title  Pt will report a 50% decrease in pain around medial scapula and right lateral trunk to allow improved comfort    Baseline  currently 10/10    Time  4    Period  Weeks    Status  New    Target Date  04/29/19      PT LONG TERM GOAL #4   Title  Pt will be independent in a home exercise program for continued strengthening and stretching    Time  4    Period  Weeks    Status  New    Target Date  04/29/19      PT LONG TERM GOAL #5   Title  Pt will report a 50% decrease in swelling in right lateral trunk to allow improved comfort    Time  4    Period  Weeks    Status  New    Target Date  04/29/19            Plan - 04/11/19 1026    Clinical Impression Statement  Had pt warm up on pulleys again. She is having increased pain today in her neck, posterior axilla, lateral trunk and lateral scapular on right side. Focused today on soft tissue mobilization to these areas to help decrease pain. Pt had increased tightness in all of those areas that did improve with STM. Her neck including scalenes and SCM were extremely taut and tender. Educated pt in PROM stretches to help alleviate pain and decrease tension throughout the day.    Stability/Clinical Decision Making  Evolving/Moderate complexity    Rehab Potential  Good    PT Frequency  2x / week    PT Duration  4 weeks    PT Treatment/Interventions  ADLs/Self Care Home Management;Therapeutic activities;Therapeutic exercise;Manual lymph drainage;Manual techniques;Scar mobilization;Passive range of motion;Taping;Joint Manipulations    PT Next Visit Plan  Cont STM to tight muscles in  right upper quadrant. Continue gentle PROM to Rt shoulder, continue MLD Rt lateral trunk, and cont pulleys. See what her PCP said about SOB.    Consulted and Agree with Plan of Care   Patient       Patient will benefit from skilled therapeutic intervention in order to improve the following deficits and impairments:  Increased muscle spasms, Decreased range of motion, Decreased scar mobility, Decreased knowledge of precautions, Increased edema, Decreased strength, Increased fascial restricitons, Postural dysfunction, Pain, Impaired UE functional use  Visit Diagnosis: 1. Stiffness of right shoulder, not elsewhere classified   2. Acute pain of right shoulder   3. Localized edema   4. Muscle weakness (generalized)        Problem List Patient Active Problem List   Diagnosis Date Noted  . Breast cancer, right ([place]) 03/04/2019  . S/P mastectomy 03/04/2019  . Anemia due to antineoplastic chemotherapy 02/04/2019  . Port-A-Cath in place 01/07/2019  . Family history of breast cancer   . Malignant neoplasm of lower-outer quadrant of right breast of female, estrogen receptor negative ([place]) 11/04/2018  . Thoracic spine pain 05/03/2018  . Hypokalemia 02/13/2017  . Chronic right hip pain 11/26/2015  . Hyperlipidemia 12/23/2010  . ALLERGIC RHINITIS CAUSE UNSPECIFIED 07/24/2008  . GERD 10/14/2007  . IBS 10/14/2007    Allyson Sabal [facility] 04/11/2019, 10:30 AM  [place], [place], [address] Phone: [phone]   Fax:  [phone]  Name: Brittany Rodgers MRN: [id number] Date of Birth: [birth year]  Manus Gunning, PT 04/11/19 10:30 AM

## 2019-04-11 NOTE — ED Notes (Signed)
Attempted IV x 2 and unsuccessful. Not able to get second set of cultures.

## 2019-04-11 NOTE — ED Notes (Signed)
Called to give nurse report. Nurse states she will call back.

## 2019-04-12 ENCOUNTER — Inpatient Hospital Stay (HOSPITAL_COMMUNITY): Payer: BC Managed Care – PPO

## 2019-04-12 LAB — HIV ANTIBODY (ROUTINE TESTING W REFLEX): HIV Screen 4th Generation wRfx: NONREACTIVE

## 2019-04-12 LAB — ALBUMIN, PLEURAL OR PERITONEAL FLUID: Albumin, Fluid: 2.5 g/dL

## 2019-04-12 LAB — COMPREHENSIVE METABOLIC PANEL
ALT: 30 U/L (ref 0–44)
AST: 11 U/L — ABNORMAL LOW (ref 15–41)
Albumin: 3.3 g/dL — ABNORMAL LOW (ref 3.5–5.0)
Alkaline Phosphatase: 93 U/L (ref 38–126)
Anion gap: 12 (ref 5–15)
BUN: 8 mg/dL (ref 6–20)
CO2: 24 mmol/L (ref 22–32)
Calcium: 8.9 mg/dL (ref 8.9–10.3)
Chloride: 103 mmol/L (ref 98–111)
Creatinine, Ser: 0.68 mg/dL (ref 0.44–1.00)
GFR calc Af Amer: >60 mL/min (ref >60)
GFR calc non Af Amer: >60 mL/min (ref >60)
Glucose, Bld: 110 mg/dL — ABNORMAL HIGH (ref 70–99)
Potassium: 3.4 mmol/L — ABNORMAL LOW (ref 3.5–5.1)
Sodium: 139 mmol/L (ref 135–145)
Total Bilirubin: 0.4 mg/dL (ref 0.3–1.2)
Total Protein: 6.8 g/dL (ref 6.5–8.1)

## 2019-04-12 LAB — BODY FLUID CELL COUNT WITH DIFFERENTIAL
Eos, Fluid: 1 %
Lymphs, Fluid: 10 %
Monocyte-Macrophage-Serous Fluid: 16 % — ABNORMAL LOW (ref 50–90)
Neutrophil Count, Fluid: 73 % — ABNORMAL HIGH (ref 0–25)
Total Nucleated Cell Count, Fluid: 6854 cu mm — ABNORMAL HIGH (ref 0–1000)

## 2019-04-12 LAB — CBC
HCT: 32.9 % — ABNORMAL LOW (ref 36.0–46.0)
Hemoglobin: 9.8 g/dL — ABNORMAL LOW (ref 12.0–15.0)
MCH: 26.7 pg (ref 26.0–34.0)
MCHC: 29.8 g/dL — ABNORMAL LOW (ref 30.0–36.0)
MCV: 89.6 fL (ref 80.0–100.0)
Platelets: 399 10*3/uL (ref 150–400)
RBC: 3.67 MIL/uL — ABNORMAL LOW (ref 3.87–5.11)
RDW: 17.9 % — ABNORMAL HIGH (ref 11.5–15.5)
WBC: 8.8 10*3/uL (ref 4.0–10.5)
nRBC: 0 % (ref 0.0–0.2)

## 2019-04-12 LAB — LACTATE DEHYDROGENASE, PLEURAL OR PERITONEAL FLUID: LD, Fluid: 1777 U/L — ABNORMAL HIGH (ref 3–23)

## 2019-04-12 LAB — AMYLASE, PLEURAL OR PERITONEAL FLUID: Amylase, Fluid: 44 U/L

## 2019-04-12 LAB — PROTEIN, PLEURAL OR PERITONEAL FLUID: Total protein, fluid: 4.1 g/dL

## 2019-04-12 MED ORDER — ONDANSETRON HCL 4 MG PO TABS: 4.0000 mg | ORAL_TABLET | Freq: Four times a day (QID) | ORAL | Status: DC | PRN

## 2019-04-12 MED ORDER — LIDOCAINE HCL 1 % IJ SOLN
INTRAMUSCULAR | Status: AC
  Filled 2019-04-12: qty 20

## 2019-04-12 MED ORDER — HYDROMORPHONE HCL 1 MG/ML IJ SOLN
0.5000 mg | Freq: Once | INTRAMUSCULAR | Status: AC
  Administered 2019-04-13: 0.5 mg via INTRAVENOUS
  Filled 2019-04-12: qty 0.5

## 2019-04-12 MED ORDER — GABAPENTIN 300 MG PO CAPS
300.0000 mg | ORAL_CAPSULE | Freq: Every day | ORAL | Status: DC
  Administered 2019-04-12 – 2019-04-17 (×7): 300 mg via ORAL
  Filled 2019-04-12 (×7): qty 1

## 2019-04-12 MED ORDER — ATORVASTATIN CALCIUM 20 MG PO TABS
20.0000 mg | ORAL_TABLET | Freq: Every day | ORAL | Status: DC
  Administered 2019-04-12 – 2019-04-18 (×7): 20 mg via ORAL
  Filled 2019-04-12: qty 1
  Filled 2019-04-12: qty 2
  Filled 2019-04-12 (×5): qty 1

## 2019-04-12 MED ORDER — MORPHINE SULFATE (PF) 2 MG/ML IV SOLN
2.0000 mg | INTRAVENOUS | Status: DC | PRN
  Administered 2019-04-12 – 2019-04-13 (×4): 2 mg via INTRAVENOUS
  Filled 2019-04-12 (×4): qty 1

## 2019-04-12 MED ORDER — ONDANSETRON HCL 4 MG/2ML IJ SOLN: 4.0000 mg | Freq: Four times a day (QID) | INTRAMUSCULAR | Status: DC | PRN

## 2019-04-12 MED ORDER — POTASSIUM CHLORIDE CRYS ER 10 MEQ PO TBCR
10.0000 meq | EXTENDED_RELEASE_TABLET | Freq: Every day | ORAL | Status: DC
  Administered 2019-04-12 – 2019-04-18 (×7): 10 meq via ORAL
  Filled 2019-04-12 (×7): qty 1

## 2019-04-12 MED ORDER — METOPROLOL SUCCINATE ER 50 MG PO TB24
50.0000 mg | ORAL_TABLET | Freq: Every day | ORAL | Status: DC
  Administered 2019-04-12 – 2019-04-18 (×7): 50 mg via ORAL
  Filled 2019-04-12 (×3): qty 1
  Filled 2019-04-12: qty 2
  Filled 2019-04-12 (×3): qty 1

## 2019-04-12 MED ORDER — SODIUM CHLORIDE 0.9% FLUSH
10.0000 mL | Freq: Two times a day (BID) | INTRAVENOUS | Status: DC
  Administered 2019-04-12 – 2019-04-15 (×3): 10 mL

## 2019-04-12 MED ORDER — SODIUM CHLORIDE 0.9% FLUSH
10.0000 mL | INTRAVENOUS | Status: DC | PRN
  Administered 2019-04-14: 10 mL
  Filled 2019-04-12: qty 40

## 2019-04-12 MED ORDER — PANTOPRAZOLE SODIUM 40 MG PO TBEC
40.0000 mg | DELAYED_RELEASE_TABLET | Freq: Every day | ORAL | Status: DC
  Administered 2019-04-12 – 2019-04-18 (×7): 40 mg via ORAL
  Filled 2019-04-12 (×7): qty 1

## 2019-04-12 MED ORDER — LIDOCAINE-PRILOCAINE 2.5-2.5 % EX CREA
1.0000 "application " | TOPICAL_CREAM | Freq: Every day | CUTANEOUS | Status: DC | PRN
  Filled 2019-04-12: qty 5

## 2019-04-12 MED ORDER — GABAPENTIN 300 MG PO CAPS
600.0000 mg | ORAL_CAPSULE | Freq: Two times a day (BID) | ORAL | Status: DC
  Administered 2019-04-12 – 2019-04-18 (×14): 600 mg via ORAL
  Filled 2019-04-12 (×14): qty 2

## 2019-04-12 MED ORDER — POLYETHYLENE GLYCOL 3350 17 G PO PACK
17.0000 g | PACK | Freq: Every day | ORAL | Status: DC | PRN
  Administered 2019-04-13: 17 g via ORAL
  Filled 2019-04-12 (×2): qty 1

## 2019-04-12 MED ORDER — SODIUM CHLORIDE 0.45 % IV SOLN
INTRAVENOUS | Status: DC
  Administered 2019-04-12 – 2019-04-13 (×2): via INTRAVENOUS

## 2019-04-12 MED ORDER — HEPARIN SODIUM (PORCINE) 5000 UNIT/ML IJ SOLN
5000.0000 [IU] | Freq: Three times a day (TID) | INTRAMUSCULAR | Status: DC
  Administered 2019-04-12 – 2019-04-17 (×15): 5000 [IU] via SUBCUTANEOUS
  Filled 2019-04-12 (×16): qty 1

## 2019-04-12 MED ORDER — OXYCODONE HCL 5 MG PO TABS
5.0000 mg | ORAL_TABLET | ORAL | Status: DC | PRN
  Administered 2019-04-12 – 2019-04-13 (×5): 5 mg via ORAL
  Filled 2019-04-12 (×5): qty 1

## 2019-04-12 MED ORDER — CYCLOBENZAPRINE HCL 10 MG PO TABS
10.0000 mg | ORAL_TABLET | Freq: Three times a day (TID) | ORAL | Status: DC
  Administered 2019-04-12 – 2019-04-18 (×20): 10 mg via ORAL
  Filled 2019-04-12 (×20): qty 1

## 2019-04-12 NOTE — Procedures (Signed)
Interventional Radiology Procedure Note  Procedure: Right thoracentesis yielding 500 mL bloody pleural fluid  Complications: None  Estimated Blood Loss: None  Recommendations: - Labs sent - CXR pending  Signed,  Criselda Peaches, MD

## 2019-04-12 NOTE — H&P (Signed)
History and Physical   [name] WUJ:[id number] DOB: 1971/06/20 DOA: 04/11/2019  Referring MD/NP/PA: Dr Darl Householder  PCP: [name], MD   Outpatient Specialists: [name]  Patient coming from: Home  Chief Complaint: Shortness of breath  HPI: Brittany Rodgers is a 48 y.o. female with medical history significant of right breast cancer status post chemotherapy and mastectomy about a month ago also GERD that came in with shortness of breath and tachycardia.  Patient was tachypneic in the ER.  She was sent over for work-up for possible PE.  She has some chest pain but nothing out of the ordinary.  She has shortness of breath with exertion mainly.  No fever.  No nausea vomiting or diarrhea.  Patient was evaluated with CT angiogram of the chest showing no PE but patient has extensive pleural effusion on the right which looks complex possibly loculated.  Patient is being admitted for evaluation of her pleural effusion and sinus tachycardia..  ED Course: Temperature is 98.6 blood pressure 162/97 pulse 101 respiratory 95 oxygen sats 97% room air.  Sodium 140 potassium 3.3 chloride 105 CO2 25 glucose 105 BUN 11 creatinine 0.73.  White count 9.6 hemoglobin 10.1 platelets 423.  COVID-19 testing was negative.  CT angiogram of the chest showed no PE.  There was interval increase in size of right pleural effusion with associated partial compressive atelectasis of the right lower lobe.  Also right costal phrenic angle blunting.  Patient is therefore being admitted for work-up.  Review of Systems: As per HPI otherwise 10 point review of systems negative.    Past Medical History:  Diagnosis Date   Cancer [facility])    BREAST CANCER RIGHT BREAST   Chronic cough    Family history of breast cancer    GERD (gastroesophageal reflux disease)    Hyperlipidemia    IBS (irritable bowel syndrome)    sees Dr. Silvano Rusk    Routine gynecological examination    sees Dr. Freda Munro    Past  Surgical History:  Procedure Laterality Date   CESAREAN SECTION     MASTECTOMY W/ SENTINEL NODE BIOPSY Right 03/04/2019   Procedure: RIGHT MASTECTOMY WITH RIGHT AXILLARY SENTINEL LYMPH NODE BIOPSY;  Surgeon: Rolm Bookbinder, MD;  Location: [place];  Service: General;  Laterality: Right;   PORTACATH PLACEMENT Right 11/14/2018   Procedure: INSERTION PORT-A-CATH WITH ULTRASOUND;  Surgeon: Rolm Bookbinder, MD;  Location: [place];  Service: General;  Laterality: Right;   ROOT CANAL       reports that she has never smoked. She has never used smokeless tobacco. She reports that she does not drink alcohol or use drugs.  Allergies  Allergen Reactions   Barbiturates Other (See Comments)    Keeps pt awake   Methocarbamol Itching   Sulfonamide Derivatives Itching    Family History  Problem Relation Age of Onset   Hypertension Other    Breast cancer Other        mat first cousin's daughter   Breast cancer Maternal Aunt    Hypertension Mother    Breast cancer Maternal Aunt    Breast cancer Maternal Aunt      Prior to Admission medications   Medication Sig Start Date End Date Taking? Authorizing Provider  atorvastatin (LIPITOR) 20 MG tablet Take 1 tablet (20 mg total) by mouth daily. 05/31/18  Yes [name], MD  cyclobenzaprine (FLEXERIL) 10 MG tablet Take 1 tablet (10 mg total) by mouth 3 (  three) times daily. 03/06/19  Yes Rolm Bookbinder, MD  gabapentin (NEURONTIN) 300 MG capsule Take 1 capsule (300 mg total) by mouth at bedtime. 02/26/19  Yes Magrinat, [name], MD  gabapentin (NEURONTIN) 300 MG capsule Take 1 capsule (300 mg total) by mouth 2 (two) times daily. Patient taking differently: Take 600 mg by mouth 2 (two) times daily.  03/06/19  Yes Rolm Bookbinder, MD  lidocaine-prilocaine (EMLA) cream Apply to affected area once Patient taking differently: Apply 1 application topically daily as needed (numbing).  11/15/18  Yes  Magrinat, [name], MD  metoprolol succinate (TOPROL-XL) 50 MG 24 hr tablet Take 1 tablet (50 mg total) by mouth daily. Take with or immediately following a meal. 04/09/19  Yes [name], MD  Omeprazole-Sodium Bicarbonate (ZEGERID) 20-1100 MG CAPS capsule Take 1 capsule by mouth daily before breakfast. 05/31/18  Yes [name], MD  oxyCODONE (OXY IR/ROXICODONE) 5 MG immediate release tablet Take 1 tablet (5 mg total) by mouth every 4 (four) hours as needed for moderate pain. 04/02/19  Yes Magrinat, [name], MD  polyethylene glycol (MIRALAX / GLYCOLAX) packet Take 17 g by mouth as needed. Patient taking differently: Take 17 g by mouth daily as needed (constipation). Mix in 8 oz liquid and drink 04/23/12  Yes [name], MD  potassium chloride (K-DUR) 10 MEQ tablet TAKE 1 TABLET BY MOUTH ONCE DAILY Patient taking differently: Take 10 mEq by mouth daily.  11/30/17  Yes [name], MD  loratadine (CLARITIN) 10 MG tablet Take 1 tablet (10 mg total) by mouth daily. Patient not taking: Reported on 04/11/2019 11/15/18   Magrinat, [name], MD  ondansetron (ZOFRAN) 8 MG tablet Take 1 tablet (8 mg total) by mouth every 8 (eight) hours as needed for nausea or vomiting. Patient not taking: Reported on 04/09/2019 02/11/19   Magrinat, [name], MD  prochlorperazine (COMPAZINE) 10 MG tablet Take 1 tablet (10 mg total) by mouth every 6 (six) hours as needed (Nausea or vomiting). Patient not taking: Reported on 04/09/2019 11/15/18   Magrinat, [name], MD  traMADol (ULTRAM) 50 MG tablet Take 1-2 tablets (50-100 mg total) by mouth every 6 (six) hours as needed. Patient not taking: Reported on 04/09/2019 02/18/19   Magrinat, [name], MD  valACYclovir (VALTREX) 500 MG tablet Take 1 tablet (500 mg total) by mouth 2 (two) times daily. Patient not taking: Reported on 04/11/2019 12/31/18   Gardenia Phlegm, NP    Physical Exam: Vitals:   04/11/19 2100 04/11/19 2105 04/11/19 2200 04/11/19 2300  BP: (!) 162/97   (!) 156/92 (!) 160/102  Pulse: 96  (!) 101 96  Resp: (!) 31  (!) 37 (!) 33  Temp:      TempSrc:      SpO2: 100%  97% 98%  Weight:  82.2 kg    Height:  5\' 2"  (1.575 m)        Constitutional: Acutely ill looking in no acute distress Vitals:   04/11/19 2100 04/11/19 2105 04/11/19 2200 04/11/19 2300  BP: (!) 162/97  (!) 156/92 (!) 160/102  Pulse: 96  (!) 101 96  Resp: (!) 31  (!) 37 (!) 33  Temp:      TempSrc:      SpO2: 100%  97% 98%  Weight:  82.2 kg    Height:  5\' 2"  (1.575 m)     Eyes: PERRL, lids and conjunctivae normal ENMT: Mucous membranes are moist. Posterior pharynx clear of any exudate  or lesions.Normal dentition.  Neck: normal, supple, no masses, no thyromegaly Respiratory: Decreased air entry on the right with crackles and egophony, right-sided Port-A-Cath in place, no wheeze. No accessory muscle use.  Cardiovascular: Sinus tachycardia, no murmurs / rubs / gallops. No extremity edema. 2+ pedal pulses. No carotid bruits.  Abdomen: no tenderness, no masses palpated. No hepatosplenomegaly. Bowel sounds positive.  Musculoskeletal: no clubbing / cyanosis. No joint deformity upper and lower extremities. Good ROM, no contractures. Normal muscle tone.  Skin: no rashes, lesions, ulcers. No induration Neurologic: CN 2-12 grossly intact. Sensation intact, DTR normal. Strength 5/5 in all 4.  Psychiatric: Normal judgment and insight. Alert and oriented x 3. Normal mood.     Labs on Admission: I have personally reviewed following labs and imaging studies  CBC: Recent Labs  Lab 04/11/19 2028 04/11/19 2117  WBC 9.6  --   NEUTROABS 6.4  --   HGB 10.2* 10.9*  HCT 33.5* 32.0*  MCV 90.3  --   PLT 423*  --    Basic Metabolic Panel: Recent Labs  Lab 04/11/19 2028 04/11/19 2117  NA 142 140  K 3.3* 3.3*  CL 105 104  CO2 25  --   GLUCOSE 105* 106*  BUN 11 7  CREATININE 0.73 0.60  CALCIUM 9.2  --    GFR: Estimated Creatinine Clearance: 84.5 mL/min (by C-G formula  based on SCr of 0.6 mg/dL). Liver Function Tests: No results for input(s): AST, ALT, ALKPHOS, BILITOT, PROT, ALBUMIN in the last 168 hours. No results for input(s): LIPASE, AMYLASE in the last 168 hours. No results for input(s): AMMONIA in the last 168 hours. Coagulation Profile: No results for input(s): INR, PROTIME in the last 168 hours. Cardiac Enzymes: No results for input(s): CKTOTAL, CKMB, CKMBINDEX, TROPONINI in the last 168 hours. BNP (last 3 results) No results for input(s): PROBNP in the last 8760 hours. HbA1C: No results for input(s): HGBA1C in the last 72 hours. CBG: No results for input(s): GLUCAP in the last 168 hours. Lipid Profile: No results for input(s): CHOL, HDL, LDLCALC, TRIG, CHOLHDL, LDLDIRECT in the last 72 hours. Thyroid Function Tests: No results for input(s): TSH, T4TOTAL, FREET4, T3FREE, THYROIDAB in the last 72 hours. Anemia Panel: No results for input(s): VITAMINB12, FOLATE, FERRITIN, TIBC, IRON, RETICCTPCT in the last 72 hours. Urine analysis:    Component Value Date/Time   COLORURINE STRAW (A) 12/06/2018 1014   APPEARANCEUR CLEAR 12/06/2018 1014   LABSPEC 1.009 12/06/2018 1014   PHURINE 7.0 12/06/2018 1014   GLUCOSEU NEGATIVE 12/06/2018 1014   HGBUR SMALL (A) 12/06/2018 1014   BILIRUBINUR NEGATIVE 12/06/2018 1014   BILIRUBINUR negative 05/31/2018 1011   KETONESUR NEGATIVE 12/06/2018 1014   PROTEINUR NEGATIVE 12/06/2018 1014   UROBILINOGEN 0.2 05/31/2018 1011   UROBILINOGEN 0.2 05/18/2010 1042   NITRITE NEGATIVE 12/06/2018 1014   LEUKOCYTESUR NEGATIVE 12/06/2018 1014   Sepsis Labs: @LABRCNTIP (procalcitonin:4,lacticidven:4) ) Recent Results (from the past 240 hour(s))  SARS Coronavirus 2 (CEPHEID - Performed in [place] hospital lab), Hosp Order     Status: None   Collection Time: 04/11/19  8:28 PM   Specimen: Nasopharyngeal Swab  Result Value Ref Range Status   SARS Coronavirus 2 NEGATIVE NEGATIVE Final    Comment: (NOTE) If result is  NEGATIVE SARS-CoV-2 target nucleic acids are NOT DETECTED. The SARS-CoV-2 RNA is generally detectable in upper and lower  respiratory specimens during the acute phase of infection. The lowest  concentration of SARS-CoV-2 viral copies this assay can detect is  250  copies / mL. A negative result does not preclude SARS-CoV-2 infection  and should not be used as the sole basis for treatment or other  patient management decisions.  A negative result may occur with  improper specimen collection / handling, submission of specimen other  than nasopharyngeal swab, presence of viral mutation(s) within the  areas targeted by this assay, and inadequate number of viral copies  (<250 copies / mL). A negative result must be combined with clinical  observations, patient history, and epidemiological information. If result is POSITIVE SARS-CoV-2 target nucleic acids are DETECTED. The SARS-CoV-2 RNA is generally detectable in upper and lower  respiratory specimens dur ing the acute phase of infection.  Positive  results are indicative of active infection with SARS-CoV-2.  Clinical  correlation with patient history and other diagnostic information is  necessary to determine patient infection status.  Positive results do  not rule out bacterial infection or co-infection with other viruses. If result is PRESUMPTIVE POSTIVE SARS-CoV-2 nucleic acids MAY BE PRESENT.   A presumptive positive result was obtained on the submitted specimen  and confirmed on repeat testing.  While 2019 novel coronavirus  (SARS-CoV-2) nucleic acids may be present in the submitted sample  additional confirmatory testing may be necessary for epidemiological  and / or clinical management purposes  to differentiate between  SARS-CoV-2 and other Sarbecovirus currently known to infect humans.  If clinically indicated additional testing with an alternate test  methodology [phone]) is advised. The SARS-CoV-2 RNA is generally  detectable  in upper and lower respiratory sp ecimens during the acute  phase of infection. The expected result is Negative. Fact Sheet for Patients:  StrictlyIdeas.no Fact Sheet for Healthcare Providers: BankingDealers.co.za This test is not yet approved or cleared by the [place] FDA and has been authorized for detection and/or diagnosis of SARS-CoV-2 by FDA under an Emergency Use Authorization (EUA).  This EUA will remain in effect (meaning this test can be used) for the duration of the COVID-19 declaration under Section 564(b)(1) of the Act, 21 U.S.C. section 360bbb-3(b)(1), unless the authorization is terminated or revoked sooner. Performed at [facility], [place] [address], [place], [place] [address]      Radiological Exams on Admission: Ct Angio Chest Pe W And/or Wo Contrast  Result Date: 04/11/2019 CLINICAL DATA:  48 year old female with history of right breast cancer and recent mastectomy presenting with shortness of breath. EXAM: CT ANGIOGRAPHY CHEST WITH CONTRAST TECHNIQUE: Multidetector CT imaging of the chest was performed using the standard protocol during bolus administration of intravenous contrast. Multiplanar CT image reconstructions and MIPs were obtained to evaluate the vascular anatomy. CONTRAST:  168mL OMNIPAQUE IOHEXOL 350 MG/ML SOLN COMPARISON:  Chest CT dated 03/26/2019 and radiograph dated 04/09/2019 and CT dated 11/15/2018 FINDINGS: Cardiovascular: Borderline cardiomegaly. No pericardial effusion. The thoracic aorta is unremarkable. The origins of the great vessels of the aortic arch are patent as visualized. Evaluation of the pulmonary arteries is limited due to respiratory motion artifact and suboptimal visualization of the peripheral branches. No large or central pulmonary artery embolus identified. Mediastinum/Nodes: There is a 6.7 x 5.6 cm soft tissue appearing mass in the right cardiophrenic angle with  infiltration of the anterior chest wall and extension into the subpectoral region. This is new compared to the CT of 11/15/2018 and has increased in size since the study of 03/26/2019 where it measured approximately 4.0 x 3.7 cm by my measurements. This may represent a loculated malignant effusion versus a soft  tissue metastasis. Although, the lobulated border and infiltration into the anterior chest wall suggest metastasis, significant interval increase in size over the course of 2 weeks is somewhat unusual. This can be better evaluated with ultrasound or MRI. No definite mediastinal or hilar adenopathy identified. The esophagus is grossly unremarkable. Right pectoral Port-A-Cath with tip in the region of the right atrium noted. Lungs/Pleura: There is a moderate right pleural effusion with associated partial compressive atelectasis of the right lower lobe as well as areas of atelectatic changes in the right upper lobe. There has been interval increase in the size of the pleural effusion and associated atelectasis since the prior CT. Linear left lung base atelectasis/scarring as well as a small left pleural effusion noted. There is no pneumothorax. The central airways are patent. Upper Abdomen: Ill-defined partially exophytic 3.2 x 2.7 cm lesion from the posterior right lobe of the liver as seen previously. There is a 4 mm right renal upper pole calculus. Musculoskeletal: Surgical clips in the right axilla and right mastectomy. Interval resolution of the right axillary fluid/seroma. Biopsy clip in the left breast. No acute osseous pathology. Review of the MIP images confirms the above findings. IMPRESSION: 1. No CT evidence of central pulmonary artery embolus. 2. Interval increase in the size of the right pleural effusion with associated partial compressive atelectasis of the right lower lobe. 3. Interval increase in the size of the right cardiophrenic angle soft tissue mass or loculated collection with infiltration  of the anterior chest wall. This may represent a loculated malignant effusion versus a soft tissue metastasis. This can be better evaluated with ultrasound or MRI. 4. Ill-defined partially exophytic lesion from the posterior right lobe of the liver as seen on the prior CT. 5. Interval resolution of the right axillary fluid/seroma. Electronically Signed   By: Anner Crete M.D.   On: 04/11/2019 22:08    EKG: Independently reviewed.  It shows sinus tachycardia with a rate of 103, nonspecific ST changes.  Assessment/Plan Principal Problem:   Pleural effusion Active Problems:   GERD   Hyperlipidemia   Hypokalemia   Malignant neoplasm of lower-outer quadrant of right breast of female, estrogen receptor negative ([place])   Port-A-Cath in place   S/P mastectomy     #1 complex pleural effusion: Most likely malignant effusion.  Could be infectious.  Could be reactive.  Patient will need thoracentesis with analysis of the pleural fluid.  We will admit the patient to telemetry bed.  Get ultrasound-guided thoracentesis.  Based on the results patient may require chest tube or even thoracic surgery consultation depending on the outcome.  #2 stage IV breast cancer: Status post mastectomy.  Patient currently having immunotherapy.  She is scheduled to have follow-up with radiation oncology.  Completed chemotherapy.  Will inform oncology of patient's admission.  #3 GERD: Continue with PPIs  #4 hypokalemia: Continue with potassium supplementation.  #5 hyperlipidemia: Continue home regimen.   DVT prophylaxis: Heparin Code Status: Full code Family Communication: Care discussed with patient Disposition Plan: Home Consults called: None Admission status: Inpatient  Severity of Illness: The appropriate patient status for this patient is INPATIENT. Inpatient status is judged to be reasonable and necessary in order to provide the required intensity of service to ensure the patient's safety. The patient's  presenting symptoms, physical exam findings, and initial radiographic and laboratory data in the context of their chronic comorbidities is felt to place them at high risk for further clinical deterioration. Furthermore, it is not anticipated that the patient  will be medically stable for discharge from the hospital within 2 midnights of admission. The following factors support the patient status of inpatient.   " The patient's presenting symptoms include shortness of breath with tachycardia. " The worrisome physical exam findings include sinus tachycardia. " The initial radiographic and laboratory data are worrisome because of CT findings of right pleural effusion. " The chronic co-morbidities include right breast cancer.   * I certify that at the point of admission it is my clinical judgment that the patient will require inpatient hospital care spanning beyond 2 midnights from the point of admission due to high intensity of service, high risk for further deterioration and high frequency of surveillance required.Barbette Merino MD [facility] Pager [phone]  If 7PM-7AM, please contact night-coverage [email or web address] Password [facility]  04/12/2019, 12:05 AM

## 2019-04-12 NOTE — Progress Notes (Addendum)
1248--Returned via bed from [name] post thoracentesis. C/o severe pain to right chest, shoulder and upper abd. Chest expansion symmetrical, breath sound clear. CXR post procedure WNL, prn pain med given. VSS. Will monitor.  1256--Reports pain is decreasing significantly. Resp even and unlabored.

## 2019-04-12 NOTE — Progress Notes (Signed)
PROGRESS NOTE    Brittany Rodgers  Rodgers DOB: 18-Nov-1970 DOA: 04/11/2019 PCP: [name], MD   Brief Narrative:  Brittany Rodgers is Brittany Rodgers 48 y.o. female with medical history significant of right breast cancer status post chemotherapy and mastectomy about [facility] month ago also GERD that came in with shortness of breath and tachycardia.  Patient was tachypneic in the ER.  She was sent over for work-up for possible PE.  She has some chest pain but nothing out of the ordinary.  She has shortness of breath with exertion mainly.  No fever.  No nausea vomiting or diarrhea.  Patient was evaluated with CT angiogram of the chest showing no PE but patient has extensive pleural effusion on the right which looks complex possibly loculated.  Patient is being admitted for evaluation of her pleural effusion and sinus tachycardia..  ED Course: Temperature is 98.6 blood pressure 162/97 pulse 101 respiratory 95 oxygen sats 97% room air.  Sodium 140 potassium 3.3 chloride 105 CO2 25 glucose 105 BUN 11 creatinine 0.73.  White count 9.6 hemoglobin 10.1 platelets 423.  COVID-19 testing was negative.  CT angiogram of the chest showed no PE.  There was interval increase in size of right pleural effusion with associated partial compressive atelectasis of the right lower lobe.  Also right costal phrenic angle blunting.  Patient is therefore being admitted for work-up.   Assessment & Plan:   Principal Problem:   Pleural effusion Active Problems:   GERD   Hyperlipidemia   Hypokalemia   Malignant neoplasm of lower-outer quadrant of right breast of female, estrogen receptor negative (HCC)   [place] Klie[place]-Cath in place   S/P mastectomy   #1 Right Sided Effusion   Shortness of Breath:  Shortness of breath most likely 2/2 effusion.  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   S/p thoracentesis today No other infectious sx, will hold on abx for now Follow thoracentesis labs  # Soft Tissue Mass at R cardiophrenic angle: discussed  with rads who felt this was most c/w mass.  Discussed with oncology today who noted pt could follow up as outpatient with [name].    #2 stage IV breast cancer: Status post mastectomy.  Patient currently having immunotherapy.  She is scheduled to have follow-up with radiation oncology.  Completed chemotherapy.   She'll need to follow up outpatient with [name] if discharged prior to monday  #3 GERD: Continue with PPIs  #4 hypokalemia: Continue with potassium supplementation.  #5 hyperlipidemia: Continue home regimen.  DVT prophylaxis: heparin  Code Status: full  Family Communication: none at bedside Disposition Plan: pending further improvement   Consultants:   IR  Oncology over phone, [name]  Procedures:  US guided R thoracentesis  Antimicrobials:  Anti-infectives (From admission, onward)   Start     Dose/Rate Route Frequency Ordered Stop   04/11/19 2245  vancomycin (VANCOCIN) IVPB 1000 mg/200 mL premix     1,000 mg 200 mL/hr over 60 Minutes Intravenous  Once 04/11/19 2241 04/12/19 0003   04/11/19 2245  piperacillin-tazobactam (ZOSYN) IVPB 3.375 g     3.375 g 100 mL/hr over 30 Minutes Intravenous  Once 04/11/19 2241 04/11/19 2354     Subjective: Notes SOB led to presentation Progressive   Objective: Vitals:   04/12/19 0507 04/12/19 1139 04/12/19 1211 04/12/19 1246  BP: (!) 153/91 (!) 147/88 128/87 (!) 152/100  Pulse: 91   97  Resp: (!) 24   (!) 24  Temp: 98.4 F (36.9 C)  TempSrc:      SpO2: 97%   99%  Weight:      Height:        Intake/Output Summary (Last 24 hours) at 04/12/2019 1426 Last data filed at 04/12/2019 0013 Gross per 24 hour  Intake 601 ml  Output --  Net 601 ml   Filed Weights   04/11/19 2105  Weight: 82.2 kg    Examination:  General exam: Appears calm and comfortable  Respiratory system: tachypneic with increased WOB Cardiovascular system: S1 & S2 heard, RRR. Gastrointestinal system: Abdomen is nondistended, soft and  nontender. Central nervous system: Alert and oriented. No focal neurological deficits. Extremities: no lee Skin: No rashes, lesions or ulcers Psychiatry: Judgement and insight appear normal. Mood & affect appropriate.     Data Reviewed: I have personally reviewed following labs and imaging studies  CBC: Recent Labs  Lab 04/11/19 2028 04/11/19 2117 04/12/19 0356  WBC 9.6  --  8.8  NEUTROABS 6.4  --   --   HGB 10.2* 10.9* 9.8*  HCT 33.5* 32.0* 32.9*  MCV 90.3  --  89.6  PLT 423*  --  517   Basic Metabolic Panel: Recent Labs  Lab 04/11/19 2028 04/11/19 2117 04/12/19 0356  NA 142 140 139  K 3.3* 3.3* 3.4*  CL 105 104 103  CO2 25  --  24  GLUCOSE 105* 106* 110*  BUN 11 7 8   CREATININE 0.73 0.60 0.68  CALCIUM 9.2  --  8.9   GFR: Estimated Creatinine Clearance: 84.5 mL/min (by C-G formula based on SCr of 0.68 mg/dL). Liver Function Tests: Recent Labs  Lab 04/12/19 0356  AST 11*  ALT 30  ALKPHOS 93  BILITOT 0.4  PROT 6.8  ALBUMIN 3.3*   No results for input(s): LIPASE, AMYLASE in the last 168 hours. No results for input(s): AMMONIA in the last 168 hours. Coagulation Profile: No results for input(s): INR, PROTIME in the last 168 hours. Cardiac Enzymes: No results for input(s): CKTOTAL, CKMB, CKMBINDEX, TROPONINI in the last 168 hours. BNP (last 3 results) No results for input(s): PROBNP in the last 8760 hours. HbA1C: No results for input(s): HGBA1C in the last 72 hours. CBG: No results for input(s): GLUCAP in the last 168 hours. Lipid Profile: No results for input(s): CHOL, HDL, LDLCALC, TRIG, CHOLHDL, LDLDIRECT in the last 72 hours. Thyroid Function Tests: No results for input(s): TSH, T4TOTAL, FREET4, T3FREE, THYROIDAB in the last 72 hours. Anemia Panel: No results for input(s): VITAMINB12, FOLATE, FERRITIN, TIBC, IRON, RETICCTPCT in the last 72 hours. Sepsis Labs: Recent Labs  Lab 04/11/19 2028  LATICACIDVEN 1.4    Recent Results (from the past  240 hour(s))  SARS Coronavirus 2 (CEPHEID - Performed in [facility] hospital lab), Hosp Order     Status: None   Collection Time: 04/11/19  8:28 PM   Specimen: Nasopharyngeal Swab  Result Value Ref Range Status   SARS Coronavirus 2 NEGATIVE NEGATIVE Final    Comment: (NOTE) If result is NEGATIVE SARS-CoV-2 target nucleic acids are NOT DETECTED. The SARS-CoV-2 RNA is generally detectable in upper and lower  respiratory specimens during the acute phase of infection. The lowest  concentration of SARS-CoV-2 viral copies this assay can detect is 250  copies / mL. Brittany Rodgers negative result does not preclude SARS-CoV-2 infection  and should not be used as the sole basis for treatment or other  patient management decisions.  Lakenzie Mcclafferty negative result may occur with  improper specimen collection / handling, submission  of specimen other  than nasopharyngeal swab, presence of viral mutation(s) within the  areas targeted by this assay, and inadequate number of viral copies  (<250 copies / mL). Brittany Rodgers negative result must be combined with clinical  observations, patient history, and epidemiological information. If result is POSITIVE SARS-CoV-2 target nucleic acids are DETECTED. The SARS-CoV-2 RNA is generally detectable in upper and lower  respiratory specimens dur ing the acute phase of infection.  Positive  results are indicative of active infection with SARS-CoV-2.  Clinical  correlation with patient history and other diagnostic information is  necessary to determine patient infection status.  Positive results do  not rule out bacterial infection or co-infection with other viruses. If result is PRESUMPTIVE POSTIVE SARS-CoV-2 nucleic acids MAY BE PRESENT.   Emanii Bugbee presumptive positive result was obtained on the submitted specimen  and confirmed on repeat testing.  While 2019 novel coronavirus  (SARS-CoV-2) nucleic acids may be present in the submitted sample  additional confirmatory testing may be necessary for  epidemiological  and / or clinical management purposes  to differentiate between  SARS-CoV-2 and other Sarbecovirus currently known to infect humans.  If clinically indicated additional testing with an alternate test  methodology [phone]) is advised. The SARS-CoV-2 RNA is generally  detectable in upper and lower respiratory sp ecimens during the acute  phase of infection. The expected result is Negative. Fact Sheet for Patients:  StrictlyIdeas.no Fact Sheet for Healthcare Providers: BankingDealers.co.za This test is not yet approved or cleared by the [place] FDA and has been authorized for detection and/or diagnosis of SARS-CoV-2 by FDA under an Emergency Use Authorization (EUA).  This EUA will remain in effect (meaning this test can be used) for the duration of the COVID-19 declaration under Section 564(b)(1) of the Act, 21 U.S.C. section 360bbb-3(b)(1), unless the authorization is terminated or revoked sooner. Performed at [facility], [place] [address] [place], [place] [address]          Radiology Studies: Ct Angio Chest Pe W And/or Wo Contrast  Result Date: 04/11/2019 CLINICAL DATA:  48 year old female with history of right breast cancer and recent mastectomy presenting with shortness of breath. EXAM: CT ANGIOGRAPHY CHEST WITH CONTRAST TECHNIQUE: Multidetector CT imaging of the chest was performed using the standard protocol during bolus administration of intravenous contrast. Multiplanar CT image reconstructions and MIPs were obtained to evaluate the vascular anatomy. CONTRAST:  135mL OMNIPAQUE IOHEXOL 350 MG/ML SOLN COMPARISON:  Chest CT dated 03/26/2019 and radiograph dated 04/09/2019 and CT dated 11/15/2018 FINDINGS: Cardiovascular: Borderline cardiomegaly. No pericardial effusion. The thoracic aorta is unremarkable. The origins of the great vessels of the aortic arch are patent as visualized. Evaluation of  the pulmonary arteries is limited due to respiratory motion artifact and suboptimal visualization of the peripheral branches. No large or central pulmonary artery embolus identified. Mediastinum/Nodes: There is Brittany Rodgers 6.7 x 5.6 cm soft tissue appearing mass in the right cardiophrenic angle with infiltration of the anterior chest wall and extension into the subpectoral region. This is new compared to the CT of 11/15/2018 and has increased in size since the study of 03/26/2019 where it measured approximately 4.0 x 3.7 cm by my measurements. This may represent Brittany Rodgers loculated malignant effusion versus Brittany Rodgers soft tissue metastasis. Although, the lobulated border and infiltration into the anterior chest wall suggest metastasis, significant interval increase in size over the course of 2 weeks is somewhat unusual. This can be better evaluated with ultrasound or MRI. No definite mediastinal or hilar  adenopathy identified. The esophagus is grossly unremarkable. Right pectoral [place] Iglesia-Cath with tip in the region of the right atrium noted. Lungs/Pleura: There is Brittany Rodgers moderate right pleural effusion with associated partial compressive atelectasis of the right lower lobe as well as areas of atelectatic changes in the right upper lobe. There has been interval increase in the size of the pleural effusion and associated atelectasis since the prior CT. Linear left lung base atelectasis/scarring as well as Brittany Rodgers small left pleural effusion noted. There is no pneumothorax. The central airways are patent. Upper Abdomen: Ill-defined partially exophytic 3.2 x 2.7 cm lesion from the posterior right lobe of the liver as seen previously. There is Brittany Rodgers 4 mm right renal upper pole calculus. Musculoskeletal: Surgical clips in the right axilla and right mastectomy. Interval resolution of the right axillary fluid/seroma. Biopsy clip in the left breast. No acute osseous pathology. Review of the MIP images confirms the above findings. IMPRESSION: 1. No CT evidence of  central pulmonary artery embolus. 2. Interval increase in the size of the right pleural effusion with associated partial compressive atelectasis of the right lower lobe. 3. Interval increase in the size of the right cardiophrenic angle soft tissue mass or loculated collection with infiltration of the anterior chest wall. This may represent Brittany Rodgers loculated malignant effusion versus Brittany Rodgers soft tissue metastasis. This can be better evaluated with ultrasound or MRI. 4. Ill-defined partially exophytic lesion from the posterior right lobe of the liver as seen on the prior CT. 5. Interval resolution of the right axillary fluid/seroma. Electronically Signed   By: Anner Crete M.D.   On: 04/11/2019 22:08   Dg Chest Port 1 View  Result Date: 04/12/2019 CLINICAL DATA:  Post thoracentesis EXAM: PORTABLE CHEST 1 VIEW COMPARISON:  04/09/2019 FINDINGS: Slight interval reduction in volume of Brittany Rodgers right-sided pleural effusion. No significant pneumothorax. Otherwise unchanged examination with cardiomegaly and bandlike scarring or atelectasis of the left lung base. IMPRESSION: Slight interval reduction in volume of Brittany Rodgers right-sided pleural effusion. No significant pneumothorax. Otherwise unchanged examination with cardiomegaly and bandlike scarring or atelectasis of the left lung base. Electronically Signed   By: Eddie Candle M.D.   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 She presents for thoracentesis. EXAM: ULTRASOUND GUIDED RIGHT THORACENTESIS MEDICATIONS: None. COMPLICATIONS: None immediate. PROCEDURE: An ultrasound guided thoracentesis was thoroughly discussed with the patient and questions answered. The benefits, risks, alternatives and complications were also discussed. The patient understands and wishes to proceed with the procedure. Written consent was  obtained. Ultrasound was performed to localize and mark an adequate pocket of fluid in the right chest. The area was then prepped and draped in the normal sterile fashion. 1% Lidocaine was used for local anesthesia. Under ultrasound guidance Briggette Najarian 6 Fr Safe-T-Centesis catheter was introduced. Thoracentesis was performed. The catheter was removed and Saira Kramme dressing applied. FINDINGS: Ayana Imhof total of approximately 500 mL of bloody pleural fluid was removed. Samples were sent to the laboratory as requested by the clinical team. IMPRESSION: Successful ultrasound guided right thoracentesis yielding 500 mL of pleural fluid. Electronically Signed   By: Jacqulynn Cadet M.D.   On: 04/12/2019 13:09        Scheduled Meds:  atorvastatin  20 mg Oral Daily   cyclobenzaprine  10 mg Oral TID   gabapentin  300 mg Oral QHS   gabapentin  600 mg Oral BID  heparin  5,000 Units Subcutaneous Q8H   lidocaine       metoprolol succinate  50 mg Oral Daily   pantoprazole  40 mg Oral Daily   potassium chloride  10 mEq Oral Daily   sodium chloride flush  10-40 mL Intracatheter Q12H   Continuous Infusions:  sodium chloride 100 mL/hr at 04/12/19 0109     LOS: 1 day    Time spent: over 59 min    Fayrene Helper, MD [facility] Pager AMION  If 7PM-7AM, please contact night-coverage [email or web address] Password [email or web address] 04/12/2019, 2:26 PM

## 2019-04-13 ENCOUNTER — Inpatient Hospital Stay (HOSPITAL_COMMUNITY): Payer: BC Managed Care – PPO

## 2019-04-13 DIAGNOSIS — J90 Pleural effusion, not elsewhere classified: Secondary | ICD-10-CM

## 2019-04-13 LAB — CBC
HCT: 34.2 % — ABNORMAL LOW (ref 36.0–46.0)
Hemoglobin: 10.5 g/dL — ABNORMAL LOW (ref 12.0–15.0)
MCH: 27.3 pg (ref 26.0–34.0)
MCHC: 30.7 g/dL (ref 30.0–36.0)
MCV: 88.8 fL (ref 80.0–100.0)
Platelets: 377 10*3/uL (ref 150–400)
RBC: 3.85 MIL/uL — ABNORMAL LOW (ref 3.87–5.11)
RDW: 18.5 % — ABNORMAL HIGH (ref 11.5–15.5)
WBC: 9.1 10*3/uL (ref 4.0–10.5)
nRBC: 0 % (ref 0.0–0.2)

## 2019-04-13 LAB — COMPREHENSIVE METABOLIC PANEL
ALT: 27 U/L (ref 0–44)
AST: 10 U/L — ABNORMAL LOW (ref 15–41)
Albumin: 3.6 g/dL (ref 3.5–5.0)
Alkaline Phosphatase: 100 U/L (ref 38–126)
Anion gap: 11 (ref 5–15)
BUN: 18 mg/dL (ref 6–20)
CO2: 25 mmol/L (ref 22–32)
Calcium: 9.4 mg/dL (ref 8.9–10.3)
Chloride: 99 mmol/L (ref 98–111)
Creatinine, Ser: 0.93 mg/dL (ref 0.44–1.00)
GFR calc Af Amer: >60 mL/min (ref >60)
GFR calc non Af Amer: >60 mL/min (ref >60)
Glucose, Bld: 115 mg/dL — ABNORMAL HIGH (ref 70–99)
Potassium: 3.6 mmol/L (ref 3.5–5.1)
Sodium: 135 mmol/L (ref 135–145)
Total Bilirubin: 0.6 mg/dL (ref 0.3–1.2)
Total Protein: 7.4 g/dL (ref 6.5–8.1)

## 2019-04-13 LAB — GRAM STAIN

## 2019-04-13 LAB — PROTIME-INR
INR: 1.1 (ref 0.8–1.2)
Prothrombin Time: 13.7 seconds (ref 11.4–15.2)

## 2019-04-13 LAB — MAGNESIUM: Magnesium: 2 mg/dL (ref 1.7–2.4)

## 2019-04-13 LAB — APTT: aPTT: 36 seconds (ref 24–36)

## 2019-04-13 MED ORDER — FUROSEMIDE 10 MG/ML IJ SOLN
40.0000 mg | Freq: Once | INTRAMUSCULAR | Status: AC
  Administered 2019-04-13: 40 mg via INTRAVENOUS
  Filled 2019-04-13: qty 4

## 2019-04-13 MED ORDER — LACTATED RINGERS IV SOLN
INTRAVENOUS | Status: DC
  Administered 2019-04-13: 17:00:00 via INTRAVENOUS

## 2019-04-13 MED ORDER — GUAIFENESIN-DM 100-10 MG/5ML PO SYRP
5.0000 mL | ORAL_SOLUTION | ORAL | Status: DC | PRN
  Administered 2019-04-13 – 2019-04-17 (×10): 5 mL via ORAL
  Filled 2019-04-13 (×10): qty 10

## 2019-04-13 MED ORDER — IPRATROPIUM-ALBUTEROL 0.5-2.5 (3) MG/3ML IN SOLN
3.0000 mL | Freq: Four times a day (QID) | RESPIRATORY_TRACT | Status: DC
  Administered 2019-04-13 (×2): 3 mL via RESPIRATORY_TRACT
  Filled 2019-04-13 (×2): qty 3

## 2019-04-13 MED ORDER — OXYCODONE HCL 5 MG PO TABS
10.0000 mg | ORAL_TABLET | ORAL | Status: DC | PRN
  Administered 2019-04-15 – 2019-04-18 (×9): 10 mg via ORAL
  Filled 2019-04-13 (×9): qty 2

## 2019-04-13 MED ORDER — METRONIDAZOLE 500 MG PO TABS
500.0000 mg | ORAL_TABLET | Freq: Three times a day (TID) | ORAL | Status: DC
  Administered 2019-04-13 – 2019-04-14 (×2): 500 mg via ORAL
  Filled 2019-04-13 (×2): qty 1

## 2019-04-13 MED ORDER — OXYCODONE HCL 5 MG PO TABS
5.0000 mg | ORAL_TABLET | ORAL | Status: DC | PRN
  Administered 2019-04-14 – 2019-04-16 (×3): 5 mg via ORAL
  Filled 2019-04-13 (×3): qty 1

## 2019-04-13 MED ORDER — SODIUM CHLORIDE 0.9 % IV SOLN
2.0000 g | Freq: Three times a day (TID) | INTRAVENOUS | Status: DC
  Administered 2019-04-13 – 2019-04-18 (×14): 2 g via INTRAVENOUS
  Filled 2019-04-13 (×17): qty 2

## 2019-04-13 MED ORDER — VANCOMYCIN HCL IN DEXTROSE 1-5 GM/200ML-% IV SOLN
1000.0000 mg | INTRAVENOUS | Status: DC
  Administered 2019-04-15: 1000 mg via INTRAVENOUS
  Filled 2019-04-13: qty 200

## 2019-04-13 MED ORDER — ACETAMINOPHEN 325 MG PO TABS
650.0000 mg | ORAL_TABLET | Freq: Four times a day (QID) | ORAL | Status: DC | PRN
  Administered 2019-04-13: 650 mg via ORAL
  Filled 2019-04-13: qty 2

## 2019-04-13 MED ORDER — FUROSEMIDE 10 MG/ML IJ SOLN
20.0000 mg | Freq: Once | INTRAMUSCULAR | Status: AC
  Administered 2019-04-13: 20 mg via INTRAVENOUS
  Filled 2019-04-13: qty 2

## 2019-04-13 MED ORDER — IPRATROPIUM-ALBUTEROL 0.5-2.5 (3) MG/3ML IN SOLN
3.0000 mL | Freq: Three times a day (TID) | RESPIRATORY_TRACT | Status: DC
  Administered 2019-04-14 – 2019-04-16 (×9): 3 mL via RESPIRATORY_TRACT
  Filled 2019-04-13 (×9): qty 3

## 2019-04-13 MED ORDER — HYDROMORPHONE HCL 1 MG/ML IJ SOLN
0.5000 mg | INTRAMUSCULAR | Status: DC | PRN
  Administered 2019-04-13 – 2019-04-17 (×7): 0.5 mg via INTRAVENOUS
  Filled 2019-04-13 (×4): qty 0.5
  Filled 2019-04-13: qty 1
  Filled 2019-04-13 (×2): qty 0.5

## 2019-04-13 MED ORDER — VANCOMYCIN HCL 10 G IV SOLR
1750.0000 mg | Freq: Once | INTRAVENOUS | Status: DC
  Filled 2019-04-13: qty 1750

## 2019-04-13 MED ORDER — VANCOMYCIN HCL 10 G IV SOLR
1500.0000 mg | Freq: Once | INTRAVENOUS | Status: AC
  Administered 2019-04-13: 1500 mg via INTRAVENOUS
  Filled 2019-04-13: qty 1500

## 2019-04-13 MED ORDER — ALBUTEROL SULFATE (2.5 MG/3ML) 0.083% IN NEBU
2.5000 mg | INHALATION_SOLUTION | RESPIRATORY_TRACT | Status: DC | PRN
  Administered 2019-04-15: 2.5 mg via RESPIRATORY_TRACT
  Filled 2019-04-13: qty 3

## 2019-04-13 NOTE — Plan of Care (Signed)
  Problem: Education: Goal: Knowledge of General Education information will improve Description: Including pain rating scale, medication(s)/side effects and non-pharmacologic comfort measures Outcome: Progressing   Problem: Clinical Measurements: Goal: Cardiovascular complication will be avoided Outcome: Progressing   Problem: Nutrition: Goal: Adequate nutrition will be maintained Outcome: Progressing   Problem: Pain Managment: Goal: General experience of comfort will improve Outcome: Progressing   

## 2019-04-13 NOTE — Evaluation (Addendum)
Physical Therapy Evaluation Patient Details Name: Brittany Rodgers MRN: [id number] DOB: [birth year] Today's Date: 04/13/2019   History of Present Illness  48 yo female admitted with pleural effusion. Hx of breat cancer s/p chemo, mastectomy  Clinical Impression  On eval, pt required Mod assist for bed mobility and Min assist +2 for safety for ambulation. Mobility is significantly limited by pain and dyspnea. Pt presents with general weakness, decreased activity tolerance, and impaired gait and balance. Discussed d/c plan-pt stated she has assistance at home but its not available 24/7. May need ST rehab at SNF to improve functional independence and decrease burden of care on family. If SNF is not an option/pt declines placement, she will need a home health aide and HHPT f/u.     Follow Up Recommendations SNF;Supervision/Assistance - 24 hour    Equipment Recommendations  Rolling Walker   Recommendations for Other Services       Precautions / Restrictions Precautions Precautions: Fall Restrictions Weight Bearing Restrictions: No      Mobility  Bed Mobility Overal bed mobility: Needs Assistance Bed Mobility: Supine to Sit;Sit to Supine     Supine to sit: Mod assist;HOB elevated Sit to supine: Mod assist;HOB elevated   General bed mobility comments: Assist for trunk and bil LEs. Increased time. Severe pain with mobility. Rest breaks needed during task  Transfers Overall transfer level: Needs assistance Equipment used: Rolling walker (2 wheeled) Transfers: Sit to/from [facility] Sit to Stand: Min assist Stand pivot transfers: Min assist       General transfer comment: Assist to rise, stabilize, control descent. VCs safety, hand placement. Stand pivot x 2, bed<>bsc. Increased time.  Ambulation/Gait Ambulation/Gait assistance: Min assist;+2 safety/equipment Gait Distance (Feet): 3 Feet         General Gait Details: Pt took a few steps in room with use  of RW. Limited by pain, dyspnea  Stairs            Wheelchair Mobility    Modified Rankin (Stroke Patients Only)       Balance Overall balance assessment: Needs assistance         Standing balance support: Bilateral upper extremity supported Standing balance-Leahy Scale: Poor                               Pertinent Vitals/Pain Pain Assessment: 0-10 Pain Score: 10-Worst pain ever Pain Location: R flank, midline of back/spine Pain Descriptors / Indicators: Sharp;Guarding;Grimacing;Discomfort;Moaning Pain Intervention(s): Limited activity within patient's tolerance;Repositioned;Patient requesting pain meds-RN notified    Home Living Family/patient expects to be discharged to:: Private residence Living Arrangements: Spouse/significant other Available Help at Discharge: Family;Available PRN/intermittently Type of Home: House Home Access: Stairs to enter Entrance Stairs-Rails: Right Entrance Stairs-Number of Steps: 3   Home Equipment: Shower seat      Prior Function Level of Independence: Independent               Hand Dominance        Extremity/Trunk Assessment   Upper Extremity Assessment Upper Extremity Assessment: RUE deficits/detail RUE Deficits / Details: restricted R UE    Lower Extremity Assessment Lower Extremity Assessment: Generalized weakness    Cervical / Trunk Assessment Cervical / Trunk Assessment: Normal  Communication   Communication: No difficulties  Cognition Arousal/Alertness: Awake/alert Behavior During Therapy: WFL for tasks assessed/performed Overall Cognitive Status: Within Functional Limits for tasks assessed  General Comments      Exercises     Assessment/Plan    PT Assessment Patient needs continued PT services  PT Problem List Decreased strength;Decreased mobility;Decreased activity tolerance;Decreased balance;Decreased knowledge of use of  DME;Pain       PT Treatment Interventions DME instruction;Gait training;Therapeutic exercise;Therapeutic activities;Patient/family education;Balance training;Functional mobility training    PT Goals (Current goals can be found in the Care Plan section)  Acute Rehab PT Goals Patient Stated Goal: less pain PT Goal Formulation: With patient Time For Goal Achievement: 04/27/19 Potential to Achieve Goals: Good    Frequency Min 3X/week   Barriers to discharge        Co-evaluation               AM-PAC PT "6 Clicks" Mobility  Outcome Measure Help needed turning from your back to your side while in a flat bed without using bedrails?: A Lot Help needed moving from lying on your back to sitting on the side of a flat bed without using bedrails?: A Lot Help needed moving to and from a bed to a chair (including a wheelchair)?: A Little Help needed standing up from a chair using your arms (e.g., wheelchair or bedside chair)?: A Little Help needed to walk in hospital room?: A Lot Help needed climbing 3-5 steps with a railing? : A Lot 6 Click Score: 14    End of Session   Activity Tolerance: Patient limited by pain;Patient limited by fatigue Patient left: in bed;with call bell/phone within reach;with bed alarm set   PT Visit Diagnosis: Pain;Muscle weakness (generalized) (M62.81);Difficulty in walking, not elsewhere classified (R26.2) Pain - part of body: (R flank, back)    Time: 1430-1456 PT Time Calculation (min) (ACUTE ONLY): 26 min   Charges:   PT Evaluation $PT Eval Moderate Complexity: 1 Mod PT Treatments $Therapeutic Activity: 8-22 mins          Weston Anna, PT Acute Rehabilitation Services Pager: [phone] Office: [phone]

## 2019-04-13 NOTE — Consult Note (Addendum)
NAME:  Brittany Rodgers, MRN:  [id number], DOB:  [birth year], LOS: 2 ADMISSION DATE:  04/11/2019, CONSULTATION DATE:  7/19  CHIEF COMPLAINT:  Effusion  Brief History   Patient with history of breast cancer (surgery this past June and currently getting chemo) consulted for evolving malignant effusion  History of present illness   Brittany Rodgers is a 48 year old with history of right breast cancer sp mastectomy and chemotherapy who present with SOB and tachycardia. Was admitted for further wokrup and management of this. In ed [patient noted to have no PE. But extensive pleural effusion on the right. Vitals were stable.CBC and BMP reassuring.  Patient had thoracentesis while here. Very painful with continued pain into this evening. Had 500 cc's removed with IR. Described as blood pointing to likely maligant process. LDH 1777 which is ocnsistent with inflammatory component. Cx's negative so far but patient did have fever this evening. Also noted ot have WBC of 6854 with predominately neutrophils.  Gram stain negative and cx negative as well.    Past Medical History  Breast cancer GERD HLD IBS  Significant Hospital Events   Admitted on 7/18 Thora with IR on 7/18 500 cc's of sanguinous fluid  Consults:  Pulm and IR  Procedures:  Thora as noted above  Significant Diagnostic Tests:  BF notable for LDH of 1777  Micro Data:  No growth in BF to date  Antimicrobials:  NA  Interim history/subjective:  Continued WOB noted  Objective   Blood pressure (!) 140/95, pulse (!) 110, temperature 98.1 F (36.7 C), temperature source Oral, resp. rate (!) 31, height 5\' 2"  (1.575 m), weight 82.2 kg, SpO2 97 %.        Intake/Output Summary (Last 24 hours) at 04/13/2019 2105 Last data filed at 04/13/2019 2017 Gross per 24 hour  Intake 2073.93 ml  Output 1700 ml  Net 373.93 ml   Filed Weights   04/11/19 2105  Weight: 82.2 kg    Examination: General: Patient in no acute distress. HENT:  Moist mucous membranes Lungs: Diminioshed bilaterally. Tachpneic into mid to high 20's. Able to speak in full sentences however appears winded with this. At rest appears comfortable. Cardiovascular: 1+ pitting edema Abdomen: Mild distension noted. No tenderness Extremities: No deformities noted Neuro: No focal deficits moving all extremities spontaneously GU: Defered  Resolved Hospital Problem list   Na  Assessment & Plan:  This is a 48 year who presented with respiratory distress. Noted to have effusion. Drained by IR. Continues to have increased WOB. 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 Infectious on differnetial as well. However theres is some element of volume overload as well. CT with GGO and interlobular septal thickening. Physical with pitting edema and JVP. Cultures negative on thora so far however with recent fever would consider starting abx for possible parapneumonic effusion. There is a predominant of whites (malignany vs infectious) and negative gram stain. PH pending. No glucose noted. -Patient has been told she will need repeat thora with removal of more fluid to see if this helps with symptoms. Prevoious thora very painful and minimal relief of symptoms. If she gets relief from next thora a pleurx would likely be of benefit for this patient -For now patient needs more aggressive diuresis and can try on CPAP tonight to hlep with any elemaent of overload. -Aware that overload is not primary process here however will likely be helpful -Patient has refused thora tonight as there was so much pain with  previous thora. (Likely from inflamed pleura could be malignant vs infectious) Have told her she will need it before she can consider leaving. She will reassess in the AM -If patient pesists with fever or seems like becoming septic may need chest tube rather than repeat thora.  -Hold morning prophylactic AC for likely future thora vs chest tube pending  clinical status. -Consider abx with anaerobe coverage like zosyn for now   Breast cancer-Being Treated with chemo. Stage 4. She is being treted with immunotherapy currently. Unlikely to be check point inhibitor pneumonitis as much of the GGO and opacities were present before intiation of keytruda on 7/06. CT 7/01 with much of the same abnormalities as now -Continue to monitor for pneumonitis. Unlikely culprit here.         Labs   CBC: Recent Labs  Lab 04/11/19 2028 04/11/19 2117 04/12/19 0356 04/13/19 0511  WBC 9.6  --  8.8 9.1  NEUTROABS 6.4  --   --   --   HGB 10.2* 10.9* 9.8* 10.5*  HCT 33.5* 32.0* 32.9* 34.2*  MCV 90.3  --  89.6 88.8  PLT 423*  --  399 185    Basic Metabolic Panel: Recent Labs  Lab 04/11/19 2028 04/11/19 2117 04/12/19 0356 04/13/19 0511  NA 142 140 139 135  K 3.3* 3.3* 3.4* 3.6  CL 105 104 103 99  CO2 25  --  24 25  GLUCOSE 105* 106* 110* 115*  BUN 11 7 8 18   CREATININE 0.73 0.60 0.68 0.93  CALCIUM 9.2  --  8.9 9.4  MG  --   --   --  2.0   GFR: Estimated Creatinine Clearance: 72.7 mL/min (by C-G formula based on SCr of 0.93 mg/dL). Recent Labs  Lab 04/11/19 2028 04/12/19 0356 04/13/19 0511  WBC 9.6 8.8 9.1  LATICACIDVEN 1.4  --   --     Liver Function Tests: Recent Labs  Lab 04/12/19 0356 04/13/19 0511  AST 11* 10*  ALT 30 27  ALKPHOS 93 100  BILITOT 0.4 0.6  PROT 6.8 7.4  ALBUMIN 3.3* 3.6   No results for input(s): LIPASE, AMYLASE in the last 168 hours. No results for input(s): AMMONIA in the last 168 hours.  ABG    Component Value Date/Time   TCO2 27 04/11/2019 2117     Coagulation Profile: Recent Labs  Lab 04/13/19 2016  INR 1.1    Cardiac Enzymes: No results for input(s): CKTOTAL, CKMB, CKMBINDEX, TROPONINI in the last 168 hours.  HbA1C: Hgb A1c MFr Bld  Date/Time Value Ref Range Status  05/17/2010 09:50 PM  <5.7 % Final   5.4 (NOTE)                                                                        According to the ADA Clinical Practice Recommendations for 2011, when HbA1c is used as a screening test:   >=6.5%   Diagnostic of Diabetes Mellitus           (if abnormal result  is confirmed)  5.7-6.4%   Increased risk of developing Diabetes Mellitus  References:Diagnosis and Classification of Diabetes Mellitus,Diabetes UDJS,9702,63(ZCHYI 1):S62-S69 and Standards of Medical Care in  Diabetes - 2011,Diabetes Care,2011,34  (Suppl 1):S11-S61.    CBG: No results for input(s): GLUCAP in the last 168 hours.  Review of Systems:   Pertinent positive and negative per HPI  Past Medical History  She,  has a past medical history of Cancer ([place]), Chronic cough, Family history of breast cancer, GERD (gastroesophageal reflux disease), Hyperlipidemia, IBS (irritable bowel syndrome), and Routine gynecological examination.   Surgical History    Past Surgical History:  Procedure Laterality Date  . CESAREAN SECTION    . MASTECTOMY W/ SENTINEL NODE BIOPSY Right 03/04/2019   Procedure: RIGHT MASTECTOMY WITH RIGHT AXILLARY SENTINEL LYMPH NODE BIOPSY;  Surgeon: Rolm Bookbinder, MD;  Location: [facility];  Service: General;  Laterality: Right;  . PORTACATH PLACEMENT Right 11/14/2018   Procedure: INSERTION PORT-A-CATH WITH ULTRASOUND;  Surgeon: Rolm Bookbinder, MD;  Location: [facility];  Service: General;  Laterality: Right;  . ROOT CANAL       Social History   reports that she has never smoked. She has never used smokeless tobacco. She reports that she does not drink alcohol or use drugs.   Family History   Her family history includes Breast cancer in her maternal aunt, maternal aunt, maternal aunt and another family member; Hypertension in her mother and another family member.   Allergies Allergies  Allergen Reactions  . Barbiturates Other (See Comments)    Keeps pt awake  . Methocarbamol Itching  . Sulfonamide Derivatives Itching     Home Medications   Prior to Admission medications   Medication Sig Start Date End Date Taking? Authorizing Provider  atorvastatin (LIPITOR) 20 MG tablet Take 1 tablet (20 mg total) by mouth daily. 05/31/18  Yes [name], MD  cyclobenzaprine (FLEXERIL) 10 MG tablet Take 1 tablet (10 mg total) by mouth 3 (three) times daily. 03/06/19  Yes Rolm Bookbinder, MD  gabapentin (NEURONTIN) 300 MG capsule Take 1 capsule (300 mg total) by mouth at bedtime. 02/26/19  Yes Magrinat, [name], MD  gabapentin (NEURONTIN) 300 MG capsule Take 1 capsule (300 mg total) by mouth 2 (two) times daily. Patient taking differently: Take 600 mg by mouth 2 (two) times daily.  03/06/19  Yes Rolm Bookbinder, MD  lidocaine-prilocaine (EMLA) cream Apply to affected area once Patient taking differently: Apply 1 application topically daily as needed (numbing).  11/15/18  Yes Magrinat, [name], MD  metoprolol succinate (TOPROL-XL) 50 MG 24 hr tablet Take 1 tablet (50 mg total) by mouth daily. Take with or immediately following a meal. 04/09/19  Yes [name], MD  Omeprazole-Sodium Bicarbonate (ZEGERID) 20-1100 MG CAPS capsule Take 1 capsule by mouth daily before breakfast. 05/31/18  Yes [name], MD  oxyCODONE (OXY IR/ROXICODONE) 5 MG immediate release tablet Take 1 tablet (5 mg total) by mouth every 4 (four) hours as needed for moderate pain. 04/02/19  Yes Magrinat, [name], MD  polyethylene glycol (MIRALAX / GLYCOLAX) packet Take 17 g by mouth as needed. Patient taking differently: Take 17 g by mouth daily as needed (constipation). Mix in 8 oz liquid and drink 04/23/12  Yes [name], MD  potassium chloride (K-DUR) 10 MEQ tablet TAKE 1 TABLET BY MOUTH ONCE DAILY Patient taking differently: Take 10 mEq by mouth daily.  11/30/17  Yes [name], MD  loratadine (CLARITIN) 10 MG tablet Take 1 tablet (10 mg total) by mouth daily. Patient not taking: Reported on 04/11/2019 11/15/18   Magrinat, [name], MD  ondansetron [facility])  8 MG  tablet Take 1 tablet (8 mg total) by mouth every 8 (eight) hours as needed for nausea or vomiting. Patient not taking: Reported on 04/09/2019 02/11/19   Magrinat, [name], MD  prochlorperazine (COMPAZINE) 10 MG tablet Take 1 tablet (10 mg total) by mouth every 6 (six) hours as needed (Nausea or vomiting). Patient not taking: Reported on 04/09/2019 11/15/18   Magrinat, [name], MD  traMADol (ULTRAM) 50 MG tablet Take 1-2 tablets (50-100 mg total) by mouth every 6 (six) hours as needed. Patient not taking: Reported on 04/09/2019 02/18/19   Magrinat, [name], MD  valACYclovir (VALTREX) 500 MG tablet Take 1 tablet (500 mg total) by mouth 2 (two) times daily. Patient not taking: Reported on 04/11/2019 12/31/18   Gardenia Phlegm, NP     Critical care time: 89

## 2019-04-13 NOTE — Progress Notes (Signed)
Pt complains of continued severe pain on Right side of chest and abdomen. Pt presents with SOB on ascultation, absent breath sounds on R lower lobe. Pt also complains of feeling the urge to urinate however unsuccessful when she attempts to void.  Interventions: On call physician notified.   Orders received: Dilaudid 0.5mg , bladder scan, in/out cath for >337mL, Chest x-ray.  Rn will continue to monitor patient and update on call physician.

## 2019-04-13 NOTE — Progress Notes (Signed)
Pt. Was not able to tolerate aerosol face mask physically on face during breathing treatment.  This writer had to hold mask away from patients face to deliver medication.

## 2019-04-13 NOTE — Progress Notes (Signed)
Pharmacy Antibiotic Note  Brittany Rodgers is a 48 y.o. female admitted on 04/11/2019 with pneumonia.  Pharmacy has been consulted for Vancomycin, cefepime dosing.  Plan: Vancomycin 1750gm iv x1, then Vancomycin 1000 mg IV Q 24 hrs. Goal AUC 400-550. Expected AUC: 464 SCr used: 0.93 and Vd = 0.5  Cefepime 2gm iv x1, then 2gm iv q8hr   Height: 5\' 2"  (157.5 cm) Weight: 181 lb 3.2 oz (82.2 kg) IBW/kg (Calculated) : 50.1  Temp (24hrs), Avg:99.7 F (37.6 C), Min:98.1 F (36.7 C), Max:102 F (38.9 C)  Recent Labs  Lab 04/11/19 2028 04/11/19 2117 04/12/19 0356 04/13/19 0511  WBC 9.6  --  8.8 9.1  CREATININE 0.73 0.60 0.68 0.93  LATICACIDVEN 1.4  --   --   --     Estimated Creatinine Clearance: 72.7 mL/min (by C-G formula based on SCr of 0.93 mg/dL).    Allergies  Allergen Reactions  . Barbiturates Other (See Comments)    Keeps pt awake  . Methocarbamol Itching  . Sulfonamide Derivatives Itching    Antimicrobials this admission: Vancomycin 04/13/2019 >> Cefepime 04/13/2019 >>   Dose adjustments this admission: -  Microbiology results: -  Thank you for allowing pharmacy to be a part of this patient's care.  [place] 04/13/2019 10:12 PM

## 2019-04-13 NOTE — Progress Notes (Addendum)
PROGRESS NOTE    Brittany Rodgers  UXL:[id number] DOB: Oct 07, 1970 DOA: 04/11/2019 PCP: [name], MD   Brief Narrative:  Brittany Rodgers is Brittany Rodgers 48 y.o. female with medical history significant of right breast cancer status post chemotherapy and mastectomy about [name] month ago also GERD that came in with shortness of breath and tachycardia.  Patient was tachypneic in the ER.  She was sent over for work-up for possible PE.  She has some chest pain but nothing out of the ordinary.  She has shortness of breath with exertion mainly.  No fever.  No nausea vomiting or diarrhea.  Patient was evaluated with CT angiogram of the chest showing no PE but patient has extensive pleural effusion on the right which looks complex possibly loculated.  Patient is being admitted for evaluation of her pleural effusion and sinus tachycardia..  ED Course: Temperature is 98.6 blood pressure 162/97 pulse 101 respiratory 95 oxygen sats 97% room air.  Sodium 140 potassium 3.3 chloride 105 CO2 25 glucose 105 BUN 11 creatinine 0.73.  White count 9.6 hemoglobin 10.1 platelets 423.  COVID-19 testing was negative.  CT angiogram of the chest showed no PE.  There was interval increase in size of right pleural effusion with associated partial compressive atelectasis of the right lower lobe.  Also right costal phrenic angle blunting.  Patient is therefore being admitted for work-up.  Assessment & Plan:   Principal Problem:   Pleural effusion Active Problems:   GERD   Hyperlipidemia   Hypokalemia   Malignant neoplasm of lower-outer quadrant of right breast of female, estrogen receptor negative ([place])   Port-[place]lett Goold-Cath in place   S/P mastectomy  Addendum: repeat CXR with large R pleural effusion noted to be increased in size since yesterday.  Pt with persistent SOB, tachypneic with increased WOB.  Will transfer to stepdown.  Order placed for thoracentesis by IR tomorrow, but given her increased WOB and tachypnea, will ask PCCM  evaluate her for [name] more urgent procedure.  Discussed with Dr. Oletta Darter.  #1 Right Sided Effusion   Shortness of Breath:  Shortness of breath most likely 2/2 effusion.  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   S/p thoracentesis 7/18 Exudative fluid by lights Follow culture and cytology Follow thora labs No fever or other obvious infectious sx, will hold on abx for now Continued shortness of breath today - will order scheduled and prn nebs (upper airway sounds today, like stridor, but now resolved, consider neck imaging, though with resolution will hold off for now - repeat CXR)  # Soft Tissue Mass at R cardiophrenic angle: discussed with rads who felt this was most c/w mass.  Discussed with oncology 7/18 (Dr. [name]) who noted pt could follow up as outpatient with [name].   Will discuss with [name] tomorrow  #2 stage IV breast cancer: Status post mastectomy.  Patient currently having immunotherapy.  She is scheduled to have follow-up with radiation oncology.  Completed chemotherapy.   She'll need to follow up outpatient with [name] if discharged prior to Monday Continues to have pain  #3 GERD: Continue with PPIs  #4 hypokalemia: Continue with potassium supplementation.  #5 hyperlipidemia: Continue home regimen.  DVT prophylaxis: heparin  Code Status: full  Family Communication: none at bedside Disposition Plan: pending further improvement   Consultants:   IR  Oncology over phone, [name]  Procedures:  US guided R thoracentesis  Antimicrobials:  Anti-infectives (From admission, onward)   Start     Dose/Rate Route  Frequency Ordered Stop   04/11/19 2245  vancomycin (VANCOCIN) IVPB 1000 mg/200 mL premix     1,000 mg 200 mL/hr over 60 Minutes Intravenous  Once 04/11/19 2241 04/12/19 0003   04/11/19 2245  piperacillin-tazobactam (ZOSYN) IVPB 3.375 g     3.375 g 100 mL/hr over 30 Minutes Intravenous  Once 04/11/19 2241 04/11/19 2354     Subjective: Continued SOB  today  Objective: Vitals:   04/13/19 0708 04/13/19 0947 04/13/19 1324 04/13/19 1410  BP: (!) 142/95  (!) 140/95   Pulse: (!) 103 (!) 102 (!) 110   Resp: 20 (!) 32 (!) 31   Temp: 99.5 F (37.5 C)  98.1 F (36.7 C)   TempSrc: Oral  Oral   SpO2: 92% 95% 96% 97%  Weight:      Height:        Intake/Output Summary (Last 24 hours) at 04/13/2019 1444 Last data filed at 04/13/2019 1130 Gross per 24 hour  Intake 2960.78 ml  Output 700 ml  Net 2260.78 ml   Filed Weights   04/11/19 2105  Weight: 82.2 kg    Examination:  General: No acute distress. Cardiovascular: Heart sounds show Aisia Correira regular rate, and rhythm.  Lungs: increased wob, stridor Abdomen: Soft, nontender, nondistended No masses. No hepatosplenomegaly. Neurological: Alert and oriented 3. Moves all extremities 4 . Cranial nerves II through XII grossly intact. Skin: Warm and dry. No rashes or lesions. Extremities: No clubbing or cyanosis. No edema.     Data Reviewed: I have personally reviewed following labs and imaging studies  CBC: Recent Labs  Lab 04/11/19 2028 04/11/19 2117 04/12/19 0356 04/13/19 0511  WBC 9.6  --  8.8 9.1  NEUTROABS 6.4  --   --   --   HGB 10.2* 10.9* 9.8* 10.5*  HCT 33.5* 32.0* 32.9* 34.2*  MCV 90.3  --  89.6 88.8  PLT 423*  --  399 856   Basic Metabolic Panel: Recent Labs  Lab 04/11/19 2028 04/11/19 2117 04/12/19 0356 04/13/19 0511  NA 142 140 139 135  K 3.3* 3.3* 3.4* 3.6  CL 105 104 103 99  CO2 25  --  24 25  GLUCOSE 105* 106* 110* 115*  BUN 11 7 8 18   CREATININE 0.73 0.60 0.68 0.93  CALCIUM 9.2  --  8.9 9.4  MG  --   --   --  2.0   GFR: Estimated Creatinine Clearance: 72.7 mL/min (by C-G formula based on SCr of 0.93 mg/dL). Liver Function Tests: Recent Labs  Lab 04/12/19 0356 04/13/19 0511  AST 11* 10*  ALT 30 27  ALKPHOS 93 100  BILITOT 0.4 0.6  PROT 6.8 7.4  ALBUMIN 3.3* 3.6   No results for input(s): LIPASE, AMYLASE in the last 168 hours. No results for  input(s): AMMONIA in the last 168 hours. Coagulation Profile: No results for input(s): INR, PROTIME in the last 168 hours. Cardiac Enzymes: No results for input(s): CKTOTAL, CKMB, CKMBINDEX, TROPONINI in the last 168 hours. BNP (last 3 results) No results for input(s): PROBNP in the last 8760 hours. HbA1C: No results for input(s): HGBA1C in the last 72 hours. CBG: No results for input(s): GLUCAP in the last 168 hours. Lipid Profile: No results for input(s): CHOL, HDL, LDLCALC, TRIG, CHOLHDL, LDLDIRECT in the last 72 hours. Thyroid Function Tests: No results for input(s): TSH, T4TOTAL, FREET4, T3FREE, THYROIDAB in the last 72 hours. Anemia Panel: No results for input(s): VITAMINB12, FOLATE, FERRITIN, TIBC, IRON, RETICCTPCT in the last 72  hours. Sepsis Labs: Recent Labs  Lab 04/11/19 2028  LATICACIDVEN 1.4    Recent Results (from the past 240 hour(s))  SARS Coronavirus 2 (CEPHEID - Performed in [facility] hospital lab), Hosp Order     Status: None   Collection Time: 04/11/19  8:28 PM   Specimen: Nasopharyngeal Swab  Result Value Ref Range Status   SARS Coronavirus 2 NEGATIVE NEGATIVE Final    Comment: (NOTE) If result is NEGATIVE SARS-CoV-2 target nucleic acids are NOT DETECTED. The SARS-CoV-2 RNA is generally detectable in upper and lower  respiratory specimens during the acute phase of infection. The lowest  concentration of SARS-CoV-2 viral copies this assay can detect is 250  copies / mL. Jamesha Ellsworth negative result does not preclude SARS-CoV-2 infection  and should not be used as the sole basis for treatment or other  patient management decisions.  Dlisa Barnwell negative result may occur with  improper specimen collection / handling, submission of specimen other  than nasopharyngeal swab, presence of viral mutation(s) within the  areas targeted by this assay, and inadequate number of viral copies  (<250 copies / mL). Rudy Luhmann negative result must be combined with clinical  observations, patient  history, and epidemiological information. If result is POSITIVE SARS-CoV-2 target nucleic acids are DETECTED. The SARS-CoV-2 RNA is generally detectable in upper and lower  respiratory specimens dur ing the acute phase of infection.  Positive  results are indicative of active infection with SARS-CoV-2.  Clinical  correlation with patient history and other diagnostic information is  necessary to determine patient infection status.  Positive results do  not rule out bacterial infection or co-infection with other viruses. If result is PRESUMPTIVE POSTIVE SARS-CoV-2 nucleic acids MAY BE PRESENT.   Ava Deguire presumptive positive result was obtained on the submitted specimen  and confirmed on repeat testing.  While 2019 novel coronavirus  (SARS-CoV-2) nucleic acids may be present in the submitted sample  additional confirmatory testing may be necessary for epidemiological  and / or clinical management purposes  to differentiate between  SARS-CoV-2 and other Sarbecovirus currently known to infect humans.  If clinically indicated additional testing with an alternate test  methodology [phone]) is advised. The SARS-CoV-2 RNA is generally  detectable in upper and lower respiratory sp ecimens during the acute  phase of infection. The expected result is Negative. Fact Sheet for Patients:  StrictlyIdeas.no Fact Sheet for Healthcare Providers: BankingDealers.co.za This test is not yet approved or cleared by the [place] FDA and has been authorized for detection and/or diagnosis of SARS-CoV-2 by FDA under an Emergency Use Authorization (EUA).  This EUA will remain in effect (meaning this test can be used) for the duration of the COVID-19 declaration under Section 564(b)(1) of the Act, 21 U.S.C. section 360bbb-3(b)(1), unless the authorization is terminated or revoked sooner. Performed at [facility], [place] [address], [place],  [place] [address]   Blood culture (routine x 2)     Status: None (Preliminary result)   Collection Time: 04/11/19  8:28 PM   Specimen: BLOOD  Result Value Ref Range Status   Specimen Description   Final    BLOOD Performed at [facility] [address], [place], [place] [address]    Special Requests   Final    BOTTLES DRAWN AEROBIC AND ANAEROBIC Blood Culture results may not be optimal due to an excessive volume of blood received in culture bottles Performed at [facility] [address] [place], [place] [address]  Culture   Final    NO GROWTH 1 DAY Performed at [facility], [place] [address] [place], [place] [address]    Report Status PENDING  Incomplete  Blood culture (routine x 2)     Status: None (Preliminary result)   Collection Time: 04/12/19  6:56 AM   Specimen: BLOOD LEFT HAND  Result Value Ref Range Status   Specimen Description   Final    BLOOD LEFT HAND Performed at [facility] [address] [place], [place] [address]    Special Requests   Final    BOTTLES DRAWN AEROBIC ONLY Blood Culture adequate volume Performed at [facility] [address], [place], [place] [address]    Culture   Final    NO GROWTH 1 DAY Performed at [facility], [place] [address] [place], [place] [address]    Report Status PENDING  Incomplete  Gram stain     Status: None (Preliminary result)   Collection Time: 04/12/19 12:17 PM   Specimen: Pleura  Result Value Ref Range Status   Specimen Description PLEURAL  Final   Special Requests NONE  Final   Gram Stain   Final    ABUNDANT WBC PRESENT, PREDOMINANTLY PMN NO ORGANISMS SEEN Performed at [facility], [place] [address], [place], [place] [address]    Report Status PENDING  Incomplete  Culture, body fluid-bottle     Status: None (Preliminary result)   Collection Time: 04/12/19 12:17 PM   Specimen: Pleura  Result Value Ref Range Status    Specimen Description PLEURAL  Final   Special Requests NONE  Final   Culture   Final    NO GROWTH < 24 HOURS Performed at [facility], [place] [address] [place], [place] [address]    Report Status PENDING  Incomplete         Radiology Studies: Ct Angio Chest Pe W And/or Wo Contrast  Result Date: 04/11/2019 CLINICAL DATA:  48 year old female with history of right breast cancer and recent mastectomy presenting with shortness of breath. EXAM: CT ANGIOGRAPHY CHEST WITH CONTRAST TECHNIQUE: Multidetector CT imaging of the chest was performed using the standard protocol during bolus administration of intravenous contrast. Multiplanar CT image reconstructions and MIPs were obtained to evaluate the vascular anatomy. CONTRAST:  145mL OMNIPAQUE IOHEXOL 350 MG/ML SOLN COMPARISON:  Chest CT dated 03/26/2019 and radiograph dated 04/09/2019 and CT dated 11/15/2018 FINDINGS: Cardiovascular: Borderline cardiomegaly. No pericardial effusion. The thoracic aorta is unremarkable. The origins of the great vessels of the aortic arch are patent as visualized. Evaluation of the pulmonary arteries is limited due to respiratory motion artifact and suboptimal visualization of the peripheral branches. No large or central pulmonary artery embolus identified. Mediastinum/Nodes: There is Jalicia Roszak 6.7 x 5.6 cm soft tissue appearing mass in the right cardiophrenic angle with infiltration of the anterior chest wall and extension into the subpectoral region. This is new compared to the CT of 11/15/2018 and has increased in size since the study of 03/26/2019 where it measured approximately 4.0 x 3.7 cm by my measurements. This may represent Antonya Leeder loculated malignant effusion versus Jerrelle Michelsen soft tissue metastasis. Although, the lobulated border and infiltration into the anterior chest wall suggest metastasis, significant interval increase in size over the course of 2 weeks is somewhat unusual. This can be better evaluated with ultrasound or MRI. No  definite mediastinal or hilar adenopathy identified. The esophagus is grossly unremarkable. Right pectoral Port-Dalana Pfahler-Cath with tip in  the region of the right atrium noted. Lungs/Pleura: There is Fremont Skalicky moderate right pleural effusion with associated partial compressive atelectasis of the right lower lobe as well as areas of atelectatic changes in the right upper lobe. There has been interval increase in the size of the pleural effusion and associated atelectasis since the prior CT. Linear left lung base atelectasis/scarring as well as Kikuye Korenek small left pleural effusion noted. There is no pneumothorax. The central airways are patent. Upper Abdomen: Ill-defined partially exophytic 3.2 x 2.7 cm lesion from the posterior right lobe of the liver as seen previously. There is Arnez Stoneking 4 mm right renal upper pole calculus. Musculoskeletal: Surgical clips in the right axilla and right mastectomy. Interval resolution of the right axillary fluid/seroma. Biopsy clip in the left breast. No acute osseous pathology. Review of the MIP images confirms the above findings. IMPRESSION: 1. No CT evidence of central pulmonary artery embolus. 2. Interval increase in the size of the right pleural effusion with associated partial compressive atelectasis of the right lower lobe. 3. Interval increase in the size of the right cardiophrenic angle soft tissue mass or loculated collection with infiltration of the anterior chest wall. This may represent Brittany Rodgers loculated malignant effusion versus Katelinn Justice soft tissue metastasis. This can be better evaluated with ultrasound or MRI. 4. Ill-defined partially exophytic lesion from the posterior right lobe of the liver as seen on the prior CT. 5. Interval resolution of the right axillary fluid/seroma. Electronically Signed   By: Anner Crete M.D.   On: 04/11/2019 22:08   Dg Chest Port 1 View  Result Date: 04/13/2019 CLINICAL DATA:  48 year old female with decreased breath sounds. EXAM: PORTABLE CHEST 1 VIEW COMPARISON:  Chest  radiograph dated 04/12/2019 FINDINGS: Right-sided Port-Daylene Vandenbosch-Cath in similar position. There is shallow inspiration with bibasilar atelectasis. Infiltrate is not excluded. Overall no significant interval change since the earlier radiograph. Probable small bilateral pleural effusions there is no pneumothorax. Stable cardiac silhouette. No acute osseous pathology. Right axillary surgical clips. IMPRESSION: Shallow inspiration with small bilateral pleural effusions and bibasilar atelectasis. Overall similar or slightly worsened since the prior radiograph. Electronically Signed   By: Anner Crete M.D.   On: 04/13/2019 01:13   Dg Chest Port 1 View  Result Date: 04/12/2019 CLINICAL DATA:  Post thoracentesis EXAM: PORTABLE CHEST 1 VIEW COMPARISON:  04/09/2019 FINDINGS: Slight interval reduction in volume of Emonnie Cannady right-sided pleural effusion. No significant pneumothorax. Otherwise unchanged examination with cardiomegaly and bandlike scarring or atelectasis of the left lung base. IMPRESSION: Slight interval reduction in volume of Bena Kobel right-sided pleural effusion. No significant pneumothorax. Otherwise unchanged examination with cardiomegaly and bandlike scarring or atelectasis of the left lung base. Electronically Signed   By: Eddie Candle M.D.   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 She presents for thoracentesis. EXAM: ULTRASOUND GUIDED RIGHT THORACENTESIS MEDICATIONS: None. COMPLICATIONS: None immediate. PROCEDURE: An ultrasound guided thoracentesis was thoroughly discussed with the patient and questions answered. The benefits, risks, alternatives and complications were also discussed. The patient understands and wishes to proceed with the procedure. Written consent was obtained. Ultrasound was performed to localize and mark an adequate pocket  of fluid in the right chest. The area was then prepped and draped in the normal sterile fashion. 1% Lidocaine was used for local anesthesia. Under ultrasound guidance Ikea Demicco 6 Fr Safe-T-Centesis catheter was introduced. Thoracentesis was performed. The  catheter was removed and Brenn Gatton dressing applied. FINDINGS: Josehua Hammar total of approximately 500 mL of bloody pleural fluid was removed. Samples were sent to the laboratory as requested by the clinical team. IMPRESSION: Successful ultrasound guided right thoracentesis yielding 500 mL of pleural fluid. Electronically Signed   By: Jacqulynn Cadet M.D.   On: 04/12/2019 13:09        Scheduled Meds:  atorvastatin  20 mg Oral Daily   cyclobenzaprine  10 mg Oral TID   gabapentin  300 mg Oral QHS   gabapentin  600 mg Oral BID   heparin  5,000 Units Subcutaneous Q8H   ipratropium-albuterol  3 mL Nebulization Q6H   metoprolol succinate  50 mg Oral Daily   pantoprazole  40 mg Oral Daily   potassium chloride  10 mEq Oral Daily   sodium chloride flush  10-40 mL Intracatheter Q12H   Continuous Infusions:  lactated ringers       LOS: 2 days    Time spent: over 30 min    Fayrene Helper, MD [facility] Pager AMION  If 7PM-7AM, please contact night-coverage [email or web address] Password [facility] 04/13/2019, 2:44 PM

## 2019-04-13 NOTE — Plan of Care (Signed)
  Problem: Education: Goal: Knowledge of General Education information will improve Description: Including pain rating scale, medication(s)/side effects and non-pharmacologic comfort measures Outcome: Progressing   Problem: Clinical Measurements: Goal: Cardiovascular complication will be avoided Outcome: Progressing   Problem: Nutrition: Goal: Adequate nutrition will be maintained Outcome: Progressing   Problem: Elimination: Goal: Will not experience complications related to bowel motility Outcome: Progressing Goal: Will not experience complications related to urinary retention Outcome: Progressing   Problem: Pain Managment: Goal: General experience of comfort will improve Outcome: Progressing   Problem: Safety: Goal: Ability to remain free from injury will improve Outcome: Progressing   Problem: Skin Integrity: Goal: Risk for impaired skin integrity will decrease Outcome: Progressing

## 2019-04-14 ENCOUNTER — Other Ambulatory Visit: Payer: Self-pay | Admitting: *Deleted

## 2019-04-14 ENCOUNTER — Telehealth: Payer: Self-pay | Admitting: Radiation Oncology

## 2019-04-14 LAB — COMPREHENSIVE METABOLIC PANEL
ALT: 28 U/L (ref 0–44)
AST: 12 U/L — ABNORMAL LOW (ref 15–41)
Albumin: 3.3 g/dL — ABNORMAL LOW (ref 3.5–5.0)
Alkaline Phosphatase: 97 U/L (ref 38–126)
Anion gap: 11 (ref 5–15)
BUN: 13 mg/dL (ref 6–20)
CO2: 25 mmol/L (ref 22–32)
Calcium: 8.9 mg/dL (ref 8.9–10.3)
Chloride: 104 mmol/L (ref 98–111)
Creatinine, Ser: 0.84 mg/dL (ref 0.44–1.00)
GFR calc Af Amer: >60 mL/min (ref >60)
GFR calc non Af Amer: >60 mL/min (ref >60)
Glucose, Bld: 115 mg/dL — ABNORMAL HIGH (ref 70–99)
Potassium: 3.8 mmol/L (ref 3.5–5.1)
Sodium: 140 mmol/L (ref 135–145)
Total Bilirubin: 0.4 mg/dL (ref 0.3–1.2)
Total Protein: 6.9 g/dL (ref 6.5–8.1)

## 2019-04-14 LAB — CBC
HCT: 35.6 % — ABNORMAL LOW (ref 36.0–46.0)
Hemoglobin: 10.3 g/dL — ABNORMAL LOW (ref 12.0–15.0)
MCH: 26.4 pg (ref 26.0–34.0)
MCHC: 28.9 g/dL — ABNORMAL LOW (ref 30.0–36.0)
MCV: 91.3 fL (ref 80.0–100.0)
Platelets: 352 10*3/uL (ref 150–400)
RBC: 3.9 MIL/uL (ref 3.87–5.11)
RDW: 18.7 % — ABNORMAL HIGH (ref 11.5–15.5)
WBC: 6.2 10*3/uL (ref 4.0–10.5)
nRBC: 0 % (ref 0.0–0.2)

## 2019-04-14 LAB — MRSA PCR SCREENING: MRSA by PCR: NEGATIVE

## 2019-04-14 LAB — MAGNESIUM: Magnesium: 1.7 mg/dL (ref 1.7–2.4)

## 2019-04-14 LAB — PH, BODY FLUID: pH, Body Fluid: 7.4

## 2019-04-14 LAB — PATHOLOGIST SMEAR REVIEW

## 2019-04-14 MED ORDER — CHLORHEXIDINE GLUCONATE CLOTH 2 % EX PADS
6.0000 | MEDICATED_PAD | Freq: Every day | CUTANEOUS | Status: DC
  Administered 2019-04-14 – 2019-04-15 (×2): 6 via TOPICAL

## 2019-04-14 NOTE — Progress Notes (Signed)
PROGRESS NOTE    Brittany Rodgers  MLY:[id number] DOB: 1971/03/26 DOA: 04/11/2019 PCP: [name], MD   Brief Narrative:  Brittany Rodgers is Brittany Rodgers 49 y.o. female with medical history significant of right breast cancer status post chemotherapy and mastectomy about Brittany Rodgers month ago also GERD that came in with shortness of breath and tachycardia.  Patient was tachypneic in the ER.  She was sent over for work-up for possible PE.  She has some chest pain but nothing out of the ordinary.  She has shortness of breath with exertion mainly.  No fever.  No nausea vomiting or diarrhea.  Patient was evaluated with CT angiogram of the chest showing no PE but patient has extensive pleural effusion on the right which looks complex possibly loculated.  Patient is being admitted for evaluation of her pleural effusion and sinus tachycardia..  ED Course: Temperature is 98.6 blood pressure 162/97 pulse 101 respiratory 95 oxygen sats 97% room air.  Sodium 140 potassium 3.3 chloride 105 CO2 25 glucose 105 BUN 11 creatinine 0.73.  White count 9.6 hemoglobin 10.1 platelets 423.  COVID-19 testing was negative.  CT angiogram of the chest showed no PE.  There was interval increase in size of right pleural effusion with associated partial compressive atelectasis of the right lower lobe.  Also right costal phrenic angle blunting.  Patient is therefore being admitted for work-up.  Assessment & Plan:   Principal Problem:   Pleural effusion Active Problems:   GERD   Hyperlipidemia   Hypokalemia   Malignant neoplasm of lower-outer quadrant of right breast of female, estrogen receptor negative (HCC)   Port-[place]in Kuhnle-Cath in place   S/P mastectomy  #1 Right Sided Effusion   Shortness of Breath:  Shortness of breath most likely 2/2 effusion.  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   S/p thoracentesis 7/18 Exudative fluid by lights Follow culture (ngtd x 2 days) and cytology (pending) Follow thora labs - normal pH Abx as noted  below Appreciate pulmonology and oncology recommendations  # Fever: x1 to 102 on 7/19.  Started on broad spectrum abx and recultured. Will d/c flagyl Continue vanc/cefepime for now  # Soft Tissue Mass at R cardiophrenic angle: discussed with rads who felt this was most c/w mass.  Discussed with oncology 7/18 (Dr. [name]) who noted pt could follow up as outpatient with [name].   [name] not available, appreciate c/s from Dr. Benay Spice   #2 Breast cancer: Status post mastectomy.  Patient currently having immunotherapy.  She is scheduled to have follow-up with radiation oncology.  Completed chemotherapy.   Oncology c/s, appreciate recommendations  #3 GERD: Continue with PPIs  #4 hypokalemia: Continue with potassium supplementation.  #5 hyperlipidemia: Continue home regimen.  DVT prophylaxis: heparin  Code Status: full  Family Communication: none at bedside Disposition Plan: pending further improvement   Consultants:   IR  Oncology over phone, [name]  Procedures:  US guided R thoracentesis  Antimicrobials:  Anti-infectives (From admission, onward)   Start     Dose/Rate Route Frequency Ordered Stop   04/14/19 2200  vancomycin (VANCOCIN) IVPB 1000 mg/200 mL premix     1,000 mg 200 mL/hr over 60 Minutes Intravenous Every 24 hours 04/13/19 2210     04/13/19 2215  vancomycin (VANCOCIN) 1,750 mg in sodium chloride 0.9 % 500 mL IVPB  Status:  Discontinued     1,750 mg 250 mL/hr over 120 Minutes Intravenous  Once 04/13/19 2203 04/13/19 2209   04/13/19 2215  ceFEPIme (MAXIPIME) 2  g in sodium chloride 0.9 % 100 mL IVPB     2 g 200 mL/hr over 30 Minutes Intravenous Every 8 hours 04/13/19 2203     04/13/19 2215  vancomycin (VANCOCIN) 1,500 mg in sodium chloride 0.9 % 500 mL IVPB     1,500 mg 250 mL/hr over 120 Minutes Intravenous  Once 04/13/19 2209 04/14/19 0150   04/13/19 2200  metroNIDAZOLE (FLAGYL) tablet 500 mg  Status:  Discontinued     500 mg Oral Every 8 hours  04/13/19 2156 04/14/19 0918   04/11/19 2245  vancomycin (VANCOCIN) IVPB 1000 mg/200 mL premix     1,000 mg 200 mL/hr over 60 Minutes Intravenous  Once 04/11/19 2241 04/12/19 0003   04/11/19 2245  piperacillin-tazobactam (ZOSYN) IVPB 3.375 g     3.375 g 100 mL/hr over 30 Minutes Intravenous  Once 04/11/19 2241 04/11/19 2354     Subjective: Feels [name] little better than yesterday  Objective: Vitals:   04/14/19 0800 04/14/19 0822 04/14/19 0830 04/14/19 1039  BP:   (!) 125/92 110/64  Pulse:   (!) 101 (!) 107  Resp:   (!) 22 (!) 26  Temp: 97.8 F (36.6 C)     TempSrc: Oral     SpO2:  96% 100% 95%  Weight:      Height:        Intake/Output Summary (Last 24 hours) at 04/14/2019 1210 Last data filed at 04/14/2019 1005 Gross per 24 hour  Intake 840 ml  Output 4150 ml  Net -3310 ml   Filed Weights   04/11/19 2105  Weight: 82.2 kg    Examination:  General: No acute distress. Cardiovascular: Heart sounds show [name] tachy rate and rhythm Lungs: increased wob Abdomen: Soft, nontender, nondistended  Neurological: Alert and oriented 3. Moves all extremities 4 . Cranial nerves II through XII grossly intact. Skin: Warm and dry. No rashes or lesions. Extremities: No clubbing or cyanosis. No edema  Data Reviewed: I have personally reviewed following labs and imaging studies  CBC: Recent Labs  Lab 04/11/19 2028 04/11/19 2117 04/12/19 0356 04/13/19 0511 04/14/19 0150  WBC 9.6  --  8.8 9.1 6.2  NEUTROABS 6.4  --   --   --   --   HGB 10.2* 10.9* 9.8* 10.5* 10.3*  HCT 33.5* 32.0* 32.9* 34.2* 35.6*  MCV 90.3  --  89.6 88.8 91.3  PLT 423*  --  399 377 102   Basic Metabolic Panel: Recent Labs  Lab 04/11/19 2028 04/11/19 2117 04/12/19 0356 04/13/19 0511 04/14/19 0150  NA 142 140 139 135 140  K 3.3* 3.3* 3.4* 3.6 3.8  CL 105 104 103 99 104  CO2 25  --  24 25 25   GLUCOSE 105* 106* 110* 115* 115*  BUN 11 7 8 18 13   CREATININE 0.73 0.60 0.68 0.93 0.84  CALCIUM 9.2  --  8.9 9.4  8.9  MG  --   --   --  2.0 1.7   GFR: Estimated Creatinine Clearance: 80.4 mL/min (by C-G formula based on SCr of 0.84 mg/dL). Liver Function Tests: Recent Labs  Lab 04/12/19 0356 04/13/19 0511 04/14/19 0150  AST 11* 10* 12*  ALT 30 27 28   ALKPHOS 93 100 97  BILITOT 0.4 0.6 0.4  PROT 6.8 7.4 6.9  ALBUMIN 3.3* 3.6 3.3*   No results for input(s): LIPASE, AMYLASE in the last 168 hours. No results for input(s): AMMONIA in the last 168 hours. Coagulation Profile: Recent Labs  Lab 04/13/19  2016  INR 1.1   Cardiac Enzymes: No results for input(s): CKTOTAL, CKMB, CKMBINDEX, TROPONINI in the last 168 hours. BNP (last 3 results) No results for input(s): PROBNP in the last 8760 hours. HbA1C: No results for input(s): HGBA1C in the last 72 hours. CBG: No results for input(s): GLUCAP in the last 168 hours. Lipid Profile: No results for input(s): CHOL, HDL, LDLCALC, TRIG, CHOLHDL, LDLDIRECT in the last 72 hours. Thyroid Function Tests: No results for input(s): TSH, T4TOTAL, FREET4, T3FREE, THYROIDAB in the last 72 hours. Anemia Panel: No results for input(s): VITAMINB12, FOLATE, FERRITIN, TIBC, IRON, RETICCTPCT in the last 72 hours. Sepsis Labs: Recent Labs  Lab 04/11/19 2028  LATICACIDVEN 1.4    Recent Results (from the past 240 hour(s))  SARS Coronavirus 2 (CEPHEID - Performed in [facility] hospital lab), Hosp Order     Status: None   Collection Time: 04/11/19  8:28 PM   Specimen: Nasopharyngeal Swab  Result Value Ref Range Status   SARS Coronavirus 2 NEGATIVE NEGATIVE Final    Comment: (NOTE) If result is NEGATIVE SARS-CoV-2 target nucleic acids are NOT DETECTED. The SARS-CoV-2 RNA is generally detectable in upper and lower  respiratory specimens during the acute phase of infection. The lowest  concentration of SARS-CoV-2 viral copies this assay can detect is 250  copies / mL. Jaice Digioia negative result does not preclude SARS-CoV-2 infection  and should not be used as the sole  basis for treatment or other  patient management decisions.  Gredmarie Delange negative result may occur with  improper specimen collection / handling, submission of specimen other  than nasopharyngeal swab, presence of viral mutation(s) within the  areas targeted by this assay, and inadequate number of viral copies  (<250 copies / mL). Angel Weedon negative result must be combined with clinical  observations, patient history, and epidemiological information. If result is POSITIVE SARS-CoV-2 target nucleic acids are DETECTED. The SARS-CoV-2 RNA is generally detectable in upper and lower  respiratory specimens dur ing the acute phase of infection.  Positive  results are indicative of active infection with SARS-CoV-2.  Clinical  correlation with patient history and other diagnostic information is  necessary to determine patient infection status.  Positive results do  not rule out bacterial infection or co-infection with other viruses. If result is PRESUMPTIVE POSTIVE SARS-CoV-2 nucleic acids MAY BE PRESENT.   Annibelle Brazie presumptive positive result was obtained on the submitted specimen  and confirmed on repeat testing.  While 2019 novel coronavirus  (SARS-CoV-2) nucleic acids may be present in the submitted sample  additional confirmatory testing may be necessary for epidemiological  and / or clinical management purposes  to differentiate between  SARS-CoV-2 and other Sarbecovirus currently known to infect humans.  If clinically indicated additional testing with an alternate test  methodology [phone]) is advised. The SARS-CoV-2 RNA is generally  detectable in upper and lower respiratory sp ecimens during the acute  phase of infection. The expected result is Negative. Fact Sheet for Patients:  StrictlyIdeas.no Fact Sheet for Healthcare Providers: BankingDealers.co.za This test is not yet approved or cleared by the [place] FDA and has been authorized for detection  and/or diagnosis of SARS-CoV-2 by FDA under an Emergency Use Authorization (EUA).  This EUA will remain in effect (meaning this test can be used) for the duration of the COVID-19 declaration under Section 564(b)(1) of the Act, 21 U.S.C. section 360bbb-3(b)(1), unless the authorization is terminated or revoked sooner. Performed at [facility], [address] [place], [place]  [address]   Blood culture (routine x 2)     Status: None (Preliminary result)   Collection Time: 04/11/19  8:28 PM   Specimen: BLOOD  Result Value Ref Range Status   Specimen Description   Final    BLOOD Performed at [facility] [address], [place], [place] [address]    Special Requests   Final    BOTTLES DRAWN AEROBIC AND ANAEROBIC Blood Culture results may not be optimal due to an excessive volume of blood received in culture bottles Performed at [place] [address] [place], [place] [address]    Culture   Final    NO GROWTH 2 DAYS Performed at [facility] [address], [place], [place] [address]    Report Status PENDING  Incomplete  Blood culture (routine x 2)     Status: None (Preliminary result)   Collection Time: 04/12/19  6:56 AM   Specimen: BLOOD LEFT HAND  Result Value Ref Range Status   Specimen Description   Final    BLOOD LEFT HAND Performed at [facility] [address] [place], [place] [address]    Special Requests   Final    BOTTLES DRAWN AEROBIC ONLY Blood Culture adequate volume Performed at [facility] [address], [place], [place] [address]    Culture   Final    NO GROWTH 2 DAYS Performed at [facility] [address] [place], [place] [address]    Report Status PENDING  Incomplete  Gram stain     Status: None   Collection Time: 04/12/19 12:17 PM   Specimen: Pleura  Result Value Ref Range Status   Specimen Description PLEURAL  Final   Special  Requests NONE  Final   Gram Stain   Final    ABUNDANT WBC PRESENT, PREDOMINANTLY PMN NO ORGANISMS SEEN Performed at [facility], [place] [address] [place], [place] [address]    Report Status 04/13/2019 FINAL  Final  Culture, body fluid-bottle     Status: None (Preliminary result)   Collection Time: 04/12/19 12:17 PM   Specimen: Pleura  Result Value Ref Range Status   Specimen Description PLEURAL  Final   Special Requests NONE  Final   Culture   Final    NO GROWTH 2 DAYS Performed at [facility] [address], [place], [place] [address]    Report Status PENDING  Incomplete  MRSA PCR Screening     Status: None   Collection Time: 04/14/19  1:19 AM   Specimen: Nasal Mucosa; Nasopharyngeal  Result Value Ref Range Status   MRSA by PCR NEGATIVE NEGATIVE Final    Comment:        The GeneXpert MRSA Assay (FDA approved for NASAL specimens only), is one component of Deionna Marcantonio comprehensive MRSA colonization surveillance program. It is not intended to diagnose MRSA infection nor to guide or monitor treatment for MRSA infections. Performed at [facility], [place] [address] [place],  [address]          Radiology Studies: Dg Chest 2 View  Result Date: 04/13/2019 CLINICAL DATA:  48 year old with RIGHT mastectomy on 03/04/2019 for breast cancer, currently being admitted due to Bernetta Sutley RIGHT pleural effusion. EXAM: CHEST - 2 VIEW 5:30 p.m.: COMPARISON:  Portable chest x-ray earlier same day at 12:43 Chelan Heringer.m. and previous chest x-rays. CT chest 04/11/2019 and earlier. FINDINGS: AP SEMI-ERECT and LATERAL images were obtained. Cardiac  silhouette mildly enlarged even allowing for AP technique, unchanged. Large RIGHT pleural effusion with Mishal Probert subpulmonic component, unchanged since the examination earlier today, increased in size since yesterday. Associated passive atelectasis in the RIGHT LOWER LOBE. Mild linear atelectasis in the LEFT LOWER LOBE as noted on the recent CT. No  new pulmonary parenchymal abnormalities. RIGHT jugular Port-Genieve Ramaswamy-Cath tip at or near the cavoatrial junction. Post surgical changes related to RIGHT mastectomy and axillary node removal. IMPRESSION: 1. Large RIGHT pleural effusion with Keoki Mchargue subpulmonic component, increased in size since yesterday. Associated passive atelectasis in the RIGHT LOWER LOBE. 2. Mild linear atelectasis in the LEFT LOWER LOBE as noted on the recent CT. 3. No new abnormalities. Electronically Signed   By: Evangeline Dakin M.D.   On: 04/13/2019 18:11   Dg Chest Port 1 View  Result Date: 04/13/2019 CLINICAL DATA:  48 year old female with decreased breath sounds. EXAM: PORTABLE CHEST 1 VIEW COMPARISON:  Chest radiograph dated 04/12/2019 FINDINGS: Right-sided Port-Perline Awe-Cath in similar position. There is shallow inspiration with bibasilar atelectasis. Infiltrate is not excluded. Overall no significant interval change since the earlier radiograph. Probable small bilateral pleural effusions there is no pneumothorax. Stable cardiac silhouette. No acute osseous pathology. Right axillary surgical clips. IMPRESSION: Shallow inspiration with small bilateral pleural effusions and bibasilar atelectasis. Overall similar or slightly worsened since the prior radiograph. Electronically Signed   By: Anner Crete M.D.   On: 04/13/2019 01:13   Dg Chest Port 1 View  Result Date: 04/12/2019 CLINICAL DATA:  Post thoracentesis EXAM: PORTABLE CHEST 1 VIEW COMPARISON:  04/09/2019 FINDINGS: Slight interval reduction in volume of Andjela Wickes right-sided pleural effusion. No significant pneumothorax. Otherwise unchanged examination with cardiomegaly and bandlike scarring or atelectasis of the left lung base. IMPRESSION: Slight interval reduction in volume of Alphonse Asbridge right-sided pleural effusion. No significant pneumothorax. Otherwise unchanged examination with cardiomegaly and bandlike scarring or atelectasis of the left lung base. Electronically Signed   By: Eddie Candle M.D.   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 She presents for thoracentesis. EXAM: ULTRASOUND GUIDED RIGHT THORACENTESIS MEDICATIONS: None. COMPLICATIONS: None immediate. PROCEDURE: An ultrasound guided thoracentesis was thoroughly discussed with the patient and questions answered. The benefits, risks, alternatives and complications were also discussed. The patient understands and wishes to proceed with the procedure. Written consent was obtained. Ultrasound was performed to localize and mark an adequate pocket of fluid in the right chest. The area was then prepped and draped in the normal sterile fashion. 1% Lidocaine was used for local anesthesia. Under ultrasound guidance Johnaton Sonneborn 6 Fr Safe-T-Centesis catheter was introduced. Thoracentesis was performed. The catheter was removed and Johnasia Liese dressing applied. FINDINGS: Colden Samaras total of approximately 500 mL of bloody pleural fluid was removed. Samples were sent to the laboratory as requested by the clinical team. IMPRESSION: Successful ultrasound guided right thoracentesis yielding 500 mL of pleural fluid. Electronically Signed   By: Jacqulynn Cadet M.D.   On: 04/12/2019 13:09        Scheduled Meds:  atorvastatin  20 mg Oral Daily   Chlorhexidine Gluconate Cloth  6 each Topical Daily   cyclobenzaprine  10 mg Oral TID   gabapentin  300 mg Oral QHS   gabapentin  600 mg Oral BID   heparin  5,000 Units Subcutaneous Q8H   ipratropium-albuterol  3 mL Nebulization TID   metoprolol succinate  50 mg  Oral Daily   pantoprazole  40 mg Oral Daily   potassium chloride  10 mEq Oral Daily   sodium chloride flush  10-40 mL Intracatheter Q12H   Continuous Infusions:  ceFEPime (MAXIPIME) IV Stopped (04/14/19 0826)   vancomycin       LOS: 3 days    Time spent: over 30 min    Fayrene Helper, MD [facility] Pager AMION  If 7PM-7AM, please contact night-coverage [email or web address] Password [email or web address] 04/14/2019, 12:10 PM

## 2019-04-14 NOTE — Progress Notes (Signed)
Pt states that she does not wear CPAP at home and is not interested in wearing one at this time.  RT to monitor and assess as needed.

## 2019-04-14 NOTE — Progress Notes (Addendum)
HEMATOLOGY-ONCOLOGY PROGRESS NOTE  SUBJECTIVE: Brittany Rodgers was admitted for management of her shortness of breath and tachycardia. CT showed a pleural effusion. She is currently followed by 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  She received neoadjuvant chemotherapy consisting of doxorubicin and cyclophosphamide given a dose dense fashion from 11/26/2018 through 01/07/2019 followed by weekly paclitaxel and carboplatin x12 cycles starting on 01/28/2019.  She underwent a right mastectomy on 03/04/2019.  Due to begin adjuvant radiation in the near future.  Pembrolizumab was started on 04/02/2019.  A prior CT scan showed a possible liver metastasis but liver biopsy on 11/25/2018 showed hemangioma.  The patient states that she was seen by her gynecologist and found to have tachycardia.  She was referred to her primary care provider who sent her to the hospital for a scan.  CT angiogram the chest showed no PE, but there was an interval increase in the size of the right pleural effusion with associated partial compressive atelectasis of the right lower lobe, interval increase in the size of the right cardiophrenic angle soft tissue mass or loculated collection with infiltration of the anterior chest wall which could represent a loculated malignant effusion versus soft tissue metastasis, the ill-defined partially exophytic lesion from the posterior right lobe of the liver was again seen.  The patient underwent a thoracentesis on 04/12/2019 with 500 cc of pleural fluid removed.  Fluid has been sent for Gram stain and culture which were negative.  Cytology is pending.  Today she complains of ongoing pain to her right chest and posterior ribs.  States that the pain was worse following the thoracentesis.  Reports that her breathing has improved.  Had a fever up to 102 last evening.  Denies  cough.  Denies abdominal pain, nausea, vomiting.  Oncology History  Malignant neoplasm of lower-outer quadrant of right breast of female, estrogen receptor negative ([place])  11/04/2018 Initial Diagnosis   Malignant neoplasm of lower-outer quadrant of right breast of female, estrogen receptor negative ([place])   11/26/2018 -  Chemotherapy   The patient had DOXOrubicin (ADRIAMYCIN) chemo injection 106 mg, 60 mg/m2 = 106 mg, Intravenous,  Once, 4 of 4 cycles Administration: 106 mg (11/26/2018), 106 mg (12/10/2018), 106 mg (12/24/2018), 106 mg (01/07/2019) palonosetron (ALOXI) injection 0.25 mg, 0.25 mg, Intravenous,  Once, 6 of 10 cycles Administration: 0.25 mg (11/26/2018), 0.25 mg (01/28/2019), 0.25 mg (12/10/2018), 0.25 mg (12/24/2018), 0.25 mg (01/07/2019), 0.25 mg (02/11/2019), 0.25 mg (02/04/2019), 0.25 mg (02/18/2019) pegfilgrastim-cbqv (UDENYCA) injection 6 mg, 6 mg, Subcutaneous, Once, 4 of 4 cycles Administration: 6 mg (12/12/2018), 6 mg (12/26/2018), 6 mg (01/09/2019) CARBOplatin (PARAPLATIN) 240 mg in sodium chloride 0.9 % 250 mL chemo infusion, 240 mg (99 % of original dose 242.4 mg), Intravenous,  Once, 2 of 6 cycles Dose modification:   (original dose 242.4 mg, Cycle 5), 240 mg (original dose 242.4 mg, Cycle 5),   (original dose 242.4 mg, Cycle 6, Reason: Provider Judgment) Administration: 240 mg (01/28/2019), 240 mg (02/11/2019), 240 mg (02/04/2019), 240 mg (02/18/2019) cyclophosphamide (CYTOXAN) 1,060 mg in sodium chloride 0.9 % 250 mL chemo infusion, 600 mg/m2 = 1,060 mg, Intravenous,  Once, 4 of 4 cycles Administration: 1,060 mg (11/26/2018), 1,060 mg (12/10/2018), 1,060 mg (12/24/2018), 1,060 mg (01/07/2019) PACLitaxel (TAXOL) 144 mg in sodium chloride 0.9 % 250 mL chemo infusion (</= 80mg /m2), 80 mg/m2 = 144 mg, Intravenous,  Once,  2 of 6 cycles Administration: 144 mg (01/28/2019), 144 mg (02/11/2019), 144 mg (02/04/2019), 144 mg (02/18/2019) fosaprepitant (EMEND) 150 mg, dexamethasone (DECADRON) 12 mg in sodium  chloride 0.9 % 145 mL IVPB, , Intravenous,  Once, 5 of 5 cycles Administration:  (11/26/2018),  (01/28/2019),  (12/10/2018),  (12/24/2018),  (01/07/2019)  for chemotherapy treatment.    04/02/2019 -  Chemotherapy   The patient had pembrolizumab (KEYTRUDA) 200 mg in sodium chloride 0.9 % 50 mL chemo infusion, 200 mg, Intravenous, Once, 1 of 6 cycles Administration: 200 mg (04/02/2019)  for chemotherapy treatment.       REVIEW OF SYSTEMS:   Constitutional: Had a fever up to 102 last evening Eyes: Denies blurriness of vision Ears, nose, mouth, throat, and face: Denies mucositis or sore throat Respiratory: Denies cough.  Shortness of breath has improved. Cardiovascular: Reports right sided chest/posterior rib pain Gastrointestinal:  Denies nausea, heartburn or change in bowel habits Skin: Denies abnormal skin rashes Lymphatics: Denies Brittany lymphadenopathy or easy bruising Neurological:Denies numbness, tingling or Brittany weaknesses Behavioral/Psych: Mood is stable, no Brittany changes  Extremities: No lower extremity edema All other systems were reviewed with the patient and are negative.  I have reviewed the past medical history, past surgical history, social history and family history with the patient and they are unchanged from previous note.   PHYSICAL EXAMINATION:  Vitals:   04/14/19 0822 04/14/19 0830  BP:  (!) 125/92  Pulse:  (!) 101  Resp:  (!) 22  Temp:    SpO2: 96% 100%   Filed Weights   04/11/19 2105  Weight: 181 lb 3.2 oz (82.2 kg)    Intake/Output from previous day: 07/19 0701 - 07/20 0700 In: 720 [P.O.:720] Out: 3700 [Urine:3700]  GENERAL:alert, no distress and comfortable SKIN: skin color, texture, turgor are normal, no rashes or significant lesions OROPHARYNX: No thrush or mucositis LYMPH:  no palpable lymphadenopathy in the cervical, axillary or inguinal LUNGS: Diminished to the right base otherwise clear HEART: Tachycardia, no murmurs and no lower extremity  edema ABDOMEN:abdomen soft, non-tender and normal bowel sounds Musculoskeletal:no cyanosis of digits and no clubbing  NEURO: alert & oriented x 3 with fluent speech, no focal motor/sensory deficits  LABORATORY DATA:  I have reviewed the data as listed CMP Latest Ref Rng & Units 04/14/2019 04/13/2019 04/12/2019  Glucose 70 - 99 mg/dL 115(H) 115(H) 110(H)  BUN 6 - 20 mg/dL 13 18 8   Creatinine 0.44 - 1.00 mg/dL 0.84 0.93 0.68  Sodium 135 - 145 mmol/L 140 135 139  Potassium 3.5 - 5.1 mmol/L 3.8 3.6 3.4(L)  Chloride 98 - 111 mmol/L 104 99 103  CO2 22 - 32 mmol/L 25 25 24   Calcium 8.9 - 10.3 mg/dL 8.9 9.4 8.9  Total Protein 6.5 - 8.1 g/dL 6.9 7.4 6.8  Total Bilirubin 0.3 - 1.2 mg/dL 0.4 0.6 0.4  Alkaline Phos 38 - 126 U/L 97 100 93  AST 15 - 41 U/L 12(L) 10(L) 11(L)  ALT 0 - 44 U/L 28 27 30     Lab Results  Component Value Date   WBC 6.2 04/14/2019   HGB 10.3 (L) 04/14/2019   HCT 35.6 (L) 04/14/2019   MCV 91.3 04/14/2019   PLT 352 04/14/2019   NEUTROABS 6.4 04/11/2019    Dg Chest 2 View  Result Date: 04/13/2019 CLINICAL DATA:  48 year old with RIGHT mastectomy on 03/04/2019 for breast cancer, currently being admitted due to a RIGHT pleural effusion. EXAM: CHEST - 2 VIEW 5:30 p.m.: COMPARISON:  Portable  chest x-ray earlier same day at 12:43 a.m. and previous chest x-rays. CT chest 04/11/2019 and earlier. FINDINGS: AP SEMI-ERECT and LATERAL images were obtained. Cardiac silhouette mildly enlarged even allowing for AP technique, unchanged. Large RIGHT pleural effusion with a subpulmonic component, unchanged since the examination earlier today, increased in size since yesterday. Associated passive atelectasis in the RIGHT LOWER LOBE. Mild linear atelectasis in the LEFT LOWER LOBE as noted on the recent CT. No Brittany pulmonary parenchymal abnormalities. RIGHT jugular Port-A-Cath tip at or near the cavoatrial junction. Post surgical changes related to RIGHT mastectomy and axillary node removal.  IMPRESSION: 1. Large RIGHT pleural effusion with a subpulmonic component, increased in size since yesterday. Associated passive atelectasis in the RIGHT LOWER LOBE. 2. Mild linear atelectasis in the LEFT LOWER LOBE as noted on the recent CT. 3. No Brittany abnormalities. Electronically Signed   By: Evangeline Dakin M.D.   On: 04/13/2019 18:11   Dg Chest 2 View  Result Date: 04/10/2019 CLINICAL DATA:  Shortness of breath. Right breast carcinoma. Gastroesophageal reflux disease. EXAM: CHEST - 2 VIEW COMPARISON:  12/06/2018 FINDINGS: Heart size is normal. Right-sided power port remains in place. Brittany infiltrate or atelectasis is seen in both lung bases, right side greater than left. No evidence of pleural effusion. IMPRESSION: Brittany bibasilar atelectasis versus infiltrates, right side greater than left. Electronically Signed   By: Marlaine Hind M.D.   On: 04/10/2019 08:11   Ct Angio Chest Pe W And/or Wo Contrast  Result Date: 04/11/2019 CLINICAL DATA:  48 year old female with history of right breast cancer and recent mastectomy presenting with shortness of breath. EXAM: CT ANGIOGRAPHY CHEST WITH CONTRAST TECHNIQUE: Multidetector CT imaging of the chest was performed using the standard protocol during bolus administration of intravenous contrast. Multiplanar CT image reconstructions and MIPs were obtained to evaluate the vascular anatomy. CONTRAST:  153mL OMNIPAQUE IOHEXOL 350 MG/ML SOLN COMPARISON:  Chest CT dated 03/26/2019 and radiograph dated 04/09/2019 and CT dated 11/15/2018 FINDINGS: Cardiovascular: Borderline cardiomegaly. No pericardial effusion. The thoracic aorta is unremarkable. The origins of the great vessels of the aortic arch are patent as visualized. Evaluation of the pulmonary arteries is limited due to respiratory motion artifact and suboptimal visualization of the peripheral branches. No large or central pulmonary artery embolus identified. Mediastinum/Nodes: There is a 6.7 x 5.6 cm soft tissue  appearing mass in the right cardiophrenic angle with infiltration of the anterior chest wall and extension into the subpectoral region. This is Brittany compared to the CT of 11/15/2018 and has increased in size since the study of 03/26/2019 where it measured approximately 4.0 x 3.7 cm by my measurements. This may represent a loculated malignant effusion versus a soft tissue metastasis. Although, the lobulated border and infiltration into the anterior chest wall suggest metastasis, significant interval increase in size over the course of 2 weeks is somewhat unusual. This can be better evaluated with ultrasound or MRI. No definite mediastinal or hilar adenopathy identified. The esophagus is grossly unremarkable. Right pectoral Port-A-Cath with tip in the region of the right atrium noted. Lungs/Pleura: There is a moderate right pleural effusion with associated partial compressive atelectasis of the right lower lobe as well as areas of atelectatic changes in the right upper lobe. There has been interval increase in the size of the pleural effusion and associated atelectasis since the prior CT. Linear left lung base atelectasis/scarring as well as a small left pleural effusion noted. There is no pneumothorax. The central airways are patent. Upper Abdomen: Ill-defined  partially exophytic 3.2 x 2.7 cm lesion from the posterior right lobe of the liver as seen previously. There is a 4 mm right renal upper pole calculus. Musculoskeletal: Surgical clips in the right axilla and right mastectomy. Interval resolution of the right axillary fluid/seroma. Biopsy clip in the left breast. No acute osseous pathology. Review of the MIP images confirms the above findings. IMPRESSION: 1. No CT evidence of central pulmonary artery embolus. 2. Interval increase in the size of the right pleural effusion with associated partial compressive atelectasis of the right lower lobe. 3. Interval increase in the size of the right cardiophrenic angle soft  tissue mass or loculated collection with infiltration of the anterior chest wall. This may represent a loculated malignant effusion versus a soft tissue metastasis. This can be better evaluated with ultrasound or MRI. 4. Ill-defined partially exophytic lesion from the posterior right lobe of the liver as seen on the prior CT. 5. Interval resolution of the right axillary fluid/seroma. Electronically Signed   By: Anner Crete M.D.   On: 04/11/2019 22:08   Ct Angio Chest Pe W Or Wo Contrast  Result Date: 03/26/2019 CLINICAL DATA:  Recent mastectomy for breast carcinoma. Shortness of breath. EXAM: CT ANGIOGRAPHY CHEST WITH CONTRAST TECHNIQUE: Multidetector CT imaging of the chest was performed using the standard protocol during bolus administration of intravenous contrast. Multiplanar CT image reconstructions and MIPs were obtained to evaluate the vascular anatomy. CONTRAST:  68mL ISOVUE-370 IOPAMIDOL (ISOVUE-370) INJECTION 76% COMPARISON:  Chest CT November 15, 2018 FINDINGS: Cardiovascular: There is no demonstrable pulmonary embolus. There is no thoracic aortic aneurysm or dissection. The visualized great vessels appear unremarkable. Port-A-Cath tip is in the superior vena cava. There is no pericardial effusion or pericardial thickening evident. Mediastinum/Nodes: Thyroid appears unremarkable. There is no appreciable thoracic adenopathy. No esophageal lesions are evident. Lungs/Pleura: There is a fairly small pleural effusion on the right with a rather minimal pleural effusion on the left. There is consolidation with atelectasis in the posterior segments of each lower lobe. There is also mild consolidation in the inferior lingula. No pneumothorax. No pulmonary nodular lesions evident. Upper Abdomen: There is a mass arising eccentrically from the liver measuring 4.1 x 2.4 cm, an apparent metastatic lesion. Visualized upper abdominal structures otherwise appear unremarkable. Musculoskeletal: No blastic or lytic  bone lesions are evident. There is postoperative change in the right breast at the site of recent mastectomy. There is a fluid collection within the right chest region anterior to the muscles in the area of mastectomy measuring 2.4 x 2.2 cm. This structure represents either a seroma or liquefying hematoma. Review of the MIP images confirms the above findings. IMPRESSION: 1. No demonstrable pulmonary embolus. No thoracic aortic aneurysm or dissection. 2. Small pleural effusion on the right with rather minimal pleural effusion on the left. Consolidation noted in both posterior lung bases as well as in the inferior lingula. Suspect atelectasis with likely superimposed pneumonia in these areas. 3.  No thoracic adenopathy. 4. Mass arising eccentrically from the right lobe of the liver posteriorly, felt to represent a metastatic focus, also present on most recent CT. 5. Seroma versus liquefying hematoma and postoperative region right breast measuring 2.4 x 2.2 cm. Electronically Signed   By: Lowella Grip III M.D.   On: 03/26/2019 13:57   Dg Chest Port 1 View  Result Date: 04/13/2019 CLINICAL DATA:  48 year old female with decreased breath sounds. EXAM: PORTABLE CHEST 1 VIEW COMPARISON:  Chest radiograph dated 04/12/2019 FINDINGS: Right-sided Port-A-Cath in  similar position. There is shallow inspiration with bibasilar atelectasis. Infiltrate is not excluded. Overall no significant interval change since the earlier radiograph. Probable small bilateral pleural effusions there is no pneumothorax. Stable cardiac silhouette. No acute osseous pathology. Right axillary surgical clips. IMPRESSION: Shallow inspiration with small bilateral pleural effusions and bibasilar atelectasis. Overall similar or slightly worsened since the prior radiograph. Electronically Signed   By: Anner Crete M.D.   On: 04/13/2019 01:13   Dg Chest Port 1 View  Result Date: 04/12/2019 CLINICAL DATA:  Post thoracentesis EXAM: PORTABLE  CHEST 1 VIEW COMPARISON:  04/09/2019 FINDINGS: Slight interval reduction in volume of a right-sided pleural effusion. No significant pneumothorax. Otherwise unchanged examination with cardiomegaly and bandlike scarring or atelectasis of the left lung base. IMPRESSION: Slight interval reduction in volume of a right-sided pleural effusion. No significant pneumothorax. Otherwise unchanged examination with cardiomegaly and bandlike scarring or atelectasis of the left lung base. Electronically Signed   By: Eddie Candle M.D.   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 She presents for thoracentesis. EXAM: ULTRASOUND GUIDED RIGHT THORACENTESIS MEDICATIONS: None. COMPLICATIONS: None immediate. PROCEDURE: An ultrasound guided thoracentesis was thoroughly discussed with the patient and questions answered. The benefits, risks, alternatives and complications were also discussed. The patient understands and wishes to proceed with the procedure. Written consent was obtained. Ultrasound was performed to localize and mark an adequate pocket of fluid in the right chest. The area was then prepped and draped in the normal sterile fashion. 1% Lidocaine was used for local anesthesia. Under ultrasound guidance a 6 Fr Safe-T-Centesis catheter was introduced. Thoracentesis was performed. The catheter was removed and a dressing applied. FINDINGS: A total of approximately 500 mL of bloody pleural fluid was removed. Samples were sent to the laboratory as requested by the clinical team. IMPRESSION: Successful ultrasound guided right thoracentesis yielding 500 mL of pleural fluid. Electronically Signed   By: Jacqulynn Cadet M.D.   On: 04/12/2019 13:09    ASSESSMENT: 48 y.o. Brittany Rodgers, Brittany Rodgers status post right breast lower outer quadrant biopsy 10/29/2018 for a  clinical T2 N0, stage IIB invasive ductal carcinoma, grade 3, triple negative, with an MIB-1 of 80%             (a) CT of the chest 11/15/2018 shows possible liver metastases, but liver biopsy 11/25/2018 consistent with hemangioma             (b) bone scan 11/15/2018 shows no suspicious bone lesions  (1) neoadjuvant chemotherapy consisting of doxorubicin and cyclophosphamide in dose dense fashion x4, started 11/26/2018, completed 01/07/2019, followed by weekly paclitaxel and carboplatin x12 starting 01/28/2019  (2) patient desires ovarian function preservation:              (a) Zoladex started 11/08/2018, discontinued 02/04/2019             (b) patient is status post bilateral tubal ligation  (3) status post right mastectomy with sentinel lymph node sampling 03/04/2019 for a ypTac ypN1a invasive ductal carcinoma grade 3, repeat prognostic panel again triple negative             (a) a total of 6 axillary lymph nodes removed, 2 sentinel ones, 1 positive  (4) adjuvant radiation pending  (5) genetics counseling on 11/28/2018, testing was recommended, but declined by patient.  (6) Staging studies:             (  a) chest CT scan and bone scan 11/15/2018 show a liver mass, no lung or bone lesions             (b) liver biopsy 11/25/2018 shows only a hemangioma             (c) chest CT [facility] 03/26/2019 again shows liver mass, right breast seroma  (6) pembrolizumab started 04/02/2019, repeated every 21 days  (7) right pleural effusion/right cardiophrenic angle soft tissue mass   PLAN: Brittany Rodgers  is now admitted for tachycardia and shortness of breath.  She was found to have a right pleural effusion which had enlarged from prior scans.  She also has a right cardiophrenic angle soft tissue mass versus questionable loculated collection with infiltration of the anterior chest wall.  She is status post thoracentesis and cytology is currently pending.  Gram stain on the fluid was negative and  culture is negative to date.  Pleural effusion could be malignant but will need to await the cytology.  If cytology negative, may need to consider repeat thoracentesis versus biopsy of the right cardiophrenic angle soft tissue mass.  Further recommendations for medical oncology will be made once the cytology has resulted.   LOS: 3 days   Mikey Bussing, DNP, AGPCNP-BC, AOCNP 04/14/19 Brittany Rodgers was interviewed and examined.  I reviewed the admission chest CT.  She presents with dyspnea and pain.  Her symptoms appear related to metastatic disease involving the right chest.  The cytology from the 04/12/2019 pleural fluid is pending.  I discussed the probable diagnosis of Brittany Rodgers.  We discussed treatment options including changing to a different systemic chemotherapy regimen, sensitizing chemotherapy and chest wall radiation, continuing immunotherapy, and Pleurx catheter placement.  The right-sided chest pain due to pleural disease, tumor invading the chest wall, and neuropathic pain from chest wall tumor invasion.  Recommendations: 1.  Follow-up pleural fluid cytology, place right Pleurx if a malignant effusion is confirmed 2.  Hold on planned right chest wall radiation until further discussion regarding a different systemic chemotherapy regimen, potentially to be given concurrent with radiation 3.  Continue outpatient pembrolizumab  4.  Narcotic analgesics as needed for pain

## 2019-04-14 NOTE — Progress Notes (Addendum)
   NAME:  Brittany Rodgers, MRN:  [id number], DOB:  Jul 05, 1971, LOS: 3 ADMISSION DATE:  04/11/2019, CONSULTATION DATE:  7/19  CHIEF COMPLAINT:  Effusion  Brief History   Patient with history of breast cancer (surgery this past June and currently getting chemo) consulted for evolving malignant effusion   Past Medical History  Breast cancer GERD HLD IBS  Significant Hospital Events   Admitted on 7/18 [facility] with IR on 7/18 500 cc's of sanguinous fluid  Consults:  Pulm and IR  Procedures:  [facility] as noted above  Significant Diagnostic Tests:  BF notable for LDH of 1777  Micro Data:  No growth in BF to date  Antimicrobials:  NA  Interim history/subjective:  Thinks she feels a little better   Objective   Blood pressure (Abnormal) 125/92, pulse (Abnormal) 101, temperature 97.8 F (36.6 C), temperature source Oral, resp. rate (Abnormal) 22, height 5\' 2"  (1.575 m), weight 82.2 kg, SpO2 100 %.        Intake/Output Summary (Last 24 hours) at 04/14/2019 0910 Last data filed at 04/14/2019 5188 Gross per 24 hour  Intake 840 ml  Output 4000 ml  Net -3160 ml   Filed Weights   04/11/19 2105  Weight: 82.2 kg    Examination: General: This is a 48 year old black female currently resting in bed she is in no acute distress and currently is on room air support however does desaturate with activity HEENT normocephalic atraumatic no jugular venous distention her mucous membranes are moist sclera are nonicteric Pulmonary: Diminished bases right greater than left otherwise unremarkable.  There is no accessory use on exam Cardiac: Regular rate and rhythm without murmur rub or gallop Abdomen: Soft, not tender, no organomegaly.  Positive bowel sounds.  Tolerating diet. Extremities: Warm and dry brisk capillary refill no edema Neuro: Awake oriented no focal deficits.  Affect is somewhat flat.  Resolved Hospital Problem list   Na  Assessment & Plan:   Recurrent exudative right  pleural effusion with associated chest discomfort and dyspnea.  There were some groundglass changes on CT scan with interlobar thickening raising consideration for possible associated infection as well however could just represent atelectasis.  Differential diagnosis at this point continues to be infectious versus malignant, with malignant seemingly the more likely answer -Symptomatically improved after thoracentesis which was performed on 7/18 -Portable chest x-ray personally reviewed demonstrates some worsening of right pleural fluid. Plan/recommendation Day #2 Flagyl vancomycin and cefepime.  I think we can discontinue the Flagyl Await cytology, follow-up pleural culture fluid although negative to date If cytology is positive we should involve oncology but will likely need Pleurx Repeat a.m. chest x-ray Lasix as tolerated PRN analgesia   Stage IV breast cancer currently on immunotherapy. Plan Per oncology    Erick Colace ACNP-BC [place] Pager # [phone] OR # [phone] if no answer

## 2019-04-14 NOTE — Telephone Encounter (Signed)
Left VM to confirm appt/verify info °

## 2019-04-15 ENCOUNTER — Ambulatory Visit
Admission: RE | Admit: 2019-04-15 | Discharge: 2019-04-15 | Disposition: A | Discharge status: Home / Self Care | Payer: BC Managed Care – PPO | Source: Ambulatory Visit | Attending: Radiation Oncology | Admitting: Radiation Oncology

## 2019-04-15 ENCOUNTER — Ambulatory Visit: Payer: BC Managed Care – PPO

## 2019-04-15 LAB — COMPREHENSIVE METABOLIC PANEL
ALT: 25 U/L (ref 0–44)
AST: 12 U/L — ABNORMAL LOW (ref 15–41)
Albumin: 3 g/dL — ABNORMAL LOW (ref 3.5–5.0)
Alkaline Phosphatase: 90 U/L (ref 38–126)
Anion gap: 9 (ref 5–15)
BUN: 10 mg/dL (ref 6–20)
CO2: 26 mmol/L (ref 22–32)
Calcium: 8.9 mg/dL (ref 8.9–10.3)
Chloride: 102 mmol/L (ref 98–111)
Creatinine, Ser: 0.68 mg/dL (ref 0.44–1.00)
GFR calc Af Amer: >60 mL/min (ref >60)
GFR calc non Af Amer: >60 mL/min (ref >60)
Glucose, Bld: 117 mg/dL — ABNORMAL HIGH (ref 70–99)
Potassium: 3.4 mmol/L — ABNORMAL LOW (ref 3.5–5.1)
Sodium: 137 mmol/L (ref 135–145)
Total Bilirubin: 0.2 mg/dL — ABNORMAL LOW (ref 0.3–1.2)
Total Protein: 6.4 g/dL — ABNORMAL LOW (ref 6.5–8.1)

## 2019-04-15 LAB — CBC
HCT: 31.3 % — ABNORMAL LOW (ref 36.0–46.0)
Hemoglobin: 9.1 g/dL — ABNORMAL LOW (ref 12.0–15.0)
MCH: 25.9 pg — ABNORMAL LOW (ref 26.0–34.0)
MCHC: 29.1 g/dL — ABNORMAL LOW (ref 30.0–36.0)
MCV: 89.2 fL (ref 80.0–100.0)
Platelets: 327 10*3/uL (ref 150–400)
RBC: 3.51 MIL/uL — ABNORMAL LOW (ref 3.87–5.11)
RDW: 18.6 % — ABNORMAL HIGH (ref 11.5–15.5)
WBC: 5.6 10*3/uL (ref 4.0–10.5)
nRBC: 0 % (ref 0.0–0.2)

## 2019-04-15 LAB — MAGNESIUM: Magnesium: 1.7 mg/dL (ref 1.7–2.4)

## 2019-04-15 MED ORDER — POLYETHYLENE GLYCOL 3350 17 G PO PACK
17.0000 g | PACK | Freq: Two times a day (BID) | ORAL | Status: DC
  Administered 2019-04-16 (×2): 17 g via ORAL
  Filled 2019-04-15 (×5): qty 1

## 2019-04-15 MED ORDER — POTASSIUM CHLORIDE CRYS ER 20 MEQ PO TBCR
40.0000 meq | EXTENDED_RELEASE_TABLET | Freq: Once | ORAL | Status: AC
  Administered 2019-04-15: 40 meq via ORAL
  Filled 2019-04-15: qty 2

## 2019-04-15 MED ORDER — SENNOSIDES-DOCUSATE SODIUM 8.6-50 MG PO TABS
2.0000 | ORAL_TABLET | Freq: Every day | ORAL | Status: DC
  Administered 2019-04-15 – 2019-04-16 (×2): 2 via ORAL
  Filled 2019-04-15 (×2): qty 2

## 2019-04-15 NOTE — Telephone Encounter (Signed)
Pt returned vm from [place]. No answer on FC line x4. Please return pt's call. [phone]

## 2019-04-15 NOTE — Telephone Encounter (Signed)
Spoke with patient. Per patient she just needs Dr. Sarajane Jews to extend her leave of absence and this needs to be on official letter head paper. Per patient patient leave can only be extended for one year. Patient information (employee ID and where letter needs to be faxed to is in this telephone encounter further down. )

## 2019-04-15 NOTE — Progress Notes (Signed)
PROGRESS NOTE    Brittany Rodgers  CLE:[id number] DOB: 1971/05/08 DOA: 04/11/2019 PCP: [name], MD   Brief Narrative:  Brittany Rodgers is Brittany Rodgers 48 y.o. female with medical history significant of right breast cancer status post chemotherapy and mastectomy about [place] month ago also GERD that came in with shortness of breath and tachycardia.  Patient was tachypneic in the ER.  She was sent over for work-up for possible PE.  She has some chest pain but nothing out of the ordinary.  She has shortness of breath with exertion mainly.  No fever.  No nausea vomiting or diarrhea.  Patient was evaluated with CT angiogram of the chest showing no PE but patient has extensive pleural effusion on the right which looks complex possibly loculated.  Patient is being admitted for evaluation of her pleural effusion and sinus tachycardia..  ED Course: Temperature is 98.6 blood pressure 162/97 pulse 101 respiratory 95 oxygen sats 97% room air.  Sodium 140 potassium 3.3 chloride 105 CO2 25 glucose 105 BUN 11 creatinine 0.73.  White count 9.6 hemoglobin 10.1 platelets 423.  COVID-19 testing was negative.  CT angiogram of the chest showed no PE.  There was interval increase in size of right pleural effusion with associated partial compressive atelectasis of the right lower lobe.  Also right costal phrenic angle blunting.  Patient is therefore being admitted for work-up.  Assessment & Plan:   Principal Problem:   Pleural effusion Active Problems:   GERD   Hyperlipidemia   Hypokalemia   Malignant neoplasm of lower-outer quadrant of right breast of female, estrogen receptor negative (HCC)   Port-Bartholomew Ramesh-Cath in place   S/P mastectomy  #1 Right Sided Effusion   Shortness of Breath:  Shortness of breath most likely 2/2 effusion.  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   S/p thoracentesis 7/18 Exudative fluid by lights Follow culture (ngtd x 2 days) and cytology (pending) Follow thora labs - normal pH Abx as noted  below Appreciate pulmonology and oncology recommendations Oncology recommending R pleurx if malignant effusion based on cytology  # Fever: x1 to 102 on 7/19.  Started on broad spectrum abx and recultured.  Possibly tumor fever? Will d/c flagyl.  D/c vanc today. Blood cx NGTD Pleural fluid cx NG Continue cefepime for now.  # Soft Tissue Mass at R cardiophrenic angle: discussed with rads who felt this was most c/w mass.  Discussed with oncology 7/18 (Dr. [name]) who noted pt could follow up as outpatient with [name].   [name] not available, appreciate c/s from Dr. Benay Spice   #2 Breast cancer: Status post mastectomy.  Patient currently having immunotherapy.  She is scheduled to have follow-up with radiation oncology.  Completed chemotherapy.   Oncology c/s, appreciate recommendations  #3 GERD: Continue with PPIs  #4 hypokalemia: Continue with potassium supplementation.  #5 hyperlipidemia: Continue home regimen.  DVT prophylaxis: heparin  Code Status: full  Family Communication: none at bedside Disposition Plan: pending further improvement   Consultants:   IR  Oncology over phone, [name]  Procedures:  US guided R thoracentesis  Antimicrobials:  Anti-infectives (From admission, onward)   Start     Dose/Rate Route Frequency Ordered Stop   04/14/19 2200  vancomycin (VANCOCIN) IVPB 1000 mg/200 mL premix  Status:  Discontinued     1,000 mg 200 mL/hr over 60 Minutes Intravenous Every 24 hours 04/13/19 2210 04/15/19 1608   04/13/19 2215  vancomycin (VANCOCIN) 1,750 mg in sodium chloride 0.9 % 500 mL IVPB  Status:  Discontinued     1,750 mg 250 mL/hr over 120 Minutes Intravenous  Once 04/13/19 2203 04/13/19 2209   04/13/19 2215  ceFEPIme (MAXIPIME) 2 g in sodium chloride 0.9 % 100 mL IVPB     2 g 200 mL/hr over 30 Minutes Intravenous Every 8 hours 04/13/19 2203     04/13/19 2215  vancomycin (VANCOCIN) 1,500 mg in sodium chloride 0.9 % 500 mL IVPB     1,500 mg 250  mL/hr over 120 Minutes Intravenous  Once 04/13/19 2209 04/14/19 0150   04/13/19 2200  metroNIDAZOLE (FLAGYL) tablet 500 mg  Status:  Discontinued     500 mg Oral Every 8 hours 04/13/19 2156 04/14/19 0918   04/11/19 2245  vancomycin (VANCOCIN) IVPB 1000 mg/200 mL premix     1,000 mg 200 mL/hr over 60 Minutes Intravenous  Once 04/11/19 2241 04/12/19 0003   04/11/19 2245  piperacillin-tazobactam (ZOSYN) IVPB 3.375 g     3.375 g 100 mL/hr over 30 Minutes Intravenous  Once 04/11/19 2241 04/11/19 2354     Subjective: Feels ok today  Objective: Vitals:   04/15/19 0445 04/15/19 0915 04/15/19 1401 04/15/19 1441  BP: (!) 144/80  140/89   Pulse: (!) 107  (!) 101   Resp: 18  16   Temp: 98.4 F (36.9 C)  98.4 F (36.9 C)   TempSrc: Oral  Oral   SpO2: 100% 97% 97% 97%  Weight:      Height:        Intake/Output Summary (Last 24 hours) at 04/15/2019 1654 Last data filed at 04/15/2019 1300 Gross per 24 hour  Intake 699.52 ml  Output 500 ml  Net 199.52 ml   Filed Weights   04/11/19 2105 04/14/19 2316  Weight: 82.2 kg 82.3 kg    Examination:  General: No acute distress. Appears more comfortable today. Cardiovascular: Heart sounds show Brittany Rodgers regular rate, and rhythm.  Lungs: Clear to auscultation bilaterally with good air movement. Abdomen: Soft, nontender, nondistended  Neurological: Alert and oriented 3. Moves all extremities 4. Cranial nerves II through XII grossly intact. Skin: Warm and dry. No rashes or lesions. Extremities: No clubbing or cyanosis. No edema.   Data Reviewed: I have personally reviewed following labs and imaging studies  CBC: Recent Labs  Lab 04/11/19 2028 04/11/19 2117 04/12/19 0356 04/13/19 0511 04/14/19 0150 04/15/19 0403  WBC 9.6  --  8.8 9.1 6.2 5.6  NEUTROABS 6.4  --   --   --   --   --   HGB 10.2* 10.9* 9.8* 10.5* 10.3* 9.1*  HCT 33.5* 32.0* 32.9* 34.2* 35.6* 31.3*  MCV 90.3  --  89.6 88.8 91.3 89.2  PLT 423*  --  399 377 352 505   Basic  Metabolic Panel: Recent Labs  Lab 04/11/19 2028 04/11/19 2117 04/12/19 0356 04/13/19 0511 04/14/19 0150 04/15/19 0403  NA 142 140 139 135 140 137  K 3.3* 3.3* 3.4* 3.6 3.8 3.4*  CL 105 104 103 99 104 102  CO2 25  --  24 25 25 26   GLUCOSE 105* 106* 110* 115* 115* 117*  BUN 11 7 8 18 13 10   CREATININE 0.73 0.60 0.68 0.93 0.84 0.68  CALCIUM 9.2  --  8.9 9.4 8.9 8.9  MG  --   --   --  2.0 1.7 1.7   GFR: Estimated Creatinine Clearance: 84.6 mL/min (by C-G formula based on SCr of 0.68 mg/dL). Liver Function Tests: Recent Labs  Lab 04/12/19 0356 04/13/19  8588 04/14/19 0150 04/15/19 0403  AST 11* 10* 12* 12*  ALT 30 27 28 25   ALKPHOS 93 100 97 90  BILITOT 0.4 0.6 0.4 0.2*  PROT 6.8 7.4 6.9 6.4*  ALBUMIN 3.3* 3.6 3.3* 3.0*   No results for input(s): LIPASE, AMYLASE in the last 168 hours. No results for input(s): AMMONIA in the last 168 hours. Coagulation Profile: Recent Labs  Lab 04/13/19 2016  INR 1.1   Cardiac Enzymes: No results for input(s): CKTOTAL, CKMB, CKMBINDEX, TROPONINI in the last 168 hours. BNP (last 3 results) No results for input(s): PROBNP in the last 8760 hours. HbA1C: No results for input(s): HGBA1C in the last 72 hours. CBG: No results for input(s): GLUCAP in the last 168 hours. Lipid Profile: No results for input(s): CHOL, HDL, LDLCALC, TRIG, CHOLHDL, LDLDIRECT in the last 72 hours. Thyroid Function Tests: No results for input(s): TSH, T4TOTAL, FREET4, T3FREE, THYROIDAB in the last 72 hours. Anemia Panel: No results for input(s): VITAMINB12, FOLATE, FERRITIN, TIBC, IRON, RETICCTPCT in the last 72 hours. Sepsis Labs: Recent Labs  Lab 04/11/19 2028  LATICACIDVEN 1.4    Recent Results (from the past 240 hour(s))  SARS Coronavirus 2 (CEPHEID - Performed in [facility] hospital lab), Hosp Order     Status: None   Collection Time: 04/11/19  8:28 PM   Specimen: Nasopharyngeal Swab  Result Value Ref Range Status   SARS Coronavirus 2 NEGATIVE  NEGATIVE Final    Comment: (NOTE) If result is NEGATIVE SARS-CoV-2 target nucleic acids are NOT DETECTED. The SARS-CoV-2 RNA is generally detectable in upper and lower  respiratory specimens during the acute phase of infection. The lowest  concentration of SARS-CoV-2 viral copies this assay can detect is 250  copies / mL. Brittany Rodgers negative result does not preclude SARS-CoV-2 infection  and should not be used as the sole basis for treatment or other  patient management decisions.  Brittany Rodgers negative result may occur with  improper specimen collection / handling, submission of specimen other  than nasopharyngeal swab, presence of viral mutation(s) within the  areas targeted by this assay, and inadequate number of viral copies  (<250 copies / mL). Brittany Rodgers negative result must be combined with clinical  observations, patient history, and epidemiological information. If result is POSITIVE SARS-CoV-2 target nucleic acids are DETECTED. The SARS-CoV-2 RNA is generally detectable in upper and lower  respiratory specimens dur ing the acute phase of infection.  Positive  results are indicative of active infection with SARS-CoV-2.  Clinical  correlation with patient history and other diagnostic information is  necessary to determine patient infection status.  Positive results do  not rule out bacterial infection or co-infection with other viruses. If result is PRESUMPTIVE POSTIVE SARS-CoV-2 nucleic acids MAY BE PRESENT.   Brittany Rodgers presumptive positive result was obtained on the submitted specimen  and confirmed on repeat testing.  While 2019 novel coronavirus  (SARS-CoV-2) nucleic acids may be present in the submitted sample  additional confirmatory testing may be necessary for epidemiological  and / or clinical management purposes  to differentiate between  SARS-CoV-2 and other Sarbecovirus currently known to infect humans.  If clinically indicated additional testing with an alternate test  methodology [phone]) is  advised. The SARS-CoV-2 RNA is generally  detectable in upper and lower respiratory sp ecimens during the acute  phase of infection. The expected result is Negative. Fact Sheet for Patients:  StrictlyIdeas.no Fact Sheet for Healthcare Providers: BankingDealers.co.za This test is not yet approved or cleared by the  [place] Architectural technologist and has been authorized for detection and/or diagnosis of SARS-CoV-2 by FDA under an Print production planner (EUA).  This EUA will remain in effect (meaning this test can be used) for the duration of the COVID-19 declaration under Section 564(b)(1) of the Act, 21 U.S.C. section 360bbb-3(b)(1), unless the authorization is terminated or revoked sooner. Performed at [facility], [place] [address], [place], [place] [address]   Blood culture (routine x 2)     Status: None (Preliminary result)   Collection Time: 04/11/19  8:28 PM   Specimen: BLOOD  Result Value Ref Range Status   Specimen Description   Final    BLOOD Performed at [place] [address], [place], [place] [address]    Special Requests   Final    BOTTLES DRAWN AEROBIC AND ANAEROBIC Blood Culture results may not be optimal due to an excessive volume of blood received in culture bottles Performed at [place] [address] [place], [place] [address]    Culture   Final    NO GROWTH 3 DAYS Performed at [facility], [place] [address], [place], [place] [address]    Report Status PENDING  Incomplete  Blood culture (routine x 2)     Status: None (Preliminary result)   Collection Time: 04/12/19  6:56 AM   Specimen: BLOOD LEFT HAND  Result Value Ref Range Status   Specimen Description   Final    BLOOD LEFT HAND Performed at [place] [address], [place], [place] [address]    Special Requests   Final    BOTTLES DRAWN AEROBIC ONLY Blood Culture adequate  volume Performed at [place] [address], [place], [place] [address]    Culture   Final    NO GROWTH 3 DAYS Performed at [facility], [place] [address] [place], [place] [address]    Report Status PENDING  Incomplete  Gram stain     Status: None   Collection Time: 04/12/19 12:17 PM   Specimen: Pleura  Result Value Ref Range Status   Specimen Description PLEURAL  Final   Special Requests NONE  Final   Gram Stain   Final    ABUNDANT WBC PRESENT, PREDOMINANTLY PMN NO ORGANISMS SEEN Performed at [facility], [place] [address] [place], [place] [address]    Report Status 04/13/2019 FINAL  Final  Culture, body fluid-bottle     Status: None (Preliminary result)   Collection Time: 04/12/19 12:17 PM   Specimen: Pleura  Result Value Ref Range Status   Specimen Description PLEURAL  Final   Special Requests NONE  Final   Culture   Final    NO GROWTH 3 DAYS Performed at [place] [address], [place], [place] [address]    Report Status PENDING  Incomplete  MRSA PCR Screening     Status: None   Collection Time: 04/14/19  1:19 AM   Specimen: Nasal Mucosa; Nasopharyngeal  Result Value Ref Range Status   MRSA by PCR NEGATIVE NEGATIVE Final    Comment:        The GeneXpert MRSA Assay (FDA approved for NASAL specimens only), is one component of Brittany Rodgers comprehensive MRSA colonization surveillance program. It is not intended to diagnose MRSA infection nor to guide or monitor treatment for MRSA infections. Performed at [facility], [place] [address] [place], [place] [address]   Culture, blood (routine  x 2)     Status: None (Preliminary result)   Collection Time: 04/14/19  1:50 AM   Specimen: BLOOD  Result Value Ref Range Status   Specimen Description   Final    BLOOD LEFT ANTECUBITAL Performed at [place] [address], [place], [place] [address]    Special Requests   Final    BOTTLES DRAWN  AEROBIC ONLY Blood Culture adequate volume Performed at [place] [address] [place], [place] [address]    Culture   Final    NO GROWTH < 24 HOURS Performed at [place] [address] [place], [place] [address]    Report Status PENDING  Incomplete  Culture, blood (routine x 2)     Status: None (Preliminary result)   Collection Time: 04/14/19  1:50 AM   Specimen: BLOOD  Result Value Ref Range Status   Specimen Description   Final    BLOOD BLOOD LEFT HAND Performed at [place] [address], [place], [place] [address]    Special Requests   Final    BOTTLES DRAWN AEROBIC ONLY Blood Culture adequate volume Performed at [place] [address], [place], [place] [address]    Culture   Final    NO GROWTH < 24 HOURS Performed at [place] [address] [place],  [address]    Report Status PENDING  Incomplete         Radiology Studies: Dg Chest 2 View  Result Date: 04/13/2019 CLINICAL DATA:  47 year old with RIGHT mastectomy on 03/04/2019 for breast cancer, currently being admitted due to Cabe Lashley RIGHT pleural effusion. EXAM: CHEST - 2 VIEW 5:30 p.m.: COMPARISON:  Portable chest x-ray earlier same day at 12:43 Akaylah Lalley.m. and previous chest x-rays. CT chest 04/11/2019 and earlier. FINDINGS: AP SEMI-ERECT and LATERAL images were obtained. Cardiac silhouette mildly enlarged even allowing for AP technique, unchanged. Large RIGHT pleural effusion with Tarrin Menn subpulmonic component, unchanged since the examination earlier today, increased in size since yesterday. Associated passive atelectasis in the RIGHT LOWER LOBE. Mild linear atelectasis in the LEFT LOWER LOBE as noted on the recent CT. No new pulmonary parenchymal abnormalities. RIGHT jugular Port-Maxyne Derocher-Cath tip at or near the cavoatrial junction. Post surgical changes related to RIGHT mastectomy and axillary node removal. IMPRESSION: 1. Large RIGHT pleural  effusion with Micheala Morissette subpulmonic component, increased in size since yesterday. Associated passive atelectasis in the RIGHT LOWER LOBE. 2. Mild linear atelectasis in the LEFT LOWER LOBE as noted on the recent CT. 3. No new abnormalities. Electronically Signed   By: Evangeline Dakin M.D.   On: 04/13/2019 18:11        Scheduled Meds:  atorvastatin  20 mg Oral Daily   Chlorhexidine Gluconate Cloth  6 each Topical Daily   cyclobenzaprine  10 mg Oral TID   gabapentin  300 mg Oral QHS   gabapentin  600 mg Oral BID   heparin  5,000 Units Subcutaneous Q8H   ipratropium-albuterol  3 mL Nebulization TID   metoprolol succinate  50 mg Oral Daily   pantoprazole  40 mg Oral Daily   potassium chloride  10 mEq Oral Daily   sodium chloride flush  10-40 mL Intracatheter Q12H   Continuous Infusions:  ceFEPime (MAXIPIME) IV 2 g (04/15/19 1457)     LOS: 4 days    Time spent: over 30 min    Fayrene Helper, MD [facility] Pager AMION  If 7PM-7AM,  please contact night-coverage [email or web address] Password [facility] 04/15/2019, 4:54 PM

## 2019-04-15 NOTE — Telephone Encounter (Signed)
Called patient. No answer. LVM for patient to return call

## 2019-04-15 NOTE — Plan of Care (Signed)
  Problem: Education: Goal: Knowledge of General Education information will improve Description: Including pain rating scale, medication(s)/side effects and non-pharmacologic comfort measures Outcome: Progressing   Problem: Activity: Goal: Risk for activity intolerance will decrease Outcome: Progressing   Problem: Pain Managment: Goal: General experience of comfort will improve Outcome: Progressing   

## 2019-04-15 NOTE — Progress Notes (Signed)
error 

## 2019-04-15 NOTE — Progress Notes (Signed)
Physical Therapy Treatment Patient Details Name: Brittany Rodgers MRN: [id number] DOB: 08/26/1971 Today's Date: 04/15/2019    History of Present Illness 48 yo female admitted with pleural effusion. Hx of breat cancer s/p chemo, mastectomy    PT Comments    Progressing with mobility. Improved pain control compared to last session. Still becomes dyspneic with activity. Will continue to follow. Recommend daily ambulation in hallway with nursing supervision to increase activity.   Follow Up Recommendations  Home health PT;Supervision/Assistance - 24 hour     Equipment Recommendations  Rolling walker with 5" wheels    Recommendations for Other Services       Precautions / Restrictions Precautions Precautions: Fall Restrictions Weight Bearing Restrictions: No Other Position/Activity Restrictions: WBAT    Mobility  Bed Mobility   Bed Mobility: Supine to Sit;Sit to Supine     Supine to sit: Supervision;HOB elevated Sit to supine: Supervision;HOB elevated   General bed mobility comments: for lines  Transfers Overall transfer level: Needs assistance   Transfers: Sit to/from Stand Sit to Stand: Min assist         General transfer comment: close guard for safety. increased time.  Ambulation/Gait Ambulation/Gait assistance: Min assist Gait Distance (Feet): 40 Feet(x2) Assistive device: IV Pole Gait Pattern/deviations: Step-through pattern;Decreased stride length     General Gait Details: slow gait speed. dyspnea 2/4. seated rest break taken.   Stairs             Wheelchair Mobility    Modified Rankin (Stroke Patients Only)       Balance Overall balance assessment: Needs assistance           Standing balance-Leahy Scale: Fair                              Cognition Arousal/Alertness: Awake/alert Behavior During Therapy: WFL for tasks assessed/performed Overall Cognitive Status: Within Functional Limits for tasks assessed                                  General Comments: slow to respond at times      Exercises      General Comments        Pertinent Vitals/Pain Pain Assessment: Faces Faces Pain Scale: Hurts little more Pain Intervention(s): Monitored during session    Home Living                      Prior Function            PT Goals (current goals can now be found in the care plan section) Progress towards PT goals: Progressing toward goals    Frequency    Min 3X/week      PT Plan Discharge plan needs to be updated    Co-evaluation              AM-PAC PT "6 Clicks" Mobility   Outcome Measure  Help needed turning from your back to your side while in a flat bed without using bedrails?: A Little Help needed moving from lying on your back to sitting on the side of a flat bed without using bedrails?: A Little Help needed moving to and from a bed to a chair (including a wheelchair)?: A Little Help needed standing up from a chair using your arms (e.g., wheelchair or bedside chair)?: A Little Help needed to walk in hospital  room?: A Little Help needed climbing 3-5 steps with a railing? : A Little 6 Click Score: 18    End of Session Equipment Utilized During Treatment: Gait belt Activity Tolerance: Patient tolerated treatment well Patient left: in bed;with call bell/phone within reach   PT Visit Diagnosis: Pain;Muscle weakness (generalized) (M62.81);Difficulty in walking, not elsewhere classified (R26.2)     Time: 3903-0092 PT Time Calculation (min) (ACUTE ONLY): 13 min  Charges:  $Gait Training: 8-22 mins                        Weston Anna, PT Acute Rehabilitation Services Pager: [phone] Office: [phone]

## 2019-04-15 NOTE — Progress Notes (Addendum)
HEMATOLOGY-ONCOLOGY PROGRESS NOTE  SUBJECTIVE: More comfortable this morning.  Has been started on a muscle relaxer in addition to gabapentin.  Also has hydromorphone as needed.  Shortness of breath is stable.  Denies abdominal pain, nausea, vomiting.  Cytology on pleural fluid is pending.  Oncology History  Malignant neoplasm of lower-outer quadrant of right breast of female, estrogen receptor negative ([place])  11/04/2018 Initial Diagnosis   Malignant neoplasm of lower-outer quadrant of right breast of female, estrogen receptor negative ([place])   11/26/2018 -  Chemotherapy   The patient had DOXOrubicin (ADRIAMYCIN) chemo injection 106 mg, 60 mg/m2 = 106 mg, Intravenous,  Once, 4 of 4 cycles Administration: 106 mg (11/26/2018), 106 mg (12/10/2018), 106 mg (12/24/2018), 106 mg (01/07/2019) palonosetron (ALOXI) injection 0.25 mg, 0.25 mg, Intravenous,  Once, 6 of 10 cycles Administration: 0.25 mg (11/26/2018), 0.25 mg (01/28/2019), 0.25 mg (12/10/2018), 0.25 mg (12/24/2018), 0.25 mg (01/07/2019), 0.25 mg (02/11/2019), 0.25 mg (02/04/2019), 0.25 mg (02/18/2019) pegfilgrastim-cbqv (UDENYCA) injection 6 mg, 6 mg, Subcutaneous, Once, 4 of 4 cycles Administration: 6 mg (12/12/2018), 6 mg (12/26/2018), 6 mg (01/09/2019) CARBOplatin (PARAPLATIN) 240 mg in sodium chloride 0.9 % 250 mL chemo infusion, 240 mg (99 % of original dose 242.4 mg), Intravenous,  Once, 2 of 6 cycles Dose modification:   (original dose 242.4 mg, Cycle 5), 240 mg (original dose 242.4 mg, Cycle 5),   (original dose 242.4 mg, Cycle 6, Reason: Provider Judgment) Administration: 240 mg (01/28/2019), 240 mg (02/11/2019), 240 mg (02/04/2019), 240 mg (02/18/2019) cyclophosphamide (CYTOXAN) 1,060 mg in sodium chloride 0.9 % 250 mL chemo infusion, 600 mg/m2 = 1,060 mg, Intravenous,  Once, 4 of 4 cycles Administration: 1,060 mg (11/26/2018), 1,060 mg (12/10/2018), 1,060 mg (12/24/2018), 1,060 mg (01/07/2019) PACLitaxel (TAXOL) 144 mg in sodium chloride 0.9 % 250 mL chemo  infusion (</= 80mg /m2), 80 mg/m2 = 144 mg, Intravenous,  Once, 2 of 6 cycles Administration: 144 mg (01/28/2019), 144 mg (02/11/2019), 144 mg (02/04/2019), 144 mg (02/18/2019) fosaprepitant (EMEND) 150 mg, dexamethasone (DECADRON) 12 mg in sodium chloride 0.9 % 145 mL IVPB, , Intravenous,  Once, 5 of 5 cycles Administration:  (11/26/2018),  (01/28/2019),  (12/10/2018),  (12/24/2018),  (01/07/2019)  for chemotherapy treatment.    04/02/2019 -  Chemotherapy   The patient had pembrolizumab (KEYTRUDA) 200 mg in sodium chloride 0.9 % 50 mL chemo infusion, 200 mg, Intravenous, Once, 1 of 6 cycles Administration: 200 mg (04/02/2019)  for chemotherapy treatment.       REVIEW OF SYSTEMS:   Constitutional: Low-grade fever up to 99.7 last evening. Eyes: Denies blurriness of vision Ears, nose, mouth, throat, and face: Denies mucositis or sore throat Respiratory: Denies cough.  Shortness of breath stable. Cardiovascular: Reports right sided chest/posterior rib pain that is improved Gastrointestinal:  Denies nausea, heartburn or change in bowel habits Skin: Denies abnormal skin rashes Lymphatics: Denies new lymphadenopathy or easy bruising Neurological:Denies numbness, tingling or new weaknesses Behavioral/Psych: Mood is stable, no new changes  Extremities: No lower extremity edema All other systems were reviewed with the patient and are negative.  I have reviewed the past medical history, past surgical history, social history and family history with the patient and they are unchanged from previous note.   PHYSICAL EXAMINATION:  Vitals:   04/15/19 0445 04/15/19 0915  BP: (!) 144/80   Pulse: (!) 107   Resp: 18   Temp: 98.4 F (36.9 C)   SpO2: 100% 97%   Filed Weights   04/11/19 2105 04/14/19 2316  Weight: 181 lb  3.2 oz (82.2 kg) 181 lb 6.4 oz (82.3 kg)    Intake/Output from previous day: 07/20 0701 - 07/21 0700 In: 939.5 [P.O.:720; IV Piggyback:219.5] Out: 1500 [Urine:1500]  GENERAL:alert, no  distress and comfortable SKIN: skin color, texture, turgor are normal, no rashes or significant lesions OROPHARYNX: No thrush or mucositis LYMPH:  no palpable lymphadenopathy in the cervical, axillary or inguinal LUNGS: Diminished to the right base otherwise clear HEART: Tachycardia, no murmurs and no lower extremity edema ABDOMEN:abdomen soft, non-tender and normal bowel sounds Musculoskeletal:no cyanosis of digits and no clubbing  NEURO: alert & oriented x 3 with fluent speech, no focal motor/sensory deficits  LABORATORY DATA:  I have reviewed the data as listed CMP Latest Ref Rng & Units 04/15/2019 04/14/2019 04/13/2019  Glucose 70 - 99 mg/dL 117(H) 115(H) 115(H)  BUN 6 - 20 mg/dL 10 13 18   Creatinine 0.44 - 1.00 mg/dL 0.68 0.84 0.93  Sodium 135 - 145 mmol/L 137 140 135  Potassium 3.5 - 5.1 mmol/L 3.4(L) 3.8 3.6  Chloride 98 - 111 mmol/L 102 104 99  CO2 22 - 32 mmol/L 26 25 25   Calcium 8.9 - 10.3 mg/dL 8.9 8.9 9.4  Total Protein 6.5 - 8.1 g/dL 6.4(L) 6.9 7.4  Total Bilirubin 0.3 - 1.2 mg/dL 0.2(L) 0.4 0.6  Alkaline Phos 38 - 126 U/L 90 97 100  AST 15 - 41 U/L 12(L) 12(L) 10(L)  ALT 0 - 44 U/L 25 28 27     Lab Results  Component Value Date   WBC 5.6 04/15/2019   HGB 9.1 (L) 04/15/2019   HCT 31.3 (L) 04/15/2019   MCV 89.2 04/15/2019   PLT 327 04/15/2019   NEUTROABS 6.4 04/11/2019    Dg Chest 2 View  Result Date: 04/13/2019 CLINICAL DATA:  48 year old with RIGHT mastectomy on 03/04/2019 for breast cancer, currently being admitted due to a RIGHT pleural effusion. EXAM: CHEST - 2 VIEW 5:30 p.m.: COMPARISON:  Portable chest x-ray earlier same day at 12:43 a.m. and previous chest x-rays. CT chest 04/11/2019 and earlier. FINDINGS: AP SEMI-ERECT and LATERAL images were obtained. Cardiac silhouette mildly enlarged even allowing for AP technique, unchanged. Large RIGHT pleural effusion with a subpulmonic component, unchanged since the examination earlier today, increased in size since  yesterday. Associated passive atelectasis in the RIGHT LOWER LOBE. Mild linear atelectasis in the LEFT LOWER LOBE as noted on the recent CT. No new pulmonary parenchymal abnormalities. RIGHT jugular Port-A-Cath tip at or near the cavoatrial junction. Post surgical changes related to RIGHT mastectomy and axillary node removal. IMPRESSION: 1. Large RIGHT pleural effusion with a subpulmonic component, increased in size since yesterday. Associated passive atelectasis in the RIGHT LOWER LOBE. 2. Mild linear atelectasis in the LEFT LOWER LOBE as noted on the recent CT. 3. No new abnormalities. Electronically Signed   By: Evangeline Dakin M.D.   On: 04/13/2019 18:11   Dg Chest 2 View  Result Date: 04/10/2019 CLINICAL DATA:  Shortness of breath. Right breast carcinoma. Gastroesophageal reflux disease. EXAM: CHEST - 2 VIEW COMPARISON:  12/06/2018 FINDINGS: Heart size is normal. Right-sided power port remains in place. New infiltrate or atelectasis is seen in both lung bases, right side greater than left. No evidence of pleural effusion. IMPRESSION: New bibasilar atelectasis versus infiltrates, right side greater than left. Electronically Signed   By: Marlaine Hind M.D.   On: 04/10/2019 08:11   Ct Angio Chest Pe W And/or Wo Contrast  Result Date: 04/11/2019 CLINICAL DATA:  48 year old female with history of  right breast cancer and recent mastectomy presenting with shortness of breath. EXAM: CT ANGIOGRAPHY CHEST WITH CONTRAST TECHNIQUE: Multidetector CT imaging of the chest was performed using the standard protocol during bolus administration of intravenous contrast. Multiplanar CT image reconstructions and MIPs were obtained to evaluate the vascular anatomy. CONTRAST:  18mL OMNIPAQUE IOHEXOL 350 MG/ML SOLN COMPARISON:  Chest CT dated 03/26/2019 and radiograph dated 04/09/2019 and CT dated 11/15/2018 FINDINGS: Cardiovascular: Borderline cardiomegaly. No pericardial effusion. The thoracic aorta is unremarkable. The  origins of the great vessels of the aortic arch are patent as visualized. Evaluation of the pulmonary arteries is limited due to respiratory motion artifact and suboptimal visualization of the peripheral branches. No large or central pulmonary artery embolus identified. Mediastinum/Nodes: There is a 6.7 x 5.6 cm soft tissue appearing mass in the right cardiophrenic angle with infiltration of the anterior chest wall and extension into the subpectoral region. This is new compared to the CT of 11/15/2018 and has increased in size since the study of 03/26/2019 where it measured approximately 4.0 x 3.7 cm by my measurements. This may represent a loculated malignant effusion versus a soft tissue metastasis. Although, the lobulated border and infiltration into the anterior chest wall suggest metastasis, significant interval increase in size over the course of 2 weeks is somewhat unusual. This can be better evaluated with ultrasound or MRI. No definite mediastinal or hilar adenopathy identified. The esophagus is grossly unremarkable. Right pectoral Port-A-Cath with tip in the region of the right atrium noted. Lungs/Pleura: There is a moderate right pleural effusion with associated partial compressive atelectasis of the right lower lobe as well as areas of atelectatic changes in the right upper lobe. There has been interval increase in the size of the pleural effusion and associated atelectasis since the prior CT. Linear left lung base atelectasis/scarring as well as a small left pleural effusion noted. There is no pneumothorax. The central airways are patent. Upper Abdomen: Ill-defined partially exophytic 3.2 x 2.7 cm lesion from the posterior right lobe of the liver as seen previously. There is a 4 mm right renal upper pole calculus. Musculoskeletal: Surgical clips in the right axilla and right mastectomy. Interval resolution of the right axillary fluid/seroma. Biopsy clip in the left breast. No acute osseous pathology.  Review of the MIP images confirms the above findings. IMPRESSION: 1. No CT evidence of central pulmonary artery embolus. 2. Interval increase in the size of the right pleural effusion with associated partial compressive atelectasis of the right lower lobe. 3. Interval increase in the size of the right cardiophrenic angle soft tissue mass or loculated collection with infiltration of the anterior chest wall. This may represent a loculated malignant effusion versus a soft tissue metastasis. This can be better evaluated with ultrasound or MRI. 4. Ill-defined partially exophytic lesion from the posterior right lobe of the liver as seen on the prior CT. 5. Interval resolution of the right axillary fluid/seroma. Electronically Signed   By: Anner Crete M.D.   On: 04/11/2019 22:08   Ct Angio Chest Pe W Or Wo Contrast  Result Date: 03/26/2019 CLINICAL DATA:  Recent mastectomy for breast carcinoma. Shortness of breath. EXAM: CT ANGIOGRAPHY CHEST WITH CONTRAST TECHNIQUE: Multidetector CT imaging of the chest was performed using the standard protocol during bolus administration of intravenous contrast. Multiplanar CT image reconstructions and MIPs were obtained to evaluate the vascular anatomy. CONTRAST:  54mL ISOVUE-370 IOPAMIDOL (ISOVUE-370) INJECTION 76% COMPARISON:  Chest CT November 15, 2018 FINDINGS: Cardiovascular: There is no demonstrable pulmonary  embolus. There is no thoracic aortic aneurysm or dissection. The visualized great vessels appear unremarkable. Port-A-Cath tip is in the superior vena cava. There is no pericardial effusion or pericardial thickening evident. Mediastinum/Nodes: Thyroid appears unremarkable. There is no appreciable thoracic adenopathy. No esophageal lesions are evident. Lungs/Pleura: There is a fairly small pleural effusion on the right with a rather minimal pleural effusion on the left. There is consolidation with atelectasis in the posterior segments of each lower lobe. There is also  mild consolidation in the inferior lingula. No pneumothorax. No pulmonary nodular lesions evident. Upper Abdomen: There is a mass arising eccentrically from the liver measuring 4.1 x 2.4 cm, an apparent metastatic lesion. Visualized upper abdominal structures otherwise appear unremarkable. Musculoskeletal: No blastic or lytic bone lesions are evident. There is postoperative change in the right breast at the site of recent mastectomy. There is a fluid collection within the right chest region anterior to the muscles in the area of mastectomy measuring 2.4 x 2.2 cm. This structure represents either a seroma or liquefying hematoma. Review of the MIP images confirms the above findings. IMPRESSION: 1. No demonstrable pulmonary embolus. No thoracic aortic aneurysm or dissection. 2. Small pleural effusion on the right with rather minimal pleural effusion on the left. Consolidation noted in both posterior lung bases as well as in the inferior lingula. Suspect atelectasis with likely superimposed pneumonia in these areas. 3.  No thoracic adenopathy. 4. Mass arising eccentrically from the right lobe of the liver posteriorly, felt to represent a metastatic focus, also present on most recent CT. 5. Seroma versus liquefying hematoma and postoperative region right breast measuring 2.4 x 2.2 cm. Electronically Signed   By: Lowella Grip III M.D.   On: 03/26/2019 13:57   Dg Chest Port 1 View  Result Date: 04/13/2019 CLINICAL DATA:  48 year old female with decreased breath sounds. EXAM: PORTABLE CHEST 1 VIEW COMPARISON:  Chest radiograph dated 04/12/2019 FINDINGS: Right-sided Port-A-Cath in similar position. There is shallow inspiration with bibasilar atelectasis. Infiltrate is not excluded. Overall no significant interval change since the earlier radiograph. Probable small bilateral pleural effusions there is no pneumothorax. Stable cardiac silhouette. No acute osseous pathology. Right axillary surgical clips. IMPRESSION:  Shallow inspiration with small bilateral pleural effusions and bibasilar atelectasis. Overall similar or slightly worsened since the prior radiograph. Electronically Signed   By: Anner Crete M.D.   On: 04/13/2019 01:13   Dg Chest Port 1 View  Result Date: 04/12/2019 CLINICAL DATA:  Post thoracentesis EXAM: PORTABLE CHEST 1 VIEW COMPARISON:  04/09/2019 FINDINGS: Slight interval reduction in volume of a right-sided pleural effusion. No significant pneumothorax. Otherwise unchanged examination with cardiomegaly and bandlike scarring or atelectasis of the left lung base. IMPRESSION: Slight interval reduction in volume of a right-sided pleural effusion. No significant pneumothorax. Otherwise unchanged examination with cardiomegaly and bandlike scarring or atelectasis of the left lung base. Electronically Signed   By: Eddie Candle M.D.   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 She presents for thoracentesis. EXAM: ULTRASOUND GUIDED RIGHT THORACENTESIS MEDICATIONS: None. COMPLICATIONS: None immediate. PROCEDURE: An ultrasound guided thoracentesis was thoroughly discussed with the patient and questions answered. The benefits, risks, alternatives and complications were also discussed. The patient understands and wishes to proceed with the procedure. Written consent was obtained. Ultrasound was performed to localize and mark an adequate pocket of fluid in the right  chest. The area was then prepped and draped in the normal sterile fashion. 1% Lidocaine was used for local anesthesia. Under ultrasound guidance a 6 Fr Safe-T-Centesis catheter was introduced. Thoracentesis was performed. The catheter was removed and a dressing applied. FINDINGS: A total of approximately 500 mL of bloody pleural fluid was removed. Samples were sent to the  laboratory as requested by the clinical team. IMPRESSION: Successful ultrasound guided right thoracentesis yielding 500 mL of pleural fluid. Electronically Signed   By: Jacqulynn Cadet M.D.   On: 04/12/2019 13:09    ASSESSMENT: 48 y.o. [place] Parkins, [place] woman status post right breast lower outer quadrant biopsy 10/29/2018 for a clinical T2 N0, stage IIB invasive ductal carcinoma, grade 3, triple negative, with an MIB-1 of 80%             (a) CT of the chest 11/15/2018 shows possible liver metastases, but liver biopsy 11/25/2018 consistent with hemangioma             (b) bone scan 11/15/2018 shows no suspicious bone lesions  (1) neoadjuvant chemotherapy consisting of doxorubicin and cyclophosphamide in dose dense fashion x4, started 11/26/2018, completed 01/07/2019, followed by weekly paclitaxel and carboplatin x12 starting 01/28/2019  (2) patient desires ovarian function preservation:              (a) Zoladex started 11/08/2018, discontinued 02/04/2019             (b) patient is status post bilateral tubal ligation  (3) status post right mastectomy with sentinel lymph node sampling 03/04/2019 for a ypTac ypN1a invasive ductal carcinoma grade 3, repeat prognostic panel again triple negative             (a) a total of 6 axillary lymph nodes removed, 2 sentinel ones, 1 positive  (4) adjuvant radiation pending  (5) genetics counseling on 11/28/2018, testing was recommended, but declined by patient.  (6) Staging studies:             (a) chest CT scan and bone scan 11/15/2018 show a liver mass, no lung or bone lesions             (b) liver biopsy 11/25/2018 shows only a hemangioma             (c) chest CT Angie 03/26/2019 again shows liver mass, right breast seroma  (6) pembrolizumab started 04/02/2019, repeated every 21 days  (7) right pleural effusion/right cardiophrenic angle soft tissue mass   PLAN: Ms. Kosiba appears improved.   Pain is overall better controlled.  Her  tachycardia persist and shortness of breath is stable. She was found to have a right pleural effusion which had enlarged from prior scans.  She also has a right cardiophrenic angle soft tissue mass versus questionable loculated collection with infiltration of the anterior chest wall.  She is status post thoracentesis and cytology is currently pending.    1.  Follow-up pleural fluid cytology.  May need right Pleurx if malignant effusion confirmed 2.  Radiation oncology has been notified of the patient's admission.  We will hold off on right chest wall radiation pending results of cytology. 3.  We will plan to continue outpatient pembrolizumab. 4.  Continue gabapentin, as needed hydromorphone, and Flexeril.   LOS: 4 days   Mikey Bussing, DNP, AGPCNP-BC, AOCNP 04/15/19 Ms. Dimitrov appears stable.  The cytology from the pleural fluid is still pending.  I recommend placement of a right Pleurx if she has a malignant effusion.  The fever may be  related to "tumor "fever. We will arrange for outpatient follow-up with [name] with the plan to continue pembrolizumab for now.

## 2019-04-15 NOTE — Telephone Encounter (Signed)
Returned patient call. No answer. LVM

## 2019-04-16 ENCOUNTER — Inpatient Hospital Stay (HOSPITAL_COMMUNITY): Payer: BC Managed Care – PPO

## 2019-04-16 DIAGNOSIS — C50511 Malignant neoplasm of lower-outer quadrant of right female breast: Principal | ICD-10-CM

## 2019-04-16 DIAGNOSIS — Z171 Estrogen receptor negative status [ER-]: Secondary | ICD-10-CM

## 2019-04-16 DIAGNOSIS — Z9011 Acquired absence of right breast and nipple: Secondary | ICD-10-CM

## 2019-04-16 LAB — COMPREHENSIVE METABOLIC PANEL
ALT: 25 U/L (ref 0–44)
AST: 12 U/L — ABNORMAL LOW (ref 15–41)
Albumin: 3.1 g/dL — ABNORMAL LOW (ref 3.5–5.0)
Alkaline Phosphatase: 95 U/L (ref 38–126)
Anion gap: 8 (ref 5–15)
BUN: 10 mg/dL (ref 6–20)
CO2: 26 mmol/L (ref 22–32)
Calcium: 9.1 mg/dL (ref 8.9–10.3)
Chloride: 105 mmol/L (ref 98–111)
Creatinine, Ser: 0.64 mg/dL (ref 0.44–1.00)
GFR calc Af Amer: >60 mL/min (ref >60)
GFR calc non Af Amer: >60 mL/min (ref >60)
Glucose, Bld: 115 mg/dL — ABNORMAL HIGH (ref 70–99)
Potassium: 3.8 mmol/L (ref 3.5–5.1)
Sodium: 139 mmol/L (ref 135–145)
Total Bilirubin: 0.4 mg/dL (ref 0.3–1.2)
Total Protein: 6.5 g/dL (ref 6.5–8.1)

## 2019-04-16 LAB — CBC
HCT: 30.8 % — ABNORMAL LOW (ref 36.0–46.0)
Hemoglobin: 9 g/dL — ABNORMAL LOW (ref 12.0–15.0)
MCH: 26.4 pg (ref 26.0–34.0)
MCHC: 29.2 g/dL — ABNORMAL LOW (ref 30.0–36.0)
MCV: 90.3 fL (ref 80.0–100.0)
Platelets: 360 10*3/uL (ref 150–400)
RBC: 3.41 MIL/uL — ABNORMAL LOW (ref 3.87–5.11)
RDW: 18.4 % — ABNORMAL HIGH (ref 11.5–15.5)
WBC: 5.7 10*3/uL (ref 4.0–10.5)
nRBC: 0 % (ref 0.0–0.2)

## 2019-04-16 LAB — MAGNESIUM: Magnesium: 1.9 mg/dL (ref 1.7–2.4)

## 2019-04-16 MED ORDER — SENNOSIDES-DOCUSATE SODIUM 8.6-50 MG PO TABS
2.0000 | ORAL_TABLET | Freq: Two times a day (BID) | ORAL | Status: DC
  Administered 2019-04-17 – 2019-04-18 (×3): 2 via ORAL
  Filled 2019-04-16 (×3): qty 2

## 2019-04-16 MED ORDER — IPRATROPIUM-ALBUTEROL 0.5-2.5 (3) MG/3ML IN SOLN
3.0000 mL | Freq: Two times a day (BID) | RESPIRATORY_TRACT | Status: DC
  Administered 2019-04-17 – 2019-04-18 (×3): 3 mL via RESPIRATORY_TRACT
  Filled 2019-04-16 (×3): qty 3

## 2019-04-16 MED ORDER — BISACODYL 10 MG RE SUPP: 10.0000 mg | Freq: Every day | RECTAL | Status: DC

## 2019-04-16 NOTE — Progress Notes (Addendum)
HEMATOLOGY-ONCOLOGY PROGRESS NOTE  SUBJECTIVE: Sleeping quietly this morning.  Nurse states that she just gave her oxycodone.  She woke up briefly to speak to me and reports that her pain is overall better controlled.  Shortness of breath is about the same.  She has no other complaints this morning.  Oncology History  Malignant neoplasm of lower-outer quadrant of right breast of female, estrogen receptor negative ([place])  11/04/2018 Initial Diagnosis   Malignant neoplasm of lower-outer quadrant of right breast of female, estrogen receptor negative ([place])   11/26/2018 -  Chemotherapy   The patient had DOXOrubicin (ADRIAMYCIN) chemo injection 106 mg, 60 mg/m2 = 106 mg, Intravenous,  Once, 4 of 4 cycles Administration: 106 mg (11/26/2018), 106 mg (12/10/2018), 106 mg (12/24/2018), 106 mg (01/07/2019) palonosetron (ALOXI) injection 0.25 mg, 0.25 mg, Intravenous,  Once, 6 of 10 cycles Administration: 0.25 mg (11/26/2018), 0.25 mg (01/28/2019), 0.25 mg (12/10/2018), 0.25 mg (12/24/2018), 0.25 mg (01/07/2019), 0.25 mg (02/11/2019), 0.25 mg (02/04/2019), 0.25 mg (02/18/2019) pegfilgrastim-cbqv (UDENYCA) injection 6 mg, 6 mg, Subcutaneous, Once, 4 of 4 cycles Administration: 6 mg (12/12/2018), 6 mg (12/26/2018), 6 mg (01/09/2019) CARBOplatin (PARAPLATIN) 240 mg in sodium chloride 0.9 % 250 mL chemo infusion, 240 mg (99 % of original dose 242.4 mg), Intravenous,  Once, 2 of 6 cycles Dose modification:   (original dose 242.4 mg, Cycle 5), 240 mg (original dose 242.4 mg, Cycle 5),   (original dose 242.4 mg, Cycle 6, Reason: Provider Judgment) Administration: 240 mg (01/28/2019), 240 mg (02/11/2019), 240 mg (02/04/2019), 240 mg (02/18/2019) cyclophosphamide (CYTOXAN) 1,060 mg in sodium chloride 0.9 % 250 mL chemo infusion, 600 mg/m2 = 1,060 mg, Intravenous,  Once, 4 of 4 cycles Administration: 1,060 mg (11/26/2018), 1,060 mg (12/10/2018), 1,060 mg (12/24/2018), 1,060 mg (01/07/2019) PACLitaxel (TAXOL) 144 mg in sodium chloride 0.9 % 250 mL  chemo infusion (</= 80mg /m2), 80 mg/m2 = 144 mg, Intravenous,  Once, 2 of 6 cycles Administration: 144 mg (01/28/2019), 144 mg (02/11/2019), 144 mg (02/04/2019), 144 mg (02/18/2019) fosaprepitant (EMEND) 150 mg, dexamethasone (DECADRON) 12 mg in sodium chloride 0.9 % 145 mL IVPB, , Intravenous,  Once, 5 of 5 cycles Administration:  (11/26/2018),  (01/28/2019),  (12/10/2018),  (12/24/2018),  (01/07/2019)  for chemotherapy treatment.    04/02/2019 -  Chemotherapy   The patient had pembrolizumab (KEYTRUDA) 200 mg in sodium chloride 0.9 % 50 mL chemo infusion, 200 mg, Intravenous, Once, 1 of 6 cycles Administration: 200 mg (04/02/2019)  for chemotherapy treatment.       REVIEW OF SYSTEMS:   Constitutional: No fevers or chills within the past 24 hours. Respiratory: Denies cough.  Shortness of breath stable. Cardiovascular: Reports right sided chest/posterior rib pain that is improved Gastrointestinal:  Denies nausea, heartburn or change in bowel habits Skin: Denies abnormal skin rashes Lymphatics: Denies new lymphadenopathy or easy bruising Neurological:Denies numbness, tingling or new weaknesses Behavioral/Psych: Mood is stable, no new changes  Extremities: No lower extremity edema All other systems were reviewed with the patient and are negative.  I have reviewed the past medical history, past surgical history, social history and family history with the patient and they are unchanged from previous note.   PHYSICAL EXAMINATION:  Vitals:   04/16/19 0722 04/16/19 0828  BP: (!) 145/99   Pulse: 99 (!) 102  Resp: 20 18  Temp: 98.5 F (36.9 C)   SpO2: 99% 98%   Filed Weights   04/11/19 2105 04/14/19 2316  Weight: 181 lb 3.2 oz (82.2 kg) 181 lb 6.4 oz (  82.3 kg)    Intake/Output from previous day: 07/21 0701 - 07/22 0700 In: 240 [P.O.:240] Out: -   GENERAL:alert, no distress and comfortable SKIN: skin color, texture, turgor are normal, no rashes or significant lesions OROPHARYNX: No thrush or  mucositis LYMPH:  no palpable lymphadenopathy in the cervical, axillary or inguinal LUNGS: Diminished to the right base otherwise clear HEART: Regular rate and rhythm, no murmurs and no lower extremity edema ABDOMEN:abdomen soft, non-tender and normal bowel sounds Musculoskeletal:no cyanosis of digits and no clubbing  NEURO: alert & oriented x 3 with fluent speech, no focal motor/sensory deficits  LABORATORY DATA:  I have reviewed the data as listed CMP Latest Ref Rng & Units 04/16/2019 04/15/2019 04/14/2019  Glucose 70 - 99 mg/dL 115(H) 117(H) 115(H)  BUN 6 - 20 mg/dL 10 10 13   Creatinine 0.44 - 1.00 mg/dL 0.64 0.68 0.84  Sodium 135 - 145 mmol/L 139 137 140  Potassium 3.5 - 5.1 mmol/L 3.8 3.4(L) 3.8  Chloride 98 - 111 mmol/L 105 102 104  CO2 22 - 32 mmol/L 26 26 25   Calcium 8.9 - 10.3 mg/dL 9.1 8.9 8.9  Total Protein 6.5 - 8.1 g/dL 6.5 6.4(L) 6.9  Total Bilirubin 0.3 - 1.2 mg/dL 0.4 0.2(L) 0.4  Alkaline Phos 38 - 126 U/L 95 90 97  AST 15 - 41 U/L 12(L) 12(L) 12(L)  ALT 0 - 44 U/L 25 25 28     Lab Results  Component Value Date   WBC 5.7 04/16/2019   HGB 9.0 (L) 04/16/2019   HCT 30.8 (L) 04/16/2019   MCV 90.3 04/16/2019   PLT 360 04/16/2019   NEUTROABS 6.4 04/11/2019    Dg Chest 2 View  Result Date: 04/13/2019 CLINICAL DATA:  48 year old with RIGHT mastectomy on 03/04/2019 for breast cancer, currently being admitted due to a RIGHT pleural effusion. EXAM: CHEST - 2 VIEW 5:30 p.m.: COMPARISON:  Portable chest x-ray earlier same day at 12:43 a.m. and previous chest x-rays. CT chest 04/11/2019 and earlier. FINDINGS: AP SEMI-ERECT and LATERAL images were obtained. Cardiac silhouette mildly enlarged even allowing for AP technique, unchanged. Large RIGHT pleural effusion with a subpulmonic component, unchanged since the examination earlier today, increased in size since yesterday. Associated passive atelectasis in the RIGHT LOWER LOBE. Mild linear atelectasis in the LEFT LOWER LOBE as noted  on the recent CT. No new pulmonary parenchymal abnormalities. RIGHT jugular Port-A-Cath tip at or near the cavoatrial junction. Post surgical changes related to RIGHT mastectomy and axillary node removal. IMPRESSION: 1. Large RIGHT pleural effusion with a subpulmonic component, increased in size since yesterday. Associated passive atelectasis in the RIGHT LOWER LOBE. 2. Mild linear atelectasis in the LEFT LOWER LOBE as noted on the recent CT. 3. No new abnormalities. Electronically Signed   By: Evangeline Dakin M.D.   On: 04/13/2019 18:11   Dg Chest 2 View  Result Date: 04/10/2019 CLINICAL DATA:  Shortness of breath. Right breast carcinoma. Gastroesophageal reflux disease. EXAM: CHEST - 2 VIEW COMPARISON:  12/06/2018 FINDINGS: Heart size is normal. Right-sided power port remains in place. New infiltrate or atelectasis is seen in both lung bases, right side greater than left. No evidence of pleural effusion. IMPRESSION: New bibasilar atelectasis versus infiltrates, right side greater than left. Electronically Signed   By: Marlaine Hind M.D.   On: 04/10/2019 08:11   Ct Angio Chest Pe W And/or Wo Contrast  Result Date: 04/11/2019 CLINICAL DATA:  48 year old female with history of right breast cancer and recent mastectomy presenting  with shortness of breath. EXAM: CT ANGIOGRAPHY CHEST WITH CONTRAST TECHNIQUE: Multidetector CT imaging of the chest was performed using the standard protocol during bolus administration of intravenous contrast. Multiplanar CT image reconstructions and MIPs were obtained to evaluate the vascular anatomy. CONTRAST:  149mL OMNIPAQUE IOHEXOL 350 MG/ML SOLN COMPARISON:  Chest CT dated 03/26/2019 and radiograph dated 04/09/2019 and CT dated 11/15/2018 FINDINGS: Cardiovascular: Borderline cardiomegaly. No pericardial effusion. The thoracic aorta is unremarkable. The origins of the great vessels of the aortic arch are patent as visualized. Evaluation of the pulmonary arteries is limited due  to respiratory motion artifact and suboptimal visualization of the peripheral branches. No large or central pulmonary artery embolus identified. Mediastinum/Nodes: There is a 6.7 x 5.6 cm soft tissue appearing mass in the right cardiophrenic angle with infiltration of the anterior chest wall and extension into the subpectoral region. This is new compared to the CT of 11/15/2018 and has increased in size since the study of 03/26/2019 where it measured approximately 4.0 x 3.7 cm by my measurements. This may represent a loculated malignant effusion versus a soft tissue metastasis. Although, the lobulated border and infiltration into the anterior chest wall suggest metastasis, significant interval increase in size over the course of 2 weeks is somewhat unusual. This can be better evaluated with ultrasound or MRI. No definite mediastinal or hilar adenopathy identified. The esophagus is grossly unremarkable. Right pectoral Port-A-Cath with tip in the region of the right atrium noted. Lungs/Pleura: There is a moderate right pleural effusion with associated partial compressive atelectasis of the right lower lobe as well as areas of atelectatic changes in the right upper lobe. There has been interval increase in the size of the pleural effusion and associated atelectasis since the prior CT. Linear left lung base atelectasis/scarring as well as a small left pleural effusion noted. There is no pneumothorax. The central airways are patent. Upper Abdomen: Ill-defined partially exophytic 3.2 x 2.7 cm lesion from the posterior right lobe of the liver as seen previously. There is a 4 mm right renal upper pole calculus. Musculoskeletal: Surgical clips in the right axilla and right mastectomy. Interval resolution of the right axillary fluid/seroma. Biopsy clip in the left breast. No acute osseous pathology. Review of the MIP images confirms the above findings. IMPRESSION: 1. No CT evidence of central pulmonary artery embolus. 2.  Interval increase in the size of the right pleural effusion with associated partial compressive atelectasis of the right lower lobe. 3. Interval increase in the size of the right cardiophrenic angle soft tissue mass or loculated collection with infiltration of the anterior chest wall. This may represent a loculated malignant effusion versus a soft tissue metastasis. This can be better evaluated with ultrasound or MRI. 4. Ill-defined partially exophytic lesion from the posterior right lobe of the liver as seen on the prior CT. 5. Interval resolution of the right axillary fluid/seroma. Electronically Signed   By: Anner Crete M.D.   On: 04/11/2019 22:08   Ct Angio Chest Pe W Or Wo Contrast  Result Date: 03/26/2019 CLINICAL DATA:  Recent mastectomy for breast carcinoma. Shortness of breath. EXAM: CT ANGIOGRAPHY CHEST WITH CONTRAST TECHNIQUE: Multidetector CT imaging of the chest was performed using the standard protocol during bolus administration of intravenous contrast. Multiplanar CT image reconstructions and MIPs were obtained to evaluate the vascular anatomy. CONTRAST:  31mL ISOVUE-370 IOPAMIDOL (ISOVUE-370) INJECTION 76% COMPARISON:  Chest CT November 15, 2018 FINDINGS: Cardiovascular: There is no demonstrable pulmonary embolus. There is no thoracic aortic aneurysm  or dissection. The visualized great vessels appear unremarkable. Port-A-Cath tip is in the superior vena cava. There is no pericardial effusion or pericardial thickening evident. Mediastinum/Nodes: Thyroid appears unremarkable. There is no appreciable thoracic adenopathy. No esophageal lesions are evident. Lungs/Pleura: There is a fairly small pleural effusion on the right with a rather minimal pleural effusion on the left. There is consolidation with atelectasis in the posterior segments of each lower lobe. There is also mild consolidation in the inferior lingula. No pneumothorax. No pulmonary nodular lesions evident. Upper Abdomen: There is a  mass arising eccentrically from the liver measuring 4.1 x 2.4 cm, an apparent metastatic lesion. Visualized upper abdominal structures otherwise appear unremarkable. Musculoskeletal: No blastic or lytic bone lesions are evident. There is postoperative change in the right breast at the site of recent mastectomy. There is a fluid collection within the right chest region anterior to the muscles in the area of mastectomy measuring 2.4 x 2.2 cm. This structure represents either a seroma or liquefying hematoma. Review of the MIP images confirms the above findings. IMPRESSION: 1. No demonstrable pulmonary embolus. No thoracic aortic aneurysm or dissection. 2. Small pleural effusion on the right with rather minimal pleural effusion on the left. Consolidation noted in both posterior lung bases as well as in the inferior lingula. Suspect atelectasis with likely superimposed pneumonia in these areas. 3.  No thoracic adenopathy. 4. Mass arising eccentrically from the right lobe of the liver posteriorly, felt to represent a metastatic focus, also present on most recent CT. 5. Seroma versus liquefying hematoma and postoperative region right breast measuring 2.4 x 2.2 cm. Electronically Signed   By: Lowella Grip III M.D.   On: 03/26/2019 13:57   Dg Chest Port 1 View  Result Date: 04/13/2019 CLINICAL DATA:  48 year old female with decreased breath sounds. EXAM: PORTABLE CHEST 1 VIEW COMPARISON:  Chest radiograph dated 04/12/2019 FINDINGS: Right-sided Port-A-Cath in similar position. There is shallow inspiration with bibasilar atelectasis. Infiltrate is not excluded. Overall no significant interval change since the earlier radiograph. Probable small bilateral pleural effusions there is no pneumothorax. Stable cardiac silhouette. No acute osseous pathology. Right axillary surgical clips. IMPRESSION: Shallow inspiration with small bilateral pleural effusions and bibasilar atelectasis. Overall similar or slightly worsened  since the prior radiograph. Electronically Signed   By: Anner Crete M.D.   On: 04/13/2019 01:13   Dg Chest Port 1 View  Result Date: 04/12/2019 CLINICAL DATA:  Post thoracentesis EXAM: PORTABLE CHEST 1 VIEW COMPARISON:  04/09/2019 FINDINGS: Slight interval reduction in volume of a right-sided pleural effusion. No significant pneumothorax. Otherwise unchanged examination with cardiomegaly and bandlike scarring or atelectasis of the left lung base. IMPRESSION: Slight interval reduction in volume of a right-sided pleural effusion. No significant pneumothorax. Otherwise unchanged examination with cardiomegaly and bandlike scarring or atelectasis of the left lung base. Electronically Signed   By: Eddie Candle M.D.   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 She presents for thoracentesis. EXAM: ULTRASOUND GUIDED RIGHT THORACENTESIS MEDICATIONS: None. COMPLICATIONS: None immediate. PROCEDURE: An ultrasound guided thoracentesis was thoroughly discussed with the patient and questions answered. The benefits, risks, alternatives and complications were also discussed. The patient understands and wishes to proceed with the procedure. Written consent was obtained. Ultrasound was performed to localize and mark an adequate pocket of fluid in the right chest. The area was then prepped and  draped in the normal sterile fashion. 1% Lidocaine was used for local anesthesia. Under ultrasound guidance a 6 Fr Safe-T-Centesis catheter was introduced. Thoracentesis was performed. The catheter was removed and a dressing applied. FINDINGS: A total of approximately 500 mL of bloody pleural fluid was removed. Samples were sent to the laboratory as requested by the clinical team. IMPRESSION: Successful ultrasound guided right thoracentesis yielding 500 mL of  pleural fluid. Electronically Signed   By: Jacqulynn Cadet M.D.   On: 04/12/2019 13:09    ASSESSMENT: 48 y.o. Brittany Rodgers, [place] woman status post right breast lower outer quadrant biopsy 10/29/2018 for a clinical T2 N0, stage IIB invasive ductal carcinoma, grade 3, triple negative, with an MIB-1 of 80%             (a) CT of the chest 11/15/2018 shows possible liver metastases, but liver biopsy 11/25/2018 consistent with hemangioma             (b) bone scan 11/15/2018 shows no suspicious bone lesions  (1) neoadjuvant chemotherapy consisting of doxorubicin and cyclophosphamide in dose dense fashion x4, started 11/26/2018, completed 01/07/2019, followed by weekly paclitaxel and carboplatin x12 starting 01/28/2019  (2) patient desires ovarian function preservation:              (a) Zoladex started 11/08/2018, discontinued 02/04/2019             (b) patient is status post bilateral tubal ligation  (3) status post right mastectomy with sentinel lymph node sampling 03/04/2019 for a ypTac ypN1a invasive ductal carcinoma grade 3, repeat prognostic panel again triple negative             (a) a total of 6 axillary lymph nodes removed, 2 sentinel ones, 1 positive  (4) adjuvant radiation pending  (5) genetics counseling on 11/28/2018, testing was recommended, but declined by patient.  (6) Staging studies:             (a) chest CT scan and bone scan 11/15/2018 show a liver mass, no lung or bone lesions             (b) liver biopsy 11/25/2018 shows only a hemangioma             (c) chest CT Angie 03/26/2019 again shows liver mass, right breast seroma  (6) pembrolizumab started 04/02/2019, repeated every 21 days  (7) right pleural effusion/right cardiophrenic angle soft tissue mass   PLAN: Ms. Howald appears stable.   Pain is overall better controlled.  She still has intermittent tachycardia but shortness of breath is stable. She was found to have a right pleural effusion which had enlarged  from prior scans.  She also has a right cardiophrenic angle soft tissue mass versus questionable loculated collection with infiltration of the anterior chest wall.  She is status post thoracentesis.  Cytology shows atypical cells consistent with reactive mesothelial cells.  1.  Results of cytology discussed with the patient.  We will obtain a chest x-ray today to evaluate for recurrent pleural effusion.  May need repeat thoracentesis. 2.  Hold on biopsy of possible chest wall mass 3.  Radiation oncology has been notified of the patient's admission.  They will see the patient next week for evaluation for possible radiation to her right chest wall. 3.  We will plan to continue outpatient pembrolizumab. 4.  Wean pain regimen as tolerated 5.  Increase ambulation  We will plan to keep the patient scheduled for follow-up next week for further discussion with [name]  regarding her pleural effusion and right cardiophrenic angle soft tissue mass.   LOS: 5 days   Mikey Bussing, DNP, AGPCNP-BC, AOCNP 04/16/19 Ms. Bozzo appears unchanged.  I discussed the case with Dr. Elsworth Soho.  The chest wall "mass "and effusion may be related to the recent mastectomy.  She will continue outpatient follow-up with [name] as scheduled.  She can undergo a repeat thoracentesis for reaccumulation of the pleural fluid.

## 2019-04-16 NOTE — Telephone Encounter (Signed)
I am confused. Last August I fillled out FMLA papers for her for intermittent leave. Is this what she wants? We don't write letters for "leave of absence" for an entire year

## 2019-04-16 NOTE — Progress Notes (Addendum)
   NAME:  Bree Heinzelman, MRN:  [id number], DOB:  03-01-1971, LOS: 5 ADMISSION DATE:  04/11/2019, CONSULTATION DATE:  7/19  CHIEF COMPLAINT:  Effusion  Brief History   48 year old woman with recent diagnosis of invasive ductal breast cancer of the right breast involving skin, skeletal muscle, 1+ right-sided lymph node.  Also with lesion in the right lobe of the liver.  Admitted now with a progressive right pleural effusion with apparent loculation.  She underwent diagnostic and therapeutic thoracentesis on 7/18, consistent with a neutrophilic exudate    Past Medical History  Breast cancer GERD HLD IBS  Significant Hospital Events   Admitted on 7/18 [facility] with IR on 7/18 500 cc's of sanguinous fluid  Consults:  Pulm and IR  Procedures:  [facility] as noted above  Significant Diagnostic Tests:  BF notable for LDH of 1777  Micro Data:  No growth in BF to date  Antimicrobials:  Cefepime 7/19 >>  Interim history/subjective:   Complains of cough No dyspnea or chest pain Afebrile  Objective   Blood pressure (!) 145/99, pulse (!) 102, temperature 98.5 F (36.9 C), temperature source Oral, resp. rate 18, height 5\' 2"  (1.575 m), weight 82.3 kg, SpO2 98 %.        Intake/Output Summary (Last 24 hours) at 04/16/2019 1139 Last data filed at 04/15/2019 1300 Gross per 24 hour  Intake 240 ml  Output -  Net 240 ml   Filed Weights   04/11/19 2105 04/14/19 2316  Weight: 82.2 kg 82.3 kg    Examination: Adult middle-aged woman sitting in bed, no distress Mild pallor, no icterus, no JVD Decreased breath sounds on the right, clear on left, no rhonchi S1-S2 regular distant Soft nontender abdomen Alert, interactive, nonfocal New edema  Chest x-ray 7/19 personally reviewed -suggest subpulmonic effusion on right  Resolved Hospital Problem list   Na  Assessment & Plan:   Exudative right pleural effusion -cytology showed mesothelial cells Neutrophilic exudate could be  parapneumonic CT chest 7/17 compared to 7/1 shows new soft tissue mass in the right cardiophrenic region with infiltration of anterior chest wall and extension into the subpectoral region which is increased in size from 4 cm to 6.7 cm  Plan With a recent mastectomy, this mass is more likely to be seroma If oncology feels necessary, can proceed with ultrasound-guided biopsy She does not need Pleurx catheter at this time We will treat as parapneumonic effusion with antibiotics for total 10 days and then follow-up imaging    Stage IV breast cancer, triple negative currently on immunotherapy. Plan Per oncology  Addendum-discussed with Dr. Malachy Mood from oncology and Dr. Donne Hazel surgeon who did mastectomy-likely seroma and can be followed as outpatient by surgery If effusion recurs, would suggest repeat thoracentesis  Kara Mead MD. [facility]. [facility] Pager [phone] If no response call [phone]   04/16/2019

## 2019-04-16 NOTE — Progress Notes (Signed)
Pharmacy Antibiotic Note  Brittany Rodgers is a 48 y.o. female admitted on 04/11/2019 with pneumonia.  Pharmacy has been consulted for Vancomycin, cefepime dosing.  Plan: D4 abx, plan 10 days for parapneumonic effusion Cefepime 2gm iv x1, then 2gm q8hr   Height: 5\' 2"  (157.5 cm) Weight: 181 lb 6.4 oz (82.3 kg) IBW/kg (Calculated) : 50.1  Temp (24hrs), Avg:98.6 F (37 C), Min:98.5 F (36.9 C), Max:98.7 F (37.1 C)  Recent Labs  Lab 04/11/19 2028  04/12/19 0356 04/13/19 0511 04/14/19 0150 04/15/19 0403 04/16/19 0408  WBC 9.6  --  8.8 9.1 6.2 5.6 5.7  CREATININE 0.73   < > 0.68 0.93 0.84 0.68 0.64  LATICACIDVEN 1.4  --   --   --   --   --   --    < > = values in this interval not displayed.    Estimated Creatinine Clearance: 84.6 mL/min (by C-G formula based on SCr of 0.64 mg/dL).    Allergies  Allergen Reactions  . Barbiturates Other (See Comments)    Keeps pt awake  . Methocarbamol Itching  . Sulfonamide Derivatives Itching    Antimicrobials this admission: 7/19 Vancomycin  >> 7/21 7/19 Flagyl 7/20 7/19 Cefepime  >>   Dose adjustments this admission:  Microbiology results: 7/17 BCx ngtd 7/18 BCx: ngtd 7/17 Covid: neg 7/18 Pleural fluid: ngtd 7/20 MRSA PCR: neg 7/20 BCx: ngtd  Thank you for allowing pharmacy to be a part of this patient's care.  Minda Ditto PharmD 04/16/2019 2:38 PM

## 2019-04-16 NOTE — Telephone Encounter (Signed)
Spoke with patient. Yes you did fill out  FMLA for her intermittent leave. Patient is saying she needs you to write a letter saying it she needs to be out longer and they will extend her FMLA.

## 2019-04-16 NOTE — Progress Notes (Signed)
PROGRESS NOTE  Brittany Rodgers ZSW:[id number] DOB: 02/27/1971 DOA: 04/11/2019 PCP: [name], MD  HPI/Recap of past 24 hours:  She reports sob is improving, but persistent right sided pain since after surgery  Assessment/Plan: Principal Problem:   Pleural effusion Active Problems:   GERD   Hyperlipidemia   Hypokalemia   Malignant neoplasm of lower-outer quadrant of right breast of female, estrogen receptor negative ([place])   Port-A-Cath in place   S/P mastectomy   Right Sided Effusion  Shortness of Breath:  S/p thoracentesis 7/18, cytology with reactive cells, no malignant cells, culture no growth Repeat cxr pending to decide on repeat thoracentesis Appreciate pulm input, will follow recommendations   Fever: x1 to 102 on 7/19.   Started on broad spectrum abx and recultured.  Possibly tumor fever? Off flagyl.  off vanc  Blood cx NGTD Pleural fluid cx NG Continue cefepime for now.   Soft Tissue Mass at R cardiophrenic angle F/u with surgery and oncology   Breast cancer:Status post mastectomy in 02/2019 Patient currently having immunotherapy. She is scheduled to have follow-up with radiation oncology She reports ongoing right sided pain after surgery, needs to adjust pain meds  Constipation: No bm for a week even though she is getting daily senna and miralax Add suppository    Code Status: full  Family Communication: patient   Disposition Plan: may needs repeat thoracentesis, needs home health   Consultants:  Oncology  pulm  Procedures: S/p thoracentesis 7/18  Antibiotics:  As above   Objective: BP 129/88 (BP Location: Left Arm)   Pulse 92   Temp 98.7 F (37.1 C) (Oral)   Resp 16   Ht 5\' 2"  (1.575 m)   Wt 82.3 kg   SpO2 98%   BMI 33.18 kg/m   Intake/Output Summary (Last 24 hours) at 04/16/2019 1914 Last data filed at 04/16/2019 1600 Gross per 24 hour  Intake 200 ml  Output -  Net 200 ml   Filed Weights   04/11/19 2105  04/14/19 2316  Weight: 82.2 kg 82.3 kg    Exam: Patient is examined daily including today on 04/16/2019, exams remain the same as of yesterday except that has changed    General:  NAD  Cardiovascular: RRR  Respiratory: diminished on the right side  Abdomen: Soft/ND/NT, positive BS  Musculoskeletal: No Edema  Neuro: alert, oriented   Data Reviewed: Basic Metabolic Panel: Recent Labs  Lab 04/12/19 0356 04/13/19 0511 04/14/19 0150 04/15/19 0403 04/16/19 0408  NA 139 135 140 137 139  K 3.4* 3.6 3.8 3.4* 3.8  CL 103 99 104 102 105  CO2 24 25 25 26 26   GLUCOSE 110* 115* 115* 117* 115*  BUN 8 18 13 10 10   CREATININE 0.68 0.93 0.84 0.68 0.64  CALCIUM 8.9 9.4 8.9 8.9 9.1  MG  --  2.0 1.7 1.7 1.9   Liver Function Tests: Recent Labs  Lab 04/12/19 0356 04/13/19 0511 04/14/19 0150 04/15/19 0403 04/16/19 0408  AST 11* 10* 12* 12* 12*  ALT 30 27 28 25 25   ALKPHOS 93 100 97 90 95  BILITOT 0.4 0.6 0.4 0.2* 0.4  PROT 6.8 7.4 6.9 6.4* 6.5  ALBUMIN 3.3* 3.6 3.3* 3.0* 3.1*   No results for input(s): LIPASE, AMYLASE in the last 168 hours. No results for input(s): AMMONIA in the last 168 hours. CBC: Recent Labs  Lab 04/11/19 2028  04/12/19 0356 04/13/19 0511 04/14/19 0150 04/15/19 0403 04/16/19 0408  WBC 9.6  --  8.8 9.1 6.2 5.6 5.7  NEUTROABS 6.4  --   --   --   --   --   --   HGB 10.2*   < > 9.8* 10.5* 10.3* 9.1* 9.0*  HCT 33.5*   < > 32.9* 34.2* 35.6* 31.3* 30.8*  MCV 90.3  --  89.6 88.8 91.3 89.2 90.3  PLT 423*  --  399 377 352 327 360   < > = values in this interval not displayed.   Cardiac Enzymes:   No results for input(s): CKTOTAL, CKMB, CKMBINDEX, TROPONINI in the last 168 hours. BNP (last 3 results) No results for input(s): BNP in the last 8760 hours.  ProBNP (last 3 results) No results for input(s): PROBNP in the last 8760 hours.  CBG: No results for input(s): GLUCAP in the last 168 hours.  Recent Results (from the past 240 hour(s))  SARS  Coronavirus 2 (CEPHEID - Performed in [place] hospital lab), Hosp Order     Status: None   Collection Time: 04/11/19  8:28 PM   Specimen: Nasopharyngeal Swab  Result Value Ref Range Status   SARS Coronavirus 2 NEGATIVE NEGATIVE Final    Comment: (NOTE) If result is NEGATIVE SARS-CoV-2 target nucleic acids are NOT DETECTED. The SARS-CoV-2 RNA is generally detectable in upper and lower  respiratory specimens during the acute phase of infection. The lowest  concentration of SARS-CoV-2 viral copies this assay can detect is 250  copies / mL. A negative result does not preclude SARS-CoV-2 infection  and should not be used as the sole basis for treatment or other  patient management decisions.  A negative result may occur with  improper specimen collection / handling, submission of specimen other  than nasopharyngeal swab, presence of viral mutation(s) within the  areas targeted by this assay, and inadequate number of viral copies  (<250 copies / mL). A negative result must be combined with clinical  observations, patient history, and epidemiological information. If result is POSITIVE SARS-CoV-2 target nucleic acids are DETECTED. The SARS-CoV-2 RNA is generally detectable in upper and lower  respiratory specimens dur ing the acute phase of infection.  Positive  results are indicative of active infection with SARS-CoV-2.  Clinical  correlation with patient history and other diagnostic information is  necessary to determine patient infection status.  Positive results do  not rule out bacterial infection or co-infection with other viruses. If result is PRESUMPTIVE POSTIVE SARS-CoV-2 nucleic acids MAY BE PRESENT.   A presumptive positive result was obtained on the submitted specimen  and confirmed on repeat testing.  While 2019 novel coronavirus  (SARS-CoV-2) nucleic acids may be present in the submitted sample  additional confirmatory testing may be necessary for epidemiological  and / or  clinical management purposes  to differentiate between  SARS-CoV-2 and other Sarbecovirus currently known to infect humans.  If clinically indicated additional testing with an alternate test  methodology [phone]) is advised. The SARS-CoV-2 RNA is generally  detectable in upper and lower respiratory sp ecimens during the acute  phase of infection. The expected result is Negative. Fact Sheet for Patients:  StrictlyIdeas.no Fact Sheet for Healthcare Providers: BankingDealers.co.za This test is not yet approved or cleared by the [place] FDA and has been authorized for detection and/or diagnosis of SARS-CoV-2 by FDA under an Emergency Use Authorization (EUA).  This EUA will remain in effect (meaning this test can be used) for the duration of the COVID-19 declaration under Section 564(b)(1) of the Act, 21 U.S.C.  section 360bbb-3(b)(1), unless the authorization is terminated or revoked sooner. Performed at [facility], [place] [address], [place], [place] [address]   Blood culture (routine x 2)     Status: None (Preliminary result)   Collection Time: 04/11/19  8:28 PM   Specimen: BLOOD  Result Value Ref Range Status   Specimen Description   Final    BLOOD Performed at [facility] [address] [place], [place] [address]    Special Requests   Final    BOTTLES DRAWN AEROBIC AND ANAEROBIC Blood Culture results may not be optimal due to an excessive volume of blood received in culture bottles Performed at [facility] [address] [place], [place] [address]    Culture   Final    NO GROWTH 4 DAYS Performed at [facility], [place] [address], [place], [place] [address]    Report Status PENDING  Incomplete  Blood culture (routine x 2)     Status: None (Preliminary result)   Collection Time: 04/12/19  6:56 AM   Specimen: BLOOD LEFT HAND  Result Value Ref Range Status    Specimen Description   Final    BLOOD LEFT HAND Performed at [facility] [address], [place], [place] [address]    Special Requests   Final    BOTTLES DRAWN AEROBIC ONLY Blood Culture adequate volume Performed at [facility] [address], [place], [place] [address]    Culture   Final    NO GROWTH 4 DAYS Performed at [facility], [place] [address] [place], [place] [address]    Report Status PENDING  Incomplete  Gram stain     Status: None   Collection Time: 04/12/19 12:17 PM   Specimen: Pleura  Result Value Ref Range Status   Specimen Description PLEURAL  Final   Special Requests NONE  Final   Gram Stain   Final    ABUNDANT WBC PRESENT, PREDOMINANTLY PMN NO ORGANISMS SEEN Performed at [facility], [place] [address], [place], [place] [address]    Report Status 04/13/2019 FINAL  Final  Culture, body fluid-bottle     Status: None (Preliminary result)   Collection Time: 04/12/19 12:17 PM   Specimen: Pleura  Result Value Ref Range Status   Specimen Description PLEURAL  Final   Special Requests NONE  Final   Culture   Final    NO GROWTH 4 DAYS Performed at [facility] [address] [place], [place] [address]    Report Status PENDING  Incomplete  MRSA PCR Screening     Status: None   Collection Time: 04/14/19  1:19 AM   Specimen: Nasal Mucosa; Nasopharyngeal  Result Value Ref Range Status   MRSA by PCR NEGATIVE NEGATIVE Final    Comment:        The GeneXpert MRSA Assay (FDA approved for NASAL specimens only), is one component of a comprehensive MRSA colonization surveillance program. It is not intended to diagnose MRSA infection nor to guide or monitor treatment for MRSA infections. Performed at [facility], [place] [address] [place], [place] [address]   Culture, blood (routine x 2)     Status: None (Preliminary result)   Collection Time: 04/14/19  1:50 AM   Specimen: BLOOD  Result  Value Ref Range Status   Specimen Description   Final    BLOOD LEFT ANTECUBITAL Performed at [facility], [address] [place], [place] [address]    Special Requests   Final    BOTTLES DRAWN AEROBIC ONLY Blood Culture adequate volume Performed at [facility] [address] [place], [place] [address]    Culture   Final    NO GROWTH 2 DAYS Performed at [facility] [address] [place], [place] [address]    Report Status PENDING  Incomplete  Culture, blood (routine x 2)     Status: None (Preliminary result)   Collection Time: 04/14/19  1:50 AM   Specimen: BLOOD  Result Value Ref Range Status   Specimen Description   Final    BLOOD BLOOD LEFT HAND Performed at [facility] [address], [place], [place] [address]    Special Requests   Final    BOTTLES DRAWN AEROBIC ONLY Blood Culture adequate volume Performed at [facility] [address] [place], [place] [address]    Culture   Final    NO GROWTH 2 DAYS Performed at [facility] [address] [place], [place] [address]    Report Status PENDING  Incomplete     Studies: Dg Chest 2 View  Result Date: 04/16/2019 CLINICAL DATA:  Right flank and chest pain as well as right shoulder pain. Shortness of breath and weakness. Recent diagnosis of right breast cancer. EXAM: CHEST - 2 VIEW COMPARISON:  04/13/2019 and 11/14/2018 FINDINGS: Right IJ Port-A-Cath unchanged. Persistent moderate size right pleural effusion likely with associated right basilar atelectasis. Left lung is clear. Mild stable cardiomegaly. Remainder of the exam is unchanged. IMPRESSION: Stable moderate size right pleural effusion likely with associated right basilar compressive atelectasis. Mild stable cardiomegaly. Electronically Signed   By: Marin Olp M.D.   On: 04/16/2019 16:39    Scheduled Meds: . atorvastatin  20 mg Oral Daily  . Chlorhexidine Gluconate  Cloth  6 each Topical Daily  . cyclobenzaprine  10 mg Oral TID  . gabapentin  300 mg Oral QHS  . gabapentin  600 mg Oral BID  . heparin  5,000 Units Subcutaneous Q8H  . ipratropium-albuterol  3 mL Nebulization TID  . metoprolol succinate  50 mg Oral Daily  . pantoprazole  40 mg Oral Daily  . polyethylene glycol  17 g Oral BID  . potassium chloride  10 mEq Oral Daily  . senna-docusate  2 tablet Oral QHS  . sodium chloride flush  10-40 mL Intracatheter Q12H    Continuous Infusions: . ceFEPime (MAXIPIME) IV 2 g (04/16/19 1338)     Time spent: 56mins I have personally reviewed and interpreted on  04/16/2019 daily labs, tele strips, imagings as discussed above under date review session and assessment and plans.  I reviewed all nursing notes, pharmacy notes, consultant notes,  vitals, pertinent old records  I have discussed plan of care as described above with RN , patient  on 04/16/2019   Florencia Reasons MD, PhD  [facility] Pager [phone]. If 7PM-7AM, please contact night-coverage at [email or web address], password [facility] 04/16/2019, 7:14 PM  LOS: 5 days

## 2019-04-16 NOTE — Plan of Care (Signed)
Patient remains on room air, states her shortness if breath is much improved post thoracentesis.  Continues to c/o cough, improved with Robitussin.  Patient medicated for pain in the right breast and mid chest multiple times this shift with some improvement.  Tolerating regular diet.

## 2019-04-16 NOTE — Progress Notes (Signed)
Pt refused nocturnal cpap and is not interested in wearing one while here in the hospital.  Pt was encouraged to call should she change her mind.

## 2019-04-17 DIAGNOSIS — Z95828 Presence of other vascular implants and grafts: Secondary | ICD-10-CM

## 2019-04-17 LAB — CULTURE, BODY FLUID W GRAM STAIN -BOTTLE: Culture: NO GROWTH

## 2019-04-17 LAB — CULTURE, BLOOD (ROUTINE X 2)
Culture: NO GROWTH
Culture: NO GROWTH
Special Requests: ADEQUATE

## 2019-04-17 NOTE — Progress Notes (Signed)
Physical Therapy Treatment Patient Details Name: Brittany Rodgers MRN: [id number] DOB: 11-13-1970 Today's Date: 04/17/2019    History of Present Illness 48 yo female admitted with pleural effusion. Hx of breat cancer s/p chemo, mastectomy    PT Comments    Pt received IV pain meds prior to mobilizing.  Pt ambulated to bathroom and used bathroom independently.  Pt ambulated in hallway distance to tolerance.  Pt returned to bed end of session as she reports pain meds make her sleepy.   Follow Up Recommendations  Home health PT;Supervision/Assistance - 24 hour     Equipment Recommendations  Rolling walker with 5" wheels    Recommendations for Other Services       Precautions / Restrictions Precautions Precautions: Fall Restrictions Other Position/Activity Restrictions: WBAT    Mobility  Bed Mobility Overal bed mobility: Modified Independent                Transfers Overall transfer level: Needs assistance Equipment used: None Transfers: Sit to/from Stand Sit to Stand: Min guard         General transfer comment: close guard for safety. increased time.  Ambulation/Gait Ambulation/Gait assistance: Min guard Gait Distance (Feet): 120 Feet Assistive device: IV Pole Gait Pattern/deviations: Step-through pattern;Decreased stride length     General Gait Details: slow gait speed. no c/o SOB. one standing rest break for fatigue and pain   Stairs             Wheelchair Mobility    Modified Rankin (Stroke Patients Only)       Balance                                            Cognition Arousal/Alertness: Awake/alert Behavior During Therapy: WFL for tasks assessed/performed Overall Cognitive Status: Within Functional Limits for tasks assessed                                 General Comments: slow to respond at times      Exercises      General Comments        Pertinent Vitals/Pain Pain Assessment:  0-10 Pain Score: 10-Worst pain ever Pain Location: R flank, midline of back/spine Pain Descriptors / Indicators: Sharp;Guarding;Grimacing;Discomfort;Stabbing Pain Intervention(s): Repositioned;RN gave pain meds during session    Home Living                      Prior Function            PT Goals (current goals can now be found in the care plan section) Progress towards PT goals: Progressing toward goals    Frequency    Min 3X/week      PT Plan Current plan remains appropriate    Co-evaluation              AM-PAC PT "6 Clicks" Mobility   Outcome Measure  Help needed turning from your back to your side while in a flat bed without using bedrails?: None Help needed moving from lying on your back to sitting on the side of a flat bed without using bedrails?: A Little Help needed moving to and from a bed to a chair (including a wheelchair)?: A Little Help needed standing up from a chair using your arms (e.g., wheelchair or bedside chair)?: A  Little Help needed to walk in hospital room?: A Little Help needed climbing 3-5 steps with a railing? : A Little 6 Click Score: 19    End of Session Equipment Utilized During Treatment: Gait belt Activity Tolerance: Patient tolerated treatment well Patient left: in bed;with call bell/phone within reach;with bed alarm set   PT Visit Diagnosis: Muscle weakness (generalized) (M62.81);Difficulty in walking, not elsewhere classified (R26.2)     Time: 2956-2130 PT Time Calculation (min) (ACUTE ONLY): 25 min  Charges:  $Gait Training: 8-22 mins                     Carmelia Bake, PT, DPT Acute Rehabilitation Services Office: [phone] Pager: [phone]  Trena Platt 04/17/2019, 1:17 PM

## 2019-04-17 NOTE — Telephone Encounter (Signed)
Patient verbalizes she is out on FMLA due to her back pain that you have been seeing her for. This is what she is needing an extension on

## 2019-04-17 NOTE — Progress Notes (Signed)
PROGRESS NOTE  Brittany Rodgers YQM:[id number] DOB: 1971-07-27 DOA: 04/11/2019 PCP: [name], MD  HPI/Recap of past 24 hours:  She reports sob is improving, but persistent right sided pain  She is very drowsy after pain meds  Assessment/Plan: Principal Problem:   Pleural effusion Active Problems:   GERD   Hyperlipidemia   Hypokalemia   Malignant neoplasm of lower-outer quadrant of right breast of female, estrogen receptor negative ([place])   Port-A-Cath in place   S/P mastectomy   Right Sided Effusion  Shortness of Breath:  S/p thoracentesis 7/18, cytology with reactive cells, no malignant cells, culture no growth Repeat cxr showed moderate amount of right sided pleural effusion, case discussed with critical care who recommend repeat thoracentesis by IR with repeat fluids study and cytology Appreciate pulm input, will follow recommendations   Fever: x1 to 102 on 7/19.   Started on broad spectrum abx and recultured.  Possibly tumor fever? Off flagyl.  off vanc  Blood cx NGTD Pleural fluid cx NG Continue cefepime for now.   Soft Tissue Mass at R cardiophrenic angle F/u with surgery and oncology   Breast cancer:Status post mastectomy in 02/2019 Patient currently having immunotherapy. She is scheduled to have follow-up with radiation oncology She reports ongoing right sided pain after surgery, needs to adjust pain meds  Constipation: No bm for a week even though she is getting daily senna and miralax Add suppository    Code Status: full  Family Communication: patient   Disposition Plan: needs repeat thoracentesis, needs home health   Consultants:  Oncology  pulm  Procedures: S/p thoracentesis 7/18  Antibiotics:  As above   Objective: BP (!) 142/92 (BP Location: Left Arm)   Pulse 99   Temp 98.2 F (36.8 C) (Oral)   Resp 14   Ht 5\' 2"  (1.575 m)   Wt 82.3 kg   SpO2 95%   BMI 33.18 kg/m   Intake/Output Summary (Last 24 hours) at  04/17/2019 1445 Last data filed at 04/17/2019 0600 Gross per 24 hour  Intake 870 ml  Output -  Net 870 ml   Filed Weights   04/11/19 2105 04/14/19 2316  Weight: 82.2 kg 82.3 kg    Exam: Patient is examined daily including today on 04/17/2019, exams remain the same as of yesterday except that has changed    General:  NAD  Cardiovascular: RRR  Respiratory: diminished on the right side  Abdomen: Soft/ND/NT, positive BS  Musculoskeletal: No Edema  Neuro: alert, oriented   Data Reviewed: Basic Metabolic Panel: Recent Labs  Lab 04/12/19 0356 04/13/19 0511 04/14/19 0150 04/15/19 0403 04/16/19 0408  NA 139 135 140 137 139  K 3.4* 3.6 3.8 3.4* 3.8  CL 103 99 104 102 105  CO2 24 25 25 26 26   GLUCOSE 110* 115* 115* 117* 115*  BUN 8 18 13 10 10   CREATININE 0.68 0.93 0.84 0.68 0.64  CALCIUM 8.9 9.4 8.9 8.9 9.1  MG  --  2.0 1.7 1.7 1.9   Liver Function Tests: Recent Labs  Lab 04/12/19 0356 04/13/19 0511 04/14/19 0150 04/15/19 0403 04/16/19 0408  AST 11* 10* 12* 12* 12*  ALT 30 27 28 25 25   ALKPHOS 93 100 97 90 95  BILITOT 0.4 0.6 0.4 0.2* 0.4  PROT 6.8 7.4 6.9 6.4* 6.5  ALBUMIN 3.3* 3.6 3.3* 3.0* 3.1*   No results for input(s): LIPASE, AMYLASE in the last 168 hours. No results for input(s): AMMONIA in the last 168 hours.  CBC: Recent Labs  Lab 04/11/19 2028  04/12/19 0356 04/13/19 0511 04/14/19 0150 04/15/19 0403 04/16/19 0408  WBC 9.6  --  8.8 9.1 6.2 5.6 5.7  NEUTROABS 6.4  --   --   --   --   --   --   HGB 10.2*   < > 9.8* 10.5* 10.3* 9.1* 9.0*  HCT 33.5*   < > 32.9* 34.2* 35.6* 31.3* 30.8*  MCV 90.3  --  89.6 88.8 91.3 89.2 90.3  PLT 423*  --  399 377 352 327 360   < > = values in this interval not displayed.   Cardiac Enzymes:   No results for input(s): CKTOTAL, CKMB, CKMBINDEX, TROPONINI in the last 168 hours. BNP (last 3 results) No results for input(s): BNP in the last 8760 hours.  ProBNP (last 3 results) No results for input(s): PROBNP in  the last 8760 hours.  CBG: No results for input(s): GLUCAP in the last 168 hours.  Recent Results (from the past 240 hour(s))  SARS Coronavirus 2 (CEPHEID - Performed in [place] hospital lab), Hosp Order     Status: None   Collection Time: 04/11/19  8:28 PM   Specimen: Nasopharyngeal Swab  Result Value Ref Range Status   SARS Coronavirus 2 NEGATIVE NEGATIVE Final    Comment: (NOTE) If result is NEGATIVE SARS-CoV-2 target nucleic acids are NOT DETECTED. The SARS-CoV-2 RNA is generally detectable in upper and lower  respiratory specimens during the acute phase of infection. The lowest  concentration of SARS-CoV-2 viral copies this assay can detect is 250  copies / mL. A negative result does not preclude SARS-CoV-2 infection  and should not be used as the sole basis for treatment or other  patient management decisions.  A negative result may occur with  improper specimen collection / handling, submission of specimen other  than nasopharyngeal swab, presence of viral mutation(s) within the  areas targeted by this assay, and inadequate number of viral copies  (<250 copies / mL). A negative result must be combined with clinical  observations, patient history, and epidemiological information. If result is POSITIVE SARS-CoV-2 target nucleic acids are DETECTED. The SARS-CoV-2 RNA is generally detectable in upper and lower  respiratory specimens dur ing the acute phase of infection.  Positive  results are indicative of active infection with SARS-CoV-2.  Clinical  correlation with patient history and other diagnostic information is  necessary to determine patient infection status.  Positive results do  not rule out bacterial infection or co-infection with other viruses. If result is PRESUMPTIVE POSTIVE SARS-CoV-2 nucleic acids MAY BE PRESENT.   A presumptive positive result was obtained on the submitted specimen  and confirmed on repeat testing.  While 2019 novel coronavirus   (SARS-CoV-2) nucleic acids may be present in the submitted sample  additional confirmatory testing may be necessary for epidemiological  and / or clinical management purposes  to differentiate between  SARS-CoV-2 and other Sarbecovirus currently known to infect humans.  If clinically indicated additional testing with an alternate test  methodology [id number]) is advised. The SARS-CoV-2 RNA is generally  detectable in upper and lower respiratory sp ecimens during the acute  phase of infection. The expected result is Negative. Fact Sheet for Patients:  StrictlyIdeas.no Fact Sheet for Healthcare Providers: BankingDealers.co.za This test is not yet approved or cleared by the [place] FDA and has been authorized for detection and/or diagnosis of SARS-CoV-2 by FDA under an Emergency Use Authorization (EUA).  This EUA will  remain in effect (meaning this test can be used) for the duration of the COVID-19 declaration under Section 564(b)(1) of the Act, 21 U.S.C. section 360bbb-3(b)(1), unless the authorization is terminated or revoked sooner. Performed at [facility], [place] [address] [place], [place] [address]   Blood culture (routine x 2)     Status: None   Collection Time: 04/11/19  8:28 PM   Specimen: BLOOD  Result Value Ref Range Status   Specimen Description   Final    BLOOD Performed at [facility] [address] [place], [place] [address]    Special Requests   Final    BOTTLES DRAWN AEROBIC AND ANAEROBIC Blood Culture results may not be optimal due to an excessive volume of blood received in culture bottles Performed at [facility] [address] [place], [place] [address]    Culture   Final    NO GROWTH 5 DAYS Performed at [facility], [place] [address], [place], [place] [address]    Report Status 04/17/2019 FINAL  Final  Blood culture (routine x 2)     Status:  None   Collection Time: 04/12/19  6:56 AM   Specimen: BLOOD LEFT HAND  Result Value Ref Range Status   Specimen Description   Final    BLOOD LEFT HAND Performed at [facility] [address], [place], [place] [address]    Special Requests   Final    BOTTLES DRAWN AEROBIC ONLY Blood Culture adequate volume Performed at [facility] [address], [place], [place] [address]    Culture   Final    NO GROWTH 5 DAYS Performed at [facility], [place] [address], [place], [place] [address]    Report Status 04/17/2019 FINAL  Final  Gram stain     Status: None   Collection Time: 04/12/19 12:17 PM   Specimen: Pleura  Result Value Ref Range Status   Specimen Description PLEURAL  Final   Special Requests NONE  Final   Gram Stain   Final    ABUNDANT WBC PRESENT, PREDOMINANTLY PMN NO ORGANISMS SEEN Performed at [facility], [place] [address], [place], [place] [address]    Report Status 04/13/2019 FINAL  Final  Culture, body fluid-bottle     Status: None   Collection Time: 04/12/19 12:17 PM   Specimen: Pleura  Result Value Ref Range Status   Specimen Description PLEURAL  Final   Special Requests NONE  Final   Culture   Final    NO GROWTH 5 DAYS Performed at [facility] [address], [place], [place] [address]    Report Status 04/17/2019 FINAL  Final  MRSA PCR Screening     Status: None   Collection Time: 04/14/19  1:19 AM   Specimen: Nasal Mucosa; Nasopharyngeal  Result Value Ref Range Status   MRSA by PCR NEGATIVE NEGATIVE Final    Comment:        The GeneXpert MRSA Assay (FDA approved for NASAL specimens only), is one component of a comprehensive MRSA colonization surveillance program. It is not intended to diagnose MRSA infection nor to guide or monitor treatment for MRSA infections. Performed at [facility], [place] [address], [place], [place] [address]   Culture, blood (routine x 2)     Status:  None (Preliminary result)   Collection Time: 04/14/19  1:50 AM   Specimen: BLOOD  Result Value Ref Range  Status   Specimen Description   Final    BLOOD LEFT ANTECUBITAL Performed at [facility] [address], [place], [place] [address]    Special Requests   Final    BOTTLES DRAWN AEROBIC ONLY Blood Culture adequate volume Performed at [facility] [address], [place], [place] [address]    Culture   Final    NO GROWTH 3 DAYS Performed at [facility], [place] [address], [place], [place] [address]    Report Status PENDING  Incomplete  Culture, blood (routine x 2)     Status: None (Preliminary result)   Collection Time: 04/14/19  1:50 AM   Specimen: BLOOD LEFT HAND  Result Value Ref Range Status   Specimen Description   Final    BLOOD LEFT HAND Performed at [facility], [place] [address] [place], [place] [address]    Special Requests   Final    BOTTLES DRAWN AEROBIC ONLY Blood Culture adequate volume Performed at [facility] [address], [place], [place] [address]    Culture   Final    NO GROWTH 3 DAYS Performed at [facility], [place] [address] [place], [place] [address]    Report Status PENDING  Incomplete     Studies: Dg Chest 2 View  Result Date: 04/16/2019 CLINICAL DATA:  Right flank and chest pain as well as right shoulder pain. Shortness of breath and weakness. Recent diagnosis of right breast cancer. EXAM: CHEST - 2 VIEW COMPARISON:  04/13/2019 and 11/14/2018 FINDINGS: Right IJ Port-A-Cath unchanged. Persistent moderate size right pleural effusion likely with associated right basilar atelectasis. Left lung is clear. Mild stable cardiomegaly. Remainder of the exam is unchanged. IMPRESSION: Stable moderate size right pleural effusion likely with associated right basilar compressive atelectasis. Mild stable cardiomegaly. Electronically Signed   By: Marin Olp M.D.   On: 04/16/2019 16:39     Scheduled Meds: . atorvastatin  20 mg Oral Daily  . bisacodyl  10 mg Rectal Daily  . Chlorhexidine Gluconate Cloth  6 each Topical Daily  . cyclobenzaprine  10 mg Oral TID  . gabapentin  300 mg Oral QHS  . gabapentin  600 mg Oral BID  . heparin  5,000 Units Subcutaneous Q8H  . ipratropium-albuterol  3 mL Nebulization BID  . metoprolol succinate  50 mg Oral Daily  . pantoprazole  40 mg Oral Daily  . polyethylene glycol  17 g Oral BID  . potassium chloride  10 mEq Oral Daily  . senna-docusate  2 tablet Oral BID  . sodium chloride flush  10-40 mL Intracatheter Q12H    Continuous Infusions: . ceFEPime (MAXIPIME) IV 2 g (04/17/19 1607)     Time spent: 90mins I have personally reviewed and interpreted on  04/17/2019 daily labs, tele strips, imagings as discussed above under date review session and assessment and plans.  I reviewed all nursing notes, pharmacy notes, consultant notes,  vitals, pertinent old records  I have discussed plan of care as described above with RN , patient  on 04/17/2019   Florencia Reasons MD, PhD  [facility] Pager [phone]. If 7PM-7AM, please contact night-coverage at [email or web address], password [facility] 04/17/2019, 2:45 PM  LOS: 6 days

## 2019-04-17 NOTE — Telephone Encounter (Signed)
She will need to ask her Oncology doctor to write this letter. The reason she has been out of work is her cancer, and I have not been involved with that at all

## 2019-04-17 NOTE — Progress Notes (Signed)
Pt continues to refuse CPAP qhs.  Pt encouraged to contact RT should she change her mind. 

## 2019-04-18 ENCOUNTER — Encounter: Payer: BC Managed Care – PPO | Admitting: Physical Therapy

## 2019-04-18 DIAGNOSIS — E876 Hypokalemia: Secondary | ICD-10-CM

## 2019-04-18 MED ORDER — NYSTATIN 100000 UNIT/ML MT SUSP: 5.0000 mL | Freq: Four times a day (QID) | OROMUCOSAL | 0 refills | Status: DC

## 2019-04-18 MED ORDER — HEPARIN SOD (PORK) LOCK FLUSH 100 UNIT/ML IV SOLN
500.0000 [IU] | INTRAVENOUS | Status: AC | PRN
  Administered 2019-04-18: 500 [IU]

## 2019-04-18 MED ORDER — ALTEPLASE 2 MG IJ SOLR
2.0000 mg | Freq: Once | INTRAMUSCULAR | Status: AC
  Administered 2019-04-18: 12:00:00 2 mg
  Filled 2019-04-18: qty 2

## 2019-04-18 MED ORDER — VALACYCLOVIR HCL 500 MG PO TABS: 500.0000 mg | ORAL_TABLET | Freq: Two times a day (BID) | ORAL | 1 refills | Status: AC

## 2019-04-18 MED ORDER — GABAPENTIN 300 MG PO CAPS
600.0000 mg | ORAL_CAPSULE | Freq: Once | ORAL | Status: AC
  Administered 2019-04-18: 600 mg via ORAL
  Filled 2019-04-18: qty 2

## 2019-04-18 MED ORDER — GABAPENTIN 300 MG PO CAPS: 300.0000 mg | ORAL_CAPSULE | Freq: Three times a day (TID) | ORAL | 1 refills | Status: DC

## 2019-04-18 MED ORDER — NYSTATIN 100000 UNIT/ML MT SUSP
5.0000 mL | Freq: Four times a day (QID) | OROMUCOSAL | Status: DC
  Administered 2019-04-18: 500000 [IU] via ORAL
  Filled 2019-04-18: qty 5

## 2019-04-18 NOTE — Progress Notes (Addendum)
AVS reviewed with patient.  Verbalized understanding of discharge instructions, physician follow-up, medications.  Medications picked up from pharmacy and returned to patient.  [place] delivered to patient.  Patient transported by NT via wheelchair to main entrance at discharge.  Reports pain improved after schedule Flexeril dose and Gabapentin.  Patient stable at time of discharge

## 2019-04-18 NOTE — Telephone Encounter (Signed)
This still makes no sense. I filled out FMLA froms for her dated 05-02-18 for INTERMITTENT leave for the back pain, but never for continuous leave. I have NOT excused her for the entire year. FMLA does not work that way. She mentioned to me she may file for disability and I said I would support that, but we have not started anything yet

## 2019-04-18 NOTE — Evaluation (Signed)
Occupational Therapy Evaluation Patient Details Name: Brittany Rodgers MRN: [id number] DOB: March 30, 1971 Today's Date: 04/18/2019    History of Present Illness 48 yo female admitted with pleural effusion. Hx of breat cancer s/p chemo, mastectomy   Clinical Impression   Pt admitted with above diagnoses, decreased activity tolerance, pain and restricted UE use decreasing ability to engage in BADL at desired level of ind. PTA pt ind with BADL/IADL activity with light assist from husband at home. At time of eval she is overall min guard for bed mobility and transfers,mostly limited 2/2 SOB and decreased activity tolerance. Educated pt on ECS strategies in reference to BADL. Pt sometimes slow to respond. Overall recommend pt receive HHOT at d/c for continued ECS education and implementation to regain ind in BADL/IADL activities from baseline. Will continue to follow acutely per POC listed below.     Follow Up Recommendations  Home health OT;Supervision - Intermittent    Equipment Recommendations  None recommended by OT    Recommendations for Other Services       Precautions / Restrictions Precautions Precautions: Fall Restrictions Weight Bearing Restrictions: No Other Position/Activity Restrictions: WBAT      Mobility Bed Mobility Overal bed mobility: Modified Independent                Transfers Overall transfer level: Needs assistance Equipment used: None Transfers: Sit to/from Stand Sit to Stand: Min guard         General transfer comment: min guard for safety, increased time    Balance Overall balance assessment: Needs assistance Sitting-balance support: Bilateral upper extremity supported;No upper extremity supported Sitting balance-Leahy Scale: Good     Standing balance support: Bilateral upper extremity supported;During functional activity Standing balance-Leahy Scale: Fair                             ADL either performed or assessed with  clinical judgement   ADL Overall ADL's : Needs assistance/impaired Eating/Feeding: Set up;Sitting Eating/Feeding Details (indicate cue type and reason): in recliner Grooming: Set up;Sitting;Standing   Upper Body Bathing: Supervision/ safety;Sitting   Lower Body Bathing: Minimal assistance;Sitting/lateral leans;Sit to/from stand   Upper Body Dressing : Set up;Sitting   Lower Body Dressing: Minimal assistance;Sit to/from stand;Sitting/lateral leans   Toilet Transfer: Min Marine scientist Details (indicate cue type and reason): BSC over toilet Toileting- Clothing Manipulation and Hygiene: Set up;Sit to/from stand;Sitting/lateral lean   Tub/ Shower Transfer: Min guard;Shower seat   Functional mobility during ADLs: Min guard General ADL Comments: pt overall experiences BADL limitations due to SOB and decreased activity tolerance     Vision Patient Visual Report: No change from baseline       Perception     Praxis      Pertinent Vitals/Pain Pain Assessment: Faces Faces Pain Scale: Hurts little more Pain Location: R flank, midline of back/spine Pain Intervention(s): Repositioned;Limited activity within patient's tolerance;Monitored during session     Hand Dominance     Extremity/Trunk Assessment Upper Extremity Assessment Upper Extremity Assessment: RUE deficits/detail RUE Deficits / Details: restricted R UE   Lower Extremity Assessment Lower Extremity Assessment: Defer to PT evaluation       Communication Communication Communication: No difficulties   Cognition Arousal/Alertness: Awake/alert Behavior During Therapy: WFL for tasks assessed/performed Overall Cognitive Status: Within Functional Limits for tasks assessed  General Comments: slow to respond at times   General Comments       Exercises     Shoulder Instructions      Home Living Family/patient expects to be discharged to::  Private residence Living Arrangements: Spouse/significant other Available Help at Discharge: Family;Available PRN/intermittently Type of Home: House Home Access: Stairs to enter [employer] of Steps: 3 Entrance Stairs-Rails: Right Home Layout: One level     Bathroom Shower/Tub: Teacher, early years/pre: Standard     Home Equipment: Shower seat          Prior Functioning/Environment Level of Independence: Independent        Comments: Had outpatient PT for R shoulder/lymphedema in 10/2017        OT Problem List: Decreased strength;Decreased knowledge of use of DME or AE;Decreased range of motion;Decreased activity tolerance;Impaired UE functional use;Impaired balance (sitting and/or standing);Pain      OT Treatment/Interventions: Self-care/ADL training;Therapeutic exercise;Patient/family education;Balance training;Energy conservation;DME and/or AE instruction;Therapeutic activities    OT Goals(Current goals can be found in the care plan section) Acute Rehab OT Goals Patient Stated Goal: to decrease pain OT Goal Formulation: With patient Time For Goal Achievement: 05/02/19 Potential to Achieve Goals: Good  OT Frequency: Min 2X/week   Barriers to D/C:            Co-evaluation              AM-PAC OT "6 Clicks" Daily Activity     Outcome Measure Help from another person eating meals?: None Help from another person taking care of personal grooming?: None Help from another person toileting, which includes using toliet, bedpan, or urinal?: A Little Help from another person bathing (including washing, rinsing, drying)?: A Little Help from another person to put on and taking off regular upper body clothing?: None Help from another person to put on and taking off regular lower body clothing?: A Little 6 Click Score: 21   End of Session Equipment Utilized During Treatment: Gait belt  Activity Tolerance: Patient tolerated treatment well Patient  left: in chair;with call bell/phone within reach  OT Visit Diagnosis: Other abnormalities of gait and mobility (R26.89);Muscle weakness (generalized) (M62.81);Pain Pain - Right/Left: Right Pain - part of body: Arm(flank, back)                Time: 7858-8502 OT Time Calculation (min): 17 min Charges:  OT General Charges $OT Visit: 1 Visit OT Evaluation $OT Eval Moderate Complexity: [place], MSOT, OTR/L [employer] OT/ Acute Relief OT [facility] Office: [phone]   Zenovia Jarred 04/18/2019, 2:02 PM

## 2019-04-18 NOTE — TOC Transition Note (Signed)
Transition of Care [facility]) - CM/SW Discharge Note   Patient Details  Name: Brittany Rodgers MRN: [id number] Date of Birth: [birth year]  [facility] CM/SW Contact:  Dessa Phi, RN Phone Number: 04/18/2019, 1:13 PM   Clinical Narrative:d/c home w/HHC-[facility] chosen, & accepted. CM TC [facility] to inform of d/c today informed secy.[facility] will start services 1 week from today-patient/attending in agreement. Ordered for HHPT/OT. DME needed rw-[facility] dme rep aware of d/c today. Patient has own transport home. No further CM needs.             Patient Goals and CMS Choice        Discharge Placement                       Discharge Plan and Services                                     Social Determinants of Health (SDOH) Interventions     Readmission Risk Interventions No flowsheet data found.

## 2019-04-18 NOTE — Progress Notes (Signed)
Patient c/o 10/10 pain after receiving PRN pain medication.  Patient reports that she takes an afternoon dose of Gabapentin at home and this helps her pain.  Dr. Erlinda Hong on unit and notified.  Dr. Erlinda Hong gave order for one time dose of Gabapentin 600 mg.

## 2019-04-18 NOTE — Progress Notes (Signed)
Date and time of tPA/Alteplase insertion: 04/18/19, [place], Gillermina Phy, Therapist, sports

## 2019-04-18 NOTE — Progress Notes (Signed)
Date and time of tPA/Alteplase removal: 04/18/19, 1420  [place]leyne Lac, Gillermina Phy, Therapist, sports

## 2019-04-18 NOTE — Progress Notes (Signed)
Patient states this morning that she "does not want to have the thoracentesis today."  Patient alert and oriented.

## 2019-04-18 NOTE — Telephone Encounter (Signed)
Ria Comment can you please f/u with this patient

## 2019-04-18 NOTE — Progress Notes (Signed)
VAST to de-access and re-access port (scheduled).  Upon flushing current needle, easy to flush, but unable to obtain blood return. De-accessed port and followed protocol to re-access. Noted port tilting inward and downward at bottom edge. Unable to obtain blood return or flush with new needle placement. Removed needle. Pt admitted that right bottom side of port was tender and has been tender for a few days; no redness or swelling of port area noted. Obtained new supplies, repositioned pt, and re-accessed port; no blood return obtained, but easy to flush.  Order for TPA entered and unit RN, Olean Ree notified.

## 2019-04-18 NOTE — Telephone Encounter (Signed)
Left message for patient to call back. CRM created 

## 2019-04-18 NOTE — Progress Notes (Signed)
Patient reports she is unsure if she wants the thoracentesis on 7/25 due to her "severe pain" after her last thoracentesis.  Patient to be reswabbed for Covid prior to this procedure. Patient wants to wait on the swab at this time.  Dr. Erlinda Hong notified and oncoming RN notified.

## 2019-04-18 NOTE — TOC Progression Note (Signed)
Transition of Care [facility]) - Progression Note    Patient Details  Name: Leean Amezcua MRN: [id number] Date of Birth: June 08, 1971  [facility] CM/SW Contact  Rondle Lohse, Juliann Pulse, RN Phone Number: 04/18/2019, 5:26 PM  Clinical Narrative: @ 4:50p Received call from [facility] [place] rep LaShanda that they are unable to accept. Patietn agreeable to still d/c home,she feels she will be ok, & will f/u w/her pcp if she needs HHC.Updated leadership.No further CM needs.         Barriers to Discharge: No Barriers Identified  Expected Discharge Plan and Services           Expected Discharge Date: 04/18/19               DME Arranged: Gilford Rile rolling DME Agency: [employer] Date DME Agency Contacted: 04/18/19 Time DME Agency Contacted: [phone] Representative spoke with at DME Agency: [facility] HH Arranged: PT, OT HH Agency: [facility] Date [place]: 04/18/19 Time [place]: 7340     Social Determinants of Health (SDOH) Interventions    Readmission Risk Interventions No flowsheet data found.

## 2019-04-18 NOTE — Discharge Summary (Signed)
Discharge Summary  [place]ima TIR:[id number] DOB: 04/15/1971  PCP: [name], MD  Admit date: 04/11/2019 Discharge date: 04/18/2019  Time spent: 39mins, more than 50% time spent on coordination of care.   Recommendations for Outpatient Follow-up:  1. F/u with PCP within a week  for hospital discharge follow up, repeat cbc/bmp at follow up 2. F/u with oncology Dr Jana Hakim on 7/27 3. F/u with radiation oncology Dr Isidore Moos on 7/27 4. F/u with general surgery Dr Donne Hazel on 7/29 5. Home health and walker arranged   Discharge Diagnoses:  Active Hospital Problems   Diagnosis Date Noted   Pleural effusion on right 04/11/2019   S/P mastectomy 03/04/2019   Port-A-Cath in place 01/07/2019   Malignant neoplasm of lower-outer quadrant of right breast of female, estrogen receptor negative ([place]) 11/04/2018   Hypokalemia 02/13/2017   Hyperlipidemia 12/23/2010   GERD 10/14/2007    Resolved Hospital Problems  No resolved problems to display.    Discharge Condition: stable  Diet recommendation: regular diet  Filed Weights   04/11/19 2105 04/14/19 2316  Weight: 82.2 kg 82.3 kg    History of present illness: (per admitting MD Dr Jonelle Sidle) PCP: [name], MD   Outpatient Specialists: [name]  Patient coming from: Home  Chief Complaint: Shortness of breath  HPI: Brittany Rodgers is a 48 y.o. female with medical history significant of right breast cancer status post chemotherapy and mastectomy about a month ago also GERD that came in with shortness of breath and tachycardia.  Patient was tachypneic in the ER.  She was sent over for work-up for possible PE.  She has some chest pain but nothing out of the ordinary.  She has shortness of breath with exertion mainly.  No fever.  No nausea vomiting or diarrhea.  Patient was evaluated with CT angiogram of the chest showing no PE but patient has extensive pleural effusion on the right which looks complex possibly  loculated.  Patient is being admitted for evaluation of her pleural effusion and sinus tachycardia..  ED Course: Temperature is 98.6 blood pressure 162/97 pulse 101 respiratory 95 oxygen sats 97% room air.  Sodium 140 potassium 3.3 chloride 105 CO2 25 glucose 105 BUN 11 creatinine 0.73.  White count 9.6 hemoglobin 10.1 platelets 423.  COVID-19 testing was negative.  CT angiogram of the chest showed no PE.  There was interval increase in size of right pleural effusion with associated partial compressive atelectasis of the right lower lobe.  Also right costal phrenic angle blunting.  Patient is therefore being admitted for work-up.  Hospital Course:  Principal Problem:   Pleural effusion on right Active Problems:   GERD   Hyperlipidemia   Hypokalemia   Malignant neoplasm of lower-outer quadrant of right breast of female, estrogen receptor negative ([place])   Port-A-Cath in place   S/P mastectomy   Right Sided Effusion  Shortness of Breath:  S/p thoracentesis 7/18, cytology with reactive cells, no malignant cells, culture no growth Repeat cxr showed moderate amount of right sided pleural effusion, case discussed with critical care who recommend repeat thoracentesis by IR with repeat fluids study and cytology, patient declined repeat thoracentesis,  She reports extreme pain after last thoracentesis She has no hypoxic, not in respiratory distress, she ambulated with PT in the hallway on room air, she is discharged home with close follow ups.   Fever: x1 to 102 on 7/19.  Blood cx NGTD Pleural fluid cx NG There is no leukocytosis She was  Started on broad spectrum abx with vanc/cefepime and flagyl on admission, then cefepime alone, she finished total of 7 days abx treatment in the hospital fever resolved, all culture are negative.  Mild oral thrush: Topical nystatin     Soft Tissue Mass at R cardiophrenic angle F/u with surgery and oncology   Breast cancer:Status post mastectomy  in 02/2019 Patient currently having immunotherapy. She is scheduled to have follow-up with radiation oncology She reports ongoing right sided pain after surgery, needs to adjust pain meds on outpatient basis I have advised patient not to drive while taking narcotics   Constipation: No bm for a week even though she is getting daily senna and miralax Add suppository  Had bm   Code Status: full  Family Communication: patient   Disposition Plan: home with home health   Consultants:  Oncology  pulm  Procedures: S/p thoracentesis 7/18 declined repeat thoracentesis on 7/24  Antibiotics:  As above   Discharge Exam: BP (!) 153/88 (BP Location: Left Arm)    Pulse (!) 105    Temp 98.3 F (36.8 C) (Oral)    Resp 18    Ht 5\' 2"  (1.575 m)    Wt 82.3 kg    SpO2 99%    BMI 33.18 kg/m     General:  NAD, mild oral thrush  Cardiovascular: RRR  Respiratory: diminished on the right side  Abdomen: Soft/ND/NT, positive BS  Musculoskeletal: No Edema  Neuro: alert, oriented    Discharge Instructions You were cared for by a hospitalist during your hospital stay. If you have any questions about your discharge medications or the care you received while you were in the hospital after you are discharged, you can call the unit and asked to speak with the hospitalist on call if the hospitalist that took care of you is not available. Once you are discharged, your primary care physician will handle any further medical issues. Please note that NO REFILLS for any discharge medications will be authorized once you are discharged, as it is imperative that you return to your primary care physician (or establish a relationship with a primary care physician if you do not have one) for your aftercare needs so that they can reassess your need for medications and monitor your lab values.  Discharge Instructions    Diet - low sodium heart healthy   Complete by: As directed    Increase  activity slowly   Complete by: As directed      Allergies as of 04/18/2019      Reactions   Barbiturates Other (See Comments)   Keeps pt awake   Methocarbamol Itching   Sulfonamide Derivatives Itching      Medication List    STOP taking these medications   ondansetron 8 MG tablet Commonly known as: ZOFRAN   prochlorperazine 10 MG tablet Commonly known as: COMPAZINE   traMADol 50 MG tablet Commonly known as: ULTRAM     TAKE these medications   atorvastatin 20 MG tablet Commonly known as: LIPITOR Take 1 tablet (20 mg total) by mouth daily.   cyclobenzaprine 10 MG tablet Commonly known as: FLEXERIL Take 1 tablet (10 mg total) by mouth 3 (three) times daily.   gabapentin 300 MG capsule Commonly known as: NEURONTIN Take 1 capsule (300 mg total) by mouth 3 (three) times daily. What changed:   when to take this  Another medication with the same name was removed. Continue taking this medication, and follow the directions you see here.  lidocaine-prilocaine cream Commonly known as: EMLA Apply to affected area once What changed:   how much to take  how to take this  when to take this  reasons to take this  additional instructions   loratadine 10 MG tablet Commonly known as: Claritin Take 1 tablet (10 mg total) by mouth daily.   metoprolol succinate 50 MG 24 hr tablet Commonly known as: TOPROL-XL Take 1 tablet (50 mg total) by mouth daily. Take with or immediately following a meal.   nystatin 100000 UNIT/ML suspension Commonly known as: MYCOSTATIN Take 5 mLs (500,000 Units total) by mouth 4 (four) times daily.   Omeprazole-Sodium Bicarbonate 20-1100 MG Caps capsule Commonly known as: Zegerid Take 1 capsule by mouth daily before breakfast.   oxyCODONE 5 MG immediate release tablet Commonly known as: Oxy IR/ROXICODONE Take 1 tablet (5 mg total) by mouth every 4 (four) hours as needed for moderate pain.   polyethylene glycol 17 g packet Commonly known  as: MIRALAX / GLYCOLAX Take 17 g by mouth as needed. What changed:   when to take this  reasons to take this  additional instructions   potassium chloride 10 MEQ tablet Commonly known as: K-DUR TAKE 1 TABLET BY MOUTH ONCE DAILY   valACYclovir 500 MG tablet Commonly known as: VALTREX Take 1 tablet (500 mg total) by mouth 2 (two) times daily for 3 days.            Durable Medical Equipment  (From admission, onward)         Start     Ordered   04/18/19 1139  For home use only DME Walker rolling  [facility])  Once    Comments: Rolling walker with 5" wheels  Question:  Patient needs a walker to treat with the following condition  Answer:  Balance problem   04/18/19 1138         Allergies  Allergen Reactions   Barbiturates Other (See Comments)    Keeps pt awake   Methocarbamol Itching   Sulfonamide Derivatives Itching   Follow-up Information    [name], MD Follow up.   Specialty: Family Medicine Contact information: [place] [address] [phone]        Rolm Bookbinder, MD Follow up.   Specialty: General Surgery Contact information: [address] [place]  [address] [phone]        Magrinat, [name], MD Follow up.   Specialty: Oncology Contact information: [place] [address] [phone]        [facility] Follow up.   Contact information: [place] [address] [phone]            The results of significant diagnostics from this hospitalization (including imaging, microbiology, ancillary and laboratory) are listed below for reference.    Significant Diagnostic Studies: Dg Chest 2 View  Result Date: 04/16/2019 CLINICAL DATA:  Right flank and chest pain as well as right shoulder pain. Shortness of breath and weakness. Recent diagnosis of right breast cancer. EXAM: CHEST - 2 VIEW COMPARISON:  04/13/2019 and 11/14/2018  FINDINGS: Right IJ Port-A-Cath unchanged. Persistent moderate size right pleural effusion likely with associated right basilar atelectasis. Left lung is clear. Mild stable cardiomegaly. Remainder of the exam is unchanged. IMPRESSION: Stable moderate size right pleural effusion likely with associated right basilar compressive atelectasis. Mild stable cardiomegaly. Electronically Signed   By: Marin Olp M.D.   On: 04/16/2019 16:39  Dg Chest 2 View  Result Date: 04/13/2019 CLINICAL DATA:  48 year old with RIGHT mastectomy on 03/04/2019 for breast cancer, currently being admitted due to a RIGHT pleural effusion. EXAM: CHEST - 2 VIEW 5:30 p.m.: COMPARISON:  Portable chest x-ray earlier same day at 12:43 a.m. and previous chest x-rays. CT chest 04/11/2019 and earlier. FINDINGS: AP SEMI-ERECT and LATERAL images were obtained. Cardiac silhouette mildly enlarged even allowing for AP technique, unchanged. Large RIGHT pleural effusion with a subpulmonic component, unchanged since the examination earlier today, increased in size since yesterday. Associated passive atelectasis in the RIGHT LOWER LOBE. Mild linear atelectasis in the LEFT LOWER LOBE as noted on the recent CT. No new pulmonary parenchymal abnormalities. RIGHT jugular Port-A-Cath tip at or near the cavoatrial junction. Post surgical changes related to RIGHT mastectomy and axillary node removal. IMPRESSION: 1. Large RIGHT pleural effusion with a subpulmonic component, increased in size since yesterday. Associated passive atelectasis in the RIGHT LOWER LOBE. 2. Mild linear atelectasis in the LEFT LOWER LOBE as noted on the recent CT. 3. No new abnormalities. Electronically Signed   By: Evangeline Dakin M.D.   On: 04/13/2019 18:11   Dg Chest 2 View  Result Date: 04/10/2019 CLINICAL DATA:  Shortness of breath. Right breast carcinoma. Gastroesophageal reflux disease. EXAM: CHEST - 2 VIEW COMPARISON:  12/06/2018 FINDINGS: Heart size is normal. Right-sided  power port remains in place. New infiltrate or atelectasis is seen in both lung bases, right side greater than left. No evidence of pleural effusion. IMPRESSION: New bibasilar atelectasis versus infiltrates, right side greater than left. Electronically Signed   By: Marlaine Hind M.D.   On: 04/10/2019 08:11   Ct Angio Chest Pe W And/or Wo Contrast  Result Date: 04/11/2019 CLINICAL DATA:  48 year old female with history of right breast cancer and recent mastectomy presenting with shortness of breath. EXAM: CT ANGIOGRAPHY CHEST WITH CONTRAST TECHNIQUE: Multidetector CT imaging of the chest was performed using the standard protocol during bolus administration of intravenous contrast. Multiplanar CT image reconstructions and MIPs were obtained to evaluate the vascular anatomy. CONTRAST:  158mL OMNIPAQUE IOHEXOL 350 MG/ML SOLN COMPARISON:  Chest CT dated 03/26/2019 and radiograph dated 04/09/2019 and CT dated 11/15/2018 FINDINGS: Cardiovascular: Borderline cardiomegaly. No pericardial effusion. The thoracic aorta is unremarkable. The origins of the great vessels of the aortic arch are patent as visualized. Evaluation of the pulmonary arteries is limited due to respiratory motion artifact and suboptimal visualization of the peripheral branches. No large or central pulmonary artery embolus identified. Mediastinum/Nodes: There is a 6.7 x 5.6 cm soft tissue appearing mass in the right cardiophrenic angle with infiltration of the anterior chest wall and extension into the subpectoral region. This is new compared to the CT of 11/15/2018 and has increased in size since the study of 03/26/2019 where it measured approximately 4.0 x 3.7 cm by my measurements. This may represent a loculated malignant effusion versus a soft tissue metastasis. Although, the lobulated border and infiltration into the anterior chest wall suggest metastasis, significant interval increase in size over the course of 2 weeks is somewhat unusual. This can  be better evaluated with ultrasound or MRI. No definite mediastinal or hilar adenopathy identified. The esophagus is grossly unremarkable. Right pectoral Port-A-Cath with tip in the region of the right atrium noted. Lungs/Pleura: There is a moderate right pleural effusion with associated partial compressive atelectasis of the right lower lobe as well as areas of atelectatic changes in the right upper lobe. There has been interval increase in  the size of the pleural effusion and associated atelectasis since the prior CT. Linear left lung base atelectasis/scarring as well as a small left pleural effusion noted. There is no pneumothorax. The central airways are patent. Upper Abdomen: Ill-defined partially exophytic 3.2 x 2.7 cm lesion from the posterior right lobe of the liver as seen previously. There is a 4 mm right renal upper pole calculus. Musculoskeletal: Surgical clips in the right axilla and right mastectomy. Interval resolution of the right axillary fluid/seroma. Biopsy clip in the left breast. No acute osseous pathology. Review of the MIP images confirms the above findings. IMPRESSION: 1. No CT evidence of central pulmonary artery embolus. 2. Interval increase in the size of the right pleural effusion with associated partial compressive atelectasis of the right lower lobe. 3. Interval increase in the size of the right cardiophrenic angle soft tissue mass or loculated collection with infiltration of the anterior chest wall. This may represent a loculated malignant effusion versus a soft tissue metastasis. This can be better evaluated with ultrasound or MRI. 4. Ill-defined partially exophytic lesion from the posterior right lobe of the liver as seen on the prior CT. 5. Interval resolution of the right axillary fluid/seroma. Electronically Signed   By: Anner Crete M.D.   On: 04/11/2019 22:08   Ct Angio Chest Pe W Or Wo Contrast  Result Date: 03/26/2019 CLINICAL DATA:  Recent mastectomy for breast  carcinoma. Shortness of breath. EXAM: CT ANGIOGRAPHY CHEST WITH CONTRAST TECHNIQUE: Multidetector CT imaging of the chest was performed using the standard protocol during bolus administration of intravenous contrast. Multiplanar CT image reconstructions and MIPs were obtained to evaluate the vascular anatomy. CONTRAST:  33mL ISOVUE-370 IOPAMIDOL (ISOVUE-370) INJECTION 76% COMPARISON:  Chest CT November 15, 2018 FINDINGS: Cardiovascular: There is no demonstrable pulmonary embolus. There is no thoracic aortic aneurysm or dissection. The visualized great vessels appear unremarkable. Port-A-Cath tip is in the superior vena cava. There is no pericardial effusion or pericardial thickening evident. Mediastinum/Nodes: Thyroid appears unremarkable. There is no appreciable thoracic adenopathy. No esophageal lesions are evident. Lungs/Pleura: There is a fairly small pleural effusion on the right with a rather minimal pleural effusion on the left. There is consolidation with atelectasis in the posterior segments of each lower lobe. There is also mild consolidation in the inferior lingula. No pneumothorax. No pulmonary nodular lesions evident. Upper Abdomen: There is a mass arising eccentrically from the liver measuring 4.1 x 2.4 cm, an apparent metastatic lesion. Visualized upper abdominal structures otherwise appear unremarkable. Musculoskeletal: No blastic or lytic bone lesions are evident. There is postoperative change in the right breast at the site of recent mastectomy. There is a fluid collection within the right chest region anterior to the muscles in the area of mastectomy measuring 2.4 x 2.2 cm. This structure represents either a seroma or liquefying hematoma. Review of the MIP images confirms the above findings. IMPRESSION: 1. No demonstrable pulmonary embolus. No thoracic aortic aneurysm or dissection. 2. Small pleural effusion on the right with rather minimal pleural effusion on the left. Consolidation noted in both  posterior lung bases as well as in the inferior lingula. Suspect atelectasis with likely superimposed pneumonia in these areas. 3.  No thoracic adenopathy. 4. Mass arising eccentrically from the right lobe of the liver posteriorly, felt to represent a metastatic focus, also present on most recent CT. 5. Seroma versus liquefying hematoma and postoperative region right breast measuring 2.4 x 2.2 cm. Electronically Signed   By: Lowella Grip III M.D.  On: 03/26/2019 13:57   Dg Chest Port 1 View  Result Date: 04/13/2019 CLINICAL DATA:  48 year old female with decreased breath sounds. EXAM: PORTABLE CHEST 1 VIEW COMPARISON:  Chest radiograph dated 04/12/2019 FINDINGS: Right-sided Port-A-Cath in similar position. There is shallow inspiration with bibasilar atelectasis. Infiltrate is not excluded. Overall no significant interval change since the earlier radiograph. Probable small bilateral pleural effusions there is no pneumothorax. Stable cardiac silhouette. No acute osseous pathology. Right axillary surgical clips. IMPRESSION: Shallow inspiration with small bilateral pleural effusions and bibasilar atelectasis. Overall similar or slightly worsened since the prior radiograph. Electronically Signed   By: Anner Crete M.D.   On: 04/13/2019 01:13   Dg Chest Port 1 View  Result Date: 04/12/2019 CLINICAL DATA:  Post thoracentesis EXAM: PORTABLE CHEST 1 VIEW COMPARISON:  04/09/2019 FINDINGS: Slight interval reduction in volume of a right-sided pleural effusion. No significant pneumothorax. Otherwise unchanged examination with cardiomegaly and bandlike scarring or atelectasis of the left lung base. IMPRESSION: Slight interval reduction in volume of a right-sided pleural effusion. No significant pneumothorax. Otherwise unchanged examination with cardiomegaly and bandlike scarring or atelectasis of the left lung base. Electronically Signed   By: Eddie Candle M.D.   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 She presents for thoracentesis. EXAM: ULTRASOUND GUIDED RIGHT THORACENTESIS MEDICATIONS: None. COMPLICATIONS: None immediate. PROCEDURE: An ultrasound guided thoracentesis was thoroughly discussed with the patient and questions answered. The benefits, risks, alternatives and complications were also discussed. The patient understands and wishes to proceed with the procedure. Written consent was obtained. Ultrasound was performed to localize and mark an adequate pocket of fluid in the right chest. The area was then prepped and draped in the normal sterile fashion. 1% Lidocaine was used for local anesthesia. Under ultrasound guidance a 6 Fr Safe-T-Centesis catheter was introduced. Thoracentesis was performed. The catheter was removed and a dressing applied. FINDINGS: A total of approximately 500 mL of bloody pleural fluid was removed. Samples were sent to the laboratory as requested by the clinical team. IMPRESSION: Successful ultrasound guided right thoracentesis yielding 500 mL of pleural fluid. Electronically Signed   By: Jacqulynn Cadet M.D.   On: 04/12/2019 13:09    Microbiology: Recent Results (from the past 240 hour(s))  SARS Coronavirus 2 (CEPHEID - Performed in [facility] hospital lab), Hosp Order     Status: None   Collection Time: 04/11/19  8:28 PM   Specimen: Nasopharyngeal Swab  Result Value Ref Range Status   SARS Coronavirus 2 NEGATIVE NEGATIVE Final    Comment: (NOTE) If result is NEGATIVE SARS-CoV-2 target nucleic acids are NOT DETECTED. The SARS-CoV-2 RNA is generally detectable in upper and lower  respiratory specimens during the acute phase of infection. The lowest  concentration of SARS-CoV-2 viral copies this assay can detect is 250  copies / mL. A negative result does not preclude SARS-CoV-2 infection  and  should not be used as the sole basis for treatment or other  patient management decisions.  A negative result may occur with  improper specimen collection / handling, submission of specimen other  than nasopharyngeal swab, presence of viral mutation(s) within the  areas targeted by this assay, and inadequate number of viral copies  (<250 copies / mL). A negative result must be combined with clinical  observations, patient history, and epidemiological information. If result is POSITIVE SARS-CoV-2 target  nucleic acids are DETECTED. The SARS-CoV-2 RNA is generally detectable in upper and lower  respiratory specimens dur ing the acute phase of infection.  Positive  results are indicative of active infection with SARS-CoV-2.  Clinical  correlation with patient history and other diagnostic information is  necessary to determine patient infection status.  Positive results do  not rule out bacterial infection or co-infection with other viruses. If result is PRESUMPTIVE POSTIVE SARS-CoV-2 nucleic acids MAY BE PRESENT.   A presumptive positive result was obtained on the submitted specimen  and confirmed on repeat testing.  While 2019 novel coronavirus  (SARS-CoV-2) nucleic acids may be present in the submitted sample  additional confirmatory testing may be necessary for epidemiological  and / or clinical management purposes  to differentiate between  SARS-CoV-2 and other Sarbecovirus currently known to infect humans.  If clinically indicated additional testing with an alternate test  methodology [phone]) is advised. The SARS-CoV-2 RNA is generally  detectable in upper and lower respiratory sp ecimens during the acute  phase of infection. The expected result is Negative. Fact Sheet for Patients:  StrictlyIdeas.no Fact Sheet for Healthcare Providers: BankingDealers.co.za This test is not yet approved or cleared by the [place] FDA and has been  authorized for detection and/or diagnosis of SARS-CoV-2 by FDA under an Emergency Use Authorization (EUA).  This EUA will remain in effect (meaning this test can be used) for the duration of the COVID-19 declaration under Section 564(b)(1) of the Act, 21 U.S.C. section 360bbb-3(b)(1), unless the authorization is terminated or revoked sooner. Performed at [facility], [place] [address], [place], [place] [address]   Blood culture (routine x 2)     Status: None   Collection Time: 04/11/19  8:28 PM   Specimen: BLOOD  Result Value Ref Range Status   Specimen Description   Final    BLOOD Performed at [facility] [address], [place], [place] [address]    Special Requests   Final    BOTTLES DRAWN AEROBIC AND ANAEROBIC Blood Culture results may not be optimal due to an excessive volume of blood received in culture bottles Performed at [facility] [address], [place], [place] [address]    Culture   Final    NO GROWTH 5 DAYS Performed at [facility], [place] [address] [place], [place] [address]    Report Status 04/17/2019 FINAL  Final  Blood culture (routine x 2)     Status: None   Collection Time: 04/12/19  6:56 AM   Specimen: BLOOD LEFT HAND  Result Value Ref Range Status   Specimen Description   Final    BLOOD LEFT HAND Performed at [facility] [address] [place], [place] [address]    Special Requests   Final    BOTTLES DRAWN AEROBIC ONLY Blood Culture adequate volume Performed at [facility] [address] [place], [place] [address]    Culture   Final    NO GROWTH 5 DAYS Performed at [facility], [place] [address], [place], [place] [address]    Report Status 04/17/2019 FINAL  Final  Gram stain     Status: None   Collection Time: 04/12/19 12:17 PM   Specimen: Pleura  Result Value Ref Range Status   Specimen Description PLEURAL  Final   Special Requests  NONE  Final   Gram Stain   Final    ABUNDANT WBC PRESENT, PREDOMINANTLY PMN  NO ORGANISMS SEEN Performed at [facility], [place] [address], [place], [place] [address]    Report Status 04/13/2019 FINAL  Final  Culture, body fluid-bottle     Status: None   Collection Time: 04/12/19 12:17 PM   Specimen: Pleura  Result Value Ref Range Status   Specimen Description PLEURAL  Final   Special Requests NONE  Final   Culture   Final    NO GROWTH 5 DAYS Performed at [facility] [address] [place], [place] [address]    Report Status 04/17/2019 FINAL  Final  MRSA PCR Screening     Status: None   Collection Time: 04/14/19  1:19 AM   Specimen: Nasal Mucosa; Nasopharyngeal  Result Value Ref Range Status   MRSA by PCR NEGATIVE NEGATIVE Final    Comment:        The GeneXpert MRSA Assay (FDA approved for NASAL specimens only), is one component of a comprehensive MRSA colonization surveillance program. It is not intended to diagnose MRSA infection nor to guide or monitor treatment for MRSA infections. Performed at [facility], [place] [address], [place], [place] [address]   Culture, blood (routine x 2)     Status: None (Preliminary result)   Collection Time: 04/14/19  1:50 AM   Specimen: BLOOD  Result Value Ref Range Status   Specimen Description   Final    BLOOD LEFT ANTECUBITAL Performed at [facility] [address], [place], [place] [address]    Special Requests   Final    BOTTLES DRAWN AEROBIC ONLY Blood Culture adequate volume Performed at [facility] [address], [place], [place] [address]    Culture   Final    NO GROWTH 4 DAYS Performed at [facility], [place] [address] [place], [place] [address]    Report Status PENDING  Incomplete  Culture, blood (routine x 2)     Status: None (Preliminary result)   Collection Time: 04/14/19  1:50 AM   Specimen: BLOOD LEFT HAND  Result Value Ref Range  Status   Specimen Description   Final    BLOOD LEFT HAND Performed at [facility], [place] [address] [place], [place] [address]    Special Requests   Final    BOTTLES DRAWN AEROBIC ONLY Blood Culture adequate volume Performed at [facility] [address], [place], [place] [address]    Culture   Final    NO GROWTH 4 DAYS Performed at [facility], [place] [address], [place],  [address]    Report Status PENDING  Incomplete     Labs: Basic Metabolic Panel: Recent Labs  Lab 04/12/19 0356 04/13/19 0511 04/14/19 0150 04/15/19 0403 04/16/19 0408  NA 139 135 140 137 139  K 3.4* 3.6 3.8 3.4* 3.8  CL 103 99 104 102 105  CO2 24 25 25 26 26   GLUCOSE 110* 115* 115* 117* 115*  BUN 8 18 13 10 10   CREATININE 0.68 0.93 0.84 0.68 0.64  CALCIUM 8.9 9.4 8.9 8.9 9.1  MG  --  2.0 1.7 1.7 1.9   Liver Function Tests: Recent Labs  Lab 04/12/19 0356 04/13/19 0511 04/14/19 0150 04/15/19 0403 04/16/19 0408  AST 11* 10* 12* 12* 12*  ALT 30 27 28 25 25   ALKPHOS 93 100 97 90 95  BILITOT 0.4 0.6 0.4 0.2* 0.4  PROT 6.8 7.4 6.9 6.4* 6.5  ALBUMIN 3.3* 3.6 3.3*  3.0* 3.1*   No results for input(s): LIPASE, AMYLASE in the last 168 hours. No results for input(s): AMMONIA in the last 168 hours. CBC: Recent Labs  Lab 04/11/19 2028  04/12/19 0356 04/13/19 0511 04/14/19 0150 04/15/19 0403 04/16/19 0408  WBC 9.6  --  8.8 9.1 6.2 5.6 5.7  NEUTROABS 6.4  --   --   --   --   --   --   HGB 10.2*   < > 9.8* 10.5* 10.3* 9.1* 9.0*  HCT 33.5*   < > 32.9* 34.2* 35.6* 31.3* 30.8*  MCV 90.3  --  89.6 88.8 91.3 89.2 90.3  PLT 423*  --  399 377 352 327 360   < > = values in this interval not displayed.   Cardiac Enzymes: No results for input(s): CKTOTAL, CKMB, CKMBINDEX, TROPONINI in the last 168 hours. BNP: BNP (last 3 results) No results for input(s): BNP in the last 8760 hours.  ProBNP (last 3 results) No results for input(s): PROBNP in the last 8760  hours.  CBG: No results for input(s): GLUCAP in the last 168 hours.     Signed:  Florencia Reasons MD, PhD  [facility] 04/18/2019, 1:38 PM

## 2019-04-18 NOTE — Telephone Encounter (Signed)
Spoke with patient. She stated that we need to call  [phone] to extend her FMLA paper work.

## 2019-04-19 LAB — CULTURE, BLOOD (ROUTINE X 2)
Culture: NO GROWTH
Culture: NO GROWTH
Special Requests: ADEQUATE
Special Requests: ADEQUATE

## 2019-04-21 ENCOUNTER — Other Ambulatory Visit: Payer: Self-pay | Admitting: Oncology

## 2019-04-21 ENCOUNTER — Ambulatory Visit
Admit: 2019-04-21 | Discharge: 2019-04-21 | Disposition: A | Discharge status: Home / Self Care | Payer: BC Managed Care – PPO | Attending: Radiation Oncology | Admitting: Radiation Oncology

## 2019-04-21 ENCOUNTER — Ambulatory Visit: Admit: 2019-04-21 | Payer: BC Managed Care – PPO

## 2019-04-21 NOTE — Progress Notes (Signed)
Location of Breast Cancer: Right Breast  Histology per Pathology Report:  10/29/18 Diagnosis 1. Breast, right, needle core biopsy, 8 o'clock - INVASIVE DUCTAL CARCINOMA, GRADE 3. SEE NOTE. - DUCTAL CARCINOMA IN SITU, HIGH GRADE. 2. Lymph node, needle/core biopsy, right axilla - LYMPH NODE, NEGATIVE FOR CARCINOMA  Receptor Status: ER(NEG), PR (NEG), Her2-neu (NEG), Ki-(80%)  03/04/19 Diagnosis 1. 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 1 of 4 - CARCINOMA INVOLVES SKELETAL MUSCLE AND GROSSLY INVOLVES OVERLYING SKIN. - DUCTAL CARCINOMA IN SITU, HIGH GRADE. - CARCINOMA IS BROADLY LESS THAN 0.1 CM TO THE POSTERIOR MARGIN. - SEE ONCOLOGY TABLE BELOW. 2. Lymph node, sentinel, biopsy, Right - METASTATIC CARCINOMA IN 1 OF 1 LYMPH NODE (1/1). 3. Lymph node, sentinel, biopsy, Right - THERE IS NO EVIDENCE OF CARCINOMA IN 1 OF 1 LYMPH NODE (0/1). 4. Lymph nodes, regional resection, Right - THERE IS NO EVIDENCE OF CARCINOMA IN 4 OF 4 LYMPH NODES (0/4). 5. Fatty tissue, Right - BENIGN BREAST PARENCHYMA. - THERE IS NO EVIDENCE OF MALIGNANCY   Did patient present with symptoms or was this found on screening mammography?: She noted a palpable right breast lump with tenderness in February 2020  Past/Anticipated interventions by surgeon, if any: 03/04/19 Procedure: 1. Right mastectomy 2. Right deep axillary sentinel node biopsy 3. Injection blue dye for sentinel node identification Surgeon: Dr Serita Grammes  Past/Anticipated interventions by medical oncology, if any:  04/02/19 [name] (1) neoadjuvant chemotherapy consisting of doxorubicin and cyclophosphamide in dose dense fashion x4, started 11/26/2018, completed 01/07/2019, followed by weekly paclitaxel and carboplatin x12 starting 01/28/2019 (2) patient desires ovarian function preservation:              (a) Zoladex started 11/08/2018, discontinued 02/04/2019             (b) patient is  status post bilateral tubal ligation (3) status post right mastectomy with sentinel lymph node sampling 03/04/2019 for a [id number] invasive ductal carcinoma grade 3, repeat prognostic panel again triple negative             (a) a total of 6 axillary lymph nodes removed, 2 sentinel ones, 1 positive (4) adjuvant radiation pending   Lymphedema issues, if any:   N/A  Pain issues, if any:    SAFETY ISSUES:  Prior radiation? No  Pacemaker/ICD? No  Possible current pregnancy? S/P tubal ligation  Is the patient on methotrexate? No  Current Complaints / other details:   She was admitted to the hospital on 04/11/19 for shortness of breath and tachycardia. A CT scan showed a pleural effusion.  She underwent a thoracentesis on 04/12/19 with 500 cc of pleural fluid removed. Cytology is pending.  She also has a right cardiophrenic angle soft tissue mass.  Dr. Benay Spice note from 04/14/19 when he saw her in the hospital for [name]. Recommendations: 1.  Follow-up pleural fluid cytology, place right Pleurx if a malignant effusion is confirmed 2.  Hold on planned right chest wall radiation until further discussion regarding a different systemic chemotherapy regimen, potentially to be given concurrent with radiation 3.  Continue outpatient pembrolizumab  4.  Narcotic analgesics as needed for pain    Brittany Rodgers, Brittany Police, RN 04/21/2019,12:40 PM

## 2019-04-22 ENCOUNTER — Ambulatory Visit
Admission: RE | Admit: 2019-04-22 | Discharge: 2019-04-22 | Disposition: A | Discharge status: Home / Self Care | Payer: BC Managed Care – PPO | Source: Ambulatory Visit | Attending: Radiation Oncology | Admitting: Radiation Oncology

## 2019-04-22 ENCOUNTER — Other Ambulatory Visit: Payer: Self-pay | Admitting: Oncology

## 2019-04-22 ENCOUNTER — Encounter: Payer: Self-pay | Admitting: Radiation Oncology

## 2019-04-22 ENCOUNTER — Other Ambulatory Visit: Payer: Self-pay

## 2019-04-22 DIAGNOSIS — C50511 Malignant neoplasm of lower-outer quadrant of right female breast: Secondary | ICD-10-CM

## 2019-04-22 NOTE — Progress Notes (Signed)
Spoke with Dr. Isidore Moos today regarding Brittany Rodgers.  She is very concerned regarding the 6.7 cm mass and the right cardiophrenic angle, which appears to be infiltrating the anterior chest wall.  This was not present in February.  It has increased in size.  We are going to need to biopsy this.  If it is a loculated effusion we can certainly drain it.  If it is a soft tissue mass we can do IMRT at the same time as we do the chest wall postop radiation.  We also will need to further evaluate the liver lesion.  Possibly a liver MRI would give [name] the answer without the need for biopsy or we could biopsy that as well.

## 2019-04-22 NOTE — Telephone Encounter (Signed)
Called the number that was given. It is a call center for employees and I was unable to get in touch with a person. I do not have the necessary information to complete the prompts. Will call patient back.

## 2019-04-22 NOTE — Progress Notes (Signed)
Radiation Oncology         [phone]) [phone] ________________________________  Name: Brittany Rodgers MRN: [id number]  Date: 04/22/2019  DOB: [birth year]  Follow-Up New Visit Note  (no-show, no charge) Outpatient  CC: [name], MD  Magrinat, [name], MD  Diagnosis:   No diagnosis found.  Cancer Staging Malignant neoplasm of lower-outer quadrant of right breast of female, estrogen receptor negative ([place]) Staging form: Breast, AJCC 8th Edition - Clinical stage from 11/06/2018: Stage IIB (cT2, cN0, cM0, G3, ER-, PR-, HER2-) - Unsigned  Pathologic Stage [id number] Right Breast LOQ Invasive Ductal Carcinoma, ER(-) / PR(-) / Her2(-), Grade 3  CHIEF COMPLAINT: None, patient did not show for appointment  Narrative:    Since consultation date, she underwent the following imaging (dates and results as follows):  Bilateral Breast MRI (11/12/2018):  1. 5.4 x 4.6 x 3.9 cm biopsy-proven malignancy in the posterior aspect of the lower outer quadrant of the right breast. This is invading the anterior aspect of the underlying pectoralis muscle and has a significant necrotic component.  2. Additional non mass enhancement involving all 4 quadrants of the right breast, extending from the anterior to the posterior aspect of the breast. The non mass enhancement and biopsy-proven malignancy involve a combined area measuring 9.6 x 6.8 x 4.9 cm. The non mass enhancement is concerning four additional ductal carcinoma in situ. 3. Single abnormal appearing right axillary lymph node, corresponding to the recently biopsied lymph node with benign results. Despite the recent benign biopsy results, this is suspicious for a possible metastatic node. 4. No evidence of malignancy the on the left.  CT Chest (11/15/2018):  1. Posterior right breast 5.6 cm mass compatible with primary right breast malignancy. 2. Right axillary lymphadenopathy compatible with nodal metastatic disease. 3. Two poorly marginated hypodense liver  masses, indeterminate, suspicious for liver metastases. Further staging evaluation with CT abdomen/pelvis with oral and IV contrast suggested. PET-CT or MRI abdomen without and with IV contrast may also be considered, as clinically warranted. 4. No pulmonary nodules.  Bone scan (11/15/2018): No scintigraphic evidence of osseous metastatic disease.  Ultrasound-guided biopsy of the liver lesion on 11/25/2018 revealed: Hemangioma.  Systemic therapy, if applicable, involved (dates and therapy as follows): Neoadjuvant chemotherapy consisting of doxorubicin and cyclophosphamide in dose dense fashion x4, started 11/26/2018, completed 01/07/2019, followed by weekly paclitaxel and carboplatin x12 starting 01/28/2019. Pembrolizumab started 04/02/2019, repeated every 21 days.  Patient desires ovarian function preservation:  (a) Zoladex started 11/08/2018, discontinued 02/04/2019. (b) patient is status post bilateral tubal ligation.  Genetics counseling on 11/28/2018; testing was recommended but declined by patient.  Bilateral Breast MRI (01/31/2019):  1. Enlarging biopsy-proven RIGHT breast malignancy, now measuring 10 x 7.5 x 8 cm, previously 5.4 x 4.6 x 3.9 cm and now extends to the UPPER-OUTER RIGHT breast skin. Continued contact with the RIGHT pectoralis muscle. 2. Unchanged 7 mm RIGHT internal mammary lymph node. Previously identified prominent RIGHT axillary lymph nodes now have a normal appearance. 3. 1.2 cm linear non masslike enhancement within the UPPER INNER LEFT breast - DCIS is not excluded. Recommend MR biopsy if this would change treatment.  MRI-guided biopsy of the upper inner left breast on 02/10/2019 revealed: Duct ectasia, microglandular adenosis and fibrocystic changes. Focal pseudoangiomatous stromal hyperplasia. No malignancy identified.  Right mastectomy with right axillary sentinel lymph node biopsy on date of 03/04/2019 revealed: Invasive ductal carcinoma, spanning 10.5 cm, involving  skeletal muscle and grossly involving overlying skin. Carcinoma was broadly less than  0.1 cm to the posterior margin. High grade DCIS present. Metastatic carcinoma in 1/6 lymph nodes.  ER status: negative, PR status: negative, Her2 status: negative; Grade 3.   She developed shortness of breath and underwent CT Angiogram of the Chest on 03/26/2019: 1. No demonstrable pulmonary embolus. No thoracic aortic aneurysm or dissection. 2. Small pleural effusion on the right with rather minimal pleural effusion on the left. Consolidation noted in both posterior lung bases as well as in the inferior lingula. Suspect atelectasis with likely superimposed pneumonia in these areas. 3.  No thoracic adenopathy. 4. Mass arising eccentrically from the right lobe of the liver posteriorly, felt to represent a metastatic focus, also present on most recent CT. 5. Seroma versus liquefying hematoma and postoperative region right breast measuring 2.4 x 2.2 cm.  Repeat CT Angiogram of the Chest on 04/11/2019 showed: 1. No CT evidence of central pulmonary artery embolus. 2. Interval increase in the size of the right pleural effusion with associated partial compressive atelectasis of the right lower lobe. 3. Interval increase in the size of the right cardiophrenic angle soft tissue mass or loculated collection with infiltration of the anterior chest wall. This may represent a loculated malignant effusion versus a soft tissue metastasis. This can be better evaluated with ultrasound or MRI. 4. Ill-defined partially exophytic lesion from the posterior right lobe of the liver as seen on the prior CT. 5. Interval resolution of the right axillary fluid/seroma.  During the patient's hospitalization in July pleural fluid was collected showing atypical cells.  The patient has been referred back today for discussion of adjuvant radiation treatment.  Yesterday the patient no-showed and therefore she was rescheduled for a telemedicine  follow-up today.  Unfortunately when we called her she did not answer.  She later called my nurse and apologized stating that she could talk with me later today but I have been unable to reach her this afternoon.  Certainly she has been under tremendous stress and we will hopefully connect with her soon; the above history was gathered before her follow-up and I will leave this in the chart for future reference        ALLERGIES:  is allergic to barbiturates; methocarbamol; and sulfonamide derivatives.  Meds: Current Outpatient Medications  Medication Sig Dispense Refill   atorvastatin (LIPITOR) 20 MG tablet Take 1 tablet (20 mg total) by mouth daily. 90 tablet 3   cyclobenzaprine (FLEXERIL) 10 MG tablet Take 1 tablet (10 mg total) by mouth 3 (three) times daily. 30 tablet 2   gabapentin (NEURONTIN) 300 MG capsule Take 1 capsule (300 mg total) by mouth 3 (three) times daily. 90 capsule 1   lidocaine-prilocaine (EMLA) cream Apply to affected area once (Patient taking differently: Apply 1 application topically daily as needed (numbing). ) 30 g 3   loratadine (CLARITIN) 10 MG tablet Take 1 tablet (10 mg total) by mouth daily. (Patient not taking: Reported on 04/11/2019) 60 tablet 0   metoprolol succinate (TOPROL-XL) 50 MG 24 hr tablet Take 1 tablet (50 mg total) by mouth daily. Take with or immediately following a meal. 30 tablet 1   nystatin (MYCOSTATIN) 100000 UNIT/ML suspension Take 5 mLs (500,000 Units total) by mouth 4 (four) times daily. 60 mL 0   Omeprazole-Sodium Bicarbonate (ZEGERID) 20-1100 MG CAPS capsule Take 1 capsule by mouth daily before breakfast. 90 each 3   oxyCODONE (OXY IR/ROXICODONE) 5 MG immediate release tablet Take 1 tablet (5 mg total) by mouth every 4 (four) hours as needed  for moderate pain. 15 tablet 0   polyethylene glycol (MIRALAX / GLYCOLAX) packet Take 17 g by mouth as needed. (Patient taking differently: Take 17 g by mouth daily as needed (constipation). Mix in 8  oz liquid and drink) 100 each 5   potassium chloride (K-DUR) 10 MEQ tablet TAKE 1 TABLET BY MOUTH ONCE DAILY (Patient taking differently: Take 10 mEq by mouth daily. ) 90 tablet 3   No current facility-administered medications for this encounter.     Physical Findings:  vitals were not taken for this visit. .      Lab Findings: Lab Results  Component Value Date   WBC 5.7 04/16/2019   HGB 9.0 (L) 04/16/2019   HCT 30.8 (L) 04/16/2019   MCV 90.3 04/16/2019   PLT 360 04/16/2019    @LASTCHEMISTRY @  Radiographic Findings: Dg Chest 2 View  Result Date: 04/16/2019 CLINICAL DATA:  Right flank and chest pain as well as right shoulder pain. Shortness of breath and weakness. Recent diagnosis of right breast cancer. EXAM: CHEST - 2 VIEW COMPARISON:  04/13/2019 and 11/14/2018 FINDINGS: Right IJ Port-A-Cath unchanged. Persistent moderate size right pleural effusion likely with associated right basilar atelectasis. Left lung is clear. Mild stable cardiomegaly. Remainder of the exam is unchanged. IMPRESSION: Stable moderate size right pleural effusion likely with associated right basilar compressive atelectasis. Mild stable cardiomegaly. Electronically Signed   By: Marin Olp M.D.   On: 04/16/2019 16:39   Dg Chest 2 View  Result Date: 04/13/2019 CLINICAL DATA:  48 year old with RIGHT mastectomy on 03/04/2019 for breast cancer, currently being admitted due to a RIGHT pleural effusion. EXAM: CHEST - 2 VIEW 5:30 p.m.: COMPARISON:  Portable chest x-ray earlier same day at 12:43 a.m. and previous chest x-rays. CT chest 04/11/2019 and earlier. FINDINGS: AP SEMI-ERECT and LATERAL images were obtained. Cardiac silhouette mildly enlarged even allowing for AP technique, unchanged. Large RIGHT pleural effusion with a subpulmonic component, unchanged since the examination earlier today, increased in size since yesterday. Associated passive atelectasis in the RIGHT LOWER LOBE. Mild linear atelectasis in the LEFT  LOWER LOBE as noted on the recent CT. No new pulmonary parenchymal abnormalities. RIGHT jugular Port-A-Cath tip at or near the cavoatrial junction. Post surgical changes related to RIGHT mastectomy and axillary node removal. IMPRESSION: 1. Large RIGHT pleural effusion with a subpulmonic component, increased in size since yesterday. Associated passive atelectasis in the RIGHT LOWER LOBE. 2. Mild linear atelectasis in the LEFT LOWER LOBE as noted on the recent CT. 3. No new abnormalities. Electronically Signed   By: Evangeline Dakin M.D.   On: 04/13/2019 18:11   Dg Chest 2 View  Result Date: 04/10/2019 CLINICAL DATA:  Shortness of breath. Right breast carcinoma. Gastroesophageal reflux disease. EXAM: CHEST - 2 VIEW COMPARISON:  12/06/2018 FINDINGS: Heart size is normal. Right-sided power port remains in place. New infiltrate or atelectasis is seen in both lung bases, right side greater than left. No evidence of pleural effusion. IMPRESSION: New bibasilar atelectasis versus infiltrates, right side greater than left. Electronically Signed   By: Marlaine Hind M.D.   On: 04/10/2019 08:11   Ct Angio Chest Pe W And/or Wo Contrast  Result Date: 04/11/2019 CLINICAL DATA:  48 year old female with history of right breast cancer and recent mastectomy presenting with shortness of breath. EXAM: CT ANGIOGRAPHY CHEST WITH CONTRAST TECHNIQUE: Multidetector CT imaging of the chest was performed using the standard protocol during bolus administration of intravenous contrast. Multiplanar CT image reconstructions and MIPs were obtained to  evaluate the vascular anatomy. CONTRAST:  126m OMNIPAQUE IOHEXOL 350 MG/ML SOLN COMPARISON:  Chest CT dated 03/26/2019 and radiograph dated 04/09/2019 and CT dated 11/15/2018 FINDINGS: Cardiovascular: Borderline cardiomegaly. No pericardial effusion. The thoracic aorta is unremarkable. The origins of the great vessels of the aortic arch are patent as visualized. Evaluation of the pulmonary  arteries is limited due to respiratory motion artifact and suboptimal visualization of the peripheral branches. No large or central pulmonary artery embolus identified. Mediastinum/Nodes: There is a 6.7 x 5.6 cm soft tissue appearing mass in the right cardiophrenic angle with infiltration of the anterior chest wall and extension into the subpectoral region. This is new compared to the CT of 11/15/2018 and has increased in size since the study of 03/26/2019 where it measured approximately 4.0 x 3.7 cm by my measurements. This may represent a loculated malignant effusion versus a soft tissue metastasis. Although, the lobulated border and infiltration into the anterior chest wall suggest metastasis, significant interval increase in size over the course of 2 weeks is somewhat unusual. This can be better evaluated with ultrasound or MRI. No definite mediastinal or hilar adenopathy identified. The esophagus is grossly unremarkable. Right pectoral Port-A-Cath with tip in the region of the right atrium noted. Lungs/Pleura: There is a moderate right pleural effusion with associated partial compressive atelectasis of the right lower lobe as well as areas of atelectatic changes in the right upper lobe. There has been interval increase in the size of the pleural effusion and associated atelectasis since the prior CT. Linear left lung base atelectasis/scarring as well as a small left pleural effusion noted. There is no pneumothorax. The central airways are patent. Upper Abdomen: Ill-defined partially exophytic 3.2 x 2.7 cm lesion from the posterior right lobe of the liver as seen previously. There is a 4 mm right renal upper pole calculus. Musculoskeletal: Surgical clips in the right axilla and right mastectomy. Interval resolution of the right axillary fluid/seroma. Biopsy clip in the left breast. No acute osseous pathology. Review of the MIP images confirms the above findings. IMPRESSION: 1. No CT evidence of central pulmonary  artery embolus. 2. Interval increase in the size of the right pleural effusion with associated partial compressive atelectasis of the right lower lobe. 3. Interval increase in the size of the right cardiophrenic angle soft tissue mass or loculated collection with infiltration of the anterior chest wall. This may represent a loculated malignant effusion versus a soft tissue metastasis. This can be better evaluated with ultrasound or MRI. 4. Ill-defined partially exophytic lesion from the posterior right lobe of the liver as seen on the prior CT. 5. Interval resolution of the right axillary fluid/seroma. Electronically Signed   By: AAnner CreteM.D.   On: 04/11/2019 22:08   Ct Angio Chest Pe W Or Wo Contrast  Result Date: 03/26/2019 CLINICAL DATA:  Recent mastectomy for breast carcinoma. Shortness of breath. EXAM: CT ANGIOGRAPHY CHEST WITH CONTRAST TECHNIQUE: Multidetector CT imaging of the chest was performed using the standard protocol during bolus administration of intravenous contrast. Multiplanar CT image reconstructions and MIPs were obtained to evaluate the vascular anatomy. CONTRAST:  783mISOVUE-370 IOPAMIDOL (ISOVUE-370) INJECTION 76% COMPARISON:  Chest CT November 15, 2018 FINDINGS: Cardiovascular: There is no demonstrable pulmonary embolus. There is no thoracic aortic aneurysm or dissection. The visualized great vessels appear unremarkable. Port-A-Cath tip is in the superior vena cava. There is no pericardial effusion or pericardial thickening evident. Mediastinum/Nodes: Thyroid appears unremarkable. There is no appreciable thoracic adenopathy. No esophageal lesions  are evident. Lungs/Pleura: There is a fairly small pleural effusion on the right with a rather minimal pleural effusion on the left. There is consolidation with atelectasis in the posterior segments of each lower lobe. There is also mild consolidation in the inferior lingula. No pneumothorax. No pulmonary nodular lesions evident. Upper  Abdomen: There is a mass arising eccentrically from the liver measuring 4.1 x 2.4 cm, an apparent metastatic lesion. Visualized upper abdominal structures otherwise appear unremarkable. Musculoskeletal: No blastic or lytic bone lesions are evident. There is postoperative change in the right breast at the site of recent mastectomy. There is a fluid collection within the right chest region anterior to the muscles in the area of mastectomy measuring 2.4 x 2.2 cm. This structure represents either a seroma or liquefying hematoma. Review of the MIP images confirms the above findings. IMPRESSION: 1. No demonstrable pulmonary embolus. No thoracic aortic aneurysm or dissection. 2. Small pleural effusion on the right with rather minimal pleural effusion on the left. Consolidation noted in both posterior lung bases as well as in the inferior lingula. Suspect atelectasis with likely superimposed pneumonia in these areas. 3.  No thoracic adenopathy. 4. Mass arising eccentrically from the right lobe of the liver posteriorly, felt to represent a metastatic focus, also present on most recent CT. 5. Seroma versus liquefying hematoma and postoperative region right breast measuring 2.4 x 2.2 cm. Electronically Signed   By: Lowella Grip III M.D.   On: 03/26/2019 13:57   Dg Chest Port 1 View  Result Date: 04/13/2019 CLINICAL DATA:  48 year old female with decreased breath sounds. EXAM: PORTABLE CHEST 1 VIEW COMPARISON:  Chest radiograph dated 04/12/2019 FINDINGS: Right-sided Port-A-Cath in similar position. There is shallow inspiration with bibasilar atelectasis. Infiltrate is not excluded. Overall no significant interval change since the earlier radiograph. Probable small bilateral pleural effusions there is no pneumothorax. Stable cardiac silhouette. No acute osseous pathology. Right axillary surgical clips. IMPRESSION: Shallow inspiration with small bilateral pleural effusions and bibasilar atelectasis. Overall similar or  slightly worsened since the prior radiograph. Electronically Signed   By: Anner Crete M.D.   On: 04/13/2019 01:13   Dg Chest Port 1 View  Result Date: 04/12/2019 CLINICAL DATA:  Post thoracentesis EXAM: PORTABLE CHEST 1 VIEW COMPARISON:  04/09/2019 FINDINGS: Slight interval reduction in volume of a right-sided pleural effusion. No significant pneumothorax. Otherwise unchanged examination with cardiomegaly and bandlike scarring or atelectasis of the left lung base. IMPRESSION: Slight interval reduction in volume of a right-sided pleural effusion. No significant pneumothorax. Otherwise unchanged examination with cardiomegaly and bandlike scarring or atelectasis of the left lung base. Electronically Signed   By: Eddie Candle M.D.   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 She presents for thoracentesis. EXAM: ULTRASOUND GUIDED RIGHT THORACENTESIS MEDICATIONS: None. COMPLICATIONS: None immediate. PROCEDURE: An ultrasound guided thoracentesis was thoroughly discussed with the patient and questions answered. The benefits, risks, alternatives and complications were also discussed. The patient understands and wishes to proceed with the procedure. Written consent was obtained. Ultrasound was performed to localize and mark an adequate pocket of fluid in the right chest. The area was then prepped and draped in the normal sterile fashion. 1% Lidocaine was used for local anesthesia. Under ultrasound guidance a 6 Fr Safe-T-Centesis catheter was introduced. Thoracentesis was performed. The catheter was removed and a dressing applied. FINDINGS: A total of  approximately 500 mL of bloody pleural fluid was removed. Samples were sent to the laboratory as requested by the clinical team. IMPRESSION: Successful ultrasound guided right thoracentesis  yielding 500 mL of pleural fluid. Electronically Signed   By: Jacqulynn Cadet M.D.   On: 04/12/2019 13:09    Impression/Plan: Right Breast Cancer   No charges should be made for this visit.  I spoke with [name] extensively about this patient after reviewing her scans.  I am concerned that she may have metastatic disease in her liver and her right anterior chest wall.  [name] will see her tomorrow and he will order a biopsy of the chest wall mass.  For now we will hold radiotherapy.  It is possible that she will need salvage systemic therapy; we will touch base after the biopsy results to come up with a strategy for her.  _____________________________________   [name], MD  This document serves as a record of services personally performed by [name], MD. It was created on her behalf by Rae Lips, a trained medical scribe. The creation of this record is based on the scribe's personal observations and the provider's statements to them. This document has been checked and approved by the attending provider.

## 2019-04-22 NOTE — Telephone Encounter (Signed)
Spoke with the patient. She stated we can fax over a formal letter. She will call back with the information that needs to be included in the letter.

## 2019-04-23 ENCOUNTER — Inpatient Hospital Stay (HOSPITAL_COMMUNITY)
Admission: EM | Admit: 2019-04-23 | Discharge: 2019-05-12 | DRG: 843 | Disposition: A | Discharge status: Home / Self Care | Payer: BC Managed Care – PPO | Attending: Internal Medicine | Admitting: Internal Medicine

## 2019-04-23 ENCOUNTER — Other Ambulatory Visit: Payer: Self-pay

## 2019-04-23 ENCOUNTER — Inpatient Hospital Stay: Payer: BC Managed Care – PPO

## 2019-04-23 ENCOUNTER — Encounter: Payer: Self-pay | Admitting: Adult Health

## 2019-04-23 ENCOUNTER — Emergency Department (HOSPITAL_COMMUNITY): Payer: BC Managed Care – PPO

## 2019-04-23 ENCOUNTER — Inpatient Hospital Stay (HOSPITAL_BASED_OUTPATIENT_CLINIC_OR_DEPARTMENT_OTHER): Payer: BC Managed Care – PPO | Admitting: Adult Health

## 2019-04-23 ENCOUNTER — Encounter (HOSPITAL_COMMUNITY): Payer: Self-pay

## 2019-04-23 VITALS — BP 166/99 | HR 127 | Temp 98.3°F | Resp 16 | Ht 62.0 in | Wt 172.9 lb

## 2019-04-23 DIAGNOSIS — J9611 Chronic respiratory failure with hypoxia: Secondary | ICD-10-CM | POA: Diagnosis not present

## 2019-04-23 DIAGNOSIS — C773 Secondary and unspecified malignant neoplasm of axilla and upper limb lymph nodes: Secondary | ICD-10-CM

## 2019-04-23 DIAGNOSIS — R509 Fever, unspecified: Secondary | ICD-10-CM | POA: Diagnosis not present

## 2019-04-23 DIAGNOSIS — E7849 Other hyperlipidemia: Secondary | ICD-10-CM | POA: Diagnosis not present

## 2019-04-23 DIAGNOSIS — Z171 Estrogen receptor negative status [ER-]: Secondary | ICD-10-CM

## 2019-04-23 DIAGNOSIS — C50919 Malignant neoplasm of unspecified site of unspecified female breast: Secondary | ICD-10-CM

## 2019-04-23 DIAGNOSIS — J910 Malignant pleural effusion: Secondary | ICD-10-CM

## 2019-04-23 DIAGNOSIS — R739 Hyperglycemia, unspecified: Secondary | ICD-10-CM | POA: Diagnosis present

## 2019-04-23 DIAGNOSIS — C787 Secondary malignant neoplasm of liver and intrahepatic bile duct: Secondary | ICD-10-CM | POA: Diagnosis present

## 2019-04-23 DIAGNOSIS — Z888 Allergy status to other drugs, medicaments and biological substances status: Secondary | ICD-10-CM | POA: Diagnosis not present

## 2019-04-23 DIAGNOSIS — R000 Tachycardia, unspecified: Secondary | ICD-10-CM | POA: Diagnosis present

## 2019-04-23 DIAGNOSIS — G9341 Metabolic encephalopathy: Secondary | ICD-10-CM | POA: Diagnosis not present

## 2019-04-23 DIAGNOSIS — K589 Irritable bowel syndrome without diarrhea: Secondary | ICD-10-CM | POA: Diagnosis present

## 2019-04-23 DIAGNOSIS — R0902 Hypoxemia: Secondary | ICD-10-CM | POA: Diagnosis not present

## 2019-04-23 DIAGNOSIS — J9601 Acute respiratory failure with hypoxia: Secondary | ICD-10-CM | POA: Diagnosis not present

## 2019-04-23 DIAGNOSIS — C50511 Malignant neoplasm of lower-outer quadrant of right female breast: Secondary | ICD-10-CM

## 2019-04-23 DIAGNOSIS — M25551 Pain in right hip: Secondary | ICD-10-CM | POA: Diagnosis present

## 2019-04-23 DIAGNOSIS — G8929 Other chronic pain: Secondary | ICD-10-CM | POA: Diagnosis present

## 2019-04-23 DIAGNOSIS — R188 Other ascites: Secondary | ICD-10-CM | POA: Diagnosis present

## 2019-04-23 DIAGNOSIS — G893 Neoplasm related pain (acute) (chronic): Secondary | ICD-10-CM | POA: Diagnosis not present

## 2019-04-23 DIAGNOSIS — T402X5A Adverse effect of other opioids, initial encounter: Secondary | ICD-10-CM | POA: Diagnosis not present

## 2019-04-23 DIAGNOSIS — A689 Relapsing fever, unspecified: Secondary | ICD-10-CM | POA: Diagnosis not present

## 2019-04-23 DIAGNOSIS — J90 Pleural effusion, not elsewhere classified: Secondary | ICD-10-CM

## 2019-04-23 DIAGNOSIS — R0602 Shortness of breath: Secondary | ICD-10-CM | POA: Diagnosis present

## 2019-04-23 DIAGNOSIS — Z9011 Acquired absence of right breast and nipple: Secondary | ICD-10-CM | POA: Diagnosis not present

## 2019-04-23 DIAGNOSIS — Y92230 Patient room in hospital as the place of occurrence of the external cause: Secondary | ICD-10-CM | POA: Diagnosis not present

## 2019-04-23 DIAGNOSIS — E876 Hypokalemia: Secondary | ICD-10-CM | POA: Diagnosis not present

## 2019-04-23 DIAGNOSIS — I10 Essential (primary) hypertension: Secondary | ICD-10-CM | POA: Diagnosis not present

## 2019-04-23 DIAGNOSIS — Z95828 Presence of other vascular implants and grafts: Secondary | ICD-10-CM

## 2019-04-23 DIAGNOSIS — A419 Sepsis, unspecified organism: Secondary | ICD-10-CM

## 2019-04-23 DIAGNOSIS — R531 Weakness: Secondary | ICD-10-CM

## 2019-04-23 DIAGNOSIS — R6510 Systemic inflammatory response syndrome (SIRS) of non-infectious origin without acute organ dysfunction: Secondary | ICD-10-CM | POA: Diagnosis present

## 2019-04-23 DIAGNOSIS — E785 Hyperlipidemia, unspecified: Secondary | ICD-10-CM | POA: Diagnosis present

## 2019-04-23 DIAGNOSIS — Z9010 Acquired absence of unspecified breast and nipple: Secondary | ICD-10-CM

## 2019-04-23 DIAGNOSIS — Z803 Family history of malignant neoplasm of breast: Secondary | ICD-10-CM

## 2019-04-23 DIAGNOSIS — K5903 Drug induced constipation: Secondary | ICD-10-CM | POA: Diagnosis not present

## 2019-04-23 DIAGNOSIS — R630 Anorexia: Secondary | ICD-10-CM

## 2019-04-23 DIAGNOSIS — Z79899 Other long term (current) drug therapy: Secondary | ICD-10-CM

## 2019-04-23 DIAGNOSIS — E870 Hyperosmolality and hypernatremia: Secondary | ICD-10-CM | POA: Diagnosis not present

## 2019-04-23 DIAGNOSIS — C7989 Secondary malignant neoplasm of other specified sites: Principal | ICD-10-CM | POA: Diagnosis present

## 2019-04-23 DIAGNOSIS — R5383 Other fatigue: Secondary | ICD-10-CM

## 2019-04-23 DIAGNOSIS — Z882 Allergy status to sulfonamides status: Secondary | ICD-10-CM | POA: Diagnosis not present

## 2019-04-23 DIAGNOSIS — Z20828 Contact with and (suspected) exposure to other viral communicable diseases: Secondary | ICD-10-CM | POA: Diagnosis present

## 2019-04-23 DIAGNOSIS — Z7189 Other specified counseling: Secondary | ICD-10-CM | POA: Diagnosis not present

## 2019-04-23 DIAGNOSIS — Z6832 Body mass index (BMI) 32.0-32.9, adult: Secondary | ICD-10-CM

## 2019-04-23 DIAGNOSIS — E669 Obesity, unspecified: Secondary | ICD-10-CM | POA: Diagnosis present

## 2019-04-23 DIAGNOSIS — R5081 Fever presenting with conditions classified elsewhere: Secondary | ICD-10-CM | POA: Diagnosis not present

## 2019-04-23 DIAGNOSIS — Z9221 Personal history of antineoplastic chemotherapy: Secondary | ICD-10-CM

## 2019-04-23 DIAGNOSIS — Z515 Encounter for palliative care: Secondary | ICD-10-CM | POA: Diagnosis not present

## 2019-04-23 LAB — COMPREHENSIVE METABOLIC PANEL
ALT: 39 U/L (ref 0–44)
ALT: 36 U/L (ref 0–44)
AST: 9 U/L — ABNORMAL LOW (ref 15–41)
AST: 12 U/L — ABNORMAL LOW (ref 15–41)
Albumin: 3.6 g/dL (ref 3.5–5.0)
Albumin: 3.6 g/dL (ref 3.5–5.0)
Alkaline Phosphatase: 128 U/L — ABNORMAL HIGH (ref 38–126)
Alkaline Phosphatase: 113 U/L (ref 38–126)
Anion gap: 12 (ref 5–15)
Anion gap: 11 (ref 5–15)
BUN: 9 mg/dL (ref 6–20)
BUN: 8 mg/dL (ref 6–20)
CO2: 28 mmol/L (ref 22–32)
CO2: 27 mmol/L (ref 22–32)
Calcium: 10.9 mg/dL — ABNORMAL HIGH (ref 8.9–10.3)
Calcium: 9.9 mg/dL (ref 8.9–10.3)
Chloride: 102 mmol/L (ref 98–111)
Chloride: 101 mmol/L (ref 98–111)
Creatinine, Ser: 0.93 mg/dL (ref 0.44–1.00)
Creatinine, Ser: 0.62 mg/dL (ref 0.44–1.00)
GFR calc Af Amer: >60 mL/min (ref >60)
GFR calc Af Amer: >60 mL/min (ref >60)
GFR calc non Af Amer: >60 mL/min (ref >60)
GFR calc non Af Amer: >60 mL/min (ref >60)
Glucose, Bld: 155 mg/dL — ABNORMAL HIGH (ref 70–99)
Glucose, Bld: 126 mg/dL — ABNORMAL HIGH (ref 70–99)
Potassium: 3.8 mmol/L (ref 3.5–5.1)
Potassium: 3.5 mmol/L (ref 3.5–5.1)
Sodium: 142 mmol/L (ref 135–145)
Sodium: 139 mmol/L (ref 135–145)
Total Bilirubin: 0.3 mg/dL (ref 0.3–1.2)
Total Bilirubin: 0.3 mg/dL (ref 0.3–1.2)
Total Protein: 7.6 g/dL (ref 6.5–8.1)
Total Protein: 7.2 g/dL (ref 6.5–8.1)

## 2019-04-23 LAB — CBC WITH DIFFERENTIAL/PLATELET
Abs Immature Granulocytes: 0.03 10*3/uL (ref 0.00–0.07)
Abs Immature Granulocytes: 0.04 10*3/uL (ref 0.00–0.07)
Basophils Absolute: 0 10*3/uL (ref 0.0–0.1)
Basophils Absolute: 0 10*3/uL (ref 0.0–0.1)
Basophils Relative: 0 %
Basophils Relative: 0 %
Eosinophils Absolute: 0.1 10*3/uL (ref 0.0–0.5)
Eosinophils Absolute: 0.1 10*3/uL (ref 0.0–0.5)
Eosinophils Relative: 1 %
Eosinophils Relative: 1 %
HCT: 37.8 % (ref 36.0–46.0)
HCT: 35.6 % — ABNORMAL LOW (ref 36.0–46.0)
Hemoglobin: 11.3 g/dL — ABNORMAL LOW (ref 12.0–15.0)
Hemoglobin: 10.8 g/dL — ABNORMAL LOW (ref 12.0–15.0)
Immature Granulocytes: 0 %
Immature Granulocytes: 0 %
Lymphocytes Relative: 17 %
Lymphocytes Relative: 17 %
Lymphs Abs: 1.6 10*3/uL (ref 0.7–4.0)
Lymphs Abs: 1.7 10*3/uL (ref 0.7–4.0)
MCH: 25.8 pg — ABNORMAL LOW (ref 26.0–34.0)
MCH: 26.5 pg (ref 26.0–34.0)
MCHC: 29.9 g/dL — ABNORMAL LOW (ref 30.0–36.0)
MCHC: 30.3 g/dL (ref 30.0–36.0)
MCV: 86.3 fL (ref 80.0–100.0)
MCV: 87.5 fL (ref 80.0–100.0)
Monocytes Absolute: 0.6 10*3/uL (ref 0.1–1.0)
Monocytes Absolute: 1 10*3/uL (ref 0.1–1.0)
Monocytes Relative: 6 %
Monocytes Relative: 10 %
Neutro Abs: 7.4 10*3/uL (ref 1.7–7.7)
Neutro Abs: 7.2 10*3/uL (ref 1.7–7.7)
Neutrophils Relative %: 76 %
Neutrophils Relative %: 72 %
Platelets: 383 10*3/uL (ref 150–400)
Platelets: 364 10*3/uL (ref 150–400)
RBC: 4.38 MIL/uL (ref 3.87–5.11)
RBC: 4.07 MIL/uL (ref 3.87–5.11)
RDW: 18.5 % — ABNORMAL HIGH (ref 11.5–15.5)
RDW: 18.6 % — ABNORMAL HIGH (ref 11.5–15.5)
WBC: 9.7 10*3/uL (ref 4.0–10.5)
WBC: 10.1 10*3/uL (ref 4.0–10.5)
nRBC: 0 % (ref 0.0–0.2)
nRBC: 0 % (ref 0.0–0.2)

## 2019-04-23 LAB — TSH
TSH: 2.464 u[IU]/mL (ref 0.308–3.960)
TSH: 1.334 u[IU]/mL (ref 0.350–4.500)

## 2019-04-23 LAB — SARS CORONAVIRUS 2 BY RT PCR (HOSPITAL ORDER, PERFORMED IN ~~LOC~~ HOSPITAL LAB): SARS Coronavirus 2: NEGATIVE

## 2019-04-23 MED ORDER — ACETAMINOPHEN 650 MG RE SUPP: 650.0000 mg | Freq: Four times a day (QID) | RECTAL | Status: DC | PRN

## 2019-04-23 MED ORDER — ONDANSETRON HCL 4 MG/2ML IJ SOLN
4.0000 mg | Freq: Once | INTRAMUSCULAR | Status: AC
  Administered 2019-04-23: 13:00:00 4 mg via INTRAVENOUS
  Filled 2019-04-23: qty 2

## 2019-04-23 MED ORDER — ATORVASTATIN CALCIUM 10 MG PO TABS
20.0000 mg | ORAL_TABLET | Freq: Every day | ORAL | Status: DC
  Administered 2019-04-23 – 2019-05-05 (×11): 20 mg via ORAL
  Filled 2019-04-23 (×9): qty 2
  Filled 2019-04-23: qty 1
  Filled 2019-04-23: qty 2

## 2019-04-23 MED ORDER — IOHEXOL 350 MG/ML SOLN
100.0000 mL | Freq: Once | INTRAVENOUS | Status: AC | PRN
  Administered 2019-04-23: 100 mL via INTRAVENOUS

## 2019-04-23 MED ORDER — POLYETHYLENE GLYCOL 3350 17 G PO PACK: 17.0000 g | PACK | Freq: Every day | ORAL | Status: DC | PRN

## 2019-04-23 MED ORDER — HYDROMORPHONE HCL 1 MG/ML IJ SOLN
1.0000 mg | Freq: Once | INTRAMUSCULAR | Status: AC
  Administered 2019-04-23: 1 mg via INTRAVENOUS
  Filled 2019-04-23: qty 1

## 2019-04-23 MED ORDER — LABETALOL HCL 5 MG/ML IV SOLN
5.0000 mg | INTRAVENOUS | Status: DC | PRN
  Administered 2019-04-24 – 2019-05-07 (×17): 5 mg via INTRAVENOUS
  Filled 2019-04-23 (×22): qty 4

## 2019-04-23 MED ORDER — METOPROLOL TARTRATE 5 MG/5ML IV SOLN
5.0000 mg | Freq: Once | INTRAVENOUS | Status: AC
  Administered 2019-04-23: 17:00:00 5 mg via INTRAVENOUS
  Filled 2019-04-23: qty 5

## 2019-04-23 MED ORDER — SODIUM CHLORIDE (PF) 0.9 % IJ SOLN
INTRAMUSCULAR | Status: AC
  Filled 2019-04-23: qty 50

## 2019-04-23 MED ORDER — ENOXAPARIN SODIUM 40 MG/0.4ML ~~LOC~~ SOLN
40.0000 mg | Freq: Every day | SUBCUTANEOUS | Status: DC
  Administered 2019-04-23: 40 mg via SUBCUTANEOUS
  Filled 2019-04-23: qty 0.4

## 2019-04-23 MED ORDER — ACETAMINOPHEN 325 MG PO TABS
650.0000 mg | ORAL_TABLET | Freq: Four times a day (QID) | ORAL | Status: DC | PRN
  Administered 2019-04-24 – 2019-05-04 (×9): 650 mg via ORAL
  Administered 2019-05-04: 325 mg via ORAL
  Administered 2019-05-05 – 2019-05-11 (×8): 650 mg via ORAL
  Filled 2019-04-23 (×20): qty 2

## 2019-04-23 MED ORDER — MORPHINE SULFATE (PF) 4 MG/ML IV SOLN
4.0000 mg | Freq: Once | INTRAVENOUS | Status: AC
  Administered 2019-04-23: 13:00:00 4 mg via INTRAVENOUS
  Filled 2019-04-23: qty 1

## 2019-04-23 MED ORDER — METOPROLOL TARTRATE 25 MG PO TABS
12.5000 mg | ORAL_TABLET | Freq: Two times a day (BID) | ORAL | Status: DC
  Administered 2019-04-23: 12.5 mg via ORAL
  Filled 2019-04-23: qty 1

## 2019-04-23 MED ORDER — OXYCODONE HCL 5 MG PO TABS
5.0000 mg | ORAL_TABLET | ORAL | Status: DC | PRN
  Administered 2019-04-24: 5 mg via ORAL
  Filled 2019-04-23: qty 1

## 2019-04-23 NOTE — Telephone Encounter (Signed)
She will have to wait until her PCP gets back from vacation

## 2019-04-23 NOTE — ED Triage Notes (Signed)
Pt being sent by [facility].  C/o increasing SOB, tachycardia, and lethargy.  Hx of metastatic breast CA.  Receiving immunotherapy.  Recently, admitted for pleural effusion.

## 2019-04-23 NOTE — Telephone Encounter (Unsigned)
Copied from [place] [id number]. Topic: General - Other >> Apr 22, 2019 10:31 AM Antonieta Iba C wrote: Reason for CRM: pt called in to request a call back. Pt says that she is following up on her FMLA paperwork.   Please call pt back: [phone]

## 2019-04-23 NOTE — ED Provider Notes (Signed)
Medical screening examination/treatment/procedure(s) were conducted as a shared visit with non-physician practitioner(s) and myself.  I personally evaluated the patient during the encounter. Briefly, the patient is a 48 y.o. female with history of breast cancer, high cholesterol who presents the ED with tachycardia, tachypnea, shortness of breath.  Had recent thoracentesis as she has had pleural effusions on the right side of her lungs.  Patient overall denies pain but feels shortness of breath especially with exertion.  Patient with sinus tachycardia on EKG.  Chest x-ray shows stable and persistent right lower lobe process likely a combination of effusion and atelectasis.  We will get a CT scan to further evaluate.  We will get blood work.  Does not have any fever or cough.  But will swab for coronavirus.  Believe she may benefit from another thoracentesis to help with symptomatic care.  She did not have cytology done during her last admission.  Lab work shows no significant leukocytosis, anemia, electrolyte abnormality.  Heart rate has been steady in the 135-140 range.  Closer look at chart review patient was prescribed metoprolol for possibly atrial flutter by her primary care doctor.  Although review of old EKGs does not necessarily show atrial flutter.  However possibly she does have an atrial tachycardia as her heart rate has been so consistent at the 140 range.  Will consult cardiology.  May start diltiazem IV and bolus upon the recommendation.  However could also be secondary to lung process.  This chart was dictated using voice recognition software.  Despite best efforts to proofread,  errors can occur which can change the documentation meaning.     EKG Interpretation  Date/Time:  Wednesday April 23 2019 10:18:23 EDT Ventricular Rate:  143 PR Interval:    QRS Duration: 65 QT Interval:  289 QTC Calculation: 446 R Axis:   22 Text Interpretation:  Sinus tachycardia Nonspecific T abnrm,  anterolateral leads Confirmed by Lennice Sites [phone]) on 04/23/2019 12:38:24 PM          Lennice Sites, DO 04/23/19 1450

## 2019-04-23 NOTE — ED Triage Notes (Signed)
She came to [name] from our [facility] for a "routine check and labs and I was going to talk to my doctor; [name]". The L.P.N. accompanying her tells me that pt. Was found by them to have tachycardia (heart rate ~125), so she was brought to E.D. Pt. Arrives here in no distress. And is alert and oriented x 4 with clear speech. She tells me she feels well, and that she has had no sign(s) of current illness.

## 2019-04-23 NOTE — Progress Notes (Addendum)
[place]  [phone] [phone]    ID: Brittany Rodgers DOB: 05/29/1971  MR#: [id number]  ZSW#:[id number]  Patient Care Team: Brittany Morale, MD as PCP - Brittany Draft, MD as Consulting Physician (General Surgery) Brittany Rodgers, Brittany Dad, MD as Consulting Physician (Oncology) Brittany Gibson, MD as Attending Physician (Radiation Oncology) Brittany Mayer, MD as Consulting Physician (Gastroenterology) Brittany Millers, MD as Consulting Physician (Obstetrics and Gynecology) Brittany Germany, RN as Oncology Nurse Navigator Brittany Kaufmann, RN as Oncology Nurse Navigator Brittany Limbo, MD as Consulting Physician (Plastic Surgery) OTHER MD: Dr. Siri Rodgers (urology)    CHIEF COMPLAINT: Triple negative breast cancer  CURRENT TREATMENT: pembrolizumab, goserelin   INTERVAL HISTORY: Brittany Rodgers returns today for follow-up and treatment of her triple negative breast cancer.    Brittany Rodgers was recently hospitalized for right pleural effusion and tachycardia.  She underwent thoracentesis and was discharged last week.  She notes over the past week she has gotten weaker and has been struggling at home.  Her husband is helping her when he can, but he is at work from 6 am to 6pm.    REVIEW OF SYSTEMS: Brittany Rodgers continues to have weakness and fatigue.   Her pain is in her right chest wall and right arm.  This is not helping with the pain. She is in pain and is taking gabapentin TID.  She notes that she is tired a lot.  She has decreased appetite and notes she has been eating minimally.  She drinks water and notes that she always has a cup by her side.  She is not able to cook her own food while her husband is gone, or make herself something to eat.  She denies nausea or vomiting.  She thinks her last bowel movement was about 4-5 days ago.    Brittany Rodgers is increasingly Brittany Rodgers of breath.  She notes that it has gotten worse over the past two days.  HISTORY OF CURRENT  ILLNESS: From the original intake note:  Brittany Rodgers presented with a palpable right breast lump and associated tenderness for approximately 1 week. She called her doctor immediately following  She underwent unilateral right diagnostic mammography with tomography and right breast ultrasonography at [facility] on 10/28/2018 showing: Breast Density Category C. Radiopaque BB was placed at the site of the patient's palpable lump in the central right breast. An irregular hyperdense mass with associated increased trabecular thickening is seen in the deep central right breast. On physical exam, a firm, fixed mass in the central right breast is palpated. Target ultrasound is performed, showing and irregular hypoechoic mass at the 8 o'clock position 3 cm from the nipple. It measures 3.6 x 2.9 x 2.3 cm. There is associated peripheral vascularity. Evaluation of the right axilla demonstrates a single prominent lymph node demonstrating 0.4 cm cortical thickening. The remaining lymph nodes ave normal appearance with a thin, symmetric cortex.   Accordingly on 10/29/2018 she proceeded to biopsy of the right breast area in question. The pathology from this procedure showed ([id number]): invasive ductal carcinoma, grade III. Prognostic indicators significant for: estrogen receptor, 0% negative and progesterone receptor, 0% negative. Proliferation marker Ki67 at 80%. HER2 negative (1+) by immunohistochemistry.  Biopsy of the right axilla was performed on the same day. The pathology from this procedure showed ([id number]): lymph node, negative for carcinoma.  This was felt to be concordant  The patient's subsequent history is as detailed below.   PAST MEDICAL HISTORY: Past Medical History:  Diagnosis Date   Cancer [facility])    BREAST CANCER RIGHT BREAST   Chronic cough    Family history of breast cancer    GERD (gastroesophageal reflux disease)    Hyperlipidemia    IBS (irritable bowel syndrome)    sees  Brittany Rodgers    Routine gynecological examination    sees Dr. Freda Rodgers     PAST SURGICAL HISTORY: Past Surgical History:  Procedure Laterality Date   CESAREAN SECTION     MASTECTOMY W/ SENTINEL NODE BIOPSY Right 03/04/2019   Procedure: RIGHT MASTECTOMY WITH RIGHT AXILLARY SENTINEL LYMPH NODE BIOPSY;  Surgeon: Brittany Bookbinder, MD;  Location: [place];  Service: General;  Laterality: Right;   PORTACATH PLACEMENT Right 11/14/2018   Procedure: INSERTION PORT-A-CATH WITH ULTRASOUND;  Surgeon: Brittany Bookbinder, MD;  Location: [place];  Service: General;  Laterality: Right;   ROOT CANAL       FAMILY HISTORY: Family History  Problem Relation Age of Onset   Hypertension Other    Breast cancer Other        mat first cousin's daughter   Breast cancer Maternal Aunt    Hypertension Mother    Breast cancer Maternal Aunt    Breast cancer Maternal Aunt    Brittany Rodgers's father died from an unknown cause at age 91. [name] died from unknown health complications at age 74. The patient has 4 sisters. Brittany Rodgers has 3 maternal aunts that have been diagnosed with breast cancer. Patient denies anyone in her family having ovarian, prostate, or pancreatic cancer.    GYNECOLOGIC HISTORY:  No LMP recorded. (Menstrual status: Chemotherapy). 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 Her husband, Brittany Rodgers, is a Administrator. The patient has two children, Brittany Rodgers and Brittany Rodgers, from another relationship. Brittany Rodgers is 73, lives in [place]. Brittany Rodgers is 16, lives in [place], and is in Mud[employer].  The patient's husband Brittany Rodgers has a child, Brittany Rodgers, from another relationship; she lives in [place] and is in school. The patient has 5 grandchildren. She attends the Brittany Rodgers.    ADVANCED DIRECTIVES: Brittany's husband, Brittany Rodgers, is automatically her healthcare power of attorney.     HEALTH MAINTENANCE: Social History   Tobacco Use   Smoking status: Never Smoker   Smokeless tobacco: Never Used  Substance Use Topics   Alcohol use: No    Alcohol/week: 0.0 standard drinks   Drug use: No    Colonoscopy: yes  PAP: 02/2018  Bone density: no   Allergies  Allergen Reactions   Barbiturates Other (See Comments)    Keeps pt awake   Methocarbamol Itching   Sulfonamide Derivatives Itching    Current Outpatient Medications  Medication Sig Dispense Refill   atorvastatin (LIPITOR) 20 MG tablet Take 1 tablet (20 mg total) by mouth daily. 90 tablet 3   cyclobenzaprine (FLEXERIL) 10 MG tablet Take 1 tablet (10 mg total) by mouth 3 (three) times daily. 30 tablet 2   gabapentin (NEURONTIN) 300 MG capsule Take 1 capsule (300 mg total) by mouth 3 (three) times daily. 90 capsule 1   lidocaine-prilocaine (EMLA) cream Apply to affected area once (Patient taking differently: Apply 1 application topically daily as needed (numbing). ) 30 g 3   loratadine (CLARITIN) 10 MG tablet Take 1 tablet (10 mg total) by mouth  daily. 60 tablet 0   nystatin (MYCOSTATIN) 100000 UNIT/ML suspension Take 5 mLs (500,000 Units total) by mouth 4 (four) times daily. 60 mL 0   Omeprazole-Sodium Bicarbonate (ZEGERID) 20-1100 MG CAPS capsule Take 1 capsule by mouth daily before breakfast. 90 each 3   oxyCODONE (OXY IR/ROXICODONE) 5 MG immediate release tablet Take 1 tablet (5 mg total) by mouth every 4 (four) hours as needed for moderate pain. 15 tablet 0   polyethylene glycol (MIRALAX / GLYCOLAX) packet Take 17 g by mouth as needed. (Patient taking differently: Take 17 g by mouth daily as needed (constipation). Mix in 8 oz liquid and drink) 100 each 5   potassium chloride (K-DUR) 10 MEQ tablet TAKE 1 TABLET BY MOUTH ONCE DAILY (Patient taking differently: Take 10 mEq by mouth daily. ) 90  tablet 3   metoprolol succinate (TOPROL-XL) 50 MG 24 hr tablet Take 1 tablet (50 mg total) by mouth daily. Take with or immediately following a meal. (Patient not taking: Reported on 04/23/2019) 30 tablet 1   No current facility-administered medications for this visit.      OBJECTIVE: Vitals:   04/23/19 0857  BP: (!) 166/99  Pulse: (!) 127  Resp: 16  Temp: 98.3 F (36.8 C)  SpO2: 98%     Body mass index is 31.62 kg/m.   Wt Readings from Last 3 Encounters:  04/23/19 172 lb 14.4 oz (78.4 kg)  04/14/19 181 lb 6.4 oz (82.3 kg)  04/09/19 181 lb 3.2 oz (82.2 kg)   ECOG FS:2 - Symptomatic, <50% confined to bed GENERAL: Patient appears Giorgio of breath, worse with talking, she appears somnolent, closing her eyes mid sentence frequently while talking HEENT:  Sclerae anicteric.  Marland Kitchen Neck is supple.  LUNGS:  Right lung diminished, left lung clear HEART:  Tachycardic, regular rhythm ABDOMEN:  Soft, hypoactive bowel sounds EXTREMITIES:  Scant bilateral lower extremity edema SKIN:  Clear with no obvious rashes or skin changes. + nail dyscrasia. NEURO: Intermittently closing eyes mid sentence   LAB RESULTS:  CMP     Component Value Date/Time   NA 142 04/23/2019 0834   K 3.8 04/23/2019 0834   CL 102 04/23/2019 0834   CO2 28 04/23/2019 0834   GLUCOSE 155 (H) 04/23/2019 0834   BUN 9 04/23/2019 0834   CREATININE 0.93 04/23/2019 0834   CREATININE 0.73 02/11/2019 0859   CALCIUM 10.9 (H) 04/23/2019 0834   PROT 7.6 04/23/2019 0834   ALBUMIN 3.6 04/23/2019 0834   AST 9 (L) 04/23/2019 0834   AST 6 (L) 02/11/2019 0859   ALT 39 04/23/2019 0834   ALT 20 02/11/2019 0859   ALKPHOS 128 (H) 04/23/2019 0834   BILITOT 0.3 04/23/2019 0834   BILITOT 0.3 02/11/2019 0859   GFRNONAA >60 04/23/2019 0834   GFRNONAA >60 02/11/2019 0859   GFRAA >60 04/23/2019 0834   GFRAA >60 02/11/2019 0859    No results found for: TOTALPROTELP, ALBUMINELP, A1GS, A2GS, BETS, BETA2SER, GAMS, MSPIKE, SPEI  No  results found for: KPAFRELGTCHN, LAMBDASER, [facility]  Lab Results  Component Value Date   WBC 9.7 04/23/2019   NEUTROABS 7.4 04/23/2019   HGB 11.3 (L) 04/23/2019   HCT 37.8 04/23/2019   MCV 86.3 04/23/2019   PLT 383 04/23/2019    '@LASTCHEMISTRY'$ @  No results found for: LABCA2  No components found for: [id number]  No results for input(s): INR in the last 168 hours.  No results found for: LABCA2  No results found for: MKL491  No results found  for: ZRA076  No results found for: AUQ333  Lab Results  Component Value Date   CA2729 17.6 04/02/2019    No components found for: HGQUANT  No results found for: CEA1 / No results found for: CEA1   No results found for: AFPTUMOR  No results found for: [place]  No results found for: PSA1  Appointment on 04/23/2019  Component Date Value Ref Range Status   WBC 04/23/2019 9.7  4.0 - 10.5 K/uL Final   RBC 04/23/2019 4.38  3.87 - 5.11 MIL/uL Final   Hemoglobin 04/23/2019 11.3* 12.0 - 15.0 g/dL Final   HCT 04/23/2019 37.8  36.0 - 46.0 % Final   MCV 04/23/2019 86.3  80.0 - 100.0 fL Final   MCH 04/23/2019 25.8* 26.0 - 34.0 pg Final   MCHC 04/23/2019 29.9* 30.0 - 36.0 g/dL Final   RDW 04/23/2019 18.5* 11.5 - 15.5 % Final   Platelets 04/23/2019 383  150 - 400 K/uL Final   nRBC 04/23/2019 0.0  0.0 - 0.2 % Final   Neutrophils Relative % 04/23/2019 76  % Final   Neutro Abs 04/23/2019 7.4  1.7 - 7.7 K/uL Final   Lymphocytes Relative 04/23/2019 17  % Final   Lymphs Abs 04/23/2019 1.6  0.7 - 4.0 K/uL Final   Monocytes Relative 04/23/2019 6  % Final   Monocytes Absolute 04/23/2019 0.6  0.1 - 1.0 K/uL Final   Eosinophils Relative 04/23/2019 1  % Final   Eosinophils Absolute 04/23/2019 0.1  0.0 - 0.5 K/uL Final   Basophils Relative 04/23/2019 0  % Final   Basophils Absolute 04/23/2019 0.0  0.0 - 0.1 K/uL Final   Immature Granulocytes 04/23/2019 0  % Final   Abs Immature Granulocytes 04/23/2019 0.03  0.00 -  0.07 K/uL Final   Performed at [facility], [place] [address] [place], [place] [address]   Sodium 04/23/2019 142  135 - 145 mmol/L Final   Potassium 04/23/2019 3.8  3.5 - 5.1 mmol/L Final   Chloride 04/23/2019 102  98 - 111 mmol/L Final   CO2 04/23/2019 28  22 - 32 mmol/L Final   Glucose, Bld 04/23/2019 155* 70 - 99 mg/dL Final   BUN 04/23/2019 9  6 - 20 mg/dL Final   Creatinine, Ser 04/23/2019 0.93  0.44 - 1.00 mg/dL Final   Calcium 04/23/2019 10.9* 8.9 - 10.3 mg/dL Final   Total Protein 04/23/2019 7.6  6.5 - 8.1 g/dL Final   Albumin 04/23/2019 3.6  3.5 - 5.0 g/dL Final   AST 04/23/2019 9* 15 - 41 U/L Final   ALT 04/23/2019 39  0 - 44 U/L Final   Alkaline Phosphatase 04/23/2019 128* 38 - 126 U/L Final   Total Bilirubin 04/23/2019 0.3  0.3 - 1.2 mg/dL Final   GFR calc non Af Amer 04/23/2019 >60  >60 mL/min Final   GFR calc Af Amer 04/23/2019 >60  >60 mL/min Final   Anion gap 04/23/2019 12  5 - 15 Final   Performed at [facility], [place] [address], [place], [place] [address]    (this displays the last labs from the last 3 days)  No results found for: TOTALPROTELP, ALBUMINELP, A1GS, A2GS, BETS, BETA2SER, GAMS, MSPIKE, SPEI (this displays SPEP labs)  No results found for: KPAFRELGTCHN, LAMBDASER, KAPLAMBRATIO (kappa/lambda light chains)  No results found for: HGBA, HGBA2QUANT, HGBFQUANT, HGBSQUAN (Hemoglobinopathy evaluation)   No results found for: LDH  No results found for: IRON, TIBC, IRONPCTSAT (Iron and TIBC)  No results found for: FERRITIN  Urinalysis    Component Value Date/Time   COLORURINE STRAW (A) 12/06/2018 1014   APPEARANCEUR CLEAR 12/06/2018 1014   LABSPEC 1.009 12/06/2018 1014   PHURINE 7.0 12/06/2018 1014   GLUCOSEU NEGATIVE 12/06/2018 1014   HGBUR SMALL (A) 12/06/2018 1014   BILIRUBINUR NEGATIVE 12/06/2018 1014   BILIRUBINUR negative 05/31/2018 1011   KETONESUR NEGATIVE 12/06/2018 1014    PROTEINUR NEGATIVE 12/06/2018 1014   UROBILINOGEN 0.2 05/31/2018 1011   UROBILINOGEN 0.2 05/18/2010 1042   NITRITE NEGATIVE 12/06/2018 1014   LEUKOCYTESUR NEGATIVE 12/06/2018 1014     STUDIES:  Dg Chest 2 View  Result Date: 04/16/2019 CLINICAL DATA:  Right flank and chest pain as well as right shoulder pain. Shortness of breath and weakness. Recent diagnosis of right breast cancer. EXAM: CHEST - 2 VIEW COMPARISON:  04/13/2019 and 11/14/2018 FINDINGS: Right IJ Port-A-Cath unchanged. Persistent moderate size right pleural effusion likely with associated right basilar atelectasis. Left lung is clear. Mild stable cardiomegaly. Remainder of the exam is unchanged. IMPRESSION: Stable moderate size right pleural effusion likely with associated right basilar compressive atelectasis. Mild stable cardiomegaly. Electronically Signed   By: Marin Olp M.D.   On: 04/16/2019 16:39   Dg Chest 2 View  Result Date: 04/13/2019 CLINICAL DATA:  49 year old with RIGHT mastectomy on 03/04/2019 for breast cancer, currently being admitted due to a RIGHT pleural effusion. EXAM: CHEST - 2 VIEW 5:30 p.m.: COMPARISON:  Portable chest x-ray earlier same day at 12:43 a.m. and previous chest x-rays. CT chest 04/11/2019 and earlier. FINDINGS: AP SEMI-ERECT and LATERAL images were obtained. Cardiac silhouette mildly enlarged even allowing for AP technique, unchanged. Large RIGHT pleural effusion with a subpulmonic component, unchanged since the examination earlier today, increased in size since yesterday. Associated passive atelectasis in the RIGHT LOWER LOBE. Mild linear atelectasis in the LEFT LOWER LOBE as noted on the recent CT. No new pulmonary parenchymal abnormalities. RIGHT jugular Port-A-Cath tip at or near the cavoatrial junction. Post surgical changes related to RIGHT mastectomy and axillary node removal. IMPRESSION: 1. Large RIGHT pleural effusion with a subpulmonic component, increased in size since yesterday.  Associated passive atelectasis in the RIGHT LOWER LOBE. 2. Mild linear atelectasis in the LEFT LOWER LOBE as noted on the recent CT. 3. No new abnormalities. Electronically Signed   By: Evangeline Dakin M.D.   On: 04/13/2019 18:11   Dg Chest 2 View  Result Date: 04/10/2019 CLINICAL DATA:  Shortness of breath. Right breast carcinoma. Gastroesophageal reflux disease. EXAM: CHEST - 2 VIEW COMPARISON:  12/06/2018 FINDINGS: Heart size is normal. Right-sided power port remains in place. New infiltrate or atelectasis is seen in both lung bases, right side greater than left. No evidence of pleural effusion. IMPRESSION: New bibasilar atelectasis versus infiltrates, right side greater than left. Electronically Signed   By: Marlaine Hind M.D.   On: 04/10/2019 08:11   Ct Angio Chest Pe W And/or Wo Contrast  Result Date: 04/11/2019 CLINICAL DATA:  48 year old female with history of right breast cancer and recent mastectomy presenting with shortness of breath. EXAM: CT ANGIOGRAPHY CHEST WITH CONTRAST TECHNIQUE: Multidetector CT imaging of the chest was performed using the standard protocol during bolus administration of intravenous contrast. Multiplanar CT image reconstructions and MIPs were obtained to evaluate the vascular anatomy. CONTRAST:  142m OMNIPAQUE IOHEXOL 350 MG/ML SOLN COMPARISON:  Chest CT dated 03/26/2019 and radiograph dated 04/09/2019 and CT dated 11/15/2018 FINDINGS: Cardiovascular: Borderline cardiomegaly. No pericardial effusion. The thoracic aorta is  unremarkable. The origins of the great vessels of the aortic arch are patent as visualized. Evaluation of the pulmonary arteries is limited due to respiratory motion artifact and suboptimal visualization of the peripheral branches. No large or central pulmonary artery embolus identified. Mediastinum/Nodes: There is a 6.7 x 5.6 cm soft tissue appearing mass in the right cardiophrenic angle with infiltration of the anterior chest wall and extension into the  subpectoral region. This is new compared to the CT of 11/15/2018 and has increased in size since the study of 03/26/2019 where it measured approximately 4.0 x 3.7 cm by my measurements. This may represent a loculated malignant effusion versus a soft tissue metastasis. Although, the lobulated border and infiltration into the anterior chest wall suggest metastasis, significant interval increase in size over the course of 2 weeks is somewhat unusual. This can be better evaluated with ultrasound or MRI. No definite mediastinal or hilar adenopathy identified. The esophagus is grossly unremarkable. Right pectoral Port-A-Cath with tip in the region of the right atrium noted. Lungs/Pleura: There is a moderate right pleural effusion with associated partial compressive atelectasis of the right lower lobe as well as areas of atelectatic changes in the right upper lobe. There has been interval increase in the size of the pleural effusion and associated atelectasis since the prior CT. Linear left lung base atelectasis/scarring as well as a small left pleural effusion noted. There is no pneumothorax. The central airways are patent. Upper Abdomen: Ill-defined partially exophytic 3.2 x 2.7 cm lesion from the posterior right lobe of the liver as seen previously. There is a 4 mm right renal upper pole calculus. Musculoskeletal: Surgical clips in the right axilla and right mastectomy. Interval resolution of the right axillary fluid/seroma. Biopsy clip in the left breast. No acute osseous pathology. Review of the MIP images confirms the above findings. IMPRESSION: 1. No CT evidence of central pulmonary artery embolus. 2. Interval increase in the size of the right pleural effusion with associated partial compressive atelectasis of the right lower lobe. 3. Interval increase in the size of the right cardiophrenic angle soft tissue mass or loculated collection with infiltration of the anterior chest wall. This may represent a loculated  malignant effusion versus a soft tissue metastasis. This can be better evaluated with ultrasound or MRI. 4. Ill-defined partially exophytic lesion from the posterior right lobe of the liver as seen on the prior CT. 5. Interval resolution of the right axillary fluid/seroma. Electronically Signed   By: Anner Crete M.D.   On: 04/11/2019 22:08   Ct Angio Chest Pe W Or Wo Contrast  Result Date: 03/26/2019 CLINICAL DATA:  Recent mastectomy for breast carcinoma. Shortness of breath. EXAM: CT ANGIOGRAPHY CHEST WITH CONTRAST TECHNIQUE: Multidetector CT imaging of the chest was performed using the standard protocol during bolus administration of intravenous contrast. Multiplanar CT image reconstructions and MIPs were obtained to evaluate the vascular anatomy. CONTRAST:  17m ISOVUE-370 IOPAMIDOL (ISOVUE-370) INJECTION 76% COMPARISON:  Chest CT November 15, 2018 FINDINGS: Cardiovascular: There is no demonstrable pulmonary embolus. There is no thoracic aortic aneurysm or dissection. The visualized great vessels appear unremarkable. Port-A-Cath tip is in the superior vena cava. There is no pericardial effusion or pericardial thickening evident. Mediastinum/Nodes: Thyroid appears unremarkable. There is no appreciable thoracic adenopathy. No esophageal lesions are evident. Lungs/Pleura: There is a fairly small pleural effusion on the right with a rather minimal pleural effusion on the left. There is consolidation with atelectasis in the posterior segments of each lower lobe. There is  also mild consolidation in the inferior lingula. No pneumothorax. No pulmonary nodular lesions evident. Upper Abdomen: There is a mass arising eccentrically from the liver measuring 4.1 x 2.4 cm, an apparent metastatic lesion. Visualized upper abdominal structures otherwise appear unremarkable. Musculoskeletal: No blastic or lytic bone lesions are evident. There is postoperative change in the right breast at the site of recent mastectomy.  There is a fluid collection within the right chest region anterior to the muscles in the area of mastectomy measuring 2.4 x 2.2 cm. This structure represents either a seroma or liquefying hematoma. Review of the MIP images confirms the above findings. IMPRESSION: 1. No demonstrable pulmonary embolus. No thoracic aortic aneurysm or dissection. 2. Small pleural effusion on the right with rather minimal pleural effusion on the left. Consolidation noted in both posterior lung bases as well as in the inferior lingula. Suspect atelectasis with likely superimposed pneumonia in these areas. 3.  No thoracic adenopathy. 4. Mass arising eccentrically from the right lobe of the liver posteriorly, felt to represent a metastatic focus, also present on most recent CT. 5. Seroma versus liquefying hematoma and postoperative region right breast measuring 2.4 x 2.2 cm. Electronically Signed   By: Lowella Grip III M.D.   On: 03/26/2019 13:57   Dg Chest Port 1 View  Result Date: 04/13/2019 CLINICAL DATA:  48 year old female with decreased breath sounds. EXAM: PORTABLE CHEST 1 VIEW COMPARISON:  Chest radiograph dated 04/12/2019 FINDINGS: Right-sided Port-A-Cath in similar position. There is shallow inspiration with bibasilar atelectasis. Infiltrate is not excluded. Overall no significant interval change since the earlier radiograph. Probable small bilateral pleural effusions there is no pneumothorax. Stable cardiac silhouette. No acute osseous pathology. Right axillary surgical clips. IMPRESSION: Shallow inspiration with small bilateral pleural effusions and bibasilar atelectasis. Overall similar or slightly worsened since the prior radiograph. Electronically Signed   By: Anner Crete M.D.   On: 04/13/2019 01:13   Dg Chest Port 1 View  Result Date: 04/12/2019 CLINICAL DATA:  Post thoracentesis EXAM: PORTABLE CHEST 1 VIEW COMPARISON:  04/09/2019 FINDINGS: Slight interval reduction in volume of a right-sided pleural  effusion. No significant pneumothorax. Otherwise unchanged examination with cardiomegaly and bandlike scarring or atelectasis of the left lung base. IMPRESSION: Slight interval reduction in volume of a right-sided pleural effusion. No significant pneumothorax. Otherwise unchanged examination with cardiomegaly and bandlike scarring or atelectasis of the left lung base. Electronically Signed   By: Eddie Candle M.D.   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 She presents for thoracentesis. EXAM: ULTRASOUND GUIDED RIGHT THORACENTESIS MEDICATIONS: None. COMPLICATIONS: None immediate. PROCEDURE: An ultrasound guided thoracentesis was thoroughly discussed with the patient and questions answered. The benefits, risks, alternatives and complications were also discussed. The patient understands and wishes to proceed with the procedure. Written consent was obtained. Ultrasound was performed to localize and mark an adequate pocket of fluid in the right chest. The area was then prepped and draped in the normal sterile fashion. 1% Lidocaine was used for local anesthesia. Under ultrasound guidance a 6 Fr Safe-T-Centesis catheter was introduced. Thoracentesis was performed. The catheter was removed and a dressing applied. FINDINGS: A total of approximately 500 mL of bloody pleural fluid was removed. Samples were sent to the laboratory as requested by the clinical team. IMPRESSION: Successful ultrasound guided right thoracentesis yielding 500 mL of pleural fluid. Electronically Signed  By: Jacqulynn Cadet M.D.   On: 04/12/2019 13:09     ELIGIBLE FOR AVAILABLE RESEARCH PROTOCOL: Declined   ASSESSMENT: 48 y.o. Brittany Rodgers, Brittany Rodgers status post right breast lower outer quadrant biopsy 10/29/2018 for a clinical T2 N0, stage IIB invasive ductal  carcinoma, grade 3, triple negative, with an MIB-1 of 80%  (a) CT of the chest 11/15/2018 shows possible liver metastases, but liver biopsy 11/25/2018 consistent with hemangioma  (b) bone scan 11/15/2018 shows no suspicious bone lesions  (1) neoadjuvant chemotherapy consisting of doxorubicin and cyclophosphamide in dose dense fashion x4, started 11/26/2018, completed 01/07/2019, followed by weekly paclitaxel and carboplatin x12 starting 01/28/2019  (2) patient desires ovarian function preservation:   (a) Zoladex started 11/08/2018, discontinued 02/04/2019  (b) patient is status post bilateral tubal ligation  (3) status post right mastectomy with sentinel lymph node sampling 03/04/2019 for a ypTac ypN1a invasive ductal carcinoma grade 3, repeat prognostic panel again triple negative  (a) a total of 6 axillary lymph nodes removed, 2 sentinel ones, 1 positive  (4) adjuvant radiation pending  (5) genetics counseling on 11/28/2018, testing was recommended, but declined by patient.  (6) Staging studies:  (a) chest CT scan and bone scan 11/15/2018 show a liver mass, no lung or bone lesions  (b) liver biopsy 11/25/2018 shows only a hemangioma  (c) chest CT Angie 03/26/2019 again shows liver mass, right breast seroma  (6) pembrolizumab started 04/02/2019, repeated every 21 days   PLAN: Brittany Rodgers appears acutely ill.  I am concerned about her tachycardia, of 145, her shortness of breath and tachypnea, and her increased somnolence. I reviewed this with [name] who also came in and met with Brittany.  She was recommended to go to the Emergency Room for more acute evaluation and intervention.  She will need a biopsy of her liver and of her mass in her chest wall at some point.  This can be done as inpatient or outpatient.    I called Alana in the ED and gave report.     Jamelle Haring, NP  04/23/19 9:32 AM Medical Oncology and Hematology [facility] [place], [place] [address] Tel. [phone]    Fax. [phone]   ADDENDUM: Brittany's pain is poorly controlled, though she has significant lethargy from the pain medicaitons she is receiving. She has labored breathing and may have developed a PE or significant worsening of her effusion. She is very tachycardic. -- We cannot proceed to treatment and she will need emergent evaluation in the ED  I personally saw this patient and performed a substantive portion of this encounter with the listed APP documented above.   Chauncey Cruel, MD Medical Oncology and Hematology [facility] [address] [place], [place] [address] Tel. [phone]    Fax. [phone]

## 2019-04-23 NOTE — H&P (Signed)
History and Physical    Brittany Rodgers TML:[id number] DOB: 02/07/1971 DOA: 04/23/2019  PCP: [name], MD  Patient coming from: Home  Chief Complaint: SOB, lethargy  HPI: Brittany Rodgers is a 48 y.o. female with medical history significant of metastatic breast cancer s/p mastectomy presents to ED with complaints of increased sob and lethargy. Of note, pt is tired appearing and consequently a poor historian at this time. Per chart review, pt was seen recently in Oncology clinic on 7/28, where pt was noted to have new chest wall mass as well as liver mass. Pt was being considered for possible bipopsy. On day of admit, pt noted to be increasingly sob with tachycardia and was subseqently referred to ED from Oncology clinic.  ED Course: In the ED, pt was noted to have HR in excess of 140's, ultimately improved to the 110's with one dose of IV metoprolol. Pt underwent CT chest with findings worrisome for large R sided effusion. Pt remained on minimal O2 support. IR was consulted by EDP and hospitalist consulted for consideration for admission.  COVID testing was neg  Review of Systems:  Review of Systems  Constitutional: Positive for malaise/fatigue. Negative for chills and fever.  HENT: Negative for congestion, ear pain, nosebleeds and tinnitus.   Eyes: Negative for double vision, photophobia, pain and discharge.  Respiratory: Positive for shortness of breath. Negative for hemoptysis and sputum production.   Cardiovascular: Negative for orthopnea and claudication.  Gastrointestinal: Negative for abdominal pain, nausea and vomiting.  Genitourinary: Negative for frequency, hematuria and urgency.  Musculoskeletal: Negative for back pain, falls and neck pain.  Neurological: Negative for tingling, tremors, seizures and loss of consciousness.  Psychiatric/Behavioral: Negative for hallucinations, memory loss and substance abuse. The patient does not have insomnia.     Past Medical  History:  Diagnosis Date   Cancer [facility])    BREAST CANCER RIGHT BREAST   Chronic cough    Family history of breast cancer    GERD (gastroesophageal reflux disease)    Hyperlipidemia    IBS (irritable bowel syndrome)    sees Dr. Silvano Rusk    Routine gynecological examination    sees Dr. Freda Munro    Past Surgical History:  Procedure Laterality Date   CESAREAN SECTION     MASTECTOMY W/ SENTINEL NODE BIOPSY Right 03/04/2019   Procedure: RIGHT MASTECTOMY WITH RIGHT AXILLARY SENTINEL LYMPH NODE BIOPSY;  Surgeon: Rolm Bookbinder, MD;  Location: [place];  Service: General;  Laterality: Right;   PORTACATH PLACEMENT Right 11/14/2018   Procedure: INSERTION PORT-A-CATH WITH ULTRASOUND;  Surgeon: Rolm Bookbinder, MD;  Location: [place];  Service: General;  Laterality: Right;   ROOT CANAL       reports that she has never smoked. She has never used smokeless tobacco. She reports that she does not drink alcohol or use drugs.  Allergies  Allergen Reactions   Barbiturates Other (See Comments)    Keeps pt awake   Methocarbamol Itching   Sulfonamide Derivatives Itching    Family History  Problem Relation Age of Onset   Hypertension Other    Breast cancer Other        mat first cousin's daughter   Breast cancer Maternal Aunt    Hypertension Mother    Breast cancer Maternal Aunt    Breast cancer Maternal Aunt     Prior to Admission medications   Medication Sig Start Date End Date Taking? Authorizing Provider  atorvastatin (LIPITOR) 20  MG tablet Take 1 tablet (20 mg total) by mouth daily. 05/31/18  Yes [name], MD  cyclobenzaprine (FLEXERIL) 10 MG tablet Take 1 tablet (10 mg total) by mouth 3 (three) times daily. 03/06/19  Yes Rolm Bookbinder, MD  gabapentin (NEURONTIN) 300 MG capsule Take 1 capsule (300 mg total) by mouth 3 (three) times daily. 04/18/19  Yes Florencia Reasons, MD  nystatin (MYCOSTATIN) 100000 UNIT/ML  suspension Take 5 mLs (500,000 Units total) by mouth 4 (four) times daily. 04/18/19  Yes Florencia Reasons, MD  oxyCODONE (OXY IR/ROXICODONE) 5 MG immediate release tablet Take 1 tablet (5 mg total) by mouth every 4 (four) hours as needed for moderate pain. 04/02/19  Yes Magrinat, [name], MD  potassium chloride (K-DUR) 10 MEQ tablet TAKE 1 TABLET BY MOUTH ONCE DAILY Patient taking differently: Take 10 mEq by mouth daily.  11/30/17  Yes [name], MD  lidocaine-prilocaine (EMLA) cream Apply to affected area once Patient taking differently: Apply 1 application topically daily as needed (numbing).  11/15/18   Magrinat, [name], MD  loratadine (CLARITIN) 10 MG tablet Take 1 tablet (10 mg total) by mouth daily. Patient not taking: Reported on 04/23/2019 11/15/18   Magrinat, [name], MD  metoprolol succinate (TOPROL-XL) 50 MG 24 hr tablet Take 1 tablet (50 mg total) by mouth daily. Take with or immediately following a meal. Patient not taking: Reported on 04/23/2019 04/09/19   [name], MD  Omeprazole-Sodium Bicarbonate (ZEGERID) 20-1100 MG CAPS capsule Take 1 capsule by mouth daily before breakfast. 05/31/18   [name], MD  polyethylene glycol (MIRALAX / Floria Raveling) packet Take 17 g by mouth as needed. Patient taking differently: Take 17 g by mouth daily as needed (constipation). Mix in 8 oz liquid and drink 04/23/12   [name], MD    Physical Exam: Vitals:   04/23/19 1545 04/23/19 1638 04/23/19 1649 04/23/19 1650  BP:   117/79   Pulse: (!) 144 (!) 144  (!) 142  Resp: 19 17 18 19   Temp:      TempSrc:      SpO2: 90% 90%  99%    Constitutional: NAD, calm, comfortable Vitals:   04/23/19 1545 04/23/19 1638 04/23/19 1649 04/23/19 1650  BP:   117/79   Pulse: (!) 144 (!) 144  (!) 142  Resp: 19 17 18 19   Temp:      TempSrc:      SpO2: 90% 90%  99%   Eyes: PERRL, lids and conjunctivae normal ENMT: Mucous membranes are moist. Posterior pharynx clear of any exudate or lesions.Normal  dentition.  Neck: normal, supple, no masses, no thyromegaly Respiratory:normal resp effort, no audible wheezing, shallow breaths Cardiovascular: tachycardic, regular, s1, s2 Abdomen: no tenderness, no masses palpated.. Bowel sounds positive.  Musculoskeletal: no clubbing / cyanosis. No joint deformity upper and lower extremities. Good ROM, no contractures. Normal muscle tone.  Skin: no rashes, lesions Neurologic: CN 2-12 grossly intact. Sensation intact, DTR normal. Strength 5/5 in all 4.  Psychiatric: seems drowsy, difficult to assess. Normal mood.    Labs on Admission: I have personally reviewed following labs and imaging studies  CBC: Recent Labs  Lab 04/23/19 0834 04/23/19 1309  WBC 9.7 10.1  NEUTROABS 7.4 7.2  HGB 11.3* 10.8*  HCT 37.8 35.6*  MCV 86.3 87.5  PLT 383 462   Basic Metabolic Panel: Recent Labs  Lab 04/23/19 0834 04/23/19 1309  NA 142 139  K 3.8 3.5  CL 102 101  CO2 28 27  GLUCOSE 155* 126*  BUN 9 8  CREATININE 0.93 0.62  CALCIUM 10.9* 9.9   GFR: Estimated Creatinine Clearance: 82.5 mL/min (by C-G formula based on SCr of 0.62 mg/dL). Liver Function Tests: Recent Labs  Lab 04/23/19 0834 04/23/19 1309  AST 9* 12*  ALT 39 36  ALKPHOS 128* 113  BILITOT 0.3 0.3  PROT 7.6 7.2  ALBUMIN 3.6 3.6   No results for input(s): LIPASE, AMYLASE in the last 168 hours. No results for input(s): AMMONIA in the last 168 hours. Coagulation Profile: No results for input(s): INR, PROTIME in the last 168 hours. Cardiac Enzymes: No results for input(s): CKTOTAL, CKMB, CKMBINDEX, TROPONINI in the last 168 hours. BNP (last 3 results) No results for input(s): PROBNP in the last 8760 hours. HbA1C: No results for input(s): HGBA1C in the last 72 hours. CBG: No results for input(s): GLUCAP in the last 168 hours. Lipid Profile: No results for input(s): CHOL, HDL, LDLCALC, TRIG, CHOLHDL, LDLDIRECT in the last 72 hours. Thyroid Function Tests: Recent Labs     04/23/19 1306  TSH 1.334   Anemia Panel: No results for input(s): VITAMINB12, FOLATE, FERRITIN, TIBC, IRON, RETICCTPCT in the last 72 hours. Urine analysis:    Component Value Date/Time   COLORURINE STRAW (A) 12/06/2018 1014   APPEARANCEUR CLEAR 12/06/2018 1014   LABSPEC 1.009 12/06/2018 1014   PHURINE 7.0 12/06/2018 1014   GLUCOSEU NEGATIVE 12/06/2018 1014   HGBUR SMALL (A) 12/06/2018 1014   BILIRUBINUR NEGATIVE 12/06/2018 1014   BILIRUBINUR negative 05/31/2018 1011   KETONESUR NEGATIVE 12/06/2018 1014   PROTEINUR NEGATIVE 12/06/2018 1014   UROBILINOGEN 0.2 05/31/2018 1011   UROBILINOGEN 0.2 05/18/2010 1042   NITRITE NEGATIVE 12/06/2018 1014   LEUKOCYTESUR NEGATIVE 12/06/2018 1014   Sepsis Labs: !!!!!!!!!!!!!!!!!!!!!!!!!!!!!!!!!!!!!!!!!!!! @LABRCNTIP (procalcitonin:4,lacticidven:4) ) Recent Results (from the past 240 hour(s))  MRSA PCR Screening     Status: None   Collection Time: 04/14/19  1:19 AM   Specimen: Nasal Mucosa; Nasopharyngeal  Result Value Ref Range Status   MRSA by PCR NEGATIVE NEGATIVE Final    Comment:        The GeneXpert MRSA Assay (FDA approved for NASAL specimens only), is one component of a comprehensive MRSA colonization surveillance program. It is not intended to diagnose MRSA infection nor to guide or monitor treatment for MRSA infections. Performed at [facility], [place] [address], [place], [place] [address]   Culture, blood (routine x 2)     Status: None   Collection Time: 04/14/19  1:50 AM   Specimen: BLOOD  Result Value Ref Range Status   Specimen Description   Final    BLOOD LEFT ANTECUBITAL Performed at [place] [address], [place], [place] [address]    Special Requests   Final    BOTTLES DRAWN AEROBIC ONLY Blood Culture adequate volume Performed at [place] [address], [place], [place] [address]    Culture   Final    NO GROWTH 5 DAYS Performed at [facility], [place] [address], [place], [place] [address]    Report Status 04/19/2019 FINAL  Final  Culture, blood (routine x 2)     Status: None   Collection Time: 04/14/19  1:50 AM   Specimen: BLOOD LEFT HAND  Result Value Ref Range Status   Specimen Description   Final    BLOOD LEFT HAND Performed at [facility], [place] [address] [place], [place] [address]  Special Requests   Final    BOTTLES DRAWN AEROBIC ONLY Blood Culture adequate volume Performed at [place] [address], [place], [place] [address]    Culture   Final    NO GROWTH 5 DAYS Performed at [facility], [place] [address] [place], [place] [address]    Report Status 04/19/2019 FINAL  Final  SARS Coronavirus 2 (CEPHEID - Performed in [place] hospital lab), Hosp Order     Status: None   Collection Time: 04/23/19  1:06 PM   Specimen: Nasopharyngeal Swab  Result Value Ref Range Status   SARS Coronavirus 2 NEGATIVE NEGATIVE Final    Comment: (NOTE) If result is NEGATIVE SARS-CoV-2 target nucleic acids are NOT DETECTED. The SARS-CoV-2 RNA is generally detectable in upper and lower  respiratory specimens during the acute phase of infection. The lowest  concentration of SARS-CoV-2 viral copies this assay can detect is 250  copies / mL. A negative result does not preclude SARS-CoV-2 infection  and should not be used as the sole basis for treatment or other  patient management decisions.  A negative result may occur with  improper specimen collection / handling, submission of specimen other  than nasopharyngeal swab, presence of viral mutation(s) within the  areas targeted by this assay, and inadequate number of viral copies  (<250 copies / mL). A negative result must be combined with clinical  observations, patient history, and epidemiological information. If result is POSITIVE SARS-CoV-2 target nucleic acids are DETECTED. The SARS-CoV-2 RNA is generally detectable in upper and  lower  respiratory specimens dur ing the acute phase of infection.  Positive  results are indicative of active infection with SARS-CoV-2.  Clinical  correlation with patient history and other diagnostic information is  necessary to determine patient infection status.  Positive results do  not rule out bacterial infection or co-infection with other viruses. If result is PRESUMPTIVE POSTIVE SARS-CoV-2 nucleic acids MAY BE PRESENT.   A presumptive positive result was obtained on the submitted specimen  and confirmed on repeat testing.  While 2019 novel coronavirus  (SARS-CoV-2) nucleic acids may be present in the submitted sample  additional confirmatory testing may be necessary for epidemiological  and / or clinical management purposes  to differentiate between  SARS-CoV-2 and other Sarbecovirus currently known to infect humans.  If clinically indicated additional testing with an alternate test  methodology [phone]) is advised. The SARS-CoV-2 RNA is generally  detectable in upper and lower respiratory sp ecimens during the acute  phase of infection. The expected result is Negative. Fact Sheet for Patients:  StrictlyIdeas.no Fact Sheet for Healthcare Providers: BankingDealers.co.za This test is not yet approved or cleared by the [place] FDA and has been authorized for detection and/or diagnosis of SARS-CoV-2 by FDA under an Emergency Use Authorization (EUA).  This EUA will remain in effect (meaning this test can be used) for the duration of the COVID-19 declaration under Section 564(b)(1) of the Act, 21 U.S.C. section 360bbb-3(b)(1), unless the authorization is terminated or revoked sooner. Performed at [facility], [place] [address] [place], [place] [address]      Radiological Exams on Admission: Dg Chest 2 View  Result Date: 04/23/2019 CLINICAL DATA:  Shortness of breath, tachycardia and lethargy. History of  metastatic breast cancer. EXAM: CHEST - 2 VIEW COMPARISON:  Chest x-ray 04/16/2019 FINDINGS: The power port is stable. Persistent right lower lobe process which appears to be a combination effusion and atelectasis based on  prior chest CT. There is also persistent streaky left basilar atelectasis. IMPRESSION: 1. Stable/persistent right lower lobe process likely combination of effusion and atelectasis. 2. Stable streaky left basilar atelectasis. Electronically Signed   By: Marijo Sanes M.D.   On: 04/23/2019 11:27   Ct Angio Chest Pe W/cm &/or Wo Cm  Result Date: 04/23/2019 CLINICAL DATA:  Shortness of breath, history of metastatic breast cancer EXAM: CT ANGIOGRAPHY CHEST WITH CONTRAST TECHNIQUE: Multidetector CT imaging of the chest was performed using the standard protocol during bolus administration of intravenous contrast. Multiplanar CT image reconstructions and MIPs were obtained to evaluate the vascular anatomy. CONTRAST:  13mL OMNIPAQUE IOHEXOL 350 MG/ML SOLN COMPARISON:  04/11/2019 FINDINGS: Cardiovascular: Satisfactory opacification of the pulmonary arteries to the segmental level. No evidence of pulmonary embolism. Normal heart size. No pericardial effusion. Right chest port catheter. Mediastinum/Nodes: No enlarged mediastinal, hilar, or axillary lymph nodes. Thyroid gland, trachea, and esophagus demonstrate no significant findings. Lungs/Pleura: Moderate to large right pleural effusion and associated atelectasis or consolidation, slightly increased compared to prior examination. There are multiple small bilateral pulmonary nodules, which are new or enlarged compared to prior examination, for example 5 mm nodule of the left upper lobe (series 10, image 37). Upper Abdomen: No acute abnormality. Musculoskeletal: Status post right mastectomy. There is a redemonstrated destructive soft tissue lesion of the anterior right chest wall and pectoral musculature, which is difficult to distinguish from adjacent  pleural fluid and mostly appreciated by the destructive effect on the right anterior fourth rib. There has been interval enlargement of adjacent soft tissue nodularity of the chest wall (e.g. Series 5, image 166) Review of the MIP images confirms the above findings. IMPRESSION: 1.  Negative examination for pulmonary embolism. 2. Moderate to large right pleural effusion and associated atelectasis or consolidation, slightly increased compared to prior examination. 3. There are multiple small bilateral pulmonary nodules, which are new or enlarged compared to prior examination, for example 5 mm nodule of the left upper lobe (series 10, image 37). Findings are most consistent with pulmonary metastatic disease, although atypical infection is not excluded given very rapid interval development in comparison to CT dated 04/11/2019. 4. Status post right mastectomy. There is a redemonstrated destructive soft tissue lesion of the anterior right chest wall and pectoral musculature, which is difficult to distinguish from adjacent pleural fluid and mostly appreciated by the destructive effect on the right anterior fourth rib. There has been interval enlargement of adjacent soft tissue nodularity of the chest wall (e.g. Series 5, image 166) Electronically Signed   By: Eddie Candle M.D.   On: 04/23/2019 15:22    EKG: Independently reviewed. Sinus tach  Assessment/Plan Principal Problem:   Pleural effusion Active Problems:   IBS   Hyperlipidemia   Chronic right hip pain   Malignant neoplasm of lower-outer quadrant of right breast of female, estrogen receptor negative (HCC)   S/P mastectomy   Pleural effusion on right   1. Large pleural effusion 1. Currently on minimal O2 support 2. CT personally reviewed. Mod to large R pleural effusion, worse compared to prior study noted 3. IR consulted by EDP for consideration for thoracentesis 2. Hx malignant breast cancer, s/p mastectomy 1. Followed by [name] 2. New  chest wall mass and liver lesion noted recently 3. Have consulted IR regarding possibility for biopsy 4. F/u on cytology of above effusion 3. HLD 1. Seems stable. 2. Cont statin per home regimen 4. Sinus tach 1. Hr in the 140's 2. Improved  with beta blocker. Will continue as tolerated 5. Hx IBS 1. Seems stable at this time 6. Chronic pain 1. Will continue home pain regimen  DVT prophylaxis: Lovenox subQ  Code Status: Full Family Communication: Pt in room, family not at bedside  Disposition Plan: Uncertain at this time  Consults called: IR Admission status: Inpatient, as would likely require greater than 2 midnight stay to address large effusion   Marylu Lund MD [facility] Pager On Amion  If 7PM-7AM, please contact night-coverage  04/23/2019, 5:50 PM

## 2019-04-23 NOTE — ED Notes (Signed)
Pt informed urine sample is needed.

## 2019-04-23 NOTE — ED Provider Notes (Signed)
[facility] DEPT Provider Note   CSN: [id number] Arrival date & time: 04/23/19  1002     History   Chief Complaint Chief Complaint  Patient presents with   Tachycardia    HPI Brittany Rodgers is a 48 y.o. female with a hx of breast cancer s/p mastectomy & sentinel node bx currently receiving pembrolizumab q21 days, hyperlipidemia, IBS, anemia, & R pleural effusion who presents to the ED @ the request of her oncologist for evaluation of tachycardia today & sxs of dyspnea x 2 days.  Patient recently discharged from the hospital 5 days ago, was initially doing well, but unfortunately within the past 48 hours has developed dyspnea & fatigue. States sxs are fairly constant, worse with exertion, no alleviating factors. She has had continued chest pain @ incision site to R side which is not new, currently 10/10 in severity, worse with palpation, no acute change in the past 48 hours. Denies fever, chills, cough, hemoptysis, leg pain/swelling, hemoptysis, recent surgery/trauma, recent long travel, or hx of DVT/PE.   Recent hospital admission 04/11/19-04/18/19, had CTA negative for PE, extensive pleural effusion noted that appeared complex & possibly loculated- s/p thoracentesis 7/18- reactive cells no malignant cells or growth to culture, did not have cytology.  Discharged home w/ outpatient follow up. Today was seen at her oncology office, noted to be tachycardic to 145 with her vitals & sxs was sent to the ED for further assessment/management.  She will need a biopsy of her liver and of her mass in her chest wall at some point.  This can be done as inpatient or outpatient.        HPI  Past Medical History:  Diagnosis Date   Cancer ([place])    BREAST CANCER RIGHT BREAST   Chronic cough    Family history of breast cancer    GERD (gastroesophageal reflux disease)    Hyperlipidemia    IBS (irritable bowel syndrome)    sees Dr. Silvano Rusk    Routine  gynecological examination    sees Dr. Freda Munro    Patient Active Problem List   Diagnosis Date Noted   Pleural effusion on right 04/11/2019   Breast cancer, right ([place]) 03/04/2019   S/P mastectomy 03/04/2019   Anemia due to antineoplastic chemotherapy 02/04/2019   Port-A-Cath in place 01/07/2019   Family history of breast cancer    Malignant neoplasm of lower-outer quadrant of right breast of female, estrogen receptor negative ([place]) 11/04/2018   Thoracic spine pain 05/03/2018   Hypokalemia 02/13/2017   Chronic right hip pain 11/26/2015   Hyperlipidemia 12/23/2010   ALLERGIC RHINITIS CAUSE UNSPECIFIED 07/24/2008   GERD 10/14/2007   IBS 10/14/2007    Past Surgical History:  Procedure Laterality Date   CESAREAN SECTION     MASTECTOMY W/ SENTINEL NODE BIOPSY Right 03/04/2019   Procedure: RIGHT MASTECTOMY WITH RIGHT AXILLARY SENTINEL LYMPH NODE BIOPSY;  Surgeon: Rolm Bookbinder, MD;  Location: [facility];  Service: General;  Laterality: Right;   PORTACATH PLACEMENT Right 11/14/2018   Procedure: INSERTION PORT-A-CATH WITH ULTRASOUND;  Surgeon: Rolm Bookbinder, MD;  Location: [facility];  Service: General;  Laterality: Right;   ROOT CANAL       OB History   No obstetric history on file.      Home Medications    Prior to Admission medications   Medication Sig Start Date End Date Taking? Authorizing Provider  atorvastatin (LIPITOR) 20 MG tablet Take 1 tablet (20  mg total) by mouth daily. 05/31/18   [name], MD  cyclobenzaprine (FLEXERIL) 10 MG tablet Take 1 tablet (10 mg total) by mouth 3 (three) times daily. 03/06/19   Rolm Bookbinder, MD  gabapentin (NEURONTIN) 300 MG capsule Take 1 capsule (300 mg total) by mouth 3 (three) times daily. 04/18/19   Florencia Reasons, MD  lidocaine-prilocaine (EMLA) cream Apply to affected area once Patient taking differently: Apply 1 application topically daily as needed (numbing).  11/15/18    Magrinat, [name], MD  loratadine (CLARITIN) 10 MG tablet Take 1 tablet (10 mg total) by mouth daily. 11/15/18   Magrinat, [name], MD  metoprolol succinate (TOPROL-XL) 50 MG 24 hr tablet Take 1 tablet (50 mg total) by mouth daily. Take with or immediately following a meal. Patient not taking: Reported on 04/23/2019 04/09/19   [name], MD  nystatin (MYCOSTATIN) 100000 UNIT/ML suspension Take 5 mLs (500,000 Units total) by mouth 4 (four) times daily. 04/18/19   Florencia Reasons, MD  Omeprazole-Sodium Bicarbonate (ZEGERID) 20-1100 MG CAPS capsule Take 1 capsule by mouth daily before breakfast. 05/31/18   [name], MD  oxyCODONE (OXY IR/ROXICODONE) 5 MG immediate release tablet Take 1 tablet (5 mg total) by mouth every 4 (four) hours as needed for moderate pain. 04/02/19   Magrinat, [name], MD  polyethylene glycol (MIRALAX / GLYCOLAX) packet Take 17 g by mouth as needed. Patient taking differently: Take 17 g by mouth daily as needed (constipation). Mix in 8 oz liquid and drink 04/23/12   [name], MD  potassium chloride (K-DUR) 10 MEQ tablet TAKE 1 TABLET BY MOUTH ONCE DAILY Patient taking differently: Take 10 mEq by mouth daily.  11/30/17   [name], MD    Family History Family History  Problem Relation Age of Onset   Hypertension Other    Breast cancer Other        mat first cousin's daughter   Breast cancer Maternal Aunt    Hypertension Mother    Breast cancer Maternal Aunt    Breast cancer Maternal Aunt     Social History Social History   Tobacco Use   Smoking status: Never Smoker   Smokeless tobacco: Never Used  Substance Use Topics   Alcohol use: No    Alcohol/week: 0.0 standard drinks   Drug use: No     Allergies   Barbiturates, Methocarbamol, and Sulfonamide derivatives   Review of Systems Review of Systems  Constitutional: Negative for chills and fever.  HENT: Negative for ear pain and sore throat.   Respiratory: Positive for shortness of  breath. Negative for cough.   Cardiovascular: Positive for chest pain. Negative for leg swelling.  Gastrointestinal: Negative for abdominal pain, diarrhea, nausea and vomiting.  Neurological: Negative for dizziness, syncope, weakness and numbness.  All other systems reviewed and are negative.    Physical Exam Updated Vital Signs BP (!) 152/106 (BP Location: Left Arm)    Pulse (!) 142    Temp 98.7 F (37.1 C) (Oral)    Resp 18    SpO2 94%   Physical Exam Vitals signs and nursing note reviewed.  Constitutional:      General: She is not in acute distress.    Appearance: She is well-developed. She is not toxic-appearing.  HENT:     Head: Normocephalic and atraumatic.  Eyes:     General:        Right eye: No discharge.  Left eye: No discharge.     Conjunctiva/sclera: Conjunctivae normal.  Neck:     Musculoskeletal: Neck supple.  Cardiovascular:     Rate and Rhythm: Regular rhythm. Tachycardia present.  Pulmonary:     Effort: Pulmonary effort is normal. No respiratory distress.     Breath sounds: No wheezing.     Comments: Decreased breath sounds @ R base Chest:     Comments: R chest wall surgical scar present as well as R chest wall port present. No overlying erythema or purulent drainage. R chest wall tender to palpation- reproduces patient's chest pain.  Abdominal:     General: There is no distension.     Palpations: Abdomen is soft.     Tenderness: There is no abdominal tenderness. There is no guarding or rebound.  Musculoskeletal:        General: No tenderness.     Right lower leg: No edema.     Left lower leg: No edema.  Skin:    General: Skin is warm and dry.     Findings: No rash.  Neurological:     Mental Status: She is alert.     Comments: Clear speech.   Psychiatric:        Behavior: Behavior normal.    ED Treatments / Results  Labs (all labs ordered are listed, but only abnormal results are displayed) Labs Reviewed  CBC WITH DIFFERENTIAL/PLATELET -  Abnormal; Notable for the following components:      Result Value   Hemoglobin 10.8 (*)    HCT 35.6 (*)    RDW 18.6 (*)    All other components within normal limits  COMPREHENSIVE METABOLIC PANEL - Abnormal; Notable for the following components:   Glucose, Bld 126 (*)    AST 12 (*)    All other components within normal limits  SARS CORONAVIRUS 2 (HOSPITAL ORDER, [facility])  TSH  URINALYSIS, ROUTINE W REFLEX MICROSCOPIC    EKG EKG Interpretation  Date/Time:  Wednesday April 23 2019 10:18:23 EDT Ventricular Rate:  143 PR Interval:    QRS Duration: 65 QT Interval:  289 QTC Calculation: 446 R Axis:   22 Text Interpretation:  Sinus tachycardia Nonspecific T abnrm, anterolateral leads Confirmed by Lennice Sites [phone]) on 04/23/2019 12:38:24 PM   Radiology Dg Chest 2 View  Result Date: 04/23/2019 CLINICAL DATA:  Shortness of breath, tachycardia and lethargy. History of metastatic breast cancer. EXAM: CHEST - 2 VIEW COMPARISON:  Chest x-ray 04/16/2019 FINDINGS: The power port is stable. Persistent right lower lobe process which appears to be a combination effusion and atelectasis based on prior chest CT. There is also persistent streaky left basilar atelectasis. IMPRESSION: 1. Stable/persistent right lower lobe process likely combination of effusion and atelectasis. 2. Stable streaky left basilar atelectasis. Electronically Signed   By: Marijo Sanes M.D.   On: 04/23/2019 11:27   Ct Angio Chest Pe W/cm &/or Wo Cm  Result Date: 04/23/2019 CLINICAL DATA:  Shortness of breath, history of metastatic breast cancer EXAM: CT ANGIOGRAPHY CHEST WITH CONTRAST TECHNIQUE: Multidetector CT imaging of the chest was performed using the standard protocol during bolus administration of intravenous contrast. Multiplanar CT image reconstructions and MIPs were obtained to evaluate the vascular anatomy. CONTRAST:  127mL OMNIPAQUE IOHEXOL 350 MG/ML SOLN COMPARISON:  04/11/2019  FINDINGS: Cardiovascular: Satisfactory opacification of the pulmonary arteries to the segmental level. No evidence of pulmonary embolism. Normal heart size. No pericardial effusion. Right chest port catheter. Mediastinum/Nodes: No enlarged mediastinal,  hilar, or axillary lymph nodes. Thyroid gland, trachea, and esophagus demonstrate no significant findings. Lungs/Pleura: Moderate to large right pleural effusion and associated atelectasis or consolidation, slightly increased compared to prior examination. There are multiple small bilateral pulmonary nodules, which are new or enlarged compared to prior examination, for example 5 mm nodule of the left upper lobe (series 10, image 37). Upper Abdomen: No acute abnormality. Musculoskeletal: Status post right mastectomy. There is a redemonstrated destructive soft tissue lesion of the anterior right chest wall and pectoral musculature, which is difficult to distinguish from adjacent pleural fluid and mostly appreciated by the destructive effect on the right anterior fourth rib. There has been interval enlargement of adjacent soft tissue nodularity of the chest wall (e.g. Series 5, image 166) Review of the MIP images confirms the above findings. IMPRESSION: 1.  Negative examination for pulmonary embolism. 2. Moderate to large right pleural effusion and associated atelectasis or consolidation, slightly increased compared to prior examination. 3. There are multiple small bilateral pulmonary nodules, which are new or enlarged compared to prior examination, for example 5 mm nodule of the left upper lobe (series 10, image 37). Findings are most consistent with pulmonary metastatic disease, although atypical infection is not excluded given very rapid interval development in comparison to CT dated 04/11/2019. 4. Status post right mastectomy. There is a redemonstrated destructive soft tissue lesion of the anterior right chest wall and pectoral musculature, which is difficult to  distinguish from adjacent pleural fluid and mostly appreciated by the destructive effect on the right anterior fourth rib. There has been interval enlargement of adjacent soft tissue nodularity of the chest wall (e.g. Series 5, image 166) Electronically Signed   By: Eddie Candle M.D.   On: 04/23/2019 15:22    Procedures Procedures (including critical care time)  Medications Ordered in ED Medications  sodium chloride (PF) 0.9 % injection (has no administration in time range)  morphine 4 MG/ML injection 4 mg (4 mg Intravenous Given 04/23/19 1257)  ondansetron (ZOFRAN) injection 4 mg (4 mg Intravenous Given 04/23/19 1257)  iohexol (OMNIPAQUE) 350 MG/ML injection 100 mL (100 mLs Intravenous Contrast Given 04/23/19 1442)  HYDROmorphone (DILAUDID) injection 1 mg (1 mg Intravenous Given 04/23/19 1541)  metoprolol tartrate (LOPRESSOR) injection 5 mg (5 mg Intravenous Given 04/23/19 1651)    Initial Impression / Assessment and Plan / ED Course  I have reviewed the triage vital signs and the nursing notes.  Pertinent labs & imaging results that were available during my care of the patient were reviewed by me and considered in my medical decision making (see chart for details).   Patient w/ hx of breast cancer s/p mastectomy & sentinel node bx currently receiving pembrolizumab q21 days with recent admission for pleural effusion s/p thoracentesis 07/18 who presents to the ED from her oncologist office for dyspnea w/ tachycardia. Patient is nontoxic appearing, resting fairly comfortably, & is notably tachycardic in the 140s on arrival- appears consistent w/ sinus tach, BP somewhat elevated, SpO2 92-96% on RA not overly tachypnic. On exam her breath sound are decreased to R lower lung field. She has R chest wall incision consistent w/ mastectomy that does not appear grossly infected- this area is tender to palpation however. DDX: worsened pleural effusion, PE, PTX, pneumonia, critical anemia, metastatic disease.  Will proceed w/ basic labs & TSH as well as CXR. Morphine ordered for pain control.   EKG: Sinus tachycardia Nonspecific T abnrm, anterolateral leads  Chest x-ray was stable/persistent right lower lobe process likely  commendation of effusion/atelectasis, stable streaky left basilar atelectasis present as well---> no significant change from her prior chest x-rays to explain her increased tachycardia and symptoms, will proceed with CTA of the chest for further assessment & to r/o PE.   CBC: No leukocytosis. Anemia consistent w/ prior ranges.  CMP: Mild hyperglycemia @ 125. No significant electrolyte derangement.  TSH: WNL CTA: Negative for PE. Moderate to large R pleural effusion w/ associated atelectasis or consolidation- slightly increased compared to prior. Pulmonary nodules bilaterally which are new vs. Enlarged compared to prior - concern for metastatic disease, possibly atypical infection. Status post right mastectomy. There is a redemonstrated destructive soft tissue lesion of the anterior right chest wall and pectoral musculature, which is difficult to distinguish from adjacent pleural fluid and mostly appreciated by the destructive effect on the right anterior fourth rib. There has been interval enlargement of adjacent soft tissue nodularity of the chest wall.   Patient persistently tachycardic in the 82s- old EKGs reviewed again w/ Dr. Ronnald Nian, PCP had thought patient was in aflutter and started her on metoprolol which she states she reports she has not been taking. Still believe this to be a sinus tachycardia as initially thought, but also considering atrial flutter, will discuss w/ cardiology.   16:23: CONSULT: Discussed w/ cardiologist Dr. Acie Fredrickson- suspect sinus tachycardia likely from her pre-existing conditions; recommends 5 mg of IV metoprolol initially- would not expect her HR to completely normalize.  Plan for admission for her dyspnea likely related to her pleural effusion which is  somewhat worse, also w/ pulmonary nodules concerning for mets vs. Atypical infection. Per oncology note she will also need a biopsy of her liver and of her mass in her chest wall at some point.  This can be done as inpatient or outpatient.    16:49: CONSULT: Discussed w/ hospitalist Dr. Wyline Copas who accepts admission.   17:00: HR improved to 115 following Metoprolol. Nursing staff placed patient on 1.5 L L via [place] as she desaturated to upper 80s- resting comfortably @ 98% on 1.5L of oxygen.     17:10: CONSULT: Discussed with IR Dr. Laurence Ferrari - IR to perform thoracentesis.   This is a shared visit with supervising physician Dr. Ronnald Nian who has independently evaluated patient & provided guidance in evaluation/management/disposition, in agreement with care    Final Clinical Impressions(s) / ED Diagnoses   Final diagnoses:  Pleural effusion  Tachycardia    ED Discharge Orders    None       Amaryllis Dyke, PA-C 04/23/19 [place], [place], DO 04/24/19 [phone]

## 2019-04-23 NOTE — Telephone Encounter (Signed)
Spoke with patient she stated the we are unable to extend her FMLA and that new forms have to be completed. She stated that new forms will be sent over to the office. Nothing further needed at this time.

## 2019-04-24 ENCOUNTER — Inpatient Hospital Stay (HOSPITAL_COMMUNITY): Payer: BC Managed Care – PPO

## 2019-04-24 ENCOUNTER — Encounter (HOSPITAL_COMMUNITY): Payer: Self-pay | Admitting: Radiology

## 2019-04-24 DIAGNOSIS — C50511 Malignant neoplasm of lower-outer quadrant of right female breast: Secondary | ICD-10-CM

## 2019-04-24 DIAGNOSIS — J90 Pleural effusion, not elsewhere classified: Secondary | ICD-10-CM

## 2019-04-24 LAB — URINALYSIS, ROUTINE W REFLEX MICROSCOPIC
Bilirubin Urine: NEGATIVE
Glucose, UA: NEGATIVE mg/dL
Hgb urine dipstick: NEGATIVE
Ketones, ur: NEGATIVE mg/dL
Nitrite: NEGATIVE
Protein, ur: NEGATIVE mg/dL
Specific Gravity, Urine: 1.025 (ref 1.005–1.030)
WBC, UA: >50 WBC/hpf — ABNORMAL HIGH (ref 0–5)
pH: 5 (ref 5.0–8.0)

## 2019-04-24 LAB — CBC
HCT: 32.4 % — ABNORMAL LOW (ref 36.0–46.0)
Hemoglobin: 9.4 g/dL — ABNORMAL LOW (ref 12.0–15.0)
MCH: 25.8 pg — ABNORMAL LOW (ref 26.0–34.0)
MCHC: 29 g/dL — ABNORMAL LOW (ref 30.0–36.0)
MCV: 88.8 fL (ref 80.0–100.0)
Platelets: 319 10*3/uL (ref 150–400)
RBC: 3.65 MIL/uL — ABNORMAL LOW (ref 3.87–5.11)
RDW: 18.6 % — ABNORMAL HIGH (ref 11.5–15.5)
WBC: 7.5 10*3/uL (ref 4.0–10.5)
nRBC: 0 % (ref 0.0–0.2)

## 2019-04-24 LAB — GRAM STAIN

## 2019-04-24 LAB — COMPREHENSIVE METABOLIC PANEL
ALT: 28 U/L (ref 0–44)
AST: 10 U/L — ABNORMAL LOW (ref 15–41)
Albumin: 3.1 g/dL — ABNORMAL LOW (ref 3.5–5.0)
Alkaline Phosphatase: 95 U/L (ref 38–126)
Anion gap: 9 (ref 5–15)
BUN: 10 mg/dL (ref 6–20)
CO2: 27 mmol/L (ref 22–32)
Calcium: 9.4 mg/dL (ref 8.9–10.3)
Chloride: 104 mmol/L (ref 98–111)
Creatinine, Ser: 0.68 mg/dL (ref 0.44–1.00)
GFR calc Af Amer: >60 mL/min (ref >60)
GFR calc non Af Amer: >60 mL/min (ref >60)
Glucose, Bld: 107 mg/dL — ABNORMAL HIGH (ref 70–99)
Potassium: 3.9 mmol/L (ref 3.5–5.1)
Sodium: 140 mmol/L (ref 135–145)
Total Bilirubin: 0.4 mg/dL (ref 0.3–1.2)
Total Protein: 6.4 g/dL — ABNORMAL LOW (ref 6.5–8.1)

## 2019-04-24 LAB — LACTIC ACID, PLASMA
Lactic Acid, Venous: 2.1 mmol/L (ref 0.5–1.9)
Lactic Acid, Venous: 1.9 mmol/L (ref 0.5–1.9)

## 2019-04-24 LAB — LACTATE DEHYDROGENASE, PLEURAL OR PERITONEAL FLUID: LD, Fluid: 2023 U/L — ABNORMAL HIGH (ref 3–23)

## 2019-04-24 LAB — PROTEIN, PLEURAL OR PERITONEAL FLUID: Total protein, fluid: 4.4 g/dL

## 2019-04-24 MED ORDER — DOCUSATE SODIUM 100 MG PO CAPS
200.0000 mg | ORAL_CAPSULE | Freq: Two times a day (BID) | ORAL | Status: DC
  Administered 2019-04-24 – 2019-05-09 (×21): 200 mg via ORAL
  Filled 2019-04-24 (×27): qty 2

## 2019-04-24 MED ORDER — FLUMAZENIL 0.5 MG/5ML IV SOLN
INTRAVENOUS | Status: AC
  Filled 2019-04-24: qty 5

## 2019-04-24 MED ORDER — HEPARIN SOD (PORK) LOCK FLUSH 100 UNIT/ML IV SOLN
INTRAVENOUS | Status: AC
  Administered 2019-04-24: 500 [IU]
  Filled 2019-04-24: qty 5

## 2019-04-24 MED ORDER — LACTATED RINGERS IV BOLUS
1000.0000 mL | Freq: Once | INTRAVENOUS | Status: AC
  Administered 2019-04-24: 19:00:00 1000 mL via INTRAVENOUS

## 2019-04-24 MED ORDER — HYDROCODONE-ACETAMINOPHEN 5-325 MG PO TABS
1.0000 | ORAL_TABLET | ORAL | Status: DC | PRN
  Administered 2019-04-25: 1 via ORAL
  Filled 2019-04-24: qty 1

## 2019-04-24 MED ORDER — MORPHINE SULFATE (PF) 2 MG/ML IV SOLN
1.0000 mg | Freq: Once | INTRAVENOUS | Status: AC
  Administered 2019-04-24: 1 mg via INTRAVENOUS
  Filled 2019-04-24: qty 1

## 2019-04-24 MED ORDER — LACTATED RINGERS IV BOLUS
1000.0000 mL | Freq: Once | INTRAVENOUS | Status: AC
  Administered 2019-04-24: 1000 mL via INTRAVENOUS

## 2019-04-24 MED ORDER — FENTANYL CITRATE (PF) 100 MCG/2ML IJ SOLN
INTRAMUSCULAR | Status: AC | PRN
  Administered 2019-04-24: 25 ug via INTRAVENOUS
  Administered 2019-04-24: 50 ug via INTRAVENOUS

## 2019-04-24 MED ORDER — FENTANYL CITRATE (PF) 100 MCG/2ML IJ SOLN
INTRAMUSCULAR | Status: AC
  Filled 2019-04-24: qty 2

## 2019-04-24 MED ORDER — POLYETHYLENE GLYCOL 3350 17 G PO PACK: 17.0000 g | PACK | Freq: Every day | ORAL | Status: DC

## 2019-04-24 MED ORDER — MORPHINE SULFATE ER 15 MG PO TBCR
15.0000 mg | EXTENDED_RELEASE_TABLET | Freq: Two times a day (BID) | ORAL | Status: DC
  Administered 2019-04-24 – 2019-05-05 (×23): 15 mg via ORAL
  Filled 2019-04-24 (×23): qty 1

## 2019-04-24 MED ORDER — METOPROLOL TARTRATE 25 MG PO TABS
25.0000 mg | ORAL_TABLET | Freq: Two times a day (BID) | ORAL | Status: DC
  Administered 2019-04-24 – 2019-05-07 (×26): 25 mg via ORAL
  Filled 2019-04-24 (×26): qty 1

## 2019-04-24 MED ORDER — MORPHINE SULFATE (PF) 4 MG/ML IV SOLN
4.0000 mg | Freq: Once | INTRAVENOUS | Status: AC
  Administered 2019-04-24: 4 mg via INTRAVENOUS
  Filled 2019-04-24: qty 1

## 2019-04-24 MED ORDER — SODIUM CHLORIDE 0.9 % IV SOLN
2.0000 g | INTRAVENOUS | Status: AC
  Administered 2019-04-24: 2 g via INTRAVENOUS
  Filled 2019-04-24: qty 2

## 2019-04-24 MED ORDER — LACTATED RINGERS IV BOLUS: 1000.0000 mL | Freq: Once | INTRAVENOUS | Status: DC

## 2019-04-24 MED ORDER — NALOXONE HCL 0.4 MG/ML IJ SOLN
INTRAMUSCULAR | Status: AC
  Filled 2019-04-24: qty 1

## 2019-04-24 MED ORDER — SODIUM CHLORIDE 0.9 % IV SOLN
2.0000 g | Freq: Three times a day (TID) | INTRAVENOUS | Status: AC
  Administered 2019-04-24 – 2019-04-30 (×18): 2 g via INTRAVENOUS
  Filled 2019-04-24 (×21): qty 2

## 2019-04-24 MED ORDER — MORPHINE SULFATE 15 MG PO TABS
15.0000 mg | ORAL_TABLET | ORAL | Status: DC | PRN
  Administered 2019-04-25 – 2019-05-10 (×33): 15 mg via ORAL
  Filled 2019-04-24 (×37): qty 1

## 2019-04-24 MED ORDER — POLYETHYLENE GLYCOL 3350 17 G PO PACK
17.0000 g | PACK | Freq: Two times a day (BID) | ORAL | Status: DC
  Administered 2019-04-24 – 2019-05-09 (×18): 17 g via ORAL
  Filled 2019-04-24 (×27): qty 1

## 2019-04-24 MED ORDER — MIDAZOLAM HCL 2 MG/2ML IJ SOLN
INTRAMUSCULAR | Status: AC
  Filled 2019-04-24: qty 4

## 2019-04-24 MED ORDER — SODIUM CHLORIDE 0.9% FLUSH
10.0000 mL | INTRAVENOUS | Status: DC | PRN
  Administered 2019-05-06: 04:00:00 10 mL
  Filled 2019-04-24: qty 40

## 2019-04-24 MED ORDER — MIDAZOLAM HCL 2 MG/2ML IJ SOLN
INTRAMUSCULAR | Status: AC | PRN
  Administered 2019-04-24: 1 mg via INTRAVENOUS

## 2019-04-24 MED ORDER — MORPHINE SULFATE (PF) 4 MG/ML IV SOLN
INTRAVENOUS | Status: AC
  Administered 2019-04-24: 1 mg via INTRAVENOUS
  Filled 2019-04-24: qty 1

## 2019-04-24 MED ORDER — CHLORHEXIDINE GLUCONATE CLOTH 2 % EX PADS
6.0000 | MEDICATED_PAD | Freq: Every day | CUTANEOUS | Status: DC
  Administered 2019-04-28 – 2019-05-01 (×4): 6 via TOPICAL

## 2019-04-24 MED ORDER — VANCOMYCIN HCL 10 G IV SOLR
1750.0000 mg | INTRAVENOUS | Status: AC
  Administered 2019-04-24: 1750 mg via INTRAVENOUS
  Filled 2019-04-24: qty 1750

## 2019-04-24 MED ORDER — LIDOCAINE HCL 1 % IJ SOLN
INTRAMUSCULAR | Status: AC
  Filled 2019-04-24: qty 20

## 2019-04-24 MED ORDER — LIDOCAINE HCL (PF) 1 % IJ SOLN
INTRAMUSCULAR | Status: AC | PRN
  Administered 2019-04-24: 5 mL

## 2019-04-24 MED ORDER — ENOXAPARIN SODIUM 40 MG/0.4ML ~~LOC~~ SOLN
40.0000 mg | SUBCUTANEOUS | Status: DC
  Administered 2019-04-25 – 2019-04-30 (×6): 40 mg via SUBCUTANEOUS
  Filled 2019-04-24 (×6): qty 0.4

## 2019-04-24 MED ORDER — VANCOMYCIN HCL IN DEXTROSE 750-5 MG/150ML-% IV SOLN
750.0000 mg | Freq: Two times a day (BID) | INTRAVENOUS | Status: DC
  Administered 2019-04-25: 04:00:00 750 mg via INTRAVENOUS
  Filled 2019-04-24 (×2): qty 150

## 2019-04-24 MED ORDER — POLYETHYLENE GLYCOL 3350 17 G PO PACK: 17.0000 g | PACK | Freq: Two times a day (BID) | ORAL | Status: DC

## 2019-04-24 NOTE — Consult Note (Signed)
Chief Complaint: Patient was seen in consultation today for right chest wall lesion/biopsy.  Referring Physician(s): Donne Hazel  Supervising Physician: Arne Cleveland  Patient Status: [facility] - In-pt  History of Present Illness: Brittany Rodgers is a 48 y.o. female with a past medical history of hyperlipidemia, GERD, IBS, and breast cancer s/p mastectomy. She presented to [facility] ED 04/23/2019 at the request of her oncologist for evaluation of tachycardia and dyspnea x 2 days. Of note, she was recently admitted to [facility] 04/11/2019 to 04/18/2019 for management of dyspnea. In ED, HR in excess of 140s (improved s/p IV metoprolol to 110s) and CT chest revealed large right pleural effusion and right chest wall lesion. She was admitted for further management. She underwent a right thoracentesis this AM in IR removing 650 mL of dark red fluid.  CT chest 04/23/2019: 1.  Negative examination for pulmonary embolism. 2. Moderate to large right pleural effusion and associated atelectasis or consolidation, slightly increased compared to prior examination. 3. There are multiple small bilateral pulmonary nodules, which are new or enlarged compared to prior examination, for example 5 mm nodule of the left upper lobe (series 10, image 37). Findings are most consistent with pulmonary metastatic disease, although atypical infection is not excluded given very rapid interval development in comparison to CT dated 04/11/2019. 4. Status post right mastectomy. There is a redemonstrated destructive soft tissue lesion of the anterior right chest wall and pectoral musculature, which is difficult to distinguish from adjacent pleural fluid and mostly appreciated by the destructive effect on the right anterior fourth rib. There has been interval enlargement of adjacent soft tissue nodularity of the chest wall (e.g. Series 5, image 166).  IR requested by Dr. Wyline Copas for possible image-guided right chest wall lesion biopsy. Patient  awake and alert sitting on stretcher for thoracentesis. Complains of dyspnea, stable since admission. Denies fever, chills, chest pain, abdominal pain, or headache.   Past Medical History:  Diagnosis Date   Cancer [facility])    BREAST CANCER RIGHT BREAST   Chronic cough    Family history of breast cancer    GERD (gastroesophageal reflux disease)    Hyperlipidemia    IBS (irritable bowel syndrome)    sees Dr. Silvano Rusk    Routine gynecological examination    sees Dr. Freda Munro    Past Surgical History:  Procedure Laterality Date   CESAREAN SECTION     MASTECTOMY W/ SENTINEL NODE BIOPSY Right 03/04/2019   Procedure: RIGHT MASTECTOMY WITH RIGHT AXILLARY SENTINEL LYMPH NODE BIOPSY;  Surgeon: Rolm Bookbinder, MD;  Location: [place];  Service: General;  Laterality: Right;   PORTACATH PLACEMENT Right 11/14/2018   Procedure: INSERTION PORT-A-CATH WITH ULTRASOUND;  Surgeon: Rolm Bookbinder, MD;  Location: [place];  Service: General;  Laterality: Right;   ROOT CANAL      Allergies: Barbiturates, Methocarbamol, and Sulfonamide derivatives  Medications: Prior to Admission medications   Medication Sig Start Date End Date Taking? Authorizing Provider  atorvastatin (LIPITOR) 20 MG tablet Take 1 tablet (20 mg total) by mouth daily. 05/31/18  Yes [name], MD  cyclobenzaprine (FLEXERIL) 10 MG tablet Take 1 tablet (10 mg total) by mouth 3 (three) times daily. 03/06/19  Yes Rolm Bookbinder, MD  gabapentin (NEURONTIN) 300 MG capsule Take 1 capsule (300 mg total) by mouth 3 (three) times daily. 04/18/19  Yes Florencia Reasons, MD  nystatin (MYCOSTATIN) 100000 UNIT/ML suspension Take 5 mLs (500,000 Units total) by mouth 4 (four)  times daily. 04/18/19  Yes Florencia Reasons, MD  oxyCODONE (OXY IR/ROXICODONE) 5 MG immediate release tablet Take 1 tablet (5 mg total) by mouth every 4 (four) hours as needed for moderate pain. 04/02/19  Yes Magrinat, [name], MD    potassium chloride (K-DUR) 10 MEQ tablet TAKE 1 TABLET BY MOUTH ONCE DAILY Patient taking differently: Take 10 mEq by mouth daily.  11/30/17  Yes [name], MD  lidocaine-prilocaine (EMLA) cream Apply to affected area once Patient taking differently: Apply 1 application topically daily as needed (numbing).  11/15/18   Magrinat, [name], MD  loratadine (CLARITIN) 10 MG tablet Take 1 tablet (10 mg total) by mouth daily. Patient not taking: Reported on 04/23/2019 11/15/18   Magrinat, [name], MD  metoprolol succinate (TOPROL-XL) 50 MG 24 hr tablet Take 1 tablet (50 mg total) by mouth daily. Take with or immediately following a meal. Patient not taking: Reported on 04/23/2019 04/09/19   [name], MD  Omeprazole-Sodium Bicarbonate (ZEGERID) 20-1100 MG CAPS capsule Take 1 capsule by mouth daily before breakfast. 05/31/18   [name], MD  polyethylene glycol (MIRALAX / Floria Raveling) packet Take 17 g by mouth as needed. Patient taking differently: Take 17 g by mouth daily as needed (constipation). Mix in 8 oz liquid and drink 04/23/12   [name], MD     Family History  Problem Relation Age of Onset   Hypertension Other    Breast cancer Other        mat first cousin's daughter   Breast cancer Maternal Aunt    Hypertension Mother    Breast cancer Maternal Aunt    Breast cancer Maternal Aunt     Social History   Socioeconomic History   Marital status: Married    Spouse name: Not on file   Number of children: Not on file   Years of education: Not on file   Highest education level: Not on file  Occupational History   Not on file  Social Needs   Financial resource strain: Not on file   Food insecurity    Worry: Not on file    Inability: Not on file   Transportation needs    Medical: Not on file    Non-medical: Not on file  Tobacco Use   Smoking status: Never Smoker   Smokeless tobacco: Never Used  Substance and Sexual Activity   Alcohol use: No     Alcohol/week: 0.0 standard drinks   Drug use: No   Sexual activity: Not on file  Lifestyle   Physical activity    Days per week: Not on file    Minutes per session: Not on file   Stress: Not on file  Relationships   Social connections    Talks on phone: Not on file    Gets together: Not on file    Attends religious service: Not on file    Active member of club or organization: Not on file    Attends meetings of clubs or organizations: Not on file    Relationship status: Not on file  Other Topics Concern   Not on file  Social History Narrative   Not on file     Review of Systems: A 12 point ROS discussed and pertinent positives are indicated in the HPI above.  All other systems are negative.  Review of Systems  Constitutional: Negative for chills and fever.  Respiratory: Positive for shortness of breath. Negative for wheezing.   Cardiovascular: Negative  for chest pain and palpitations.  Gastrointestinal: Negative for abdominal pain.  Neurological: Negative for headaches.  Psychiatric/Behavioral: Negative for behavioral problems and confusion.    Vital Signs: BP 129/87 (BP Location: Left Arm)    Pulse (!) 108    Temp 98.9 F (37.2 C) (Oral)    Resp 18    Ht 5\' 2"  (1.575 m)    Wt 173 lb (78.5 kg)    SpO2 97%    BMI 31.64 kg/m   Physical Exam Vitals signs and nursing note reviewed.  Constitutional:      General: She is not in acute distress.    Appearance: Normal appearance.  Cardiovascular:     Rate and Rhythm: Regular rhythm. Tachycardia present.     Heart sounds: Normal heart sounds. No murmur.  Pulmonary:     Effort: Pulmonary effort is normal. No respiratory distress.     Breath sounds: Normal breath sounds. No wheezing.  Skin:    General: Skin is warm and dry.  Neurological:     Mental Status: She is alert and oriented to person, place, and time.  Psychiatric:        Mood and Affect: Mood normal.        Behavior: Behavior normal.        Thought  Content: Thought content normal.        Judgment: Judgment normal.      MD Evaluation Airway: WNL Heart: WNL Abdomen: WNL Chest/ Lungs: WNL ASA  Classification: 3 Mallampati/Airway Score: Two   Imaging: Dg Chest 2 View  Result Date: 04/23/2019 CLINICAL DATA:  Shortness of breath, tachycardia and lethargy. History of metastatic breast cancer. EXAM: CHEST - 2 VIEW COMPARISON:  Chest x-ray 04/16/2019 FINDINGS: The power port is stable. Persistent right lower lobe process which appears to be a combination effusion and atelectasis based on prior chest CT. There is also persistent streaky left basilar atelectasis. IMPRESSION: 1. Stable/persistent right lower lobe process likely combination of effusion and atelectasis. 2. Stable streaky left basilar atelectasis. Electronically Signed   By: Marijo Sanes M.D.   On: 04/23/2019 11:27   Dg Chest 2 View  Result Date: 04/16/2019 CLINICAL DATA:  Right flank and chest pain as well as right shoulder pain. Shortness of breath and weakness. Recent diagnosis of right breast cancer. EXAM: CHEST - 2 VIEW COMPARISON:  04/13/2019 and 11/14/2018 FINDINGS: Right IJ Port-A-Cath unchanged. Persistent moderate size right pleural effusion likely with associated right basilar atelectasis. Left lung is clear. Mild stable cardiomegaly. Remainder of the exam is unchanged. IMPRESSION: Stable moderate size right pleural effusion likely with associated right basilar compressive atelectasis. Mild stable cardiomegaly. Electronically Signed   By: Marin Olp M.D.   On: 04/16/2019 16:39   Dg Chest 2 View  Result Date: 04/13/2019 CLINICAL DATA:  48 year old with RIGHT mastectomy on 03/04/2019 for breast cancer, currently being admitted due to a RIGHT pleural effusion. EXAM: CHEST - 2 VIEW 5:30 p.m.: COMPARISON:  Portable chest x-ray earlier same day at 12:43 a.m. and previous chest x-rays. CT chest 04/11/2019 and earlier. FINDINGS: AP SEMI-ERECT and LATERAL images were obtained.  Cardiac silhouette mildly enlarged even allowing for AP technique, unchanged. Large RIGHT pleural effusion with a subpulmonic component, unchanged since the examination earlier today, increased in size since yesterday. Associated passive atelectasis in the RIGHT LOWER LOBE. Mild linear atelectasis in the LEFT LOWER LOBE as noted on the recent CT. No new pulmonary parenchymal abnormalities. RIGHT jugular Port-A-Cath tip at or near the cavoatrial junction.  Post surgical changes related to RIGHT mastectomy and axillary node removal. IMPRESSION: 1. Large RIGHT pleural effusion with a subpulmonic component, increased in size since yesterday. Associated passive atelectasis in the RIGHT LOWER LOBE. 2. Mild linear atelectasis in the LEFT LOWER LOBE as noted on the recent CT. 3. No new abnormalities. Electronically Signed   By: Evangeline Dakin M.D.   On: 04/13/2019 18:11   Dg Chest 2 View  Result Date: 04/10/2019 CLINICAL DATA:  Shortness of breath. Right breast carcinoma. Gastroesophageal reflux disease. EXAM: CHEST - 2 VIEW COMPARISON:  12/06/2018 FINDINGS: Heart size is normal. Right-sided power port remains in place. New infiltrate or atelectasis is seen in both lung bases, right side greater than left. No evidence of pleural effusion. IMPRESSION: New bibasilar atelectasis versus infiltrates, right side greater than left. Electronically Signed   By: Marlaine Hind M.D.   On: 04/10/2019 08:11   Ct Angio Chest Pe W/cm &/or Wo Cm  Result Date: 04/23/2019 CLINICAL DATA:  Shortness of breath, history of metastatic breast cancer EXAM: CT ANGIOGRAPHY CHEST WITH CONTRAST TECHNIQUE: Multidetector CT imaging of the chest was performed using the standard protocol during bolus administration of intravenous contrast. Multiplanar CT image reconstructions and MIPs were obtained to evaluate the vascular anatomy. CONTRAST:  184mL OMNIPAQUE IOHEXOL 350 MG/ML SOLN COMPARISON:  04/11/2019 FINDINGS: Cardiovascular: Satisfactory  opacification of the pulmonary arteries to the segmental level. No evidence of pulmonary embolism. Normal heart size. No pericardial effusion. Right chest port catheter. Mediastinum/Nodes: No enlarged mediastinal, hilar, or axillary lymph nodes. Thyroid gland, trachea, and esophagus demonstrate no significant findings. Lungs/Pleura: Moderate to large right pleural effusion and associated atelectasis or consolidation, slightly increased compared to prior examination. There are multiple small bilateral pulmonary nodules, which are new or enlarged compared to prior examination, for example 5 mm nodule of the left upper lobe (series 10, image 37). Upper Abdomen: No acute abnormality. Musculoskeletal: Status post right mastectomy. There is a redemonstrated destructive soft tissue lesion of the anterior right chest wall and pectoral musculature, which is difficult to distinguish from adjacent pleural fluid and mostly appreciated by the destructive effect on the right anterior fourth rib. There has been interval enlargement of adjacent soft tissue nodularity of the chest wall (e.g. Series 5, image 166) Review of the MIP images confirms the above findings. IMPRESSION: 1.  Negative examination for pulmonary embolism. 2. Moderate to large right pleural effusion and associated atelectasis or consolidation, slightly increased compared to prior examination. 3. There are multiple small bilateral pulmonary nodules, which are new or enlarged compared to prior examination, for example 5 mm nodule of the left upper lobe (series 10, image 37). Findings are most consistent with pulmonary metastatic disease, although atypical infection is not excluded given very rapid interval development in comparison to CT dated 04/11/2019. 4. Status post right mastectomy. There is a redemonstrated destructive soft tissue lesion of the anterior right chest wall and pectoral musculature, which is difficult to distinguish from adjacent pleural fluid and  mostly appreciated by the destructive effect on the right anterior fourth rib. There has been interval enlargement of adjacent soft tissue nodularity of the chest wall (e.g. Series 5, image 166) Electronically Signed   By: Eddie Candle M.D.   On: 04/23/2019 15:22   Ct Angio Chest Pe W And/or Wo Contrast  Result Date: 04/11/2019 CLINICAL DATA:  48 year old female with history of right breast cancer and recent mastectomy presenting with shortness of breath. EXAM: CT ANGIOGRAPHY CHEST WITH CONTRAST TECHNIQUE: Multidetector CT  imaging of the chest was performed using the standard protocol during bolus administration of intravenous contrast. Multiplanar CT image reconstructions and MIPs were obtained to evaluate the vascular anatomy. CONTRAST:  136mL OMNIPAQUE IOHEXOL 350 MG/ML SOLN COMPARISON:  Chest CT dated 03/26/2019 and radiograph dated 04/09/2019 and CT dated 11/15/2018 FINDINGS: Cardiovascular: Borderline cardiomegaly. No pericardial effusion. The thoracic aorta is unremarkable. The origins of the great vessels of the aortic arch are patent as visualized. Evaluation of the pulmonary arteries is limited due to respiratory motion artifact and suboptimal visualization of the peripheral branches. No large or central pulmonary artery embolus identified. Mediastinum/Nodes: There is a 6.7 x 5.6 cm soft tissue appearing mass in the right cardiophrenic angle with infiltration of the anterior chest wall and extension into the subpectoral region. This is new compared to the CT of 11/15/2018 and has increased in size since the study of 03/26/2019 where it measured approximately 4.0 x 3.7 cm by my measurements. This may represent a loculated malignant effusion versus a soft tissue metastasis. Although, the lobulated border and infiltration into the anterior chest wall suggest metastasis, significant interval increase in size over the course of 2 weeks is somewhat unusual. This can be better evaluated with ultrasound or  MRI. No definite mediastinal or hilar adenopathy identified. The esophagus is grossly unremarkable. Right pectoral Port-A-Cath with tip in the region of the right atrium noted. Lungs/Pleura: There is a moderate right pleural effusion with associated partial compressive atelectasis of the right lower lobe as well as areas of atelectatic changes in the right upper lobe. There has been interval increase in the size of the pleural effusion and associated atelectasis since the prior CT. Linear left lung base atelectasis/scarring as well as a small left pleural effusion noted. There is no pneumothorax. The central airways are patent. Upper Abdomen: Ill-defined partially exophytic 3.2 x 2.7 cm lesion from the posterior right lobe of the liver as seen previously. There is a 4 mm right renal upper pole calculus. Musculoskeletal: Surgical clips in the right axilla and right mastectomy. Interval resolution of the right axillary fluid/seroma. Biopsy clip in the left breast. No acute osseous pathology. Review of the MIP images confirms the above findings. IMPRESSION: 1. No CT evidence of central pulmonary artery embolus. 2. Interval increase in the size of the right pleural effusion with associated partial compressive atelectasis of the right lower lobe. 3. Interval increase in the size of the right cardiophrenic angle soft tissue mass or loculated collection with infiltration of the anterior chest wall. This may represent a loculated malignant effusion versus a soft tissue metastasis. This can be better evaluated with ultrasound or MRI. 4. Ill-defined partially exophytic lesion from the posterior right lobe of the liver as seen on the prior CT. 5. Interval resolution of the right axillary fluid/seroma. Electronically Signed   By: Anner Crete M.D.   On: 04/11/2019 22:08   Ct Angio Chest Pe W Or Wo Contrast  Result Date: 03/26/2019 CLINICAL DATA:  Recent mastectomy for breast carcinoma. Shortness of breath. EXAM: CT  ANGIOGRAPHY CHEST WITH CONTRAST TECHNIQUE: Multidetector CT imaging of the chest was performed using the standard protocol during bolus administration of intravenous contrast. Multiplanar CT image reconstructions and MIPs were obtained to evaluate the vascular anatomy. CONTRAST:  14mL ISOVUE-370 IOPAMIDOL (ISOVUE-370) INJECTION 76% COMPARISON:  Chest CT November 15, 2018 FINDINGS: Cardiovascular: There is no demonstrable pulmonary embolus. There is no thoracic aortic aneurysm or dissection. The visualized great vessels appear unremarkable. Port-A-Cath tip is in the  superior vena cava. There is no pericardial effusion or pericardial thickening evident. Mediastinum/Nodes: Thyroid appears unremarkable. There is no appreciable thoracic adenopathy. No esophageal lesions are evident. Lungs/Pleura: There is a fairly small pleural effusion on the right with a rather minimal pleural effusion on the left. There is consolidation with atelectasis in the posterior segments of each lower lobe. There is also mild consolidation in the inferior lingula. No pneumothorax. No pulmonary nodular lesions evident. Upper Abdomen: There is a mass arising eccentrically from the liver measuring 4.1 x 2.4 cm, an apparent metastatic lesion. Visualized upper abdominal structures otherwise appear unremarkable. Musculoskeletal: No blastic or lytic bone lesions are evident. There is postoperative change in the right breast at the site of recent mastectomy. There is a fluid collection within the right chest region anterior to the muscles in the area of mastectomy measuring 2.4 x 2.2 cm. This structure represents either a seroma or liquefying hematoma. Review of the MIP images confirms the above findings. IMPRESSION: 1. No demonstrable pulmonary embolus. No thoracic aortic aneurysm or dissection. 2. Small pleural effusion on the right with rather minimal pleural effusion on the left. Consolidation noted in both posterior lung bases as well as in the  inferior lingula. Suspect atelectasis with likely superimposed pneumonia in these areas. 3.  No thoracic adenopathy. 4. Mass arising eccentrically from the right lobe of the liver posteriorly, felt to represent a metastatic focus, also present on most recent CT. 5. Seroma versus liquefying hematoma and postoperative region right breast measuring 2.4 x 2.2 cm. Electronically Signed   By: Lowella Grip III M.D.   On: 03/26/2019 13:57   Dg Chest Port 1 View  Result Date: 04/13/2019 CLINICAL DATA:  48 year old female with decreased breath sounds. EXAM: PORTABLE CHEST 1 VIEW COMPARISON:  Chest radiograph dated 04/12/2019 FINDINGS: Right-sided Port-A-Cath in similar position. There is shallow inspiration with bibasilar atelectasis. Infiltrate is not excluded. Overall no significant interval change since the earlier radiograph. Probable small bilateral pleural effusions there is no pneumothorax. Stable cardiac silhouette. No acute osseous pathology. Right axillary surgical clips. IMPRESSION: Shallow inspiration with small bilateral pleural effusions and bibasilar atelectasis. Overall similar or slightly worsened since the prior radiograph. Electronically Signed   By: Anner Crete M.D.   On: 04/13/2019 01:13   Dg Chest Port 1 View  Result Date: 04/12/2019 CLINICAL DATA:  Post thoracentesis EXAM: PORTABLE CHEST 1 VIEW COMPARISON:  04/09/2019 FINDINGS: Slight interval reduction in volume of a right-sided pleural effusion. No significant pneumothorax. Otherwise unchanged examination with cardiomegaly and bandlike scarring or atelectasis of the left lung base. IMPRESSION: Slight interval reduction in volume of a right-sided pleural effusion. No significant pneumothorax. Otherwise unchanged examination with cardiomegaly and bandlike scarring or atelectasis of the left lung base. Electronically Signed   By: Eddie Candle M.D.   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 She presents for thoracentesis. EXAM: ULTRASOUND GUIDED RIGHT THORACENTESIS MEDICATIONS: None. COMPLICATIONS: None immediate. PROCEDURE: An ultrasound guided thoracentesis was thoroughly discussed with the patient and questions answered. The benefits, risks, alternatives and complications were also discussed. The patient understands and wishes to proceed with the procedure. Written consent was obtained. Ultrasound was performed to localize and mark an adequate pocket of fluid in the right chest. The area was then prepped and draped in the normal sterile fashion. 1% Lidocaine was used for local anesthesia.  Under ultrasound guidance a 6 Fr Safe-T-Centesis catheter was introduced. Thoracentesis was performed. The catheter was removed and a dressing applied. FINDINGS: A total of approximately 500 mL of bloody pleural fluid was removed. Samples were sent to the laboratory as requested by the clinical team. IMPRESSION: Successful ultrasound guided right thoracentesis yielding 500 mL of pleural fluid. Electronically Signed   By: Jacqulynn Cadet M.D.   On: 04/12/2019 13:09    Labs:  CBC: Recent Labs    04/16/19 0408 04/23/19 0834 04/23/19 1309 04/24/19 0453  WBC 5.7 9.7 10.1 7.5  HGB 9.0* 11.3* 10.8* 9.4*  HCT 30.8* 37.8 35.6* 32.4*  PLT 360 383 364 319    COAGS: Recent Labs    11/25/18 1130 04/13/19 2016  INR 0.9 1.1  APTT  --  36    BMP: Recent Labs    04/16/19 0408 04/23/19 0834 04/23/19 1309 04/24/19 0453  NA 139 142 139 140  K 3.8 3.8 3.5 3.9  CL 105 102 101 104  CO2 26 28 27 27   GLUCOSE 115* 155* 126* 107*  BUN 10 9 8 10   CALCIUM 9.1 10.9* 9.9 9.4  CREATININE 0.64 0.93 0.62 0.68  GFRNONAA >60 >60 >60 >60  GFRAA >60 >60 >60 >60    LIVER FUNCTION TESTS: Recent Labs    04/16/19 0408 04/23/19 0834 04/23/19 1309 04/24/19 0453  BILITOT 0.4  0.3 0.3 0.4  AST 12* 9* 12* 10*  ALT 25 39 36 28  ALKPHOS 95 128* 113 95  PROT 6.5 7.6 7.2 6.4*  ALBUMIN 3.1* 3.6 3.6 3.1*     Assessment and Plan:  Right chest wall lesion. Plan for image-guided right chest wall lesion biopsy today with Dr. Vernard Gambles. Patient is NPO. Afebrile and WBCs WNL. She does not take blood thinners. INR 1.1 04/13/2019- ok to proceed per Dr. Vernard Gambles.  Risks and benefits discussed with the patient including, but not limited to bleeding, infection, damage to adjacent structures or low yield requiring additional tests. All of the patient's questions were answered, patient is agreeable to proceed. Consent signed and in chart.   Thank you for this interesting consult.  I greatly enjoyed meeting [name] Mae Dileonardo and look forward to participating in their care.  A copy of this report was sent to the requesting provider on this date.  Electronically Signed: Earley Abide, PA-C 04/24/2019, 10:45 AM   I spent a total of 40 Minutes in face to face in clinical consultation, greater than 50% of which was counseling/coordinating care for right chest wall lesion/biopsy.

## 2019-04-24 NOTE — Progress Notes (Signed)
Pharmacy Antibiotic Note  Brittany Rodgers is a 48 y.o. female with PMH of metastatic breast cancer s/p mastectomy admitted on 04/23/2019 with SOB, lethargy found to have large pleural effusion. Patient underwent thoracentesis and CT guided core biopsy R chest wall mass today. Patient febrile with Tmax 100.26F. Pharmacy has been consulted for Vancomycin and Cefepime dosing. Antibiotics to start after cultures drawn.   Plan: Vancomycin 1750mg  IV x 1, then 750mg  IV q12h Vancomycin levels at steady state, as indicated Cefepime 2g IV q8h Monitor renal function, cultures, clinical course  Height: 5\' 2"  (157.5 cm) Weight: 173 lb (78.5 kg) IBW/kg (Calculated) : 50.1  Temp (24hrs), Avg:99.8 F (37.7 C), Min:98.1 F (36.7 C), Max:100.9 F (38.3 C)  Recent Labs  Lab 04/23/19 0834 04/23/19 1309 04/24/19 0453  WBC 9.7 10.1 7.5  CREATININE 0.93 0.62 0.68    Estimated Creatinine Clearance: 82.6 mL/min (by C-G formula based on SCr of 0.68 mg/dL).    Allergies  Allergen Reactions  . Barbiturates Other (See Comments)    Keeps pt awake  . Methocarbamol Itching  . Sulfonamide Derivatives Itching    Antimicrobials this admission: 7/30 Vancomycin >> 7/30 Cefepime >>  Dose adjustments this admission: --  Microbiology results: 7/29 COVID: neg 7/30 pleural fluid cx: sent 7/30 AFB: sent 7/30 acid fast cx: sent 7/30 BCx: ordered 7/30 UCx: ordered    Thank you for allowing pharmacy to be a part of this patient's care.   Lindell Spar, PharmD, BCPS Clinical Pharmacist  04/24/2019 1:37 PM

## 2019-04-24 NOTE — Progress Notes (Addendum)
PROGRESS NOTE    [name]  WGN:[id number] DOB: 1971-02-13 DOA: 04/23/2019 PCP: [name], MD   Brief Narrative:  Brittany Rodgers is Brittany Rodgers 48 y.o. female with medical history significant of metastatic breast cancer s/p mastectomy presents to ED with complaints of increased sob and lethargy. Of note, pt is tired appearing and consequently [name] poor historian at this time. Per chart review, pt was seen recently in Oncology clinic on 7/28, where pt was noted to have new chest wall mass as well as liver mass. Pt was being considered for possible bipopsy. On day of admit, pt noted to be increasingly sob with tachycardia and was subseqently referred to ED from Oncology clinic.  ED Course: In the ED, pt was noted to have HR in excess of 140's, ultimately improved to the 110's with one dose of IV metoprolol. Pt underwent CT chest with findings worrisome for large R sided effusion. Pt remained on minimal O2 support. IR was consulted by EDP and hospitalist consulted for consideration for admission.  Assessment & Plan:   Principal Problem:   Pleural effusion Active Problems:   IBS   Hyperlipidemia   Chronic right hip pain   Malignant neoplasm of lower-outer quadrant of right breast of female, estrogen receptor negative (HCC)   S/P mastectomy   Pleural effusion on right  # SIRS: fever, tachycardia, concerning for infection/sepsis.  Will bolus and start on vanc/cefepime.  She's currently on RA without tachypnea and has Constantin Hillery normal WBC count. - Could be related to pleural effusion? - Follow blood, urine, and pleural fluid cultures - continue to monitor  Addendum: on way to bathroom, pt became LH and eyes rolled back.  Presyncope.  Suspect 2/2 above.  Continue with bolus, follow ekg.  Will transfer down to stepdown for closer monitoring.  Will update husband.  1. Large pleural effusion 1. Currently on minimal O2 support 2. CT with moderate to large R pleural effusion with associated  atelectasis or consolidation 3. Now s/p thoracentesis 4. Follow culture, gram stain without organism.  Follow cytology.  Acid fast smear and culture pending.  Exudative by lights.    2. Hx breast cancer, s/p mastectomy 1. Followed by [name] 2. New chest wall mass and liver lesion noted recently 3. Pt also has mutliple small bilateral pulm nodules, new or enlarged from prior exam concerning for metastatic disease (vs infection) 4. Appreciate [name] assistance, need pathologic confirmation of stage IV disease, awaiting cytology from thoracentesis and pt now s/p bx of chest wall mass 5. Appreciate palliative care for assistance with [place] conversations  3. HLD 1. Seems stable. 2. Cont statin per home regimen  4. Sinus tach 1. Hr in the 140's 2. In setting of above, follow response to bolus, though HR was elevated at presentation as well 3. Continue metoprolol  5. Hx IBS 1. Seems stable at this time  6. Chronic pain 1. Will continue home pain regimen   DVT prophylaxis: lovenox Code Status: full  Family Communication: discussed with husband Disposition Plan: pending further w/u and imrpovement   Consultants:   IR  Oncology  Palliative care  Procedures:  CT guided core bx R anterior chest wall mass 7/30 US thoracentesis 7/30  Antimicrobials:  Anti-infectives (From admission, onward)   Start     Dose/Rate Route Frequency Ordered Stop   04/25/19 0400  vancomycin (VANCOCIN) IVPB 750 mg/150 ml premix     750 mg 150 mL/hr over 60 Minutes Intravenous Every 12 hours  04/24/19 1332     04/24/19 2200  ceFEPIme (MAXIPIME) 2 g in sodium chloride 0.9 % 100 mL IVPB     2 g 200 mL/hr over 30 Minutes Intravenous Every 8 hours 04/24/19 1309     04/24/19 1315  ceFEPIme (MAXIPIME) 2 g in sodium chloride 0.9 % 100 mL IVPB     2 g 200 mL/hr over 30 Minutes Intravenous STAT 04/24/19 1307 04/24/19 1449   04/24/19 1315  vancomycin (VANCOCIN) 1,750 mg in sodium chloride 0.9 % 500  mL IVPB     1,750 mg 250 mL/hr over 120 Minutes Intravenous STAT 04/24/19 1307 04/25/19 1315     Subjective: Somewhat drowsy after procedure Doing ok now  Objective: Vitals:   04/24/19 1252 04/24/19 1346 04/24/19 1449 04/24/19 1454  BP: (!) 136/92 134/77 137/88   Pulse: (!) 130 (!) 122 (!) 132 (!) 132  Resp: 17 18 16    Temp: (!) 100.9 F (38.3 C) 99.3 F (37.4 C) (!) 101.2 F (38.4 C) 100.2 F (37.9 C)  TempSrc: Oral Oral Oral Oral  SpO2: 98% 97% (!) 87% 91%  Weight:      Height:        Intake/Output Summary (Last 24 hours) at 04/24/2019 1524 Last data filed at 04/24/2019 0849 Gross per 24 hour  Intake --  Output 450 ml  Net -450 ml   Filed Weights   04/24/19 0003  Weight: 78.5 kg    Examination:  General exam: Appears calm and comfortable  Respiratory system: quiet breath sounds Cardiovascular system: tachycardic Gastrointestinal system: Abdomen is nondistended, soft and nontender.  Central nervous system: Alert and oriented. No focal neurological deficits. Extremities: no LEE Skin: No rashes, lesions or ulcers Psychiatry: Judgement and insight appear normal. Mood & affect appropriate.     Data Reviewed: I have personally reviewed following labs and imaging studies  CBC: Recent Labs  Lab 04/23/19 0834 04/23/19 1309 04/24/19 0453  WBC 9.7 10.1 7.5  NEUTROABS 7.4 7.2  --   HGB 11.3* 10.8* 9.4*  HCT 37.8 35.6* 32.4*  MCV 86.3 87.5 88.8  PLT 383 364 673   Basic Metabolic Panel: Recent Labs  Lab 04/23/19 0834 04/23/19 1309 04/24/19 0453  NA 142 139 140  K 3.8 3.5 3.9  CL 102 101 104  CO2 28 27 27   GLUCOSE 155* 126* 107*  BUN 9 8 10   CREATININE 0.93 0.62 0.68  CALCIUM 10.9* 9.9 9.4   GFR: Estimated Creatinine Clearance: 82.6 mL/min (by C-G formula based on SCr of 0.68 mg/dL). Liver Function Tests: Recent Labs  Lab 04/23/19 0834 04/23/19 1309 04/24/19 0453  AST 9* 12* 10*  ALT 39 36 28  ALKPHOS 128* 113 95  BILITOT 0.3 0.3 0.4  PROT  7.6 7.2 6.4*  ALBUMIN 3.6 3.6 3.1*   No results for input(s): LIPASE, AMYLASE in the last 168 hours. No results for input(s): AMMONIA in the last 168 hours. Coagulation Profile: No results for input(s): INR, PROTIME in the last 168 hours. Cardiac Enzymes: No results for input(s): CKTOTAL, CKMB, CKMBINDEX, TROPONINI in the last 168 hours. BNP (last 3 results) No results for input(s): PROBNP in the last 8760 hours. HbA1C: No results for input(s): HGBA1C in the last 72 hours. CBG: No results for input(s): GLUCAP in the last 168 hours. Lipid Profile: No results for input(s): CHOL, HDL, LDLCALC, TRIG, CHOLHDL, LDLDIRECT in the last 72 hours. Thyroid Function Tests: Recent Labs    04/23/19 1306  TSH 1.334   Anemia Panel:  No results for input(s): VITAMINB12, FOLATE, FERRITIN, TIBC, IRON, RETICCTPCT in the last 72 hours. Sepsis Labs: No results for input(s): PROCALCITON, LATICACIDVEN in the last 168 hours.  Recent Results (from the past 240 hour(s))  SARS Coronavirus 2 (CEPHEID - Performed in [place] hospital lab), Hosp Order     Status: None   Collection Time: 04/23/19  1:06 PM   Specimen: Nasopharyngeal Swab  Result Value Ref Range Status   SARS Coronavirus 2 NEGATIVE NEGATIVE Final    Comment: (NOTE) If result is NEGATIVE SARS-CoV-2 target nucleic acids are NOT DETECTED. The SARS-CoV-2 RNA is generally detectable in upper and lower  respiratory specimens during the acute phase of infection. The lowest  concentration of SARS-CoV-2 viral copies this assay can detect is 250  copies / mL. Kevonte Vanecek negative result does not preclude SARS-CoV-2 infection  and should not be used as the sole basis for treatment or other  patient management decisions.  Elaina Cara negative result may occur with  improper specimen collection / handling, submission of specimen other  than nasopharyngeal swab, presence of viral mutation(s) within the  areas targeted by this assay, and inadequate number of viral copies    (<250 copies / mL). Jamael Hoffmann negative result must be combined with clinical  observations, patient history, and epidemiological information. If result is POSITIVE SARS-CoV-2 target nucleic acids are DETECTED. The SARS-CoV-2 RNA is generally detectable in upper and lower  respiratory specimens dur ing the acute phase of infection.  Positive  results are indicative of active infection with SARS-CoV-2.  Clinical  correlation with patient history and other diagnostic information is  necessary to determine patient infection status.  Positive results do  not rule out bacterial infection or co-infection with other viruses. If result is PRESUMPTIVE POSTIVE SARS-CoV-2 nucleic acids MAY BE PRESENT.   Deriyah Kunath presumptive positive result was obtained on the submitted specimen  and confirmed on repeat testing.  While 2019 novel coronavirus  (SARS-CoV-2) nucleic acids may be present in the submitted sample  additional confirmatory testing may be necessary for epidemiological  and / or clinical management purposes  to differentiate between  SARS-CoV-2 and other Sarbecovirus currently known to infect humans.  If clinically indicated additional testing with an alternate test  methodology [phone]) is advised. The SARS-CoV-2 RNA is generally  detectable in upper and lower respiratory sp ecimens during the acute  phase of infection. The expected result is Negative. Fact Sheet for Patients:  StrictlyIdeas.no Fact Sheet for Healthcare Providers: BankingDealers.co.za This test is not yet approved or cleared by the [place] FDA and has been authorized for detection and/or diagnosis of SARS-CoV-2 by FDA under an Emergency Use Authorization (EUA).  This EUA will remain in effect (meaning this test can be used) for the duration of the COVID-19 declaration under Section 564(b)(1) of the Act, 21 U.S.C. section 360bbb-3(b)(1), unless the authorization is terminated  or revoked sooner. Performed at [facility], [place] [address] [place], [place] [address]   Gram stain     Status: None   Collection Time: 04/24/19 10:37 AM   Specimen: Pleura  Result Value Ref Range Status   Specimen Description PLEURAL RIGHT  Final   Special Requests NONE  Final   Gram Stain   Final    FEW WBC PRESENT, PREDOMINANTLY MONONUCLEAR NO ORGANISMS SEEN Performed at [facility], [address] [place], [place] [address]    Report Status 04/24/2019 FINAL  Final  Culture, blood (routine x 2)  Status: None (Preliminary result)   Collection Time: 04/24/19  1:35 PM   Specimen: BLOOD LEFT HAND  Result Value Ref Range Status   Specimen Description   Final    BLOOD LEFT HAND Performed at [place] [address] [place], [place] [address]    Special Requests   Final    BOTTLES DRAWN AEROBIC ONLY Blood Culture adequate volume Performed at [facility], [place] [address] [place], [place] [address]    Culture PENDING  Incomplete   Report Status PENDING  Incomplete         Radiology Studies: Dg Chest 2 View  Result Date: 04/23/2019 CLINICAL DATA:  Shortness of breath, tachycardia and lethargy. History of metastatic breast cancer. EXAM: CHEST - 2 VIEW COMPARISON:  Chest x-ray 04/16/2019 FINDINGS: The power port is stable. Persistent right lower lobe process which appears to be Avonlea Sima combination effusion and atelectasis based on prior chest CT. There is also persistent streaky left basilar atelectasis. IMPRESSION: 1. Stable/persistent right lower lobe process likely combination of effusion and atelectasis. 2. Stable streaky left basilar atelectasis. Electronically Signed   By: Marijo Sanes M.D.   On: 04/23/2019 11:27   Ct Angio Chest Pe W/cm &/or Wo Cm  Result Date: 04/23/2019 CLINICAL DATA:  Shortness of breath, history of metastatic breast cancer EXAM: CT ANGIOGRAPHY CHEST WITH CONTRAST TECHNIQUE: Multidetector CT  imaging of the chest was performed using the standard protocol during bolus administration of intravenous contrast. Multiplanar CT image reconstructions and MIPs were obtained to evaluate the vascular anatomy. CONTRAST:  197mL OMNIPAQUE IOHEXOL 350 MG/ML SOLN COMPARISON:  04/11/2019 FINDINGS: Cardiovascular: Satisfactory opacification of the pulmonary arteries to the segmental level. No evidence of pulmonary embolism. Normal heart size. No pericardial effusion. Right chest port catheter. Mediastinum/Nodes: No enlarged mediastinal, hilar, or axillary lymph nodes. Thyroid gland, trachea, and esophagus demonstrate no significant findings. Lungs/Pleura: Moderate to large right pleural effusion and associated atelectasis or consolidation, slightly increased compared to prior examination. There are multiple small bilateral pulmonary nodules, which are new or enlarged compared to prior examination, for example 5 mm nodule of the left upper lobe (series 10, image 37). Upper Abdomen: No acute abnormality. Musculoskeletal: Status post right mastectomy. There is Timarie Labell redemonstrated destructive soft tissue lesion of the anterior right chest wall and pectoral musculature, which is difficult to distinguish from adjacent pleural fluid and mostly appreciated by the destructive effect on the right anterior fourth rib. There has been interval enlargement of adjacent soft tissue nodularity of the chest wall (e.g. Series 5, image 166) Review of the MIP images confirms the above findings. IMPRESSION: 1.  Negative examination for pulmonary embolism. 2. Moderate to large right pleural effusion and associated atelectasis or consolidation, slightly increased compared to prior examination. 3. There are multiple small bilateral pulmonary nodules, which are new or enlarged compared to prior examination, for example 5 mm nodule of the left upper lobe (series 10, image 37). Findings are most consistent with pulmonary metastatic disease, although  atypical infection is not excluded given very rapid interval development in comparison to CT dated 04/11/2019. 4. Status post right mastectomy. There is Aranza Geddes redemonstrated destructive soft tissue lesion of the anterior right chest wall and pectoral musculature, which is difficult to distinguish from adjacent pleural fluid and mostly appreciated by the destructive effect on the right anterior fourth rib. There has been interval enlargement of adjacent soft tissue nodularity of the chest wall (e.g. Series 5, image 166) Electronically Signed   By:  Eddie Candle M.D.   On: 04/23/2019 15:22   Ct Biopsy  Result Date: 04/24/2019 CLINICAL DATA:  History of breast carcinoma. Enlarging anterior right chest wall mass. EXAM: CT GUIDED CORE BIOPSY OF RIGHT ANTERIOR CHEST WALL MASS ANESTHESIA/SEDATION: Intravenous Fentanyl 95mcg and Versed 1mg  were administered as conscious sedation during continuous monitoring of the patient's level of consciousness and physiological / cardiorespiratory status by the radiology RN, with Keenen Roessner total moderate sedation time of 10 minutes. PROCEDURE: The procedure risks, benefits, and alternatives were explained to the patient. Questions regarding the procedure were encouraged and answered. The patient understands and consents to the procedure. Select axial scans through the lower chest were obtained. The right anterior chest wall mass was localized and an appropriate skin entry site was determined and marked. The operative field was prepped with chlorhexidinein Jamiee Milholland sterile fashion, and Treylen Gibbs sterile drape was applied covering the operative field. Laurita Peron sterile gown and sterile gloves were used for the procedure. Local anesthesia was provided with 1% Lidocaine. Under CT fluoroscopic guidance, Nilay Mangrum 17 gauge trocar needle was advanced to the margin of the lesion. Once needle tip position was confirmed, coaxial 18-gauge core biopsy samples were obtained, submitted in formalin to surgical pathology. The guide needle was  removed. Postprocedure scans show no hemorrhage or other apparent complication. The patient tolerated the procedure well. COMPLICATIONS: None immediate FINDINGS: Right anterior chest wall mass was localized. Representative core biopsy samples obtained as above. IMPRESSION: 1. Technically successful CT-guided core biopsy, right anterior chest wall mass. Electronically Signed   By: Lucrezia Europe M.D.   On: 04/24/2019 14:27   US Thoracentesis Asp Pleural Space W/img Guide  Result Date: 04/24/2019 INDICATION: Patient with history of right breast cancer, dyspnea, and recurrent right pleural effusion. Request is made for diagnostic and therapeutic right thoracentesis. EXAM: ULTRASOUND GUIDED DIAGNOSTIC AND THERAPEUTIC RIGHT THORACENTESIS MEDICATIONS: 10 mL 1% lidocaine 1 mg morphine IV COMPLICATIONS: None immediate. PROCEDURE: An ultrasound guided thoracentesis was thoroughly discussed with the patient and questions answered. The benefits, risks, alternatives and complications were also discussed. The patient understands and wishes to proceed with the procedure. Written consent was obtained. Ultrasound was performed to localize and mark an adequate pocket of fluid in the right chest. The area was then prepped and draped in the normal sterile fashion. 1% Lidocaine was used for local anesthesia. Under ultrasound guidance Josey Dettmann 6 Fr Safe-T-Centesis catheter was introduced. Thoracentesis was performed. The catheter was removed and Jaymond Waage dressing applied. FINDINGS: Queen Abbett total of approximately 650 mL of dark red fluid was removed. Samples were sent to the laboratory as requested by the clinical team. IMPRESSION: Successful ultrasound guided right thoracentesis yielding 650 mL of pleural fluid. Read by: Earley Abide, PA-C No pneumothorax on follow-up CT. Electronically Signed   By: Lucrezia Europe M.D.   On: 04/24/2019 12:52        Scheduled Meds:  atorvastatin  20 mg Oral Daily   docusate sodium  200 mg Oral BID   [START ON  04/25/2019] enoxaparin (LOVENOX) injection  40 mg Subcutaneous Q24H   lidocaine       metoprolol tartrate  12.5 mg Oral BID   morphine  15 mg Oral Q12H   polyethylene glycol  17 g Oral Daily   Continuous Infusions:  ceFEPime (MAXIPIME) IV     lactated ringers     lactated ringers     vancomycin     [START ON 04/25/2019] vancomycin       LOS: 1 day  Time spent: over 30 min    Fayrene Helper, MD [facility] Pager AMION  If 7PM-7AM, please contact night-coverage [email or web address] Password [email or web address] 04/24/2019, 3:24 PM

## 2019-04-24 NOTE — Progress Notes (Signed)
MEDICATION-RELATED CONSULT NOTE   IR Procedure Consult - Anticoagulant/Antiplatelet PTA/Inpatient Med List Review by Pharmacist    Procedure: Thoracentesis, CT guided core biopsy R chest wall mass     Completed: 04/24/2019 at 11:30am   Post-Procedural bleeding risk per IR MD assessment:  standard  Antithrombotic medications on inpatient or PTA profile prior to procedure:  Enoxaparin 40mg  SQ q24h (last dose 04/23/2019 at 2304)    Recommended restart time per IR Post-Procedure Guidelines: tomorrow morning       Plan:     Discussed with Dr. Gerilyn Nestle Enoxaparin 40mg  SQ q24h on 04/25/2019 at [place], PharmD, BCPS Clinical Pharmacist  04/24/2019 1:17 PM

## 2019-04-24 NOTE — Procedures (Signed)
PROCEDURE SUMMARY:  Successful image-guided right thoracentesis. Yielded 650 milliliters of dark red fluid. Patient tolerated procedure well. No immediate complications. EBL = 0 mL.  Specimen was sent for labs. CT chest ordered.  Claris Pong Louk PA-C 04/24/2019 10:44 AM

## 2019-04-24 NOTE — Discharge Instructions (Addendum)
Indwelling Pleural Catheter Home Guide   An indwelling pleural catheter is a thin, flexible tube that is inserted under your skin and into your chest. The catheter drains excess fluid that collects in the area between the chest wall and the lungs (pleural space).After the catheter is inserted, it can be attached to a bottle that collects fluid. The pleural catheter will allow you to drain fluid from your chest at home on a regular basis (sometimes daily). This will eliminate the need for frequent visits to the hospital or clinic to drain the fluid. The catheter may be removed after the excess fluid problem is resolved, usually after 2-3 months. It is important to follow instructions from your health care provider about how to drain and care for your catheter. What are the risks? Generally, this is a safe procedure. However, problems may occur, including:  Infection.  Skin damage around the catheter.  Lung damage.  Failure of the chest tube to work properly.  Spreading of cancer cells along the catheter, if you have cancer. Supplies needed:  Vacuum-sealed drainage bottle with attached drainage line.  Sterile dressing.  Sterile alcohol pads.  Sterile gloves.  Valve cap.  Sterile gauze pads, 4  4 inch (10 cm  10 cm).  Tape.  Adhesive dressing.  Sterile foam catheter pad. How to care for your catheter and insertion site  Wash your hands with soap and warm water before and after touching the catheter or insertion site. If soap and water are not available, use hand sanitizer.  Check your bandage (dressing) daily to make sure it is clean and dry.  Keep the skin around the catheter clean and dry.  Check the catheter regularly for any cracks or kinks in the tubing.  Check your catheter insertion site every day for signs of infection. Check for: ? Skin breakdown. ? Redness, swelling, or pain. ? Fluid or blood. ? Warmth. ? Pus or a bad smell. How to drain your  catheter You may need to drain your catheter every day, or more or less often as told by your health care provider. Follow instructions from your health care provider about how to drain your catheter. You may also refer to instructions that come with the drainage system. To drain the catheter: 1. Wash your hands with soap and warm water. If soap and water are not available, use hand sanitizer. 2. Carefully remove the dressing from around the catheter. 3. Wash your hands again. 4. Put on the gloves provided. 5. Prepare the vacuum-sealed drainage bottle and drainage line. Close the drainage line of the vacuum-sealed drainage bottle by squeezing the pinch clamp or rolling the wheel of the roller clamp toward the bottle. The vacuum in the bottle will be lost if the line is not closed completely. 6. Remove the access tip cover from the drainage line. Do not touch the end. Set it on a sterile surface. 7. Remove the catheter valve cap and throw it away. 8. Use an alcohol pad to clean the end of the catheter. 9. Insert the access tip into the catheter valve. Make sure the valve and access tip are securely connected. Listen for a click to confirm that they are connected. 10. Insert the T plunger to break the vacuum seal on the drainage bottle. 11. Open the clamp on the drainage line. 12. Allow the catheter to drain. Keep the catheter and the drainage bottle below the level of your chest. There may be a one-way valve on the end of  the tubing that will allow liquid and air to flow out of the catheter without letting air inside. 13. Drain the amount of fluid as told by your health care provider. It usually takes 5-15 minutes. Do not drain more than 1000 mL of fluid. You may feel a little discomfort while you are draining. If the pain is severe, stop draining and contact your health care provider. 14. After you finish draining the catheter, remove the drainage bottle tubing from the catheter. 15. Use a clean  alcohol pad to wipe the catheter tip. 16. Place a clean cap on the end of the catheter. 17. Use an alcohol pad to clean the skin around the catheter. 18. Allow the skin to air-dry. 19. Put the catheter pad on your skin. Curl the catheter into loops and place it on the pad. Do not place the catheter on your skin. 20. Replace the dressing over the catheter. 21. Discard the drainage bottle as instructed by your health care provider. Do not reuse the drainage bottle. How to change your dressing Change your dressing at least once a week, or more often if needed to keep the dressing dry. Be sure to change the dressing whenever it becomes moist. Your health care provider will tell you how often to change your dressing. 1. Wash your hands with soap and warm water. If soap and water are not available, use hand sanitizer. 2. Gently remove the old dressing. Avoid using scissors to remove the dressing. Sharp objects may damage the catheter. 3. Wash the skin around the insertion site with mild, fragrance-free soap and warm water. Rinse well, then pat the area dry with a clean cloth. 4. Check the skin around the catheter for signs of infection. Check for: ? Skin breakdown. ? Redness, swelling, or pain. ? Fluid or blood. ? Warmth. ? Pus or a bad smell. 5. If your catheter was stitched (sutured) to your skin, look at the suture to make sure it is still anchored in your skin. 6. Do not apply creams, ointments, or alcohol to the area. Let your skin air-dry completely before you apply a new dressing. 7. Curl the catheter into loops and place it on the sterile catheter pad. Do not place the catheter on your skin. 8. If you do not have a pad, use a clean dressing. Slide the dressing under the disk that holds the drainage catheter in place. 9. Use gauze to cover the catheter and the catheter pad. The catheter should rest on the pad or dressing, not on your skin. 10. Tape the dressing to your skin. You may be  instructed to use an adhesive dressing covering instead of gauze and tape. 11. Wash your hands with soap and warm water. If soap and water are not available, use hand sanitizer. General recommendations  Always wash your hands with soap and warm water before and after caring for your catheter and drainage bottle. Use a mild, fragrance-free soap. If soap and water are not available, use hand sanitizer.  Always make sure there are no leaks in the catheter or drainage bottle.  Each time you drain the catheter, note the color and amount of fluid.  Do not touch the tip of the catheter or the drainage bottle tubing.  Do not reuse drainage bottles.  Do not take baths, swim, or use a hot tub until your health care provider approves. Ask your health care provider if you may take showers. You may only be allowed to take sponge baths.  Take deep breaths regularly, followed by a cough. Doing this can help to prevent lung infection. Contact a health care provider if:  You have any questions about caring for your catheter or drainage bottle.  You still have pain at the catheter insertion site more than 2 days after your procedure.  You have pain while draining your catheter.  Your catheter becomes bent, twisted, or cracked.  The connection between the catheter and the collection bottle becomes loose.  You have any of these around your catheter insertion site or coming from it: ? Skin breakdown. ? Redness, swelling, or pain. ? Fluid or blood. ? Warmth. ? Pus or a bad smell. Get help right away if:  You have a fever or chills.  You have chest pain.  You have dizziness or shortness of breath.  You have severe redness, swelling, or pain at your catheter insertion site.  The catheter comes out.  The catheter is blocked or clogged. Summary  An indwelling pleural catheter is a thin, flexible tube that is inserted under your skin and into your chest. The catheter drains excess fluid that  collects in the area between the chest wall and the lungs (pleural space).  It is important to follow instructions from your health care provider about how to drain and care for your catheter.  Do not touch the tip of the catheter or the drainage bottle tubing.  Always wash your hands with soap and water before and after caring for your catheter and drainage bottle. If soap and water are not available, use hand sanitizer. This information is not intended to replace advice given to you by your health care provider. Make sure you discuss any questions you have with your health care provider. Document Released: 01/04/2017 Document Revised: 01/03/2019 Document Reviewed: 01/04/2017

## 2019-04-24 NOTE — Progress Notes (Signed)
Pt arrived back on unit from IR. Vitals stable, pt sleeping. Will continue to monitor.

## 2019-04-24 NOTE — Progress Notes (Signed)
Report given to [place], 2W, for transfer.

## 2019-04-24 NOTE — Procedures (Signed)
  Procedure: CT guided core biopsy R chest wall mass   EBL:   minimal Complications:  none immediate  See full dictation in [facility].  Dillard Cannon MD Main # [phone] Pager  [phone]

## 2019-04-24 NOTE — Progress Notes (Signed)
CRITICAL VALUE ALERT  Critical Value:  Lactic Acid 2.1  Date & Time Notied:  04/24/19, 1724  Provider Notified: MD Florene Glen  Orders Received/Actions taken: Awaiting new orders

## 2019-04-24 NOTE — ED Notes (Signed)
ED TO INPATIENT HANDOFF REPORT  ED Nurse Name and Phone #: Fredonia Highland [phone]   S Name/Age/Gender Brittany Rodgers 48 y.o. female Room/Bed: WA04/WA04  Code Status   Code Status: Full Code  Home/SNF/Other Home Patient oriented to: self, place, time and situation Is this baseline? Yes   Triage Complete: Triage complete  Chief Complaint tachycardia  Triage Note Pt being sent by [facility].  C/o increasing SOB, tachycardia, and lethargy.  Hx of metastatic breast CA.  Receiving immunotherapy.  Recently, admitted for pleural effusion.   She came to [name] from our [facility] for a "routine check and labs and I was going to talk to my doctor; [name]". The L.P.N. accompanying her tells me that pt. Was found by them to have tachycardia (heart rate ~125), so she was brought to E.D. Pt. Arrives here in no distress. And is alert and oriented x 4 with clear speech. She tells me she feels well, and that she has had no sign(s) of current illness.   Allergies Allergies  Allergen Reactions  . Barbiturates Other (See Comments)    Keeps pt awake  . Methocarbamol Itching  . Sulfonamide Derivatives Itching    Level of Care/Admitting Diagnosis ED Disposition    ED Disposition Condition [facility] Area: [place] [[id number]]  Level of Care: Telemetry [5]  Admit to tele based on following criteria: Complex arrhythmia (Bradycardia/Tachycardia)  Covid Evaluation: Confirmed COVID Negative  Diagnosis: Pleural effusion [242230]  Admitting Physician: Donne Hazel [[id number]]  Attending Physician: CHIU, STEPHEN K [[id number]]  Estimated length of stay: 3 - 4 days  Certification:: I certify this patient will need inpatient services for at least 2 midnights  PT Class (Do Not Modify): Inpatient [101]  PT Acc Code (Do Not Modify): Private [1]       B Medical/Surgery History Past Medical History:  Diagnosis Date  . Cancer ([place])    BREAST CANCER RIGHT BREAST  .  Chronic cough   . Family history of breast cancer   . GERD (gastroesophageal reflux disease)   . Hyperlipidemia   . IBS (irritable bowel syndrome)    sees Dr. Silvano Rusk   . Routine gynecological examination    sees Dr. Freda Munro   Past Surgical History:  Procedure Laterality Date  . CESAREAN SECTION    . MASTECTOMY W/ SENTINEL NODE BIOPSY Right 03/04/2019   Procedure: RIGHT MASTECTOMY WITH RIGHT AXILLARY SENTINEL LYMPH NODE BIOPSY;  Surgeon: Rolm Bookbinder, MD;  Location: [facility];  Service: General;  Laterality: Right;  . PORTACATH PLACEMENT Right 11/14/2018   Procedure: INSERTION PORT-A-CATH WITH ULTRASOUND;  Surgeon: Rolm Bookbinder, MD;  Location: [facility];  Service: General;  Laterality: Right;  . ROOT CANAL       A IV Location/Drains/Wounds Patient Lines/Drains/Airways Status   Active Line/Drains/Airways    Name:   Placement date:   Placement time:   Site:   Days:   Implanted Port 11/14/18 Right Chest   11/14/18    1204    Chest   161   Incision (Closed) 11/14/18 Chest Right   11/14/18    1208     161   Incision (Closed) 11/14/18 Neck Right   11/14/18    1223     161   Incision (Closed) 03/04/19 Breast Right   03/04/19    1728     51          Intake/Output Last  24 hours No intake or output data in the 24 hours ending 04/24/19 0239  Labs/Imaging Results for orders placed or performed during the hospital encounter of 04/23/19 (from the past 48 hour(s))  TSH     Status: None   Collection Time: 04/23/19  1:06 PM  Result Value Ref Range   TSH 1.334 0.350 - 4.500 uIU/mL    Comment: Performed by a 3rd Generation assay with a functional sensitivity of <=0.01 uIU/mL. Performed at [facility], [place] [address] [place], [place] [address]   SARS Coronavirus 2 (CEPHEID - Performed in [facility] hospital lab), Hosp Order     Status: None   Collection Time: 04/23/19  1:06 PM   Specimen: Nasopharyngeal Swab  Result  Value Ref Range   SARS Coronavirus 2 NEGATIVE NEGATIVE    Comment: (NOTE) If result is NEGATIVE SARS-CoV-2 target nucleic acids are NOT DETECTED. The SARS-CoV-2 RNA is generally detectable in upper and lower  respiratory specimens during the acute phase of infection. The lowest  concentration of SARS-CoV-2 viral copies this assay can detect is 250  copies / mL. A negative result does not preclude SARS-CoV-2 infection  and should not be used as the sole basis for treatment or other  patient management decisions.  A negative result may occur with  improper specimen collection / handling, submission of specimen other  than nasopharyngeal swab, presence of viral mutation(s) within the  areas targeted by this assay, and inadequate number of viral copies  (<250 copies / mL). A negative result must be combined with clinical  observations, patient history, and epidemiological information. If result is POSITIVE SARS-CoV-2 target nucleic acids are DETECTED. The SARS-CoV-2 RNA is generally detectable in upper and lower  respiratory specimens dur ing the acute phase of infection.  Positive  results are indicative of active infection with SARS-CoV-2.  Clinical  correlation with patient history and other diagnostic information is  necessary to determine patient infection status.  Positive results do  not rule out bacterial infection or co-infection with other viruses. If result is PRESUMPTIVE POSTIVE SARS-CoV-2 nucleic acids MAY BE PRESENT.   A presumptive positive result was obtained on the submitted specimen  and confirmed on repeat testing.  While 2019 novel coronavirus  (SARS-CoV-2) nucleic acids may be present in the submitted sample  additional confirmatory testing may be necessary for epidemiological  and / or clinical management purposes  to differentiate between  SARS-CoV-2 and other Sarbecovirus currently known to infect humans.  If clinically indicated additional testing with an  alternate test  methodology [phone]) is advised. The SARS-CoV-2 RNA is generally  detectable in upper and lower respiratory sp ecimens during the acute  phase of infection. The expected result is Negative. Fact Sheet for Patients:  StrictlyIdeas.no Fact Sheet for Healthcare Providers: BankingDealers.co.za This test is not yet approved or cleared by the [place] FDA and has been authorized for detection and/or diagnosis of SARS-CoV-2 by FDA under an Emergency Use Authorization (EUA).  This EUA will remain in effect (meaning this test can be used) for the duration of the COVID-19 declaration under Section 564(b)(1) of the Act, 21 U.S.C. section 360bbb-3(b)(1), unless the authorization is terminated or revoked sooner. Performed at [facility], [place] [address], [place], [place] [address]   CBC with Differential     Status: Abnormal   Collection Time: 04/23/19  1:09 PM  Result Value Ref Range   WBC 10.1 4.0 - 10.5 K/uL   RBC 4.07 3.87 -  5.11 MIL/uL   Hemoglobin 10.8 (L) 12.0 - 15.0 g/dL   HCT 35.6 (L) 36.0 - 46.0 %   MCV 87.5 80.0 - 100.0 fL   MCH 26.5 26.0 - 34.0 pg   MCHC 30.3 30.0 - 36.0 g/dL   RDW 18.6 (H) 11.5 - 15.5 %   Platelets 364 150 - 400 K/uL   nRBC 0.0 0.0 - 0.2 %   Neutrophils Relative % 72 %   Neutro Abs 7.2 1.7 - 7.7 K/uL   Lymphocytes Relative 17 %   Lymphs Abs 1.7 0.7 - 4.0 K/uL   Monocytes Relative 10 %   Monocytes Absolute 1.0 0.1 - 1.0 K/uL   Eosinophils Relative 1 %   Eosinophils Absolute 0.1 0.0 - 0.5 K/uL   Basophils Relative 0 %   Basophils Absolute 0.0 0.0 - 0.1 K/uL   Immature Granulocytes 0 %   Abs Immature Granulocytes 0.04 0.00 - 0.07 K/uL    Comment: Performed at [facility], [place] [address] [place], [place] [address]  Comprehensive metabolic panel     Status: Abnormal   Collection Time: 04/23/19  1:09 PM  Result Value Ref Range   Sodium 139 135 - 145  mmol/L   Potassium 3.5 3.5 - 5.1 mmol/L   Chloride 101 98 - 111 mmol/L   CO2 27 22 - 32 mmol/L   Glucose, Bld 126 (H) 70 - 99 mg/dL   BUN 8 6 - 20 mg/dL   Creatinine, Ser 0.62 0.44 - 1.00 mg/dL   Calcium 9.9 8.9 - 10.3 mg/dL   Total Protein 7.2 6.5 - 8.1 g/dL   Albumin 3.6 3.5 - 5.0 g/dL   AST 12 (L) 15 - 41 U/L   ALT 36 0 - 44 U/L   Alkaline Phosphatase 113 38 - 126 U/L   Total Bilirubin 0.3 0.3 - 1.2 mg/dL   GFR calc non Af Amer >60 >60 mL/min   GFR calc Af Amer >60 >60 mL/min   Anion gap 11 5 - 15    Comment: Performed at [facility], [place] [address] [place], [place] [address]   Dg Chest 2 View  Result Date: 04/23/2019 CLINICAL DATA:  Shortness of breath, tachycardia and lethargy. History of metastatic breast cancer. EXAM: CHEST - 2 VIEW COMPARISON:  Chest x-ray 04/16/2019 FINDINGS: The power port is stable. Persistent right lower lobe process which appears to be a combination effusion and atelectasis based on prior chest CT. There is also persistent streaky left basilar atelectasis. IMPRESSION: 1. Stable/persistent right lower lobe process likely combination of effusion and atelectasis. 2. Stable streaky left basilar atelectasis. Electronically Signed   By: Marijo Sanes M.D.   On: 04/23/2019 11:27   Ct Angio Chest Pe W/cm &/or Wo Cm  Result Date: 04/23/2019 CLINICAL DATA:  Shortness of breath, history of metastatic breast cancer EXAM: CT ANGIOGRAPHY CHEST WITH CONTRAST TECHNIQUE: Multidetector CT imaging of the chest was performed using the standard protocol during bolus administration of intravenous contrast. Multiplanar CT image reconstructions and MIPs were obtained to evaluate the vascular anatomy. CONTRAST:  18mL OMNIPAQUE IOHEXOL 350 MG/ML SOLN COMPARISON:  04/11/2019 FINDINGS: Cardiovascular: Satisfactory opacification of the pulmonary arteries to the segmental level. No evidence of pulmonary embolism. Normal heart size. No pericardial effusion. Right chest  port catheter. Mediastinum/Nodes: No enlarged mediastinal, hilar, or axillary lymph nodes. Thyroid gland, trachea, and esophagus demonstrate no significant findings. Lungs/Pleura: Moderate to large right pleural effusion and associated atelectasis or consolidation, slightly increased compared  to prior examination. There are multiple small bilateral pulmonary nodules, which are new or enlarged compared to prior examination, for example 5 mm nodule of the left upper lobe (series 10, image 37). Upper Abdomen: No acute abnormality. Musculoskeletal: Status post right mastectomy. There is a redemonstrated destructive soft tissue lesion of the anterior right chest wall and pectoral musculature, which is difficult to distinguish from adjacent pleural fluid and mostly appreciated by the destructive effect on the right anterior fourth rib. There has been interval enlargement of adjacent soft tissue nodularity of the chest wall (e.g. Series 5, image 166) Review of the MIP images confirms the above findings. IMPRESSION: 1.  Negative examination for pulmonary embolism. 2. Moderate to large right pleural effusion and associated atelectasis or consolidation, slightly increased compared to prior examination. 3. There are multiple small bilateral pulmonary nodules, which are new or enlarged compared to prior examination, for example 5 mm nodule of the left upper lobe (series 10, image 37). Findings are most consistent with pulmonary metastatic disease, although atypical infection is not excluded given very rapid interval development in comparison to CT dated 04/11/2019. 4. Status post right mastectomy. There is a redemonstrated destructive soft tissue lesion of the anterior right chest wall and pectoral musculature, which is difficult to distinguish from adjacent pleural fluid and mostly appreciated by the destructive effect on the right anterior fourth rib. There has been interval enlargement of adjacent soft tissue nodularity of  the chest wall (e.g. Series 5, image 166) Electronically Signed   By: Eddie Candle M.D.   On: 04/23/2019 15:22    Pending Labs Unresulted Labs (From admission, onward)    Start     Ordered   04/24/19 0500  Comprehensive metabolic panel  Tomorrow morning,   R     04/23/19 1746   04/24/19 0500  CBC  Tomorrow morning,   R     04/23/19 1746   04/23/19 1201  Urinalysis, Routine w reflex microscopic  Once,   STAT     04/23/19 1200          Vitals/Pain Today's Vitals   04/23/19 2330 04/24/19 0003 04/24/19 0005 04/24/19 0100  BP: (!) 135/96 132/81  (!) 134/91  Pulse: (!) 123 (!) 121  (!) 103  Resp: (!) 24 19  (!) 25  Temp:  98.1 F (36.7 C)    TempSrc:  Oral    SpO2: 100% 100%  98%  Weight:  78.5 kg    Height:  5\' 2"  (1.575 m)    PainSc:  10-Worst pain ever 10-Worst pain ever     Isolation Precautions No active isolations  Medications Medications  sodium chloride (PF) 0.9 % injection (has no administration in time range)  acetaminophen (TYLENOL) tablet 650 mg (has no administration in time range)    Or  acetaminophen (TYLENOL) suppository 650 mg (has no administration in time range)  metoprolol tartrate (LOPRESSOR) tablet 12.5 mg (12.5 mg Oral Given 04/23/19 2303)  labetalol (NORMODYNE) injection 5 mg (has no administration in time range)  atorvastatin (LIPITOR) tablet 20 mg (20 mg Oral Given 04/23/19 2303)  oxyCODONE (Oxy IR/ROXICODONE) immediate release tablet 5 mg (5 mg Oral Given 04/24/19 0011)  polyethylene glycol (MIRALAX / GLYCOLAX) packet 17 g (has no administration in time range)  morphine 4 MG/ML injection 4 mg (4 mg Intravenous Given 04/23/19 1257)  ondansetron (ZOFRAN) injection 4 mg (4 mg Intravenous Given 04/23/19 1257)  iohexol (OMNIPAQUE) 350 MG/ML injection 100 mL (100 mLs Intravenous Contrast Given 04/23/19  1442)  HYDROmorphone (DILAUDID) injection 1 mg (1 mg Intravenous Given 04/23/19 1541)  metoprolol tartrate (LOPRESSOR) injection 5 mg (5 mg Intravenous Given  04/23/19 1651)    Mobility walks Low fall risk   Focused Assessments Pulmonary Assessment Handoff:  Lung sounds:   O2 Device: Room Air O2 Flow Rate (L/min): 1.5 L/min      R Recommendations: See Admitting Provider Note  Report given to:   Additional Notes:

## 2019-04-24 NOTE — Progress Notes (Addendum)
Bosie Helper Huckabay   DOB:02/13/1971   CN#:[id number]   OQH#:[id number]  Subjective:  [name] says her pain this AM is not bad, but she "hasn't moved yet" and that is when she hurts. No BM in 4 days per her account. She is mildly lethargic but answers appropriately. She is very worried that last thoracentesis "they didn't numb me right" and "it hurt a lot." Nursing in room  Objective: young African American woman examined in bed Vitals:   04/24/19 0145 04/24/19 0330  BP: (!) 128/92 (!) 141/90  Pulse: 98 (!) 108  Resp: 18 18  Temp:  98.9 F (37.2 C)  SpO2: 100% 97%    Body mass index is 31.64 kg/m. No intake or output data in the 24 hours ending 04/24/19 0732     CBG (last 3)  No results for input(s): GLUCAP in the last 72 hours.   Labs:  Lab Results  Component Value Date   WBC 7.5 04/24/2019   HGB 9.4 (L) 04/24/2019   HCT 32.4 (L) 04/24/2019   MCV 88.8 04/24/2019   PLT 319 04/24/2019   NEUTROABS 7.2 04/23/2019    @LASTCHEMISTRY @  Urine Studies No results for input(s): UHGB, CRYS in the last 72 hours.  Invalid input(s): UACOL, UAPR, USPG, UPH, UTP, UGL, UKET, UBIL, UNIT, UROB, ULEU, UEPI, UWBC, URBC, UBAC, CAST, [place], [place]  Basic Metabolic Panel: Recent Labs  Lab 04/23/19 0834 04/23/19 1309 04/24/19 0453  NA 142 139 140  K 3.8 3.5 3.9  CL 102 101 104  CO2 28 27 27   GLUCOSE 155* 126* 107*  BUN 9 8 10   CREATININE 0.93 0.62 0.68  CALCIUM 10.9* 9.9 9.4   GFR Estimated Creatinine Clearance: 82.6 mL/min (by C-G formula based on SCr of 0.68 mg/dL). Liver Function Tests: Recent Labs  Lab 04/23/19 0834 04/23/19 1309 04/24/19 0453  AST 9* 12* 10*  ALT 39 36 28  ALKPHOS 128* 113 95  BILITOT 0.3 0.3 0.4  PROT 7.6 7.2 6.4*  ALBUMIN 3.6 3.6 3.1*   No results for input(s): LIPASE, AMYLASE in the last 168 hours. No results for input(s): AMMONIA in the last 168 hours. Coagulation profile No results for input(s): INR, PROTIME in the last 168 hours.  CBC: Recent  Labs  Lab 04/23/19 0834 04/23/19 1309 04/24/19 0453  WBC 9.7 10.1 7.5  NEUTROABS 7.4 7.2  --   HGB 11.3* 10.8* 9.4*  HCT 37.8 35.6* 32.4*  MCV 86.3 87.5 88.8  PLT 383 364 319   Cardiac Enzymes: No results for input(s): CKTOTAL, CKMB, CKMBINDEX, TROPONINI in the last 168 hours. BNP: Invalid input(s): POCBNP CBG: No results for input(s): GLUCAP in the last 168 hours. D-Dimer No results for input(s): DDIMER in the last 72 hours. Hgb A1c No results for input(s): HGBA1C in the last 72 hours. Lipid Profile No results for input(s): CHOL, HDL, LDLCALC, TRIG, CHOLHDL, LDLDIRECT in the last 72 hours. Thyroid function studies Recent Labs    04/23/19 1306  TSH 1.334   Anemia work up No results for input(s): VITAMINB12, FOLATE, FERRITIN, TIBC, IRON, RETICCTPCT in the last 72 hours. Microbiology Recent Results (from the past 240 hour(s))  SARS Coronavirus 2 (CEPHEID - Performed in [place] hospital lab), Hosp Order     Status: None   Collection Time: 04/23/19  1:06 PM   Specimen: Nasopharyngeal Swab  Result Value Ref Range Status   SARS Coronavirus 2 NEGATIVE NEGATIVE Final    Comment: (NOTE) If result is NEGATIVE SARS-CoV-2 target nucleic acids  are NOT DETECTED. The SARS-CoV-2 RNA is generally detectable in upper and lower  respiratory specimens during the acute phase of infection. The lowest  concentration of SARS-CoV-2 viral copies this assay can detect is 250  copies / mL. A negative result does not preclude SARS-CoV-2 infection  and should not be used as the sole basis for treatment or other  patient management decisions.  A negative result may occur with  improper specimen collection / handling, submission of specimen other  than nasopharyngeal swab, presence of viral mutation(s) within the  areas targeted by this assay, and inadequate number of viral copies  (<250 copies / mL). A negative result must be combined with clinical  observations, patient history, and  epidemiological information. If result is POSITIVE SARS-CoV-2 target nucleic acids are DETECTED. The SARS-CoV-2 RNA is generally detectable in upper and lower  respiratory specimens dur ing the acute phase of infection.  Positive  results are indicative of active infection with SARS-CoV-2.  Clinical  correlation with patient history and other diagnostic information is  necessary to determine patient infection status.  Positive results do  not rule out bacterial infection or co-infection with other viruses. If result is PRESUMPTIVE POSTIVE SARS-CoV-2 nucleic acids MAY BE PRESENT.   A presumptive positive result was obtained on the submitted specimen  and confirmed on repeat testing.  While 2019 novel coronavirus  (SARS-CoV-2) nucleic acids may be present in the submitted sample  additional confirmatory testing may be necessary for epidemiological  and / or clinical management purposes  to differentiate between  SARS-CoV-2 and other Sarbecovirus currently known to infect humans.  If clinically indicated additional testing with an alternate test  methodology [phone]) is advised. The SARS-CoV-2 RNA is generally  detectable in upper and lower respiratory sp ecimens during the acute  phase of infection. The expected result is Negative. Fact Sheet for Patients:  StrictlyIdeas.no Fact Sheet for Healthcare Providers: BankingDealers.co.za This test is not yet approved or cleared by the [place] FDA and has been authorized for detection and/or diagnosis of SARS-CoV-2 by FDA under an Emergency Use Authorization (EUA).  This EUA will remain in effect (meaning this test can be used) for the duration of the COVID-19 declaration under Section 564(b)(1) of the Act, 21 U.S.C. section 360bbb-3(b)(1), unless the authorization is terminated or revoked sooner. Performed at [facility], [place] [address] [place], [place] [address]        Studies:  Dg Chest 2 View  Result Date: 04/23/2019 CLINICAL DATA:  Shortness of breath, tachycardia and lethargy. History of metastatic breast cancer. EXAM: CHEST - 2 VIEW COMPARISON:  Chest x-ray 04/16/2019 FINDINGS: The power port is stable. Persistent right lower lobe process which appears to be a combination effusion and atelectasis based on prior chest CT. There is also persistent streaky left basilar atelectasis. IMPRESSION: 1. Stable/persistent right lower lobe process likely combination of effusion and atelectasis. 2. Stable streaky left basilar atelectasis. Electronically Signed   By: Marijo Sanes M.D.   On: 04/23/2019 11:27   Ct Angio Chest Pe W/cm &/or Wo Cm  Result Date: 04/23/2019 CLINICAL DATA:  Shortness of breath, history of metastatic breast cancer EXAM: CT ANGIOGRAPHY CHEST WITH CONTRAST TECHNIQUE: Multidetector CT imaging of the chest was performed using the standard protocol during bolus administration of intravenous contrast. Multiplanar CT image reconstructions and MIPs were obtained to evaluate the vascular anatomy. CONTRAST:  183mL OMNIPAQUE IOHEXOL 350 MG/ML SOLN COMPARISON:  04/11/2019 FINDINGS: Cardiovascular: Satisfactory opacification of the pulmonary arteries  to the segmental level. No evidence of pulmonary embolism. Normal heart size. No pericardial effusion. Right chest port catheter. Mediastinum/Nodes: No enlarged mediastinal, hilar, or axillary lymph nodes. Thyroid gland, trachea, and esophagus demonstrate no significant findings. Lungs/Pleura: Moderate to large right pleural effusion and associated atelectasis or consolidation, slightly increased compared to prior examination. There are multiple small bilateral pulmonary nodules, which are new or enlarged compared to prior examination, for example 5 mm nodule of the left upper lobe (series 10, image 37). Upper Abdomen: No acute abnormality. Musculoskeletal: Status post right mastectomy. There is a redemonstrated  destructive soft tissue lesion of the anterior right chest wall and pectoral musculature, which is difficult to distinguish from adjacent pleural fluid and mostly appreciated by the destructive effect on the right anterior fourth rib. There has been interval enlargement of adjacent soft tissue nodularity of the chest wall (e.g. Series 5, image 166) Review of the MIP images confirms the above findings. IMPRESSION: 1.  Negative examination for pulmonary embolism. 2. Moderate to large right pleural effusion and associated atelectasis or consolidation, slightly increased compared to prior examination. 3. There are multiple small bilateral pulmonary nodules, which are new or enlarged compared to prior examination, for example 5 mm nodule of the left upper lobe (series 10, image 37). Findings are most consistent with pulmonary metastatic disease, although atypical infection is not excluded given very rapid interval development in comparison to CT dated 04/11/2019. 4. Status post right mastectomy. There is a redemonstrated destructive soft tissue lesion of the anterior right chest wall and pectoral musculature, which is difficult to distinguish from adjacent pleural fluid and mostly appreciated by the destructive effect on the right anterior fourth rib. There has been interval enlargement of adjacent soft tissue nodularity of the chest wall (e.g. Series 5, image 166) Electronically Signed   By: Eddie Candle M.D.   On: 04/23/2019 15:22    Assessment: 48 y.o. [place] Parkins, [place] woman status post right breast lower outer quadrant biopsy 10/29/2018 for a clinical T2 N0, stage IIB invasive ductal carcinoma, grade 3, triple negative, with an MIB-1 of 80%             (a) CT of the chest 11/15/2018 shows possible liver metastases, but liver biopsy 11/25/2018 consistent with hemangioma             (b) bone scan 11/15/2018 shows no suspicious bone lesions  (1) neoadjuvant chemotherapy consisting of doxorubicin and  cyclophosphamide in dose dense fashion x4, started 11/26/2018, completed 01/07/2019, followed by weekly paclitaxel and carboplatin x12 starting 01/28/2019  (2) patient desires ovarian function preservation:              (a) Zoladex started 11/08/2018, discontinued 02/04/2019             (b) patient is status post bilateral tubal ligation  (3) status post right mastectomy with sentinel lymph node sampling 03/04/2019 for a ypTac ypN1a invasive ductal carcinoma grade 3, repeat prognostic panel again triple negative             (a) a total of 6 axillary lymph nodes removed, 2 sentinel ones, 1 positive  (4) adjuvant radiation pending  (5) genetics counseling on 11/28/2018, testing was recommended, but declined by patient.  (6) Staging studies:             (a) chest CT scan and bone scan 11/15/2018 show a liver mass, no lung or bone lesions             (b)  liver biopsy 11/25/2018 shows a hemangioma             (c) chest CT Angio 03/26/2019 again shows liver mass, right breast "seroma"  (d) cytology from R thoracentesis 04/12/2019 nondiagnostic  (e) CT angio 04/23/2019 shows recurrent right effusion, multiple bilateral pulmonary nodules, invasive anterior right chest wall lesion  (7) pembrolizumab started 04/02/2019, repeated every 21 days    Plan:  Brittany Rodgers's situation is difficult. Her breast cancer is triple-negative, which means our only systemic treatment is chemotherapy. However her breast cancer grew while she was receiving our most active agents. She now has likely recurrence to chest wall and lungs, possibly liver. The chance of a meaningful (lasting) or even any response from additional chemo in her case is low.  We have started pembrolizumab in the hope her immune system may assist in control of her tumor. She was supposed to have received her 2d dose 07/29 but ended up admitted given her SOB, tachycardia and poorly controlled pain.  She now has multiple small bilateral lung  nodules which are very likely metastatic.  We need pathologic confirmation of stage IV disease and repeat cytology from thoracentesis and biopsy of the chest wall mass would give [name] that information. This is planned for today.  A more immediate issue is pain control. I favor morphine because it is inexpensive and used by Hospice. I will write for PRN IV with a dose on call to IR today (she complained about pain with the last thoracentesis). She can then be started on MSContin 15 BID and MSIR 15 mg Q4h PRN with tittration and bowel prophylaxis to follow. I will consult our palliative care team to assist. They can also initiate a goals of care discussion  I will see Brittany Rodgers again tomorrow AM and discuss further chemo vs Hospice referral, as well as address advanced directives.  Greatly appreciate your help to this patient!           Chauncey Cruel, MD 04/24/2019  7:32 AM Medical Oncology and Hematology [facility] [address] [place], [place] [address] Tel. [phone]    Fax. [phone]

## 2019-04-25 ENCOUNTER — Other Ambulatory Visit: Payer: Self-pay | Admitting: Oncology

## 2019-04-25 ENCOUNTER — Encounter: Payer: BC Managed Care – PPO | Admitting: Physical Therapy

## 2019-04-25 ENCOUNTER — Inpatient Hospital Stay (HOSPITAL_COMMUNITY): Payer: BC Managed Care – PPO

## 2019-04-25 ENCOUNTER — Ambulatory Visit: Payer: BC Managed Care – PPO | Admitting: Physical Therapy

## 2019-04-25 DIAGNOSIS — G893 Neoplasm related pain (acute) (chronic): Secondary | ICD-10-CM

## 2019-04-25 DIAGNOSIS — Z7189 Other specified counseling: Secondary | ICD-10-CM

## 2019-04-25 DIAGNOSIS — Z515 Encounter for palliative care: Secondary | ICD-10-CM

## 2019-04-25 LAB — COMPREHENSIVE METABOLIC PANEL
ALT: 24 U/L (ref 0–44)
AST: 11 U/L — ABNORMAL LOW (ref 15–41)
Albumin: 2.7 g/dL — ABNORMAL LOW (ref 3.5–5.0)
Alkaline Phosphatase: 83 U/L (ref 38–126)
Anion gap: 10 (ref 5–15)
BUN: 8 mg/dL (ref 6–20)
CO2: 26 mmol/L (ref 22–32)
Calcium: 8.7 mg/dL — ABNORMAL LOW (ref 8.9–10.3)
Chloride: 104 mmol/L (ref 98–111)
Creatinine, Ser: 0.65 mg/dL (ref 0.44–1.00)
GFR calc Af Amer: >60 mL/min (ref >60)
GFR calc non Af Amer: >60 mL/min (ref >60)
Glucose, Bld: 121 mg/dL — ABNORMAL HIGH (ref 70–99)
Potassium: 3.8 mmol/L (ref 3.5–5.1)
Sodium: 140 mmol/L (ref 135–145)
Total Bilirubin: 0.3 mg/dL (ref 0.3–1.2)
Total Protein: 5.7 g/dL — ABNORMAL LOW (ref 6.5–8.1)

## 2019-04-25 LAB — ACID FAST SMEAR (AFB, MYCOBACTERIA): Acid Fast Smear: NEGATIVE

## 2019-04-25 LAB — CBC
HCT: 30.1 % — ABNORMAL LOW (ref 36.0–46.0)
Hemoglobin: 9 g/dL — ABNORMAL LOW (ref 12.0–15.0)
MCH: 26.7 pg (ref 26.0–34.0)
MCHC: 29.9 g/dL — ABNORMAL LOW (ref 30.0–36.0)
MCV: 89.3 fL (ref 80.0–100.0)
Platelets: 298 K/uL (ref 150–400)
RBC: 3.37 MIL/uL — ABNORMAL LOW (ref 3.87–5.11)
RDW: 18.6 % — ABNORMAL HIGH (ref 11.5–15.5)
WBC: 6.8 K/uL (ref 4.0–10.5)
nRBC: 0 % (ref 0.0–0.2)

## 2019-04-25 LAB — URINE CULTURE

## 2019-04-25 LAB — PROCALCITONIN: Procalcitonin: <0.1 ng/mL

## 2019-04-25 LAB — MAGNESIUM: Magnesium: 1.6 mg/dL — ABNORMAL LOW (ref 1.7–2.4)

## 2019-04-25 MED ORDER — MAGNESIUM CITRATE PO SOLN
0.5000 | Freq: Once | ORAL | Status: AC
  Administered 2019-04-25: 09:00:00 0.5 via ORAL
  Filled 2019-04-25: qty 296

## 2019-04-25 MED ORDER — LACTATED RINGERS IV BOLUS
1000.0000 mL | Freq: Once | INTRAVENOUS | Status: AC
  Administered 2019-04-25: 1000 mL via INTRAVENOUS

## 2019-04-25 MED ORDER — SENNOSIDES-DOCUSATE SODIUM 8.6-50 MG PO TABS
2.0000 | ORAL_TABLET | Freq: Two times a day (BID) | ORAL | Status: DC
  Administered 2019-04-25 – 2019-05-09 (×20): 2 via ORAL
  Filled 2019-04-25 (×26): qty 2

## 2019-04-25 MED ORDER — IBUPROFEN 800 MG PO TABS
800.0000 mg | ORAL_TABLET | Freq: Three times a day (TID) | ORAL | Status: DC | PRN
  Administered 2019-04-25 – 2019-05-08 (×5): 800 mg via ORAL
  Filled 2019-04-25 (×5): qty 1

## 2019-04-25 MED ORDER — SODIUM CHLORIDE 0.9 % IV SOLN
INTRAVENOUS | Status: DC | PRN
  Administered 2019-04-25 – 2019-04-26 (×2): 250 mL via INTRAVENOUS
  Administered 2019-05-01: 1000 mL via INTRAVENOUS

## 2019-04-25 MED ORDER — IBUPROFEN 200 MG PO TABS: 400.0000 mg | ORAL_TABLET | Freq: Four times a day (QID) | ORAL | Status: DC | PRN

## 2019-04-25 NOTE — Consult Note (Addendum)
Consultation Note Date: 04/25/2019   Patient Name: Brittany Rodgers  DOB: [birth year]  MRN: [id number]  Age / Sex: 48 y.o., female  PCP: [name], MD Referring Physician: Elodia Florence., *  Reason for Consultation: Establishing goals of care, Non pain symptom management and Pain control  HPI/Patient Profile: 48 y.o. female  with past medical history of metastatic breast cancer status post mastectomy with new development of chest mass and liver mass now status post biopsy admitted on 04/23/2019 with Sirs, malignant pleural effusion, and high concern for recurrent triple negative breast cancer.  Plan is for her to follow-up with [name] on 8/6 to discuss biopsy results.  Palliative consulted for symptom management and initiation of goals of care discussion.  Clinical Assessment and Goals of Care: I saw and examined Brittany Rodgers today.    On arrival to her room, she was on the phone with her husband.  She was limited in how much conversation she would participate in today.    She reports her pain has been better controlled today but still no bowel movement.  She was pleasant and cooperative throughout encounter, but her affect remained flat and she was not really willing to engage in conversation about her disease or goals moving forward today.    She is agreeable to follow-up tomorrow.  SUMMARY OF RECOMMENDATIONS   - Addition of senna BID to bowel regimen. ? Enema tomorrow if no result from mag citrate. - Continue same for pain - She does not really engage in conversation relating to her disease or goals of care.  Will attempt again tomorrow.  Code Status/Advance Care Planning:  Full code   Symptom Management:   As above  Palliative Prophylaxis:   Frequent Pain Assessment  Additional Recommendations (Limitations, Scope, Preferences):  Full Scope Treatment   Psycho-social/Spiritual:   Desire for further Chaplaincy support:Did not address today  Additional Recommendations: Caregiving  Support/Resources  Prognosis:   Unable to determine  Discharge Planning: To Be Determined      Primary Diagnoses: Present on Admission: . Pleural effusion . Pleural effusion on right . IBS . Chronic right hip pain . Hyperlipidemia   I have reviewed the medical record, interviewed the patient and family, and examined the patient. The following aspects are pertinent.  Past Medical History:  Diagnosis Date  . Cancer ([place])    BREAST CANCER RIGHT BREAST  . Chronic cough   . Family history of breast cancer   . GERD (gastroesophageal reflux disease)   . Hyperlipidemia   . IBS (irritable bowel syndrome)    sees Dr. Silvano Rusk   . Routine gynecological examination    sees Dr. Freda Munro   Social History   Socioeconomic History  . Marital status: Married    Spouse name: Not on file  . Number of children: Not on file  . Years of education: Not on file  . Highest education level: Not on file  Occupational History  . Not on file  Social Needs  .  Financial resource strain: Not on file  . Food insecurity    Worry: Not on file    Inability: Not on file  . Transportation needs    Medical: Not on file    Non-medical: Not on file  Tobacco Use  . Smoking status: Never Smoker  . Smokeless tobacco: Never Used  Substance and Sexual Activity  . Alcohol use: No    Alcohol/week: 0.0 standard drinks  . Drug use: No  . Sexual activity: Not on file  Lifestyle  . Physical activity    Days per week: Not on file    Minutes per session: Not on file  . Stress: Not on file  Relationships  . Social Herbalist on phone: Not on file    Gets together: Not on file    Attends religious service: Not on file    Active member of club or organization: Not on file    Attends meetings of clubs or organizations: Not on file    Relationship status:  Not on file  Other Topics Concern  . Not on file  Social History Narrative  . Not on file   Family History  Problem Relation Age of Onset  . Hypertension Other   . Breast cancer Other        mat first cousin's daughter  . Breast cancer Maternal Aunt   . Hypertension Mother   . Breast cancer Maternal Aunt   . Breast cancer Maternal Aunt    Scheduled Meds: . atorvastatin  20 mg Oral Daily  . Chlorhexidine Gluconate Cloth  6 each Topical Daily  . docusate sodium  200 mg Oral BID  . enoxaparin (LOVENOX) injection  40 mg Subcutaneous Q24H  . metoprolol tartrate  25 mg Oral BID  . morphine  15 mg Oral Q12H  . polyethylene glycol  17 g Oral BID   Continuous Infusions: . ceFEPime (MAXIPIME) IV Stopped (04/25/19 1502)   PRN Meds:.acetaminophen **OR** acetaminophen, labetalol, morphine, sodium chloride flush Medications Prior to Admission:  Prior to Admission medications   Medication Sig Start Date End Date Taking? Authorizing Provider  atorvastatin (LIPITOR) 20 MG tablet Take 1 tablet (20 mg total) by mouth daily. 05/31/18  Yes [name], MD  cyclobenzaprine (FLEXERIL) 10 MG tablet Take 1 tablet (10 mg total) by mouth 3 (three) times daily. 03/06/19  Yes Rolm Bookbinder, MD  gabapentin (NEURONTIN) 300 MG capsule Take 1 capsule (300 mg total) by mouth 3 (three) times daily. 04/18/19  Yes Florencia Reasons, MD  nystatin (MYCOSTATIN) 100000 UNIT/ML suspension Take 5 mLs (500,000 Units total) by mouth 4 (four) times daily. 04/18/19  Yes Florencia Reasons, MD  oxyCODONE (OXY IR/ROXICODONE) 5 MG immediate release tablet Take 1 tablet (5 mg total) by mouth every 4 (four) hours as needed for moderate pain. 04/02/19  Yes Magrinat, [name], MD  potassium chloride (K-DUR) 10 MEQ tablet TAKE 1 TABLET BY MOUTH ONCE DAILY Patient taking differently: Take 10 mEq by mouth daily.  11/30/17  Yes [name], MD  loratadine (CLARITIN) 10 MG tablet Take 1 tablet (10 mg total) by mouth daily. Patient not taking: Reported  on 04/23/2019 11/15/18   Magrinat, [name], MD  metoprolol succinate (TOPROL-XL) 50 MG 24 hr tablet Take 1 tablet (50 mg total) by mouth daily. Take with or immediately following a meal. Patient not taking: Reported on 04/23/2019 04/09/19   [name], MD  Omeprazole-Sodium Bicarbonate (ZEGERID) 20-1100 MG CAPS capsule Take 1 capsule  by mouth daily before breakfast. 05/31/18   [name], MD  polyethylene glycol [facility] / Floria Raveling) packet Take 17 g by mouth as needed. Patient taking differently: Take 17 g by mouth daily as needed (constipation). Mix in 8 oz liquid and drink 04/23/12   [name], MD   Allergies  Allergen Reactions  . Barbiturates Other (See Comments)    Keeps pt awake  . Methocarbamol Itching  . Sulfonamide Derivatives Itching   Review of Systems  Constitutional: Positive for activity change, appetite change and fatigue.  Respiratory: Positive for shortness of breath.   Cardiovascular: Positive for chest pain.  Neurological: Positive for weakness.  Psychiatric/Behavioral: Positive for sleep disturbance.   Physical Exam General: Alert, awake, in no acute distress.  Heart: Tachy. No murmur appreciated. Lungs: Good air movement, clear Abdomen: Soft, nontender, nondistended, positive bowel sounds.  Ext: No significant edema Skin: Warm and dry Neuro: Grossly intact, nonfocal.   Vital Signs: BP 116/78   Pulse (!) 107   Temp 98.9 F (37.2 C) (Oral)   Resp (!) 30   Ht 5\' 2"  (1.575 m)   Wt 78.5 kg   SpO2 99%   BMI 31.64 kg/m  Pain Scale: 0-10   Pain Score: 7    SpO2: SpO2: 99 % O2 Device:SpO2: 99 % O2 Flow Rate: .O2 Flow Rate (L/min): 2 L/min  IO: Intake/output summary:   Intake/Output Summary (Last 24 hours) at 04/25/2019 1558 Last data filed at 04/25/2019 1339 Gross per 24 hour  Intake 2260.86 ml  Output 1600 ml  Net 660.86 ml    LBM: Last BM Date: 04/20/19 Baseline Weight: Weight: 78.5 kg Most recent weight: Weight: 78.5 kg      Palliative Assessment/Data:   Flowsheet Rows     Most Recent Value  Intake Tab  Referral Department  Hospitalist  Unit at Time of Referral  Intermediate Care Unit  Palliative Care Primary Diagnosis  Cancer  Date Notified  04/24/19  Palliative Care Type  New Palliative care  Reason for referral  Clarify Goals of Care, Pain, Non-pain Symptom  Date of Admission  04/23/19  Date first seen by Palliative Care  04/25/19  # of days Palliative referral response time  1 Day(s)  # of days IP prior to Palliative referral  1  Clinical Assessment  Palliative Performance Scale Score  50%  Psychosocial & Spiritual Assessment  Palliative Care Outcomes  Patient/Family meeting held?  Yes  Who was at the meeting?  Patient  Palliative Care Outcomes  Improved non-pain symptom therapy      Time In: 1210 Time Out: 1310 Time Total: 60 Greater than 50%  of this time was spent counseling and coordinating care related to the above assessment and plan.  Signed by: Micheline Rough, MD   Please contact Palliative Medicine Team phone at [phone] for questions and concerns.  For individual provider: See Shea Evans

## 2019-04-25 NOTE — Progress Notes (Signed)
Pt with MEWS score of 6 called to see.  Temp 102.5, RR 30, sats 92 on RA,  HR 126.  No sign. Change , elevated Resp rate and HR can be explained with fever.  Dr was notified and temp treated.  Pt spiked temp yesterday at this time.

## 2019-04-25 NOTE — Progress Notes (Signed)
Bosie Helper [place]   DOB:Feb 22, 1971   YS#:[id number]   XNA#:[id number]  Subjective:  Brittany Rodgers did well with her biopsies yesterday. Her pain is much better controlled on current meds. She still has not had a BM. She feels her breathing is better but she hasn't tried walking around yet.   Objective: young African American woman sitting in bedside chair wearing O2 cannula Vitals:   04/25/19 0700 04/25/19 0755  BP: 132/78   Pulse: (!) 110   Resp: 20   Temp:  99.7 F (37.6 C)  SpO2: 99%     Body mass index is 31.64 kg/m.  Intake/Output Summary (Last 24 hours) at 04/25/2019 0844 Last data filed at 04/25/2019 0700 Gross per 24 hour  Intake 2260.86 ml  Output 1250 ml  Net 1010.86 ml       CBG (last 3)  No results for input(s): GLUCAP in the last 72 hours.   Labs:  Lab Results  Component Value Date   WBC 6.8 04/25/2019   HGB 9.0 (L) 04/25/2019   HCT 30.1 (L) 04/25/2019   MCV 89.3 04/25/2019   PLT 298 04/25/2019   NEUTROABS 7.2 04/23/2019    @LASTCHEMISTRY @  Urine Studies No results for input(s): UHGB, CRYS in the last 72 hours.  Invalid input(s): UACOL, UAPR, USPG, UPH, UTP, UGL, UKET, UBIL, UNIT, UROB, ULEU, UEPI, UWBC, URBC, UBAC, CAST, [place], [place]  Basic Metabolic Panel: Recent Labs  Lab 04/23/19 0834 04/23/19 1309 04/24/19 0453 04/25/19 0230  NA 142 139 140 140  K 3.8 3.5 3.9 3.8  CL 102 101 104 104  CO2 28 27 27 26   GLUCOSE 155* 126* 107* 121*  BUN 9 8 10 8   CREATININE 0.93 0.62 0.68 0.65  CALCIUM 10.9* 9.9 9.4 8.7*  MG  --   --   --  1.6*   GFR Estimated Creatinine Clearance: 82.6 mL/min (by C-G formula based on SCr of 0.65 mg/dL). Liver Function Tests: Recent Labs  Lab 04/23/19 0834 04/23/19 1309 04/24/19 0453 04/25/19 0230  AST 9* 12* 10* 11*  ALT 39 36 28 24  ALKPHOS 128* 113 95 83  BILITOT 0.3 0.3 0.4 0.3  PROT 7.6 7.2 6.4* 5.7*  ALBUMIN 3.6 3.6 3.1* 2.7*   No results for input(s): LIPASE, AMYLASE in the last 168 hours. No results for  input(s): AMMONIA in the last 168 hours. Coagulation profile No results for input(s): INR, PROTIME in the last 168 hours.  CBC: Recent Labs  Lab 04/23/19 0834 04/23/19 1309 04/24/19 0453 04/25/19 0230  WBC 9.7 10.1 7.5 6.8  NEUTROABS 7.4 7.2  --   --   HGB 11.3* 10.8* 9.4* 9.0*  HCT 37.8 35.6* 32.4* 30.1*  MCV 86.3 87.5 88.8 89.3  PLT 383 364 319 298   Cardiac Enzymes: No results for input(s): CKTOTAL, CKMB, CKMBINDEX, TROPONINI in the last 168 hours. BNP: Invalid input(s): POCBNP CBG: No results for input(s): GLUCAP in the last 168 hours. D-Dimer No results for input(s): DDIMER in the last 72 hours. Hgb A1c No results for input(s): HGBA1C in the last 72 hours. Lipid Profile No results for input(s): CHOL, HDL, LDLCALC, TRIG, CHOLHDL, LDLDIRECT in the last 72 hours. Thyroid function studies Recent Labs    04/23/19 1306  TSH 1.334   Anemia work up No results for input(s): VITAMINB12, FOLATE, FERRITIN, TIBC, IRON, RETICCTPCT in the last 72 hours. Microbiology Recent Results (from the past 240 hour(s))  SARS Coronavirus 2 (CEPHEID - Performed in [facility] lab), [facility]  Status: None   Collection Time: 04/23/19  1:06 PM   Specimen: Nasopharyngeal Swab  Result Value Ref Range Status   SARS Coronavirus 2 NEGATIVE NEGATIVE Final    Comment: (NOTE) If result is NEGATIVE SARS-CoV-2 target nucleic acids are NOT DETECTED. The SARS-CoV-2 RNA is generally detectable in upper and lower  respiratory specimens during the acute phase of infection. The lowest  concentration of SARS-CoV-2 viral copies this assay can detect is 250  copies / mL. A negative result does not preclude SARS-CoV-2 infection  and should not be used as the sole basis for treatment or other  patient management decisions.  A negative result may occur with  improper specimen collection / handling, submission of specimen other  than nasopharyngeal swab, presence of viral mutation(s) within the   areas targeted by this assay, and inadequate number of viral copies  (<250 copies / mL). A negative result must be combined with clinical  observations, patient history, and epidemiological information. If result is POSITIVE SARS-CoV-2 target nucleic acids are DETECTED. The SARS-CoV-2 RNA is generally detectable in upper and lower  respiratory specimens dur ing the acute phase of infection.  Positive  results are indicative of active infection with SARS-CoV-2.  Clinical  correlation with patient history and other diagnostic information is  necessary to determine patient infection status.  Positive results do  not rule out bacterial infection or co-infection with other viruses. If result is PRESUMPTIVE POSTIVE SARS-CoV-2 nucleic acids MAY BE PRESENT.   A presumptive positive result was obtained on the submitted specimen  and confirmed on repeat testing.  While 2019 novel coronavirus  (SARS-CoV-2) nucleic acids may be present in the submitted sample  additional confirmatory testing may be necessary for epidemiological  and / or clinical management purposes  to differentiate between  SARS-CoV-2 and other Sarbecovirus currently known to infect humans.  If clinically indicated additional testing with an alternate test  methodology [phone]) is advised. The SARS-CoV-2 RNA is generally  detectable in upper and lower respiratory sp ecimens during the acute  phase of infection. The expected result is Negative. Fact Sheet for Patients:  StrictlyIdeas.no Fact Sheet for Healthcare Providers: BankingDealers.co.za This test is not yet approved or cleared by the [place] FDA and has been authorized for detection and/or diagnosis of SARS-CoV-2 by FDA under an Emergency Use Authorization (EUA).  This EUA will remain in effect (meaning this test can be used) for the duration of the COVID-19 declaration under Section 564(b)(1) of the Act, 21  U.S.C. section 360bbb-3(b)(1), unless the authorization is terminated or revoked sooner. Performed at [facility], [place] [address], [place], [place] [address]   Gram stain     Status: None   Collection Time: 04/24/19 10:37 AM   Specimen: Pleura  Result Value Ref Range Status   Specimen Description PLEURAL RIGHT  Final   Special Requests NONE  Final   Gram Stain   Final    FEW WBC PRESENT, PREDOMINANTLY MONONUCLEAR NO ORGANISMS SEEN Performed at [facility], [address], [place], [place] [address]    Report Status 04/24/2019 FINAL  Final  Culture, blood (routine x 2)     Status: None (Preliminary result)   Collection Time: 04/24/19  1:35 PM   Specimen: BLOOD LEFT HAND  Result Value Ref Range Status   Specimen Description   Final    BLOOD LEFT HAND Performed at [facility] [address], [place], [place] [address]    Special  Requests   Final    BOTTLES DRAWN AEROBIC ONLY Blood Culture adequate volume Performed at [facility] [address] [place], [place] [address]    Culture PENDING  Incomplete   Report Status PENDING  Incomplete      Studies:  Dg Chest 2 View  Result Date: 04/23/2019 CLINICAL DATA:  Shortness of breath, tachycardia and lethargy. History of metastatic breast cancer. EXAM: CHEST - 2 VIEW COMPARISON:  Chest x-ray 04/16/2019 FINDINGS: The power port is stable. Persistent right lower lobe process which appears to be a combination effusion and atelectasis based on prior chest CT. There is also persistent streaky left basilar atelectasis. IMPRESSION: 1. Stable/persistent right lower lobe process likely combination of effusion and atelectasis. 2. Stable streaky left basilar atelectasis. Electronically Signed   By: Marijo Sanes M.D.   On: 04/23/2019 11:27   Ct Angio Chest Pe W/cm &/or Wo Cm  Result Date: 04/23/2019 CLINICAL DATA:  Shortness of breath, history of metastatic breast cancer EXAM: CT  ANGIOGRAPHY CHEST WITH CONTRAST TECHNIQUE: Multidetector CT imaging of the chest was performed using the standard protocol during bolus administration of intravenous contrast. Multiplanar CT image reconstructions and MIPs were obtained to evaluate the vascular anatomy. CONTRAST:  11mL OMNIPAQUE IOHEXOL 350 MG/ML SOLN COMPARISON:  04/11/2019 FINDINGS: Cardiovascular: Satisfactory opacification of the pulmonary arteries to the segmental level. No evidence of pulmonary embolism. Normal heart size. No pericardial effusion. Right chest port catheter. Mediastinum/Nodes: No enlarged mediastinal, hilar, or axillary lymph nodes. Thyroid gland, trachea, and esophagus demonstrate no significant findings. Lungs/Pleura: Moderate to large right pleural effusion and associated atelectasis or consolidation, slightly increased compared to prior examination. There are multiple small bilateral pulmonary nodules, which are new or enlarged compared to prior examination, for example 5 mm nodule of the left upper lobe (series 10, image 37). Upper Abdomen: No acute abnormality. Musculoskeletal: Status post right mastectomy. There is a redemonstrated destructive soft tissue lesion of the anterior right chest wall and pectoral musculature, which is difficult to distinguish from adjacent pleural fluid and mostly appreciated by the destructive effect on the right anterior fourth rib. There has been interval enlargement of adjacent soft tissue nodularity of the chest wall (e.g. Series 5, image 166) Review of the MIP images confirms the above findings. IMPRESSION: 1.  Negative examination for pulmonary embolism. 2. Moderate to large right pleural effusion and associated atelectasis or consolidation, slightly increased compared to prior examination. 3. There are multiple small bilateral pulmonary nodules, which are new or enlarged compared to prior examination, for example 5 mm nodule of the left upper lobe (series 10, image 37). Findings are  most consistent with pulmonary metastatic disease, although atypical infection is not excluded given very rapid interval development in comparison to CT dated 04/11/2019. 4. Status post right mastectomy. There is a redemonstrated destructive soft tissue lesion of the anterior right chest wall and pectoral musculature, which is difficult to distinguish from adjacent pleural fluid and mostly appreciated by the destructive effect on the right anterior fourth rib. There has been interval enlargement of adjacent soft tissue nodularity of the chest wall (e.g. Series 5, image 166) Electronically Signed   By: Eddie Candle M.D.   On: 04/23/2019 15:22   Ct Biopsy  Result Date: 04/24/2019 CLINICAL DATA:  History of breast carcinoma. Enlarging anterior right chest wall mass. EXAM: CT GUIDED CORE BIOPSY OF RIGHT ANTERIOR CHEST WALL MASS ANESTHESIA/SEDATION: Intravenous Fentanyl 67mcg and Versed 1mg  were administered as conscious sedation during continuous monitoring of  the patient's level of consciousness and physiological / cardiorespiratory status by the radiology RN, with a total moderate sedation time of 10 minutes. PROCEDURE: The procedure risks, benefits, and alternatives were explained to the patient. Questions regarding the procedure were encouraged and answered. The patient understands and consents to the procedure. Select axial scans through the lower chest were obtained. The right anterior chest wall mass was localized and an appropriate skin entry site was determined and marked. The operative field was prepped with chlorhexidinein a sterile fashion, and a sterile drape was applied covering the operative field. A sterile gown and sterile gloves were used for the procedure. Local anesthesia was provided with 1% Lidocaine. Under CT fluoroscopic guidance, a 17 gauge trocar needle was advanced to the margin of the lesion. Once needle tip position was confirmed, coaxial 18-gauge core biopsy samples were obtained,  submitted in formalin to surgical pathology. The guide needle was removed. Postprocedure scans show no hemorrhage or other apparent complication. The patient tolerated the procedure well. COMPLICATIONS: None immediate FINDINGS: Right anterior chest wall mass was localized. Representative core biopsy samples obtained as above. IMPRESSION: 1. Technically successful CT-guided core biopsy, right anterior chest wall mass. Electronically Signed   By: Lucrezia Europe M.D.   On: 04/24/2019 14:27   US Thoracentesis Asp Pleural Space W/img Guide  Result Date: 04/24/2019 INDICATION: Patient with history of right breast cancer, dyspnea, and recurrent right pleural effusion. Request is made for diagnostic and therapeutic right thoracentesis. EXAM: ULTRASOUND GUIDED DIAGNOSTIC AND THERAPEUTIC RIGHT THORACENTESIS MEDICATIONS: 10 mL 1% lidocaine 1 mg morphine IV COMPLICATIONS: None immediate. PROCEDURE: An ultrasound guided thoracentesis was thoroughly discussed with the patient and questions answered. The benefits, risks, alternatives and complications were also discussed. The patient understands and wishes to proceed with the procedure. Written consent was obtained. Ultrasound was performed to localize and mark an adequate pocket of fluid in the right chest. The area was then prepped and draped in the normal sterile fashion. 1% Lidocaine was used for local anesthesia. Under ultrasound guidance a 6 Fr Safe-T-Centesis catheter was introduced. Thoracentesis was performed. The catheter was removed and a dressing applied. FINDINGS: A total of approximately 650 mL of dark red fluid was removed. Samples were sent to the laboratory as requested by the clinical team. IMPRESSION: Successful ultrasound guided right thoracentesis yielding 650 mL of pleural fluid. Read by: Earley Abide, PA-C No pneumothorax on follow-up CT. Electronically Signed   By: Lucrezia Europe M.D.   On: 04/24/2019 12:52    Assessment: 48 y.o. Fern[place] Parkins, [place] woman  status post right breast lower outer quadrant biopsy 10/29/2018 for a clinical T2 N0, stage IIB invasive ductal carcinoma, grade 3, triple negative, with an MIB-1 of 80%             (a) CT of the chest 11/15/2018 shows possible liver metastases, but liver biopsy 11/25/2018 consistent with hemangioma             (b) bone scan 11/15/2018 shows no suspicious bone lesions  (1) neoadjuvant chemotherapy consisting of doxorubicin and cyclophosphamide in dose dense fashion x4, started 11/26/2018, completed 01/07/2019, followed by weekly paclitaxel and carboplatin x12 starting 01/28/2019  (2) patient desires ovarian function preservation:              (a) Zoladex started 11/08/2018, discontinued 02/04/2019             (b) patient is status post bilateral tubal ligation  (3) status post right mastectomy with sentinel lymph node sampling 03/04/2019  for a ypTac ypN1a invasive ductal carcinoma grade 3, repeat prognostic panel again triple negative             (a) a total of 6 axillary lymph nodes removed, 2 sentinel ones, 1 positive  (4) adjuvant radiation to be considered  (5) genetics counseling on 11/28/2018, testing was recommended, but declined by patient.  (6) Staging studies:             (a) chest CT scan and bone scan 11/15/2018 show a liver mass, no lung or bone lesions             (b) liver biopsy 11/25/2018 shows a hemangioma             (c) chest CT Angio 03/26/2019 again shows liver mass, right breast "seroma"  (d) cytology from R thoracentesis 04/12/2019 nondiagnostic  (e) CT angio 04/23/2019 shows recurrent right effusion, multiple bilateral pulmonary nodules, invasive anterior right chest wall lesion  (7) pembrolizumab started 04/02/2019, to be repeated every 21 days  (8) CT angio 04/23/2019 shows new lung nodules, likely metastatic  (9) repeat thoracentesis 04/24/2019-- cytology pending  (10) biopsy R chest wall mass 04/24/2019, results pending  (a) consider CARIS  study    Plan:  Brittany Rodgers's pain is better controlled on current meds. I would use only po morphine. Discussed that the long acting form needs to be taken BID, NOT PRN; and the [place] acting form is PRN. If she is requiring multiple breakthrough pain doses would increase MSContin to 30 mg BID  She still has not had a BM. [place] written for mag citrate. Contiinue bowel prophyulaxis.  I let her know I expect her biopsies to show recurrent breast cancer. She understands for triple-negative disease chemo is the only proven option, and that the chance of meaningful response is in the 30% range. I will plan to see her next week (likely 08/06) to discuss biopsy results and to initiate chemo, while continuing the pembrolizumab. She is agreeable though "hoping it's not the cancer" that is causing her symptoms.  I let her know palliative care likely would drop by to help her sort out her options.  Appreciate your care of this patient. Please let me know if I can be of further help at this point           Chauncey Cruel, MD 04/25/2019  8:44 AM Medical Oncology and Hematology [facility] [place], [place] [address] Tel. [phone]    Fax. [phone]

## 2019-04-25 NOTE — Progress Notes (Signed)
   04/25/19 1718  Vitals  Temp (!) 102 F (38.9 C)  Temp Source Oral  BP (!) 158/77  MAP (mmHg) 101  BP Location Left Leg  BP Method Automatic  Patient Position (if appropriate) Sitting  Pulse Rate (!) 122  Resp (!) 30  Oxygen Therapy  SpO2 90 %  O2 Device Room Air  MEWS Score  MEWS RR 2  MEWS Pulse 2  MEWS Systolic 0  MEWS LOC 0  MEWS Temp 2  MEWS Score 6  MEWS Score Color Red   Rapid response called, Per ruby, administer tylenol at this time. Confirm with MD related to labetalol. RN will continue to monitor patient.

## 2019-04-25 NOTE — Progress Notes (Signed)
PROGRESS NOTE    Brittany Rodgers  VWP:[id number] DOB: 1971-01-07 DOA: 04/23/2019 PCP: [name], MD   Brief Narrative:  Brittany Rodgers is Brittany Rodgers 48 y.o. female with medical history significant of metastatic breast cancer s/p mastectomy presents to ED with complaints of increased sob and lethargy. Of note, pt is tired appearing and consequently Brittany Rodgers poor historian at this time. Per chart review, pt was seen recently in Oncology clinic on 7/28, where pt was noted to have new chest wall mass as well as liver mass. Pt was being considered for possible bipopsy. On day of admit, pt noted to be increasingly sob with tachycardia and was subseqently referred to ED from Oncology clinic.  ED Course: In the ED, pt was noted to have HR in excess of 140's, ultimately improved to the 110's with one dose of IV metoprolol. Pt underwent CT chest with findings worrisome for large R sided effusion. Pt remained on minimal O2 support. IR was consulted by EDP and hospitalist consulted for consideration for admission.  Assessment & Plan:   Principal Problem:   Pleural effusion Active Problems:   IBS   Hyperlipidemia   Chronic right hip pain   Malignant neoplasm of lower-outer quadrant of right breast of female, estrogen receptor negative (HCC)   S/P mastectomy   Pleural effusion on right  # SIRS with concern for sepsis   Presyncope: fever, tachycardia, concerning for infection/sepsis.  Will bolus and start on vanc/cefepime.  She's currently on RA without tachypnea and has Adisa Litt normal WBC count. - Could be related to pleural effusion? Vs UTI with UA with >50 WBC and large LE. - urine cx with multiple species - blood cx NGTD, body fluid culture NG - continue vanc and cefepime -> will narrow to cefepime - continue to monitor, appears to be improving  1. Malignant Pleural Effusion 1. Currently on minimal O2 support 2. CT with moderate to large R pleural effusion with associated atelectasis or  consolidation 3. Now s/p thoracentesis 4. Follow culture, gram stain without organism.  Follow cytology (with malignant cells consistent with metastatic adenocarcinoma).  Acid fast smear and culture pending.  Exudative by lights.    2. Stage IV Breast cancer, s/p mastectomy 1. Followed by [name] 2. New chest wall mass and liver lesion noted recently 3. Pt also has mutliple small bilateral pulm nodules, new or enlarged from prior exam concerning for metastatic disease (vs infection) 4. Appreciate 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 Continue pain medications 6. Appreciate palliative care for assistance with [place] conversations  3. HLD 1. Seems stable. 2. Cont statin per home regimen  4. Sinus tach 1. Hr improved today, still mildly tachy, but better 2. Continue metoprolol  5. Hx IBS 1. Seems stable at this time  6. Chronic pain 1. Will continue home pain regimen -> now adjusted to include MSIR and MS contin  DVT prophylaxis: lovenox Code Status: full  Family Communication: discussed with husband Disposition Plan: pending further w/u and imrpovement   Consultants:   IR  Oncology  Palliative care  Procedures:  CT guided core bx R anterior chest wall mass 7/30 US thoracentesis 7/30  Antimicrobials:  Anti-infectives (From admission, onward)   Start     Dose/Rate Route Frequency Ordered Stop   04/25/19 0400  vancomycin (VANCOCIN) IVPB 750 mg/150 ml premix  750 mg 150 mL/hr over 60 Minutes Intravenous Every 12 hours 04/24/19 1332     04/24/19 2200  ceFEPIme (MAXIPIME) 2 g in sodium chloride 0.9 % 100 mL IVPB     2 g 200 mL/hr over 30 Minutes Intravenous Every 8 hours 04/24/19 1309     04/24/19 1315  ceFEPIme (MAXIPIME) 2 g in sodium chloride 0.9 % 100 mL IVPB     2 g 200 mL/hr over 30 Minutes Intravenous  STAT 04/24/19 1307 04/24/19 1536   04/24/19 1315  vancomycin (VANCOCIN) 1,750 mg in sodium chloride 0.9 % 500 mL IVPB     1,750 mg 250 mL/hr over 120 Minutes Intravenous STAT 04/24/19 1307 04/24/19 1915     Subjective: Doing ok today  Objective: Vitals:   04/25/19 0755 04/25/19 1100 04/25/19 1130 04/25/19 1200  BP:  99/69  116/78  Pulse:  92  (!) 107  Resp:  19  (!) 30  Temp: 99.7 F (37.6 C)  98.9 F (37.2 C)   TempSrc: Oral  Oral   SpO2:  100%  99%  Weight:      Height:        Intake/Output Summary (Last 24 hours) at 04/25/2019 1440 Last data filed at 04/25/2019 1339 Gross per 24 hour  Intake 2260.86 ml  Output 1600 ml  Net 660.86 ml   Filed Weights   04/24/19 0003  Weight: 78.5 kg    Examination:  General: No acute distress. Cardiovascular: Heart sounds show Hamed Debella tachy rate, and rhythm. Lungs: Clear to auscultation bilaterally  Abdomen: Soft, nontender, nondistended Neurological: Alert and oriented 3. Moves all extremities 4. Cranial nerves II through XII grossly intact. Skin: Warm and dry. No rashes or lesions. Extremities: No clubbing or cyanosis. No edema.  Data Reviewed: I have personally reviewed following labs and imaging studies  CBC: Recent Labs  Lab 04/23/19 0834 04/23/19 1309 04/24/19 0453 04/25/19 0230  WBC 9.7 10.1 7.5 6.8  NEUTROABS 7.4 7.2  --   --   HGB 11.3* 10.8* 9.4* 9.0*  HCT 37.8 35.6* 32.4* 30.1*  MCV 86.3 87.5 88.8 89.3  PLT 383 364 319 094   Basic Metabolic Panel: Recent Labs  Lab 04/23/19 0834 04/23/19 1309 04/24/19 0453 04/25/19 0230  NA 142 139 140 140  K 3.8 3.5 3.9 3.8  CL 102 101 104 104  CO2 28 27 27 26   GLUCOSE 155* 126* 107* 121*  BUN 9 8 10 8   CREATININE 0.93 0.62 0.68 0.65  CALCIUM 10.9* 9.9 9.4 8.7*  MG  --   --   --  1.6*   GFR: Estimated Creatinine Clearance: 82.6 mL/min (by C-G formula based on SCr of 0.65 mg/dL). Liver Function Tests: Recent Labs  Lab 04/23/19 0834 04/23/19 1309 04/24/19 0453  04/25/19 0230  AST 9* 12* 10* 11*  ALT 39 36 28 24  ALKPHOS 128* 113 95 83  BILITOT 0.3 0.3 0.4 0.3  PROT 7.6 7.2 6.4* 5.7*  ALBUMIN 3.6 3.6 3.1* 2.7*   No results for input(s): LIPASE, AMYLASE in the last 168 hours. No results for input(s): AMMONIA in the last 168 hours. Coagulation Profile: No results for input(s): INR, PROTIME in the last 168 hours. Cardiac Enzymes: No results for input(s): CKTOTAL, CKMB, CKMBINDEX, TROPONINI in the last 168 hours. BNP (last 3 results) No results for input(s): PROBNP in the last 8760 hours. HbA1C: No results for input(s): HGBA1C in the last 72 hours. CBG: No results for input(s): GLUCAP in the last 168 hours.  Lipid Profile: No results for input(s): CHOL, HDL, LDLCALC, TRIG, CHOLHDL, LDLDIRECT in the last 72 hours. Thyroid Function Tests: Recent Labs    04/23/19 1306  TSH 1.334   Anemia Panel: No results for input(s): VITAMINB12, FOLATE, FERRITIN, TIBC, IRON, RETICCTPCT in the last 72 hours. Sepsis Labs: Recent Labs  Lab 04/24/19 1650 04/24/19 1932 04/25/19 0230  PROCALCITON  --   --  <0.10  LATICACIDVEN 2.1* 1.9  --     Recent Results (from the past 240 hour(s))  SARS Coronavirus 2 (CEPHEID - Performed in [facility] hospital lab), Hosp Order     Status: None   Collection Time: 04/23/19  1:06 PM   Specimen: Nasopharyngeal Swab  Result Value Ref Range Status   SARS Coronavirus 2 NEGATIVE NEGATIVE Final    Comment: (NOTE) If result is NEGATIVE SARS-CoV-2 target nucleic acids are NOT DETECTED. The SARS-CoV-2 RNA is generally detectable in upper and lower  respiratory specimens during the acute phase of infection. The lowest  concentration of SARS-CoV-2 viral copies this assay can detect is 250  copies / mL. Libertie Hausler negative result does not preclude SARS-CoV-2 infection  and should not be used as the sole basis for treatment or other  patient management decisions.  Zay Yeargan negative result may occur with  improper specimen collection /  handling, submission of specimen other  than nasopharyngeal swab, presence of viral mutation(s) within the  areas targeted by this assay, and inadequate number of viral copies  (<250 copies / mL). Darrow Barreiro negative result must be combined with clinical  observations, patient history, and epidemiological information. If result is POSITIVE SARS-CoV-2 target nucleic acids are DETECTED. The SARS-CoV-2 RNA is generally detectable in upper and lower  respiratory specimens dur ing the acute phase of infection.  Positive  results are indicative of active infection with SARS-CoV-2.  Clinical  correlation with patient history and other diagnostic information is  necessary to determine patient infection status.  Positive results do  not rule out bacterial infection or co-infection with other viruses. If result is PRESUMPTIVE POSTIVE SARS-CoV-2 nucleic acids MAY BE PRESENT.   Jachin Coury presumptive positive result was obtained on the submitted specimen  and confirmed on repeat testing.  While 2019 novel coronavirus  (SARS-CoV-2) nucleic acids may be present in the submitted sample  additional confirmatory testing may be necessary for epidemiological  and / or clinical management purposes  to differentiate between  SARS-CoV-2 and other Sarbecovirus currently known to infect humans.  If clinically indicated additional testing with an alternate test  methodology [phone]) is advised. The SARS-CoV-2 RNA is generally  detectable in upper and lower respiratory sp ecimens during the acute  phase of infection. The expected result is Negative. Fact Sheet for Patients:  StrictlyIdeas.no Fact Sheet for Healthcare Providers: BankingDealers.co.za This test is not yet approved or cleared by the [place] FDA and has been authorized for detection and/or diagnosis of SARS-CoV-2 by FDA under an Emergency Use Authorization (EUA).  This EUA will remain in effect (meaning this  test can be used) for the duration of the COVID-19 declaration under Section 564(b)(1) of the Act, 21 U.S.C. section 360bbb-3(b)(1), unless the authorization is terminated or revoked sooner. Performed at [facility], [place] [address] [place], [place] [address]   Culture, body fluid-bottle     Status: None (Preliminary result)   Collection Time: 04/24/19 10:37 AM   Specimen: Pleura  Result Value Ref Range Status   Specimen Description PLEURAL RIGHT  Final   Special  Requests NONE  Final   Culture   Final    NO GROWTH 1 DAY Performed at [facility], [place] [address] [place], [place] [address]    Report Status PENDING  Incomplete  Gram stain     Status: None   Collection Time: 04/24/19 10:37 AM   Specimen: Pleura  Result Value Ref Range Status   Specimen Description PLEURAL RIGHT  Final   Special Requests NONE  Final   Gram Stain   Final    FEW WBC PRESENT, PREDOMINANTLY MONONUCLEAR NO ORGANISMS SEEN Performed at [facility], [address] [place], [place] [address]    Report Status 04/24/2019 FINAL  Final  Culture, blood (routine x 2)     Status: None (Preliminary result)   Collection Time: 04/24/19  1:35 PM   Specimen: BLOOD LEFT HAND  Result Value Ref Range Status   Specimen Description   Final    BLOOD LEFT HAND Performed at [place] [address] [place], [place] [address]    Special Requests   Final    BOTTLES DRAWN AEROBIC ONLY Blood Culture adequate volume   Culture   Final    NO GROWTH < 24 HOURS Performed at [facility], [place] [address] [place], [place] [address]    Report Status PENDING  Incomplete  Culture, blood (routine x 2)     Status: None (Preliminary result)   Collection Time: 04/24/19  1:35 PM   Specimen: BLOOD LEFT HAND  Result Value Ref Range Status   Specimen Description   Final    BLOOD LEFT HAND Performed at [place] [place] [address], [place], [place]  [address]    Special Requests   Final    BOTTLES DRAWN AEROBIC ONLY Blood Culture results may not be optimal due to an inadequate volume of blood received in culture bottles Performed at [facility] [address], [place], [place] [address]    Culture   Final    NO GROWTH < 24 HOURS Performed at [place] [address] [place], [place] [address]    Report Status PENDING  Incomplete  Culture, Urine     Status: Abnormal   Collection Time: 04/24/19  4:50 PM   Specimen: Urine, Random  Result Value Ref Range Status   Specimen Description   Final    URINE, RANDOM Performed at [facility] [address] [place], [place] [address]    Special Requests   Final    NONE Performed at [facility], [place] [address] [place], [place] [address]    Culture MULTIPLE SPECIES PRESENT, SUGGEST RECOLLECTION (Briley Bumgarner)  Final   Report Status 04/25/2019 FINAL  Final         Radiology Studies: Ct Angio Chest Pe W/cm &/or Wo Cm  Result Date: 04/23/2019 CLINICAL DATA:  Shortness of breath, history of metastatic breast cancer EXAM: CT ANGIOGRAPHY CHEST WITH CONTRAST TECHNIQUE: Multidetector CT imaging of the chest was performed using the standard protocol during bolus administration of intravenous contrast. Multiplanar CT image reconstructions and MIPs were obtained to evaluate the vascular anatomy. CONTRAST:  1103mL OMNIPAQUE IOHEXOL 350 MG/ML SOLN COMPARISON:  04/11/2019 FINDINGS: Cardiovascular: Satisfactory opacification of the pulmonary arteries to the segmental level. No evidence of pulmonary embolism. Normal heart size. No pericardial effusion. Right chest port catheter. Mediastinum/Nodes: No enlarged mediastinal, hilar, or axillary lymph nodes. Thyroid gland, trachea, and esophagus demonstrate no significant  findings. Lungs/Pleura: Moderate to large right pleural effusion and associated atelectasis or consolidation, slightly increased  compared to prior examination. There are multiple small bilateral pulmonary nodules, which are new or enlarged compared to prior examination, for example 5 mm nodule of the left upper lobe (series 10, image 37). Upper Abdomen: No acute abnormality. Musculoskeletal: Status post right mastectomy. There is Londan Coplen redemonstrated destructive soft tissue lesion of the anterior right chest wall and pectoral musculature, which is difficult to distinguish from adjacent pleural fluid and mostly appreciated by the destructive effect on the right anterior fourth rib. There has been interval enlargement of adjacent soft tissue nodularity of the chest wall (e.g. Series 5, image 166) Review of the MIP images confirms the above findings. IMPRESSION: 1.  Negative examination for pulmonary embolism. 2. Moderate to large right pleural effusion and associated atelectasis or consolidation, slightly increased compared to prior examination. 3. There are multiple small bilateral pulmonary nodules, which are new or enlarged compared to prior examination, for example 5 mm nodule of the left upper lobe (series 10, image 37). Findings are most consistent with pulmonary metastatic disease, although atypical infection is not excluded given very rapid interval development in comparison to CT dated 04/11/2019. 4. Status post right mastectomy. There is Dao Memmott redemonstrated destructive soft tissue lesion of the anterior right chest wall and pectoral musculature, which is difficult to distinguish from adjacent pleural fluid and mostly appreciated by the destructive effect on the right anterior fourth rib. There has been interval enlargement of adjacent soft tissue nodularity of the chest wall (e.g. Series 5, image 166) Electronically Signed   By: Eddie Candle M.D.   On: 04/23/2019 15:22   Ct Biopsy  Result Date: 04/24/2019 CLINICAL DATA:  History of breast carcinoma. Enlarging anterior right chest wall mass. EXAM: CT GUIDED CORE BIOPSY OF RIGHT ANTERIOR  CHEST WALL MASS ANESTHESIA/SEDATION: Intravenous Fentanyl 54mcg and Versed 1mg  were administered as conscious sedation during continuous monitoring of the patient's level of consciousness and physiological / cardiorespiratory status by the radiology RN, with Oluwademilade Mckiver total moderate sedation time of 10 minutes. PROCEDURE: The procedure risks, benefits, and alternatives were explained to the patient. Questions regarding the procedure were encouraged and answered. The patient understands and consents to the procedure. Select axial scans through the lower chest were obtained. The right anterior chest wall mass was localized and an appropriate skin entry site was determined and marked. The operative field was prepped with chlorhexidinein Jonmarc Bodkin sterile fashion, and Deborahann Poteat sterile drape was applied covering the operative field. Aslan Himes sterile gown and sterile gloves were used for the procedure. Local anesthesia was provided with 1% Lidocaine. Under CT fluoroscopic guidance, Zyshonne Malecha 17 gauge trocar needle was advanced to the margin of the lesion. Once needle tip position was confirmed, coaxial 18-gauge core biopsy samples were obtained, submitted in formalin to surgical pathology. The guide needle was removed. Postprocedure scans show no hemorrhage or other apparent complication. The patient tolerated the procedure well. COMPLICATIONS: None immediate FINDINGS: Right anterior chest wall mass was localized. Representative core biopsy samples obtained as above. IMPRESSION: 1. Technically successful CT-guided core biopsy, right anterior chest wall mass. Electronically Signed   By: Lucrezia Europe M.D.   On: 04/24/2019 14:27   US Thoracentesis Asp Pleural Space W/img Guide  Result Date: 04/24/2019 INDICATION: Patient with history of right breast cancer, dyspnea, and recurrent right pleural effusion. Request is made for diagnostic and therapeutic right thoracentesis. EXAM: ULTRASOUND GUIDED DIAGNOSTIC AND THERAPEUTIC RIGHT THORACENTESIS MEDICATIONS: 10 mL  1%  lidocaine 1 mg morphine IV COMPLICATIONS: None immediate. PROCEDURE: An ultrasound guided thoracentesis was thoroughly discussed with the patient and questions answered. The benefits, risks, alternatives and complications were also discussed. The patient understands and wishes to proceed with the procedure. Written consent was obtained. Ultrasound was performed to localize and mark an adequate pocket of fluid in the right chest. The area was then prepped and draped in the normal sterile fashion. 1% Lidocaine was used for local anesthesia. Under ultrasound guidance Sheryl Towell 6 Fr Safe-T-Centesis catheter was introduced. Thoracentesis was performed. The catheter was removed and Alcee Sipos dressing applied. FINDINGS: Tyreshia Ingman total of approximately 650 mL of dark red fluid was removed. Samples were sent to the laboratory as requested by the clinical team. IMPRESSION: Successful ultrasound guided right thoracentesis yielding 650 mL of pleural fluid. Read by: Earley Abide, PA-C No pneumothorax on follow-up CT. Electronically Signed   By: Lucrezia Europe M.D.   On: 04/24/2019 12:52        Scheduled Meds:  atorvastatin  20 mg Oral Daily   Chlorhexidine Gluconate Cloth  6 each Topical Daily   docusate sodium  200 mg Oral BID   enoxaparin (LOVENOX) injection  40 mg Subcutaneous Q24H   metoprolol tartrate  25 mg Oral BID   morphine  15 mg Oral Q12H   polyethylene glycol  17 g Oral BID   Continuous Infusions:  ceFEPime (MAXIPIME) IV 2 g (04/25/19 1432)   vancomycin Stopped (04/25/19 0500)     LOS: 2 days    Time spent: over 30 min    Fayrene Helper, MD [facility] Pager AMION  If 7PM-7AM, please contact night-coverage [email or web address] Password [email or web address] 04/25/2019, 2:40 PM

## 2019-04-25 NOTE — Progress Notes (Signed)
MD at bedside. Chest x-ray ordered. Other news orders being put in.

## 2019-04-25 NOTE — Progress Notes (Signed)
Patient transferred from stepdown. Resting comfortably due to the pain medication that was given in stepdown. Bed alarm on. Call bell within reach.

## 2019-04-25 NOTE — Progress Notes (Signed)
DISCONTINUE ON PATHWAY REGIMEN - Breast     A cycle is every 14 days (cycles 1-4):     Doxorubicin      Cyclophosphamide      Pegfilgrastim-xxxx    A cycle is every 21 days (cycles 5-8):     Paclitaxel      Carboplatin   **Always confirm dose/schedule in your pharmacy ordering system**  REASON: Disease Progression PRIOR TREATMENT: [id number]: Dose-Dense AC [Doxorubicin + Cyclophosphamide q14 Days x 4 Cycles], Followed by Paclitaxel 80 mg/m2 Weekly + Carboplatin AUC=6 q21 Days x 12 Weeks TREATMENT RESPONSE: Progressive Disease (PD)  START OFF PATHWAY REGIMEN - Breast   [id number]:Gemcitabine + Carboplatin (1000/2) q21 Days:   A cycle is every 21 days:     Gemcitabine      Carboplatin   **Always confirm dose/schedule in your pharmacy ordering system**  Patient Characteristics: Distant Metastases or Locoregional Recurrent Disease - Unresected or Locally Advanced Unresectable Disease Progressing after Neoadjuvant and Local Therapies, HER2 Negative/Unknown/Equivocal, ER Negative/Unknown, Chemotherapy, First Line, ER Negative,  PD-L1 Expression Negative/Unknown Therapeutic Status: Distant Metastases BRCA Mutation Status: Awaiting Test Results ER Status: Negative (-) HER2 Status: Negative (-) PR Status: Negative (-) Line of Therapy: First Line PD-L1 Expression Status: Awaiting Test Results Intent of Therapy: Non-Curative / Palliative Intent, Discussed with Patient

## 2019-04-26 LAB — COMPREHENSIVE METABOLIC PANEL
ALT: 22 U/L (ref 0–44)
AST: 9 U/L — ABNORMAL LOW (ref 15–41)
Albumin: 3.1 g/dL — ABNORMAL LOW (ref 3.5–5.0)
Alkaline Phosphatase: 85 U/L (ref 38–126)
Anion gap: 9 (ref 5–15)
BUN: 8 mg/dL (ref 6–20)
CO2: 28 mmol/L (ref 22–32)
Calcium: 9.7 mg/dL (ref 8.9–10.3)
Chloride: 103 mmol/L (ref 98–111)
Creatinine, Ser: 0.69 mg/dL (ref 0.44–1.00)
GFR calc Af Amer: >60 mL/min (ref >60)
GFR calc non Af Amer: >60 mL/min (ref >60)
Glucose, Bld: 104 mg/dL — ABNORMAL HIGH (ref 70–99)
Potassium: 4 mmol/L (ref 3.5–5.1)
Sodium: 140 mmol/L (ref 135–145)
Total Bilirubin: 0.6 mg/dL (ref 0.3–1.2)
Total Protein: 6.8 g/dL (ref 6.5–8.1)

## 2019-04-26 LAB — PROCALCITONIN: Procalcitonin: <0.1 ng/mL

## 2019-04-26 LAB — CBC
HCT: 35.2 % — ABNORMAL LOW (ref 36.0–46.0)
Hemoglobin: 10.2 g/dL — ABNORMAL LOW (ref 12.0–15.0)
MCH: 25.4 pg — ABNORMAL LOW (ref 26.0–34.0)
MCHC: 29 g/dL — ABNORMAL LOW (ref 30.0–36.0)
MCV: 87.8 fL (ref 80.0–100.0)
Platelets: 367 10*3/uL (ref 150–400)
RBC: 4.01 MIL/uL (ref 3.87–5.11)
RDW: 18.2 % — ABNORMAL HIGH (ref 11.5–15.5)
WBC: 5.3 10*3/uL (ref 4.0–10.5)
nRBC: 0 % (ref 0.0–0.2)

## 2019-04-26 LAB — MAGNESIUM: Magnesium: 1.9 mg/dL (ref 1.7–2.4)

## 2019-04-26 NOTE — Plan of Care (Signed)
  Problem: Acute Rehab PT Goals(only PT should resolve) Goal: Pt Will Go Supine/Side To Sit Outcome: Progressing Flowsheets (Taken 04/26/2019 1621) Pt will go Supine/Side to Sit: Independently Goal: Patient Will Transfer Sit To/From Stand Outcome: Progressing Flowsheets (Taken 04/26/2019 1621) Patient will transfer sit to/from stand: with modified independence Goal: Pt Will Transfer Bed To Chair/Chair To Bed Outcome: Progressing Flowsheets (Taken 04/26/2019 1621) Pt will Transfer Bed to Chair/Chair to Bed: with modified independence Goal: Pt Will Ambulate Outcome: Progressing Flowsheets (Taken 04/26/2019 1621) Pt will Ambulate:  > 125 feet  with modified independence

## 2019-04-26 NOTE — Progress Notes (Signed)
PROGRESS NOTE    Shaquila Sigman  PJA:[id number] DOB: 02/28/1971 DOA: 04/23/2019 PCP: [name], MD   Brief Narrative:  Brittany Rodgers is a 48 y.o. female with medical history significant of metastatic breast cancer s/p mastectomy presents to ED with complaints of increased sob and lethargy. Of note, pt is tired appearing and consequently a poor historian at this time. Per chart review, pt was seen recently in Oncology clinic on 7/28, where pt was noted to have new chest wall mass as well as liver mass. Pt was being considered for possible bipopsy. On day of admit, pt noted to be increasingly sob with tachycardia and was subseqently referred to ED from Oncology clinic.  ED Course: In the ED, pt was noted to have HR in excess of 140's, ultimately improved to the 110's with one dose of IV metoprolol. Pt underwent CT chest with findings worrisome for large R sided effusion. Pt remained on minimal O2 support. IR was consulted by EDP and hospitalist consulted for consideration for admission.  Assessment & Plan:   Principal Problem:   Pleural effusion Active Problems:   IBS   Hyperlipidemia   Chronic right hip pain   Malignant neoplasm of lower-outer quadrant of right breast of female, estrogen receptor negative (HCC)   S/P mastectomy   Pleural effusion on right  # SIRS with concern for sepsis  Presyncope: fever, tachycardia, concerning for infection/sepsis.  Will bolus and start on vanc/cefepime.  She's currently on RA without tachypnea and has a normal WBC count. - Could be related to pleural effusion? Vs UTI with UA with >50 WBC and large LE. - urine cx with multiple species - blood cx NGTD, body fluid culture NG - continue vanc and cefepime -> will narrow to cefepime.  Continue to monitor. - continue to monitor, appears to be improving  1. Malignant Pleural Effusion 1. Currently on minimal O2 support 2. CT with moderate to large R pleural effusion with associated  atelectasis or consolidation 3. Now s/p thoracentesis 4. Follow culture, gram stain without organism.  Follow cytology (with malignant cells consistent with metastatic adenocarcinoma).  Acid fast smear and culture pending.  Exudative by lights. 5.   CXR 7/31 with large R pleural effusion with consolidation of R mid and lower lung.  Pt will likely benefit from pleurx during this admission.  Will continue to monitor for now in setting of fever above.  2. Stage IV Breast cancer, s/p mastectomy 1. Followed by [name] 2. New chest wall mass and liver lesion noted recently 3. Pt also has mutliple small bilateral pulm nodules, new or enlarged from prior exam concerning for metastatic disease (vs infection) 4. Appreciate 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 Continue pain medications 6. Appreciate palliative care for assistance with [facility] conversations  3. HLD 1. Seems stable. 2. Cont statin per home regimen  4. Sinus tach 1. Hr improved today, still mildly tachy, but better 2. Continue metoprolol  5. Hx IBS 1. Seems stable at this time  6. Chronic pain 1. Will continue home pain regimen -> now adjusted to include MSIR and MS contin  DVT prophylaxis: lovenox Code Status: full  Family Communication: discussed with husband Disposition Plan: pending further w/u and imrpovement   Consultants:   IR  Oncology  Palliative care  Procedures:  CT guided core bx R anterior  chest wall mass 7/30 US thoracentesis 7/30  Antimicrobials:  Anti-infectives (From admission, onward)   Start     Dose/Rate Route Frequency Ordered Stop   04/25/19 0400  vancomycin (VANCOCIN) IVPB 750 mg/150 ml premix  Status:  Discontinued     750 mg 150 mL/hr over 60 Minutes Intravenous Every 12 hours 04/24/19 1332 04/25/19 1447   04/24/19 2200  ceFEPIme  (MAXIPIME) 2 g in sodium chloride 0.9 % 100 mL IVPB     2 g 200 mL/hr over 30 Minutes Intravenous Every 8 hours 04/24/19 1309     04/24/19 1315  ceFEPIme (MAXIPIME) 2 g in sodium chloride 0.9 % 100 mL IVPB     2 g 200 mL/hr over 30 Minutes Intravenous STAT 04/24/19 1307 04/24/19 1536   04/24/19 1315  vancomycin (VANCOCIN) 1,750 mg in sodium chloride 0.9 % 500 mL IVPB     1,750 mg 250 mL/hr over 120 Minutes Intravenous STAT 04/24/19 1307 04/24/19 1915     Subjective: Discussed diagnosis. Pt appropriately upset Will return later to discuss additionally  Objective: Vitals:   04/25/19 2335 04/26/19 0202 04/26/19 0503 04/26/19 1133  BP: (!) 143/87 128/80 130/69 136/90  Pulse: (!) 103 93 93 (!) 102  Resp: 16 16 16 18   Temp: 98.2 F (36.8 C) (!) 97.3 F (36.3 C) 98.1 F (36.7 C) 98 F (36.7 C)  TempSrc: Oral Oral Oral Oral  SpO2: 100% 99% 95% 91%  Weight:      Height:        Intake/Output Summary (Last 24 hours) at 04/26/2019 1442 Last data filed at 04/26/2019 1300 Gross per 24 hour  Intake 920.54 ml  Output 3 ml  Net 917.54 ml   Filed Weights   04/24/19 0003  Weight: 78.5 kg    Examination:  General: No acute distress. Cardiovascular: Heart sounds show a regular rate, and rhythm.  Lungs: Clear to auscultation bilaterally  Abdomen: Soft, nontender, nondistended Neurological: Alert and oriented 3. Moves all extremities 4. Cranial nerves II through XII grossly intact. Skin: Warm and dry. No rashes or lesions. Extremities: No clubbing or cyanosis. No edema.   Data Reviewed: I have personally reviewed following labs and imaging studies  CBC: Recent Labs  Lab 04/23/19 0834 04/23/19 1309 04/24/19 0453 04/25/19 0230 04/26/19 0449  WBC 9.7 10.1 7.5 6.8 5.3  NEUTROABS 7.4 7.2  --   --   --   HGB 11.3* 10.8* 9.4* 9.0* 10.2*  HCT 37.8 35.6* 32.4* 30.1* 35.2*  MCV 86.3 87.5 88.8 89.3 87.8  PLT 383 364 319 298 740   Basic Metabolic Panel: Recent Labs  Lab 04/23/19  0834 04/23/19 1309 04/24/19 0453 04/25/19 0230 04/26/19 0449  NA 142 139 140 140 140  K 3.8 3.5 3.9 3.8 4.0  CL 102 101 104 104 103  CO2 28 27 27 26 28   GLUCOSE 155* 126* 107* 121* 104*  BUN 9 8 10 8 8   CREATININE 0.93 0.62 0.68 0.65 0.69  CALCIUM 10.9* 9.9 9.4 8.7* 9.7  MG  --   --   --  1.6* 1.9   GFR: Estimated Creatinine Clearance: 82.6 mL/min (by C-G formula based on SCr of 0.69 mg/dL). Liver Function Tests: Recent Labs  Lab 04/23/19 0834 04/23/19 1309 04/24/19 0453 04/25/19 0230 04/26/19 0449  AST 9* 12* 10* 11* 9*  ALT 39 36 28 24 22   ALKPHOS 128* 113 95 83 85  BILITOT 0.3 0.3 0.4 0.3 0.6  PROT 7.6 7.2 6.4* 5.7* 6.8  ALBUMIN 3.6 3.6 3.1* 2.7* 3.1*   No results for input(s): LIPASE, AMYLASE in the last 168 hours. No results for input(s): AMMONIA in the last 168 hours. Coagulation Profile: No results for input(s): INR, PROTIME in the last 168 hours. Cardiac Enzymes: No results for input(s): CKTOTAL, CKMB, CKMBINDEX, TROPONINI in the last 168 hours. BNP (last 3 results) No results for input(s): PROBNP in the last 8760 hours. HbA1C: No results for input(s): HGBA1C in the last 72 hours. CBG: No results for input(s): GLUCAP in the last 168 hours. Lipid Profile: No results for input(s): CHOL, HDL, LDLCALC, TRIG, CHOLHDL, LDLDIRECT in the last 72 hours. Thyroid Function Tests: No results for input(s): TSH, T4TOTAL, FREET4, T3FREE, THYROIDAB in the last 72 hours. Anemia Panel: No results for input(s): VITAMINB12, FOLATE, FERRITIN, TIBC, IRON, RETICCTPCT in the last 72 hours. Sepsis Labs: Recent Labs  Lab 04/24/19 1650 04/24/19 1932 04/25/19 0230 04/26/19 0449  PROCALCITON  --   --  <0.10 <0.10  LATICACIDVEN 2.1* 1.9  --   --     Recent Results (from the past 240 hour(s))  SARS Coronavirus 2 (CEPHEID - Performed in [place] hospital lab), Hosp Order     Status: None   Collection Time: 04/23/19  1:06 PM   Specimen: Nasopharyngeal Swab  Result Value Ref  Range Status   SARS Coronavirus 2 NEGATIVE NEGATIVE Final    Comment: (NOTE) If result is NEGATIVE SARS-CoV-2 target nucleic acids are NOT DETECTED. The SARS-CoV-2 RNA is generally detectable in upper and lower  respiratory specimens during the acute phase of infection. The lowest  concentration of SARS-CoV-2 viral copies this assay can detect is 250  copies / mL. A negative result does not preclude SARS-CoV-2 infection  and should not be used as the sole basis for treatment or other  patient management decisions.  A negative result may occur with  improper specimen collection / handling, submission of specimen other  than nasopharyngeal swab, presence of viral mutation(s) within the  areas targeted by this assay, and inadequate number of viral copies  (<250 copies / mL). A negative result must be combined with clinical  observations, patient history, and epidemiological information. If result is POSITIVE SARS-CoV-2 target nucleic acids are DETECTED. The SARS-CoV-2 RNA is generally detectable in upper and lower  respiratory specimens dur ing the acute phase of infection.  Positive  results are indicative of active infection with SARS-CoV-2.  Clinical  correlation with patient history and other diagnostic information is  necessary to determine patient infection status.  Positive results do  not rule out bacterial infection or co-infection with other viruses. If result is PRESUMPTIVE POSTIVE SARS-CoV-2 nucleic acids MAY BE PRESENT.   A presumptive positive result was obtained on the submitted specimen  and confirmed on repeat testing.  While 2019 novel coronavirus  (SARS-CoV-2) nucleic acids may be present in the submitted sample  additional confirmatory testing may be necessary for epidemiological  and / or clinical management purposes  to differentiate between  SARS-CoV-2 and other Sarbecovirus currently known to infect humans.  If clinically indicated additional testing with an  alternate test  methodology [phone]) is advised. The SARS-CoV-2 RNA is generally  detectable in upper and lower respiratory sp ecimens during the acute  phase of infection. The expected result is Negative. Fact Sheet for Patients:  StrictlyIdeas.no Fact Sheet for Healthcare Providers: BankingDealers.co.za This test is not yet approved or cleared by the [place] FDA and has been authorized for detection and/or diagnosis of  SARS-CoV-2 by FDA under an Emergency Use Authorization (EUA).  This EUA will remain in effect (meaning this test can be used) for the duration of the COVID-19 declaration under Section 564(b)(1) of the Act, 21 U.S.C. section 360bbb-3(b)(1), unless the authorization is terminated or revoked sooner. Performed at [facility], [place] [address] [place], [place] [address]   Acid Fast Smear (AFB)     Status: None   Collection Time: 04/24/19 10:37 AM   Specimen: Pleural, Right; Pleural Fluid  Result Value Ref Range Status   AFB Specimen Processing Concentration  Final   Acid Fast Smear Negative  Final    Comment: (NOTE) Performed At: [facility] [place], [place] [id number] Rush Farmer MD PR:[id number]    Source (AFB) PLEURAL  Final    Comment: RT Performed at [facility], [place] [address] [place], [place] [address]   Culture, body fluid-bottle     Status: None (Preliminary result)   Collection Time: 04/24/19 10:37 AM   Specimen: Pleura  Result Value Ref Range Status   Specimen Description PLEURAL RIGHT  Final   Special Requests NONE  Final   Culture   Final    NO GROWTH 2 DAYS Performed at [place] [address] [place], [place] [address]    Report Status PENDING  Incomplete  Gram stain     Status: None   Collection Time: 04/24/19 10:37 AM   Specimen: Pleura  Result Value Ref Range Status   Specimen Description PLEURAL RIGHT   Final   Special Requests NONE  Final   Gram Stain   Final    FEW WBC PRESENT, PREDOMINANTLY MONONUCLEAR NO ORGANISMS SEEN Performed at [facility], [address], [place], [place] [address]    Report Status 04/24/2019 FINAL  Final  Culture, blood (routine x 2)     Status: None (Preliminary result)   Collection Time: 04/24/19  1:35 PM   Specimen: BLOOD LEFT HAND  Result Value Ref Range Status   Specimen Description   Final    BLOOD LEFT HAND Performed at [place] [address] [place], [place] [address]    Special Requests   Final    BOTTLES DRAWN AEROBIC ONLY Blood Culture adequate volume   Culture   Final    NO GROWTH 2 DAYS Performed at [facility], [place] [address], [place], [place] [address]    Report Status PENDING  Incomplete  Culture, blood (routine x 2)     Status: None (Preliminary result)   Collection Time: 04/24/19  1:35 PM   Specimen: BLOOD LEFT HAND  Result Value Ref Range Status   Specimen Description   Final    BLOOD LEFT HAND Performed at [place] [address], [place], [place] [address]    Special Requests   Final    BOTTLES DRAWN AEROBIC ONLY Blood Culture results may not be optimal due to an inadequate volume of blood received in culture bottles Performed at [place] [address] [place], [place] [address]    Culture   Final    NO GROWTH 2 DAYS Performed at [place] [address], [place],  [address]    Report Status PENDING  Incomplete  Culture, Urine     Status: Abnormal   Collection Time: 04/24/19  4:50 PM   Specimen: Urine, Random  Result Value Ref Range Status   Specimen  Description   Final    URINE, RANDOM Performed at [facility], [place] [address] [place], [place] [address]    Special Requests   Final    NONE Performed at [facility], [place] [address] [place], [place] [address]    Culture  MULTIPLE SPECIES PRESENT, SUGGEST RECOLLECTION (A)  Final   Report Status 04/25/2019 FINAL  Final         Radiology Studies: Dg Chest Port 1 View  Result Date: 04/25/2019 CLINICAL DATA:  Fever EXAM: PORTABLE CHEST 1 VIEW COMPARISON:  April 23, 2019 FINDINGS: The mediastinal contour and cardiac silhouette are stable. Right central venous line is unchanged. There is a large right pleural effusion with consolidation of the right mid and lower lung worsened compared prior exam. The left lung is clear. The bony structures are stable. IMPRESSION: Large right pleural effusion with consolidation of the right mid and lower lung worse compared to prior exam. Electronically Signed   By: Abelardo Diesel M.D.   On: 04/25/2019 19:49        Scheduled Meds: . atorvastatin  20 mg Oral Daily  . Chlorhexidine Gluconate Cloth  6 each Topical Daily  . docusate sodium  200 mg Oral BID  . enoxaparin (LOVENOX) injection  40 mg Subcutaneous Q24H  . metoprolol tartrate  25 mg Oral BID  . morphine  15 mg Oral Q12H  . polyethylene glycol  17 g Oral BID  . senna-docusate  2 tablet Oral BID   Continuous Infusions: . sodium chloride Stopped (04/25/19 2305)  . ceFEPime (MAXIPIME) IV 2 g (04/26/19 1402)     LOS: 3 days    Time spent: over 30 min    Fayrene Helper, MD [facility] Pager AMION  If 7PM-7AM, please contact night-coverage [email or web address] Password [email or web address] 04/26/2019, 2:42 PM

## 2019-04-26 NOTE — Evaluation (Signed)
Physical Therapy Evaluation Patient Details Name: Brittany Rodgers MRN: [id number] DOB: [birth year] Today's Date: 04/26/2019   History of Present Illness  Brittany Rodgers is a 48 y.o. female with medical history significant of metastatic breast cancer s/p mastectomy. She presented to ED on 04/23/19 with complaints of increased sob and lethargy.    Clinical Impression  Brittany Rodgers is a 48 y.o. female admitted for above diagnosis and HPI. She is currently limited by functional impairments (see below PT problem list). She was able to perform bed mobility at mod independent level and required min guard to assist for transfers and gait with single UE support. Patient was limited with gait by SOB and fatigue and was educated on benefits of mobilizing to improve activity tolerance. Acute PT will follow and pt will benefit form continue OP PT services to address ongoing  impairments related to mastectomy on 03/04/19.    Follow Up Recommendations Outpatient PT(pt recieving OP PT services and wishes to continue for ROM for shoulders)    Equipment Recommendations  None recommended by PT    Recommendations for Other Services       Precautions / Restrictions Precautions Precautions: Fall Restrictions Weight Bearing Restrictions: No      Mobility  Bed Mobility Overal bed mobility: Needs Assistance Bed Mobility: Supine to Sit     Supine to sit: Supervision     General bed mobility comments: pt required cues for sequencing supine to sit transfer and stated, "I can't do that" but was then able to demonstrate her scoot and press up that she does at home if she is in flat position, no physical assistance required  Transfers Overall transfer level: Needs assistance Equipment used: 1 person hand held assist Transfers: Sit to/from Stand Sit to Stand: Min assist         General transfer comment: Pt requries min assist with 1 HHA and cues to initiate rise and stabilize in standing. VCs  for safety, hand placement.  Ambulation/Gait Ambulation/Gait assistance: Min guard Gait Distance (Feet): 140 Feet Assistive device: IV Pole Gait Pattern/deviations: Step-through pattern;Decreased stride length Gait velocity: slow   General Gait Details: pt able to take a few step in room wtih no device but unsteady gait and reaching for support, pt stable with IV pole to mobilize in room and hallway requiring min guard assist and cues for safe management of IV pole. pt required cues to take standing rest break  Stairs            Wheelchair Mobility    Modified Rankin (Stroke Patients Only)       Balance Overall balance assessment: Needs assistance Sitting-balance support: No upper extremity supported;Feet supported Sitting balance-Leahy Scale: Good     Standing balance support: Bilateral upper extremity supported;During functional activity;Single extremity supported Standing balance-Leahy Scale: Fair Standing balance comment: pt initially looking for bil UE support on IV pole and cued to reduce to single UE support, pt able to maintain balance with single UE. Pt with increased unsteadiness without support                             Pertinent Vitals/Pain Pain Assessment: 0-10 Pain Score: 4  Pain Location: Rt chest Pain Descriptors / Indicators: Guarding;Discomfort;Sore Pain Intervention(s): Limited activity within patient's tolerance;Monitored during session;Premedicated before session    Brittany Rodgers expects to be discharged to:: Private residence Living Arrangements: Spouse/significant other Available Help at Discharge: Family;Available PRN/intermittently Type  of Home: House Home Access: Stairs to enter Entrance Stairs-Rails: Right Entrance Stairs-Number of Steps: 3 Home Layout: One level Home Equipment: Clinical cytogeneticist - 2 wheels;Cane - single point;Grab bars - tub/shower Additional Comments: pt reports he husband assists her with  ADL's including bathing and dressing    Prior Function Level of Independence: Independent         Comments: pt has has been recieving outpatient PT for ROM following her recent surgery (mestectomy on 03/04/19)     Hand Dominance   Dominant Hand: Right    Extremity/Trunk Assessment   Upper Extremity Assessment Upper Extremity Assessment: RUE deficits/detail RUE Deficits / Details: restricted R UE RUE: Unable to fully assess due to pain    Lower Extremity Assessment Lower Extremity Assessment: Overall WFL for tasks assessed    Cervical / Trunk Assessment Cervical / Trunk Assessment: Normal  Communication   Communication: No difficulties  Cognition Arousal/Alertness: Awake/alert Behavior During Therapy: WFL for tasks assessed/performed Overall Cognitive Status: Within Functional Limits for tasks assessed                      Assessment/Plan    PT Assessment Patient needs continued PT services  PT Problem List Decreased strength;Decreased mobility;Decreased activity tolerance;Decreased balance;Decreased knowledge of use of DME;Pain       PT Treatment Interventions DME instruction;Gait training;Therapeutic exercise;Therapeutic activities;Patient/family education;Balance training;Functional mobility training;Stair training    PT Goals (Current goals can be found in the Care Plan section)  Acute Rehab PT Goals Patient Stated Goal: no specific goals stated by pt, she wants to continue OPPT PT Goal Formulation: With patient Time For Goal Achievement: 05/03/19 Potential to Achieve Goals: Good    Frequency Min 3X/week    AM-PAC PT "6 Clicks" Mobility  Outcome Measure Help needed turning from your back to your side while in a flat bed without using bedrails?: A Little Help needed moving from lying on your back to sitting on the side of a flat bed without using bedrails?: A Little Help needed moving to and from a bed to a chair (including a wheelchair)?: A  Little Help needed standing up from a chair using your arms (e.g., wheelchair or bedside chair)?: A Little Help needed to walk in hospital room?: A Little Help needed climbing 3-5 steps with a railing? : A Little 6 Click Score: 18    End of Session Equipment Utilized During Treatment: Gait belt Activity Tolerance: Patient tolerated treatment well Patient left: with call bell/phone within reach;in chair;Other (comment)(chair pad alarm in place, RN aware it is not on as pt is aware to call for assistance, personal items on tray table next to patient) Nurse Communication: Mobility status PT Visit Diagnosis: Muscle weakness (generalized) (M62.81);Difficulty in walking, not elsewhere classified (R26.2)    Time: 2542-7062 PT Time Calculation (min) (ACUTE ONLY): 30 min   Charges:   PT Evaluation $PT Eval Low Complexity: 1 Low PT Treatments $Gait Training: 8-22 mins        Kipp Brood, PT, DPT, [facility] Physical Therapist with [facility]  04/26/2019 4:16 PM

## 2019-04-27 LAB — CBC
HCT: 32.3 % — ABNORMAL LOW (ref 36.0–46.0)
Hemoglobin: 9.4 g/dL — ABNORMAL LOW (ref 12.0–15.0)
MCH: 25.6 pg — ABNORMAL LOW (ref 26.0–34.0)
MCHC: 29.1 g/dL — ABNORMAL LOW (ref 30.0–36.0)
MCV: 88 fL (ref 80.0–100.0)
Platelets: 405 10*3/uL — ABNORMAL HIGH (ref 150–400)
RBC: 3.67 MIL/uL — ABNORMAL LOW (ref 3.87–5.11)
RDW: 18.4 % — ABNORMAL HIGH (ref 11.5–15.5)
WBC: 6 10*3/uL (ref 4.0–10.5)
nRBC: 0 % (ref 0.0–0.2)

## 2019-04-27 LAB — COMPREHENSIVE METABOLIC PANEL
ALT: 19 U/L (ref 0–44)
AST: 9 U/L — ABNORMAL LOW (ref 15–41)
Albumin: 2.9 g/dL — ABNORMAL LOW (ref 3.5–5.0)
Alkaline Phosphatase: 88 U/L (ref 38–126)
Anion gap: 10 (ref 5–15)
BUN: 8 mg/dL (ref 6–20)
CO2: 26 mmol/L (ref 22–32)
Calcium: 9.7 mg/dL (ref 8.9–10.3)
Chloride: 105 mmol/L (ref 98–111)
Creatinine, Ser: 0.72 mg/dL (ref 0.44–1.00)
GFR calc Af Amer: >60 mL/min (ref >60)
GFR calc non Af Amer: >60 mL/min (ref >60)
Glucose, Bld: 105 mg/dL — ABNORMAL HIGH (ref 70–99)
Potassium: 3.9 mmol/L (ref 3.5–5.1)
Sodium: 141 mmol/L (ref 135–145)
Total Bilirubin: 0.3 mg/dL (ref 0.3–1.2)
Total Protein: 6.6 g/dL (ref 6.5–8.1)

## 2019-04-27 LAB — MAGNESIUM: Magnesium: 1.9 mg/dL (ref 1.7–2.4)

## 2019-04-27 MED ORDER — BENZONATATE 100 MG PO CAPS
100.0000 mg | ORAL_CAPSULE | Freq: Three times a day (TID) | ORAL | Status: DC | PRN
  Administered 2019-04-27 – 2019-05-06 (×8): 100 mg via ORAL
  Filled 2019-04-27 (×10): qty 1

## 2019-04-27 NOTE — Progress Notes (Signed)
PROGRESS NOTE    Brittany Rodgers  ONG:[id number] DOB: 02-28-1971 DOA: 04/23/2019 PCP: [name], MD   Brief Narrative:  Brittany Rodgers is Brittany Rodgers 48 y.o. female with medical history significant of metastatic breast cancer s/p mastectomy presents to ED with complaints of increased sob and lethargy. Of note, pt is tired appearing and consequently [place] poor historian at this time. Per chart review, pt was seen recently in Oncology clinic on 7/28, where pt was noted to have new chest wall mass as well as liver mass. Pt was being considered for possible bipopsy. On day of admit, pt noted to be increasingly sob with tachycardia and was subseqently referred to ED from Oncology clinic.  ED Course: In the ED, pt was noted to have HR in excess of 140's, ultimately improved to the 110's with one dose of IV metoprolol. Pt underwent CT chest with findings worrisome for large R sided effusion. Pt remained on minimal O2 support. IR was consulted by EDP and hospitalist consulted for consideration for admission.  Assessment & Plan:   Principal Problem:   Pleural effusion Active Problems:   IBS   Hyperlipidemia   Chronic right hip pain   Malignant neoplasm of lower-outer quadrant of right breast of female, estrogen receptor negative (HCC)   S/P mastectomy   Pleural effusion on right  # SIRS with concern for sepsis  Presyncope: fever, tachycardia, concerning for infection/sepsis.  Will bolus and start on vanc/cefepime.  She's currently on RA without tachypnea and has Terrie Haring normal WBC count. - Could be related to pleural effusion? Vs UTI with UA with >50 WBC and large LE. - urine cx with multiple species - blood cx NGTD, body fluid culture NG - continue vanc and cefepime -> will narrow to cefepime.  Continue to monitor.  Last fever 7/31 PM.  WBC normal.  - continue to monitor, appears to be improving  1. Malignant Pleural Effusion 1. Currently on minimal O2 support 2. CT with moderate to large R pleural  effusion with associated atelectasis or consolidation 3. Now s/p thoracentesis 4. Follow culture, gram stain without organism.  Follow cytology (with malignant cells consistent with metastatic adenocarcinoma).  Acid fast smear and culture pending.  Exudative by lights. 5.   CXR 7/31 with large R pleural effusion with consolidation of R mid and lower lung.  Pt will likely benefit from pleurx during this admission -> she prefers to talk to [name] before we do this.  Will discuss again tomorrow.  2. Stage IV Breast cancer, s/p mastectomy 1. Followed by [name] 2. New chest wall mass and liver lesion noted recently 3. Pt also has mutliple small bilateral pulm nodules, new or enlarged from prior exam concerning for metastatic disease (vs infection) 4. Appreciate 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 Continue pain medications 6. Appreciate palliative care for assistance with Muse conversations  3. HLD 1. Seems stable. 2. Cont statin per home regimen  4. Sinus tach 1. Hr improved today, still mildly tachy, but better 2. Continue metoprolol  5. Hx IBS 1. Seems stable at this time  6. Chronic pain 1. Will continue home pain regimen -> now adjusted to include MSIR and MS contin  DVT prophylaxis: lovenox Code Status: full  Family Communication: discussed with husband 7/31 - attempting to allow husband to bedside today.  She's planning to  discuss diagnosis tomorrow with him. Disposition Plan: pending further w/u and imrpovement   Consultants:   IR  Oncology  Palliative care  Procedures:  CT guided core bx R anterior chest wall mass 7/30 US thoracentesis 7/30  Antimicrobials:  Anti-infectives (From admission, onward)   Start     Dose/Rate Route Frequency Ordered Stop   04/25/19 0400  vancomycin (VANCOCIN) IVPB 750  mg/150 ml premix  Status:  Discontinued     750 mg 150 mL/hr over 60 Minutes Intravenous Every 12 hours 04/24/19 1332 04/25/19 1447   04/24/19 2200  ceFEPIme (MAXIPIME) 2 g in sodium chloride 0.9 % 100 mL IVPB     2 g 200 mL/hr over 30 Minutes Intravenous Every 8 hours 04/24/19 1309     04/24/19 1315  ceFEPIme (MAXIPIME) 2 g in sodium chloride 0.9 % 100 mL IVPB     2 g 200 mL/hr over 30 Minutes Intravenous STAT 04/24/19 1307 04/24/19 1536   04/24/19 1315  vancomycin (VANCOCIN) 1,750 mg in sodium chloride 0.9 % 500 mL IVPB     1,750 mg 250 mL/hr over 120 Minutes Intravenous STAT 04/24/19 1307 04/24/19 1915     Subjective: Concerned about how she's going to discuss dx with her husband  Objective: Vitals:   04/26/19 2322 04/27/19 0546 04/27/19 1000 04/27/19 1253  BP: 140/88 135/89 128/84 (!) 140/91  Pulse: (!) 117 (!) 108 (!) 113 (!) 107  Resp: 18 18 20 20   Temp: 98.4 F (36.9 C) 98.5 F (36.9 C)  98.3 F (36.8 C)  TempSrc: Oral Oral  Oral  SpO2: 93% 93% 96% 96%  Weight:      Height:        Intake/Output Summary (Last 24 hours) at 04/27/2019 1422 Last data filed at 04/27/2019 1300 Gross per 24 hour  Intake 1060 ml  Output 3 ml  Net 1057 ml   Filed Weights   04/24/19 0003  Weight: 78.5 kg    Examination:  General: No acute distress. Cardiovascular: Heart sounds show Calin Ellery regular rate, and rhythm.  Lungs: Clear to auscultation bilaterally  Abdomen: Soft, nontender, nondistended  Neurological: Alert and oriented 3. Moves all extremities 4. Cranial nerves II through XII grossly intact. Skin: Warm and dry. No rashes or lesions. Extremities: No clubbing or cyanosis. No edema.  Data Reviewed: I have personally reviewed following labs and imaging studies  CBC: Recent Labs  Lab 04/23/19 0834 04/23/19 1309 04/24/19 0453 04/25/19 0230 04/26/19 0449 04/27/19 0414  WBC 9.7 10.1 7.5 6.8 5.3 6.0  NEUTROABS 7.4 7.2  --   --   --   --   HGB 11.3* 10.8* 9.4* 9.0* 10.2* 9.4*   HCT 37.8 35.6* 32.4* 30.1* 35.2* 32.3*  MCV 86.3 87.5 88.8 89.3 87.8 88.0  PLT 383 364 319 298 367 353*   Basic Metabolic Panel: Recent Labs  Lab 04/23/19 1309 04/24/19 0453 04/25/19 0230 04/26/19 0449 04/27/19 0414  NA 139 140 140 140 141  K 3.5 3.9 3.8 4.0 3.9  CL 101 104 104 103 105  CO2 27 27 26 28 26   GLUCOSE 126* 107* 121* 104* 105*  BUN 8 10 8 8 8   CREATININE 0.62 0.68 0.65 0.69 0.72  CALCIUM 9.9 9.4 8.7* 9.7 9.7  MG  --   --  1.6* 1.9 1.9   GFR: Estimated Creatinine Clearance: 82.6 mL/min (by C-G formula based on SCr of 0.72 mg/dL). Liver Function Tests: Recent Labs  Lab 04/23/19 1309 04/24/19 0453 04/25/19 0230 04/26/19 6144  04/27/19 0414  AST 12* 10* 11* 9* 9*  ALT 36 28 24 22 19   ALKPHOS 113 95 83 85 88  BILITOT 0.3 0.4 0.3 0.6 0.3  PROT 7.2 6.4* 5.7* 6.8 6.6  ALBUMIN 3.6 3.1* 2.7* 3.1* 2.9*   No results for input(s): LIPASE, AMYLASE in the last 168 hours. No results for input(s): AMMONIA in the last 168 hours. Coagulation Profile: No results for input(s): INR, PROTIME in the last 168 hours. Cardiac Enzymes: No results for input(s): CKTOTAL, CKMB, CKMBINDEX, TROPONINI in the last 168 hours. BNP (last 3 results) No results for input(s): PROBNP in the last 8760 hours. HbA1C: No results for input(s): HGBA1C in the last 72 hours. CBG: No results for input(s): GLUCAP in the last 168 hours. Lipid Profile: No results for input(s): CHOL, HDL, LDLCALC, TRIG, CHOLHDL, LDLDIRECT in the last 72 hours. Thyroid Function Tests: No results for input(s): TSH, T4TOTAL, FREET4, T3FREE, THYROIDAB in the last 72 hours. Anemia Panel: No results for input(s): VITAMINB12, FOLATE, FERRITIN, TIBC, IRON, RETICCTPCT in the last 72 hours. Sepsis Labs: Recent Labs  Lab 04/24/19 1650 04/24/19 1932 04/25/19 0230 04/26/19 0449  PROCALCITON  --   --  <0.10 <0.10  LATICACIDVEN 2.1* 1.9  --   --     Recent Results (from the past 240 hour(s))  SARS Coronavirus 2 (CEPHEID -  Performed in [place] hospital lab), Hosp Order     Status: None   Collection Time: 04/23/19  1:06 PM   Specimen: Nasopharyngeal Swab  Result Value Ref Range Status   SARS Coronavirus 2 NEGATIVE NEGATIVE Final    Comment: (NOTE) If result is NEGATIVE SARS-CoV-2 target nucleic acids are NOT DETECTED. The SARS-CoV-2 RNA is generally detectable in upper and lower  respiratory specimens during the acute phase of infection. The lowest  concentration of SARS-CoV-2 viral copies this assay can detect is 250  copies / mL. Inika Bellanger negative result does not preclude SARS-CoV-2 infection  and should not be used as the sole basis for treatment or other  patient management decisions.  Arlyce Circle negative result may occur with  improper specimen collection / handling, submission of specimen other  than nasopharyngeal swab, presence of viral mutation(s) within the  areas targeted by this assay, and inadequate number of viral copies  (<250 copies / mL). Peterson Mathey negative result must be combined with clinical  observations, patient history, and epidemiological information. If result is POSITIVE SARS-CoV-2 target nucleic acids are DETECTED. The SARS-CoV-2 RNA is generally detectable in upper and lower  respiratory specimens dur ing the acute phase of infection.  Positive  results are indicative of active infection with SARS-CoV-2.  Clinical  correlation with patient history and other diagnostic information is  necessary to determine patient infection status.  Positive results do  not rule out bacterial infection or co-infection with other viruses. If result is PRESUMPTIVE POSTIVE SARS-CoV-2 nucleic acids MAY BE PRESENT.   Zaiyah Sottile presumptive positive result was obtained on the submitted specimen  and confirmed on repeat testing.  While 2019 novel coronavirus  (SARS-CoV-2) nucleic acids may be present in the submitted sample  additional confirmatory testing may be necessary for epidemiological  and / or clinical management  purposes  to differentiate between  SARS-CoV-2 and other Sarbecovirus currently known to infect humans.  If clinically indicated additional testing with an alternate test  methodology [phone]) is advised. The SARS-CoV-2 RNA is generally  detectable in upper and lower respiratory sp ecimens during the acute  phase of infection. The  expected result is Negative. Fact Sheet for Patients:  StrictlyIdeas.no Fact Sheet for Healthcare Providers: BankingDealers.co.za This test is not yet approved or cleared by the [place] FDA and has been authorized for detection and/or diagnosis of SARS-CoV-2 by FDA under an Emergency Use Authorization (EUA).  This EUA will remain in effect (meaning this test can be used) for the duration of the COVID-19 declaration under Section 564(b)(1) of the Act, 21 U.S.C. section 360bbb-3(b)(1), unless the authorization is terminated or revoked sooner. Performed at [facility], [place] [address] [place], [place] [address]   Acid Fast Smear (AFB)     Status: None   Collection Time: 04/24/19 10:37 AM   Specimen: Pleural, Right; Pleural Fluid  Result Value Ref Range Status   AFB Specimen Processing Concentration  Final   Acid Fast Smear Negative  Final    Comment: (NOTE) Performed At: [facility] [place], [place] [id number] Rush Farmer MD ZL:[id number]    Source (AFB) PLEURAL  Final    Comment: RT Performed at [facility], [place] [address] [place], [place] [address]   Culture, body fluid-bottle     Status: None (Preliminary result)   Collection Time: 04/24/19 10:37 AM   Specimen: Pleura  Result Value Ref Range Status   Specimen Description PLEURAL RIGHT  Final   Special Requests NONE  Final   Culture   Final    NO GROWTH 3 DAYS Performed at [place] [address] [place], [place] [address]    Report Status PENDING  Incomplete   Gram stain     Status: None   Collection Time: 04/24/19 10:37 AM   Specimen: Pleura  Result Value Ref Range Status   Specimen Description PLEURAL RIGHT  Final   Special Requests NONE  Final   Gram Stain   Final    FEW WBC PRESENT, PREDOMINANTLY MONONUCLEAR NO ORGANISMS SEEN Performed at [facility], [address] [place], [place] [address]    Report Status 04/24/2019 FINAL  Final  Culture, blood (routine x 2)     Status: None (Preliminary result)   Collection Time: 04/24/19  1:35 PM   Specimen: BLOOD LEFT HAND  Result Value Ref Range Status   Specimen Description   Final    BLOOD LEFT HAND Performed at [place] [address] [place], [place] [address]    Special Requests   Final    BOTTLES DRAWN AEROBIC ONLY Blood Culture adequate volume   Culture   Final    NO GROWTH 3 DAYS Performed at [facility], [place] [address], [place], [place] [address]    Report Status PENDING  Incomplete  Culture, blood (routine x 2)     Status: None (Preliminary result)   Collection Time: 04/24/19  1:35 PM   Specimen: BLOOD LEFT HAND  Result Value Ref Range Status   Specimen Description   Final    BLOOD LEFT HAND Performed at [place] [address] [place], [place] [address]    Special Requests   Final    BOTTLES DRAWN AEROBIC ONLY Blood Culture results may not be optimal due to an inadequate volume of blood received in culture bottles Performed at [place] [address], [place], [place] [address]    Culture   Final    NO GROWTH 3 DAYS Performed at [facility], [place] [address], [place], [place] [address]  Report Status PENDING  Incomplete  Culture, Urine     Status: Abnormal   Collection Time: 04/24/19  4:50 PM   Specimen: Urine, Random  Result Value Ref Range Status   Specimen Description   Final    URINE, RANDOM Performed at [place] [address],  [place], [place] [address]    Special Requests   Final    NONE Performed at [facility], [place] [address] [place], [place] [address]    Culture MULTIPLE SPECIES PRESENT, SUGGEST RECOLLECTION (Dmarius Reeder)  Final   Report Status 04/25/2019 FINAL  Final         Radiology Studies: Dg Chest Port 1 View  Result Date: 04/25/2019 CLINICAL DATA:  Fever EXAM: PORTABLE CHEST 1 VIEW COMPARISON:  April 23, 2019 FINDINGS: The mediastinal contour and cardiac silhouette are stable. Right central venous line is unchanged. There is Ott Zimmerle large right pleural effusion with consolidation of the right mid and lower lung worsened compared prior exam. The left lung is clear. The bony structures are stable. IMPRESSION: Large right pleural effusion with consolidation of the right mid and lower lung worse compared to prior exam. Electronically Signed   By: Abelardo Diesel M.D.   On: 04/25/2019 19:49        Scheduled Meds: . atorvastatin  20 mg Oral Daily  . Chlorhexidine Gluconate Cloth  6 each Topical Daily  . docusate sodium  200 mg Oral BID  . enoxaparin (LOVENOX) injection  40 mg Subcutaneous Q24H  . metoprolol tartrate  25 mg Oral BID  . morphine  15 mg Oral Q12H  . polyethylene glycol  17 g Oral BID  . senna-docusate  2 tablet Oral BID   Continuous Infusions: . sodium chloride 250 mL (04/26/19 2006)  . ceFEPime (MAXIPIME) IV 2 g (04/27/19 1316)     LOS: 4 days    Time spent: over 30 min    Fayrene Helper, MD [facility] Pager AMION  If 7PM-7AM, please contact night-coverage [email or web address] Password [email or web address] 04/27/2019, 2:22 PM

## 2019-04-27 NOTE — Plan of Care (Signed)
  Problem: Activity: Goal: Risk for activity intolerance will decrease Outcome: Progressing   Problem: Nutrition: Goal: Adequate nutrition will be maintained Outcome: Progressing   Problem: Pain Managment: Goal: General experience of comfort will improve Outcome: Progressing   

## 2019-04-27 NOTE — Progress Notes (Signed)
Pharmacy Antibiotic Note  Brittany Rodgers is a 48 y.o. female admitted on 04/23/2019 with sepsis/malignant pleural effusion.  Pharmacy has been consulted for cefepime dosing. 04/27/2019 Cefepime D#4. WBC WNL, SCr WNL. AF, no positive culture data  Plan: Continue cefepime 2 gm IV q8h  Height: 5\' 2"  (157.5 cm) Weight: 173 lb (78.5 kg) IBW/kg (Calculated) : 50.1  Temp (24hrs), Avg:98.5 F (36.9 C), Min:97.7 F (36.5 C), Max:100 F (37.8 C)  Recent Labs  Lab 04/23/19 1309 04/24/19 0453 04/24/19 1650 04/24/19 1932 04/25/19 0230 04/26/19 0449 04/27/19 0414  WBC 10.1 7.5  --   --  6.8 5.3 6.0  CREATININE 0.62 0.68  --   --  0.65 0.69 0.72  LATICACIDVEN  --   --  2.1* 1.9  --   --   --     Estimated Creatinine Clearance: 82.6 mL/min (by C-G formula based on SCr of 0.72 mg/dL).    Allergies  Allergen Reactions  . Barbiturates Other (See Comments)    Keeps pt awake  . Methocarbamol Itching  . Sulfonamide Derivatives Itching   Antimicrobials this admission: 7/30 Vancomycin >>7/31 7/30 Cefepime >>  Dose adjustments this admission:-   Microbiology results: 7/29 COVID: neg 7/30 pleural fluid cx: ng3d, gram stain neg 7/30 AFB: neg 7/30 BCx: ngtd 7/30 UCx: mulp sp, suggest recollect F 7/20 MRSA PCR neg 7/31 MRSA PCR:   Thank you for allowing pharmacy to be a part of this patient's care.  Eudelia Bunch, Pharm.D [phone] 04/27/2019 11:30 AM

## 2019-04-27 NOTE — Progress Notes (Signed)
Daily Progress Note   Patient Name: Brittany Rodgers       Date: 04/27/2019 DOB: May 27, 1971  Age: 48 y.o. MRN#: [id number] Attending Physician: Elodia Florence., * Primary Care Physician: [name], MD Admit Date: 04/23/2019  Reason for Consultation/Follow-up: Establishing goals of care  Subjective: I met with Brittany Rodgers today.  She reports that pain has been better controlled on current regimen.  Requests to continue same.  She had a large BM today per her report.  Continued to gently explore her understanding of her condition and goals moving forward.  She reports that the last few days, "have had my head floating in a mess."  We reviewed clinical course and discussed concern that she has metastasis of her triple negative disease.  She reports understanding concern but does not really voice her thoughts on the matter.  She wants more definitive answer on results of biopsy before making further decisions.    Length of Stay: 4  Current Medications: Scheduled Meds:  . atorvastatin  20 mg Oral Daily  . Chlorhexidine Gluconate Cloth  6 each Topical Daily  . docusate sodium  200 mg Oral BID  . enoxaparin (LOVENOX) injection  40 mg Subcutaneous Q24H  . metoprolol tartrate  25 mg Oral BID  . morphine  15 mg Oral Q12H  . polyethylene glycol  17 g Oral BID  . senna-docusate  2 tablet Oral BID    Continuous Infusions: . sodium chloride 250 mL (04/26/19 2006)  . ceFEPime (MAXIPIME) IV 2 g (04/27/19 0606)    PRN Meds: sodium chloride, acetaminophen **OR** acetaminophen, ibuprofen, labetalol, morphine, sodium chloride flush  Physical Exam         General: Alert, awake, in no acute distress.  Heart: Tachy. No murmur appreciated. Lungs: Good air movement, clear Abdomen: Soft,  nontender, nondistended, positive bowel sounds.  Ext: No significant edema Skin: Warm and dry Neuro: Grossly intact, nonfocal.  Vital Signs: BP 135/89 (BP Location: Left Arm)   Pulse (!) 108   Temp 98.5 F (36.9 C) (Oral)   Resp 18   Ht _0  (1.575 m)   Wt 78.5 kg   SpO2 93%   BMI 31.64 kg/m  SpO2: SpO2: 93 % O2 Device: O2 Device: Room Air O2 Flow Rate: O2 Flow Rate (L/min): 2 L/min  Intake/output summary:   Intake/Output Summary (Last 24 hours) at 04/27/2019 0834 Last data filed at 04/26/2019 1753 Gross per 24 hour  Intake 1300 ml  Output 5 ml  Net 1295 ml   LBM: Last BM Date: 04/20/19 Baseline Weight: Weight: 78.5 kg Most recent weight: Weight: 78.5 kg       Palliative Assessment/Data:    Flowsheet Rows     Most Recent Value  Intake Tab  Referral Department  Hospitalist  Unit at Time of Referral  ICU  Palliative Care Primary Diagnosis  Cancer  Date Notified  04/24/19  Palliative Care Type  New Palliative care  Reason for referral  Pain, Clarify Goals of Care  Date of Admission  04/23/19  Date first seen by Palliative Care  04/25/19  # of days Palliative referral response time  1 Day(s)  # of days IP prior to Palliative referral  1  Clinical Assessment  Palliative Performance Scale Score  50%  Psychosocial & Spiritual Assessment  Palliative Care Outcomes  Patient/Family meeting held?  Yes  Who was at the meeting?  Patient  Palliative Care Outcomes  Improved non-pain symptom therapy      Patient Active Problem List   Diagnosis Date Noted  . Goals of care, counseling/discussion 04/25/2019  . Pleural effusion 04/23/2019  . Pleural effusion on right 04/11/2019  . Breast cancer, right ([place]) 03/04/2019  . S/P mastectomy 03/04/2019  . Anemia due to antineoplastic chemotherapy 02/04/2019  . Port-A-Cath in place 01/07/2019  . Family history of breast cancer   . Malignant neoplasm of lower-outer quadrant of right breast of female, estrogen receptor negative  ([place]) 11/04/2018  . Thoracic spine pain 05/03/2018  . Hypokalemia 02/13/2017  . Chronic right hip pain 11/26/2015  . Hyperlipidemia 12/23/2010  . ALLERGIC RHINITIS CAUSE UNSPECIFIED 07/24/2008  . GERD 10/14/2007  . IBS 10/14/2007    Palliative Care Assessment & Plan   Patient Profile: 48 y.o. female  with past medical history of metastatic breast cancer status post mastectomy with new development of chest mass and liver mass now status post biopsy admitted on 04/23/2019 with Sirs, malignant pleural effusion, and high concern for recurrent triple negative breast cancer.  Plan is for her to follow-up with [name] (noted possibly on 8/6) to discuss biopsy results.  Palliative consulted for symptom management and initiation of goals of care discussion.  Recommendations/Plan:  Pain: Improved on current regimen.  She is currently getting MS Contin twice daily and using 3 doses of rescue med (MSIR 15) in 24 hour period.  Recommend continue same.   Constipation: BM today per her report.  With continued opioid usage, recommend continue senna and miralax.  GOC:  Have been attempting to engage in conversation regarding long term [place] in light of new mets, however, she is not really willing to engage in conversation.  States today that she needs more information from biopsy before making decisions.   Goals of Care and Additional Recommendations:  Limitations on Scope of Treatment: Full Scope Treatment  Code Status:    Code Status Orders  (From admission, onward)         Start     Ordered   04/23/19 1746  Full code  Continuous     04/23/19 1746        Code Status History    Date Active Date Inactive Code Status Order ID Comments User Context   04/12/2019 0027 04/18/2019 2111 Full Code [id number]  Elwyn Reach, MD Inpatient  03/04/2019 1827 03/06/2019 1840 Full Code [id number]  Rolm Bookbinder, MD Inpatient   Advance Care Planning Activity       Prognosis:   Unable to  determine  Discharge Planning:  To Be Determined  Care plan was discussed with patient, RN  Thank you for allowing the Palliative Medicine Team to assist in the care of this patient.   Time In: 1800 Time Out: 1830 Total Time 30 Prolonged Time Billed No      Greater than 50%  of this time was spent counseling and coordinating care related to the above assessment and plan.  Micheline Rough, MD  Please contact Palliative Medicine Team phone at [phone] for questions and concerns.

## 2019-04-28 NOTE — Progress Notes (Signed)
PROGRESS NOTE    Brittany Rodgers  ERD:[id number] DOB: 04-09-1971 DOA: 04/23/2019 PCP: [name], MD   Brief Narrative:  Brittany Rodgers is a 48 y.o. female with medical history significant of metastatic breast cancer s/p mastectomy presents to ED with complaints of increased sob and lethargy. Of note, pt is tired appearing and consequently a poor historian at this time. Per chart review, pt was seen recently in Oncology clinic on 7/28, where pt was noted to have new chest wall mass as well as liver mass. Pt was being considered for possible bipopsy. On day of admit, pt noted to be increasingly sob with tachycardia and was subseqently referred to ED from Oncology clinic.  ED Course: In the ED, pt was noted to have HR in excess of 140's, ultimately improved to the 110's with one dose of IV metoprolol. Pt underwent CT chest with findings worrisome for large R sided effusion. Pt remained on minimal O2 support. IR was consulted by EDP and hospitalist consulted for consideration for admission.  Assessment & Plan:   Principal Problem:   Pleural effusion Active Problems:   IBS   Hyperlipidemia   Chronic right hip pain   Malignant neoplasm of lower-outer quadrant of right breast of female, estrogen receptor negative (HCC)   S/P mastectomy   Pleural effusion on right  # SIRS with concern for sepsis  Presyncope: fever, tachycardia, concerning for infection/sepsis.  Will bolus and start on vanc/cefepime.  She's currently on RA without tachypnea and has a normal WBC count. - Could be related to pleural effusion? Vs UTI with UA with >50 WBC and large LE. - urine cx with multiple species - blood cx NGTD, body fluid culture NG - continue vanc and cefepime -> will narrow to cefepime.  Continue to monitor.  Last fever 7/31 PM.  WBC normal.  - continue to monitor, appears to be improving  1. Malignant Pleural Effusion 1. Currently on minimal O2 support 2. CT with moderate to large R pleural  effusion with associated atelectasis or consolidation 3. Now s/p thoracentesis 4. Follow culture, gram stain without organism.  Follow cytology (with malignant cells consistent with metastatic adenocarcinoma).  Acid fast smear and culture pending.  Exudative by lights. 5.   CXR 7/31 with large R pleural effusion with consolidation of R mid and lower lung.  Pt will likely benefit from pleurx during this admission.  Consult for pleurx by IR placed.  2. Stage IV Breast cancer, s/p mastectomy 1. Followed by [name] 2. New chest wall mass and liver lesion noted recently 3. Pt also has mutliple small bilateral pulm nodules, new or enlarged from prior exam concerning for metastatic disease (vs infection) 4. Appreciate 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 Continue pain medications 6. Appreciate palliative care for assistance with [place] conversations  3. HLD 1. Seems stable. 2. Cont statin per home regimen  4. Sinus tach 1. Hr improved today, still mildly tachy, but better 2. Continue metoprolol  5. Hx IBS 1. Seems stable at this time  6. Chronic pain 1. Will continue home pain regimen -> now adjusted to include MSIR and MS contin  DVT prophylaxis: lovenox Code Status: full  Family Communication: discussed with husband 8/3 Disposition Plan: pending further w/u and imrpovement   Consultants:   IR  Oncology  Palliative care  Procedures:  CT  guided core bx R anterior chest wall mass 7/30 US thoracentesis 7/30  Antimicrobials:  Anti-infectives (From admission, onward)   Start     Dose/Rate Route Frequency Ordered Stop   04/25/19 0400  vancomycin (VANCOCIN) IVPB 750 mg/150 ml premix  Status:  Discontinued     750 mg 150 mL/hr over 60 Minutes Intravenous Every 12 hours 04/24/19 1332 04/25/19 1447   04/24/19 2200  ceFEPIme  (MAXIPIME) 2 g in sodium chloride 0.9 % 100 mL IVPB     2 g 200 mL/hr over 30 Minutes Intravenous Every 8 hours 04/24/19 1309     04/24/19 1315  ceFEPIme (MAXIPIME) 2 g in sodium chloride 0.9 % 100 mL IVPB     2 g 200 mL/hr over 30 Minutes Intravenous STAT 04/24/19 1307 04/24/19 1536   04/24/19 1315  vancomycin (VANCOCIN) 1,750 mg in sodium chloride 0.9 % 500 mL IVPB     1,750 mg 250 mL/hr over 120 Minutes Intravenous STAT 04/24/19 1307 04/24/19 1915     Subjective: Husband at bedside No complaints Wants to keep pain meds the same  Objective: Vitals:   04/27/19 1253 04/27/19 2100 04/28/19 0539 04/28/19 1425  BP: (!) 140/91 (!) 143/95 (!) 144/90 (!) 160/106  Pulse: (!) 107 (!) 109 (!) 105 (!) 105  Resp: 20 20 20 16   Temp: 98.3 F (36.8 C) 98.8 F (37.1 C) 98.6 F (37 C) 98.3 F (36.8 C)  TempSrc: Oral Oral Oral Oral  SpO2: 96% 97% 95% 100%  Weight:      Height:        Intake/Output Summary (Last 24 hours) at 04/28/2019 1804 Last data filed at 04/28/2019 1600 Gross per 24 hour  Intake 1383.13 ml  Output -  Net 1383.13 ml   Filed Weights   04/24/19 0003  Weight: 78.5 kg    Examination:  General: No acute distress. Cardiovascular: Heart sounds show a regular rate, and rhythm.  Lungs: Clear to auscultation bilaterally Abdomen: Soft, nontender, nondistended  Neurological: Alert and oriented 3. Moves all extremities 4. Cranial nerves II through XII grossly intact. Skin: Warm and dry. No rashes or lesions. Extremities: No clubbing or cyanosis. No edema.   Data Reviewed: I have personally reviewed following labs and imaging studies  CBC: Recent Labs  Lab 04/23/19 0834 04/23/19 1309 04/24/19 0453 04/25/19 0230 04/26/19 0449 04/27/19 0414  WBC 9.7 10.1 7.5 6.8 5.3 6.0  NEUTROABS 7.4 7.2  --   --   --   --   HGB 11.3* 10.8* 9.4* 9.0* 10.2* 9.4*  HCT 37.8 35.6* 32.4* 30.1* 35.2* 32.3*  MCV 86.3 87.5 88.8 89.3 87.8 88.0  PLT 383 364 319 298 367 405*   Basic  Metabolic Panel: Recent Labs  Lab 04/23/19 1309 04/24/19 0453 04/25/19 0230 04/26/19 0449 04/27/19 0414  NA 139 140 140 140 141  K 3.5 3.9 3.8 4.0 3.9  CL 101 104 104 103 105  CO2 27 27 26 28 26   GLUCOSE 126* 107* 121* 104* 105*  BUN 8 10 8 8 8   CREATININE 0.62 0.68 0.65 0.69 0.72  CALCIUM 9.9 9.4 8.7* 9.7 9.7  MG  --   --  1.6* 1.9 1.9   GFR: Estimated Creatinine Clearance: 82.6 mL/min (by C-G formula based on SCr of 0.72 mg/dL). Liver Function Tests: Recent Labs  Lab 04/23/19 1309 04/24/19 0453 04/25/19 0230 04/26/19 0449 04/27/19 0414  AST 12* 10* 11* 9* 9*  ALT 36 28 24 22 19   ALKPHOS 113 95 83  85 88  BILITOT 0.3 0.4 0.3 0.6 0.3  PROT 7.2 6.4* 5.7* 6.8 6.6  ALBUMIN 3.6 3.1* 2.7* 3.1* 2.9*   No results for input(s): LIPASE, AMYLASE in the last 168 hours. No results for input(s): AMMONIA in the last 168 hours. Coagulation Profile: No results for input(s): INR, PROTIME in the last 168 hours. Cardiac Enzymes: No results for input(s): CKTOTAL, CKMB, CKMBINDEX, TROPONINI in the last 168 hours. BNP (last 3 results) No results for input(s): PROBNP in the last 8760 hours. HbA1C: No results for input(s): HGBA1C in the last 72 hours. CBG: No results for input(s): GLUCAP in the last 168 hours. Lipid Profile: No results for input(s): CHOL, HDL, LDLCALC, TRIG, CHOLHDL, LDLDIRECT in the last 72 hours. Thyroid Function Tests: No results for input(s): TSH, T4TOTAL, FREET4, T3FREE, THYROIDAB in the last 72 hours. Anemia Panel: No results for input(s): VITAMINB12, FOLATE, FERRITIN, TIBC, IRON, RETICCTPCT in the last 72 hours. Sepsis Labs: Recent Labs  Lab 04/24/19 1650 04/24/19 1932 04/25/19 0230 04/26/19 0449  PROCALCITON  --   --  <0.10 <0.10  LATICACIDVEN 2.1* 1.9  --   --     Recent Results (from the past 240 hour(s))  SARS Coronavirus 2 (CEPHEID - Performed in [place] hospital lab), Hosp Order     Status: None   Collection Time: 04/23/19  1:06 PM    Specimen: Nasopharyngeal Swab  Result Value Ref Range Status   SARS Coronavirus 2 NEGATIVE NEGATIVE Final    Comment: (NOTE) If result is NEGATIVE SARS-CoV-2 target nucleic acids are NOT DETECTED. The SARS-CoV-2 RNA is generally detectable in upper and lower  respiratory specimens during the acute phase of infection. The lowest  concentration of SARS-CoV-2 viral copies this assay can detect is 250  copies / mL. A negative result does not preclude SARS-CoV-2 infection  and should not be used as the sole basis for treatment or other  patient management decisions.  A negative result may occur with  improper specimen collection / handling, submission of specimen other  than nasopharyngeal swab, presence of viral mutation(s) within the  areas targeted by this assay, and inadequate number of viral copies  (<250 copies / mL). A negative result must be combined with clinical  observations, patient history, and epidemiological information. If result is POSITIVE SARS-CoV-2 target nucleic acids are DETECTED. The SARS-CoV-2 RNA is generally detectable in upper and lower  respiratory specimens dur ing the acute phase of infection.  Positive  results are indicative of active infection with SARS-CoV-2.  Clinical  correlation with patient history and other diagnostic information is  necessary to determine patient infection status.  Positive results do  not rule out bacterial infection or co-infection with other viruses. If result is PRESUMPTIVE POSTIVE SARS-CoV-2 nucleic acids MAY BE PRESENT.   A presumptive positive result was obtained on the submitted specimen  and confirmed on repeat testing.  While 2019 novel coronavirus  (SARS-CoV-2) nucleic acids may be present in the submitted sample  additional confirmatory testing may be necessary for epidemiological  and / or clinical management purposes  to differentiate between  SARS-CoV-2 and other Sarbecovirus currently known to infect humans.  If  clinically indicated additional testing with an alternate test  methodology [phone]) is advised. The SARS-CoV-2 RNA is generally  detectable in upper and lower respiratory sp ecimens during the acute  phase of infection. The expected result is Negative. Fact Sheet for Patients:  StrictlyIdeas.no Fact Sheet for Healthcare Providers: BankingDealers.co.za This test is not yet  approved or cleared by the [place] and has been authorized for detection and/or diagnosis of SARS-CoV-2 by FDA under an Emergency Use Authorization (EUA).  This EUA will remain in effect (meaning this test can be used) for the duration of the COVID-19 declaration under Section 564(b)(1) of the Act, 21 U.S.C. section 360bbb-3(b)(1), unless the authorization is terminated or revoked sooner. Performed at [facility], [place] [address], [place], [place] [address]   Acid Fast Smear (AFB)     Status: None   Collection Time: 04/24/19 10:37 AM   Specimen: Pleural, Right; Pleural Fluid  Result Value Ref Range Status   AFB Specimen Processing Concentration  Final   Acid Fast Smear Negative  Final    Comment: (NOTE) Performed At: [facility] [place], [place] [id number] Rush Farmer MD YY:[id number]    Source (AFB) PLEURAL  Final    Comment: RT Performed at [facility], [place] [address] [place], [place] [address]   Culture, body fluid-bottle     Status: None (Preliminary result)   Collection Time: 04/24/19 10:37 AM   Specimen: Pleura  Result Value Ref Range Status   Specimen Description PLEURAL RIGHT  Final   Special Requests NONE  Final   Culture   Final    NO GROWTH 4 DAYS Performed at [facility], [address], [place], [place] [address]    Report Status PENDING  Incomplete  Gram stain     Status: None   Collection Time: 04/24/19 10:37 AM   Specimen: Pleura  Result Value Ref Range  Status   Specimen Description PLEURAL RIGHT  Final   Special Requests NONE  Final   Gram Stain   Final    FEW WBC PRESENT, PREDOMINANTLY MONONUCLEAR NO ORGANISMS SEEN Performed at [facility], [address], [place], [place] [address]    Report Status 04/24/2019 FINAL  Final  Culture, blood (routine x 2)     Status: None (Preliminary result)   Collection Time: 04/24/19  1:35 PM   Specimen: BLOOD LEFT HAND  Result Value Ref Range Status   Specimen Description   Final    BLOOD LEFT HAND Performed at [facility] [address], [place], [place] [address]    Special Requests   Final    BOTTLES DRAWN AEROBIC ONLY Blood Culture adequate volume   Culture   Final    NO GROWTH 4 DAYS Performed at [facility], [place] [address], [place], [place] [address]    Report Status PENDING  Incomplete  Culture, blood (routine x 2)     Status: None (Preliminary result)   Collection Time: 04/24/19  1:35 PM   Specimen: BLOOD LEFT HAND  Result Value Ref Range Status   Specimen Description   Final    BLOOD LEFT HAND Performed at [facility] [address], [place], [place] [address]    Special Requests   Final    BOTTLES DRAWN AEROBIC ONLY Blood Culture results may not be optimal due to an inadequate volume of blood received in culture bottles Performed at [facility] [address], [place], [place] [address]    Culture   Final    NO GROWTH 4 DAYS Performed at [facility], [place] [address] [place], [place] [address]    Report Status PENDING  Incomplete  Culture, Urine     Status: Abnormal   Collection Time: 04/24/19  4:50 PM   Specimen: Urine, Random  Result Value Ref Range Status   Specimen Description   Final    URINE, RANDOM Performed at [facility] [address] [place], [place] [address]    Special Requests   Final    NONE Performed at [facility], [place]  [address], [place], [place] [address]    Culture MULTIPLE SPECIES PRESENT, SUGGEST RECOLLECTION (A)  Final   Report Status 04/25/2019 FINAL  Final         Radiology Studies: No results found.      Scheduled Meds: . atorvastatin  20 mg Oral Daily  . Chlorhexidine Gluconate Cloth  6 each Topical Daily  . docusate sodium  200 mg Oral BID  . enoxaparin (LOVENOX) injection  40 mg Subcutaneous Q24H  . metoprolol tartrate  25 mg Oral BID  . morphine  15 mg Oral Q12H  . polyethylene glycol  17 g Oral BID  . senna-docusate  2 tablet Oral BID   Continuous Infusions: . sodium chloride Stopped (04/27/19 2248)  . ceFEPime (MAXIPIME) IV 2 g (04/28/19 1321)     LOS: 5 days    Time spent: over 30 min    Fayrene Helper, MD [facility] Pager [email or web address]  If 7PM-7AM, please contact night-coverage [email or web address] Password [email or web address] 04/28/2019, 6:04 PM

## 2019-04-28 NOTE — Progress Notes (Signed)
Daily Progress Note   Patient Name: Brittany Rodgers       Date: 04/28/2019 DOB: 05-May-1971  Age: 48 y.o. MRN#: [id number] Attending Physician: Elodia Florence., * Primary Care Physician: [name], MD Admit Date: 04/23/2019  Reason for Consultation/Follow-up: Establishing goals of care  Subjective: I met with Brittany Rodgers today.  She reports that pain has been better controlled on current regimen.  Requests to continue same.  She had a large BM today per her report.  Continued to gently explore her understanding of her condition and goals moving forward.  See below.  Length of Stay: 5  Current Medications: Scheduled Meds:  . atorvastatin  20 mg Oral Daily  . Chlorhexidine Gluconate Cloth  6 each Topical Daily  . docusate sodium  200 mg Oral BID  . enoxaparin (LOVENOX) injection  40 mg Subcutaneous Q24H  . metoprolol tartrate  25 mg Oral BID  . morphine  15 mg Oral Q12H  . polyethylene glycol  17 g Oral BID  . senna-docusate  2 tablet Oral BID    Continuous Infusions: . sodium chloride Stopped (04/27/19 2248)  . ceFEPime (MAXIPIME) IV 2 g (04/28/19 2134)    PRN Meds: sodium chloride, acetaminophen **OR** acetaminophen, benzonatate, ibuprofen, labetalol, morphine, sodium chloride flush  Physical Exam         General: Alert, awake, in no acute distress.  Heart: Tachy. No murmur appreciated. Lungs: Good air movement, clear Abdomen: Soft, nontender, nondistended, positive bowel sounds.  Ext: No significant edema Skin: Warm and dry Neuro: Grossly intact, nonfocal.  Vital Signs: BP (!) 155/93 (BP Location: Left Arm)   Pulse (!) 119   Temp 97.9 F (36.6 C)   Resp 17   Ht 5' 2"  (1.575 m)   Wt 78.5 kg   SpO2 92%   BMI 31.64 kg/m  SpO2: SpO2: 92 % O2  Device: O2 Device: Room Air O2 Flow Rate: O2 Flow Rate (L/min): 2 L/min  Intake/output summary:   Intake/Output Summary (Last 24 hours) at 04/28/2019 2335 Last data filed at 04/28/2019 1600 Gross per 24 hour  Intake 970.2 ml  Output -  Net 970.2 ml   LBM: Last BM Date: 04/25/19 Baseline Weight: Weight: 78.5 kg Most recent weight: Weight: 78.5 kg       Palliative Assessment/Data:  Flowsheet Rows     Most Recent Value  Intake Tab  Referral Department  Hospitalist  Unit at Time of Referral  ICU  Palliative Care Primary Diagnosis  Cancer  Date Notified  04/24/19  Palliative Care Type  New Palliative care  Reason for referral  Pain, Clarify Goals of Care  Date of Admission  04/23/19  Date first seen by Palliative Care  04/25/19  # of days Palliative referral response time  1 Day(s)  # of days IP prior to Palliative referral  1  Clinical Assessment  Palliative Performance Scale Score  50%  Psychosocial & Spiritual Assessment  Palliative Care Outcomes  Patient/Family meeting held?  Yes  Who was at the meeting?  Patient  Palliative Care Outcomes  Improved non-pain symptom therapy      Patient Active Problem List   Diagnosis Date Noted  . Goals of care, counseling/discussion 04/25/2019  . Pleural effusion 04/23/2019  . Pleural effusion on right 04/11/2019  . Breast cancer, right ([place]) 03/04/2019  . S/P mastectomy 03/04/2019  . Anemia due to antineoplastic chemotherapy 02/04/2019  . Port-A-Cath in place 01/07/2019  . Family history of breast cancer   . Malignant neoplasm of lower-outer quadrant of right breast of female, estrogen receptor negative ([place] [facility]) 11/04/2018  . Thoracic spine pain 05/03/2018  . Hypokalemia 02/13/2017  . Chronic right hip pain 11/26/2015  . Hyperlipidemia 12/23/2010  . ALLERGIC RHINITIS CAUSE UNSPECIFIED 07/24/2008  . GERD 10/14/2007  . IBS 10/14/2007    Palliative Care Assessment & Plan   Patient Profile: 48 y.o. female  with past medical  history of metastatic breast cancer status post mastectomy with new development of chest mass and liver mass now status post biopsy admitted on 04/23/2019 with [facility], malignant pleural effusion, and high concern for recurrent triple negative breast cancer.  Plan is for her to follow-up with Dr. Jana Rodgers (noted possibly on 8/6) to discuss biopsy results.  Palliative consulted for symptom management and initiation of goals of care discussion.  Recommendations/Plan:  Pain: Improved on current regimen.  She is currently getting MS Contin twice daily and using MSIR 15 as needed.  Recommend continue same.   Constipation: BM today per her report.  With continued opioid usage, recommend continue senna and miralax.  GOC:  Have been attempting to engage in conversation regarding long term [place] in light of new mets, however, she limited in how much she is willing to engage in conversation.  We did discuss today that 1) Her husband would be her surrogate if she could not make her own decisions, 2) she has metastatic disease that is not going to go away and goal of therapies is to slow or stop progression, and 3) she is planning to get pleurex catheter.  She trusts Dr. Jana Rodgers and will continue to rely on him to assist her in decision making moving forward.   Goals of Care and Additional Recommendations:  Limitations on Scope of Treatment: Full Scope Treatment  Code Status:    Code Status Orders  (From admission, onward)         Start     Ordered   04/23/19 1746  Full code  Continuous     04/23/19 1746        Code Status History    Date Active Date Inactive Code Status Order ID Comments User Context   04/12/2019 0027 04/18/2019 2111 Full Code [id number]  Elwyn Reach, MD Inpatient   03/04/2019 1827 03/06/2019 [place] Full Code [id number]  Brittany Bookbinder, MD Inpatient   Advance Care Planning Activity       Prognosis:   Unable to determine  Discharge Planning:  To Be Determined  Care plan  was discussed with patient, RN, and Dr. Florene Rodgers  Thank you for allowing the Palliative Medicine Team to assist in the care of this patient.   Time In: 1300 Time Out: 1330 Total Time 30 Prolonged Time Billed No      Greater than 50%  of this time was spent counseling and coordinating care related to the above assessment and plan.  Micheline Rough, MD  Please contact Palliative Medicine Team phone at [phone] for questions and concerns.

## 2019-04-29 DIAGNOSIS — C7989 Secondary malignant neoplasm of other specified sites: Principal | ICD-10-CM

## 2019-04-29 LAB — COMPREHENSIVE METABOLIC PANEL
ALT: 19 U/L (ref 0–44)
AST: 10 U/L — ABNORMAL LOW (ref 15–41)
Albumin: 3 g/dL — ABNORMAL LOW (ref 3.5–5.0)
Alkaline Phosphatase: 98 U/L (ref 38–126)
Anion gap: 10 (ref 5–15)
BUN: 7 mg/dL (ref 6–20)
CO2: 26 mmol/L (ref 22–32)
Calcium: 11.1 mg/dL — ABNORMAL HIGH (ref 8.9–10.3)
Chloride: 107 mmol/L (ref 98–111)
Creatinine, Ser: 0.67 mg/dL (ref 0.44–1.00)
GFR calc Af Amer: >60 mL/min (ref >60)
GFR calc non Af Amer: >60 mL/min (ref >60)
Glucose, Bld: 104 mg/dL — ABNORMAL HIGH (ref 70–99)
Potassium: 3.6 mmol/L (ref 3.5–5.1)
Sodium: 143 mmol/L (ref 135–145)
Total Bilirubin: 0.4 mg/dL (ref 0.3–1.2)
Total Protein: 6.9 g/dL (ref 6.5–8.1)

## 2019-04-29 LAB — CULTURE, BODY FLUID W GRAM STAIN -BOTTLE: Culture: NO GROWTH

## 2019-04-29 LAB — CBC
HCT: 32.1 % — ABNORMAL LOW (ref 36.0–46.0)
Hemoglobin: 9.5 g/dL — ABNORMAL LOW (ref 12.0–15.0)
MCH: 26 pg (ref 26.0–34.0)
MCHC: 29.6 g/dL — ABNORMAL LOW (ref 30.0–36.0)
MCV: 87.7 fL (ref 80.0–100.0)
Platelets: 328 10*3/uL (ref 150–400)
RBC: 3.66 MIL/uL — ABNORMAL LOW (ref 3.87–5.11)
RDW: 18.3 % — ABNORMAL HIGH (ref 11.5–15.5)
WBC: 7.7 10*3/uL (ref 4.0–10.5)
nRBC: 0 % (ref 0.0–0.2)

## 2019-04-29 LAB — CULTURE, BLOOD (ROUTINE X 2)
Culture: NO GROWTH
Culture: NO GROWTH
Special Requests: ADEQUATE

## 2019-04-29 LAB — MAGNESIUM: Magnesium: 1.8 mg/dL (ref 1.7–2.4)

## 2019-04-29 MED ORDER — SODIUM CHLORIDE 0.9 % IV BOLUS
1000.0000 mL | Freq: Once | INTRAVENOUS | Status: AC
  Administered 2019-04-29: 1000 mL via INTRAVENOUS

## 2019-04-29 MED ORDER — SODIUM CHLORIDE 0.9 % IV SOLN: INTRAVENOUS | Status: AC

## 2019-04-29 NOTE — Progress Notes (Signed)
[place]   DOB:[birth year]   PQ#:[id number]   JFH#:[id number]  Subjective:  Brittany Rodgers appears comfortable in bed.  She tells me that she gave her husband the news yesterday when he visited.  Apparently she let him see the pathology report and also Dr.Powell discussed things with him.  I asked her if I should call her husband today but she said that it was better to talk to him face-to-face.  I left word that if her husband comes to visit they can call me in the clinic and I will pop in and discuss things with them.  Objective: young [place] American woman who appears comfortable in bed. Vitals:   04/28/19 2230 04/29/19 0522  BP:  (!) 157/99  Pulse: (!) 119 (!) 113  Resp:  17  Temp:  98 F (36.7 C)  SpO2: 92% 95%    Body mass index is 31.64 kg/m.  Intake/Output Summary (Last 24 hours) at 04/29/2019 0826 Last data filed at 04/29/2019 0300 Gross per 24 hour  Intake 866.96 ml  Output -  Net 866.96 ml       CBG (last 3)  No results for input(s): GLUCAP in the last 72 hours.   Labs:  Lab Results  Component Value Date   WBC 7.7 04/29/2019   HGB 9.5 (L) 04/29/2019   HCT 32.1 (L) 04/29/2019   MCV 87.7 04/29/2019   PLT 328 04/29/2019   NEUTROABS 7.2 04/23/2019    @LASTCHEMISTRY @  Urine Studies No results for input(s): UHGB, CRYS in the last 72 hours.  Invalid input(s): UACOL, UAPR, USPG, UPH, UTP, UGL, UKET, UBIL, UNIT, UROB, [place], UEPI, UWBC, Duwayne Heck [place], [place]  Basic Metabolic Panel: Recent Labs  Lab 04/24/19 0453 04/25/19 0230 04/26/19 0449 04/27/19 0414 04/29/19 0558  NA 140 140 140 141 143  K 3.9 3.8 4.0 3.9 3.6  CL 104 104 103 105 107  CO2 27 26 28 26 26   GLUCOSE 107* 121* 104* 105* 104*  BUN 10 8 8 8 7   CREATININE 0.68 0.65 0.69 0.72 0.67  CALCIUM 9.4 8.7* 9.7 9.7 11.1*  MG  --  1.6* 1.9 1.9 1.8   GFR Estimated Creatinine Clearance: 82.6 mL/min (by C-G formula based on SCr of 0.67 mg/dL). Liver Function Tests: Recent Labs  Lab  04/24/19 0453 04/25/19 0230 04/26/19 0449 04/27/19 0414 04/29/19 0558  AST 10* 11* 9* 9* 10*  ALT 28 24 22 19 19   ALKPHOS 95 83 85 88 98  BILITOT 0.4 0.3 0.6 0.3 0.4  PROT 6.4* 5.7* 6.8 6.6 6.9  ALBUMIN 3.1* 2.7* 3.1* 2.9* 3.0*   No results for input(s): LIPASE, AMYLASE in the last 168 hours. No results for input(s): AMMONIA in the last 168 hours. Coagulation profile No results for input(s): INR, PROTIME in the last 168 hours.  CBC: Recent Labs  Lab 04/23/19 0834 04/23/19 1309 04/24/19 0453 04/25/19 0230 04/26/19 0449 04/27/19 0414 04/29/19 0558  WBC 9.7 10.1 7.5 6.8 5.3 6.0 7.7  NEUTROABS 7.4 7.2  --   --   --   --   --   HGB 11.3* 10.8* 9.4* 9.0* 10.2* 9.4* 9.5*  HCT 37.8 35.6* 32.4* 30.1* 35.2* 32.3* 32.1*  MCV 86.3 87.5 88.8 89.3 87.8 88.0 87.7  PLT 383 364 319 298 367 405* 328   Cardiac Enzymes: No results for input(s): CKTOTAL, CKMB, CKMBINDEX, TROPONINI in the last 168 hours. BNP: Invalid input(s): POCBNP CBG: No results for input(s): GLUCAP in the last 168 hours. D-Dimer No  results for input(s): DDIMER in the last 72 hours. Hgb A1c No results for input(s): HGBA1C in the last 72 hours. Lipid Profile No results for input(s): CHOL, HDL, LDLCALC, TRIG, CHOLHDL, LDLDIRECT in the last 72 hours. Thyroid function studies No results for input(s): TSH, T4TOTAL, T3FREE, THYROIDAB in the last 72 hours.  Invalid input(s): FREET3 Anemia work up No results for input(s): VITAMINB12, FOLATE, FERRITIN, TIBC, IRON, RETICCTPCT in the last 72 hours. Microbiology Recent Results (from the past 240 hour(s))  SARS Coronavirus 2 (CEPHEID - Performed in [place] hospital lab), Hosp Order     Status: None   Collection Time: 04/23/19  1:06 PM   Specimen: Nasopharyngeal Swab  Result Value Ref Range Status   SARS Coronavirus 2 NEGATIVE NEGATIVE Final    Comment: (NOTE) If result is NEGATIVE SARS-CoV-2 target nucleic acids are NOT DETECTED. The SARS-CoV-2 RNA is generally  detectable in upper and lower  respiratory specimens during the acute phase of infection. The lowest  concentration of SARS-CoV-2 viral copies this assay can detect is 250  copies / mL. A negative result does not preclude SARS-CoV-2 infection  and should not be used as the sole basis for treatment or other  patient management decisions.  A negative result may occur with  improper specimen collection / handling, submission of specimen other  than nasopharyngeal swab, presence of viral mutation(s) within the  areas targeted by this assay, and inadequate number of viral copies  (<250 copies / mL). A negative result must be combined with clinical  observations, patient history, and epidemiological information. If result is POSITIVE SARS-CoV-2 target nucleic acids are DETECTED. The SARS-CoV-2 RNA is generally detectable in upper and lower  respiratory specimens dur ing the acute phase of infection.  Positive  results are indicative of active infection with SARS-CoV-2.  Clinical  correlation with patient history and other diagnostic information is  necessary to determine patient infection status.  Positive results do  not rule out bacterial infection or co-infection with other viruses. If result is PRESUMPTIVE POSTIVE SARS-CoV-2 nucleic acids MAY BE PRESENT.   A presumptive positive result was obtained on the submitted specimen  and confirmed on repeat testing.  While 2019 novel coronavirus  (SARS-CoV-2) nucleic acids may be present in the submitted sample  additional confirmatory testing may be necessary for epidemiological  and / or clinical management purposes  to differentiate between  SARS-CoV-2 and other Sarbecovirus currently known to infect humans.  If clinically indicated additional testing with an alternate test  methodology [phone]) is advised. The SARS-CoV-2 RNA is generally  detectable in upper and lower respiratory sp ecimens during the acute  phase of infection. The  expected result is Negative. Fact Sheet for Patients:  StrictlyIdeas.no Fact Sheet for Healthcare Providers: BankingDealers.co.za This test is not yet approved or cleared by the [place] FDA and has been authorized for detection and/or diagnosis of SARS-CoV-2 by FDA under an Emergency Use Authorization (EUA).  This EUA will remain in effect (meaning this test can be used) for the duration of the COVID-19 declaration under Section 564(b)(1) of the Act, 21 U.S.C. section 360bbb-3(b)(1), unless the authorization is terminated or revoked sooner. Performed at [facility], [place] [address], [place], [place] [address]   Acid Fast Smear (AFB)     Status: None   Collection Time: 04/24/19 10:37 AM   Specimen: Pleural, Right; Pleural Fluid  Result Value Ref Range Status   AFB Specimen Processing Concentration  Final   Acid Fast  Smear Negative  Final    Comment: (NOTE) Performed At: [facility] [place], [place] [id number] Rush Farmer MD MV:[id number]    Source (AFB) PLEURAL  Final    Comment: RT Performed at [facility], [place] [address] [place], [place] [address]   Culture, body fluid-bottle     Status: None (Preliminary result)   Collection Time: 04/24/19 10:37 AM   Specimen: Pleura  Result Value Ref Range Status   Specimen Description PLEURAL RIGHT  Final   Special Requests NONE  Final   Culture   Final    NO GROWTH 4 DAYS Performed at [facility], [address] [place], [place] [address]    Report Status PENDING  Incomplete  Gram stain     Status: None   Collection Time: 04/24/19 10:37 AM   Specimen: Pleura  Result Value Ref Range Status   Specimen Description PLEURAL RIGHT  Final   Special Requests NONE  Final   Gram Stain   Final    FEW WBC PRESENT, PREDOMINANTLY MONONUCLEAR NO ORGANISMS SEEN Performed at [facility], [address],  [place], [place] [address]    Report Status 04/24/2019 FINAL  Final  Culture, blood (routine x 2)     Status: None (Preliminary result)   Collection Time: 04/24/19  1:35 PM   Specimen: BLOOD LEFT HAND  Result Value Ref Range Status   Specimen Description   Final    BLOOD LEFT HAND Performed at [place] [address] [place], [place] [address]    Special Requests   Final    BOTTLES DRAWN AEROBIC ONLY Blood Culture adequate volume   Culture   Final    NO GROWTH 4 DAYS Performed at [facility], [place] [address], [place], [place] [address]    Report Status PENDING  Incomplete  Culture, blood (routine x 2)     Status: None (Preliminary result)   Collection Time: 04/24/19  1:35 PM   Specimen: BLOOD LEFT HAND  Result Value Ref Range Status   Specimen Description   Final    BLOOD LEFT HAND Performed at [place] [address] [place], [place] [address]    Special Requests   Final    BOTTLES DRAWN AEROBIC ONLY Blood Culture results may not be optimal due to an inadequate volume of blood received in culture bottles Performed at [place] [address], [place], [place] [address]    Culture   Final    NO GROWTH 4 DAYS Performed at [facility], [place] [address], [place], [place] [address]    Report Status PENDING  Incomplete  Culture, Urine     Status: Abnormal   Collection Time: 04/24/19  4:50 PM   Specimen: Urine, Random  Result Value Ref Range Status   Specimen Description   Final    URINE, RANDOM Performed at [place] [address], [place], [place] [address]    Special Requests   Final    NONE Performed at [facility], [place] [address] [place], [place] [address]    Culture MULTIPLE SPECIES PRESENT, SUGGEST RECOLLECTION (A)  Final   Report Status 04/25/2019 FINAL  Final      Studies:  No results found.  Assessment: 48 y.o. [place],  woman  status post right breast lower outer quadrant biopsy 10/29/2018 for a clinical T2 N0, stage IIB invasive ductal carcinoma,  grade 3, triple negative, with an MIB-1 of 80%             (a) CT of the chest 11/15/2018 shows possible liver metastases, but liver biopsy 11/25/2018 consistent with hemangioma             (b) bone scan 11/15/2018 shows no suspicious bone lesions  (1) neoadjuvant chemotherapy consisting of doxorubicin and cyclophosphamide in dose dense fashion x4, started 11/26/2018, completed 01/07/2019, followed by weekly paclitaxel and carboplatin x12 starting 01/28/2019  (2) patient desires ovarian function preservation:              (a) Zoladex started 11/08/2018, discontinued 02/04/2019             (b) patient is status post bilateral tubal ligation  (3) status post right mastectomy with sentinel lymph node sampling 03/04/2019 for a ypTac ypN1a invasive ductal carcinoma grade 3, repeat prognostic panel again triple negative             (a) a total of 6 axillary lymph nodes removed, 2 sentinel ones, 1 positive  (4) adjuvant radiation to be considered  (5) genetics counseling on 11/28/2018, testing was recommended, but declined by patient.  METASTATIC DISEASE: July 2020 (6) Staging studies:             (a) chest CT scan and bone scan 11/15/2018 show a liver mass, no lung or bone lesions             (b) liver biopsy 11/25/2018 shows a hemangioma             (c) chest CT Angio 03/26/2019 again shows liver mass, right breast "seroma"  (d) cytology from R thoracentesis 04/12/2019 nondiagnostic  (e) CT angio 04/23/2019 shows recurrent right effusion, multiple bilateral pulmonary nodules, invasive anterior right chest wall lesion  (7) pembrolizumab started 04/02/2019, to be repeated every 21 days  (8) CT angio 04/23/2019 shows new lung nodules, likely metastatic  (9) repeat thoracentesis 04/24/2019-- cytology positive  (10) biopsy R chest wall mass 04/24/2019 confirms  recurrent/metastatic breast cancer  (a) CARIS study results pending  (11) to start carboplatin and gemcitabine 05/06/2019, to be repeated days 1 and 8 of each 21-day cycle    Plan:  Brittany Rodgers has not been walking in her room except to go to the bathroom and has not walked in the hallways.  Her pain is generally well controlled on the current medications.  She is still somewhat constipated despite the bowel prophylaxis.  She appears to be very private regarding her diagnosis and Johnny told Dr. Florene Glen that no one in her family knows why she is in the hospital or that she has metastatic cancer.  Apparently she did share this information with her husband yesterday and she tells me Dr. Florene Glen was there and discussed things with her husband.  We now have a more liberal visitation policy and I have asked them to let me know when the husband is visiting so I can drop in and discuss things with him.  She preferred I not call her husband today to discuss things.  We are trying to get a Pleurx placed so she does not have to have recurrent thoracenteses.  We were going to be treating her with chemo this Friday but if the Pleurx cannot be placed on till Thursday I would prefer to change her chemo date to August 11 then and that will be the target date to start carboplatin and gemcitabine.  She will continue to receive  pembrolizumab  I greatly appreciate Dr. Abel Presto efforts on behalf of this patient!         Chauncey Cruel, MD 04/29/2019  8:26 AM Medical Oncology and Hematology [facility] [place] [address] [place],  [address] Tel. [phone]    Fax. [phone]

## 2019-04-29 NOTE — Evaluation (Signed)
Occupational Therapy Evaluation Patient Details Name: Brittany Rodgers MRN: [id number] DOB: 26-Jan-1971 Today's Date: 04/29/2019    History of Present Illness Brittany Rodgers is a 48 y.o. female with medical history significant of metastatic breast cancer s/p mastectomy. She presented to ED on 04/23/19 with complaints of increased sob and lethargy.   Clinical Impression   Pt reports that she was mod I with adls prior to admission.  She could do everything with increased effort and time.  She also reports that she was going to OP PT and wants to resume that. She has a HEP (but not with her) and states that she follows it when she thinks of it. She needs set up/supervision at this time and goals are for mod I level.  Will try to provide  written HEP for pt while she is an inpt   Follow Up Recommendations  Home health OT;Supervision - Intermittent    Equipment Recommendations  None recommended by OT    Recommendations for Other Services       Precautions / Restrictions Precautions Precautions: Fall Precaution Comments: R recent mastectomy Restrictions Weight Bearing Restrictions: No Other Position/Activity Restrictions: WBAT      Mobility Bed Mobility               General bed mobility comments: OOB in recliner  Transfers Overall transfer level: Needs assistance Equipment used: 1 person hand held assist Transfers: Sit to/from Stand Sit to Stand: Min guard;Supervision Stand pivot transfers: Supervision;Min guard       General transfer comment: increased time and good use of L UE to steady self    Balance                                           ADL either performed or assessed with clinical judgement   ADL                                         General ADL Comments: set up/supervision for adls.  pt uses pants to pull leg up; gave her a gait belt to help cross leg up     Vision         Perception     Praxis       Pertinent Vitals/Pain Pain Assessment: Faces Faces Pain Scale: Hurts little more Pain Location: Rt chest Pain Descriptors / Indicators: Guarding;Discomfort;Sore Pain Intervention(s): Monitored during session     Hand Dominance Right   Extremity/Trunk Assessment Upper Extremity Assessment RUE Deficits / Details: able to lift each arm to 90.  Pt reports she has been doing neck lateral flexing and rotation, A/AAROM FF, wall walks for ABD, ER in gravity eliminated plane           Communication Communication Communication: No difficulties   Cognition Arousal/Alertness: Awake/alert Behavior During Therapy: WFL for tasks assessed/performed Overall Cognitive Status: Within Functional Limits for tasks assessed                                 General Comments: slow to respond at times   General Comments  pt verbalizes that she practices energy conservation:  prioritizes, tries to get everything in one trip, sits for shower and takes rest breaks  Exercises     Shoulder Instructions      Home Living Family/patient expects to be discharged to:: Private residence Living Arrangements: Spouse/significant other Available Help at Discharge: Family;Available PRN/intermittently               Bathroom Shower/Tub: Teacher, early years/pre: Standard     Home Equipment: Clinical cytogeneticist - 2 wheels;Cane - single point;Grab bars - tub/shower          Prior Functioning/Environment Level of Independence: Independent        Comments: mod I adls; pt reports she struggles        OT Problem List: Decreased strength;Decreased knowledge of use of DME or AE;Decreased range of motion;Decreased activity tolerance;Pain      OT Treatment/Interventions: Self-care/ADL training;Therapeutic exercise;Patient/family education;Energy conservation;DME and/or AE instruction;Therapeutic activities    OT Goals(Current goals can be found in the care plan section)  Acute Rehab OT Goals Patient Stated Goal: no specific goals stated by pt, she wants to continue OPPT OT Goal Formulation: With patient Time For Goal Achievement: 05/13/19 Potential to Achieve Goals: Good ADL Goals Pt Will Transfer to Toilet: (P) with modified independence Additional ADL Goal #1: (P) pt will gather clothes and complete adl at mod I level Additional ADL Goal #2: (P) pt will be independent with HEP for RUE  OT Frequency: Min 2X/week   Barriers to D/C:            Co-evaluation              AM-PAC OT "6 Clicks" Daily Activity     Outcome Measure Help from another person eating meals?: None Help from another person taking care of personal grooming?: A Little Help from another person toileting, which includes using toliet, bedpan, or urinal?: A Little Help from another person bathing (including washing, rinsing, drying)?: A Little Help from another person to put on and taking off regular upper body clothing?: A Little Help from another person to put on and taking off regular lower body clothing?: A Little 6 Click Score: 19   End of Session    Activity Tolerance: Patient tolerated treatment well Patient left: in chair;with call bell/phone within reach  OT Visit Diagnosis: Muscle weakness (generalized) (M62.81)                Time: 2111-5520 OT Time Calculation (min): 25 min Charges:  OT General Charges $OT Visit: 1 Visit OT Evaluation $OT Eval Low Complexity: 1 Low OT Treatments $Self Care/Home Management : 8-22 mins  Lesle Chris, OTR/L Acute Rehabilitation Services [phone] [facility] pager [phone] office 04/29/2019  Danilynn Jemison 04/29/2019, 3:31 PM

## 2019-04-29 NOTE — Progress Notes (Signed)
Referring Physician(s): Powell,A/Magrinat,G  Supervising Physician: Sandi Mariscal  Patient Status:  [facility] - In-pt  Chief Complaint:  Metastatic breast cancer, recurrent malignant right pleural effusion, dyspnea  Subjective: Patient familiar to IR service from liver lesion biopsy on 11/26/2018, right thoracentesis on 04/12/2019 yielding 500 cc, on 04/24/2019 yielding 650 cc.  Pleural effusion was malignant and consistent with metastatic breast cancer.  She also underwent right anterior chest wall mass biopsy on 04/24/2019 revealing breast cancer.  Request now received for right Pleurx catheter placement.  Patient continues to have some dyspnea along with right lateral chest discomfort.  She denies fever, headache, abd pain, nausea, vomiting or bleeding.  Past Medical History:  Diagnosis Date  . Cancer ([place])    BREAST CANCER RIGHT BREAST  . Chronic cough   . Family history of breast cancer   . GERD (gastroesophageal reflux disease)   . Hyperlipidemia   . IBS (irritable bowel syndrome)    sees Dr. Silvano Rusk   . Routine gynecological examination    sees Dr. Freda Munro   Past Surgical History:  Procedure Laterality Date  . CESAREAN SECTION    . MASTECTOMY W/ SENTINEL NODE BIOPSY Right 03/04/2019   Procedure: RIGHT MASTECTOMY WITH RIGHT AXILLARY SENTINEL LYMPH NODE BIOPSY;  Surgeon: Rolm Bookbinder, MD;  Location: [facility];  Service: General;  Laterality: Right;  . PORTACATH PLACEMENT Right 11/14/2018   Procedure: INSERTION PORT-A-CATH WITH ULTRASOUND;  Surgeon: Rolm Bookbinder, MD;  Location: [facility];  Service: General;  Laterality: Right;  . ROOT CANAL        Allergies: Barbiturates, Methocarbamol, and Sulfonamide derivatives  Medications: Prior to Admission medications   Medication Sig Start Date End Date Taking? Authorizing Provider  atorvastatin (LIPITOR) 20 MG tablet Take 1 tablet (20 mg total) by mouth daily. 05/31/18  Yes [name], MD  cyclobenzaprine (FLEXERIL) 10 MG tablet Take 1 tablet (10 mg total) by mouth 3 (three) times daily. 03/06/19  Yes Rolm Bookbinder, MD  gabapentin (NEURONTIN) 300 MG capsule Take 1 capsule (300 mg total) by mouth 3 (three) times daily. 04/18/19  Yes Florencia Reasons, MD  nystatin (MYCOSTATIN) 100000 UNIT/ML suspension Take 5 mLs (500,000 Units total) by mouth 4 (four) times daily. 04/18/19  Yes Florencia Reasons, MD  oxyCODONE (OXY IR/ROXICODONE) 5 MG immediate release tablet Take 1 tablet (5 mg total) by mouth every 4 (four) hours as needed for moderate pain. 04/02/19  Yes Magrinat, [name], MD  potassium chloride (K-DUR) 10 MEQ tablet TAKE 1 TABLET BY MOUTH ONCE DAILY Patient taking differently: Take 10 mEq by mouth daily.  11/30/17  Yes [name], MD  loratadine (CLARITIN) 10 MG tablet Take 1 tablet (10 mg total) by mouth daily. Patient not taking: Reported on 04/23/2019 11/15/18   Magrinat, [name], MD  metoprolol succinate (TOPROL-XL) 50 MG 24 hr tablet Take 1 tablet (50 mg total) by mouth daily. Take with or immediately following a meal. Patient not taking: Reported on 04/23/2019 04/09/19   [name], MD  Omeprazole-Sodium Bicarbonate (ZEGERID) 20-1100 MG CAPS capsule Take 1 capsule by mouth daily before breakfast. 05/31/18   [name], MD  polyethylene glycol (MIRALAX / Floria Raveling) packet Take 17 g by mouth as needed. Patient taking differently: Take 17 g by mouth daily as needed (constipation). Mix in 8 oz liquid and drink 04/23/12   [name], MD     Vital Signs: BP (!) 166/95 (BP Location: Right Arm)  Pulse (!) 113   Temp 97.9 F (36.6 C) (Oral)   Resp 20   Ht 5\' 2"  (1.575 m)   Wt 173 lb (78.5 kg)   SpO2 96%   BMI 31.64 kg/m   Physical Exam patient awake, alert; chest with diminished breath sounds right mid to lower lung region, left clear; heart with tachycardic but regular rhythm.  Abdomen soft, positive bowel sounds, nontender.  Extremities with full range of motion.   Imaging: Dg Chest Port 1 View  Result Date: 04/25/2019 CLINICAL DATA:  Fever EXAM: PORTABLE CHEST 1 VIEW COMPARISON:  April 23, 2019 FINDINGS: The mediastinal contour and cardiac silhouette are stable. Right central venous line is unchanged. There is a large right pleural effusion with consolidation of the right mid and lower lung worsened compared prior exam. The left lung is clear. The bony structures are stable. IMPRESSION: Large right pleural effusion with consolidation of the right mid and lower lung worse compared to prior exam. Electronically Signed   By: Abelardo Diesel M.D.   On: 04/25/2019 19:49    Labs:  CBC: Recent Labs    04/25/19 0230 04/26/19 0449 04/27/19 0414 04/29/19 0558  WBC 6.8 5.3 6.0 7.7  HGB 9.0* 10.2* 9.4* 9.5*  HCT 30.1* 35.2* 32.3* 32.1*  PLT 298 367 405* 328    COAGS: Recent Labs    11/25/18 1130 04/13/19 2016  INR 0.9 1.1  APTT  --  36    BMP: Recent Labs    04/25/19 0230 04/26/19 0449 04/27/19 0414 04/29/19 0558  NA 140 140 141 143  K 3.8 4.0 3.9 3.6  CL 104 103 105 107  CO2 26 28 26 26   GLUCOSE 121* 104* 105* 104*  BUN 8 8 8 7   CALCIUM 8.7* 9.7 9.7 11.1*  CREATININE 0.65 0.69 0.72 0.67  GFRNONAA >60 >60 >60 >60  GFRAA >60 >60 >60 >60    LIVER FUNCTION TESTS: Recent Labs    04/25/19 0230 04/26/19 0449 04/27/19 0414 04/29/19 0558  BILITOT 0.3 0.6 0.3 0.4  AST 11* 9* 9* 10*  ALT 24 22 19 19   ALKPHOS 83 85 88 98  PROT 5.7* 6.8 6.6 6.9  ALBUMIN 2.7* 3.1* 2.9* 3.0*    Assessment and Plan: Patient with history of metastatic right breast cancer with recurrent malignant right pleural effusion; request received for right Pleurx catheter placement.  Imaging studies have been reviewed.  Details/risks of procedure, including but not limited to internal bleeding, infection, injury to adjacent structures, inability to adequately drain effusion discussed with patient with her understanding and consent.  Procedure tentatively scheduled  for 8/6.   Electronically Signed: D. Rowe Robert, PA-C 04/29/2019, 6:05 PM   I spent a total of 25 minutes at the the patient's bedside AND on the patient's hospital floor or unit, greater than 50% of which was counseling/coordinating care for right Pleurx catheter placement    Patient ID: Brittany Rodgers, female   DOB: [birth year], 48 y.o.   MRN: [id number]

## 2019-04-29 NOTE — Progress Notes (Signed)
PROGRESS NOTE    Brittany Rodgers  MLY:[id number] DOB: [birth year] DOA: 04/23/2019 PCP: [name], MD   Brief Narrative:  Brittany Rodgers is a 48 y.o. female with medical history significant of metastatic breast cancer s/p mastectomy presents to ED with complaints of increased sob and lethargy. Of note, pt is tired appearing and consequently a poor historian at this time. Per chart review, pt was seen recently in Oncology clinic on 7/28, where pt was noted to have new chest wall mass as well as liver mass. Pt was being considered for possible bipopsy. On day of admit, pt noted to be increasingly sob with tachycardia and was subseqently referred to ED from Oncology clinic.  ED Course: In the ED, pt was noted to have HR in excess of 140's, ultimately improved to the 110's with one dose of IV metoprolol. Pt underwent CT chest with findings worrisome for large R sided effusion. Pt remained on minimal O2 support. IR was consulted by EDP and hospitalist consulted for consideration for admission.  She was admitted with worsening SOB with tachycardia with large R sided effusion.  She had thoracentesis and biopsy of chest wall mass which showed metastatic disease.  Currently planning for pleurx placement on 8/6, then likely d/c with chemotherapy to start on 8/11.  Assessment & Plan:   Principal Problem:   Pleural effusion Active Problems:   IBS   Hyperlipidemia   Chronic right hip pain   Malignant neoplasm of lower-outer quadrant of right breast of female, estrogen receptor negative (HCC)   S/P mastectomy   Pleural effusion on right  # SIRS with concern for sepsis  Presyncope: fever, tachycardia, concerning for infection/sepsis.  Will bolus and start on vanc/cefepime.  She's currently on RA without tachypnea and has a normal WBC count. - Could be related to pleural effusion? Vs UTI with UA with >50 WBC and large LE. - urine cx with multiple species - blood cx NGTD x5, body fluid culture  NG x5 - continue vanc and cefepime -> will narrow to cefepime.  Continue to monitor.  Last fever 7/31 PM.  WBC normal.  - continue to monitor, appears to be improving  1. Malignant Pleural Effusion 1. Currently on minimal O2 support 2. CT with moderate to large R pleural effusion with associated atelectasis or consolidation 3. Now s/p thoracentesis 4. Follow culture, gram stain without organism.  Follow cytology (with malignant cells consistent with metastatic adenocarcinoma).  Acid fast smear and culture pending.  Exudative by lights. 5.   CXR 7/31 with large R pleural effusion with consolidation of R mid and lower lung (worse compared to prior exam).  Pt will likely benefit from pleurx during this admission.  Consult for pleurx by IR placed, plan for 8/6.   2. Stage IV Breast cancer, s/p mastectomy 1. Followed by [name] 2. New chest wall mass and liver lesion noted recently 3. Pt also has mutliple small bilateral pulm nodules, new or enlarged from prior exam concerning for metastatic disease (vs infection) 4. Appreciate [name] assistance, need pathologic confirmation of stage IV disease, awaiting cytology from thoracentesis (as above) and pt now s/p bx (with invasive ductal carcinoma - caris study results pending) of chest wall mass -> planning for chemo on August 11  5. Continue pain medications 6. Appreciate palliative care for assistance with [place] conversations  3. HLD 1. Seems stable. 2. Cont statin per home regimen  4. Sinus tach 1. Tachy today, will bolus and start IVF  and monitor 2. Continue metoprolol  5. Hx IBS 1. Seems stable at this time  6. Chronic pain 1. Will continue home pain regimen -> now adjusted to include MSIR and MS contin  # Hypercalcemia: follow with IVF  DVT prophylaxis: lovenox Code Status: full  Family Communication: discussed with husband 8/3 Disposition Plan: pending further w/u and imrpovement   Consultants:   IR  Oncology   Palliative care  Procedures:  CT guided core bx R anterior chest wall mass 7/30 US thoracentesis 7/30  Antimicrobials:  Anti-infectives (From admission, onward)   Start     Dose/Rate Route Frequency Ordered Stop   04/25/19 0400  vancomycin (VANCOCIN) IVPB 750 mg/150 ml premix  Status:  Discontinued     750 mg 150 mL/hr over 60 Minutes Intravenous Every 12 hours 04/24/19 1332 04/25/19 1447   04/24/19 2200  ceFEPIme (MAXIPIME) 2 g in sodium chloride 0.9 % 100 mL IVPB     2 g 200 mL/hr over 30 Minutes Intravenous Every 8 hours 04/24/19 1309     04/24/19 1315  ceFEPIme (MAXIPIME) 2 g in sodium chloride 0.9 % 100 mL IVPB     2 g 200 mL/hr over 30 Minutes Intravenous STAT 04/24/19 1307 04/24/19 1536   04/24/19 1315  vancomycin (VANCOCIN) 1,750 mg in sodium chloride 0.9 % 500 mL IVPB     1,750 mg 250 mL/hr over 120 Minutes Intravenous STAT 04/24/19 1307 04/24/19 1915     Subjective: No complaints today Doesn't feel to baseline Hoping to get pleurx during this admission  Objective: Vitals:   04/28/19 2100 04/28/19 2230 04/29/19 0522 04/29/19 1412  BP: (!) 155/93  (!) 157/99 (!) 166/95  Pulse: (!) 114 (!) 119 (!) 113   Resp:   17 20  Temp:   98 F (36.7 C) 97.9 F (36.6 C)  TempSrc:    Oral  SpO2: 94% 92% 95% 96%  Weight:      Height:        Intake/Output Summary (Last 24 hours) at 04/29/2019 1729 Last data filed at 04/29/2019 0800 Gross per 24 hour  Intake 460 ml  Output -  Net 460 ml   Filed Weights   04/24/19 0003  Weight: 78.5 kg    Examination:  General: No acute distress. Cardiovascular: Heart sounds show a tachy rate, and rhythm. Lungs: Clear to auscultation bilaterally  Abdomen: Soft, nontender, nondistended Neurological: Alert and oriented 3. Moves all extremities 4. Cranial nerves II through XII grossly intact. Skin: Warm and dry. No rashes or lesions. Extremities: No clubbing or cyanosis. No edema.    Data Reviewed: I have personally reviewed  following labs and imaging studies  CBC: Recent Labs  Lab 04/23/19 0834 04/23/19 1309 04/24/19 0453 04/25/19 0230 04/26/19 0449 04/27/19 0414 04/29/19 0558  WBC 9.7 10.1 7.5 6.8 5.3 6.0 7.7  NEUTROABS 7.4 7.2  --   --   --   --   --   HGB 11.3* 10.8* 9.4* 9.0* 10.2* 9.4* 9.5*  HCT 37.8 35.6* 32.4* 30.1* 35.2* 32.3* 32.1*  MCV 86.3 87.5 88.8 89.3 87.8 88.0 87.7  PLT 383 364 319 298 367 405* 378   Basic Metabolic Panel: Recent Labs  Lab 04/24/19 0453 04/25/19 0230 04/26/19 0449 04/27/19 0414 04/29/19 0558  NA 140 140 140 141 143  K 3.9 3.8 4.0 3.9 3.6  CL 104 104 103 105 107  CO2 27 26 28 26 26   GLUCOSE 107* 121* 104* 105* 104*  BUN 10 8 8  8 7  CREATININE 0.68 0.65 0.69 0.72 0.67  CALCIUM 9.4 8.7* 9.7 9.7 11.1*  MG  --  1.6* 1.9 1.9 1.8   GFR: Estimated Creatinine Clearance: 82.6 mL/min (by C-G formula based on SCr of 0.67 mg/dL). Liver Function Tests: Recent Labs  Lab 04/24/19 0453 04/25/19 0230 04/26/19 0449 04/27/19 0414 04/29/19 0558  AST 10* 11* 9* 9* 10*  ALT 28 24 22 19 19   ALKPHOS 95 83 85 88 98  BILITOT 0.4 0.3 0.6 0.3 0.4  PROT 6.4* 5.7* 6.8 6.6 6.9  ALBUMIN 3.1* 2.7* 3.1* 2.9* 3.0*   No results for input(s): LIPASE, AMYLASE in the last 168 hours. No results for input(s): AMMONIA in the last 168 hours. Coagulation Profile: No results for input(s): INR, PROTIME in the last 168 hours. Cardiac Enzymes: No results for input(s): CKTOTAL, CKMB, CKMBINDEX, TROPONINI in the last 168 hours. BNP (last 3 results) No results for input(s): PROBNP in the last 8760 hours. HbA1C: No results for input(s): HGBA1C in the last 72 hours. CBG: No results for input(s): GLUCAP in the last 168 hours. Lipid Profile: No results for input(s): CHOL, HDL, LDLCALC, TRIG, CHOLHDL, LDLDIRECT in the last 72 hours. Thyroid Function Tests: No results for input(s): TSH, T4TOTAL, FREET4, T3FREE, THYROIDAB in the last 72 hours. Anemia Panel: No results for input(s):  VITAMINB12, FOLATE, FERRITIN, TIBC, IRON, RETICCTPCT in the last 72 hours. Sepsis Labs: Recent Labs  Lab 04/24/19 1650 04/24/19 1932 04/25/19 0230 04/26/19 0449  PROCALCITON  --   --  <0.10 <0.10  LATICACIDVEN 2.1* 1.9  --   --     Recent Results (from the past 240 hour(s))  SARS Coronavirus 2 (CEPHEID - Performed in [facility] hospital lab), Hosp Order     Status: None   Collection Time: 04/23/19  1:06 PM   Specimen: Nasopharyngeal Swab  Result Value Ref Range Status   SARS Coronavirus 2 NEGATIVE NEGATIVE Final    Comment: (NOTE) If result is NEGATIVE SARS-CoV-2 target nucleic acids are NOT DETECTED. The SARS-CoV-2 RNA is generally detectable in upper and lower  respiratory specimens during the acute phase of infection. The lowest  concentration of SARS-CoV-2 viral copies this assay can detect is 250  copies / mL. A negative result does not preclude SARS-CoV-2 infection  and should not be used as the sole basis for treatment or other  patient management decisions.  A negative result may occur with  improper specimen collection / handling, submission of specimen other  than nasopharyngeal swab, presence of viral mutation(s) within the  areas targeted by this assay, and inadequate number of viral copies  (<250 copies / mL). A negative result must be combined with clinical  observations, patient history, and epidemiological information. If result is POSITIVE SARS-CoV-2 target nucleic acids are DETECTED. The SARS-CoV-2 RNA is generally detectable in upper and lower  respiratory specimens dur ing the acute phase of infection.  Positive  results are indicative of active infection with SARS-CoV-2.  Clinical  correlation with patient history and other diagnostic information is  necessary to determine patient infection status.  Positive results do  not rule out bacterial infection or co-infection with other viruses. If result is PRESUMPTIVE POSTIVE SARS-CoV-2 nucleic acids MAY BE  PRESENT.   A presumptive positive result was obtained on the submitted specimen  and confirmed on repeat testing.  While 2019 novel coronavirus  (SARS-CoV-2) nucleic acids may be present in the submitted sample  additional confirmatory testing may be necessary for epidemiological  and / or clinical management purposes  to differentiate between  SARS-CoV-2 and other Sarbecovirus currently known to infect humans.  If clinically indicated additional testing with an alternate test  methodology [phone]) is advised. The SARS-CoV-2 RNA is generally  detectable in upper and lower respiratory sp ecimens during the acute  phase of infection. The expected result is Negative. Fact Sheet for Patients:  StrictlyIdeas.no Fact Sheet for Healthcare Providers: BankingDealers.co.za This test is not yet approved or cleared by the [place] FDA and has been authorized for detection and/or diagnosis of SARS-CoV-2 by FDA under an Emergency Use Authorization (EUA).  This EUA will remain in effect (meaning this test can be used) for the duration of the COVID-19 declaration under Section 564(b)(1) of the Act, 21 U.S.C. section 360bbb-3(b)(1), unless the authorization is terminated or revoked sooner. Performed at [facility], [place] [address], [place], [place] [address]   Acid Fast Smear (AFB)     Status: None   Collection Time: 04/24/19 10:37 AM   Specimen: Pleural, Right; Pleural Fluid  Result Value Ref Range Status   AFB Specimen Processing Concentration  Final   Acid Fast Smear Negative  Final    Comment: (NOTE) Performed At: [facility] [place], [place] [id number] Rush Farmer MD [id number]    Source (AFB) PLEURAL  Final    Comment: RT Performed at [facility], [place] [address] [place], [place] [address]   Culture, body fluid-bottle     Status: None   Collection Time: 04/24/19  10:37 AM   Specimen: Pleura  Result Value Ref Range Status   Specimen Description PLEURAL RIGHT  Final   Special Requests NONE  Final   Culture   Final    NO GROWTH 5 DAYS Performed at [facility], [address] [place], [place] [address]    Report Status 04/29/2019 FINAL  Final  Gram stain     Status: None   Collection Time: 04/24/19 10:37 AM   Specimen: Pleura  Result Value Ref Range Status   Specimen Description PLEURAL RIGHT  Final   Special Requests NONE  Final   Gram Stain   Final    FEW WBC PRESENT, PREDOMINANTLY MONONUCLEAR NO ORGANISMS SEEN Performed at [facility], [place] [address] [place], [place] [address]    Report Status 04/24/2019 FINAL  Final  Culture, blood (routine x 2)     Status: None   Collection Time: 04/24/19  1:35 PM   Specimen: BLOOD LEFT HAND  Result Value Ref Range Status   Specimen Description   Final    BLOOD LEFT HAND Performed at [facility] [address], [place], [place] [address]    Special Requests   Final    BOTTLES DRAWN AEROBIC ONLY Blood Culture adequate volume   Culture   Final    NO GROWTH 5 DAYS Performed at [facility], [place] [address] [place], [place] [address]    Report Status 04/29/2019 FINAL  Final  Culture, blood (routine x 2)     Status: None   Collection Time: 04/24/19  1:35 PM   Specimen: BLOOD LEFT HAND  Result Value Ref Range Status   Specimen Description   Final    BLOOD LEFT HAND Performed at [facility] [address] [place], [place] [address]    Special Requests   Final    BOTTLES DRAWN AEROBIC ONLY Blood Culture results may not be  optimal due to an inadequate volume of blood received in culture bottles Performed at [facility] [address] [place], [place] [address]    Culture   Final    NO GROWTH 5 DAYS Performed at [facility], [place] [address] [place], [place] [address]    Report Status 04/29/2019 FINAL   Final  Culture, Urine     Status: Abnormal   Collection Time: 04/24/19  4:50 PM   Specimen: Urine, Random  Result Value Ref Range Status   Specimen Description   Final    URINE, RANDOM Performed at [facility] [address], [place], [place] [address]    Special Requests   Final    NONE Performed at [facility], [place] [address], [place], [place] [address]    Culture MULTIPLE SPECIES PRESENT, SUGGEST RECOLLECTION (A)  Final   Report Status 04/25/2019 FINAL  Final         Radiology Studies: No results found.      Scheduled Meds: . atorvastatin  20 mg Oral Daily  . Chlorhexidine Gluconate Cloth  6 each Topical Daily  . docusate sodium  200 mg Oral BID  . enoxaparin (LOVENOX) injection  40 mg Subcutaneous Q24H  . metoprolol tartrate  25 mg Oral BID  . morphine  15 mg Oral Q12H  . polyethylene glycol  17 g Oral BID  . senna-docusate  2 tablet Oral BID   Continuous Infusions: . sodium chloride Stopped (04/27/19 2248)  . ceFEPime (MAXIPIME) IV 2 g (04/29/19 1519)  . sodium chloride       LOS: 6 days    Time spent: over 30 min    Fayrene Helper, MD [facility] Pager AMION  If 7PM-7AM, please contact night-coverage [email or web address] Password [email or web address] 04/29/2019, 5:29 PM

## 2019-04-29 NOTE — Progress Notes (Signed)
Physical Therapy Treatment Patient Details Name: Brittany Rodgers MRN: [id number] DOB: 1970/11/04 Today's Date: 04/29/2019    History of Present Illness Brittany Rodgers is a 48 y.o. female with medical history significant of metastatic breast cancer s/p mastectomy. She presented to ED on 04/23/19 with complaints of increased sob and lethargy.    PT Comments    Pt OOB in recliner.  Assisted with amb.  General Gait Details: decreased amb distance this session due to increased c/o fatigue.  Had just got back from bathroom.  Feels "wore out". Light assist steady self using IV pole. General transfer comment: increased time and good use of L UE to steady self.   Pt plans to return home and resume OP PT for R UE ROM/TE's s/p Mastectomy   Follow Up Recommendations  Outpatient PT(resume)     Equipment Recommendations       Recommendations for Other Services       Precautions / Restrictions Precautions Precautions: Fall Precaution Comments: R recent mastectomy Restrictions Weight Bearing Restrictions: No Other Position/Activity Restrictions: WBAT    Mobility  Bed Mobility               General bed mobility comments: OOB in recliner  Transfers Overall transfer level: Needs assistance Equipment used: 1 person hand held assist Transfers: Sit to/from Stand Sit to Stand: Min guard;Supervision Stand pivot transfers: Supervision;Min guard       General transfer comment: increased time and good use of L UE to steady self  Ambulation/Gait Ambulation/Gait assistance: Min guard Gait Distance (Feet): 45 Feet Assistive device: IV Pole Gait Pattern/deviations: Step-through pattern;Decreased stride length Gait velocity: decreased   General Gait Details: decreased amb distance this session due to increased c/o fatigue.  Had just got back from bathroom.  Feels "wore out". Light assist steady self using IV pole.   Stairs             Wheelchair Mobility    Modified  Rankin (Stroke Patients Only)       Balance                                            Cognition Arousal/Alertness: Awake/alert Behavior During Therapy: WFL for tasks assessed/performed Overall Cognitive Status: Within Functional Limits for tasks assessed                                 General Comments: slow to respond at times      Exercises      General Comments        Pertinent Vitals/Pain Pain Assessment: Faces Faces Pain Scale: Hurts little more Pain Location: Rt chest Pain Descriptors / Indicators: Guarding;Discomfort;Sore Pain Intervention(s): Monitored during session    Home Living Family/patient expects to be discharged to:: Private residence Living Arrangements: Spouse/significant other Available Help at Discharge: Family;Available PRN/intermittently         Home Equipment: Shower seat;Walker - 2 wheels;Cane - single point;Grab bars - tub/shower      Prior Function            PT Goals (current goals can now be found in the care plan section) Progress towards PT goals: Progressing toward goals    Frequency    Min 3X/week      PT Plan Current plan remains appropriate  Co-evaluation              AM-PAC PT "6 Clicks" Mobility   Outcome Measure  Help needed turning from your back to your side while in a flat bed without using bedrails?: A Little Help needed moving from lying on your back to sitting on the side of a flat bed without using bedrails?: A Little Help needed moving to and from a bed to a chair (including a wheelchair)?: A Little Help needed standing up from a chair using your arms (e.g., wheelchair or bedside chair)?: A Little Help needed to walk in hospital room?: A Little Help needed climbing 3-5 steps with a railing? : A Little 6 Click Score: 18    End of Session   Activity Tolerance: Patient limited by fatigue Patient left: in chair;with call bell/phone within reach Nurse  Communication: Mobility status PT Visit Diagnosis: Muscle weakness (generalized) (M62.81);Difficulty in walking, not elsewhere classified (R26.2)     Time: 4888-9169 PT Time Calculation (min) (ACUTE ONLY): 12 min  Charges:  $Gait Training: 8-22 mins                     {Kimla Furth  PTA Acute  Rehabilitation Services Pager      [phone] Office      [phone]

## 2019-04-30 ENCOUNTER — Telehealth: Payer: Self-pay | Admitting: Oncology

## 2019-04-30 ENCOUNTER — Other Ambulatory Visit: Payer: Self-pay | Admitting: Radiology

## 2019-04-30 DIAGNOSIS — G8929 Other chronic pain: Secondary | ICD-10-CM

## 2019-04-30 DIAGNOSIS — R000 Tachycardia, unspecified: Secondary | ICD-10-CM

## 2019-04-30 DIAGNOSIS — M25551 Pain in right hip: Secondary | ICD-10-CM

## 2019-04-30 DIAGNOSIS — J9611 Chronic respiratory failure with hypoxia: Secondary | ICD-10-CM

## 2019-04-30 LAB — COMPREHENSIVE METABOLIC PANEL
ALT: 19 U/L (ref 0–44)
AST: 10 U/L — ABNORMAL LOW (ref 15–41)
Albumin: 3.2 g/dL — ABNORMAL LOW (ref 3.5–5.0)
Alkaline Phosphatase: 105 U/L (ref 38–126)
Anion gap: 8 (ref 5–15)
BUN: 7 mg/dL (ref 6–20)
CO2: 26 mmol/L (ref 22–32)
Calcium: 11.2 mg/dL — ABNORMAL HIGH (ref 8.9–10.3)
Chloride: 109 mmol/L (ref 98–111)
Creatinine, Ser: 0.62 mg/dL (ref 0.44–1.00)
GFR calc Af Amer: >60 mL/min (ref >60)
GFR calc non Af Amer: >60 mL/min (ref >60)
Glucose, Bld: 105 mg/dL — ABNORMAL HIGH (ref 70–99)
Potassium: 3.9 mmol/L (ref 3.5–5.1)
Sodium: 143 mmol/L (ref 135–145)
Total Bilirubin: 0.4 mg/dL (ref 0.3–1.2)
Total Protein: 7.2 g/dL (ref 6.5–8.1)

## 2019-04-30 LAB — MAGNESIUM: Magnesium: 1.9 mg/dL (ref 1.7–2.4)

## 2019-04-30 LAB — CBC
HCT: 33.8 % — ABNORMAL LOW (ref 36.0–46.0)
Hemoglobin: 9.7 g/dL — ABNORMAL LOW (ref 12.0–15.0)
MCH: 25.3 pg — ABNORMAL LOW (ref 26.0–34.0)
MCHC: 28.7 g/dL — ABNORMAL LOW (ref 30.0–36.0)
MCV: 88 fL (ref 80.0–100.0)
Platelets: 347 10*3/uL (ref 150–400)
RBC: 3.84 MIL/uL — ABNORMAL LOW (ref 3.87–5.11)
RDW: 18.2 % — ABNORMAL HIGH (ref 11.5–15.5)
WBC: 7.6 10*3/uL (ref 4.0–10.5)
nRBC: 0 % (ref 0.0–0.2)

## 2019-04-30 MED ORDER — SODIUM CHLORIDE 0.9 % IV SOLN
90.0000 mg | Freq: Once | INTRAVENOUS | Status: AC
  Administered 2019-04-30: 15:00:00 90 mg via INTRAVENOUS
  Filled 2019-04-30: qty 10

## 2019-04-30 NOTE — Progress Notes (Signed)
Pharmacy Antibiotic Note  Brittany Rodgers is a 48 y.o. female admitted on 04/23/2019 with sepsis/malignant pleural effusion.  Pharmacy has been consulted for cefepime dosing.  Plan: Continue Cefepime 2 gm IV q8h  - plan PleurX placement 8/6 > home, Chemo 8/11   Height: 5\' 2"  (157.5 cm) Weight: 173 lb (78.5 kg) IBW/kg (Calculated) : 50.1  Temp (24hrs), Avg:98.1 F (36.7 C), Min:97.9 F (36.6 C), Max:98.7 F (37.1 C)  Recent Labs  Lab 04/24/19 1650 04/24/19 1932 04/25/19 0230 04/26/19 0449 04/27/19 0414 04/29/19 0558 04/30/19 0352  WBC  --   --  6.8 5.3 6.0 7.7 7.6  CREATININE  --   --  0.65 0.69 0.72 0.67 0.62  LATICACIDVEN 2.1* 1.9  --   --   --   --   --     Estimated Creatinine Clearance: 82.6 mL/min (by C-G formula based on SCr of 0.62 mg/dL).    Allergies  Allergen Reactions  . Barbiturates Other (See Comments)    Keeps pt awake  . Methocarbamol Itching  . Sulfonamide Derivatives Itching   Antimicrobials this admission: 7/30 Vancomycin >>7/31 7/30 Cefepime >>  Dose adjustments this admission:-   Microbiology results: 8/4 Covid send-out: sent  7/30 Pleural fluid AFB/Cx: neg/sent 7/30 pleural fluid cx: ng-final 7/30 BCx: ng-final 7/30 UCx: mulp sp, final 7/29 COVID: neg 7/20 MRSA PCR neg  Thank you for allowing pharmacy to be a part of this patient's care.  Minda Ditto PharmD Pager [phone] 04/30/2019, 1:52 PM

## 2019-04-30 NOTE — Telephone Encounter (Signed)
I left a message regarding 8/12

## 2019-04-30 NOTE — Progress Notes (Signed)
Patient showing 2 on mews. This is not an acute change for the patient. Patient is tachycardic. She is on scheduled metoprolol, cardiac monitoring, & the doctor is aware.

## 2019-04-30 NOTE — Progress Notes (Addendum)
PROGRESS NOTE    Brittany Rodgers  VWU:[id number] DOB: 1971/03/29 DOA: 04/23/2019 PCP: [name], MD   Brief Narrative:  Brittany Rodgers is a 48 y.o. female with medical history significant of metastatic breast cancer s/p mastectomy, presented to ED with complaints of increased shortness of breath and lethargy. Patient was seen recently in Oncology clinic on 7/28, where pt was noted to have new chest wall mass as well as liver mass. Pt was being considered for possible bipopsy. On day of admit, pt noted to be increasingly sob with tachycardia and was subseqently referred to ED from Oncology clinic.  In the ED, pt was noted to have HR in excess of 140's, ultimately improved to the 110's with one dose of IV metoprolol. Pt underwent CT chest with findings worrisome for large R sided effusion. Pt remained on minimal O2 support. IR was consulted by EDP and hospitalist consulted for consideration for admission.  Patient had thoracentesis and biopsy of chest wall mass which showed metastatic disease.  Currently planning for pleurx placement on 8/6, then likely d/c with chemotherapy to start on 8/11.  Assessment & Plan:   Principal Problem:   Pleural effusion Active Problems:   IBS   Hyperlipidemia   Chronic right hip pain   Malignant neoplasm of lower-outer quadrant of right breast of female, estrogen receptor negative (HCC)   S/P mastectomy   Pleural effusion on right  SIRS with concern for sepsis  Presyncope: on cefepime.  Urinalysis was abnormal but urine culture showed multiple species.  Blood cultures were negative.  Pleural fluid cultures were negative as well. Off vancomycin.  We will continue to monitor.  Afebrile at this time.  WBC count of 7.6.   Malignant Pleural Effusion CT with moderate to large R pleural effusion with associated atelectasis or consolidation, s/p thoracentesis.  Pleural fluid culture without any organisms.  Cytology consistent with metastatic  adenocarcinoma.  Awaiting for Pleurx catheter placement.  Stage IV Breast cancer, s/p mastectomy Followed by [name], oncology as outpatient. new chest wall mass and liver lesion with mutliple small bilateral pulm nodules, new or enlarged from prior exam concerning for metastatic disease. 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  Palliative care on board.  Hyperlipidemia.  Continue statin  Sinus tachycardia Closely monitor.  Continue metoprolol.  Chronic pain On MSIR and MS contin  Hypercalcemia: Likely secondary to malignancy.  We will monitor closely with IV fluids.  Check BMP in a.m. spoke with [name] hemato-oncology.  He recommends pamidronate 90 mg x 1 today.  Debility, weakness.  Seen by physical therapy, occupational therapy.  Recommend home health/outpatient PT on discharge.  DVT prophylaxis: Lovenox  Code Status: Full code  Family Communication: I tried to reach the patient's spouse Johnny Scriven on the phone today to update him about the clinical condition of the patient but was unable to reach him on the cell phone and home phone listed in the computer.  Disposition Plan: Home with outpatient PT OT.  Awaiting for Pleurx catheter placement tomorrow.  Check calcium levels in a.m.  Consultants:   IR  Oncology  Palliative care  Procedures:  CT guided core bx Right anterior chest wall mass 7/30 US thoracentesis 7/30  Antimicrobials:  Anti-infectives (From admission, onward)   Start  Dose/Rate Route Frequency Ordered Stop   04/25/19 0400  vancomycin (VANCOCIN) IVPB 750 mg/150 ml premix  Status:  Discontinued     750 mg 150 mL/hr over 60 Minutes Intravenous Every 12 hours 04/24/19 1332 04/25/19 1447   04/24/19 2200  ceFEPIme (MAXIPIME) 2 g in sodium  chloride 0.9 % 100 mL IVPB     2 g 200 mL/hr over 30 Minutes Intravenous Every 8 hours 04/24/19 1309 05/01/19 2159   04/24/19 1315  ceFEPIme (MAXIPIME) 2 g in sodium chloride 0.9 % 100 mL IVPB     2 g 200 mL/hr over 30 Minutes Intravenous STAT 04/24/19 1307 04/24/19 1536   04/24/19 1315  vancomycin (VANCOCIN) 1,750 mg in sodium chloride 0.9 % 500 mL IVPB     1,750 mg 250 mL/hr over 120 Minutes Intravenous STAT 04/24/19 1307 04/24/19 1915     Subjective: Today, states that her breathing is okay but has been having some cough in constipation for the last 2 days.  Complains of mild chest discomfort.  Objective: Vitals:   04/29/19 0522 04/29/19 1412 04/29/19 2144 04/30/19 0450  BP: (!) 157/99 (!) 166/95 (!) 159/96 (!) 154/99  Pulse: (!) 113  (!) 108 (!) 109  Resp: 17 20 19 17   Temp: 98 F (36.7 C) 97.9 F (36.6 C) 97.9 F (36.6 C) 97.9 F (36.6 C)  TempSrc:  Oral Oral Oral  SpO2: 95% 96% 100% 100%  Weight:      Height:        Intake/Output Summary (Last 24 hours) at 04/30/2019 0755 Last data filed at 04/29/2019 0800 Gross per 24 hour  Intake 360 ml  Output -  Net 360 ml   Filed Weights   04/24/19 0003  Weight: 78.5 kg    Examination: General: Alert awake and communicative, not in obvious distress, on nasal cannula.  Obese. HENT: Normocephalic, pupils equally reacting to light and accommodation.  No scleral pallor or icterus noted. Oral mucosa is moist.  Chest: Right mastectomy with scar and dressing.  Right chest wall Port-A-Cath in place.  Diminished breath sounds bilaterally.  No obvious crackles or wheezes noted. CVS: S1 &S2 heard. No murmur.  Regular rate and rhythm. Abdomen: Soft, nontender, nondistended.  Bowel sounds are heard.  Liver is not palpable, no abdominal mass palpated Extremities: No cyanosis, clubbing or edema.  Peripheral pulses are palpable. Psych: Alert, awake and oriented, CNS:  No cranial nerve deficits.  Power equal in all extremities.  No sensory  deficits noted.  No cerebellar signs.   Skin: Warm and dry.  Right chest wall scar.  Data Reviewed: I have personally reviewed following labs and imaging studies  CBC: Recent Labs  Lab 04/23/19 0834 04/23/19 1309  04/25/19 0230 04/26/19 0449 04/27/19 0414 04/29/19 0558 04/30/19 0352  WBC 9.7 10.1   < > 6.8 5.3 6.0 7.7 7.6  NEUTROABS 7.4 7.2  --   --   --   --   --   --   HGB 11.3* 10.8*   < > 9.0* 10.2* 9.4* 9.5* 9.7*  HCT 37.8 35.6*   < > 30.1* 35.2* 32.3* 32.1* 33.8*  MCV 86.3 87.5   < > 89.3 87.8 88.0 87.7 88.0  PLT 383 364   < > 298 367 405* 328 347   < > = values in this interval not displayed.   Basic Metabolic Panel: Recent Labs  Lab 04/25/19 0230 04/26/19 0449 04/27/19 0414 04/29/19 0558 04/30/19 0352  NA 140 140 141 143 143  K 3.8 4.0 3.9 3.6 3.9  CL 104 103 105 107 109  CO2 26 28 26 26 26   GLUCOSE 121* 104* 105* 104* 105*  BUN 8 8 8 7 7   CREATININE 0.65 0.69 0.72 0.67 0.62  CALCIUM 8.7* 9.7 9.7 11.1* 11.2*  MG 1.6* 1.9 1.9 1.8 1.9   GFR: Estimated Creatinine Clearance: 82.6 mL/min (by C-G formula based on SCr of 0.62 mg/dL). Liver Function Tests: Recent Labs  Lab 04/25/19 0230 04/26/19 0449 04/27/19 0414 04/29/19 0558 04/30/19 0352  AST 11* 9* 9* 10* 10*  ALT 24 22 19 19 19   ALKPHOS 83 85 88 98 105  BILITOT 0.3 0.6 0.3 0.4 0.4  PROT 5.7* 6.8 6.6 6.9 7.2  ALBUMIN 2.7* 3.1* 2.9* 3.0* 3.2*   No results for input(s): LIPASE, AMYLASE in the last 168 hours. No results for input(s): AMMONIA in the last 168 hours. Coagulation Profile: No results for input(s): INR, PROTIME in the last 168 hours. Cardiac Enzymes: No results for input(s): CKTOTAL, CKMB, CKMBINDEX, TROPONINI in the last 168 hours. BNP (last 3 results) No results for input(s): PROBNP in the last 8760 hours. HbA1C: No results for input(s): HGBA1C in the last 72 hours. CBG: No results for input(s): GLUCAP in the last 168 hours. Lipid Profile: No results for input(s): CHOL, HDL,  LDLCALC, TRIG, CHOLHDL, LDLDIRECT in the last 72 hours. Thyroid Function Tests: No results for input(s): TSH, T4TOTAL, FREET4, T3FREE, THYROIDAB in the last 72 hours. Anemia Panel: No results for input(s): VITAMINB12, FOLATE, FERRITIN, TIBC, IRON, RETICCTPCT in the last 72 hours. Sepsis Labs: Recent Labs  Lab 04/24/19 1650 04/24/19 1932 04/25/19 0230 04/26/19 0449  PROCALCITON  --   --  <0.10 <0.10  LATICACIDVEN 2.1* 1.9  --   --     Recent Results (from the past 240 hour(s))  SARS Coronavirus 2 (CEPHEID - Performed in [place] hospital lab), Hosp Order     Status: None   Collection Time: 04/23/19  1:06 PM   Specimen: Nasopharyngeal Swab  Result Value Ref Range Status   SARS Coronavirus 2 NEGATIVE NEGATIVE Final    Comment: (NOTE) If result is NEGATIVE SARS-CoV-2 target nucleic acids are NOT DETECTED. The SARS-CoV-2 RNA is generally detectable in upper and lower  respiratory specimens during the acute phase of infection. The lowest  concentration of SARS-CoV-2 viral copies this assay can detect is 250  copies / mL. A negative result does not preclude SARS-CoV-2 infection  and should not be used as the sole basis for treatment or other  patient management decisions.  A negative result may occur with  improper specimen collection / handling, submission of specimen other  than nasopharyngeal swab, presence of viral mutation(s) within the  areas targeted by this assay, and inadequate number of viral copies  (<250 copies / mL). A negative result must be combined with clinical  observations, patient history, and epidemiological information. If result is POSITIVE SARS-CoV-2 target nucleic acids are DETECTED. The SARS-CoV-2 RNA is generally detectable in upper and lower  respiratory specimens dur ing the acute phase of infection.  Positive  results are indicative of active infection with SARS-CoV-2.  Clinical  correlation with patient history and other diagnostic information is   necessary to determine patient infection status.  Positive results do  not rule out bacterial infection or co-infection with other viruses. If result is PRESUMPTIVE POSTIVE SARS-CoV-2 nucleic acids MAY BE PRESENT.   A presumptive positive result was obtained on the submitted specimen  and  confirmed on repeat testing.  While 2019 novel coronavirus  (SARS-CoV-2) nucleic acids may be present in the submitted sample  additional confirmatory testing may be necessary for epidemiological  and / or clinical management purposes  to differentiate between  SARS-CoV-2 and other Sarbecovirus currently known to infect humans.  If clinically indicated additional testing with an alternate test  methodology [phone]) is advised. The SARS-CoV-2 RNA is generally  detectable in upper and lower respiratory sp ecimens during the acute  phase of infection. The expected result is Negative. Fact Sheet for Patients:  StrictlyIdeas.no Fact Sheet for Healthcare Providers: BankingDealers.co.za This test is not yet approved or cleared by the [place] FDA and has been authorized for detection and/or diagnosis of SARS-CoV-2 by FDA under an Emergency Use Authorization (EUA).  This EUA will remain in effect (meaning this test can be used) for the duration of the COVID-19 declaration under Section 564(b)(1) of the Act, 21 U.S.C. section 360bbb-3(b)(1), unless the authorization is terminated or revoked sooner. Performed at [facility], [place] [address] [place], [place] [address]   Acid Fast Smear (AFB)     Status: None   Collection Time: 04/24/19 10:37 AM   Specimen: Pleural, Right; Pleural Fluid  Result Value Ref Range Status   AFB Specimen Processing Concentration  Final   Acid Fast Smear Negative  Final    Comment: (NOTE) Performed At: [facility] [place], [place] [id number] Rush Farmer MD AS:[id number]    Source  (AFB) PLEURAL  Final    Comment: RT Performed at [facility], [place] [address], [place], [place] [address]   Culture, body fluid-bottle     Status: None   Collection Time: 04/24/19 10:37 AM   Specimen: Pleura  Result Value Ref Range Status   Specimen Description PLEURAL RIGHT  Final   Special Requests NONE  Final   Culture   Final    NO GROWTH 5 DAYS Performed at [facility], [address], [place], [place] [address]    Report Status 04/29/2019 FINAL  Final  Gram stain     Status: None   Collection Time: 04/24/19 10:37 AM   Specimen: Pleura  Result Value Ref Range Status   Specimen Description PLEURAL RIGHT  Final   Special Requests NONE  Final   Gram Stain   Final    FEW WBC PRESENT, PREDOMINANTLY MONONUCLEAR NO ORGANISMS SEEN Performed at [facility], [place] [address], [place], [place] [address]    Report Status 04/24/2019 FINAL  Final  Culture, blood (routine x 2)     Status: None   Collection Time: 04/24/19  1:35 PM   Specimen: BLOOD LEFT HAND  Result Value Ref Range Status   Specimen Description   Final    BLOOD LEFT HAND Performed at [place] [address] [place], [place] [address]    Special Requests   Final    BOTTLES DRAWN AEROBIC ONLY Blood Culture adequate volume   Culture   Final    NO GROWTH 5 DAYS Performed at [facility], [place] [address], [place],  [address]    Report Status 04/29/2019 FINAL  Final  Culture, blood (routine x 2)     Status: None   Collection Time: 04/24/19  1:35 PM   Specimen: BLOOD LEFT HAND  Result Value Ref Range Status   Specimen Description   Final    BLOOD LEFT HAND Performed at [facility], [place] [address], [place], [place] [address]    Special Requests   Final    BOTTLES DRAWN AEROBIC ONLY Blood Culture results may not be optimal due to an inadequate volume of blood received in culture bottles Performed at [place] [address] [place], [place] [address]    Culture   Final    NO GROWTH 5 DAYS Performed at [facility], [place] [address] [place], [place] [address]    Report Status 04/29/2019 FINAL  Final  Culture, Urine     Status: Abnormal   Collection Time: 04/24/19  4:50 PM   Specimen: Urine, Random  Result Value Ref Range Status   Specimen Description   Final    URINE, RANDOM Performed at [place] [address] [place], [place] [address]    Special Requests   Final    NONE Performed at [facility], [place] [address] [place], [place] [address]    Culture MULTIPLE SPECIES PRESENT, SUGGEST RECOLLECTION (A)  Final   Report Status 04/25/2019 FINAL  Final        Radiology Studies: No results found.    Scheduled Meds: . atorvastatin  20 mg Oral Daily  . Chlorhexidine Gluconate Cloth  6 each Topical Daily  . docusate sodium  200 mg Oral BID  . enoxaparin (LOVENOX) injection  40 mg Subcutaneous Q24H  . metoprolol tartrate  25 mg Oral BID  . morphine  15 mg Oral Q12H  . polyethylene glycol  17 g Oral BID  . senna-docusate  2 tablet Oral BID   Continuous Infusions: . sodium chloride Stopped (04/27/19 2248)  . sodium chloride    . ceFEPime (MAXIPIME) IV 2 g (04/29/19 2230)     LOS: 7 days     Flora Lipps, MD [facility] Pager AMION  If 7PM-7AM, please contact night-coverage [email or web address] Password [email or web address] 04/30/2019, 7:55 AM

## 2019-05-01 ENCOUNTER — Ambulatory Visit: Payer: BC Managed Care – PPO | Admitting: Internal Medicine

## 2019-05-01 ENCOUNTER — Encounter (HOSPITAL_COMMUNITY): Payer: Self-pay | Admitting: Interventional Radiology

## 2019-05-01 ENCOUNTER — Inpatient Hospital Stay (HOSPITAL_COMMUNITY): Payer: BC Managed Care – PPO

## 2019-05-01 HISTORY — PX: IR PERC PLEURAL DRAIN W/INDWELL CATH W/IMG GUIDE: IMG5383

## 2019-05-01 LAB — COMPREHENSIVE METABOLIC PANEL
ALT: 18 U/L (ref 0–44)
AST: 11 U/L — ABNORMAL LOW (ref 15–41)
Albumin: 3.3 g/dL — ABNORMAL LOW (ref 3.5–5.0)
Alkaline Phosphatase: 107 U/L (ref 38–126)
Anion gap: 7 (ref 5–15)
BUN: 5 mg/dL — ABNORMAL LOW (ref 6–20)
CO2: 27 mmol/L (ref 22–32)
Calcium: 10.9 mg/dL — ABNORMAL HIGH (ref 8.9–10.3)
Chloride: 108 mmol/L (ref 98–111)
Creatinine, Ser: 0.62 mg/dL (ref 0.44–1.00)
GFR calc Af Amer: >60 mL/min (ref >60)
GFR calc non Af Amer: >60 mL/min (ref >60)
Glucose, Bld: 97 mg/dL (ref 70–99)
Potassium: 3.4 mmol/L — ABNORMAL LOW (ref 3.5–5.1)
Sodium: 142 mmol/L (ref 135–145)
Total Bilirubin: 0.4 mg/dL (ref 0.3–1.2)
Total Protein: 6.9 g/dL (ref 6.5–8.1)

## 2019-05-01 LAB — CBC WITH DIFFERENTIAL/PLATELET
Abs Immature Granulocytes: 0.02 10*3/uL (ref 0.00–0.07)
Basophils Absolute: 0 10*3/uL (ref 0.0–0.1)
Basophils Relative: 0 %
Eosinophils Absolute: 0.3 10*3/uL (ref 0.0–0.5)
Eosinophils Relative: 3 %
HCT: 33.4 % — ABNORMAL LOW (ref 36.0–46.0)
Hemoglobin: 9.8 g/dL — ABNORMAL LOW (ref 12.0–15.0)
Immature Granulocytes: 0 %
Lymphocytes Relative: 12 %
Lymphs Abs: 1 10*3/uL (ref 0.7–4.0)
MCH: 25.5 pg — ABNORMAL LOW (ref 26.0–34.0)
MCHC: 29.3 g/dL — ABNORMAL LOW (ref 30.0–36.0)
MCV: 86.8 fL (ref 80.0–100.0)
Monocytes Absolute: 0.9 10*3/uL (ref 0.1–1.0)
Monocytes Relative: 11 %
Neutro Abs: 6.1 10*3/uL (ref 1.7–7.7)
Neutrophils Relative %: 74 %
Platelets: 355 10*3/uL (ref 150–400)
RBC: 3.85 MIL/uL — ABNORMAL LOW (ref 3.87–5.11)
RDW: 17.8 % — ABNORMAL HIGH (ref 11.5–15.5)
WBC: 8.3 10*3/uL (ref 4.0–10.5)
nRBC: 0 % (ref 0.0–0.2)

## 2019-05-01 LAB — NOVEL CORONAVIRUS, NAA (HOSP ORDER, SEND-OUT TO REF LAB; TAT 18-24 HRS): SARS-CoV-2, NAA: NOT DETECTED

## 2019-05-01 LAB — PROTIME-INR
INR: 1.1 (ref 0.8–1.2)
Prothrombin Time: 13.8 seconds (ref 11.4–15.2)

## 2019-05-01 MED ORDER — MIDAZOLAM HCL 2 MG/2ML IJ SOLN
INTRAMUSCULAR | Status: AC | PRN
  Administered 2019-05-01 (×3): 1 mg via INTRAVENOUS

## 2019-05-01 MED ORDER — POTASSIUM CHLORIDE CRYS ER 20 MEQ PO TBCR
40.0000 meq | EXTENDED_RELEASE_TABLET | Freq: Once | ORAL | Status: AC
  Administered 2019-05-01: 19:00:00 40 meq via ORAL
  Filled 2019-05-01: qty 2

## 2019-05-01 MED ORDER — MORPHINE SULFATE (PF) 2 MG/ML IV SOLN
2.0000 mg | Freq: Once | INTRAVENOUS | Status: AC
  Administered 2019-05-01: 23:00:00 2 mg via INTRAVENOUS
  Filled 2019-05-01: qty 1

## 2019-05-01 MED ORDER — LIDOCAINE HCL 1 % IJ SOLN
INTRAMUSCULAR | Status: AC
  Filled 2019-05-01: qty 20

## 2019-05-01 MED ORDER — CEFAZOLIN SODIUM-DEXTROSE 2-4 GM/100ML-% IV SOLN
INTRAVENOUS | Status: AC
  Filled 2019-05-01: qty 100

## 2019-05-01 MED ORDER — MORPHINE SULFATE (PF) 4 MG/ML IV SOLN
4.0000 mg | Freq: Once | INTRAVENOUS | Status: AC
  Administered 2019-05-01: 4 mg via INTRAVENOUS
  Filled 2019-05-01: qty 1

## 2019-05-01 MED ORDER — FENTANYL CITRATE (PF) 100 MCG/2ML IJ SOLN
INTRAMUSCULAR | Status: AC
  Filled 2019-05-01: qty 2

## 2019-05-01 MED ORDER — ENOXAPARIN SODIUM 40 MG/0.4ML ~~LOC~~ SOLN
40.0000 mg | Freq: Every day | SUBCUTANEOUS | Status: DC
  Administered 2019-05-02 – 2019-05-11 (×9): 40 mg via SUBCUTANEOUS
  Filled 2019-05-01 (×10): qty 0.4

## 2019-05-01 MED ORDER — MIDAZOLAM HCL 2 MG/2ML IJ SOLN
INTRAMUSCULAR | Status: AC
  Filled 2019-05-01: qty 4

## 2019-05-01 MED ORDER — FENTANYL CITRATE (PF) 100 MCG/2ML IJ SOLN
INTRAMUSCULAR | Status: AC | PRN
  Administered 2019-05-01 (×4): 50 ug via INTRAVENOUS

## 2019-05-01 MED ORDER — LIDOCAINE HCL (PF) 1 % IJ SOLN
INTRAMUSCULAR | Status: AC | PRN
  Administered 2019-05-01: 10 mL

## 2019-05-01 NOTE — Progress Notes (Signed)
PROGRESS NOTE    Brittany Rodgers  VOZ:[id number] DOB: 28-Oct-1970 DOA: 04/23/2019 PCP: [name], MD   Brief Narrative:  Brittany Rodgers is a 48 y.o. female with medical history significant of metastatic breast cancer s/p mastectomy, presented to ED with complaints of increased shortness of breath and lethargy. Patient was seen recently in Oncology clinic on 7/28, where pt was noted to have new chest wall mass as well as liver mass. Pt was being considered for possible biopsy. On day of admit, pt noted to be increasingly sob with tachycardia and was subseqently referred to ED from Oncology clinic.  In the ED, pt was noted to have HR in excess of 140's, ultimately improved to the 110's with one dose of IV metoprolol. Pt underwent CT chest with findings worrisome for large Right sided effusion. Pt remained on minimal O2 support. IR was consulted by EDP and hospitalist consulted for consideration for admission.  Patient had thoracentesis and biopsy of chest wall mass which showed metastatic disease.  Currently planning for pleurx placement on 8/6. chemotherapy to start on 8/11.  Assessment & Plan:   Principal Problem:   Pleural effusion Active Problems:   IBS   Hyperlipidemia   Chronic right hip pain   Malignant neoplasm of lower-outer quadrant of right breast of female, estrogen receptor negative (HCC)   S/P mastectomy   Pleural effusion on right  SIRS with concern for sepsis  Presyncope: on cefepime.  Urinalysis was abnormal but urine culture showed multiple species.  Blood cultures were negative.  Pleural fluid cultures were negative as well. Off vancomycin.  We will continue to monitor.  Afebrile at this time.  WBC count of 7.6.   Malignant Pleural Effusion CT with moderate to large Right pleural effusion with associated atelectasis or consolidation, s/p thoracentesis.  Pleural fluid culture without any organisms.  Cytology consistent with metastatic adenocarcinoma.  Awaiting  for Pleurx catheter placement.  Stage IV Breast cancer, s/p mastectomy Followed by [name], oncology as outpatient. new chest wall mass and liver lesion with mutliple small bilateral pulm nodules, new or enlarged from prior exam concerning for metastatic disease. 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  Palliative care on board.  Hyperlipidemia.  Continue statin  Sinus tachycardia with hypertension today Closely monitor.  Continue metoprolol.  Chronic pain On MSIR and MS contin  Hypercalcemia: Likely secondary to malignancy. spoke with [name] hemato-oncology yesterday and he recommended pamidronate 90 mg x 1 yesterday.  Calcium was down to 10.9 from 11.2.  This will need to be monitored as outpatient.  Debility, weakness.  Seen by physical therapy, occupational therapy.  Recommend home health/outpatient PT on discharge.  DVT prophylaxis: Lovenox  Code Status: Full code  Family Communication:   Disposition Plan: Home with outpatient PT/ OT likely tomorrow morning..  Awaiting for Pleurx catheter placement today.  Patient will need Pleurx catheter teaching possible home PT OT and nurse on discharge.   Consultants:   IR  Oncology  Palliative care  Procedures:  CT guided core bx Right anterior chest wall mass 7/30 US thoracentesis 7/30  Antimicrobials:  Anti-infectives (From admission, onward)   Start     Dose/Rate Route Frequency Ordered Stop   05/01/19 0928  ceFAZolin (ANCEF) 2-4 GM/100ML-% IVPB    Note to  Pharmacy: [name]   : cabinet override      05/01/19 0928 05/01/19 2129   04/25/19 0400  vancomycin (VANCOCIN) IVPB 750 mg/150 ml premix  Status:  Discontinued     750 mg 150 mL/hr over 60 Minutes Intravenous Every 12 hours 04/24/19 1332 04/25/19 1447    04/24/19 2200  ceFEPIme (MAXIPIME) 2 g in sodium chloride 0.9 % 100 mL IVPB     2 g 200 mL/hr over 30 Minutes Intravenous Every 8 hours 04/24/19 1309 04/30/19 2359   04/24/19 1315  ceFEPIme (MAXIPIME) 2 g in sodium chloride 0.9 % 100 mL IVPB     2 g 200 mL/hr over 30 Minutes Intravenous STAT 04/24/19 1307 04/24/19 1536   04/24/19 1315  vancomycin (VANCOCIN) 1,750 mg in sodium chloride 0.9 % 500 mL IVPB     1,750 mg 250 mL/hr over 120 Minutes Intravenous STAT 04/24/19 1307 04/24/19 1915     Subjective: Today, patient denies increasing shortness of breath cough or fever.  Awaiting for Pleurx catheter placement.  Objective: Vitals:   05/01/19 1305 05/01/19 1310 05/01/19 1315 05/01/19 1336  BP: (!) 161/106 (!) 153/102 (!) 140/108 (!) 127/112  Pulse: (!) 113 (!) 102  (!) 110  Resp: (!) 27 (!) 29  18  Temp:    99.1 F (37.3 C)  TempSrc:    Oral  SpO2: 100% 100%  100%  Weight:      Height:        Intake/Output Summary (Last 24 hours) at 05/01/2019 1409 Last data filed at 05/01/2019 0300 Gross per 24 hour  Intake 745.85 ml  Output -  Net 745.85 ml   Filed Weights   04/24/19 0003  Weight: 78.5 kg    Examination: General:  Average built, not in obvious distress.  Morbidly obese. HENT: Normocephalic, pupils equally reacting to light and accommodation.  No scleral pallor or icterus noted. Oral mucosa is moist.  Chest: Right mastectomy with his current dressing.  Right chest wall Port-A-Cath in place.  Diminished breath sounds bilaterally. CVS: S1 &S2 heard. No murmur.  Regular rate and rhythm. Abdomen: Soft, nontender, nondistended.  Bowel sounds are heard.  Liver is not palpable, no abdominal mass palpated Extremities: No cyanosis, clubbing or edema.  Peripheral pulses are palpable. Psych: Alert, awake and oriented, normal mood CNS:  No cranial nerve deficits.  Power equal in all extremities.  No sensory deficits noted.  No cerebellar signs.   Skin: Warm and dry.  Right chest wall  scar.   Data Reviewed: I have personally reviewed following labs and imaging studies  CBC: Recent Labs  Lab 04/26/19 0449 04/27/19 0414 04/29/19 0558 04/30/19 0352 05/01/19 0402  WBC 5.3 6.0 7.7 7.6 8.3  NEUTROABS  --   --   --   --  6.1  HGB 10.2* 9.4* 9.5* 9.7* 9.8*  HCT 35.2* 32.3* 32.1* 33.8* 33.4*  MCV 87.8 88.0 87.7 88.0 86.8  PLT 367 405* 328 347 732   Basic Metabolic Panel: Recent Labs  Lab 04/25/19 0230 04/26/19 0449 04/27/19 0414 04/29/19 0558 04/30/19 0352 05/01/19 0402  NA 140 140 141 143 143 142  K 3.8 4.0 3.9 3.6 3.9 3.4*  CL 104 103 105 107 109 108  CO2 26 28 26 26 26 27   GLUCOSE 121* 104* 105* 104* 105* 97  BUN 8 8 8 7 7  5*  CREATININE 0.65 0.69 0.72 0.67 0.62 0.62  CALCIUM 8.7* 9.7 9.7 11.1* 11.2* 10.9*  MG 1.6* 1.9 1.9 1.8  1.9  --    GFR: Estimated Creatinine Clearance: 82.6 mL/min (by C-G formula based on SCr of 0.62 mg/dL). Liver Function Tests: Recent Labs  Lab 04/26/19 0449 04/27/19 0414 04/29/19 0558 04/30/19 0352 05/01/19 0402  AST 9* 9* 10* 10* 11*  ALT 22 19 19 19 18   ALKPHOS 85 88 98 105 107  BILITOT 0.6 0.3 0.4 0.4 0.4  PROT 6.8 6.6 6.9 7.2 6.9  ALBUMIN 3.1* 2.9* 3.0* 3.2* 3.3*   No results for input(s): LIPASE, AMYLASE in the last 168 hours. No results for input(s): AMMONIA in the last 168 hours. Coagulation Profile: Recent Labs  Lab 05/01/19 0402  INR 1.1   Cardiac Enzymes: No results for input(s): CKTOTAL, CKMB, CKMBINDEX, TROPONINI in the last 168 hours. BNP (last 3 results) No results for input(s): PROBNP in the last 8760 hours. HbA1C: No results for input(s): HGBA1C in the last 72 hours. CBG: No results for input(s): GLUCAP in the last 168 hours. Lipid Profile: No results for input(s): CHOL, HDL, LDLCALC, TRIG, CHOLHDL, LDLDIRECT in the last 72 hours. Thyroid Function Tests: No results for input(s): TSH, T4TOTAL, FREET4, T3FREE, THYROIDAB in the last 72 hours. Anemia Panel: No results for input(s):  VITAMINB12, FOLATE, FERRITIN, TIBC, IRON, RETICCTPCT in the last 72 hours. Sepsis Labs: Recent Labs  Lab 04/24/19 1650 04/24/19 1932 04/25/19 0230 04/26/19 0449  PROCALCITON  --   --  <0.10 <0.10  LATICACIDVEN 2.1* 1.9  --   --     Recent Results (from the past 240 hour(s))  SARS Coronavirus 2 (CEPHEID - Performed in [facility] hospital lab), Hosp Order     Status: None   Collection Time: 04/23/19  1:06 PM   Specimen: Nasopharyngeal Swab  Result Value Ref Range Status   SARS Coronavirus 2 NEGATIVE NEGATIVE Final    Comment: (NOTE) If result is NEGATIVE SARS-CoV-2 target nucleic acids are NOT DETECTED. The SARS-CoV-2 RNA is generally detectable in upper and lower  respiratory specimens during the acute phase of infection. The lowest  concentration of SARS-CoV-2 viral copies this assay can detect is 250  copies / mL. A negative result does not preclude SARS-CoV-2 infection  and should not be used as the sole basis for treatment or other  patient management decisions.  A negative result may occur with  improper specimen collection / handling, submission of specimen other  than nasopharyngeal swab, presence of viral mutation(s) within the  areas targeted by this assay, and inadequate number of viral copies  (<250 copies / mL). A negative result must be combined with clinical  observations, patient history, and epidemiological information. If result is POSITIVE SARS-CoV-2 target nucleic acids are DETECTED. The SARS-CoV-2 RNA is generally detectable in upper and lower  respiratory specimens dur ing the acute phase of infection.  Positive  results are indicative of active infection with SARS-CoV-2.  Clinical  correlation with patient history and other diagnostic information is  necessary to determine patient infection status.  Positive results do  not rule out bacterial infection or co-infection with other viruses. If result is PRESUMPTIVE POSTIVE SARS-CoV-2 nucleic acids MAY BE  PRESENT.   A presumptive positive result was obtained on the submitted specimen  and confirmed on repeat testing.  While 2019 novel coronavirus  (SARS-CoV-2) nucleic acids may be present in the submitted sample  additional confirmatory testing may be necessary for epidemiological  and / or clinical management purposes  to differentiate between  SARS-CoV-2 and other Sarbecovirus currently known to infect humans.  If clinically indicated additional testing with an alternate test  methodology [phone]) is advised. The SARS-CoV-2 RNA is generally  detectable in upper and lower respiratory sp ecimens during the acute  phase of infection. The expected result is Negative. Fact Sheet for Patients:  StrictlyIdeas.no Fact Sheet for Healthcare Providers: BankingDealers.co.za This test is not yet approved or cleared by the [place] FDA and has been authorized for detection and/or diagnosis of SARS-CoV-2 by FDA under an Emergency Use Authorization (EUA).  This EUA will remain in effect (meaning this test can be used) for the duration of the COVID-19 declaration under Section 564(b)(1) of the Act, 21 U.S.C. section 360bbb-3(b)(1), unless the authorization is terminated or revoked sooner. Performed at [facility], [place] [address] [place], [place] [address]   Acid Fast Smear (AFB)     Status: None   Collection Time: 04/24/19 10:37 AM   Specimen: Pleural, Right; Pleural Fluid  Result Value Ref Range Status   AFB Specimen Processing Concentration  Final   Acid Fast Smear Negative  Final    Comment: (NOTE) Performed At: [facility] [place], [place] [id number] Rush Farmer MD KD:[id number]    Source (AFB) PLEURAL  Final    Comment: RT Performed at [facility], [place] [address], [place], [place] [address]   Culture, body fluid-bottle     Status: None   Collection Time: 04/24/19  10:37 AM   Specimen: Pleura  Result Value Ref Range Status   Specimen Description PLEURAL RIGHT  Final   Special Requests NONE  Final   Culture   Final    NO GROWTH 5 DAYS Performed at [facility], [address], [place], [place] [address]    Report Status 04/29/2019 FINAL  Final  Gram stain     Status: None   Collection Time: 04/24/19 10:37 AM   Specimen: Pleura  Result Value Ref Range Status   Specimen Description PLEURAL RIGHT  Final   Special Requests NONE  Final   Gram Stain   Final    FEW WBC PRESENT, PREDOMINANTLY MONONUCLEAR NO ORGANISMS SEEN Performed at [facility], [place] [address] [place], [place] [address]    Report Status 04/24/2019 FINAL  Final  Culture, blood (routine x 2)     Status: None   Collection Time: 04/24/19  1:35 PM   Specimen: BLOOD LEFT HAND  Result Value Ref Range Status   Specimen Description   Final    BLOOD LEFT HAND Performed at [place] [address], [place], [place] [address]    Special Requests   Final    BOTTLES DRAWN AEROBIC ONLY Blood Culture adequate volume   Culture   Final    NO GROWTH 5 DAYS Performed at [facility], [place] [address] [place], [place] [address]    Report Status 04/29/2019 FINAL  Final  Culture, blood (routine x 2)     Status: None   Collection Time: 04/24/19  1:35 PM   Specimen: BLOOD LEFT HAND  Result Value Ref Range Status   Specimen Description   Final    BLOOD LEFT HAND Performed at [place] [address], [place], [place] [address]    Special Requests   Final    BOTTLES DRAWN AEROBIC ONLY Blood Culture results may not be optimal due to an inadequate volume of blood received in culture bottles Performed at [facility] [place]  [address] [place], [place] [address]    Culture   Final    NO GROWTH 5 DAYS Performed at [facility], [place] [address], [place], [place] [address]    Report Status 04/29/2019 FINAL   Final  Culture, Urine     Status: Abnormal   Collection Time: 04/24/19  4:50 PM   Specimen: Urine, Random  Result Value Ref Range Status   Specimen Description   Final    URINE, RANDOM Performed at [place] [address], [place], [place] [address]    Special Requests   Final    NONE Performed at [facility], [place] [address], [place], [place] [address]    Culture MULTIPLE SPECIES PRESENT, SUGGEST RECOLLECTION (A)  Final   Report Status 04/25/2019 FINAL  Final      Radiology Studies: No results found.    Scheduled Meds: . atorvastatin  20 mg Oral Daily  . Chlorhexidine Gluconate Cloth  6 each Topical Daily  . docusate sodium  200 mg Oral BID  . enoxaparin (LOVENOX) injection  40 mg Subcutaneous Q24H  . fentaNYL      . fentaNYL      . lidocaine      . metoprolol tartrate  25 mg Oral BID  . midazolam      . morphine  15 mg Oral Q12H  . polyethylene glycol  17 g Oral BID  . potassium chloride  40 mEq Oral Once  . senna-docusate  2 tablet Oral BID   Continuous Infusions: . sodium chloride Stopped (04/27/19 2248)  . ceFAZolin       LOS: 8 days     Flora Lipps, MD [facility] Pager AMION  If 7PM-7AM, please contact night-coverage [email or web address] Password [email or web address] 05/01/2019, 2:09 PM

## 2019-05-01 NOTE — Progress Notes (Signed)
Physical Therapy Treatment Patient Details Name: Brittany Rodgers MRN: [id number] DOB: 02/28/1971 Today's Date: 05/01/2019    History of Present Illness Brittany Rodgers is a 47 y.o. female with medical history significant of metastatic breast cancer s/p mastectomy. She presented to ED on 04/23/19 with complaints of increased sob and lethargy.    PT Comments    Pt very fatigued today and stating she is awaiting procedure to drain fluid from lungs.  She was agreeable to ambulate to/from restroom, however did not wish to ambulate in hallway today.  Pt requested that she remain on 2L O2 throughout and following gait SaO2 was 100%.   Pt will continue to benefit from skilled acute services to address deficits.    Follow Up Recommendations  Outpatient PT     Equipment Recommendations  None recommended by PT    Recommendations for Other Services       Precautions / Restrictions Precautions Precautions: Fall Precaution Comments: R recent mastectomy Restrictions Weight Bearing Restrictions: No Other Position/Activity Restrictions: WBAT    Mobility  Bed Mobility Overal bed mobility: Needs Assistance Bed Mobility: Supine to Sit     Supine to sit: Supervision     General bed mobility comments: S for supine to sit with use of bedrails.  Note pt sitting very close to EOB, therefore needed mod cues to scoot back from EOB.  Transfers Overall transfer level: Needs assistance Equipment used: Rolling walker (2 wheeled);1 person hand held assist Transfers: Sit to/from Stand Sit to Stand: Min guard         General transfer comment: Pt requires increased time and cues for hand placement as she wanted to pull on RW for support.  Ambulation/Gait Ambulation/Gait assistance: Min guard Gait Distance (Feet): 10 Feet(then another 15') Assistive device: Rolling walker (2 wheeled);1 person hand held assist Gait Pattern/deviations: Step-through pattern;Decreased stride length Gait velocity:  decreased   General Gait Details: Provided RW initially to get into restroom as she reports feeling weak and fatigued.  Cues for posture and safety, however once in restroom, pt sat RW to the side while she laid toilet paper on toilet prior to sitting. Cues for safe turning technique due to IV/O2, and also cues to guide her prior to sitting for safety.  Following using restroom, pt reports "sometimes I need the walker and sometimes I don't, I just need to see how I feel when I get up."  She ambulated from restroom without walker with light HHA and improved posture.   Stairs             Wheelchair Mobility    Modified Rankin (Stroke Patients Only)       Balance Overall balance assessment: Needs assistance Sitting-balance support: No upper extremity supported;Feet supported Sitting balance-Leahy Scale: Good     Standing balance support: No upper extremity supported;During functional activity;Single extremity supported Standing balance-Leahy Scale: Fair Standing balance comment: Pt able to bend forward to place toilet paper on toilet prior to sitting without support and only CGA from PT, she also stood at sink initially without support, but then stooped forward with forearms on sink while washing hands.                            Cognition Arousal/Alertness: Lethargic Behavior During Therapy: WFL for tasks assessed/performed Overall Cognitive Status: Within Functional Limits for tasks assessed  General Comments: slow to respond at times      Exercises      General Comments        Pertinent Vitals/Pain Pain Assessment: 0-10 Pain Score: 3  Pain Location: Rt chest Pain Descriptors / Indicators: Guarding;Discomfort;Sore Pain Intervention(s): Monitored during session    Home Living                      Prior Function            PT Goals (current goals can now be found in the care plan section) Acute  Rehab PT Goals Patient Stated Goal: no specific goals stated by pt, she wants to continue OPPT PT Goal Formulation: With patient Time For Goal Achievement: 05/03/19 Potential to Achieve Goals: Good Progress towards PT goals: Progressing toward goals    Frequency    Min 3X/week      PT Plan Current plan remains appropriate    Co-evaluation              AM-PAC PT "6 Clicks" Mobility   Outcome Measure  Help needed turning from your back to your side while in a flat bed without using bedrails?: A Little Help needed moving from lying on your back to sitting on the side of a flat bed without using bedrails?: A Little Help needed moving to and from a bed to a chair (including a wheelchair)?: A Little Help needed standing up from a chair using your arms (e.g., wheelchair or bedside chair)?: A Little Help needed to walk in hospital room?: A Little Help needed climbing 3-5 steps with a railing? : A Little 6 Click Score: 18    End of Session Equipment Utilized During Treatment: Gait belt Activity Tolerance: Patient limited by fatigue Patient left: in chair;with call bell/phone within reach Nurse Communication: Mobility status PT Visit Diagnosis: Muscle weakness (generalized) (M62.81);Difficulty in walking, not elsewhere classified (R26.2)     Time: 2836-6294 PT Time Calculation (min) (ACUTE ONLY): 25 min  Charges:  $Gait Training: 8-22 mins $Therapeutic Activity: 8-22 mins                    Cameron Sprang, PT, MPT  05/01/19, 11:47 AM    [name], Betha Loa 05/01/2019, 11:46 AM

## 2019-05-01 NOTE — Procedures (Signed)
Pre Procedural Dx: Metastatic breast cancer; Malignant right sided pleural effusion Post Procedural Dx: Same  Successful placement of a right sided pleural drainage catheter. Approximately 1.3 L of thin bloody pleural fluid aspirated after catheter placement.  EBL: Minimal Complications: None immediate.  Ronny Bacon, MD Pager #: [phone]

## 2019-05-01 NOTE — Progress Notes (Signed)
MEDICATION-RELATED CONSULT NOTE   IR Procedure Consult - Anticoagulant/Antiplatelet PTA/Inpatient Med List Review by Pharmacist    Procedure: right sided pleural drainage catheter    Completed: 05/01/19 at ~1545  Post-Procedural bleeding risk per IR MD assessment:  standard  Antithrombotic medications on inpatient or PTA profile prior to procedure:   Lovenox 40mg  daily    Recommended restart time per IR Post-Procedure Guidelines:  Day + 1 (AM)   Other considerations:      Plan:    Restart Lovenox 40mg  SQ daily beginning tomorrow AM  Adrian Saran, PharmD, BCPS 05/01/2019 4:03 PM

## 2019-05-01 NOTE — Progress Notes (Signed)
OT Cancellation Note  Patient Details Name: Lendy Dittrich MRN: [id number] DOB: 1971-09-07   Cancelled Treatment:    Reason Eval/Treat Not Completed: Other (comment). Pt s/p procedure. Will likely check back tomorrow.  Dekayla Prestridge 05/01/2019, 2:19 PM  Lesle Chris, OTR/L Acute Rehabilitation Services [phone] [facility] pager [phone] office 05/01/2019

## 2019-05-02 ENCOUNTER — Ambulatory Visit: Payer: BC Managed Care – PPO

## 2019-05-02 ENCOUNTER — Ambulatory Visit: Payer: BC Managed Care – PPO | Admitting: Adult Health

## 2019-05-02 ENCOUNTER — Other Ambulatory Visit: Payer: BC Managed Care – PPO

## 2019-05-02 ENCOUNTER — Inpatient Hospital Stay (HOSPITAL_COMMUNITY): Payer: BC Managed Care – PPO

## 2019-05-02 LAB — CBC
HCT: 34.6 % — ABNORMAL LOW (ref 36.0–46.0)
Hemoglobin: 10 g/dL — ABNORMAL LOW (ref 12.0–15.0)
MCH: 25.1 pg — ABNORMAL LOW (ref 26.0–34.0)
MCHC: 28.9 g/dL — ABNORMAL LOW (ref 30.0–36.0)
MCV: 86.7 fL (ref 80.0–100.0)
Platelets: 371 10*3/uL (ref 150–400)
RBC: 3.99 MIL/uL (ref 3.87–5.11)
RDW: 18.2 % — ABNORMAL HIGH (ref 11.5–15.5)
WBC: 7.3 10*3/uL (ref 4.0–10.5)
nRBC: 0 % (ref 0.0–0.2)

## 2019-05-02 LAB — BASIC METABOLIC PANEL
Anion gap: 11 (ref 5–15)
BUN: 8 mg/dL (ref 6–20)
CO2: 23 mmol/L (ref 22–32)
Calcium: 9.5 mg/dL (ref 8.9–10.3)
Chloride: 106 mmol/L (ref 98–111)
Creatinine, Ser: 0.73 mg/dL (ref 0.44–1.00)
GFR calc Af Amer: >60 mL/min (ref >60)
GFR calc non Af Amer: >60 mL/min (ref >60)
Glucose, Bld: 113 mg/dL — ABNORMAL HIGH (ref 70–99)
Potassium: 3.6 mmol/L (ref 3.5–5.1)
Sodium: 140 mmol/L (ref 135–145)

## 2019-05-02 LAB — MAGNESIUM: Magnesium: 1.6 mg/dL — ABNORMAL LOW (ref 1.7–2.4)

## 2019-05-02 LAB — LACTIC ACID, PLASMA: Lactic Acid, Venous: 2.3 mmol/L (ref 0.5–1.9)

## 2019-05-02 LAB — PROCALCITONIN: Procalcitonin: 0.28 ng/mL

## 2019-05-02 MED ORDER — SODIUM CHLORIDE 0.9 % IV BOLUS
500.0000 mL | Freq: Once | INTRAVENOUS | Status: AC
  Administered 2019-05-02: 500 mL via INTRAVENOUS

## 2019-05-02 NOTE — Progress Notes (Signed)
   05/02/19 1500  OT Visit Information  Last OT Received On 05/02/19  Assistance Needed +1  History of Present Illness Brittany Rodgers is a 48 y.o. female with medical history significant of metastatic breast cancer s/p mastectomy. She presented to ED on 04/23/19 with complaints of increased sob and lethargy.  Precautions  Precautions Fall  Precaution Comments R recent mastectomy  Pain Assessment  Faces Pain Scale 4  Pain Location Rt chest  Pain Descriptors / Indicators Sore  Pain Intervention(s) Limited activity within patient's tolerance;Monitored during session  Cognition  Arousal/Alertness Awake/alert  Behavior During Therapy WFL for tasks assessed/performed  Overall Cognitive Status Within Functional Limits for tasks assessed  Restrictions  Other Position/Activity Restrictions WBAT  Exercises  Exercises Other exercises  Other Exercises  Other Exercises pt seen at bed level. She has been getting OOB and performing ADLs.  Brought handout from OP that she got for UE exercises that she has been working on and she was Patent attorney.  Also educated on shoulder rolls and brought dowel for her to work with as tolerated. Educated to work within pain tolerance.    OT - End of Session  Activity Tolerance Patient tolerated treatment well  Patient left in bed;with call bell/phone within reach  OT Assessment/Plan  OT Visit Diagnosis Muscle weakness (generalized) (M62.81)  Pain - Right/Left Right  Pain - part of body Arm  OT Frequency (ACUTE ONLY) Min 2X/week  Follow Up Recommendations Outpatient OT  OT Equipment None recommended by OT  AM-PAC OT "6 Clicks" Daily Activity Outcome Measure (Version 2)  Help from another person eating meals? 4  Help from another person taking care of personal grooming? 3  Help from another person toileting, which includes using toliet, bedpan, or urinal? 3  Help from another person bathing (including washing, rinsing, drying)? 3  Help from another person to  put on and taking off regular upper body clothing? 3  Help from another person to put on and taking off regular lower body clothing? 3  6 Click Score 19  OT Goal Progression  Progress towards OT goals Progressing toward goals  OT Time Calculation  OT Start Time (ACUTE ONLY) 1257  OT Stop Time (ACUTE ONLY) 1309  OT Time Calculation (min) 12 min  OT General Charges  $OT Visit 1 Visit  OT Treatments  $Therapeutic Exercise 8-22 mins  Lesle Chris, OTR/L Acute Rehabilitation Services [phone] [facility] pager [phone] office 05/02/2019

## 2019-05-02 NOTE — Progress Notes (Addendum)
PROGRESS NOTE    Brittany Rodgers  OHY:[id number] DOB: 05-17-1971 DOA: 04/23/2019 PCP: [name], MD   Brief Narrative:  Brittany Rodgers is a 48 y.o. female with medical history significant of metastatic breast cancer s/p mastectomy, presented to ED with complaints of increased shortness of breath and lethargy. Patient was seen recently in Oncology clinic on 7/28, where pt was noted to have new chest wall mass as well as liver mass. Pt was being considered for possible biopsy. On day of admit, pt noted to be increasingly sob with tachycardia and was subseqently referred to ED from Oncology clinic.  In the ED, pt was noted to have HR in excess of 140's, ultimately improved to the 110's with one dose of IV metoprolol. Pt underwent CT chest with findings worrisome for large Right sided effusion. Pt remained on minimal O2 support. IR was consulted by EDP and hospitalist consulted for consideration for admission.  Patient had thoracentesis and biopsy of chest wall mass which showed metastatic disease.  Underwent pleurx placement on 8/6. Tentative chemotherapy to start on 8/11.  Assessment & Plan:   Principal Problem:   Pleural effusion Active Problems:   IBS   Hyperlipidemia   Chronic right hip pain   Malignant neoplasm of lower-outer quadrant of right breast of female, estrogen receptor negative (HCC)   S/P mastectomy   Pleural effusion on right  SIRS with concern for sepsis   Presyncope: Patient was on cefepime until 05/01/2019.  Urinalysis was abnormal but urine culture showed multiple species.  Blood cultures were negative.  Repeat blood cultures have been sent this morning,T-max of 102.7 F.  Previous pleural fluid cultures were negative.    Fever and confusion likely metabolic encephalopathy.  Fever after right Pleurx catheter placement.  Similar incidence of fever after thoracocentesis in the past.  Repeat blood cultures have been sent.  Will closely monitor for fever trend.   Patient was previously on vancomycin and was on cefepime until yesterday which was discontinued.  Will monitor off antibiotic for now due to recent prolonged patient administration of antibiotic.  Check lactate and procalcitonin today.  We will also obtain CT scan of the head for baseline evaluation.   Malignant right pleural Effusion CT with moderate to large Right pleural effusion with associated atelectasis or consolidation, s/p thoracentesis. Cytology consistent with metastatic adenocarcinoma.  Status post right Pleurx catheter placement on 05/01/19.  Patient will need Pleurx catheter teaching and home health nurse on discharge.  Not in obvious distress today.  Stage IV Breast cancer with malignant right pleural effusion, s/p mastectomy Followed by [name], oncology as outpatient.  New chest wall mass and liver lesion with mutliple small bilateral pulm nodules, new or enlarged from prior exam concerning for metastatic disease. Planning for chemo on August 11 if patient remains clinically stable.  Palliative care saw the patient during hospitalization.   Hyperlipidemia.  Continue statin  Sinus tachycardia with hypertension today Still tachycardic likely secondary to fever.  Blood pressure has improved.  Continue metoprolol.   Chronic pain On MSIR and MS contin.  Requiring IV morphine for additional pain relief.  Hypercalcemia: Likely secondary to malignancy.  As per [name] hemato-oncology patient received pamidronate 90 mg x 1 with improvement in calcium levels.  Calcium level of 9.5 today.  Debility, weakness.  Seen by physical therapy, occupational therapy.  Recommend home health/outpatient PT on discharge.  DVT prophylaxis: Lovenox  Code Status: Full code  Family Communication: I tried to call  the patient's spouse on the home and mobile phone listed yesterday and today without any success.  Disposition Plan: Home with outpatient PT/ OT in the next 24 to 48 hours but patient  is spiking new fever with confusion.  Follow off antibiotics with new blood cultures.  Check CT scan of the head.  Consultants:   IR  Oncology  Palliative care  Procedures:  CT guided core bx Right anterior chest wall mass 7/30 US thoracentesis 7/30 Pleurx catheter placement on 05/01/2019 by interventional radiology  Antimicrobials:  Anti-infectives (From admission, onward)   Start     Dose/Rate Route Frequency Ordered Stop   05/01/19 0928  ceFAZolin (ANCEF) 2-4 GM/100ML-% IVPB    Note to Pharmacy: [name]   : cabinet override      05/01/19 0928 05/01/19 2129   04/25/19 0400  vancomycin (VANCOCIN) IVPB 750 mg/150 ml premix  Status:  Discontinued     750 mg 150 mL/hr over 60 Minutes Intravenous Every 12 hours 04/24/19 1332 04/25/19 1447   04/24/19 2200  ceFEPIme (MAXIPIME) 2 g in sodium chloride 0.9 % 100 mL IVPB     2 g 200 mL/hr over 30 Minutes Intravenous Every 8 hours 04/24/19 1309 04/30/19 2359   04/24/19 1315  ceFEPIme (MAXIPIME) 2 g in sodium chloride 0.9 % 100 mL IVPB     2 g 200 mL/hr over 30 Minutes Intravenous STAT 04/24/19 1307 04/24/19 1536   04/24/19 1315  vancomycin (VANCOCIN) 1,750 mg in sodium chloride 0.9 % 500 mL IVPB     1,750 mg 250 mL/hr over 120 Minutes Intravenous STAT 04/24/19 1307 04/24/19 1915     Subjective: Today, patient is easily somnolent but answering on call.  She did have high-grade fever this morning with T-max of 102.7 F but denies any shortness of breath or chest pain today.  She complains of chills.  Objective: Vitals:   05/01/19 1638 05/01/19 1906 05/02/19 0644 05/02/19 1016  BP: (!) 152/101 (!) 160/107 (!) 157/97 (!) 134/94  Pulse: (!) 112 (!) 115 (!) 122 (!) 121  Resp: 18 18 18    Temp: 100 F (37.8 C) 97.8 F (36.6 C) (!) 102.7 F (39.3 C) 98.6 F (37 C)  TempSrc: Axillary Oral Oral Oral  SpO2: 100% 100% 94% 97%  Weight:      Height:        Intake/Output Summary (Last 24 hours) at 05/02/2019 1310 Last data filed at  05/01/2019 1555 Gross per 24 hour  Intake --  Output 400 ml  Net -400 ml   Filed Weights   04/24/19 0003  Weight: 78.5 kg    Examination: General:  Average built, somnolent at the time of my evaluation, obese. HENT: Normocephalic, pupils equally reacting to light and accommodation.  No scleral pallor or icterus noted.  Chest: Right mastectomy with dressing.  Right chest wall Port-A-Cath in place.  Diminished breath sounds bilaterally.  Right Pleurx catheter in place CVS: S1 &S2 heard. No murmur.  Regular rate and rhythm. Abdomen: Soft, nontender, nondistended.  Bowel sounds are heard.   Extremities: No cyanosis, clubbing or edema.  Peripheral pulses are palpable. Psych: Alert, awake but somnolent,  CNS:  No cranial nerve deficits.  Power equal in all extremities.  No sensory deficits noted.  No cerebellar signs.   Skin: Warm and dry.  Right chest wall scar, dressing in place.   Data Reviewed: I have personally reviewed following labs and imaging studies  CBC: Recent Labs  Lab 04/27/19 0414 04/29/19  1505 04/30/19 0352 05/01/19 0402 05/02/19 0345  WBC 6.0 7.7 7.6 8.3 7.3  NEUTROABS  --   --   --  6.1  --   HGB 9.4* 9.5* 9.7* 9.8* 10.0*  HCT 32.3* 32.1* 33.8* 33.4* 34.6*  MCV 88.0 87.7 88.0 86.8 86.7  PLT 405* 328 347 355 697   Basic Metabolic Panel: Recent Labs  Lab 04/26/19 0449 04/27/19 0414 04/29/19 0558 04/30/19 0352 05/01/19 0402 05/02/19 0345  NA 140 141 143 143 142 140  K 4.0 3.9 3.6 3.9 3.4* 3.6  CL 103 105 107 109 108 106  CO2 28 26 26 26 27 23   GLUCOSE 104* 105* 104* 105* 97 113*  BUN 8 8 7 7  5* 8  CREATININE 0.69 0.72 0.67 0.62 0.62 0.73  CALCIUM 9.7 9.7 11.1* 11.2* 10.9* 9.5  MG 1.9 1.9 1.8 1.9  --  1.6*   GFR: Estimated Creatinine Clearance: 82.6 mL/min (by C-G formula based on SCr of 0.73 mg/dL). Liver Function Tests: Recent Labs  Lab 04/26/19 0449 04/27/19 0414 04/29/19 0558 04/30/19 0352 05/01/19 0402  AST 9* 9* 10* 10* 11*  ALT 22 19  19 19 18   ALKPHOS 85 88 98 105 107  BILITOT 0.6 0.3 0.4 0.4 0.4  PROT 6.8 6.6 6.9 7.2 6.9  ALBUMIN 3.1* 2.9* 3.0* 3.2* 3.3*   No results for input(s): LIPASE, AMYLASE in the last 168 hours. No results for input(s): AMMONIA in the last 168 hours. Coagulation Profile: Recent Labs  Lab 05/01/19 0402  INR 1.1   Cardiac Enzymes: No results for input(s): CKTOTAL, CKMB, CKMBINDEX, TROPONINI in the last 168 hours. BNP (last 3 results) No results for input(s): PROBNP in the last 8760 hours. HbA1C: No results for input(s): HGBA1C in the last 72 hours. CBG: No results for input(s): GLUCAP in the last 168 hours. Lipid Profile: No results for input(s): CHOL, HDL, LDLCALC, TRIG, CHOLHDL, LDLDIRECT in the last 72 hours. Thyroid Function Tests: No results for input(s): TSH, T4TOTAL, FREET4, T3FREE, THYROIDAB in the last 72 hours. Anemia Panel: No results for input(s): VITAMINB12, FOLATE, FERRITIN, TIBC, IRON, RETICCTPCT in the last 72 hours. Sepsis Labs: Recent Labs  Lab 04/26/19 0449  PROCALCITON <0.10    Recent Results (from the past 240 hour(s))  SARS Coronavirus 2 (CEPHEID - Performed in [facility] hospital lab), Hosp Order     Status: None   Collection Time: 04/23/19  1:06 PM   Specimen: Nasopharyngeal Swab  Result Value Ref Range Status   SARS Coronavirus 2 NEGATIVE NEGATIVE Final    Comment: (NOTE) If result is NEGATIVE SARS-CoV-2 target nucleic acids are NOT DETECTED. The SARS-CoV-2 RNA is generally detectable in upper and lower  respiratory specimens during the acute phase of infection. The lowest  concentration of SARS-CoV-2 viral copies this assay can detect is 250  copies / mL. A negative result does not preclude SARS-CoV-2 infection  and should not be used as the sole basis for treatment or other  patient management decisions.  A negative result may occur with  improper specimen collection / handling, submission of specimen other  than nasopharyngeal swab, presence of  viral mutation(s) within the  areas targeted by this assay, and inadequate number of viral copies  (<250 copies / mL). A negative result must be combined with clinical  observations, patient history, and epidemiological information. If result is POSITIVE SARS-CoV-2 target nucleic acids are DETECTED. The SARS-CoV-2 RNA is generally detectable in upper and lower  respiratory specimens dur ing the  acute phase of infection.  Positive  results are indicative of active infection with SARS-CoV-2.  Clinical  correlation with patient history and other diagnostic information is  necessary to determine patient infection status.  Positive results do  not rule out bacterial infection or co-infection with other viruses. If result is PRESUMPTIVE POSTIVE SARS-CoV-2 nucleic acids MAY BE PRESENT.   A presumptive positive result was obtained on the submitted specimen  and confirmed on repeat testing.  While 2019 novel coronavirus  (SARS-CoV-2) nucleic acids may be present in the submitted sample  additional confirmatory testing may be necessary for epidemiological  and / or clinical management purposes  to differentiate between  SARS-CoV-2 and other Sarbecovirus currently known to infect humans.  If clinically indicated additional testing with an alternate test  methodology [phone]) is advised. The SARS-CoV-2 RNA is generally  detectable in upper and lower respiratory sp ecimens during the acute  phase of infection. The expected result is Negative. Fact Sheet for Patients:  StrictlyIdeas.no Fact Sheet for Healthcare Providers: BankingDealers.co.za This test is not yet approved or cleared by the [place] FDA and has been authorized for detection and/or diagnosis of SARS-CoV-2 by FDA under an Emergency Use Authorization (EUA).  This EUA will remain in effect (meaning this test can be used) for the duration of the COVID-19 declaration under Section  564(b)(1) of the Act, 21 U.S.C. section 360bbb-3(b)(1), unless the authorization is terminated or revoked sooner. Performed at [facility], [place] [address] [place], [place] [address]   Acid Fast Smear (AFB)     Status: None   Collection Time: 04/24/19 10:37 AM   Specimen: Pleural, Right; Pleural Fluid  Result Value Ref Range Status   AFB Specimen Processing Concentration  Final   Acid Fast Smear Negative  Final    Comment: (NOTE) Performed At: [facility] [place], [place] [id number] Rush Farmer MD [id number]    Source (AFB) PLEURAL  Final    Comment: RT Performed at [facility], [place] [address] [place], [place] [address]   Culture, body fluid-bottle     Status: None   Collection Time: 04/24/19 10:37 AM   Specimen: Pleura  Result Value Ref Range Status   Specimen Description PLEURAL RIGHT  Final   Special Requests NONE  Final   Culture   Final    NO GROWTH 5 DAYS Performed at [facility], [address], [place], [place] [address]    Report Status 04/29/2019 FINAL  Final  Gram stain     Status: None   Collection Time: 04/24/19 10:37 AM   Specimen: Pleura  Result Value Ref Range Status   Specimen Description PLEURAL RIGHT  Final   Special Requests NONE  Final   Gram Stain   Final    FEW WBC PRESENT, PREDOMINANTLY MONONUCLEAR NO ORGANISMS SEEN Performed at [facility], [place] [address], [place], [place] [address]    Report Status 04/24/2019 FINAL  Final  Culture, blood (routine x 2)     Status: None   Collection Time: 04/24/19  1:35 PM   Specimen: BLOOD LEFT HAND  Result Value Ref Range Status   Specimen Description   Final    BLOOD LEFT HAND Performed at [place] [address] [place],  [address]    Special Requests   Final    BOTTLES DRAWN AEROBIC ONLY Blood Culture adequate volume   Culture   Final    NO  GROWTH 5 DAYS Performed at [facility], [place] [address], [place], [place] [address]    Report Status 04/29/2019 FINAL  Final  Culture, blood (routine x 2)     Status: None   Collection Time: 04/24/19  1:35 PM   Specimen: BLOOD LEFT HAND  Result Value Ref Range Status   Specimen Description   Final    BLOOD LEFT HAND Performed at [place] [address] [place], [place] [address]    Special Requests   Final    BOTTLES DRAWN AEROBIC ONLY Blood Culture results may not be optimal due to an inadequate volume of blood received in culture bottles Performed at [place] [address] [place], [place] [address]    Culture   Final    NO GROWTH 5 DAYS Performed at [facility], [place] [address] [place], [place] [address]    Report Status 04/29/2019 FINAL  Final  Culture, Urine     Status: Abnormal   Collection Time: 04/24/19  4:50 PM   Specimen: Urine, Random  Result Value Ref Range Status   Specimen Description   Final    URINE, RANDOM Performed at [place] [address], [place], [place] [address]    Special Requests   Final    NONE Performed at [facility], [place] [address], [place], [place] [address]    Culture MULTIPLE SPECIES PRESENT, SUGGEST RECOLLECTION (A)  Final   Report Status 04/25/2019 FINAL  Final  Novel Coronavirus, NAA (hospital order; send-out to ref lab)     Status: None   Collection Time: 04/29/19 12:02 PM   Specimen: Nasopharyngeal Swab; Respiratory  Result Value Ref Range Status   SARS-CoV-2, NAA NOT DETECTED NOT DETECTED Final    Comment: (NOTE) This test was developed and its performance characteristics determined by [employer]. This test has not been FDA cleared or approved. This test has been authorized by FDA under an Emergency Use Authorization (EUA). This test is only authorized for the duration of time the declaration that circumstances exist justifying the authorization of the  emergency use of in vitro diagnostic tests for detection of SARS-CoV-2 virus and/or diagnosis of COVID-19 infection under section 564(b)(1) of the Act, 21 U.S.C. 426STM-1(D)(6), unless the authorization is terminated or revoked sooner. When diagnostic testing is negative, the possibility of a false negative result should be considered in the context of a patient's recent exposures and the presence of clinical signs and symptoms consistent with COVID-19. An individual without symptoms of COVID-19 and who is not shedding SARS-CoV-2 virus would expect to have a negative (not detected) result in this assay. Performed  At: [facility] [place], [place] [id number] Rush Farmer MD JJ:[id number]    Barry  Final    Comment: Performed at [place] [address] [place], [place] [address]      Radiology Studies: Dg Chest 2 View  Result Date: 05/02/2019 CLINICAL DATA:  Fever, RIGHT-side pleural drainage catheter placed yesterday EXAM: CHEST - 2 VIEW COMPARISON:  04/25/2019 Correlation: CTA chest 04/23/2019 FINDINGS: RIGHT jugular Port-A-Cath with tip projecting over SVC. New RIGHT PleurX catheter. Enlargement of cardiac silhouette. Known RIGHT infrahilar mass incompletely visualized due to partial obscuration by moderate to large RIGHT pleural effusion and basilar atelectasis. LEFT lung clear. No pneumothorax. Bone destruction of the anterior RIGHT fourth and fifth ribs. IMPRESSION: Enlargement of cardiac silhouette. Known RIGHT perihilar mass  with bone destruction of the anterior RIGHT fourth and fifth ribs. Large RIGHT pleural effusion and significant basilar atelectasis. Electronically Signed   By: Lavonia Dana M.D.   On: 05/02/2019 08:52   Ir Perc Pleural Drain W/indwell Cath W/img Guide  Result Date: 05/01/2019 CLINICAL DATA:  History of metastatic breast cancer, now with recurrent symptomatic malignant right-sided pleural  effusion. Request now made for placement of a right-sided tunneled pleural catheter for palliative purposes. EXAM: INSERTION OF TUNNELED RIGHT SIDED PLEURAL DRAINAGE CATHETER COMPARISON:  Ultrasound-guided thoracentesis-04/24/2019; 04/12/2019; chest CTA-04/23/2019 MEDICATIONS: Ancef 2 gm IV; Antibiotic was administered in an appropriate time interval for the procedure. ANESTHESIA/SEDATION: Moderate (conscious) sedation was employed during this procedure. A total of Versed 4 mg and Fentanyl 200 mcg was administered intravenously. Moderate Sedation Time: 16 minutes. The patient's level of consciousness and vital signs were monitored continuously by radiology nursing throughout the procedure under my direct supervision. FLUOROSCOPY TIME:  FLUOROSCOPY TIME 12 seconds (3 mGy) COMPLICATIONS: None immediate. PROCEDURE: The procedure, risks, benefits, and alternatives were explained to the patient, who wish to proceed with the placement of this permanent pleural catheter as the patient is seeking palliative care. The patient understand and consent to the procedure. The right inferior lateral chest and upper abdomen were prepped with Chlorhexidine in a sterile fashion, and a sterile drape was applied covering the operative field. Attention was made to avoid the patient's known right anterior chest wall mass. A sterile gown and sterile gloves were used for the procedure. Initial ultrasound scanning and fluoroscopic imaging demonstrates a recurrent moderate to large pleural effusion. Under direct ultrasound guidance, the right inferior lateral pleural space was accessed with a Yueh sheath needle after the overlying soft tissues were anesthetized with 1% lidocaine with epinephrine. An Amplatz super stiff wire was then advanced under fluoroscopy into the pleural space. A 15.5 French tunneled Pleur-X catheter was tunneled from an incision within the right upper abdominal quadrant to the access site. The pleural access site was  serially dilated under fluoroscopy, ultimately allowing placement of a peel-away sheath. The catheter was advanced through the peel-away sheath. The sheath was then removed. Final catheter positioning was confirmed with a fluoroscopic radiographic image. The access incision was closed with subcutaneous subcuticular 4-0 Vicryl, Dermabond and Steri-Strips. A Prolene retention suture was applied at the catheter exit site. Large volume thoracentesis was performed through the new catheter utilizing provided bulb vacuum assisted drainage bag. The patient tolerated the above procedure well without immediate postprocedural complication. FINDINGS: Preprocedural ultrasound scanning demonstrates a recurrent large sized anechoic right sided pleural effusion. After ultrasound and fluoroscopic guided placement, the catheter is directed right lung apex Following catheter placement, approximately 1.3 L of bloody pleural fluid was removed. IMPRESSION: Successful placement of permanent, tunneled right pleural drainage catheter via lateral approach. 1.3 liters of bloody pleural fluid was removed after catheter placement. Electronically Signed   By: Sandi Mariscal M.D.   On: 05/01/2019 15:42      Scheduled Meds:  atorvastatin  20 mg Oral Daily   Chlorhexidine Gluconate Cloth  6 each Topical Daily   docusate sodium  200 mg Oral BID   enoxaparin (LOVENOX) injection  40 mg Subcutaneous Daily   metoprolol tartrate  25 mg Oral BID   morphine  15 mg Oral Q12H   polyethylene glycol  17 g Oral BID   senna-docusate  2 tablet Oral BID   Continuous Infusions:  sodium chloride 1,000 mL (05/01/19 2138)     LOS: 9 days  Flora Lipps, MD [facility] Pager AMION  If 7PM-7AM, please contact night-coverage [email or web address] Password [email or web address] 05/02/2019, 1:10 PM

## 2019-05-02 NOTE — Progress Notes (Signed)
T102.7 P 122 BP 157/97 Tylenol, Labetalol 5 mg IV given. Dr. Maudie Mercury paged with new orders for blood cultures, UA, DG chest 2 view, Dr. Louanne Belton paged with information for day shift @ 0709 oncoming nurse aware MEWS Guidelines - (patients age 50 and over)  Red - At [place] for Deterioration Yellow - At risk for Deterioration  1. Go to room and assess patient 2. Validate data. Is this patient's baseline? If data confirmed: 3. Is this an acute change? 4. Administer prn meds/treatments as ordered. 5. Note Sepsis score 6. Review goals of care 7. Sports coach, RRT nurse and Provider. 8. Ask Provider to come to bedside.  9. Document patient condition/interventions/response. 10. Increase frequency of vital signs and focused assessments to at least q15 minutes x 4, then q30 minutes x2. - If stable, then q1h x3, then q4h x3 and then q8h or dept. routine. - If unstable, contact Provider & RRT nurse. Prepare for possible transfer. 11. Add entry in progress notes using the smart phrase ".MEWS". 1. Go to room and assess patient 2. Validate data. Is this patient's baseline? If data confirmed: 3. Is this an acute change? 4. Administer prn meds/treatments as ordered? 5. Note Sepsis score 6. Review goals of care 7. Sports coach and Provider 8. Call RRT nurse as needed. 9. Document patient condition/interventions/response. 10. Increase frequency of vital signs and focused assessments to at least q2h x2. - If stable, then q4h x2 and then q8h or dept. routine. - If unstable, contact Provider & RRT nurse. Prepare for possible transfer. 11. Add entry in progress notes using the smart phrase ".MEWS".  Green - Likely stable Lav[place] - Comfort Care Only  1. Continue routine/ordered monitoring.  2. Review goals of care. 1. Continue routine/ordered monitoring. 2. Review goals of care.

## 2019-05-02 NOTE — Progress Notes (Signed)
Critical lab - Lactic Acid 2.3.  Notified Physician.

## 2019-05-02 NOTE — Progress Notes (Signed)
Referring Physician(s): Dr. Florene Glen  Supervising Physician: Corrie Mckusick  Patient Status:  [facility] - In-pt  Chief Complaint: Follow up right PleurX placed 05/01/19 by Dr. Pascal Lux  Subjective:  Patient sitting up in bed with untouched breakfast tray, she is somnolent and is slow to respond to some of my questions, at one point she falls asleep sitting up while we are talking. She arouses easily to voice cues but has trouble keeping her eyes open. She states that she is having pain in her chest and abdomen but cannot pinpoint a specific area, she repeatedly states "just everywhere." She tells me that she does not remember the drain being put in. She is concerned that she does not know how to take care of the PleurX yet.   Allergies: Barbiturates, Methocarbamol, and Sulfonamide derivatives  Medications: Prior to Admission medications   Medication Sig Start Date End Date Taking? Authorizing Provider  atorvastatin (LIPITOR) 20 MG tablet Take 1 tablet (20 mg total) by mouth daily. 05/31/18  Yes [name], MD  cyclobenzaprine (FLEXERIL) 10 MG tablet Take 1 tablet (10 mg total) by mouth 3 (three) times daily. 03/06/19  Yes Rolm Bookbinder, MD  gabapentin (NEURONTIN) 300 MG capsule Take 1 capsule (300 mg total) by mouth 3 (three) times daily. 04/18/19  Yes Florencia Reasons, MD  nystatin (MYCOSTATIN) 100000 UNIT/ML suspension Take 5 mLs (500,000 Units total) by mouth 4 (four) times daily. 04/18/19  Yes Florencia Reasons, MD  oxyCODONE (OXY IR/ROXICODONE) 5 MG immediate release tablet Take 1 tablet (5 mg total) by mouth every 4 (four) hours as needed for moderate pain. 04/02/19  Yes Magrinat, [name], MD  potassium chloride (K-DUR) 10 MEQ tablet TAKE 1 TABLET BY MOUTH ONCE DAILY Patient taking differently: Take 10 mEq by mouth daily.  11/30/17  Yes [name], MD  loratadine (CLARITIN) 10 MG tablet Take 1 tablet (10 mg total) by mouth daily. Patient not taking: Reported on 04/23/2019 11/15/18   Magrinat, [name], MD    metoprolol succinate (TOPROL-XL) 50 MG 24 hr tablet Take 1 tablet (50 mg total) by mouth daily. Take with or immediately following a meal. Patient not taking: Reported on 04/23/2019 04/09/19   [name], MD  Omeprazole-Sodium Bicarbonate (ZEGERID) 20-1100 MG CAPS capsule Take 1 capsule by mouth daily before breakfast. 05/31/18   [name], MD  polyethylene glycol (MIRALAX / Floria Raveling) packet Take 17 g by mouth as needed. Patient taking differently: Take 17 g by mouth daily as needed (constipation). Mix in 8 oz liquid and drink 04/23/12   [name], MD     Vital Signs: BP (!) 134/94 (BP Location: Left Arm)    Pulse (!) 121    Temp 98.6 F (37 C) (Oral)    Resp 18    Ht 5\' 2"  (1.575 m)    Wt 173 lb (78.5 kg)    SpO2 97%    BMI 31.64 kg/m   Physical Exam Vitals signs and nursing note reviewed.  Constitutional:      Appearance: She is ill-appearing.     Comments: Somnolent, slow to respond to questions, falls asleep sitting up during conversation. Arouses easily to voice cues.  HENT:     Head: Normocephalic.  Cardiovascular:     Rate and Rhythm: Regular rhythm. Tachycardia present.     Comments: (+) Right port - accessed at time of exam. Dressed appropriately.  Pulmonary:     Effort: Pulmonary effort is normal.  Breath sounds: Normal breath sounds.     Comments: (+) Right PleurX - insertion site clean, dry, intact without leakage or active bleeding noted. Dressed appropriately. Tender to palpation.  Skin:    General: Skin is warm and dry.     Imaging: Dg Chest 2 View  Result Date: 05/02/2019 CLINICAL DATA:  Fever, RIGHT-side pleural drainage catheter placed yesterday EXAM: CHEST - 2 VIEW COMPARISON:  04/25/2019 Correlation: CTA chest 04/23/2019 FINDINGS: RIGHT jugular Port-A-Cath with tip projecting over SVC. New RIGHT PleurX catheter. Enlargement of cardiac silhouette. Known RIGHT infrahilar mass incompletely visualized due to partial obscuration by moderate to large  RIGHT pleural effusion and basilar atelectasis. LEFT lung clear. No pneumothorax. Bone destruction of the anterior RIGHT fourth and fifth ribs. IMPRESSION: Enlargement of cardiac silhouette. Known RIGHT perihilar mass with bone destruction of the anterior RIGHT fourth and fifth ribs. Large RIGHT pleural effusion and significant basilar atelectasis. Electronically Signed   By: Lavonia Dana M.D.   On: 05/02/2019 08:52   Ir Perc Pleural Drain W/indwell Cath W/img Guide  Result Date: 05/01/2019 CLINICAL DATA:  History of metastatic breast cancer, now with recurrent symptomatic malignant right-sided pleural effusion. Request now made for placement of a right-sided tunneled pleural catheter for palliative purposes. EXAM: INSERTION OF TUNNELED RIGHT SIDED PLEURAL DRAINAGE CATHETER COMPARISON:  Ultrasound-guided thoracentesis-04/24/2019; 04/12/2019; chest CTA-04/23/2019 MEDICATIONS: Ancef 2 gm IV; Antibiotic was administered in an appropriate time interval for the procedure. ANESTHESIA/SEDATION: Moderate (conscious) sedation was employed during this procedure. A total of Versed 4 mg and Fentanyl 200 mcg was administered intravenously. Moderate Sedation Time: 16 minutes. The patient's level of consciousness and vital signs were monitored continuously by radiology nursing throughout the procedure under my direct supervision. FLUOROSCOPY TIME:  FLUOROSCOPY TIME 12 seconds (3 mGy) COMPLICATIONS: None immediate. PROCEDURE: The procedure, risks, benefits, and alternatives were explained to the patient, who wish to proceed with the placement of this permanent pleural catheter as the patient is seeking palliative care. The patient understand and consent to the procedure. The right inferior lateral chest and upper abdomen were prepped with Chlorhexidine in a sterile fashion, and a sterile drape was applied covering the operative field. Attention was made to avoid the patient's known right anterior chest wall mass. A sterile gown  and sterile gloves were used for the procedure. Initial ultrasound scanning and fluoroscopic imaging demonstrates a recurrent moderate to large pleural effusion. Under direct ultrasound guidance, the right inferior lateral pleural space was accessed with a Yueh sheath needle after the overlying soft tissues were anesthetized with 1% lidocaine with epinephrine. An Amplatz super stiff wire was then advanced under fluoroscopy into the pleural space. A 15.5 French tunneled Pleur-X catheter was tunneled from an incision within the right upper abdominal quadrant to the access site. The pleural access site was serially dilated under fluoroscopy, ultimately allowing placement of a peel-away sheath. The catheter was advanced through the peel-away sheath. The sheath was then removed. Final catheter positioning was confirmed with a fluoroscopic radiographic image. The access incision was closed with subcutaneous subcuticular 4-0 Vicryl, Dermabond and Steri-Strips. A Prolene retention suture was applied at the catheter exit site. Large volume thoracentesis was performed through the new catheter utilizing provided bulb vacuum assisted drainage bag. The patient tolerated the above procedure well without immediate postprocedural complication. FINDINGS: Preprocedural ultrasound scanning demonstrates a recurrent large sized anechoic right sided pleural effusion. After ultrasound and fluoroscopic guided placement, the catheter is directed right lung apex Following catheter placement, approximately 1.3 L of bloody  pleural fluid was removed. IMPRESSION: Successful placement of permanent, tunneled right pleural drainage catheter via lateral approach. 1.3 liters of bloody pleural fluid was removed after catheter placement. Electronically Signed   By: Sandi Mariscal M.D.   On: 05/01/2019 15:42    Labs:  CBC: Recent Labs    04/29/19 0558 04/30/19 0352 05/01/19 0402 05/02/19 0345  WBC 7.7 7.6 8.3 7.3  HGB 9.5* 9.7* 9.8* 10.0*   HCT 32.1* 33.8* 33.4* 34.6*  PLT 328 347 355 371    COAGS: Recent Labs    11/25/18 1130 04/13/19 2016 05/01/19 0402  INR 0.9 1.1 1.1  APTT  --  36  --     BMP: Recent Labs    04/29/19 0558 04/30/19 0352 05/01/19 0402 05/02/19 0345  NA 143 143 142 140  K 3.6 3.9 3.4* 3.6  CL 107 109 108 106  CO2 26 26 27 23   GLUCOSE 104* 105* 97 113*  BUN 7 7 5* 8  CALCIUM 11.1* 11.2* 10.9* 9.5  CREATININE 0.67 0.62 0.62 0.73  GFRNONAA >60 >60 >60 >60  GFRAA >60 >60 >60 >60    LIVER FUNCTION TESTS: Recent Labs    04/27/19 0414 04/29/19 0558 04/30/19 0352 05/01/19 0402  BILITOT 0.3 0.4 0.4 0.4  AST 9* 10* 10* 11*  ALT 19 19 19 18   ALKPHOS 88 98 105 107  PROT 6.6 6.9 7.2 6.9  ALBUMIN 2.9* 3.0* 3.2* 3.3*    Assessment and Plan:  48 y/o F with metastatic breast cancer and recurrent right malignant pleural effusion s/p PleurX placement yesterday in IR. 1.3 L of pleural fluid was aspirated following placement.  Patient is somnolent today and is somewhat of a poor historian. She does complain of pain in her chest and abdomen but is unable to provide further details. PleurX insertion site unremarkable, she is tender to palpation over insertion site which is to be expected.   PleurX supplies at bedside today on my exam, will ensure floor staff instructs patient on PleurX use prior to discharge. Ideally she will have home health setup prior to discharge to assist with at home management of PleurX - she is unable to tell me today if this has been setup. May use catheter as needed during this admission.   Further management/plans per primary team. Please call IR with any questions or concerns.  Electronically Signed: Joaquim Nam, PA-C 05/02/2019, 10:11 AM   I spent a total of 15 Minutes at the the patient's bedside AND on the patient's hospital floor or unit, greater than 50% of which was counseling/coordinating care for right PleurX placement follow up.

## 2019-05-03 LAB — CBC
HCT: 31.4 % — ABNORMAL LOW (ref 36.0–46.0)
Hemoglobin: 9.1 g/dL — ABNORMAL LOW (ref 12.0–15.0)
MCH: 24.9 pg — ABNORMAL LOW (ref 26.0–34.0)
MCHC: 29 g/dL — ABNORMAL LOW (ref 30.0–36.0)
MCV: 86 fL (ref 80.0–100.0)
Platelets: 334 10*3/uL (ref 150–400)
RBC: 3.65 MIL/uL — ABNORMAL LOW (ref 3.87–5.11)
RDW: 18.6 % — ABNORMAL HIGH (ref 11.5–15.5)
WBC: 10 10*3/uL (ref 4.0–10.5)
nRBC: 0 % (ref 0.0–0.2)

## 2019-05-03 LAB — COMPREHENSIVE METABOLIC PANEL
ALT: 17 U/L (ref 0–44)
AST: 17 U/L (ref 15–41)
Albumin: 2.4 g/dL — ABNORMAL LOW (ref 3.5–5.0)
Alkaline Phosphatase: 78 U/L (ref 38–126)
Anion gap: 8 (ref 5–15)
BUN: 14 mg/dL (ref 6–20)
CO2: 23 mmol/L (ref 22–32)
Calcium: 8.1 mg/dL — ABNORMAL LOW (ref 8.9–10.3)
Chloride: 107 mmol/L (ref 98–111)
Creatinine, Ser: 0.92 mg/dL (ref 0.44–1.00)
GFR calc Af Amer: >60 mL/min (ref >60)
GFR calc non Af Amer: >60 mL/min (ref >60)
Glucose, Bld: 113 mg/dL — ABNORMAL HIGH (ref 70–99)
Potassium: 3.3 mmol/L — ABNORMAL LOW (ref 3.5–5.1)
Sodium: 138 mmol/L (ref 135–145)
Total Bilirubin: 0.4 mg/dL (ref 0.3–1.2)
Total Protein: 5.4 g/dL — ABNORMAL LOW (ref 6.5–8.1)

## 2019-05-03 LAB — PROCALCITONIN: Procalcitonin: 0.63 ng/mL

## 2019-05-03 LAB — MAGNESIUM: Magnesium: 1.7 mg/dL (ref 1.7–2.4)

## 2019-05-03 MED ORDER — POTASSIUM CHLORIDE CRYS ER 20 MEQ PO TBCR
40.0000 meq | EXTENDED_RELEASE_TABLET | Freq: Once | ORAL | Status: AC
  Administered 2019-05-03: 11:00:00 40 meq via ORAL
  Filled 2019-05-03: qty 2

## 2019-05-03 MED ORDER — SODIUM CHLORIDE 0.9 % IV SOLN
INTRAVENOUS | Status: DC
  Administered 2019-05-04 – 2019-05-08 (×2): via INTRAVENOUS

## 2019-05-03 MED ORDER — MAGNESIUM SULFATE 2 GM/50ML IV SOLN
2.0000 g | Freq: Once | INTRAVENOUS | Status: AC
  Administered 2019-05-03: 2 g via INTRAVENOUS
  Filled 2019-05-03: qty 50

## 2019-05-03 NOTE — Progress Notes (Signed)
PROGRESS NOTE  Brittany Rodgers TKP:[id number] DOB: 09-Feb-1971 DOA: 04/23/2019 PCP: [name], MD   LOS: 10 days   Brief narrative: Brittany Rodgers a 48 y.o.femalewith medical history significant ofmetastatic breast cancer s/p mastectomy, presented to ED with complaints of increased shortness of breath and lethargy. Patient was seen recently in Oncology clinic on 7/28, where pt was noted to have new chest wall mass as well as liver mass. Pt was being considered for possible biopsy. On day of admit, pt noted to be increasingly sob with tachycardia and was subseqently referred to ED from Oncology clinic.  In the ED, pt was noted to have HR in excess of 140's, ultimately improved to the 110's with one dose of IV metoprolol. Pt underwent CT chest with findings worrisome for large Right sided effusion. Pt remained on minimal O2 support. IR was consulted by EDP and hospitalist consulted for consideration for admission.  Patient had thoracentesis and biopsy of chest wall mass which showed metastatic disease.  Underwent pleurx placement on 8/6. Tentative chemotherapy to start on 8/11.  Subjective: Patient was seen and examined this morning.  Pleasant middle-aged African-American female.  Sitting up in bed.  Not in distress.  No new symptoms.  Assessment/Plan:  Principal Problem:   Pleural effusion Active Problems:   IBS   Hyperlipidemia   Chronic right hip pain   Malignant neoplasm of lower-outer quadrant of right breast of female, estrogen receptor negative (HCC)   S/P mastectomy   Pleural effusion on right  SIRS with concern for sepsis  Presyncope: Patient was on cefepime until 05/01/2019.  Urinalysis was abnormal but urine culture showed multiple species.  Blood cultures were negative.  Currently not on antibiotics.  Fever and confusion likely metabolic encephalopathy. Episode of fever of 102.7 on 8/7 after right Pleurx catheter was placed.  Likely level is elevated to 2.3.   Repeat blood culture was sent. Previous pleural fluid cultures were negative.   Similar incidence of fever after thoracocentesis in the past.  Will closely monitor for fever trend.  Patient was previously on vancomycin and was on cefepime until 8/5 which was discontinued.  Will monitor off antibiotic for now due to recent prolonged patient administration of antibiotic.  Repeat lactate level tomorrow.  Procalcitonin level continues to rise, 0.63 today.  Repeat procalcitonin level tomorrow.  Malignant right pleural Effusion CT with moderate to large right pleural effusion with associated atelectasis or consolidation, s/p thoracentesis. Cytology consistent with metastatic adenocarcinoma.  Status post right Pleurx catheter placement on 05/01/19.  Patient will need Pleurx catheter teaching and home health nurse on discharge.  Not in obvious distress today.  Stage IV Breast cancer with malignant right pleural effusion, s/p mastectomy Followed by [name], oncology as outpatient.  New chest wall mass and liver lesion with mutliple small bilateral pulm nodules, new or enlarged from prior exam concerning for metastatic disease. Planning for chemo on August 11 if patient remains clinically stable.  Palliative care saw the patient during hospitalization.   Hypokalemia/hypomagnesemia -Potassium level low at 3.3 and magnesium low at 1.7 this morning.  Oral 40 of KCl and IV 2 g of magnesium given.  Hyperlipidemia.  Continue statin  Sinus tachycardia - -heart rate remained mostly between 100-100 20s.  Continue metoprolol.  Chronic pain On MSIR and MS contin.  Requiring IV morphine for additional pain relief.  Hypercalcemia: Likely secondary to malignancy.  As per [name] hemato-oncology patient received pamidronate 90 mg x 1 with improvement in calcium  levels.  Calcium level of 8.1 today.  Debility, weakness.  Seen by physical therapy, occupational therapy.  Recommend home health/outpatient PT  on discharge.  Body mass index is 31.64 kg/m. Mobility: PT evaluation Diet: Regular diet DVT prophylaxis:  Lovenox subcu Code Status:   Code Status: Full Code  Family Communication:  Expected Discharge:  Monitor for any recurrence of fever.  Consultants:  Oncology  Procedures:    Antimicrobials: Anti-infectives (From admission, onward)   Start     Dose/Rate Route Frequency Ordered Stop   05/01/19 0928  ceFAZolin (ANCEF) 2-4 GM/100ML-% IVPB    Note to Pharmacy: [name]   : cabinet override      05/01/19 0928 05/01/19 2129   04/25/19 0400  vancomycin (VANCOCIN) IVPB 750 mg/150 ml premix  Status:  Discontinued     750 mg 150 mL/hr over 60 Minutes Intravenous Every 12 hours 04/24/19 1332 04/25/19 1447   04/24/19 2200  ceFEPIme (MAXIPIME) 2 g in sodium chloride 0.9 % 100 mL IVPB     2 g 200 mL/hr over 30 Minutes Intravenous Every 8 hours 04/24/19 1309 04/30/19 2359   04/24/19 1315  ceFEPIme (MAXIPIME) 2 g in sodium chloride 0.9 % 100 mL IVPB     2 g 200 mL/hr over 30 Minutes Intravenous STAT 04/24/19 1307 04/24/19 1536   04/24/19 1315  vancomycin (VANCOCIN) 1,750 mg in sodium chloride 0.9 % 500 mL IVPB     1,750 mg 250 mL/hr over 120 Minutes Intravenous STAT 04/24/19 1307 04/24/19 1915      Infusions:  . sodium chloride 10 mL/hr at 05/02/19 1656    Scheduled Meds: . atorvastatin  20 mg Oral Daily  . Chlorhexidine Gluconate Cloth  6 each Topical Daily  . docusate sodium  200 mg Oral BID  . enoxaparin (LOVENOX) injection  40 mg Subcutaneous Daily  . metoprolol tartrate  25 mg Oral BID  . morphine  15 mg Oral Q12H  . polyethylene glycol  17 g Oral BID  . senna-docusate  2 tablet Oral BID    PRN meds: sodium chloride, acetaminophen **OR** acetaminophen, benzonatate, ibuprofen, labetalol, morphine, sodium chloride flush   Objective: Vitals:   05/03/19 0512 05/03/19 1035  BP: 113/73   Pulse: 97 (!) 115  Resp: 16   Temp: 98.3 F (36.8 C)   SpO2: 100%      Intake/Output Summary (Last 24 hours) at 05/03/2019 1526 Last data filed at 05/03/2019 0053 Gross per 24 hour  Intake 778.61 ml  Output -  Net 778.61 ml   Filed Weights   04/24/19 0003  Weight: 78.5 kg   Weight change:  Body mass index is 31.64 kg/m.   Physical Exam: General exam: Appears calm and comfortable.  Skin: No rashes, lesions or ulcers. HEENT: Atraumatic, normocephalic, supple neck, no obvious bleeding Lungs: Clear to auscultation bilaterally CVS: Regular rate and rhythm, no murmur GI/Abd soft, nontender, nondistended, bowel sound present CNS: Alert, awake, oriented x3 Psychiatry: Mood appropriate Extremities: No pedal edema, no calf tenderness  Data Review: I have personally reviewed the laboratory data and studies available.  Recent Labs  Lab 04/29/19 0558 04/30/19 0352 05/01/19 0402 05/02/19 0345 05/03/19 0349  WBC 7.7 7.6 8.3 7.3 10.0  NEUTROABS  --   --  6.1  --   --   HGB 9.5* 9.7* 9.8* 10.0* 9.1*  HCT 32.1* 33.8* 33.4* 34.6* 31.4*  MCV 87.7 88.0 86.8 86.7 86.0  PLT 328 347 355 371 334   Recent Labs  Lab 04/27/19 0414 04/29/19 0558 04/30/19 0352 05/01/19 0402 05/02/19 0345 05/03/19 0349  NA 141 143 143 142 140 138  K 3.9 3.6 3.9 3.4* 3.6 3.3*  CL 105 107 109 108 106 107  CO2 26 26 26 27 23 23   GLUCOSE 105* 104* 105* 97 113* 113*  BUN 8 7 7  5* 8 14  CREATININE 0.72 0.67 0.62 0.62 0.73 0.92  CALCIUM 9.7 11.1* 11.2* 10.9* 9.5 8.1*  MG 1.9 1.8 1.9  --  1.6* 1.7    Terrilee Croak, MD  [facility] 05/03/2019

## 2019-05-03 NOTE — Progress Notes (Addendum)
Pt continues with cyclic temps. Paged DR, new order for IVF. Possible oncology contact re patient per DR. Will revert to APAP/IBU cycle for fevers mentioned in previous doc.

## 2019-05-04 LAB — CBC WITH DIFFERENTIAL/PLATELET
Abs Immature Granulocytes: 0.05 10*3/uL (ref 0.00–0.07)
Basophils Absolute: 0 10*3/uL (ref 0.0–0.1)
Basophils Relative: 0 %
Eosinophils Absolute: 0.4 10*3/uL (ref 0.0–0.5)
Eosinophils Relative: 4 %
HCT: 28.6 % — ABNORMAL LOW (ref 36.0–46.0)
Hemoglobin: 8.5 g/dL — ABNORMAL LOW (ref 12.0–15.0)
Immature Granulocytes: 1 %
Lymphocytes Relative: 13 %
Lymphs Abs: 1.2 10*3/uL (ref 0.7–4.0)
MCH: 25.2 pg — ABNORMAL LOW (ref 26.0–34.0)
MCHC: 29.7 g/dL — ABNORMAL LOW (ref 30.0–36.0)
MCV: 84.9 fL (ref 80.0–100.0)
Monocytes Absolute: 1.2 10*3/uL — ABNORMAL HIGH (ref 0.1–1.0)
Monocytes Relative: 12 %
Neutro Abs: 7 10*3/uL (ref 1.7–7.7)
Neutrophils Relative %: 70 %
Platelets: 302 10*3/uL (ref 150–400)
RBC: 3.37 MIL/uL — ABNORMAL LOW (ref 3.87–5.11)
RDW: 18.8 % — ABNORMAL HIGH (ref 11.5–15.5)
WBC: 9.8 10*3/uL (ref 4.0–10.5)
nRBC: 0 % (ref 0.0–0.2)

## 2019-05-04 LAB — BASIC METABOLIC PANEL
Anion gap: 6 (ref 5–15)
BUN: 13 mg/dL (ref 6–20)
CO2: 24 mmol/L (ref 22–32)
Calcium: 7.4 mg/dL — ABNORMAL LOW (ref 8.9–10.3)
Chloride: 112 mmol/L — ABNORMAL HIGH (ref 98–111)
Creatinine, Ser: 0.5 mg/dL (ref 0.44–1.00)
GFR calc Af Amer: >60 mL/min (ref >60)
GFR calc non Af Amer: >60 mL/min (ref >60)
Glucose, Bld: 117 mg/dL — ABNORMAL HIGH (ref 70–99)
Potassium: 3.6 mmol/L (ref 3.5–5.1)
Sodium: 142 mmol/L (ref 135–145)

## 2019-05-04 LAB — PROCALCITONIN: Procalcitonin: 0.46 ng/mL

## 2019-05-04 LAB — LACTIC ACID, PLASMA: Lactic Acid, Venous: 1.7 mmol/L (ref 0.5–1.9)

## 2019-05-04 MED ORDER — GUAIFENESIN-DM 100-10 MG/5ML PO SYRP
5.0000 mL | ORAL_SOLUTION | ORAL | Status: DC | PRN
  Administered 2019-05-04 – 2019-05-05 (×3): 5 mL via ORAL
  Filled 2019-05-04 (×3): qty 10

## 2019-05-04 NOTE — Progress Notes (Signed)
PROGRESS NOTE  Brittany Rodgers FBP:[id number] DOB: [birth year] DOA: 04/23/2019 PCP: [name], MD   LOS: 11 days   Brief narrative: Bosie Helper Shortis a 48 y.o.femalewith medical history significant ofmetastatic breast cancer s/p mastectomy, presented to ED with complaints of increased shortness of breath and lethargy. Patient was seen recently in Oncology clinic on 7/28, where pt was noted to have new chest wall mass as well as liver mass. Pt was being considered for possible biopsy. On day of admission, pt noted to be increasingly sob with tachycardia and was subseqently referred to ED from Oncology clinic.  In the ED, pt was noted to have HR in excess of 140's, ultimately improved to the 110's with one dose of IV metoprolol. Pt underwent CT chest with findings worrisome for large Right sided effusion. Pt remained on minimal O2 support. IR was consulted by EDP.  Patient was admitted under hospitalist service for further evaluation and management.  On 7/30, patient had thoracentesis and biopsy of chest wall mass which showed metastatic disease.  Underwent pleurx placement on 8/6. Tentative  plan to start chemotherapy to start on 8/11.  Subjective: Patient was seen and examined this morning.  Pleasant middle-aged African-American female.  Sitting up in bed. Drowsy and tired today.  She states she did not have a good sleep last night because she was concerned about recurrence of fever.  She had a temperature of 103 yesterday afternoon. Labs from this morning noted.  WBC count improved to 9.8, hemoglobin 8.5.  Assessment/Plan:  Principal Problem:   Pleural effusion Active Problems:   IBS   Hyperlipidemia   Chronic right hip pain   Malignant neoplasm of lower-outer quadrant of right breast of female, estrogen receptor negative (HCC)   S/P mastectomy   Pleural effusion on right  SIRS with concern for sepsis  Presyncope: Patient was on cefepime until 05/01/2019.  Urinalysis was  abnormal but urine culture showed multiple species.  Blood cultures were negative.  Currently not on antibiotics.  Fever and confusion likely metabolic encephalopathy.  -Patient is spiking fever intermittently.  Yesterday, she had a temperature of 103.1 in the afternoon.  This morning, WBC count normal.  Lactic acid level normal.  Procalcitonin trending down.  Hemodynamically stable.  Not on antibiotics.  I wonder if this is cancer fever and not an actual infection.  Monitor for next 24 hours.  Will discuss with oncology tomorrow.   Malignant right pleural Effusion CT with moderate to large right pleural effusion with associated atelectasis or consolidation, s/p thoracentesis. Cytology consistent with metastatic adenocarcinoma.  Status post right Pleurx catheter placement on 05/01/19.  Patient will need Pleurx catheter teaching and home health nurse on discharge.  Not in obvious distress today.  Stage IV Breast cancer with malignant right pleural effusion, s/p mastectomy Followed by [name], oncology as outpatient.  New chest wall mass and liver lesion with mutliple small bilateral pulm nodules, new or enlarged from prior exam concerning for metastatic disease. Planning for chemo on August 11 if patient remains clinically stable.  Palliative care saw the patient during hospitalization.   Hyperlipidemia - Continue statin  Sinus tachycardia - heart rate remained mostly between 100-100 20s.  Continue metoprolol.  Chronic pain - On MSIR and MS contin. PRN IV morphine.  Hypercalcemia: Likely secondary to malignancy.  As per [name] hemato-oncology patient received pamidronate 90 mg x 1 with improvement in calcium levels.  Calcium level of 7.4 today.  Debility, weakness.  Seen by physical  therapy, occupational therapy.  Recommend home health/outpatient PT on discharge.  Body mass index is 31.64 kg/m. Mobility: PT evaluation Diet: Regular diet DVT prophylaxis:  Lovenox subcu Code  Status:   Code Status: Full Code  Family Communication:  Expected Discharge:  Monitor for any recurrence of fever.  Consultants:  Oncology  Procedures:    Antimicrobials: Anti-infectives (From admission, onward)   Start     Dose/Rate Route Frequency Ordered Stop   05/01/19 0928  ceFAZolin (ANCEF) 2-4 GM/100ML-% IVPB    Note to Pharmacy: [name]   : cabinet override      05/01/19 0928 05/01/19 2129   04/25/19 0400  vancomycin (VANCOCIN) IVPB 750 mg/150 ml premix  Status:  Discontinued     750 mg 150 mL/hr over 60 Minutes Intravenous Every 12 hours 04/24/19 1332 04/25/19 1447   04/24/19 2200  ceFEPIme (MAXIPIME) 2 g in sodium chloride 0.9 % 100 mL IVPB     2 g 200 mL/hr over 30 Minutes Intravenous Every 8 hours 04/24/19 1309 04/30/19 2359   04/24/19 1315  ceFEPIme (MAXIPIME) 2 g in sodium chloride 0.9 % 100 mL IVPB     2 g 200 mL/hr over 30 Minutes Intravenous STAT 04/24/19 1307 04/24/19 1536   04/24/19 1315  vancomycin (VANCOCIN) 1,750 mg in sodium chloride 0.9 % 500 mL IVPB     1,750 mg 250 mL/hr over 120 Minutes Intravenous STAT 04/24/19 1307 04/24/19 1915      Infusions:  . sodium chloride 10 mL/hr at 05/02/19 1656  . sodium chloride 75 mL/hr at 05/04/19 3875    Scheduled Meds: . atorvastatin  20 mg Oral Daily  . Chlorhexidine Gluconate Cloth  6 each Topical Daily  . docusate sodium  200 mg Oral BID  . enoxaparin (LOVENOX) injection  40 mg Subcutaneous Daily  . metoprolol tartrate  25 mg Oral BID  . morphine  15 mg Oral Q12H  . polyethylene glycol  17 g Oral BID  . senna-docusate  2 tablet Oral BID    PRN meds: sodium chloride, acetaminophen **OR** acetaminophen, benzonatate, guaiFENesin-dextromethorphan, ibuprofen, labetalol, morphine, sodium chloride flush   Objective: Vitals:   05/04/19 0300 05/04/19 0632  BP:  120/84  Pulse:  (!) 103  Resp:  20  Temp: 97.7 F (36.5 C) (!) 97.5 F (36.4 C)  SpO2:  99%    Intake/Output Summary (Last 24 hours)  at 05/04/2019 1220 Last data filed at 05/04/2019 0533 Gross per 24 hour  Intake 745 ml  Output 500 ml  Net 245 ml   Filed Weights   04/24/19 0003  Weight: 78.5 kg   Weight change:  Body mass index is 31.64 kg/m.   Physical Exam: General exam: Appears drowsy, sleepy this morning.  Not in pain Skin: No rashes, lesions or ulcers. HEENT: Atraumatic, normocephalic, supple neck, no obvious bleeding Lungs: Clear to auscultation bilaterally CVS: Regular rate and rhythm, no murmur GI/Abd soft, nontender, nondistended, bowel sound present CNS: Alert, awake, oriented x3 Psychiatry: Mood appropriate Extremities: No pedal edema, no calf tenderness  Data Review: I have personally reviewed the laboratory data and studies available.  Recent Labs  Lab 04/30/19 0352 05/01/19 0402 05/02/19 0345 05/03/19 0349 05/04/19 0346  WBC 7.6 8.3 7.3 10.0 9.8  NEUTROABS  --  6.1  --   --  7.0  HGB 9.7* 9.8* 10.0* 9.1* 8.5*  HCT 33.8* 33.4* 34.6* 31.4* 28.6*  MCV 88.0 86.8 86.7 86.0 84.9  PLT 347 355 371 334 302  Recent Labs  Lab 04/29/19 0558 04/30/19 0352 05/01/19 0402 05/02/19 0345 05/03/19 0349 05/04/19 0346  NA 143 143 142 140 138 142  K 3.6 3.9 3.4* 3.6 3.3* 3.6  CL 107 109 108 106 107 112*  CO2 26 26 27 23 23 24   GLUCOSE 104* 105* 97 113* 113* 117*  BUN 7 7 5* 8 14 13   CREATININE 0.67 0.62 0.62 0.73 0.92 0.50  CALCIUM 11.1* 11.2* 10.9* 9.5 8.1* 7.4*  MG 1.8 1.9  --  1.6* 1.7  --     Terrilee Croak, MD  [facility] 05/04/2019

## 2019-05-04 NOTE — Progress Notes (Signed)
   05/04/19 2108  MEWS Score  Resp 20  Pulse Rate (!) 131  BP (!) 149/83  Temp (!) 101.8 F (38.8 C)  SpO2 99 %  O2 Device Nasal Cannula  O2 Flow Rate (L/min) 2 L/min  MEWS Score  MEWS RR 0  MEWS Pulse 3  MEWS Systolic 0  MEWS LOC 0  MEWS Temp 2  MEWS Score 5  MEWS Score Color Red  MEWS Assessment  Is this an acute change? No  MEWS guidelines implemented *See Row Information* Red   Pt has PRN labetalol and tylenol. Will give PRNs and notify RRT to keep eye on pt. Will reassess and follow red MEWS.

## 2019-05-05 ENCOUNTER — Inpatient Hospital Stay (HOSPITAL_COMMUNITY): Payer: BC Managed Care – PPO

## 2019-05-05 DIAGNOSIS — Z9011 Acquired absence of right breast and nipple: Secondary | ICD-10-CM

## 2019-05-05 DIAGNOSIS — J910 Malignant pleural effusion: Secondary | ICD-10-CM

## 2019-05-05 DIAGNOSIS — Z888 Allergy status to other drugs, medicaments and biological substances status: Secondary | ICD-10-CM

## 2019-05-05 DIAGNOSIS — Z882 Allergy status to sulfonamides status: Secondary | ICD-10-CM

## 2019-05-05 DIAGNOSIS — Z171 Estrogen receptor negative status [ER-]: Secondary | ICD-10-CM

## 2019-05-05 DIAGNOSIS — R509 Fever, unspecified: Secondary | ICD-10-CM

## 2019-05-05 DIAGNOSIS — K5903 Drug induced constipation: Secondary | ICD-10-CM

## 2019-05-05 DIAGNOSIS — Z9221 Personal history of antineoplastic chemotherapy: Secondary | ICD-10-CM

## 2019-05-05 DIAGNOSIS — R5081 Fever presenting with conditions classified elsewhere: Secondary | ICD-10-CM

## 2019-05-05 LAB — BASIC METABOLIC PANEL
Anion gap: 7 (ref 5–15)
BUN: 6 mg/dL (ref 6–20)
CO2: 24 mmol/L (ref 22–32)
Calcium: 6.9 mg/dL — ABNORMAL LOW (ref 8.9–10.3)
Chloride: 105 mmol/L (ref 98–111)
Creatinine, Ser: 0.45 mg/dL (ref 0.44–1.00)
GFR calc Af Amer: >60 mL/min (ref >60)
GFR calc non Af Amer: >60 mL/min (ref >60)
Glucose, Bld: 112 mg/dL — ABNORMAL HIGH (ref 70–99)
Potassium: 3.6 mmol/L (ref 3.5–5.1)
Sodium: 136 mmol/L (ref 135–145)

## 2019-05-05 LAB — CBC WITH DIFFERENTIAL/PLATELET
Abs Immature Granulocytes: 0.05 10*3/uL (ref 0.00–0.07)
Basophils Absolute: 0 10*3/uL (ref 0.0–0.1)
Basophils Relative: 0 %
Eosinophils Absolute: 0.2 10*3/uL (ref 0.0–0.5)
Eosinophils Relative: 2 %
HCT: 30 % — ABNORMAL LOW (ref 36.0–46.0)
Hemoglobin: 8.9 g/dL — ABNORMAL LOW (ref 12.0–15.0)
Immature Granulocytes: 1 %
Lymphocytes Relative: 15 %
Lymphs Abs: 1.2 10*3/uL (ref 0.7–4.0)
MCH: 25.1 pg — ABNORMAL LOW (ref 26.0–34.0)
MCHC: 29.7 g/dL — ABNORMAL LOW (ref 30.0–36.0)
MCV: 84.7 fL (ref 80.0–100.0)
Monocytes Absolute: 0.7 10*3/uL (ref 0.1–1.0)
Monocytes Relative: 8 %
Neutro Abs: 6.2 10*3/uL (ref 1.7–7.7)
Neutrophils Relative %: 74 %
Platelets: 366 10*3/uL (ref 150–400)
RBC: 3.54 MIL/uL — ABNORMAL LOW (ref 3.87–5.11)
RDW: 18.9 % — ABNORMAL HIGH (ref 11.5–15.5)
WBC: 8.4 10*3/uL (ref 4.0–10.5)
nRBC: 0 % (ref 0.0–0.2)

## 2019-05-05 LAB — MAGNESIUM: Magnesium: 1.7 mg/dL (ref 1.7–2.4)

## 2019-05-05 LAB — PHOSPHORUS: Phosphorus: 1.3 mg/dL — ABNORMAL LOW (ref 2.5–4.6)

## 2019-05-05 MED ORDER — SODIUM CHLORIDE 0.9 % IV BOLUS
500.0000 mL | Freq: Once | INTRAVENOUS | Status: AC
  Administered 2019-05-05: 500 mL via INTRAVENOUS

## 2019-05-05 MED ORDER — VANCOMYCIN HCL IN DEXTROSE 750-5 MG/150ML-% IV SOLN
750.0000 mg | Freq: Two times a day (BID) | INTRAVENOUS | Status: DC
  Administered 2019-05-05 – 2019-05-08 (×6): 750 mg via INTRAVENOUS
  Filled 2019-05-05 (×7): qty 150

## 2019-05-05 MED ORDER — ACETAMINOPHEN 325 MG PO TABS
325.0000 mg | ORAL_TABLET | Freq: Once | ORAL | Status: AC
  Administered 2019-05-05: 325 mg via ORAL
  Filled 2019-05-05: qty 1

## 2019-05-05 MED ORDER — PIPERACILLIN-TAZOBACTAM 3.375 G IVPB
3.3750 g | Freq: Three times a day (TID) | INTRAVENOUS | Status: DC
  Administered 2019-05-05 – 2019-05-08 (×10): 3.375 g via INTRAVENOUS
  Filled 2019-05-05 (×11): qty 50

## 2019-05-05 MED ORDER — MORPHINE SULFATE ER 30 MG PO TBCR
30.0000 mg | EXTENDED_RELEASE_TABLET | Freq: Two times a day (BID) | ORAL | Status: DC
  Administered 2019-05-05 – 2019-05-11 (×10): 30 mg via ORAL
  Filled 2019-05-05 (×13): qty 1

## 2019-05-05 MED ORDER — VANCOMYCIN HCL 10 G IV SOLR
1500.0000 mg | Freq: Once | INTRAVENOUS | Status: AC
  Administered 2019-05-05: 1500 mg via INTRAVENOUS
  Filled 2019-05-05: qty 1500

## 2019-05-05 MED ORDER — MAGNESIUM HYDROXIDE 400 MG/5ML PO SUSP: 30.0000 mL | Freq: Every day | ORAL | Status: DC | PRN

## 2019-05-05 NOTE — Progress Notes (Signed)
PROGRESS NOTE  Brittany Rodgers  DOB: 1970-11-01  PCP: [name], MD QIH:[id number]  DOA: 04/23/2019  LOS: 12 days   Brief narrative: Brittany Rodgers is a 48 y.o. female with PMH of metastatic breast cancer (invasive ductal carcinoma) s/p right mastectomy. Patient presented to the ED on 04/23/2019 with complaints of increased shortness of breath and lethargy.  Previous day, on 7/28, patient was seen the previous day in Oncology clinic, and was noted to have new chest wall mass as well as liver mass. Pt was being considered for possible biopsy as an outpatient.  Next day, patient became increasingly Grieder of breath and tachycardic.  She was directed to ED for inpatient admission and management.  In the ED, patient was hypoxic and tachycardic up to 140s. CT scan of chest on admission showed findings worrisome for large right sided effusion. Patient was admitted under hospitalist service for further evaluation and management. Oncology and IR consult was obtained. Next day, on 7/30, patient underwent thoracentesis and biopsy of chest wall mass.  Fluid cytology and biopsy of the mass positive for metastatic disease. On 8/6, patient underwent Pleurx catheter placement on right side.  Her hospitalization got prolonged because of intermittent high fever and continued shortness of breath. In last 24 hours, she is more hypoxic and tachycardic.  Subjective: Patient was seen and examined this morning.  Pleasant middle-aged African-American female.  Sitting up in bed.  Drowsy during conversation.  On oxygen supplementation by nasal cannula.  Tachycardic. Complains of pain worsening on the right axilla. T-max 103 last 24 hours.  Reports constipation.  Assessment/Plan:  Principal Problem:   Pleural effusion Active Problems:   IBS   Hyperlipidemia   Chronic right hip pain   Malignant neoplasm of lower-outer quadrant of right breast of female, estrogen receptor negative (HCC)   S/P  mastectomy   Pleural effusion on right  SIRS with acute metabolic encephalopathy  -Patient on admission was started on broad-spectrum antibiotics.  No source identified.  Antibiotics stopped on 8/6.  Continue to spike intermittent high fever.  However, WBC, lactic acid level normal, procalcitonin level continue to trend down.  -Over the weekend, I presumed intermittent fever spike was related to cancer.  However, patient is getting tachycardic and more drowsy in last 24 hours.  T-max 103 in last 24 hours.  -This morning, I sent for blood culture, urinalysis, chest x-ray and started on broad-spectrum antibiotic coverage with IV Zosyn and IV vancomycin.  ID consult called as well.  Metastatic breast cancer (invasive ductal carcinoma), status post right mastectomy  Recent diagnosis of metastasis to chest wall and right pleural effusion -Followed by [name], oncology as outpatient. -Pleurx catheter was placed on 8/6 but apparently there was no order to drain it.  So patient did not get it drained for last 4 days.  Large right pleural effusion noted on repeat chest x-ray today. Has been ordered to drain Pleurx catheter every other day. -Noted a plan from oncology to start on chemotherapy from 8/11.  Right axillary pain -At the site of metastasis to anterior right chest wall and pectoralis muscle.  Patient is on MSIR and MS contin for chronic pain.PRN IV morphine.  Sinus tachycardia -mostly over 120s in last 24 hours.  Likely related to large right pleural effusion.  Monitor.  Constipation -Stool softeners ordered.  Also on MiraLAX twice daily.  Hyperlipidemia - on statin.  At risk of liver injury due to metastasis and chemotherapy.  No real benefit  of statin because of coexisting metastatic cancer.  I would stop statin.  Hypercalcemia:Likely secondary to malignancy.As perDr. Jana Hakim hemato-oncology patient receivedpamidronate 90 mg x 1 with improvement in calcium levels. Calcium  level decreasing now, 6.9 today.  Will order for repeat calcium level tomorrow as well as albumin level.  Debility, weakness. Seen by physical therapy, occupational therapy. Recommend home health/outpatient PT on discharge.  Mobility: PT eval.  Increase ambulation Diet: Regular diet DVT prophylaxis:  Lovenox subcu Code Status:   Code Status: Full Code  Family Communication:  Expected Discharge:  Pending clinical course.  Consultants:  Oncology, IR  Procedures: 7/30 - thoracentesis and biopsy of chest wall mass.  8/6 - Pleurx catheter placement on right side.   Antimicrobials: Anti-infectives (From admission, onward)   Start     Dose/Rate Route Frequency Ordered Stop   05/05/19 2200  vancomycin (VANCOCIN) IVPB 750 mg/150 ml premix     750 mg 150 mL/hr over 60 Minutes Intravenous Every 12 hours 05/05/19 0955     05/05/19 1000  piperacillin-tazobactam (ZOSYN) IVPB 3.375 g     3.375 g 12.5 mL/hr over 240 Minutes Intravenous Every 8 hours 05/05/19 0955     05/05/19 1000  vancomycin (VANCOCIN) 1,500 mg in sodium chloride 0.9 % 500 mL IVPB     1,500 mg 250 mL/hr over 120 Minutes Intravenous  Once 05/05/19 0955 05/05/19 1229   05/01/19 0928  ceFAZolin (ANCEF) 2-4 GM/100ML-% IVPB    Note to Pharmacy: [name]   : cabinet override      05/01/19 0928 05/01/19 2129   04/25/19 0400  vancomycin (VANCOCIN) IVPB 750 mg/150 ml premix  Status:  Discontinued     750 mg 150 mL/hr over 60 Minutes Intravenous Every 12 hours 04/24/19 1332 04/25/19 1447   04/24/19 2200  ceFEPIme (MAXIPIME) 2 g in sodium chloride 0.9 % 100 mL IVPB     2 g 200 mL/hr over 30 Minutes Intravenous Every 8 hours 04/24/19 1309 04/30/19 2359   04/24/19 1315  ceFEPIme (MAXIPIME) 2 g in sodium chloride 0.9 % 100 mL IVPB     2 g 200 mL/hr over 30 Minutes Intravenous STAT 04/24/19 1307 04/24/19 1536   04/24/19 1315  vancomycin (VANCOCIN) 1,750 mg in sodium chloride 0.9 % 500 mL IVPB     1,750 mg 250 mL/hr over 120  Minutes Intravenous STAT 04/24/19 1307 04/24/19 1915      Infusions:  . sodium chloride 10 mL/hr at 05/02/19 1656  . sodium chloride 75 mL/hr at 05/04/19 0622  . piperacillin-tazobactam (ZOSYN)  IV 3.375 g (05/05/19 1057)  . vancomycin      Scheduled Meds: . atorvastatin  20 mg Oral Daily  . Chlorhexidine Gluconate Cloth  6 each Topical Daily  . docusate sodium  200 mg Oral BID  . enoxaparin (LOVENOX) injection  40 mg Subcutaneous Daily  . metoprolol tartrate  25 mg Oral BID  . morphine  15 mg Oral Q12H  . polyethylene glycol  17 g Oral BID  . senna-docusate  2 tablet Oral BID    PRN meds: sodium chloride, acetaminophen **OR** acetaminophen, benzonatate, guaiFENesin-dextromethorphan, ibuprofen, labetalol, magnesium hydroxide, morphine, sodium chloride flush   Objective: Vitals:   05/05/19 0627 05/05/19 0649  BP:    Pulse: (!) 120 (!) 122  Resp: (!) 26 (!) 26  Temp:  99 F (37.2 C)  SpO2:      Intake/Output Summary (Last 24 hours) at 05/05/2019 1308 Last data filed at 05/05/2019 1200 Gross per  24 hour  Intake 240 ml  Output 1050 ml  Net -810 ml   Filed Weights   04/24/19 0003  Weight: 78.5 kg   Weight change:  Body mass index is 31.64 kg/m.   Physical Exam: General exam: Middle-aged African-American female.  Propped up in bed.  Mild to moderate distress because of shortness of breath and pain Skin: No rashes, lesions or ulcers. HEENT: Atraumatic, normocephalic, supple neck, no obvious bleeding Lungs: Large pleural effusion on the right.  No air entry.  No crackles or wheezing on the left. CVS: Tachycardic, no murmur GI/Abd soft, nontender, nondistended, bowel sound present CNS: Alert, awake, oriented x3 Psychiatry: Depressed mood Extremities: No pedal edema, no calf tenderness  Data Review: I have personally reviewed the laboratory data and studies available.  Recent Labs  Lab 05/01/19 0402 05/02/19 0345 05/03/19 0349 05/04/19 0346 05/05/19 0416   WBC 8.3 7.3 10.0 9.8 8.4  NEUTROABS 6.1  --   --  7.0 6.2  HGB 9.8* 10.0* 9.1* 8.5* 8.9*  HCT 33.4* 34.6* 31.4* 28.6* 30.0*  MCV 86.8 86.7 86.0 84.9 84.7  PLT 355 371 334 302 366   Recent Labs  Lab 04/29/19 0558 04/30/19 0352 05/01/19 0402 05/02/19 0345 05/03/19 0349 05/04/19 0346 05/05/19 0416  NA 143 143 142 140 138 142 136  K 3.6 3.9 3.4* 3.6 3.3* 3.6 3.6  CL 107 109 108 106 107 112* 105  CO2 26 26 27 23 23 24 24   GLUCOSE 104* 105* 97 113* 113* 117* 112*  BUN 7 7 5* 8 14 13 6   CREATININE 0.67 0.62 0.62 0.73 0.92 0.50 0.45  CALCIUM 11.1* 11.2* 10.9* 9.5 8.1* 7.4* 6.9*  MG 1.8 1.9  --  1.6* 1.7  --  1.7  PHOS  --   --   --   --   --   --  1.3*    Terrilee Croak, MD  [facility] 05/05/2019

## 2019-05-05 NOTE — Progress Notes (Addendum)
[place]   DOB:[birth year]   ER#:[id number]   EHU#:[id number]  Subjective:  Brittany Rodgers is still Vos of breath.  Pleurx catheter has not been drained since placement.  Has a fever up to 103 earlier this morning.  Denies chest discomfort.  Denies productive cough.  Reports constipation.  Objective: young [place] American woman who appears comfortable in bed. Vitals:   05/05/19 0627 05/05/19 0649  BP:    Pulse: (!) 120 (!) 122  Resp: (!) 26 (!) 26  Temp:  99 F (37.2 C)  SpO2:      Body mass index is 31.64 kg/m.  Intake/Output Summary (Last 24 hours) at 05/05/2019 1137 Last data filed at 05/04/2019 1952 Gross per 24 hour  Intake 480 ml  Output 400 ml  Net 80 ml   General: Awake and alert, appears drowsy at times.  Appears [place] of breath Skin: No rashes HEENT: No thrush or mucositis Lungs: Diminished breath sounds  Cardiovascular: Tachycardic, no murmur.  No lower extremity edema. Abdomen: Positive bowel sounds, soft, nontender Neuro: Alert and oriented x3   CBG (last 3)  No results for input(s): GLUCAP in the last 72 hours.   Labs:  Lab Results  Component Value Date   WBC 8.4 05/05/2019   HGB 8.9 (L) 05/05/2019   HCT 30.0 (L) 05/05/2019   MCV 84.7 05/05/2019   PLT 366 05/05/2019   NEUTROABS 6.2 05/05/2019    @LASTCHEMISTRY @  Urine Studies No results for input(s): UHGB, CRYS in the last 72 hours.  Invalid input(s): UACOL, UAPR, USPG, UPH, UTP, UGL, [place], UBIL, UNIT, UROB, [place], UEPI, UWBC, Junie Panning [place], [place], [place]  Basic Metabolic Panel: Recent Labs  Lab 04/29/19 0558 04/30/19 0352 05/01/19 0402 05/02/19 0345 05/03/19 0349 05/04/19 0346 05/05/19 0416  NA 143 143 142 140 138 142 136  K 3.6 3.9 3.4* 3.6 3.3* 3.6 3.6  CL 107 109 108 106 107 112* 105  CO2 26 26 27 23 23 24 24   GLUCOSE 104* 105* 97 113* 113* 117* 112*  BUN 7 7 5* 8 14 13 6   CREATININE 0.67 0.62 0.62 0.73 0.92 0.50 0.45  CALCIUM 11.1* 11.2* 10.9* 9.5 8.1* 7.4* 6.9*  MG 1.8 1.9  --   1.6* 1.7  --  1.7  PHOS  --   --   --   --   --   --  1.3*   GFR Estimated Creatinine Clearance: 82.6 mL/min (by C-G formula based on SCr of 0.45 mg/dL). Liver Function Tests: Recent Labs  Lab 04/29/19 0558 04/30/19 0352 05/01/19 0402 05/03/19 0349  AST 10* 10* 11* 17  ALT 19 19 18 17   ALKPHOS 98 105 107 78  BILITOT 0.4 0.4 0.4 0.4  PROT 6.9 7.2 6.9 5.4*  ALBUMIN 3.0* 3.2* 3.3* 2.4*   No results for input(s): LIPASE, AMYLASE in the last 168 hours. No results for input(s): AMMONIA in the last 168 hours. Coagulation profile Recent Labs  Lab 05/01/19 0402  INR 1.1    CBC: Recent Labs  Lab 05/01/19 0402 05/02/19 0345 05/03/19 0349 05/04/19 0346 05/05/19 0416  WBC 8.3 7.3 10.0 9.8 8.4  NEUTROABS 6.1  --   --  7.0 6.2  HGB 9.8* 10.0* 9.1* 8.5* 8.9*  HCT 33.4* 34.6* 31.4* 28.6* 30.0*  MCV 86.8 86.7 86.0 84.9 84.7  PLT 355 371 334 302 366   Cardiac Enzymes: No results for input(s): CKTOTAL, CKMB, CKMBINDEX, TROPONINI in the last 168 hours. BNP: Invalid input(s): POCBNP CBG: No results  for input(s): GLUCAP in the last 168 hours. D-Dimer No results for input(s): DDIMER in the last 72 hours. Hgb A1c No results for input(s): HGBA1C in the last 72 hours. Lipid Profile No results for input(s): CHOL, HDL, LDLCALC, TRIG, CHOLHDL, LDLDIRECT in the last 72 hours. Thyroid function studies No results for input(s): TSH, T4TOTAL, T3FREE, THYROIDAB in the last 72 hours.  Invalid input(s): FREET3 Anemia work up No results for input(s): VITAMINB12, FOLATE, FERRITIN, TIBC, IRON, RETICCTPCT in the last 72 hours. Microbiology Recent Results (from the past 240 hour(s))  Novel Coronavirus, NAA (hospital order; send-out to ref lab)     Status: None   Collection Time: 04/29/19 12:02 PM   Specimen: Nasopharyngeal Swab; Respiratory  Result Value Ref Range Status   SARS-CoV-2, NAA NOT DETECTED NOT DETECTED Final    Comment: (NOTE) This test was developed and its performance  characteristics determined by [employer]. This test has not been FDA cleared or approved. This test has been authorized by FDA under an Emergency Use Authorization (EUA). This test is only authorized for the duration of time the declaration that circumstances exist justifying the authorization of the emergency use of in vitro diagnostic tests for detection of SARS-CoV-2 virus and/or diagnosis of COVID-19 infection under section 564(b)(1) of the Act, 21 U.S.C. 470JGG-8(Z)(6), unless the authorization is terminated or revoked sooner. When diagnostic testing is negative, the possibility of a false negative result should be considered in the context of a patient's recent exposures and the presence of clinical signs and symptoms consistent with COVID-19. An individual without symptoms of COVID-19 and who is not shedding SARS-CoV-2 virus would expect to have a negative (not detected) result in this assay. Performed  At: [facility] [place], [place] [id number] Rush Farmer MD TK:[id number]    [place]  Final    Comment: Performed at [facility] [address] [place], [place] [address]  Culture, blood (routine x 2)     Status: None (Preliminary result)   Collection Time: 05/02/19  7:44 AM   Specimen: BLOOD  Result Value Ref Range Status   Specimen Description   Final    BLOOD LEFT ARM Performed at [facility] [address] [place], [place] [address]    Special Requests   Final    BOTTLES DRAWN AEROBIC AND ANAEROBIC Blood Culture adequate volume Performed at [facility] [address], [place], [place] [address]    Culture   Final    NO GROWTH 2 DAYS Performed at [facility] [address] [place], [place] [address]    Report Status PENDING  Incomplete  Culture, blood (routine x 2)     Status: None (Preliminary result)   Collection Time: 05/02/19   7:44 AM   Specimen: BLOOD LEFT HAND  Result Value Ref Range Status   Specimen Description   Final    BLOOD LEFT HAND Performed at [facility] [address], [place], [place] [address]    Special Requests   Final    BOTTLES DRAWN AEROBIC AND ANAEROBIC Blood Culture adequate volume Performed at [facility] [address] [place], [place] [address]    Culture   Final    NO GROWTH 2 DAYS Performed at [facility] [address], [place],  [address]    Report Status PENDING  Incomplete      Studies:  Dg Chest [facility] [address]  Result Date: 05/05/2019 CLINICAL DATA:  Sepsis. EXAM: PORTABLE CHEST 1 VIEW COMPARISON:  Radiographs of May 02, 2019. FINDINGS: Stable cardiomediastinal silhouette. Right-sided chest tube is unchanged in position. Right internal jugular Port-A-Cath is unchanged. Large right pleural effusion is noted which is significantly increased in size compared to prior exam. Underlying atelectasis or infiltrate cannot be excluded. Left lung is clear. Bony thorax is unremarkable. No pneumothorax is noted. IMPRESSION: Large right pleural effusion is noted which is enlarged compared to prior exam. Stable position of right-sided chest tube. Electronically Signed   By: Marijo Conception M.D.   On: 05/05/2019 09:17    Assessment: 48 y.o. Brittany Rodgers, [place] woman status post right breast lower outer quadrant biopsy 10/29/2018 for a clinical T2 N0, stage IIB invasive ductal carcinoma, grade 3, triple negative, with an MIB-1 of 80%             (a) CT of the chest 11/15/2018 shows possible liver metastases, but liver biopsy 11/25/2018 consistent with hemangioma             (b) bone scan 11/15/2018 shows no suspicious bone lesions  (1) neoadjuvant chemotherapy consisting of doxorubicin and cyclophosphamide in dose dense fashion x4, started 11/26/2018, completed 01/07/2019, followed by weekly paclitaxel and carboplatin x12 starting  01/28/2019  (2) patient desires ovarian function preservation:              (a) Zoladex started 11/08/2018, discontinued 02/04/2019             (b) patient is status post bilateral tubal ligation  (3) status post right mastectomy with sentinel lymph node sampling 03/04/2019 for a ypTac ypN1a invasive ductal carcinoma grade 3, repeat prognostic panel again triple negative             (a) a total of 6 axillary lymph nodes removed, 2 sentinel ones, 1 positive  (4) adjuvant radiation to be considered  (5) genetics counseling on 11/28/2018, testing was recommended, but declined by patient.  METASTATIC DISEASE: July 2020 (6) Staging studies:             (a) chest CT scan and bone scan 11/15/2018 show a liver mass, no lung or bone lesions             (b) liver biopsy 11/25/2018 shows a hemangioma             (c) chest CT Angio 03/26/2019 again shows liver mass, right breast "seroma"  (d) cytology from R thoracentesis 04/12/2019 nondiagnostic  (e) CT angio 04/23/2019 shows recurrent right effusion, multiple bilateral pulmonary nodules, invasive anterior right chest wall lesion  (7) pembrolizumab started 04/02/2019, to be repeated every 21 days  (8) CT angio 04/23/2019 shows new lung nodules, likely metastatic  (9) repeat thoracentesis 04/24/2019-- cytology positive  (10) biopsy R chest wall mass 04/24/2019 confirms recurrent/metastatic breast cancer  (a) CARIS study results pending  (11) to start carboplatin and gemcitabine when stable, to be repeated days 1 and 8 of each 21-day cycle    Plan:  Brittany Rodgers is still [place] of breath.  She has a Pleurx catheter that has been placed, but has not yet been drained.  Repeat chest x-ray showed a large right pleural effusion which has enlarged compared to the prior exam.  I have placed an order for the Pleurx to be drained every other day.  She had a fever up to 103 overnight.  Repeat blood cultures were drawn and are pending.  Prior blood  cultures are negative to  date.  Repeat chest x-ray shows a large right pleural effusion which has enlarged compared to the prior exam.  On Zosyn and vancomycin.  Consider infectious disease consult for evaluation of her fevers.  She is having constipation secondary to her narcotic pain medication.  She is on MiraLAX daily in addition to Senokot S2 tablets twice a day.  We will start the patient on milk of magnesia daily as needed.  We will plan to start the patient on carboplatin and gemcitabine, hopefully later this week.  This will be given on day 1 and day 8 of each 21-day cycle.  She will also continue on pembrolizumab.  Brittany Bussing, NP 05/05/2019  11:37 AM Medical Oncology and Hematology [facility] [address] [place], [place] [address] Tel. [phone]    Fax. [phone]   ADDENDUM: Brittany Rodgers still uncomfortable.  She tells me her pain particularly in the right chest wall and right shoulder area is not well controlled.  She is constipated.  She is [place] of breath.  She continues to have high fevers.  She is not neutropenic.  I will write for drainage of the Pleurx catheter every 48 hours, maximum 500 cc.  In my experience tumor fever seldom rises above 101 degrees.  While this certainly could be tumor fever, this is a diagnosis of exclusion and we might want to consider an infectious disease consult to make sure we are not missing something, despite the multiple negative cultures.  She had stopped taking her bowel prophylaxis regimen.  She will be severely constipated if she does not continue it.  I urged her to go ahead and take her medicines as prescribed.  I personally saw this patient and performed a substantive portion of this encounter with the listed APP documented above.   Chauncey Cruel, MD Medical Oncology and Hematology [facility] [address] [place], [place] [address] Tel. [phone]    Fax. [phone]

## 2019-05-05 NOTE — Progress Notes (Signed)
Occupational Therapy Treatment Patient Details Name: Brittany Rodgers MRN: [id number] DOB: 05-17-1971 Today's Date: 05/05/2019    History of present illness Brittany Rodgers is a 48 y.o. female with medical history significant of metastatic breast cancer s/p mastectomy. She presented to ED on 04/23/19 with complaints of increased sob and lethargy. Found to have pleural effusion,SIRS with acute metabolic encephalopathy   OT comments  This 48 yo female admitted with above presents to acute OT with confusion today (that I did not note on any other therapy notes--dicussed with RN) thus not really making progress towards goals. We will continue to follow.   Follow Up Recommendations  Outpatient OT;Supervision/Assistance - 24 hour(some confusion today--if does not clear may need different venue if not 24 hour care)    Equipment Recommendations  None recommended by OT       Precautions / Restrictions Precautions Precautions: Fall Precaution Comments: R recent mastectomy Restrictions Weight Bearing Restrictions: No Other Position/Activity Restrictions: WBAT       Mobility Bed Mobility Overal bed mobility: Needs Assistance Bed Mobility: Supine to Sit;Sit to Supine     Supine to sit: Supervision;HOB elevated(increased time) Sit to supine: Supervision(HOB flat, normal time)      Transfers Overall transfer level: Needs assistance Equipment used: None Transfers: Sit to/from [facility] Sit to Stand: Min guard Stand pivot transfers: Min guard            Balance Overall balance assessment: Needs assistance Sitting-balance support: No upper extremity supported;Feet supported Sitting balance-Leahy Scale: Good     Standing balance support: No upper extremity supported;During functional activity Standing balance-Leahy Scale: Fair                             ADL either performed or assessed with clinical judgement   ADL Overall ADL's : Needs  assistance/impaired                         Toilet Transfer: Min guard;BSC   Toileting- Clothing Manipulation and Hygiene: Min guard;Sit to/from stand               Vision Patient Visual Report: No change from baseline            Cognition Arousal/Alertness: Lethargic;Suspect due to medications(however RN reports that pt has been in and out of it all day) Behavior During Therapy: Flat affect Overall Cognitive Status: Impaired/Different from baseline Area of Impairment: Orientation;Memory;Following commands;Safety/judgement                 Orientation Level: Place(see general comments)   Memory: Decreased [place]-term memory Following Commands: Follows one step commands with increased time Safety/Judgement: Decreased awareness of safety     General Comments: Slow to respond. Had a hard time initally answering, "where are we right now" (started telling me what she had done in therapy at OP), redirected her to "what building are we in right now" ( started telling me where OP was), she asked me several times that if our appointment was right now (she finally told me were were at [facility] She was oriented to year and month, but not exactly why she was here.        Exercises Other Exercises Other Exercises: Pt completed 10 reps of hands together and lift towards overhead and 10 reps of shoulder retraction/protraction           Pertinent Vitals/ Pain  Pain Assessment: No/denies pain         Frequency  Min 2X/week        Progress Toward Goals  OT Goals(current goals can now be found in the care plan section)  Progress towards OT goals: Not progressing toward goals - comment(due to lethargy and confusion)     Plan Discharge plan remains appropriate       AM-PAC OT "6 Clicks" Daily Activity     Outcome Measure   Help from another person eating meals?: None Help from another person taking care of personal grooming?: A Little Help from another  person toileting, which includes using toliet, bedpan, or urinal?: A Little Help from another person bathing (including washing, rinsing, drying)?: A Little Help from another person to put on and taking off regular upper body clothing?: A Little Help from another person to put on and taking off regular lower body clothing?: A Little 6 Click Score: 19    End of Session Equipment Utilized During Treatment: (BSC)  OT Visit Diagnosis: Unsteadiness on feet (R26.81);Other abnormalities of gait and mobility (R26.89);Other symptoms and signs involving cognitive function   Activity Tolerance Patient limited by lethargy   Patient Left in bed;with call bell/phone within reach;with bed alarm set   Nurse Communication (pt urinated in [facility], pt seems out of it today, temp 100.1, had O2 off when I entered with sats 92% and HR 125, placed pt back on 2 liters of O2 (on on wall but pt had removed [place]))        Time: 1258-1330 OT Time Calculation (min): 32 min  Charges: OT General Charges $OT Visit: 1 Visit OT Treatments $Self Care/Home Management : 23-37 mins  [place], OTR/L Acute [employer] Pager [phone] Office [phone]      Almon Register 05/05/2019, 1:51 PM

## 2019-05-05 NOTE — Progress Notes (Signed)
Pharmacy Antibiotic Note  Brittany Rodgers is a 48 y.o. female with a history of metastatic breast cancer s/p mastectomy, admitted on 04/23/19 with pleural effusion. Pt completed 2 days of vancomycin and 7 days of cefepime for concern of infection/sepsis s/p thoracentesis. Urine cultures on 7/30 showed many species present. Pt spiked a fever this morning, MD concerned for sepsis. Blood cultures negative. Pharmacy has been consulted for vancomycin and Zosyn dosing.  Today, 05/05/19:  - Scr is WNL, however trending down  - CBC stable  - febrile the AM, Tmax 103  Plan: - Vancomycin 1500 mg IV x 1  - Vancomycin 750 mg q12h for expected AUC of 488 - based on Scr 0.8 and Vd 0.65 - Zosyn 3.375 mg q8h  - Monitor renal function, cultures and pts clinical status   Height: 5\' 2"  (157.5 cm) Weight: 173 lb (78.5 kg) IBW/kg (Calculated) : 50.1  Temp (24hrs), Avg:99.8 F (37.7 C), Min:97.4 F (36.3 C), Max:103 F (39.4 C)  Recent Labs  Lab 05/01/19 0402 05/02/19 0345 05/02/19 1402 05/03/19 0349 05/04/19 0346 05/05/19 0416  WBC 8.3 7.3  --  10.0 9.8 8.4  CREATININE 0.62 0.73  --  0.92 0.50 0.45  LATICACIDVEN  --   --  2.3*  --  1.7  --     Estimated Creatinine Clearance: 82.6 mL/min (by C-G formula based on SCr of 0.45 mg/dL).    Allergies  Allergen Reactions  . Barbiturates Other (See Comments)    Keeps pt awake  . Methocarbamol Itching  . Sulfonamide Derivatives Itching    Antimicrobials this admission: 7/30 Vancomycin >> 7/31 7/30 Cefepime >> 8/5 8/10 Vancomycin >> 8/10 Zosyn >>  Microbiology results: 8/4 Covid send-out: sent  7/30 Pleural fluid AFB/Cx: neg/sent 7/30 pleural fluid cx: ng-final 7/30 BCx:ng-final 7/30UCx: mulp sp, final 7/29 COVID: neg 7/20 MRSA PCR neg  Thank you for allowing pharmacy to be a part of this patient's care.  Jonathon Bellows, PharmD Candidate 05/05/2019 9:56 AM

## 2019-05-05 NOTE — Progress Notes (Addendum)
Pt's VS this AM are off. MEWS is in the red at an 8. RRT notified and Gilmer Mor advised this RN to notify TR[facility] to see if on-call would like to do anything. Bodenheimer notified, PRN tylenol and labetalol will be given by this RN and reassessed. Bodenheimer ordered additional 325mg  tylenol and a 500cc bolus. Will continue to monitor.

## 2019-05-05 NOTE — Progress Notes (Signed)
Drained pts pleurX with RR RN . Pt tolerated well with no s/s of discomfort. Pt Began to cough during the procedure and tubing clamped and discontinued, with site being redressed. Fluid drained is dark red in color. Output recorded in chart.

## 2019-05-05 NOTE — Consult Note (Signed)
[facility]  Total days of antibiotics 7               Reason for Consult: fever of unknown origin    Referring Physician: dahal  Principal Problem:   Pleural effusion Active Problems:   IBS   Hyperlipidemia   Chronic right hip pain   Malignant neoplasm of lower-outer quadrant of right breast of female, estrogen receptor negative (HCC)   S/P mastectomy   Pleural effusion on right    HPI: Brittany Rodgers is a 48 y.o. 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  She underwent a right mastectomy on 03/04/2019.  Pembrolizumab was started on 04/02/2019.  A prior CT scan showed a possible liver metastasis but liver biopsy on 11/25/2018 showed hemangioma. She has been feeling poorly with worsening shortness of breath and tachycardia in mid late July for which had thoracentesis but only showed atypical cells(7/18). She was readmitted on 7/30 - for shortness of breath, fevers, lethargy but now alos having a new chest wall mass. Her chest wall mass and recurrent pleural effusion were sampled that was proven + adenoca c/w metastatic disease. She also underwent pleurex catheter placement on 8/6 for symptomatic relief. Initially she was placed on 7 days of cefepime for possible pneumonia which did improve her fevers. Her fevers started to recur the day after abtx has ceased. Since all cultures were negative, fevers were attributed to malignancy. Today, she is more lethargic concerning for secondarly infection, thus empiric abtx restarted.  cxr per my read has increased effusion Past Medical History:  Diagnosis Date  . Cancer ([place])    BREAST CANCER RIGHT BREAST  . Chronic cough   . Family history of  breast cancer   . GERD (gastroesophageal reflux disease)   . Hyperlipidemia   . IBS (irritable bowel syndrome)    sees Dr. Silvano Rusk   . Routine gynecological examination    sees Dr. Freda Munro    Allergies:  Allergies  Allergen Reactions  . Barbiturates Other (See Comments)    Keeps pt awake  . Methocarbamol Itching  . Sulfonamide Derivatives Itching     MEDICATIONS: . Chlorhexidine Gluconate Cloth  6 each Topical Daily  . docusate sodium  200 mg Oral BID  . enoxaparin (LOVENOX) injection  40 mg Subcutaneous Daily  . metoprolol tartrate  25 mg Oral BID  . morphine  30 mg Oral Q12H  . polyethylene glycol  17 g Oral BID  . senna-docusate  2 tablet Oral BID    Social History   Tobacco Use  . Smoking status: Never Smoker  . Smokeless tobacco: Never Used  Substance Use Topics  . Alcohol use: No    Alcohol/week: 0.0 standard drinks  . Drug use: No    Family History  Problem Relation Age of Onset  . Hypertension Other   . Breast cancer Other        mat first cousin's daughter  . Breast cancer Maternal Aunt   . Hypertension Mother   . Breast cancer Maternal Aunt   . Breast cancer Maternal Aunt     Review of Systems -  Review of Systems  Constitutional: Negative for fever, chills, diaphoresis, activity change, appetite change, fatigue and unexpected weight change.  HENT:  Negative for congestion, sore throat, rhinorrhea, sneezing, trouble swallowing and sinus pressure.  Eyes: Negative for photophobia and visual disturbance.  Respiratory: Negative for cough, chest tightness, shortness of breath, wheezing and stridor.  Cardiovascular: Negative for chest pain, palpitations and leg swelling.  Gastrointestinal: Negative for nausea, vomiting, abdominal pain, diarrhea, constipation, blood in stool, abdominal distention and anal bleeding.  Genitourinary: Negative for dysuria, hematuria, flank pain and difficulty urinating.  Musculoskeletal: Negative for myalgias, back  pain, joint swelling, arthralgias and gait problem.  Skin: Negative for color change, pallor, rash and wound.  Neurological: Negative for dizziness, tremors, weakness and light-headedness.  Hematological: Negative for adenopathy. Does not bruise/bleed easily.  Psychiatric/Behavioral: Negative for behavioral problems, confusion, sleep disturbance, dysphoric mood, decreased concentration and agitation.      OBJECTIVE: Temp:  [97.4 F (36.3 C)-103 F (39.4 C)] 99.6 F (37.6 C) (08/10 1355) Pulse Rate:  [100-131] 113 (08/10 1355) Resp:  [20-44] 27 (08/10 1355) BP: (122-149)/(79-103) 140/97 (08/10 1355) SpO2:  [92 %-100 %] 100 % (08/10 1355) Physical Exam  Constitutional:  oriented to person, place, and time. appears well-developed and well-nourished. No distress.  HENT: [place]/AT, PERRLA, no scleral icterus Mouth/Throat: Oropharynx is clear and moist. No oropharyngeal exudate.  Cardiovascular: Normal rate, regular rhythm and normal heart sounds. Exam reveals no gallop and no friction rub.  No murmur heard.  Chest wall: portacath on the right Pulmonary/Chest: Effort normal and breath sounds are decreased on the righthas no wheezes.  Neck = supple, no nuchal rigidity Abdominal: Soft. Bowel sounds are normal.  exhibits no distension. There is no tenderness.  Lymphadenopathy: no cervical adenopathy. No axillary adenopathy Neurological: alert and oriented to person, place, and time.  Skin: Skin is warm and dry. No rash noted. No erythema.  Psychiatric: a normal mood and affect.  behavior is normal.    LABS: Results for orders placed or performed during the hospital encounter of 04/23/19 (from the past 48 hour(s))  Procalcitonin     Status: None   Collection Time: 05/04/19  3:46 AM  Result Value Ref Range   Procalcitonin 0.46 ng/mL    Comment:        Interpretation: PCT (Procalcitonin) <= 0.5 ng/mL: Systemic infection (sepsis) is not likely. Local bacterial infection is possible.  (NOTE)       Sepsis PCT Algorithm           Lower Respiratory Tract                                      Infection PCT Algorithm    ----------------------------     ----------------------------         PCT < 0.25 ng/mL                PCT < 0.10 ng/mL         Strongly encourage             Strongly discourage   discontinuation of antibiotics    initiation of antibiotics    ----------------------------     -----------------------------       PCT 0.25 - 0.50 ng/mL            PCT 0.10 - 0.25 ng/mL               OR       >80% decrease in PCT            Discourage  initiation of                                            antibiotics      Encourage discontinuation           of antibiotics    ----------------------------     -----------------------------         PCT >= 0.50 ng/mL              PCT 0.26 - 0.50 ng/mL               AND        <80% decrease in PCT             Encourage initiation of                                             antibiotics       Encourage continuation           of antibiotics    ----------------------------     -----------------------------        PCT >= 0.50 ng/mL                  PCT > 0.50 ng/mL               AND         increase in PCT                  Strongly encourage                                      initiation of antibiotics    Strongly encourage escalation           of antibiotics                                     -----------------------------                                           PCT <= 0.25 ng/mL                                                 OR                                        > 80% decrease in PCT                                     Discontinue / Do not initiate  antibiotics Performed at [facility], [place] [address] [place], [place] [address]   Basic metabolic panel     Status: Abnormal   Collection Time: 05/04/19  3:46 AM  Result Value Ref Range   Sodium 142 135 -  145 mmol/L   Potassium 3.6 3.5 - 5.1 mmol/L   Chloride 112 (H) 98 - 111 mmol/L   CO2 24 22 - 32 mmol/L   Glucose, Bld 117 (H) 70 - 99 mg/dL   BUN 13 6 - 20 mg/dL   Creatinine, Ser 0.50 0.44 - 1.00 mg/dL   Calcium 7.4 (L) 8.9 - 10.3 mg/dL   GFR calc non Af Amer >60 >60 mL/min   GFR calc Af Amer >60 >60 mL/min   Anion gap 6 5 - 15    Comment: Performed at [facility], [place] [address], [place], [place] [address]  CBC with Differential/Platelet     Status: Abnormal   Collection Time: 05/04/19  3:46 AM  Result Value Ref Range   WBC 9.8 4.0 - 10.5 K/uL   RBC 3.37 (L) 3.87 - 5.11 MIL/uL   Hemoglobin 8.5 (L) 12.0 - 15.0 g/dL   HCT 28.6 (L) 36.0 - 46.0 %   MCV 84.9 80.0 - 100.0 fL   MCH 25.2 (L) 26.0 - 34.0 pg   MCHC 29.7 (L) 30.0 - 36.0 g/dL   RDW 18.8 (H) 11.5 - 15.5 %   Platelets 302 150 - 400 K/uL   nRBC 0.0 0.0 - 0.2 %   Neutrophils Relative % 70 %   Neutro Abs 7.0 1.7 - 7.7 K/uL   Lymphocytes Relative 13 %   Lymphs Abs 1.2 0.7 - 4.0 K/uL   Monocytes Relative 12 %   Monocytes Absolute 1.2 (H) 0.1 - 1.0 K/uL   Eosinophils Relative 4 %   Eosinophils Absolute 0.4 0.0 - 0.5 K/uL   Basophils Relative 0 %   Basophils Absolute 0.0 0.0 - 0.1 K/uL   Immature Granulocytes 1 %   Abs Immature Granulocytes 0.05 0.00 - 0.07 K/uL    Comment: Performed at [facility], [place] [address] [place], [place] [address]  Lactic acid, plasma     Status: None   Collection Time: 05/04/19  3:46 AM  Result Value Ref Range   Lactic Acid, Venous 1.7 0.5 - 1.9 mmol/L    Comment: Performed at [facility], [place] [address] [place], [place] [address]  Basic metabolic panel     Status: Abnormal   Collection Time: 05/05/19  4:16 AM  Result Value Ref Range   Sodium 136 135 - 145 mmol/L   Potassium 3.6 3.5 - 5.1 mmol/L   Chloride 105 98 - 111 mmol/L   CO2 24 22 - 32 mmol/L   Glucose, Bld 112 (H) 70 - 99 mg/dL   BUN 6 6 - 20 mg/dL   Creatinine, Ser 0.45  0.44 - 1.00 mg/dL   Calcium 6.9 (L) 8.9 - 10.3 mg/dL   GFR calc non Af Amer >60 >60 mL/min   GFR calc Af Amer >60 >60 mL/min   Anion gap 7 5 - 15    Comment: Performed at [facility], [place] [address] [place], [place] [address]  CBC with Differential/Platelet     Status: Abnormal   Collection Time: 05/05/19  4:16 AM  Result Value Ref Range   WBC 8.4 4.0 - 10.5 K/uL   RBC 3.54 (L) 3.87 - 5.11 MIL/uL   Hemoglobin  8.9 (L) 12.0 - 15.0 g/dL   HCT 30.0 (L) 36.0 - 46.0 %   MCV 84.7 80.0 - 100.0 fL   MCH 25.1 (L) 26.0 - 34.0 pg   MCHC 29.7 (L) 30.0 - 36.0 g/dL   RDW 18.9 (H) 11.5 - 15.5 %   Platelets 366 150 - 400 K/uL   nRBC 0.0 0.0 - 0.2 %   Neutrophils Relative % 74 %   Neutro Abs 6.2 1.7 - 7.7 K/uL   Lymphocytes Relative 15 %   Lymphs Abs 1.2 0.7 - 4.0 K/uL   Monocytes Relative 8 %   Monocytes Absolute 0.7 0.1 - 1.0 K/uL   Eosinophils Relative 2 %   Eosinophils Absolute 0.2 0.0 - 0.5 K/uL   Basophils Relative 0 %   Basophils Absolute 0.0 0.0 - 0.1 K/uL   Immature Granulocytes 1 %   Abs Immature Granulocytes 0.05 0.00 - 0.07 K/uL    Comment: Performed at [facility], [place] [address] [place], [place] [address]  Phosphorus     Status: Abnormal   Collection Time: 05/05/19  4:16 AM  Result Value Ref Range   Phosphorus 1.3 (L) 2.5 - 4.6 mg/dL    Comment: Performed at [facility], [place] [address], [place], [place] [address]  Magnesium     Status: None   Collection Time: 05/05/19  4:16 AM  Result Value Ref Range   Magnesium 1.7 1.7 - 2.4 mg/dL    Comment: Performed at [facility], [place] [address] [place], [place] [address]  Culture, blood (Routine X 2) w Reflex to ID Panel     Status: None (Preliminary result)   Collection Time: 05/05/19 10:03 AM   Specimen: BLOOD  Result Value Ref Range   Specimen Description      BLOOD LEFT ANTECUBITAL Performed at [facility], [place] [address], [place],  [place] [address]    Special Requests      BOTTLES DRAWN AEROBIC AND ANAEROBIC Blood Culture adequate volume Performed at [place] [address] [place], [place] [address]    Culture PENDING    Report Status PENDING     MICRO: reviewed IMAGING: Dg Chest Port 1 View  Result Date: 05/05/2019 CLINICAL DATA:  Sepsis. EXAM: PORTABLE CHEST 1 VIEW COMPARISON:  Radiographs of May 02, 2019. FINDINGS: Stable cardiomediastinal silhouette. Right-sided chest tube is unchanged in position. Right internal jugular Port-A-Cath is unchanged. Large right pleural effusion is noted which is significantly increased in size compared to prior exam. Underlying atelectasis or infiltrate cannot be excluded. Left lung is clear. Bony thorax is unremarkable. No pneumothorax is noted. IMPRESSION: Large right pleural effusion is noted which is enlarged compared to prior exam. Stable position of right-sided chest tube. Electronically Signed   By: Marijo Conception M.D.   On: 05/05/2019 09:17    Assessment/Plan:  48yo F immunocompromised host with metastatic breast ca admitted for recurrent pleural effusion, chest wall lesion, and fevers related to her malignancy despite recently finishing neoadjuvant cehom from march to may 2020 and right mastectomy in June. I suspect her fevers are due to her malignancy, though it is prudent to keep empiric abtx on for the next 48hrs to see if any new culture data reveals any new infection.   Pleural effusion = malignant effusion based on path results on admit, recommend resending cell count and fluid for culture  Fever = suspect it is related to chemo. Awaiting repeat blood cx  results taken off of abtx. Consider checking cmv viral load  Metastatic breast ca= planned for starting chemotherapy per oncology during hospitalization

## 2019-05-06 DIAGNOSIS — J9601 Acute respiratory failure with hypoxia: Secondary | ICD-10-CM

## 2019-05-06 DIAGNOSIS — A689 Relapsing fever, unspecified: Secondary | ICD-10-CM

## 2019-05-06 LAB — CBC WITH DIFFERENTIAL/PLATELET
Abs Immature Granulocytes: 0.03 10*3/uL (ref 0.00–0.07)
Basophils Absolute: 0 10*3/uL (ref 0.0–0.1)
Basophils Relative: 0 %
Eosinophils Absolute: 0.4 10*3/uL (ref 0.0–0.5)
Eosinophils Relative: 6 %
HCT: 28.7 % — ABNORMAL LOW (ref 36.0–46.0)
Hemoglobin: 8.6 g/dL — ABNORMAL LOW (ref 12.0–15.0)
Immature Granulocytes: 0 %
Lymphocytes Relative: 14 %
Lymphs Abs: 0.9 10*3/uL (ref 0.7–4.0)
MCH: 25.2 pg — ABNORMAL LOW (ref 26.0–34.0)
MCHC: 30 g/dL (ref 30.0–36.0)
MCV: 84.2 fL (ref 80.0–100.0)
Monocytes Absolute: 0.8 10*3/uL (ref 0.1–1.0)
Monocytes Relative: 12 %
Neutro Abs: 4.5 10*3/uL (ref 1.7–7.7)
Neutrophils Relative %: 68 %
Platelets: 333 10*3/uL (ref 150–400)
RBC: 3.41 MIL/uL — ABNORMAL LOW (ref 3.87–5.11)
RDW: 19.1 % — ABNORMAL HIGH (ref 11.5–15.5)
WBC: 6.7 10*3/uL (ref 4.0–10.5)
nRBC: 0 % (ref 0.0–0.2)

## 2019-05-06 LAB — BODY FLUID CELL COUNT WITH DIFFERENTIAL
Lymphs, Fluid: 22 %
Monocyte-Macrophage-Serous Fluid: 31 % — ABNORMAL LOW (ref 50–90)
Neutrophil Count, Fluid: 47 % — ABNORMAL HIGH (ref 0–25)
Total Nucleated Cell Count, Fluid: 2111 cu mm — ABNORMAL HIGH (ref 0–1000)

## 2019-05-06 LAB — HEPATIC FUNCTION PANEL
ALT: 18 U/L (ref 0–44)
AST: 13 U/L — ABNORMAL LOW (ref 15–41)
Albumin: 2.3 g/dL — ABNORMAL LOW (ref 3.5–5.0)
Alkaline Phosphatase: 118 U/L (ref 38–126)
Bilirubin, Direct: 0.1 mg/dL (ref 0.0–0.2)
Indirect Bilirubin: 0.3 mg/dL (ref 0.3–0.9)
Total Bilirubin: 0.4 mg/dL (ref 0.3–1.2)
Total Protein: 6.1 g/dL — ABNORMAL LOW (ref 6.5–8.1)

## 2019-05-06 LAB — PHOSPHORUS: Phosphorus: 1.2 mg/dL — ABNORMAL LOW (ref 2.5–4.6)

## 2019-05-06 LAB — BASIC METABOLIC PANEL
Anion gap: 11 (ref 5–15)
BUN: 6 mg/dL (ref 6–20)
CO2: 24 mmol/L (ref 22–32)
Calcium: 6.7 mg/dL — ABNORMAL LOW (ref 8.9–10.3)
Chloride: 105 mmol/L (ref 98–111)
Creatinine, Ser: 0.56 mg/dL (ref 0.44–1.00)
GFR calc Af Amer: >60 mL/min (ref >60)
GFR calc non Af Amer: >60 mL/min (ref >60)
Glucose, Bld: 112 mg/dL — ABNORMAL HIGH (ref 70–99)
Potassium: 3.5 mmol/L (ref 3.5–5.1)
Sodium: 140 mmol/L (ref 135–145)

## 2019-05-06 LAB — LACTIC ACID, PLASMA: Lactic Acid, Venous: 1.9 mmol/L (ref 0.5–1.9)

## 2019-05-06 NOTE — Progress Notes (Signed)
PROGRESS NOTE  Carmilla Granville IFO:[id number] DOB: 20-Mar-1971 DOA: 04/23/2019 PCP: [name], MD  HPI/Recap of past 24 hours: Brittany Rodgers is a 48 y.o. 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    She had been seen on the day prior in the oncology clinic and was noted to have a new chest wall mass as well as liver mass. Pt was being considered for possible biopsy as an outpatient.  Next day, patient became increasingly [place] of breath and tachycardic.  She was directed to ED for inpatient admission and management.  In the ED, patient was hypoxic and tachycardic up to 140s with a CT scan of chest on admission showing findings worrisome for large right sided effusion.  Patient was admitted under hospitalist service for further evaluation and management.  Oncology and IR consult was obtained.  On 7/30, patient underwent thoracentesis and biopsy of chest wall mass.  Fluid cytology and biopsy of the mass positive for metastatic disease.  On 8/6, patient underwent Pleurx catheter placement on right side.  Initially scheduled then for discharge, but this was canceled due to intermittent high fever and continued shortness of breath.  Seen by infectious disease who agreed with covering her with broad-spectrum antibiotics, but suspected work-up is negative and fever could be attributed to her chest mass/cancer.  Today, patient doing okay, still somewhat Nosal of breath although improved.  Still with some constipation.  Assessment/Plan: Principal Problem:   Pleural effusion Active Problems:   IBS   Hyperlipidemia   Chronic right hip pain   Malignant neoplasm of lower-outer quadrant of right breast of female, estrogen receptor negative (HCC)   S/P mastectomy   Pleural effusion on right SIRS with acute metabolic encephalopathy  -Patient on  admission was started on broad-spectrum antibiotics.  No source identified.  Antibiotics stopped on 8/6.  Continue to spike intermittent high fever.  However, WBC, lactic acid level normal, procalcitonin level continue to trend down.  On 8/8,, Iintermittent fever spike was related to cancer.  However, patient is getting tachycardic and more drowsy in last 24 hours.  T-max 103 in last 24 hours.  Infection work-up negative.  Seen by ID who suspected that cancer could be causing fever.  Recommended continuing antibiotics until 8/12  Metastatic breast cancer (invasive ductal carcinoma), status post right mastectomy  Recent diagnosis of metastasis to chest wall and right pleural effusion -Followed by [name], oncology as outpatient. -Pleurx catheter was placed on 8/6 but apparently there was no order to drain it.  So patient did not get it drained for last 4 days.  Large right pleural effusion noted on repeat chest x-ray today. Has been ordered to drain Pleurx catheter every other day. -Noted a plan from oncology to start additional chemotherapy: Carboplatin and gemcitabine, possibly next week  Right axillary pain -At the site of metastasis to anterior right chest wall and pectoralis muscle.  Patient is on MSIR and MS contin for chronic pain.PRN IV morphine.  Sinus tachycardia -mostly over 120s in last 24 hours.  Likely related to large right pleural effusion.  Monitor.  Constipation -Stool softeners ordered.  Also on MiraLAX twice daily.  Hyperlipidemia-on statin.  At risk of liver injury due to metastasis and chemotherapy.  No real benefit of statin because of coexisting metastatic cancer.  Statin discontinued  Hypercalcemia:Likely secondary to malignancy.As perDr. Jana Hakim  hemato-oncology patient receivedpamidronate 90 mg x 1 with improvement in calcium levels. Calcium level decreasing now, 6.9 today.    Follow calcium and albuminDebility, weakness. Seen by physical therapy,  occupational therapy. Recommend home health/outpatient PT on discharge.  Code Status: Full code  Family Communication: Left message for family  Disposition Plan: Home in a few days   Consultants:  Oncology  Interventional radiology  Infectious disease  Procedures: 7/30 - thoracentesis and biopsy of chest wall mass.   8/6 - Pleurx catheter placement on right side.    Antimicrobials:  IV Zosyn and vancomycin 8/10-8/12  DVT prophylaxis: Lovenox   Objective: Vitals:   05/05/19 2215 05/06/19 0534  BP:  120/89  Pulse: (!) 114 (!) 117  Resp: 20 17  Temp:  (!) 97.5 F (36.4 C)  SpO2:  100%    Intake/Output Summary (Last 24 hours) at 05/06/2019 1405 Last data filed at 05/06/2019 0914 [facility] per 24 hour  Intake 3840.02 ml  Output 2200 ml  Net 1640.02 ml   Filed Weights   04/24/19 0003  Weight: 78.5 kg   Body mass index is 31.64 kg/m.  Exam:   General: Alert and oriented x3, fatigued  HEENT: Normocephalic and atraumatic, mucous membrane slightly dry  Neck: Supple, no JVD  Cardiovascular: Regular rhythm, tachycardic  Respiratory: Decreased breath sounds bibasilar, noted Pleurx catheter, labored breathing  Abdomen: Soft, nontender, nondistended, positive bowel sounds  Musculoskeletal: No clubbing or cyanosis, trace pitting edema  Skin: No skin breaks, tears or lesions  Psychiatry: Appropriate, no evidence of psychoses  Neuro: No focal deficits   Data Reviewed: CBC: Recent Labs  Lab 05/01/19 0402 05/02/19 0345 05/03/19 0349 05/04/19 0346 05/05/19 0416 05/06/19 0426  WBC 8.3 7.3 10.0 9.8 8.4 6.7  NEUTROABS 6.1  --   --  7.0 6.2 4.5  HGB 9.8* 10.0* 9.1* 8.5* 8.9* 8.6*  HCT 33.4* 34.6* 31.4* 28.6* 30.0* 28.7*  MCV 86.8 86.7 86.0 84.9 84.7 84.2  PLT 355 371 334 302 366 314   Basic Metabolic Panel: Recent Labs  Lab 04/30/19 0352  05/02/19 0345 05/03/19 0349 05/04/19 0346 05/05/19 0416 05/06/19 0426  NA 143   < > 140 138 142 136 140   K 3.9   < > 3.6 3.3* 3.6 3.6 3.5  CL 109   < > 106 107 112* 105 105  CO2 26   < > 23 23 24 24 24   GLUCOSE 105*   < > 113* 113* 117* 112* 112*  BUN 7   < > 8 14 13 6 6   CREATININE 0.62   < > 0.73 0.92 0.50 0.45 0.56  CALCIUM 11.2*   < > 9.5 8.1* 7.4* 6.9* 6.7*  MG 1.9  --  1.6* 1.7  --  1.7  --   PHOS  --   --   --   --   --  1.3* 1.2*   < > = values in this interval not displayed.   GFR: Estimated Creatinine Clearance: 82.6 mL/min (by C-G formula based on SCr of 0.56 mg/dL). Liver Function Tests: Recent Labs  Lab 04/30/19 0352 05/01/19 0402 05/03/19 0349 05/06/19 0426  AST 10* 11* 17 13*  ALT 19 18 17 18   ALKPHOS 105 107 78 118  BILITOT 0.4 0.4 0.4 0.4  PROT 7.2 6.9 5.4* 6.1*  ALBUMIN 3.2* 3.3* 2.4* 2.3*   No results for input(s): LIPASE, AMYLASE in the last 168 hours. No results for input(s): AMMONIA in the last 168 hours. Coagulation  Profile: Recent Labs  Lab 05/01/19 0402  INR 1.1   Cardiac Enzymes: No results for input(s): CKTOTAL, CKMB, CKMBINDEX, TROPONINI in the last 168 hours. BNP (last 3 results) No results for input(s): PROBNP in the last 8760 hours. HbA1C: No results for input(s): HGBA1C in the last 72 hours. CBG: No results for input(s): GLUCAP in the last 168 hours. Lipid Profile: No results for input(s): CHOL, HDL, LDLCALC, TRIG, CHOLHDL, LDLDIRECT in the last 72 hours. Thyroid Function Tests: No results for input(s): TSH, T4TOTAL, FREET4, T3FREE, THYROIDAB in the last 72 hours. Anemia Panel: No results for input(s): VITAMINB12, FOLATE, FERRITIN, TIBC, IRON, RETICCTPCT in the last 72 hours. Urine analysis:    Component Value Date/Time   COLORURINE YELLOW 04/24/2019 1700   APPEARANCEUR HAZY (A) 04/24/2019 1700   LABSPEC 1.025 04/24/2019 1700   PHURINE 5.0 04/24/2019 1700   GLUCOSEU NEGATIVE 04/24/2019 1700   HGBUR NEGATIVE 04/24/2019 1700   BILIRUBINUR NEGATIVE 04/24/2019 1700   BILIRUBINUR negative 05/31/2018 1011   KETONESUR NEGATIVE  04/24/2019 1700   PROTEINUR NEGATIVE 04/24/2019 1700   UROBILINOGEN 0.2 05/31/2018 1011   UROBILINOGEN 0.2 05/18/2010 1042   NITRITE NEGATIVE 04/24/2019 1700   LEUKOCYTESUR LARGE (A) 04/24/2019 1700   Sepsis Labs: @LABRCNTIP (procalcitonin:4,lacticidven:4)  ) Recent Results (from the past 240 hour(s))  Novel Coronavirus, NAA (hospital order; send-out to ref lab)     Status: None   Collection Time: 04/29/19 12:02 PM   Specimen: Nasopharyngeal Swab; Respiratory  Result Value Ref Range Status   SARS-CoV-2, NAA NOT DETECTED NOT DETECTED Final    Comment: (NOTE) This test was developed and its performance characteristics determined by [employer]. This test has not been FDA cleared or approved. This test has been authorized by FDA under an Emergency Use Authorization (EUA). This test is only authorized for the duration of time the declaration that circumstances exist justifying the authorization of the emergency use of in vitro diagnostic tests for detection of SARS-CoV-2 virus and/or diagnosis of COVID-19 infection under section 564(b)(1) of the Act, 21 U.S.C. 382NKN-3(Z)(7), unless the authorization is terminated or revoked sooner. When diagnostic testing is negative, the possibility of a false negative result should be considered in the context of a patient's recent exposures and the presence of clinical signs and symptoms consistent with COVID-19. An individual without symptoms of COVID-19 and who is not shedding SARS-CoV-2 virus would expect to have a negative (not detected) result in this assay. Performed  At: [facility] [place], [place] [id number] Rush Farmer MD KW:[id number]    [place]  Final    Comment: Performed at [facility] [address], [place], [place] [address]  Culture, blood (routine x 2)     Status: None (Preliminary result)   Collection Time: 05/02/19  7:44 AM   Specimen:  BLOOD  Result Value Ref Range Status   Specimen Description   Final    BLOOD LEFT ARM Performed at [facility] [address], [place], [place] [address]    Special Requests   Final    BOTTLES DRAWN AEROBIC AND ANAEROBIC Blood Culture adequate volume Performed at [facility] [address], [place], [place] [address]    Culture   Final    NO GROWTH 4 DAYS Performed at [facility], [place] [address], [place], [place] [address]    Report Status PENDING  Incomplete  Culture, blood (routine x 2)     Status: None (Preliminary  result)   Collection Time: 05/02/19  7:44 AM   Specimen: BLOOD LEFT HAND  Result Value Ref Range Status   Specimen Description   Final    BLOOD LEFT HAND Performed at [facility] [address], [place], [place] [address]    Special Requests   Final    BOTTLES DRAWN AEROBIC AND ANAEROBIC Blood Culture adequate volume Performed at [facility] [address] [place], [place] [address]    Culture   Final    NO GROWTH 4 DAYS Performed at [facility], [place] [address], [place], [place] [address]    Report Status PENDING  Incomplete  Culture, blood (Routine X 2) w Reflex to ID Panel     Status: None (Preliminary result)   Collection Time: 05/05/19 10:03 AM   Specimen: BLOOD  Result Value Ref Range Status   Specimen Description   Final    BLOOD LEFT ANTECUBITAL Performed at [facility], [place] [address] [place], [place] [address]    Special Requests   Final    BOTTLES DRAWN AEROBIC AND ANAEROBIC Blood Culture adequate volume Performed at [facility] [address] [place], [place] [address]    Culture   Final    NO GROWTH 1 DAY Performed at [facility], [place] [address], [place], [place] [address]    Report Status PENDING  Incomplete  Culture, blood (Routine X 2) w Reflex to ID Panel     Status: None (Preliminary result)    Collection Time: 05/05/19 10:12 AM   Specimen: BLOOD LEFT HAND  Result Value Ref Range Status   Specimen Description   Final    BLOOD LEFT HAND Performed at [facility] [address] [place], [place] [address]    Special Requests   Final    BOTTLES DRAWN AEROBIC ONLY Blood Culture adequate volume Performed at [facility] [address] [place], [place] [address]    Culture   Final    NO GROWTH 1 DAY Performed at [facility], [place] [address] [place],  [address]    Report Status PENDING  Incomplete      Studies: No results found.  Scheduled Meds: . docusate sodium  200 mg Oral BID  . enoxaparin (LOVENOX) injection  40 mg Subcutaneous Daily  . metoprolol tartrate  25 mg Oral BID  . morphine  30 mg Oral Q12H  . polyethylene glycol  17 g Oral BID  . senna-docusate  2 tablet Oral BID    Continuous Infusions: . sodium chloride 10 mL/hr at 05/02/19 1656  . sodium chloride 75 mL/hr at 05/06/19 0400  . piperacillin-tazobactam (ZOSYN)  IV 3.375 g (05/06/19 0950)  . vancomycin 750 mg (05/06/19 0951)     LOS: 13 days     Annita Brod, MD [facility]  To reach me or the doctor on call, go to: [email or web address] Password [facility]  05/06/2019, 2:05 PM

## 2019-05-06 NOTE — Progress Notes (Addendum)
Bosie Helper [place]   DOB:1971-08-04   VE#:[id number]   WCH#:[id number]  Subjective:  Brittany Rodgers has some improvement in her shortness of breath following drainage of Pleurx catheter yesterday.  She does still report that she gets [place] of breath when she takes her oxygen off.  T-max 100.1 in the past 24 hours.  She denies cough and hemoptysis.  Pain is overall controlled.  Bowels are now moving.  Objective: young [place] American woman who appears comfortable in bed. Vitals:   05/05/19 2215 05/06/19 0534  BP:  120/89  Pulse: (!) 114 (!) 117  Resp: 20 17  Temp:  (!) 97.5 F (36.4 C)  SpO2:  100%    Body mass index is 31.64 kg/m.  Intake/Output Summary (Last 24 hours) at 05/06/2019 1222 Last data filed at 05/06/2019 0914 [place] per 24 hour  Intake 3840.02 ml  Output 2200 ml  Net 1640.02 ml   General: Awake and alert, comfortable.  Appears [place] of breath at times Skin: No rashes HEENT: No thrush or mucositis Lungs: Diminished breath sounds  Cardiovascular: Tachycardic, no murmur.  No lower extremity edema. Abdomen: Positive bowel sounds, soft, nontender Neuro: Alert and oriented x3   CBG (last 3)  No results for input(s): GLUCAP in the last 72 hours.   Labs:  Lab Results  Component Value Date   WBC 6.7 05/06/2019   HGB 8.6 (L) 05/06/2019   HCT 28.7 (L) 05/06/2019   MCV 84.2 05/06/2019   PLT 333 05/06/2019   NEUTROABS 4.5 05/06/2019    @LASTCHEMISTRY @  Urine Studies No results for input(s): UHGB, CRYS in the last 72 hours.  Invalid input(s): UACOL, UAPR, USPG, UPH, UTP, UGL, [place], UBIL, UNIT, UROB, [place], UEPI, UWBC, Junie Panning [place], [place], [place]  Basic Metabolic Panel: Recent Labs  Lab 04/30/19 0352  05/02/19 0345 05/03/19 0349 05/04/19 0346 05/05/19 0416 05/06/19 0426  NA 143   < > 140 138 142 136 140  K 3.9   < > 3.6 3.3* 3.6 3.6 3.5  CL 109   < > 106 107 112* 105 105  CO2 26   < > 23 23 24 24 24   GLUCOSE 105*   < > 113* 113* 117* 112* 112*  BUN 7   < > 8 14  13 6 6   CREATININE 0.62   < > 0.73 0.92 0.50 0.45 0.56  CALCIUM 11.2*   < > 9.5 8.1* 7.4* 6.9* 6.7*  MG 1.9  --  1.6* 1.7  --  1.7  --   PHOS  --   --   --   --   --  1.3* 1.2*   < > = values in this interval not displayed.   GFR Estimated Creatinine Clearance: 82.6 mL/min (by C-G formula based on SCr of 0.56 mg/dL). Liver Function Tests: Recent Labs  Lab 04/30/19 0352 05/01/19 0402 05/03/19 0349 05/06/19 0426  AST 10* 11* 17 13*  ALT 19 18 17 18   ALKPHOS 105 107 78 118  BILITOT 0.4 0.4 0.4 0.4  PROT 7.2 6.9 5.4* 6.1*  ALBUMIN 3.2* 3.3* 2.4* 2.3*   No results for input(s): LIPASE, AMYLASE in the last 168 hours. No results for input(s): AMMONIA in the last 168 hours. Coagulation profile Recent Labs  Lab 05/01/19 0402  INR 1.1    CBC: Recent Labs  Lab 05/01/19 0402 05/02/19 0345 05/03/19 0349 05/04/19 0346 05/05/19 0416 05/06/19 0426  WBC 8.3 7.3 10.0 9.8 8.4 6.7  NEUTROABS 6.1  --   --  7.0 6.2 4.5  HGB 9.8* 10.0* 9.1* 8.5* 8.9* 8.6*  HCT 33.4* 34.6* 31.4* 28.6* 30.0* 28.7*  MCV 86.8 86.7 86.0 84.9 84.7 84.2  PLT 355 371 334 302 366 333   Cardiac Enzymes: No results for input(s): CKTOTAL, CKMB, CKMBINDEX, TROPONINI in the last 168 hours. BNP: Invalid input(s): POCBNP CBG: No results for input(s): GLUCAP in the last 168 hours. D-Dimer No results for input(s): DDIMER in the last 72 hours. Hgb A1c No results for input(s): HGBA1C in the last 72 hours. Lipid Profile No results for input(s): CHOL, HDL, LDLCALC, TRIG, CHOLHDL, LDLDIRECT in the last 72 hours. Thyroid function studies No results for input(s): TSH, T4TOTAL, T3FREE, THYROIDAB in the last 72 hours.  Invalid input(s): FREET3 Anemia work up No results for input(s): VITAMINB12, FOLATE, FERRITIN, TIBC, IRON, RETICCTPCT in the last 72 hours. Microbiology Recent Results (from the past 240 hour(s))  Novel Coronavirus, NAA (hospital order; send-out to ref lab)     Status: None   Collection Time:  04/29/19 12:02 PM   Specimen: Nasopharyngeal Swab; Respiratory  Result Value Ref Range Status   SARS-CoV-2, NAA NOT DETECTED NOT DETECTED Final    Comment: (NOTE) This test was developed and its performance characteristics determined by [employer]. This test has not been FDA cleared or approved. This test has been authorized by FDA under an Emergency Use Authorization (EUA). This test is only authorized for the duration of time the declaration that circumstances exist justifying the authorization of the emergency use of in vitro diagnostic tests for detection of SARS-CoV-2 virus and/or diagnosis of COVID-19 infection under section 564(b)(1) of the Act, 21 U.S.C. 419QQI-2(L)(7), unless the authorization is terminated or revoked sooner. When diagnostic testing is negative, the possibility of a false negative result should be considered in the context of a patient's recent exposures and the presence of clinical signs and symptoms consistent with COVID-19. An individual without symptoms of COVID-19 and who is not shedding SARS-CoV-2 virus would expect to have a negative (not detected) result in this assay. Performed  At: [facility] [place], [place] [id number] Rush Farmer MD DE:[id number]    [place]  Final    Comment: Performed at [facility] [address], [place], [place] [address]  Culture, blood (routine x 2)     Status: None (Preliminary result)   Collection Time: 05/02/19  7:44 AM   Specimen: BLOOD  Result Value Ref Range Status   Specimen Description   Final    BLOOD LEFT ARM Performed at [facility] [address] [place], [place] [address]    Special Requests   Final    BOTTLES DRAWN AEROBIC AND ANAEROBIC Blood Culture adequate volume Performed at [facility] [address], [place], [place] [address]    Culture   Final    NO GROWTH 3  DAYS Performed at [facility], [place] [address] [place], [place] [address]    Report Status PENDING  Incomplete  Culture, blood (routine x 2)     Status: None (Preliminary result)   Collection Time: 05/02/19  7:44 AM   Specimen: BLOOD LEFT HAND  Result Value Ref Range Status   Specimen Description   Final    BLOOD LEFT HAND Performed at [facility] [address] [place], [place] [address]    Special Requests   Final    BOTTLES DRAWN AEROBIC AND ANAEROBIC Blood Culture adequate volume Performed at  [facility], [place] [address], [place], [place] [address]    Culture   Final    NO GROWTH 3 DAYS Performed at [facility], [place] [address] [place], [place] [address]    Report Status PENDING  Incomplete  Culture, blood (Routine X 2) w Reflex to ID Panel     Status: None (Preliminary result)   Collection Time: 05/05/19 10:03 AM   Specimen: BLOOD  Result Value Ref Range Status   Specimen Description   Final    BLOOD LEFT ANTECUBITAL Performed at [facility], [place] [address] [place], [place] [address]    Special Requests   Final    BOTTLES DRAWN AEROBIC AND ANAEROBIC Blood Culture adequate volume Performed at [facility] [address], [place], [place] [address]    Culture PENDING  Incomplete   Report Status PENDING  Incomplete      Studies:  Dg Chest Port 1 View  Result Date: 05/05/2019 CLINICAL DATA:  Sepsis. EXAM: PORTABLE CHEST 1 VIEW COMPARISON:  Radiographs of May 02, 2019. FINDINGS: Stable cardiomediastinal silhouette. Right-sided chest tube is unchanged in position. Right internal jugular Port-A-Cath is unchanged. Large right pleural effusion is noted which is significantly increased in size compared to prior exam. Underlying atelectasis or infiltrate cannot be excluded. Left lung is clear. Bony thorax is unremarkable. No pneumothorax is noted. IMPRESSION: Large right pleural effusion is noted  which is enlarged compared to prior exam. Stable position of right-sided chest tube. Electronically Signed   By: Marijo Conception M.D.   On: 05/05/2019 09:17    Assessment: 47 y.o. Brittany Rodgers, Brittany Rodgers woman status post right breast lower outer quadrant biopsy 10/29/2018 for a clinical T2 N0, stage IIB invasive ductal carcinoma, grade 3, triple negative, with an MIB-1 of 80%             (a) CT of the chest 11/15/2018 shows possible liver metastases, but liver biopsy 11/25/2018 consistent with hemangioma             (b) bone scan 11/15/2018 shows no suspicious bone lesions  (1) neoadjuvant chemotherapy consisting of doxorubicin and cyclophosphamide in dose dense fashion x4, started 11/26/2018, completed 01/07/2019, followed by weekly paclitaxel and carboplatin x12 starting 01/28/2019  (2) patient desires ovarian function preservation:              (a) Zoladex started 11/08/2018, discontinued 02/04/2019             (b) patient is status post bilateral tubal ligation  (3) status post right mastectomy with sentinel lymph node sampling 03/04/2019 for a ypTac ypN1a invasive ductal carcinoma grade 3, repeat prognostic panel again triple negative             (a) a total of 6 axillary lymph nodes removed, 2 sentinel ones, 1 positive  (4) adjuvant radiation to be considered  (5) genetics counseling on 11/28/2018, testing was recommended, but declined by patient.  METASTATIC DISEASE: July 2020 (6) Staging studies:             (a) chest CT scan and bone scan 11/15/2018 show a liver mass, no lung or bone lesions             (b) liver biopsy 11/25/2018 shows a hemangioma             (c) chest CT Angio 03/26/2019 again shows liver mass, right breast "seroma"  (d) cytology from R thoracentesis 04/12/2019 nondiagnostic  (e) CT angio  04/23/2019 shows recurrent right effusion, multiple bilateral pulmonary nodules, invasive anterior right chest wall lesion  (7) pembrolizumab started 04/02/2019, to be  repeated every 21 days  (8) CT angio 04/23/2019 shows new lung nodules, likely metastatic  (9) repeat thoracentesis 04/24/2019-- cytology positive  (10) biopsy R chest wall mass 04/24/2019 confirms recurrent/metastatic breast cancer  (a) CARIS study results pending  (11) to start carboplatin and gemcitabine when stable, to be repeated days 1 and 8 of each 21-day cycle    Plan:  Brittany Rodgers's dyspnea is improving.  Pleurx catheter was drained yesterday.  Still having intermittent low-grade fevers.  Unclear if due to infection versus tumor fever.  Appreciate infectious disease consult.  They have recommended to send pleural fluid for cell count and culture.  I have placed an order to drain her Pleurx catheter again today and to send the samples off.  Repeat blood cultures are still pending.  Constipation has improved with bowel regimen.  Continue current bowel regimen.  Pain is currently controlled.  Continue current pain medications.  We will plan to start the patient on carboplatin and gemcitabine.  This will be given on day 1 and day 8 of each 21-day cycle.  She will also continue on pembrolizumab.  This will likely be delayed until next week given her ongoing fevers.   Mikey Bussing, NP 05/06/2019  12:22 PM Medical Oncology and Hematology [facility] [address] [place],  [address] Tel. [phone]    Fax. [phone]

## 2019-05-06 NOTE — Progress Notes (Signed)
Pt tolerated plurex drain well. Drained about 457mls of bloodly colored fluid. Sent fluid to lab per MD orders.

## 2019-05-07 ENCOUNTER — Other Ambulatory Visit: Payer: Self-pay | Admitting: Oncology

## 2019-05-07 ENCOUNTER — Inpatient Hospital Stay: Payer: BC Managed Care – PPO

## 2019-05-07 ENCOUNTER — Inpatient Hospital Stay: Payer: BC Managed Care – PPO | Admitting: Adult Health

## 2019-05-07 ENCOUNTER — Ambulatory Visit: Payer: BC Managed Care – PPO | Admitting: Cardiovascular Disease

## 2019-05-07 DIAGNOSIS — C50511 Malignant neoplasm of lower-outer quadrant of right female breast: Secondary | ICD-10-CM

## 2019-05-07 LAB — CBC WITH DIFFERENTIAL/PLATELET
Abs Immature Granulocytes: 0.03 10*3/uL (ref 0.00–0.07)
Basophils Absolute: 0 10*3/uL (ref 0.0–0.1)
Basophils Relative: 0 %
Eosinophils Absolute: 0 10*3/uL (ref 0.0–0.5)
Eosinophils Relative: 1 %
HCT: 30.7 % — ABNORMAL LOW (ref 36.0–46.0)
Hemoglobin: 8.9 g/dL — ABNORMAL LOW (ref 12.0–15.0)
Immature Granulocytes: 0 %
Lymphocytes Relative: 18 %
Lymphs Abs: 1.4 10*3/uL (ref 0.7–4.0)
MCH: 24.2 pg — ABNORMAL LOW (ref 26.0–34.0)
MCHC: 29 g/dL — ABNORMAL LOW (ref 30.0–36.0)
MCV: 83.4 fL (ref 80.0–100.0)
Monocytes Absolute: 1 10*3/uL (ref 0.1–1.0)
Monocytes Relative: 14 %
Neutro Abs: 5.2 10*3/uL (ref 1.7–7.7)
Neutrophils Relative %: 67 %
Platelets: 388 10*3/uL (ref 150–400)
RBC: 3.68 MIL/uL — ABNORMAL LOW (ref 3.87–5.11)
RDW: 19.3 % — ABNORMAL HIGH (ref 11.5–15.5)
WBC: 7.7 10*3/uL (ref 4.0–10.5)
nRBC: 0 % (ref 0.0–0.2)

## 2019-05-07 LAB — BASIC METABOLIC PANEL
Anion gap: 10 (ref 5–15)
BUN: <5 mg/dL — ABNORMAL LOW (ref 6–20)
CO2: 25 mmol/L (ref 22–32)
Calcium: 6.7 mg/dL — ABNORMAL LOW (ref 8.9–10.3)
Chloride: 105 mmol/L (ref 98–111)
Creatinine, Ser: 0.54 mg/dL (ref 0.44–1.00)
GFR calc Af Amer: >60 mL/min (ref >60)
GFR calc non Af Amer: >60 mL/min (ref >60)
Glucose, Bld: 119 mg/dL — ABNORMAL HIGH (ref 70–99)
Potassium: 3.1 mmol/L — ABNORMAL LOW (ref 3.5–5.1)
Sodium: 140 mmol/L (ref 135–145)

## 2019-05-07 LAB — CULTURE, BLOOD (ROUTINE X 2)
Culture: NO GROWTH
Culture: NO GROWTH
Special Requests: ADEQUATE
Special Requests: ADEQUATE

## 2019-05-07 MED ORDER — PROCHLORPERAZINE MALEATE 10 MG PO TABS: 10.0000 mg | ORAL_TABLET | Freq: Four times a day (QID) | ORAL | 1 refills | Status: DC | PRN

## 2019-05-07 MED ORDER — METOPROLOL TARTRATE 50 MG PO TABS
50.0000 mg | ORAL_TABLET | Freq: Two times a day (BID) | ORAL | Status: DC
  Administered 2019-05-07 – 2019-05-12 (×10): 50 mg via ORAL
  Filled 2019-05-07 (×11): qty 1

## 2019-05-07 MED ORDER — DEXAMETHASONE 4 MG PO TABS: 8.0000 mg | ORAL_TABLET | Freq: Every day | ORAL | 1 refills | Status: DC

## 2019-05-07 MED ORDER — LIP MEDEX EX OINT
TOPICAL_OINTMENT | CUTANEOUS | Status: DC | PRN
  Filled 2019-05-07: qty 7

## 2019-05-07 MED ORDER — METOPROLOL TARTRATE 5 MG/5ML IV SOLN
5.0000 mg | INTRAVENOUS | Status: AC
  Administered 2019-05-07: 5 mg via INTRAVENOUS
  Filled 2019-05-07: qty 5

## 2019-05-07 MED ORDER — FUROSEMIDE 10 MG/ML IJ SOLN
20.0000 mg | Freq: Once | INTRAMUSCULAR | Status: DC
  Filled 2019-05-07: qty 2

## 2019-05-07 MED ORDER — LIDOCAINE-PRILOCAINE 2.5-2.5 % EX CREA: TOPICAL_CREAM | CUTANEOUS | 3 refills | Status: AC

## 2019-05-07 NOTE — TOC Initial Note (Signed)
Transition of Care [facility]) - Initial/Assessment Note    Patient Details  Name: Brittany Rodgers MRN: [id number] Date of Birth: 01/18/1971  Transition of [facility]) CM/SW Contact:    Nila Nephew, LCSW Phone Number: [phone] 05/07/2019, 10:43 AM  Clinical Narrative:    Pt admitted with pleural effusion, also patient of [facility] with metastatic breast Ca, plan for chemotherapy possibly next week per patient report and chart. Pt reports she was getting outpatient PT prior to admission at [facility] and plans to continue once she is home. Otherwise reports unsure yet of her DC needs.  TOC team will follow for needs.                  Barriers to Discharge: Continued Medical Work up   Patient Goals and CMS Choice Patient states their goals for this hospitalization and ongoing recovery are:: "Start chemo. I am going to continue with therapy once I'm home"      Expected Discharge Plan and Services     Discharge Planning Services: CM Consult     Expected Discharge Date: (unknown)                                    Prior Living Arrangements/Services   Lives with:: Spouse   Do you feel safe going back to the place where you live?: Yes      Need for Family Participation in Patient Care: No (Comment) Care giver support system in place?: Yes (comment)(husband)   Criminal Activity/Legal Involvement Pertinent to Current Situation/Hospitalization: No - Comment as needed  Activities of Daily Living Home Assistive Devices/Equipment: Shower chair without back, Environmental consultant (specify type)(front wheeled walker) ADL Screening (condition at time of admission) Patient's cognitive ability adequate to safely complete daily activities?: Yes Is the patient deaf or have difficulty hearing?: No Does the patient have difficulty seeing, even when wearing glasses/contacts?: No Does the patient have difficulty concentrating, remembering, or making decisions?: No Patient able to  express need for assistance with ADLs?: Yes Does the patient have difficulty dressing or bathing?: No Independently performs ADLs?: Yes (appropriate for developmental age) Does the patient have difficulty walking or climbing stairs?: No Weakness of Legs: None Weakness of Arms/Hands: None  Permission Sought/Granted                  Emotional Assessment Appearance:: Appears stated age Attitude/Demeanor/Rapport: Engaged Affect (typically observed): Pleasant Orientation: : Oriented to Self, Oriented to Place, Oriented to  Time, Oriented to Situation Alcohol / Substance Use: Not Applicable Psych Involvement: No (comment)  Admission diagnosis:  Pleural effusion [J90] Tachycardia [R00.0] Patient Active Problem List   Diagnosis Date Noted  . Goals of care, counseling/discussion 04/25/2019  . Pleural effusion 04/23/2019  . Pleural effusion on right 04/11/2019  . Breast cancer, right ([place]) 03/04/2019  . S/P mastectomy 03/04/2019  . Anemia due to antineoplastic chemotherapy 02/04/2019  . Port-A-Cath in place 01/07/2019  . Family history of breast cancer   . Malignant neoplasm of lower-outer quadrant of right breast of female, estrogen receptor negative ([place]) 11/04/2018  . Thoracic spine pain 05/03/2018  . Hypokalemia 02/13/2017  . Chronic right hip pain 11/26/2015  . Hyperlipidemia 12/23/2010  . ALLERGIC RHINITIS CAUSE UNSPECIFIED 07/24/2008  . GERD 10/14/2007  . IBS 10/14/2007   PCP:  [name], MD Pharmacy:   [facility] ([place]), [place] - [address] [place] [address] Phone: [phone] Fax: [place], [place] [address] Phone: [phone] Fax: [phone]     Social Determinants of Health (SDOH) Interventions    Readmission Risk Interventions No flowsheet data found.

## 2019-05-07 NOTE — Progress Notes (Addendum)
[place]   DOB:Feb 15, 1971   UJ#:[id number]   NFA#:[id number]  Subjective:  Brittany Rodgers is still having some dyspnea. However, this improves with drainage of Pleurex catheter. TMax 99.4 in the past 24 hours. Pleural fluid sent for cell count and culture. Culture negative to date. WBC in the pleural fluid was elevated, but this is decreased compared with sample form 3 weeks ago. Remains on IV antibiotics. Pain controlled. Bowels moving.   Objective: young [place] American woman who appears comfortable in bed. Vitals:   05/07/19 1125 05/07/19 1127  BP: (!) 178/120 (!) 155/101  Pulse: (!) 120 (!) 122  Resp: 20 (!) 24  Temp: 98.9 F (37.2 C) 98.9 F (37.2 C)  SpO2: 98% 98%    Body mass index is 31.64 kg/m.  Intake/Output Summary (Last 24 hours) at 05/07/2019 1144 Last data filed at 05/07/2019 1000 Gross per 24 hour  Intake 2369.11 ml  Output 4150 ml  Net -1780.89 ml   General: Awake and alert, comfortable.  Appears [place] of breath at times Skin: No rashes HEENT: No thrush or mucositis Lungs: Diminished breath sounds  Cardiovascular: Tachycardic, no murmur.  No lower extremity edema. Abdomen: Positive bowel sounds, soft, nontender Neuro: Alert and oriented x3   CBG (last 3)  No results for input(s): GLUCAP in the last 72 hours.   Labs:  Lab Results  Component Value Date   WBC 7.7 05/07/2019   HGB 8.9 (L) 05/07/2019   HCT 30.7 (L) 05/07/2019   MCV 83.4 05/07/2019   PLT 388 05/07/2019   NEUTROABS 5.2 05/07/2019    @LASTCHEMISTRY @  Urine Studies No results for input(s): UHGB, CRYS in the last 72 hours.  Invalid input(s): UACOL, UAPR, USPG, UPH, UTP, UGL, [place], UBIL, UNIT, UROB, [place], UEPI, UWBC, Junie Panning [place], [place], [place]  Basic Metabolic Panel: Recent Labs  Lab 05/02/19 0345 05/03/19 0349 05/04/19 0346 05/05/19 0416 05/06/19 0426 05/07/19 0600  NA 140 138 142 136 140 140  K 3.6 3.3* 3.6 3.6 3.5 3.1*  CL 106 107 112* 105 105 105  CO2 23 23 24 24 24 25    GLUCOSE 113* 113* 117* 112* 112* 119*  BUN 8 14 13 6 6  <5*  CREATININE 0.73 0.92 0.50 0.45 0.56 0.54  CALCIUM 9.5 8.1* 7.4* 6.9* 6.7* 6.7*  MG 1.6* 1.7  --  1.7  --   --   PHOS  --   --   --  1.3* 1.2*  --    GFR Estimated Creatinine Clearance: 82.6 mL/min (by C-G formula based on SCr of 0.54 mg/dL). Liver Function Tests: Recent Labs  Lab 05/01/19 0402 05/03/19 0349 05/06/19 0426  AST 11* 17 13*  ALT 18 17 18   ALKPHOS 107 78 118  BILITOT 0.4 0.4 0.4  PROT 6.9 5.4* 6.1*  ALBUMIN 3.3* 2.4* 2.3*   No results for input(s): LIPASE, AMYLASE in the last 168 hours. No results for input(s): AMMONIA in the last 168 hours. Coagulation profile Recent Labs  Lab 05/01/19 0402  INR 1.1    CBC: Recent Labs  Lab 05/01/19 0402  05/03/19 0349 05/04/19 0346 05/05/19 0416 05/06/19 0426 05/07/19 0600  WBC 8.3   < > 10.0 9.8 8.4 6.7 7.7  NEUTROABS 6.1  --   --  7.0 6.2 4.5 5.2  HGB 9.8*   < > 9.1* 8.5* 8.9* 8.6* 8.9*  HCT 33.4*   < > 31.4* 28.6* 30.0* 28.7* 30.7*  MCV 86.8   < > 86.0 84.9 84.7 84.2 83.4  PLT 355   < > 334 302 366 333 388   < > = values in this interval not displayed.   Cardiac Enzymes: No results for input(s): CKTOTAL, CKMB, CKMBINDEX, TROPONINI in the last 168 hours. BNP: Invalid input(s): POCBNP CBG: No results for input(s): GLUCAP in the last 168 hours. D-Dimer No results for input(s): DDIMER in the last 72 hours. Hgb A1c No results for input(s): HGBA1C in the last 72 hours. Lipid Profile No results for input(s): CHOL, HDL, LDLCALC, TRIG, CHOLHDL, LDLDIRECT in the last 72 hours. Thyroid function studies No results for input(s): TSH, T4TOTAL, T3FREE, THYROIDAB in the last 72 hours.  Invalid input(s): FREET3 Anemia work up No results for input(s): VITAMINB12, FOLATE, FERRITIN, TIBC, IRON, RETICCTPCT in the last 72 hours. Microbiology Recent Results (from the past 240 hour(s))  Novel Coronavirus, NAA (hospital order; send-out to ref lab)     Status:  None   Collection Time: 04/29/19 12:02 PM   Specimen: Nasopharyngeal Swab; Respiratory  Result Value Ref Range Status   SARS-CoV-2, NAA NOT DETECTED NOT DETECTED Final    Comment: (NOTE) This test was developed and its performance characteristics determined by [employer]. This test has not been FDA cleared or approved. This test has been authorized by FDA under an Emergency Use Authorization (EUA). This test is only authorized for the duration of time the declaration that circumstances exist justifying the authorization of the emergency use of in vitro diagnostic tests for detection of SARS-CoV-2 virus and/or diagnosis of COVID-19 infection under section 564(b)(1) of the Act, 21 U.S.C. 893YBO-1(B)(5), unless the authorization is terminated or revoked sooner. When diagnostic testing is negative, the possibility of a false negative result should be considered in the context of a patient's recent exposures and the presence of clinical signs and symptoms consistent with COVID-19. An individual without symptoms of COVID-19 and who is not shedding SARS-CoV-2 virus would expect to have a negative (not detected) result in this assay. Performed  At: [facility] [place], [place] [id number] Rush Farmer MD OE:[id number]    [place]  Final    Comment: Performed at [facility] [address], [place], [place] [address]  Culture, blood (routine x 2)     Status: None (Preliminary result)   Collection Time: 05/02/19  7:44 AM   Specimen: BLOOD  Result Value Ref Range Status   Specimen Description   Final    BLOOD LEFT ARM Performed at [facility] [address] [place], [place] [address]    Special Requests   Final    BOTTLES DRAWN AEROBIC AND ANAEROBIC Blood Culture adequate volume Performed at [facility] [address] [place], [place] [address]    Culture   Final     NO GROWTH 4 DAYS Performed at [facility], [place] [address], [place], [place] [address]    Report Status PENDING  Incomplete  Culture, blood (routine x 2)     Status: None (Preliminary result)   Collection Time: 05/02/19  7:44 AM   Specimen: BLOOD LEFT HAND  Result Value Ref Range Status   Specimen Description   Final    BLOOD LEFT HAND Performed at [facility] [address] [place], [place] [address]    Special Requests   Final    BOTTLES DRAWN AEROBIC AND ANAEROBIC Blood Culture adequate volume Performed at [facility] [address] [place], [place] [address]  Culture   Final    NO GROWTH 4 DAYS Performed at [facility], [place] [address], [place], [place] [address]    Report Status PENDING  Incomplete  Culture, blood (Routine X 2) w Reflex to ID Panel     Status: None (Preliminary result)   Collection Time: 05/05/19 10:03 AM   Specimen: BLOOD  Result Value Ref Range Status   Specimen Description   Final    BLOOD LEFT ANTECUBITAL Performed at [facility], [place] [address], [place], [place] [address]    Special Requests   Final    BOTTLES DRAWN AEROBIC AND ANAEROBIC Blood Culture adequate volume Performed at [facility] [address], [place], [place] [address]    Culture   Final    NO GROWTH 1 DAY Performed at [facility], [place] [address], [place], [place] [address]    Report Status PENDING  Incomplete  Culture, blood (Routine X 2) w Reflex to ID Panel     Status: None (Preliminary result)   Collection Time: 05/05/19 10:12 AM   Specimen: BLOOD LEFT HAND  Result Value Ref Range Status   Specimen Description   Final    BLOOD LEFT HAND Performed at [facility] [address] [place], [place] [address]    Special Requests   Final    BOTTLES DRAWN AEROBIC ONLY Blood Culture adequate volume Performed at [facility] [address]  [place], [place] [address]    Culture   Final    NO GROWTH 1 DAY Performed at [facility], [place] [address], [place], [place] [address]    Report Status PENDING  Incomplete  Body fluid culture     Status: None (Preliminary result)   Collection Time: 05/06/19  1:58 PM   Specimen: Pleura; Body Fluid  Result Value Ref Range Status   Specimen Description   Final    PLEURAL Performed at [facility] [address] [place], [place] [address]    Special Requests   Final    NONE Performed at [facility], [place] [address] [place], [place] [address]    Gram Stain   Final    ABUNDANT WBC PRESENT,BOTH PMN AND MONONUCLEAR NO ORGANISMS SEEN    Culture   Final    NO GROWTH < 24 HOURS Performed at [facility] [address] [place], [place] [address]    Report Status PENDING  Incomplete      Studies:  No results found.  Assessment: 48 y.o. [place] Parkins, [place] woman status post right breast lower outer quadrant biopsy 10/29/2018 for a clinical T2 N0, stage IIB invasive ductal carcinoma, grade 3, triple negative, with an MIB-1 of 80%             (a) CT of the chest 11/15/2018 shows possible liver metastases, but liver biopsy 11/25/2018 consistent with hemangioma             (b) bone scan 11/15/2018 shows no suspicious bone lesions  (1) neoadjuvant chemotherapy consisting of doxorubicin and cyclophosphamide in dose dense fashion x4, started 11/26/2018, completed 01/07/2019, followed by weekly paclitaxel and carboplatin x12 starting 01/28/2019  (2) patient desires ovarian function preservation:              (a) Zoladex started 11/08/2018, discontinued 02/04/2019             (b) patient is status post bilateral tubal ligation  (  3) status post right mastectomy with sentinel lymph node sampling 03/04/2019 for a ypTac ypN1a invasive ductal carcinoma grade 3, repeat prognostic panel again triple negative             (a) a total of 6 axillary lymph  nodes removed, 2 sentinel ones, 1 positive  (4) adjuvant radiation to be considered  (5) genetics counseling on 11/28/2018, testing was recommended, but declined by patient.  METASTATIC DISEASE: July 2020 (6) Staging studies:             (a) chest CT scan and bone scan 11/15/2018 show a liver mass, no lung or bone lesions             (b) liver biopsy 11/25/2018 shows a hemangioma             (c) chest CT Angio 03/26/2019 again shows liver mass, right breast "seroma"  (d) cytology from R thoracentesis 04/12/2019 nondiagnostic  (e) CT angio 04/23/2019 shows recurrent right effusion, multiple bilateral pulmonary nodules, invasive anterior right chest wall lesion  (7) pembrolizumab started 04/02/2019, to be repeated every 21 days  (8) CT angio 04/23/2019 shows new lung nodules, likely metastatic  (9) repeat thoracentesis 04/24/2019-- cytology positive  (10) biopsy R chest wall mass 04/24/2019 confirms recurrent/metastatic breast cancer  (a) CARIS study results pending  (11) to start carboplatin and gemcitabine when stable, to be repeated days 1 and 8 of each 21-day cycle    Plan:  [name]'s dyspnea is improving overall. Nursing has been able to drain 400-500 cc/day. Will change order to drain Pleurex daily with a maximum of 500 cc to be removed in one day. The patient and her family will need education on Pleurex catheter management in the home. Will need home health for nursing and supplies.   Fevers improved. Unclear if due to infection versus tumor fever.  Appreciate infectious disease consult.  Repeat blood cultures are negative to date.  Constipation has improved with bowel regimen.  Continue current bowel regimen.  Pain is currently controlled.  Continue current pain medications.  We will plan to start the patient on carboplatin and gemcitabine.  This will be given on day 1 and day 8 of each 21-day cycle.  She will also continue on pembrolizumab.  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 Chemotherapy will be delayed until next week once she is discharged from the hospital.   Mikey Bussing, NP 05/07/2019  11:44 AM Medical Oncology and Hematology [facility] [place], [place] [address] Tel. [phone]    Fax. [phone]   ADDENDUM: Brittany Rodgers is slowly becoming more stable. She is learning that if she does not take a bowel prophylaxis regimen she will be constipated. She is managing with her current pain medicines, which will be continued as outpatient and further titrated as needed.  The one remaining issue is the temperatures. However if all cultures remain negative we will conclude this was indeed tumor fever and proceed with treatment as outpatient.  She has been scheduled for chemotherapy 08/18, and I will see her that same day.  Thank you again for you excellent care to this complex patient!       Chauncey Cruel, MD Medical Oncology and Hematology [facility] [address] [place], [place] [address] Tel. [phone]    Fax. [phone]

## 2019-05-07 NOTE — Progress Notes (Signed)
Pt refused HPN needle change.  Pt uncooperative and will not even verbally respond.  Pt will turn self to one side of bed and will cover self with blanket with every attempt to explain procedure.  Primary RN was made aware.

## 2019-05-07 NOTE — Progress Notes (Signed)
Physical Therapy Treatment Patient Details Name: Brittany Rodgers MRN: [id number] DOB: April 17, 1971 Today's Date: 05/07/2019    History of Present Illness Brittany Rodgers is a 48 y.o. female with medical history significant of metastatic breast cancer s/p mastectomy. She presented to ED on 04/23/19 with complaints of increased sob and lethargy. Found to have pleural effusion,SIRS with acute metabolic encephalopathy    PT Comments    Assisted OOB to amb in hallway.  General bed mobility comments: increased time x 2 and repeat VC's to stay on task.  General transfer comment: Pt requires increased time and repeat VC's for direction and safety with turns.  Also assisted with toilet transfer.  Mild unsteady. General Gait Details: slow with mild unsteadiness esp with turns and narrow spacing.  Mild drift and slow/sluggish gait.  X 2 slight LOB amb around bed to bathroom, pt self corrected.  Tolerated an increased distance in hallway.  RA 98%.  No SOB just fatigue.  Increased R chest pain at Biopsy site. Assisted to bathroom then back to bed.   Overall pt is quite and slow to respond as well as intermittent daze.    Follow Up Recommendations  Outpatient PT     Equipment Recommendations  None recommended by PT    Recommendations for Other Services       Precautions / Restrictions Precautions Precautions: Fall Precaution Comments: R recent mastectomy and R chest wall Biopsy Restrictions Weight Bearing Restrictions: No Other Position/Activity Restrictions: WBAT    Mobility  Bed Mobility Overal bed mobility: Needs Assistance Bed Mobility: Supine to Sit;Sit to Supine     Supine to sit: Supervision Sit to supine: Supervision   General bed mobility comments: increased time x 2 and repeat VC's to stay on task  Transfers Overall transfer level: Needs assistance Equipment used: None Transfers: Sit to/from [facility] Sit to Stand: Min guard;Min assist Stand pivot  transfers: Min guard;Min assist       General transfer comment: Pt requires increased time and repeat VC's for direction and safety with turns.  Also assisted with toilet transfer.  Mild unsteady.  Ambulation/Gait Ambulation/Gait assistance: Min guard;Min assist Gait Distance (Feet): 75 Feet Assistive device: 1 person hand held assist Gait Pattern/deviations: Step-through pattern;Decreased stride length;Drifts right/left Gait velocity: decreased   General Gait Details: slow with mild unsteadiness esp with turns and narrow spacing.  Mild drift and slow/sluggish gait.  X 2 slight LOB amb around bed to bathroom, pt self corrected.  Tolerated an increased distance in hallway.  RA 98%.  No SOB just fatigue.  Increased R chest pain at Biopsy site.   Stairs             Wheelchair Mobility    Modified Rankin (Stroke Patients Only)       Balance Overall balance assessment: Needs assistance Sitting-balance support: No upper extremity supported;Feet supported Sitting balance-Leahy Scale: Good     Standing balance support: No upper extremity supported;During functional activity Standing balance-Leahy Scale: Fair                              Cognition Arousal/Alertness: Lethargic;Suspect due to medications(however RN reports that pt has been in and out of it all day) Behavior During Therapy: Flat affect Overall Cognitive Status: Impaired/Different from baseline Area of Impairment: Orientation;Memory;Following commands;Safety/judgement                 Orientation Level: Place(see general comments)  Memory: Decreased [place]-term memory Following Commands: Follows one step commands with increased time Safety/Judgement: Decreased awareness of safety     General Comments: slow to respond but following all directions with repeat VC's to stay on task      Exercises Other Exercises Other Exercises: Pt completed 10 reps of hands together and lift towards  overhead and 10 reps of shoulder retraction/protraction Other Exercises: Pt engaged in protraction adn retraction of shd girdle while seated at EOB. 5 reps with 2 second hold. Pt advised to squeeze to attempt to touch therapist finger.     General Comments General comments (skin integrity, edema, etc.): SpO2 94      Pertinent Vitals/Pain Pain Assessment: Faces Faces Pain Scale: Hurts a little bit Pain Location: Rt chest Pain Descriptors / Indicators: Sore;Discomfort Pain Intervention(s): Monitored during session    Home Living                      Prior Function            PT Goals (current goals can now be found in the care plan section) Acute Rehab PT Goals Patient Stated Goal: no specific goals stated by pt, she wants to continue OPPT Progress towards PT goals: Progressing toward goals    Frequency    Min 3X/week      PT Plan Current plan remains appropriate    Co-evaluation              AM-PAC PT "6 Clicks" Mobility   Outcome Measure  Help needed turning from your back to your side while in a flat bed without using bedrails?: A Little Help needed moving from lying on your back to sitting on the side of a flat bed without using bedrails?: A Little Help needed moving to and from a bed to a chair (including a wheelchair)?: A Little Help needed standing up from a chair using your arms (e.g., wheelchair or bedside chair)?: A Little Help needed to walk in hospital room?: A Little Help needed climbing 3-5 steps with a railing? : A Little 6 Click Score: 18    End of Session Equipment Utilized During Treatment: Gait belt Activity Tolerance: Patient limited by fatigue;Other (comment)(limited due to cognitiion) Patient left: in bed;with call bell/phone within reach;with bed alarm set Nurse Communication: Mobility status PT Visit Diagnosis: Muscle weakness (generalized) (M62.81);Difficulty in walking, not elsewhere classified (R26.2)     Time:  1300-1320 PT Time Calculation (min) (ACUTE ONLY): 20 min  Charges:  $Gait Training: 8-22 mins                     Rica Koyanagi  PTA Acute  Rehabilitation Services Pager      [phone] Office      [phone]

## 2019-05-07 NOTE — Progress Notes (Signed)
Occupational Therapy Treatment Patient Details Name: Brittany Rodgers MRN: [id number] DOB: 13-Dec-1970 Today's Date: 05/07/2019    History of present illness Brittany Rodgers is a 48 y.o. female with medical history significant of metastatic breast cancer s/p mastectomy. She presented to ED on 04/23/19 with complaints of increased sob and lethargy. Found to have pleural effusion,SIRS with acute metabolic encephalopathy   OT comments  Pt slowly progressing toward OT goals. Pt demonstrated improvement in orientation, with less confusion of location, however, pt still limited in activities due to being lethargic. Pt engaged in eating at bed level and EOB with UE exercises provided from previous session (exercises listed below with detail.) Pt will benefit from continued acute OT to address safety in ADLs and improved strength and ROM of RUE. DC and freq remains the same. OT will continue to follow acutely.    Follow Up Recommendations  Outpatient OT;Supervision/Assistance - 24 hour(some confusion today--if does not clear may need different venue if not 24 hour care)    Equipment Recommendations  None recommended by OT    Recommendations for Other Services      Precautions / Restrictions Precautions Precautions: Fall Precaution Comments: R recent mastectomy Restrictions Weight Bearing Restrictions: No Other Position/Activity Restrictions: WBAT       Mobility Bed Mobility Overal bed mobility: Needs Assistance Bed Mobility: Supine to Sit;Sit to Supine     Supine to sit: Supervision;HOB elevated(increased time) Sit to supine: Supervision(HOB flat, normal time)   General bed mobility comments: no physical assistance required for bed mobility.   Transfers Overall transfer level: Needs assistance Equipment used: None                  Balance Overall balance assessment: Needs assistance Sitting-balance support: No upper extremity supported;Feet supported Sitting  balance-Leahy Scale: Good     Standing balance support: No upper extremity supported;During functional activity Standing balance-Leahy Scale: Fair                             ADL either performed or assessed with clinical judgement   ADL Overall ADL's : Needs assistance/impaired Eating/Feeding: Set up;Sitting Eating/Feeding Details (indicate cue type and reason): in bed                                   General ADL Comments: Pt reports feelings tired, session limited to EOB and bed level task.     Vision       Perception     Praxis      Cognition Arousal/Alertness: Lethargic;Suspect due to medications(however RN reports that pt has been in and out of it all day) Behavior During Therapy: Flat affect Overall Cognitive Status: Impaired/Different from baseline Area of Impairment: Orientation;Memory;Following commands;Safety/judgement                 Orientation Level: Place(see general comments)   Memory: Decreased Granito-term memory Following Commands: Follows one step commands with increased time Safety/Judgement: Decreased awareness of safety     General Comments: improved responses to orientation questions. Able to mention year, day, and location.         Exercises Exercises: Other exercises Other Exercises Other Exercises: Pt completed 10 reps of hands together and lift towards overhead and 10 reps of shoulder retraction/protraction Other Exercises: Pt engaged in protraction adn retraction of shd girdle while seated at EOB. 5 reps with  2 second hold. Pt advised to squeeze to attempt to touch therapist finger.    Shoulder Instructions       General Comments SpO2 94    Pertinent Vitals/ Pain       Pain Assessment: No/denies pain Pain Location: Rt chest Pain Descriptors / Indicators: Sore Pain Intervention(s): Monitored during session;Repositioned  Home Living                                          Prior  Functioning/Environment              Frequency  Min 2X/week        Progress Toward Goals  OT Goals(current goals can now be found in the care plan section)  Progress towards OT goals: Progressing toward goals(less confusion, however, still lethargic.)  Acute Rehab OT Goals Patient Stated Goal: no specific goals stated by pt, she wants to continue OPPT OT Goal Formulation: With patient Time For Goal Achievement: 05/13/19 Potential to Achieve Goals: Good ADL Goals Pt Will Perform Lower Body Bathing: with modified independence;with adaptive equipment;sitting/lateral leans;sit to/from stand Pt Will Perform Lower Body Dressing: with modified independence;sitting/lateral leans;sit to/from stand;with adaptive equipment Pt Will Transfer to Toilet: with modified independence Pt Will Perform Tub/Shower Transfer: with modified independence;shower seat;rolling walker Additional ADL Goal #1: pt will gather clothes and complete adl at mod I level Additional ADL Goal #2: pt will be independent with HEP for RUE  Plan Discharge plan remains appropriate    Co-evaluation                 AM-PAC OT "6 Clicks" Daily Activity     Outcome Measure   Help from another person eating meals?: None Help from another person taking care of personal grooming?: A Little Help from another person toileting, which includes using toliet, bedpan, or urinal?: A Little Help from another person bathing (including washing, rinsing, drying)?: A Little Help from another person to put on and taking off regular upper body clothing?: A Little Help from another person to put on and taking off regular lower body clothing?: A Little 6 Click Score: 19    End of Session    OT Visit Diagnosis: Unsteadiness on feet (R26.81);Other abnormalities of gait and mobility (R26.89);Other symptoms and signs involving cognitive function Pain - Right/Left: Right Pain - part of body: Arm   Activity Tolerance Patient limited  by lethargy   Patient Left in bed;with call bell/phone within reach;with bed alarm set(sitting eating meal)   Nurse Communication          Time: 7628-3151 OT Time Calculation (min): 27 min  Charges: OT General Charges $OT Visit: 1 Visit OT Treatments $Self Care/Home Management : 23-37 mins  Minus Breeding, MSOT, OTR/L  Supplemental Rehabilitation Services  [phone]    Marius Ditch 05/07/2019, 3:12 PM

## 2019-05-07 NOTE — Progress Notes (Signed)
   05/07/19 1127  MEWS Score  Resp (!) 24  Pulse Rate (!) 122  BP (!) 155/101  Temp 98.9 F (37.2 C)  SpO2 98 %  O2 Device Nasal Cannula  O2 Flow Rate (L/min) 2 L/min  MEWS Score  MEWS RR 1  MEWS Pulse 2  MEWS Systolic 0  MEWS LOC 0  MEWS Temp 0  MEWS Score 3  MEWS Score Color Yellow   Pt given PRN BP meds per eMAR. [place] Pink MD paged and will carry out all orders placed.

## 2019-05-07 NOTE — Progress Notes (Signed)
Pt tolerated draining of plureX well with 465mL of drak red fluid removed. Pt denies any pain with no s/s of distress noted. Clean dressing placed.

## 2019-05-07 NOTE — Progress Notes (Signed)
Pt continues to c/o being cold, keeping covers pulled over face. Pt c/o cold sweats. Pt will sit to take pain meds.

## 2019-05-07 NOTE — Progress Notes (Signed)
Pt found with covers over her head, initially not responding to RN. Pt was reluctant to communicate stating "my head hurts and I'm just tired." RN checked VS and gave medications, see MAR. Pt is diaphoretic w/ no temp and stating she is cold. Will reassess VS to ensure no temperature.

## 2019-05-07 NOTE — Progress Notes (Signed)
PROGRESS NOTE  Brittany Rodgers ZOX:[id number] DOB: October 17, 1970 DOA: 04/23/2019 PCP: [name], MD  HPI/Recap of past 24 hours: Brittany Rodgers is a 48 y.o. 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    She had been seen on the day prior in the oncology clinic and was noted to have a new chest wall mass as well as liver mass. Pt was being considered for possible biopsy as an outpatient.  Next day, patient became increasingly Sessler of breath and tachycardic.  She was directed to ED for inpatient admission and management.  In the ED, patient was hypoxic and tachycardic up to 140s with a CT scan of chest on admission showing findings worrisome for large right sided effusion.  Patient was admitted under hospitalist service for further evaluation and management.  Oncology and IR consult was obtained.  On 7/30, patient underwent thoracentesis and biopsy of chest wall mass.  Fluid cytology and biopsy of the mass positive for metastatic disease.  On 8/6, patient underwent Pleurx catheter placement on right side.  Initially scheduled then for discharge, but this was canceled due to intermittent high fever and continued shortness of breath.  Seen by infectious disease who agreed with covering her with broad-spectrum antibiotics, but suspected work-up is negative and fever could be attributed to her chest mass/cancer.  Patient doing better today.  Breathing easier after draining Pleurx catheter yesterday and again this morning.  Bowels moving okay, pain tolerable.  Assessment/Plan: Principal Problem:   Pleural effusion Active Problems:   IBS   Hyperlipidemia   Chronic right hip pain   Malignant neoplasm of lower-outer quadrant of right breast of female, estrogen receptor negative (HCC)   S/P mastectomy   Pleural effusion on right SIRS with acute  metabolic encephalopathy  -Patient on admission was started on broad-spectrum antibiotics.  No source identified.  Antibiotics stopped on 8/6.  Continue to spike intermittent high fever.  However, WBC, lactic acid level normal, procalcitonin level continue to trend down.  On 8/8,, Iintermittent fever spike was related to cancer.  However, patient is getting tachycardic and more drowsy in last 24 hours.  T-max 103 in last 24 hours.  Infection work-up negative.  Seen by ID who suspected that cancer could be causing fever.  Recommended continuing antibiotics until 8/12.  Pleural culture sent and are pending. Discontinue antibiotics on 8/13 if patient has negative pleural cultures.  Metastatic breast cancer (invasive ductal carcinoma), status post right mastectomy  Recent diagnosis of metastasis to chest wall and right pleural effusion -Followed by [name], oncology as outpatient. -Pleurx catheter was placed on 8/6 but apparently there was no order to drain it.  So patient did not get it drained for last 4 days.  Large right pleural effusion noted on repeat chest x-ray today. Has been ordered to drain Pleurx catheter every other day. -Noted a plan from oncology to start additional chemotherapy: Carboplatin and gemcitabine, possibly next week, following discharge  Right axillary pain -At the site of metastasis to anterior right chest wall and pectoralis muscle.  Patient is on MSIR and MS contin for chronic pain.PRN IV morphine.  Sinus tachycardia -mostly over 120s in last 24 hours.  Likely related to large right pleural effusion.    Gave 1 dose of Lasix in case of volume overload.  Have increased dose of metoprolol  Constipation -Stool softeners  ordered.  Also on MiraLAX twice daily.  Hyperlipidemia-on statin.  At risk of liver injury due to metastasis and chemotherapy.  No real benefit of statin because of coexisting metastatic cancer.  Statin discontinued  Hypercalcemia:Likely  secondary to malignancy.As perDr. Jana Hakim hemato-oncology patient receivedpamidronate 90 mg x 1 with improvement in calcium levels. Calcium level decreasing now, 6.9 today.    Follow calcium and albumin  Debility, weakness. Seen by physical therapy, occupational therapy. Recommend home health/outpatient PT on discharge.  Code Status: Full code  Family Communication: Left message for family  Disposition Plan: Home in the next 1 to 2 days   Consultants:  Oncology  Interventional radiology  Infectious disease  Procedures: 7/30 - thoracentesis and biopsy of chest wall mass.   8/6 - Pleurx catheter placement on right side.    Antimicrobials:  IV Zosyn and vancomycin 8/10-8/12  DVT prophylaxis: Lovenox   Objective: Vitals:   05/07/19 1125 05/07/19 1127  BP: (!) 178/120 (!) 155/101  Pulse: (!) 120 (!) 122  Resp: 20 (!) 24  Temp: 98.9 F (37.2 C) 98.9 F (37.2 C)  SpO2: 98% 98%    Intake/Output Summary (Last 24 hours) at 05/07/2019 1252 Last data filed at 05/07/2019 1000 Gross per 24 hour  Intake 2369.11 ml  Output 4150 ml  Net -1780.89 ml   Filed Weights   04/24/19 0003 05/07/19 1200  Weight: 78.5 kg 79.5 kg   Body mass index is 32.06 kg/m.  Exam:   General: Alert and oriented x3, no acute distress  HEENT: Normocephalic and atraumatic, mucous membrane slightly dry  Neck: Supple, no JVD  Cardiovascular: Regular rhythm, tachycardic  Respiratory: Decreased breath sounds bibasilar, noted Pleurx catheter, no labored breathing  Abdomen: Soft, nontender, nondistended, positive bowel sounds  Musculoskeletal: No clubbing or cyanosis, trace pitting edema  Skin: No skin breaks, tears or lesions  Psychiatry: Appropriate, no evidence of psychoses  Neuro: No focal deficits   Data Reviewed: CBC: Recent Labs  Lab 05/01/19 0402  05/03/19 0349 05/04/19 0346 05/05/19 0416 05/06/19 0426 05/07/19 0600  WBC 8.3   < > 10.0 9.8 8.4 6.7 7.7   NEUTROABS 6.1  --   --  7.0 6.2 4.5 5.2  HGB 9.8*   < > 9.1* 8.5* 8.9* 8.6* 8.9*  HCT 33.4*   < > 31.4* 28.6* 30.0* 28.7* 30.7*  MCV 86.8   < > 86.0 84.9 84.7 84.2 83.4  PLT 355   < > 334 302 366 333 388   < > = values in this interval not displayed.   Basic Metabolic Panel: Recent Labs  Lab 05/02/19 0345 05/03/19 0349 05/04/19 0346 05/05/19 0416 05/06/19 0426 05/07/19 0600  NA 140 138 142 136 140 140  K 3.6 3.3* 3.6 3.6 3.5 3.1*  CL 106 107 112* 105 105 105  CO2 23 23 24 24 24 25   GLUCOSE 113* 113* 117* 112* 112* 119*  BUN 8 14 13 6 6  <5*  CREATININE 0.73 0.92 0.50 0.45 0.56 0.54  CALCIUM 9.5 8.1* 7.4* 6.9* 6.7* 6.7*  MG 1.6* 1.7  --  1.7  --   --   PHOS  --   --   --  1.3* 1.2*  --    GFR: Estimated Creatinine Clearance: 83.1 mL/min (by C-G formula based on SCr of 0.54 mg/dL). Liver Function Tests: Recent Labs  Lab 05/01/19 0402 05/03/19 0349 05/06/19 0426  AST 11* 17 13*  ALT 18 17 18   ALKPHOS 107 78 118  BILITOT 0.4  0.4 0.4  PROT 6.9 5.4* 6.1*  ALBUMIN 3.3* 2.4* 2.3*   No results for input(s): LIPASE, AMYLASE in the last 168 hours. No results for input(s): AMMONIA in the last 168 hours. Coagulation Profile: Recent Labs  Lab 05/01/19 0402  INR 1.1   Cardiac Enzymes: No results for input(s): CKTOTAL, CKMB, CKMBINDEX, TROPONINI in the last 168 hours. BNP (last 3 results) No results for input(s): PROBNP in the last 8760 hours. HbA1C: No results for input(s): HGBA1C in the last 72 hours. CBG: No results for input(s): GLUCAP in the last 168 hours. Lipid Profile: No results for input(s): CHOL, HDL, LDLCALC, TRIG, CHOLHDL, LDLDIRECT in the last 72 hours. Thyroid Function Tests: No results for input(s): TSH, T4TOTAL, FREET4, T3FREE, THYROIDAB in the last 72 hours. Anemia Panel: No results for input(s): VITAMINB12, FOLATE, FERRITIN, TIBC, IRON, RETICCTPCT in the last 72 hours. Urine analysis:    Component Value Date/Time   COLORURINE YELLOW 04/24/2019  1700   APPEARANCEUR HAZY (A) 04/24/2019 1700   LABSPEC 1.025 04/24/2019 1700   PHURINE 5.0 04/24/2019 1700   GLUCOSEU NEGATIVE 04/24/2019 1700   HGBUR NEGATIVE 04/24/2019 1700   BILIRUBINUR NEGATIVE 04/24/2019 1700   BILIRUBINUR negative 05/31/2018 1011   KETONESUR NEGATIVE 04/24/2019 1700   PROTEINUR NEGATIVE 04/24/2019 1700   UROBILINOGEN 0.2 05/31/2018 1011   UROBILINOGEN 0.2 05/18/2010 1042   NITRITE NEGATIVE 04/24/2019 1700   LEUKOCYTESUR LARGE (A) 04/24/2019 1700   Sepsis Labs: @LABRCNTIP (procalcitonin:4,lacticidven:4)  ) Recent Results (from the past 240 hour(s))  Novel Coronavirus, NAA (hospital order; send-out to ref lab)     Status: None   Collection Time: 04/29/19 12:02 PM   Specimen: Nasopharyngeal Swab; Respiratory  Result Value Ref Range Status   SARS-CoV-2, NAA NOT DETECTED NOT DETECTED Final    Comment: (NOTE) This test was developed and its performance characteristics determined by [employer]. This test has not been FDA cleared or approved. This test has been authorized by FDA under an Emergency Use Authorization (EUA). This test is only authorized for the duration of time the declaration that circumstances exist justifying the authorization of the emergency use of in vitro diagnostic tests for detection of SARS-CoV-2 virus and/or diagnosis of COVID-19 infection under section 564(b)(1) of the Act, 21 U.S.C. 485IOE-7(O)(3), unless the authorization is terminated or revoked sooner. When diagnostic testing is negative, the possibility of a false negative result should be considered in the context of a patient's recent exposures and the presence of clinical signs and symptoms consistent with COVID-19. An individual without symptoms of COVID-19 and who is not shedding SARS-CoV-2 virus would expect to have a negative (not detected) result in this assay. Performed  At: [facility] [place], [place] [id number] Rush Farmer MD  XH:[id number]    [place]  Final    Comment: Performed at [facility] [address] [place], [place] [address]  Culture, blood (routine x 2)     Status: None   Collection Time: 05/02/19  7:44 AM   Specimen: BLOOD  Result Value Ref Range Status   Specimen Description   Final    BLOOD LEFT ARM Performed at [facility] [address], [place], [place] [address]    Special Requests   Final    BOTTLES DRAWN AEROBIC AND ANAEROBIC Blood Culture adequate volume Performed at [facility] [address] [place], [place] [address]    Culture   Final    NO GROWTH 5 DAYS  Performed at [facility], [place] [address], [place], [place] [address]    Report Status 05/07/2019 FINAL  Final  Culture, blood (routine x 2)     Status: None   Collection Time: 05/02/19  7:44 AM   Specimen: BLOOD LEFT HAND  Result Value Ref Range Status   Specimen Description   Final    BLOOD LEFT HAND Performed at [facility] [address] [place], [place] [address]    Special Requests   Final    BOTTLES DRAWN AEROBIC AND ANAEROBIC Blood Culture adequate volume Performed at [facility] [address] [place], [place] [address]    Culture   Final    NO GROWTH 5 DAYS Performed at [facility], [place] [address] [place], [place] [address]    Report Status 05/07/2019 FINAL  Final  Culture, blood (Routine X 2) w Reflex to ID Panel     Status: None (Preliminary result)   Collection Time: 05/05/19 10:03 AM   Specimen: BLOOD  Result Value Ref Range Status   Specimen Description   Final    BLOOD LEFT ANTECUBITAL Performed at [facility], [place] [address], [place], [place] [address]    Special Requests   Final    BOTTLES DRAWN AEROBIC AND ANAEROBIC Blood Culture adequate volume Performed at [facility] [address], [place], [place] [address]     Culture   Final    NO GROWTH 2 DAYS Performed at [facility] [address], [place], [place] [address]    Report Status PENDING  Incomplete  Culture, blood (Routine X 2) w Reflex to ID Panel     Status: None (Preliminary result)   Collection Time: 05/05/19 10:12 AM   Specimen: BLOOD LEFT HAND  Result Value Ref Range Status   Specimen Description   Final    BLOOD LEFT HAND Performed at [facility] [address], [place], [place] [address]    Special Requests   Final    BOTTLES DRAWN AEROBIC ONLY Blood Culture adequate volume Performed at [facility] [address], [place], [place] [address]    Culture   Final    NO GROWTH 2 DAYS Performed at [facility] [address], [place], [place] [address]    Report Status PENDING  Incomplete  Body fluid culture     Status: None (Preliminary result)   Collection Time: 05/06/19  1:58 PM   Specimen: Pleura; Body Fluid  Result Value Ref Range Status   Specimen Description   Final    PLEURAL Performed at [facility] [address] [place], [place] [address]    Special Requests   Final    NONE Performed at [facility], [place] [address] [place], [place] [address]    Gram Stain   Final    ABUNDANT WBC PRESENT,BOTH PMN AND MONONUCLEAR NO ORGANISMS SEEN    Culture   Final    NO GROWTH < 24 HOURS Performed at [facility] [address], [place], [place] [address]    Report Status PENDING  Incomplete      Studies: No results found.  Scheduled Meds: . docusate sodium  200 mg Oral BID  . enoxaparin (LOVENOX) injection  40 mg Subcutaneous Daily  . furosemide  20 mg Intravenous Once  . metoprolol tartrate  50 mg Oral BID  . morphine  30  mg Oral Q12H  . polyethylene glycol  17 g Oral BID  . senna-docusate  2 tablet Oral BID    Continuous Infusions: . sodium chloride 10 mL/hr at 05/02/19 1656  . sodium chloride 75 mL/hr at  05/06/19 1500  . piperacillin-tazobactam (ZOSYN)  IV 3.375 g (05/07/19 0944)  . vancomycin 750 mg (05/07/19 0945)     LOS: 14 days     Annita Brod, MD [facility]  To reach me or the doctor on call, go to: [email or web address] Password [facility]  05/07/2019, 12:52 PM

## 2019-05-08 DIAGNOSIS — Z95828 Presence of other vascular implants and grafts: Secondary | ICD-10-CM

## 2019-05-08 LAB — URINALYSIS, ROUTINE W REFLEX MICROSCOPIC
Bilirubin Urine: NEGATIVE
Glucose, UA: NEGATIVE mg/dL
Hgb urine dipstick: NEGATIVE
Ketones, ur: NEGATIVE mg/dL
Leukocytes,Ua: NEGATIVE
Nitrite: NEGATIVE
Protein, ur: NEGATIVE mg/dL
Specific Gravity, Urine: 1.006 (ref 1.005–1.030)
pH: 7 (ref 5.0–8.0)

## 2019-05-08 LAB — CBC WITH DIFFERENTIAL/PLATELET
Abs Immature Granulocytes: 0.05 10*3/uL (ref 0.00–0.07)
Basophils Absolute: 0 10*3/uL (ref 0.0–0.1)
Basophils Relative: 0 %
Eosinophils Absolute: 0.1 10*3/uL (ref 0.0–0.5)
Eosinophils Relative: 2 %
HCT: 31.2 % — ABNORMAL LOW (ref 36.0–46.0)
Hemoglobin: 9.3 g/dL — ABNORMAL LOW (ref 12.0–15.0)
Immature Granulocytes: 1 %
Lymphocytes Relative: 18 %
Lymphs Abs: 1.5 10*3/uL (ref 0.7–4.0)
MCH: 24.7 pg — ABNORMAL LOW (ref 26.0–34.0)
MCHC: 29.8 g/dL — ABNORMAL LOW (ref 30.0–36.0)
MCV: 82.8 fL (ref 80.0–100.0)
Monocytes Absolute: 1.5 10*3/uL — ABNORMAL HIGH (ref 0.1–1.0)
Monocytes Relative: 17 %
Neutro Abs: 5.3 10*3/uL (ref 1.7–7.7)
Neutrophils Relative %: 62 %
Platelets: 445 10*3/uL — ABNORMAL HIGH (ref 150–400)
RBC: 3.77 MIL/uL — ABNORMAL LOW (ref 3.87–5.11)
RDW: 19.8 % — ABNORMAL HIGH (ref 11.5–15.5)
WBC: 8.5 10*3/uL (ref 4.0–10.5)
nRBC: 0.2 % (ref 0.0–0.2)

## 2019-05-08 LAB — BASIC METABOLIC PANEL
Anion gap: 14 (ref 5–15)
BUN: 5 mg/dL — ABNORMAL LOW (ref 6–20)
CO2: 21 mmol/L — ABNORMAL LOW (ref 22–32)
Calcium: 6.6 mg/dL — ABNORMAL LOW (ref 8.9–10.3)
Chloride: 109 mmol/L (ref 98–111)
Creatinine, Ser: 0.65 mg/dL (ref 0.44–1.00)
GFR calc Af Amer: >60 mL/min (ref >60)
GFR calc non Af Amer: >60 mL/min (ref >60)
Glucose, Bld: 131 mg/dL — ABNORMAL HIGH (ref 70–99)
Potassium: 3.1 mmol/L — ABNORMAL LOW (ref 3.5–5.1)
Sodium: 144 mmol/L (ref 135–145)

## 2019-05-08 LAB — PATHOLOGIST SMEAR REVIEW

## 2019-05-08 MED ORDER — BISACODYL 5 MG PO TBEC
5.0000 mg | DELAYED_RELEASE_TABLET | Freq: Once | ORAL | Status: AC
  Administered 2019-05-08: 5 mg via ORAL
  Filled 2019-05-08: qty 1

## 2019-05-08 MED ORDER — K PHOS MONO-SOD PHOS DI & MONO 155-852-130 MG PO TABS
500.0000 mg | ORAL_TABLET | Freq: Two times a day (BID) | ORAL | Status: AC
  Administered 2019-05-08 – 2019-05-09 (×2): 500 mg via ORAL
  Filled 2019-05-08 (×2): qty 2

## 2019-05-08 MED ORDER — POTASSIUM CHLORIDE CRYS ER 20 MEQ PO TBCR
40.0000 meq | EXTENDED_RELEASE_TABLET | Freq: Once | ORAL | Status: AC
  Administered 2019-05-08: 40 meq via ORAL
  Filled 2019-05-08: qty 2

## 2019-05-08 NOTE — Progress Notes (Addendum)
Brittany Rodgers   DOB:[birth year]   LZ#:[id number]   TKW#:[id number]  Subjective:  Brittany Rodgers continues to have intermittent dyspnea.  Pleurx catheter is being drained daily.  She is somewhat more drowsy this morning.  Pain controlled.  Did not move his bowels yesterday but she did not take her laxatives.  Objective: young [place] American woman who appears comfortable in bed. Vitals:   05/08/19 0451 05/08/19 1345  BP: (!) 162/99 (!) 129/94  Pulse: (!) 115 (!) 115  Resp: 19 20  Temp: 98.7 F (37.1 C) 99.1 F (37.3 C)  SpO2: 99% 98%    Body mass index is 32.06 kg/m.  Intake/Output Summary (Last 24 hours) at 05/08/2019 1402 Last data filed at 05/08/2019 1256 Gross per 24 hour  Intake 480 ml  Output 550 ml  Net -70 ml   General: Awake and alert, comfortable.  Appears [place] of breath at times Skin: No rashes HEENT: No thrush or mucositis Lungs: Diminished breath sounds  Cardiovascular: Tachycardic, no murmur.  No lower extremity edema. Abdomen: Positive bowel sounds, soft, nontender Neuro: Alert and oriented x3   CBG (last 3)  No results for input(s): GLUCAP in the last 72 hours.   Labs:  Lab Results  Component Value Date   WBC 8.5 05/08/2019   HGB 9.3 (L) 05/08/2019   HCT 31.2 (L) 05/08/2019   MCV 82.8 05/08/2019   PLT 445 (H) 05/08/2019   NEUTROABS 5.3 05/08/2019    @LASTCHEMISTRY @  Urine Studies No results for input(s): UHGB, CRYS in the last 72 hours.  Invalid input(s): UACOL, UAPR, USPG, UPH, UTP, UGL, [place], UBIL, UNIT, UROB, [place], UEPI, UWBC, Junie Panning [place], [place], [place]  Basic Metabolic Panel: Recent Labs  Lab 05/02/19 0345 05/03/19 0349 05/04/19 0346 05/05/19 0416 05/06/19 0426 05/07/19 0600 05/08/19 0339  NA 140 138 142 136 140 140 144  K 3.6 3.3* 3.6 3.6 3.5 3.1* 3.1*  CL 106 107 112* 105 105 105 109  CO2 23 23 24 24 24 25  21*  GLUCOSE 113* 113* 117* 112* 112* 119* 131*  BUN 8 14 13 6 6  <5* 5*  CREATININE 0.73 0.92 0.50 0.45 0.56 0.54 0.65   CALCIUM 9.5 8.1* 7.4* 6.9* 6.7* 6.7* 6.6*  MG 1.6* 1.7  --  1.7  --   --   --   PHOS  --   --   --  1.3* 1.2*  --   --    GFR Estimated Creatinine Clearance: 83.1 mL/min (by C-G formula based on SCr of 0.65 mg/dL). Liver Function Tests: Recent Labs  Lab 05/03/19 0349 05/06/19 0426  AST 17 13*  ALT 17 18  ALKPHOS 78 118  BILITOT 0.4 0.4  PROT 5.4* 6.1*  ALBUMIN 2.4* 2.3*   No results for input(s): LIPASE, AMYLASE in the last 168 hours. No results for input(s): AMMONIA in the last 168 hours. Coagulation profile No results for input(s): INR, PROTIME in the last 168 hours.  CBC: Recent Labs  Lab 05/04/19 0346 05/05/19 0416 05/06/19 0426 05/07/19 0600 05/08/19 0339  WBC 9.8 8.4 6.7 7.7 8.5  NEUTROABS 7.0 6.2 4.5 5.2 5.3  HGB 8.5* 8.9* 8.6* 8.9* 9.3*  HCT 28.6* 30.0* 28.7* 30.7* 31.2*  MCV 84.9 84.7 84.2 83.4 82.8  PLT 302 366 333 388 445*   Cardiac Enzymes: No results for input(s): CKTOTAL, CKMB, CKMBINDEX, TROPONINI in the last 168 hours. BNP: Invalid input(s): POCBNP CBG: No results for input(s): GLUCAP in the last 168 hours. D-Dimer No results for input(s):  DDIMER in the last 72 hours. Hgb A1c No results for input(s): HGBA1C in the last 72 hours. Lipid Profile No results for input(s): CHOL, HDL, LDLCALC, TRIG, CHOLHDL, LDLDIRECT in the last 72 hours. Thyroid function studies No results for input(s): TSH, T4TOTAL, T3FREE, THYROIDAB in the last 72 hours.  Invalid input(s): FREET3 Anemia work up No results for input(s): VITAMINB12, FOLATE, FERRITIN, TIBC, IRON, RETICCTPCT in the last 72 hours. Microbiology Recent Results (from the past 240 hour(s))  Novel Coronavirus, NAA (hospital order; send-out to ref lab)     Status: None   Collection Time: 04/29/19 12:02 PM   Specimen: Nasopharyngeal Swab; Respiratory  Result Value Ref Range Status   SARS-CoV-2, NAA NOT DETECTED NOT DETECTED Final    Comment: (NOTE) This test was developed and its performance  characteristics determined by [employer]. This test has not been FDA cleared or approved. This test has been authorized by FDA under an Emergency Use Authorization (EUA). This test is only authorized for the duration of time the declaration that circumstances exist justifying the authorization of the emergency use of in vitro diagnostic tests for detection of SARS-CoV-2 virus and/or diagnosis of COVID-19 infection under section 564(b)(1) of the Act, 21 U.S.C. 920FEO-7(H)(2), unless the authorization is terminated or revoked sooner. When diagnostic testing is negative, the possibility of a false negative result should be considered in the context of a patient's recent exposures and the presence of clinical signs and symptoms consistent with COVID-19. An individual without symptoms of COVID-19 and who is not shedding SARS-CoV-2 virus would expect to have a negative (not detected) result in this assay. Performed  At: [facility] [place], [place] [id number] Rush Farmer MD QD:[id number]    [place]  Final    Comment: Performed at [facility] [address], [place], [place] [address]  Culture, blood (routine x 2)     Status: None   Collection Time: 05/02/19  7:44 AM   Specimen: BLOOD  Result Value Ref Range Status   Specimen Description   Final    BLOOD LEFT ARM Performed at [facility] [address] [place], [place] [address]    Special Requests   Final    BOTTLES DRAWN AEROBIC AND ANAEROBIC Blood Culture adequate volume Performed at [facility] [address], [place], [place] [address]    Culture   Final    NO GROWTH 5 DAYS Performed at [facility], [place] [address], [place], [place] [address]    Report Status 05/07/2019 FINAL  Final  Culture, blood (routine x 2)     Status: None   Collection Time: 05/02/19  7:44 AM   Specimen: BLOOD LEFT HAND   Result Value Ref Range Status   Specimen Description   Final    BLOOD LEFT HAND Performed at [facility] [address] [place], [place] [address]    Special Requests   Final    BOTTLES DRAWN AEROBIC AND ANAEROBIC Blood Culture adequate volume Performed at [facility] [address], [place], [place] [address]    Culture   Final    NO GROWTH 5 DAYS Performed at [facility], [place] [address] [place],  [address]    Report Status 05/07/2019 FINAL  Final  Culture, blood (Routine X 2) w Reflex to ID Panel     Status: None (Preliminary result)   Collection Time: 05/05/19 10:03 AM  Specimen: BLOOD  Result Value Ref Range Status   Specimen Description   Final    BLOOD LEFT ANTECUBITAL Performed at [facility], [place] [address], [place], [place] [address]    Special Requests   Final    BOTTLES DRAWN AEROBIC AND ANAEROBIC Blood Culture adequate volume Performed at [facility] [address] [place], [place] [address]    Culture   Final    NO GROWTH 3 DAYS Performed at [facility], [place] [address] [place], [place] [address]    Report Status PENDING  Incomplete  Culture, blood (Routine X 2) w Reflex to ID Panel     Status: None (Preliminary result)   Collection Time: 05/05/19 10:12 AM   Specimen: BLOOD LEFT HAND  Result Value Ref Range Status   Specimen Description   Final    BLOOD LEFT HAND Performed at [facility] [address], [place], [place] [address]    Special Requests   Final    BOTTLES DRAWN AEROBIC ONLY Blood Culture adequate volume Performed at [facility] [address] [place], [place] [address]    Culture   Final    NO GROWTH 3 DAYS Performed at [facility], [place] [address], [place], [place] [address]    Report Status PENDING  Incomplete  Body fluid culture     Status: None (Preliminary result)   Collection Time: 05/06/19   1:58 PM   Specimen: Pleura; Body Fluid  Result Value Ref Range Status   Specimen Description   Final    PLEURAL Performed at [facility] [address], [place], [place] [address]    Special Requests   Final    NONE Performed at [facility], [place] [address] [place], [place] [address]    Gram Stain   Final    ABUNDANT WBC PRESENT,BOTH PMN AND MONONUCLEAR NO ORGANISMS SEEN    Culture   Final    NO GROWTH 2 DAYS Performed at [facility], [place] [address], [place],  [address]    Report Status PENDING  Incomplete      Studies:  No results found.  Assessment: 48 y.o. Fernand Parkins,  woman status post right breast lower outer quadrant biopsy 10/29/2018 for a clinical T2 N0, stage IIB invasive ductal carcinoma, grade 3, triple negative, with an MIB-1 of 80%             (a) CT of the chest 11/15/2018 shows possible liver metastases, but liver biopsy 11/25/2018 consistent with hemangioma             (b) bone scan 11/15/2018 shows no suspicious bone lesions  (1) neoadjuvant chemotherapy consisting of doxorubicin and cyclophosphamide in dose dense fashion x4, started 11/26/2018, completed 01/07/2019, followed by weekly paclitaxel and carboplatin x12 starting 01/28/2019  (2) patient desires ovarian function preservation:              (a) Zoladex started 11/08/2018, discontinued 02/04/2019             (b) patient is status post bilateral tubal ligation  (3) status post right mastectomy with sentinel lymph node sampling 03/04/2019 for a ypTac ypN1a invasive ductal carcinoma grade 3, repeat prognostic panel again triple negative             (a) a total of 6 axillary lymph nodes removed, 2 sentinel ones, 1 positive  (4) adjuvant radiation to be considered  (5)  genetics counseling on 11/28/2018, testing was recommended, but declined by patient.  METASTATIC DISEASE: July 2020 (6) Staging studies:             (a) chest CT scan and  bone scan 11/15/2018 show a liver mass, no lung or bone lesions             (b) liver biopsy 11/25/2018 shows a hemangioma             (c) chest CT Angio 03/26/2019 again shows liver mass, right breast "seroma"  (d) cytology from R thoracentesis 04/12/2019 nondiagnostic  (e) CT angio 04/23/2019 shows recurrent right effusion, multiple bilateral pulmonary nodules, invasive anterior right chest wall lesion  (7) pembrolizumab started 04/02/2019, to be repeated every 21 days  (8) CT angio 04/23/2019 shows new lung nodules, likely metastatic  (9) repeat thoracentesis 04/24/2019-- cytology positive  (10) biopsy R chest wall mass 04/24/2019 confirms recurrent/metastatic breast cancer  (a) CARIS study results pending  (11) to start carboplatin and gemcitabine when stable, to be repeated days 1 and 8 of each 21-day cycle    Plan:  [name]'s dyspnea is improving overall.  Pleurx catheter is being drained daily.  Recommend home health referral for Pleurx catheter supplies and nursing assistance with draining her Pleurx catheter.  Fevers improved.  Cultures have been negative to date.  We will conclude that this is tumor fever and will need to proceed with treatment for her underlying breast cancer as an outpatient.  Constipation seems to be improved with current bowel regimen and recommend that she continue this.  Her pain is controlled on her current pain medication regimen.  We will plan to start the patient on carboplatin and gemcitabine.  This will be given on day 1 and day 8 of each 21-day cycle.  She will also continue on pembrolizumab.  She is scheduled for chemotherapy on 05/03/2019 and she will be seen for follow-up on that date.  Okay to discharge the patient from medical oncology standpoint once medically stable.   Mikey Bussing, NP 05/08/2019  2:02 PM Medical Oncology and Hematology [facility] [address] [place], [place] [address] Tel. [phone]    Fax.  [phone]    ADDENDUM: Brittany Rodgers is generally better and I am very appreciative of the staff''s efforts on her behalf. As with many young people, it is difficult for her to grasp that she has chronic problems that are not going to go away and that require chronic ongoing treatment. She needs to take her medications and especially her bowel prophylaxis medications as prescribed. She will need to drain her pleurx at home every 48-72 hours (initially of course with HHN help). She is doing better on pain medications but occasionally she does become somewhat confused and this works against simple compliance.   She is scheduled for a visit and chemotherapy 05/13/2019 and I will see her that day.  Please let me know if we can be of further help at this point  Chauncey Cruel, MD Medical Oncology and Hematology [facility] [place], [place] [address] Tel. [phone]    Fax. [phone]

## 2019-05-08 NOTE — Progress Notes (Signed)
PROGRESS NOTE    Brittany Rodgers  SHF:[id number] DOB: 1971-07-05 DOA: 04/23/2019 PCP: [name], MD   Brief Narrative: 48 year old female with past medical history significant for metastatic breast cancer invasive ductal carcinoma status post right mastectomy who presented to the ED on 7/29 2020 with complaints of increased shortness of breath and lethargy.  She had been seen on the day prior in the oncology clinic and was noted to have a new chest wall mass as well as liver mass.  Patient was being considered for possible biopsy as an outpatient.  Next day patient became increasingly Defeo of breath and tachycardic.  She was direct to the ED for inpatient admission and management. In the ED patient was hypoxic and tachycardic up to 140 with a CT scan of the chest on admission showing findings worrisome for large right side pleural effusion.  Patient was admitted under hospitalist service for further evaluation and management.  Oncology and IR consult was obtained.  On 7/30, patient underwent thoracentesis and biopsy of chest wall mass.  Fluid cytology and biopsy of the mass positive for metastatic disease.  On 8/6 patient underwent Pleurx catheter placement on the right side.  I patient also develop intermittent shortness of breath and fever.  She was evaluated by infectious disease who agreed with covering with broad-spectrum antibiotic and awaiting culture results.     Assessment & Plan:   Principal Problem:   Pleural effusion Active Problems:   IBS   Hyperlipidemia   Chronic right hip pain   Malignant neoplasm of lower-outer quadrant of right breast of female, estrogen receptor negative (HCC)   S/P mastectomy   Pleural effusion on right   1-SIRS with acute metabolic encephalopathy: Sepsis was ruled out. Patient on admission was a started on broad-spectrum antibiotics.  No source identified.  Antibiotics were stopped on 8/6.  Patient continued to spike intermittent high  fever.  However white blood cell, lactic acid and procalcitonin level were trending down. On 8/8 patient was having intermittent fever she was getting more tachycardic and more drowsy.  A T-max of 103.  Infectious was work-up negative.  Seen by ID who suspected that cancer could be calcium fever.  Recommending continue antibiotics until pleural cultures available. I spoke with Dr. Graylon Good today review pleural fluid culture results and white count. Plan is to discontinue antibiotics and observe.  Metastatic  breast cancer invasive ductal carcinoma, status post right mastectomy.  Recent diagnosis of metastasis to the chest wall and right pleural effusion. Patient followed by [name]. Patient had a Pleurx catheter placed on 8/6.  Apparently there was no order to drain the catheter.  Patient went 4 days without draining.  Order in place for drain Pleurx catheter every other day. Patient to be started on carboplatin Gemcitabine  possible next week  Right axillary pain: At the site of metastasis to the anterior chest wall and pectoralis muscle. Continue with MS Contin  Sinus tachycardia: Improved for metoprolol. Constipation; continue with stool softener and MiraLAX Hyperliipidemia: Statin discontinued due to liver mets Varicose EMEA: Likely related to malignancy.  Patient received pamidronate as an outpatient.  Calcium level now low, stable follow.  Corrected calcium for albumin 7.6  Hypokalemia: We will order oral calcium supplement. Hypophosphatemia repeat level in the morning Estimated body mass index is 32.06 kg/m as calculated from the following:   Height as of this encounter: 5\' 2"  (1.575 m).   Weight as of this encounter: 79.5 kg.   DVT prophylaxis:  Code Status: Full code Family Communication: Care discussed with patient Disposition Plan: Home in 24-hour  Consultants:   Oncology  Interventional radiology  Infectious disease  Procedures:  7/30-thoracentesis and biopsy  of chest wall mass.  8/6-Pleurx catheter placement on right side.    Antimicrobials:  Vancomycin Zosyn 8-10---813  Subjective: No bowel movement in the last 2 days.  She denies worsening shortness of breath.  She is going to try to eat some today.  Objective: Vitals:   05/07/19 2225 05/08/19 0058 05/08/19 0451 05/08/19 1345  BP: (!) 170/102 (!) 161/98 (!) 162/99 (!) 129/94  Pulse: (!) 125 (!) 115 (!) 115 (!) 115  Resp: 20  19 20   Temp: 99.3 F (37.4 C) 99 F (37.2 C) 98.7 F (37.1 C) 99.1 F (37.3 C)  TempSrc: Oral Oral Oral Oral  SpO2: 99%  99% 98%  Weight:      Height:        Intake/Output Summary (Last 24 hours) at 05/08/2019 1435 Last data filed at 05/08/2019 1256 Gross per 24 hour  Intake 480 ml  Output 550 ml  Net -70 ml   Filed Weights   04/24/19 0003 05/07/19 1200  Weight: 78.5 kg 79.5 kg    Examination:  General exam: Appears calm and comfortable  Respiratory system: Clear to auscultation. Respiratory effort normal. Cardiovascular system: S1 & S2 heard, RRR. No JVD, murmurs, rubs, gallops or clicks. No pedal edema. Gastrointestinal system: Abdomen is nondistended, soft and nontender. No organomegaly or masses felt. Normal bowel sounds heard. Central nervous system: Alert and oriented. No focal neurological deficits. Extremities: Symmetric 5 x 5 power. Skin: No rashes, lesions or ulcers Psychiatry: Judgement and insight appear normal. Mood & affect appropriate.     Data Reviewed: I have personally reviewed following labs and imaging studies  CBC: Recent Labs  Lab 05/04/19 0346 05/05/19 0416 05/06/19 0426 05/07/19 0600 05/08/19 0339  WBC 9.8 8.4 6.7 7.7 8.5  NEUTROABS 7.0 6.2 4.5 5.2 5.3  HGB 8.5* 8.9* 8.6* 8.9* 9.3*  HCT 28.6* 30.0* 28.7* 30.7* 31.2*  MCV 84.9 84.7 84.2 83.4 82.8  PLT 302 366 333 388 235*   Basic Metabolic Panel: Recent Labs  Lab 05/02/19 0345 05/03/19 0349 05/04/19 0346 05/05/19 0416 05/06/19 0426 05/07/19 0600  05/08/19 0339  NA 140 138 142 136 140 140 144  K 3.6 3.3* 3.6 3.6 3.5 3.1* 3.1*  CL 106 107 112* 105 105 105 109  CO2 23 23 24 24 24 25  21*  GLUCOSE 113* 113* 117* 112* 112* 119* 131*  BUN 8 14 13 6 6  <5* 5*  CREATININE 0.73 0.92 0.50 0.45 0.56 0.54 0.65  CALCIUM 9.5 8.1* 7.4* 6.9* 6.7* 6.7* 6.6*  MG 1.6* 1.7  --  1.7  --   --   --   PHOS  --   --   --  1.3* 1.2*  --   --    GFR: Estimated Creatinine Clearance: 83.1 mL/min (by C-G formula based on SCr of 0.65 mg/dL). Liver Function Tests: Recent Labs  Lab 05/03/19 0349 05/06/19 0426  AST 17 13*  ALT 17 18  ALKPHOS 78 118  BILITOT 0.4 0.4  PROT 5.4* 6.1*  ALBUMIN 2.4* 2.3*   No results for input(s): LIPASE, AMYLASE in the last 168 hours. No results for input(s): AMMONIA in the last 168 hours. Coagulation Profile: No results for input(s): INR, PROTIME in the last 168 hours. Cardiac Enzymes: No results for input(s): CKTOTAL, CKMB, CKMBINDEX, TROPONINI in the  last 168 hours. BNP (last 3 results) No results for input(s): PROBNP in the last 8760 hours. HbA1C: No results for input(s): HGBA1C in the last 72 hours. CBG: No results for input(s): GLUCAP in the last 168 hours. Lipid Profile: No results for input(s): CHOL, HDL, LDLCALC, TRIG, CHOLHDL, LDLDIRECT in the last 72 hours. Thyroid Function Tests: No results for input(s): TSH, T4TOTAL, FREET4, T3FREE, THYROIDAB in the last 72 hours. Anemia Panel: No results for input(s): VITAMINB12, FOLATE, FERRITIN, TIBC, IRON, RETICCTPCT in the last 72 hours. Sepsis Labs: Recent Labs  Lab 05/02/19 1402 05/03/19 0349 05/04/19 0346 05/06/19 0426  PROCALCITON 0.28 0.63 0.46  --   LATICACIDVEN 2.3*  --  1.7 1.9    Recent Results (from the past 240 hour(s))  Novel Coronavirus, NAA (hospital order; send-out to ref lab)     Status: None   Collection Time: 04/29/19 12:02 PM   Specimen: Nasopharyngeal Swab; Respiratory  Result Value Ref Range Status   SARS-CoV-2, NAA NOT DETECTED NOT  DETECTED Final    Comment: (NOTE) This test was developed and its performance characteristics determined by [employer]. This test has not been FDA cleared or approved. This test has been authorized by FDA under an Emergency Use Authorization (EUA). This test is only authorized for the duration of time the declaration that circumstances exist justifying the authorization of the emergency use of in vitro diagnostic tests for detection of SARS-CoV-2 virus and/or diagnosis of COVID-19 infection under section 564(b)(1) of the Act, 21 U.S.C. 163WGY-6(Z)(9), unless the authorization is terminated or revoked sooner. When diagnostic testing is negative, the possibility of a false negative result should be considered in the context of a patient's recent exposures and the presence of clinical signs and symptoms consistent with COVID-19. An individual without symptoms of COVID-19 and who is not shedding SARS-CoV-2 virus would expect to have a negative (not detected) result in this assay. Performed  At: [facility] [place], [place] [id number] Rush Farmer MD TJ:[id number]    [place]  Final    Comment: Performed at [facility] [address], [place], [place] [address]  Culture, blood (routine x 2)     Status: None   Collection Time: 05/02/19  7:44 AM   Specimen: BLOOD  Result Value Ref Range Status   Specimen Description   Final    BLOOD LEFT ARM Performed at [facility] [address], [place], [place] [address]    Special Requests   Final    BOTTLES DRAWN AEROBIC AND ANAEROBIC Blood Culture adequate volume Performed at [facility] [address] [place], [place] [address]    Culture   Final    NO GROWTH 5 DAYS Performed at [facility], [place] [address], [place], [place] [address]    Report Status 05/07/2019 FINAL  Final  Culture, blood (routine x 2)      Status: None   Collection Time: 05/02/19  7:44 AM   Specimen: BLOOD LEFT HAND  Result Value Ref Range Status   Specimen Description   Final    BLOOD LEFT HAND Performed at [facility] [address] [place], [place] [address]    Special Requests   Final    BOTTLES DRAWN AEROBIC AND ANAEROBIC Blood Culture adequate volume Performed at [facility] [address] [place], [place] [address]    Culture   Final    NO GROWTH 5 DAYS Performed  at [facility], [place] [address] [place], [place] [address]    Report Status 05/07/2019 FINAL  Final  Culture, blood (Routine X 2) w Reflex to ID Panel     Status: None (Preliminary result)   Collection Time: 05/05/19 10:03 AM   Specimen: BLOOD  Result Value Ref Range Status   Specimen Description   Final    BLOOD LEFT ANTECUBITAL Performed at [facility], [place] [address], [place], [place] [address]    Special Requests   Final    BOTTLES DRAWN AEROBIC AND ANAEROBIC Blood Culture adequate volume Performed at [facility] [address] [place], [place] [address]    Culture   Final    NO GROWTH 3 DAYS Performed at [facility], [place] [address], [place], [place] [address]    Report Status PENDING  Incomplete  Culture, blood (Routine X 2) w Reflex to ID Panel     Status: None (Preliminary result)   Collection Time: 05/05/19 10:12 AM   Specimen: BLOOD LEFT HAND  Result Value Ref Range Status   Specimen Description   Final    BLOOD LEFT HAND Performed at [facility] [address] [place], [place] [address]    Special Requests   Final    BOTTLES DRAWN AEROBIC ONLY Blood Culture adequate volume Performed at [place] [address] [place], [place] [address]    Culture   Final    NO GROWTH 3 DAYS Performed at [facility], [place] [address] [place], [place] [address]    Report Status PENDING  Incomplete  Body  fluid culture     Status: None (Preliminary result)   Collection Time: 05/06/19  1:58 PM   Specimen: Pleura; Body Fluid  Result Value Ref Range Status   Specimen Description   Final    PLEURAL Performed at [facility] [address] [place], [place] [address]    Special Requests   Final    NONE Performed at [facility], [place] [address], [place], [place] [address]    Gram Stain   Final    ABUNDANT WBC PRESENT,BOTH PMN AND MONONUCLEAR NO ORGANISMS SEEN    Culture   Final    NO GROWTH 2 DAYS Performed at [facility], [place] [address] [place], [place] [address]    Report Status PENDING  Incomplete         Radiology Studies: No results found.      Scheduled Meds: . bisacodyl  5 mg Oral Once  . docusate sodium  200 mg Oral BID  . enoxaparin (LOVENOX) injection  40 mg Subcutaneous Daily  . furosemide  20 mg Intravenous Once  . metoprolol tartrate  50 mg Oral BID  . morphine  30 mg Oral Q12H  . polyethylene glycol  17 g Oral BID  . senna-docusate  2 tablet Oral BID   Continuous Infusions: . sodium chloride 10 mL/hr at 05/02/19 1656  . sodium chloride 75 mL/hr at 05/08/19 1135     LOS: 15 days    Time spent: 35 minutes.     Elmarie Shiley, MD [facility] Pager [phone]  If 7PM-7AM, please contact night-coverage [email or web address] Password [facility] 05/08/2019, 2:35 PM

## 2019-05-08 NOTE — Progress Notes (Signed)
Pharmacy Antibiotic Note  Brittany Rodgers is a 48 y.o. female with a history of metastatic breast cancer s/p mastectomy, admitted on 04/23/19 with pleural effusion. Pt completed 2 days of vancomycin and 7 days of cefepime for concern of infection/sepsis s/p thoracentesis. Urine cultures on 7/30 showed many species present. Pt spiked a fever this morning, MD concerned for sepsis. Blood cultures negative. Pharmacy has been consulted for vancomycin and Zosyn dosing.  Today, 05/05/19:  D4 abx resumed  SCr stable at baseline  Afebrile since resuming Abx  WBC stable WNL  Plan:  Continue vancomycin 750 mg q12h (AUC of 488 w/ Scr 0.8 and Vd 0.65)  Continue Zosyn 3.375 mg q8h   Plan was to narrow/stop abx soon if Cx remain clear; f/u LOT  Height: 5\' 2"  (157.5 cm) Weight: 175 lb 4.3 oz (79.5 kg) IBW/kg (Calculated) : 50.1  Temp (24hrs), Avg:99 F (37.2 C), Min:98.7 F (37.1 C), Max:99.8 F (37.7 C)  Recent Labs  Lab 05/02/19 1402  05/04/19 0346 05/05/19 0416 05/06/19 0426 05/07/19 0600 05/08/19 0339  WBC  --    < > 9.8 8.4 6.7 7.7 8.5  CREATININE  --    < > 0.50 0.45 0.56 0.54 0.65  LATICACIDVEN 2.3*  --  1.7  --  1.9  --   --    < > = values in this interval not displayed.    Estimated Creatinine Clearance: 83.1 mL/min (by C-G formula based on SCr of 0.65 mg/dL).    Allergies  Allergen Reactions  . Barbiturates Other (See Comments)    Keeps pt awake  . Methocarbamol Itching  . Sulfonamide Derivatives Itching    Antimicrobials this admission: 7/30 Vancomycin >> 7/31 7/30 Cefepime >> 8/5 8/10 Vancomycin >> 8/10 Zosyn >>  Microbiology results: 8/4 Covid send-out: sent  7/30 Pleural fluid AFB/Cx: neg/sent 7/30 pleural fluid cx: ng-final 7/30 BCx:ng-final 7/30UCx: mulp sp, final 7/29 COVID: neg 7/20 MRSA PCR neg  Thank you for allowing pharmacy to be a part of this patient's care.  Reuel Boom, PharmD, BCPS [phone] 05/08/2019, 12:05 PM

## 2019-05-08 NOTE — Progress Notes (Signed)
Pleurex drain performed.  Patient tolerated the procedure well.  Dressing was clean dry when removed.  Removed 400 cc of serosanguinous fluid.  Patient teaching done during procedure.  Patient now resting comfortably.  No c/o of pain or distress at this time.  Will continue to monitor.  Virginia Rochester, RN

## 2019-05-08 NOTE — Progress Notes (Signed)
    [facility] for Infectious Disease    Date of Admission:  04/23/2019   Total days of antibiotics 4   ID: Brittany Rodgers is a 48 y.o. female with  metastastic breast ca and malignant recurrent pleural effusion Principal Problem:   Pleural effusion Active Problems:   IBS   Hyperlipidemia   Chronic right hip pain   Malignant neoplasm of lower-outer quadrant of right breast of female, estrogen receptor negative (HCC)   S/P mastectomy   Pleural effusion on right    Subjective: Afebrile, had 44mL drained from pleurex catheter. no positive cultures Medications:  . docusate sodium  200 mg Oral BID  . enoxaparin (LOVENOX) injection  40 mg Subcutaneous Daily  . furosemide  20 mg Intravenous Once  . metoprolol tartrate  50 mg Oral BID  . morphine  30 mg Oral Q12H  . phosphorus  500 mg Oral BID  . polyethylene glycol  17 g Oral BID  . senna-docusate  2 tablet Oral BID    Objective: Vital signs in last 24 hours: Temp:  [98.7 F (37.1 C)-99.8 F (37.7 C)] 99.1 F (37.3 C) (08/13 1345) Pulse Rate:  [115-128] 115 (08/13 1345) Resp:  [19-22] 20 (08/13 1345) BP: (129-170)/(94-106) 129/94 (08/13 1345) SpO2:  [94 %-99 %] 98 % (08/13 1345) Physical Exam  Constitutional:  oriented to person, sleeping. appears well-developed and well-nourished. No distress.  HENT: [place]/AT, PERRLA, no scleral icterus Mouth/Throat: Oropharynx is clear and moist. No oropharyngeal exudate.  Cardiovascular: Normal rate, regular rhythm and normal heart sounds. Exam reveals no gallop and no friction rub.  No murmur heard.  Chest wall: portacath in place Pulmonary/Chest: Effort normal and breath sounds are decreased on right. Neck = supple, no nuchal rigidity Abdominal: Soft. Bowel sounds are normal.  exhibits no distension. There is no tenderness.  Lymphadenopathy: no cervical adenopathy. No axillary adenopathy Neurological: alert and oriented to person, place, and time.  Skin: Skin is warm and dry.  No rash noted. No erythema.  Psychiatric: a normal mood and affect.  behavior is normal.    Lab Results Recent Labs    05/07/19 0600 05/08/19 0339  WBC 7.7 8.5  HGB 8.9* 9.3*  HCT 30.7* 31.2*  NA 140 144  K 3.1* 3.1*  CL 105 109  CO2 25 21*  BUN <5* 5*  CREATININE 0.54 0.65   Liver Panel Recent Labs    05/06/19 0426  PROT 6.1*  ALBUMIN 2.3*  AST 13*  ALT 18  ALKPHOS 118  BILITOT 0.4  BILIDIR 0.1  IBILI 0.3    Microbiology: reviewed Studies/Results: No results found.   Assessment/Plan: Fever of unknown origin = suspect that it is malignancy related. No positive cultures. Can stop abtx and observe off of therapy. Patient is anticipated to start carbo/gemcitabine regimen soon  Recurrent malignant effusion = currently draining daily to QOD for symptomatic relief  Will sign off  [facility] Cell: [phone] Pager: [phone]  05/08/2019, 4:47 PM

## 2019-05-09 DIAGNOSIS — R509 Fever, unspecified: Secondary | ICD-10-CM

## 2019-05-09 LAB — BODY FLUID CULTURE: Culture: NO GROWTH

## 2019-05-09 LAB — BASIC METABOLIC PANEL
Anion gap: 11 (ref 5–15)
Anion gap: 13 (ref 5–15)
BUN: 6 mg/dL (ref 6–20)
BUN: 6 mg/dL (ref 6–20)
CO2: 21 mmol/L — ABNORMAL LOW (ref 22–32)
CO2: 21 mmol/L — ABNORMAL LOW (ref 22–32)
Calcium: 6.6 mg/dL — ABNORMAL LOW (ref 8.9–10.3)
Calcium: 6.5 mg/dL — ABNORMAL LOW (ref 8.9–10.3)
Chloride: 114 mmol/L — ABNORMAL HIGH (ref 98–111)
Chloride: 109 mmol/L (ref 98–111)
Creatinine, Ser: 0.75 mg/dL (ref 0.44–1.00)
Creatinine, Ser: 0.73 mg/dL (ref 0.44–1.00)
GFR calc Af Amer: >60 mL/min (ref >60)
GFR calc Af Amer: >60 mL/min (ref >60)
GFR calc non Af Amer: >60 mL/min (ref >60)
GFR calc non Af Amer: >60 mL/min (ref >60)
Glucose, Bld: 117 mg/dL — ABNORMAL HIGH (ref 70–99)
Glucose, Bld: 150 mg/dL — ABNORMAL HIGH (ref 70–99)
Potassium: 3.2 mmol/L — ABNORMAL LOW (ref 3.5–5.1)
Potassium: 3.3 mmol/L — ABNORMAL LOW (ref 3.5–5.1)
Sodium: 146 mmol/L — ABNORMAL HIGH (ref 135–145)
Sodium: 143 mmol/L (ref 135–145)

## 2019-05-09 LAB — PHOSPHORUS
Phosphorus: 1.7 mg/dL — ABNORMAL LOW (ref 2.5–4.6)
Phosphorus: 3.7 mg/dL (ref 2.5–4.6)

## 2019-05-09 MED ORDER — POTASSIUM CHLORIDE CRYS ER 20 MEQ PO TBCR
40.0000 meq | EXTENDED_RELEASE_TABLET | ORAL | Status: AC
  Administered 2019-05-09 (×2): 40 meq via ORAL
  Filled 2019-05-09 (×2): qty 2

## 2019-05-09 MED ORDER — SODIUM PHOSPHATES 45 MMOLE/15ML IV SOLN
20.0000 mmol | Freq: Once | INTRAVENOUS | Status: AC
  Administered 2019-05-09: 20 mmol via INTRAVENOUS
  Filled 2019-05-09: qty 6.67

## 2019-05-09 MED ORDER — POTASSIUM CHLORIDE CRYS ER 20 MEQ PO TBCR
40.0000 meq | EXTENDED_RELEASE_TABLET | Freq: Once | ORAL | Status: AC
  Administered 2019-05-09: 22:00:00 40 meq via ORAL
  Filled 2019-05-09: qty 2

## 2019-05-09 MED ORDER — DEXTROSE 5 % IV SOLN
INTRAVENOUS | Status: DC
  Administered 2019-05-09 – 2019-05-10 (×4): via INTRAVENOUS

## 2019-05-09 NOTE — TOC Progression Note (Signed)
Transition of Care [facility]) - Progression Note    Patient Details  Name: Brittany Rodgers MRN: [id number] Date of Birth: 16-May-1971  Transition of Care [facility]) CM/SW [place], [place] Phone Number: [phone] 05/09/2019, 2:16 PM  Clinical Narrative:   Arranged HH RN for pt ([facility]) due to pleurx drain. Care teaching is being done with pt while in hospital as well. [facility] care can begin 05/14/2019. Currently drain care being done daily.      Barriers to Discharge: Continued Medical Work up, home health needs  Expected Discharge Plan and Services     Discharge Planning Services: CM Consult     Expected Discharge Date: (unknown)                                     Social Determinants of Health (SDOH) Interventions    Readmission Risk Interventions No flowsheet data found.

## 2019-05-09 NOTE — Progress Notes (Signed)
PROGRESS NOTE    Brittany Rodgers  KKX:[id number] DOB: 10/31/1970 DOA: 04/23/2019 PCP: [name], MD   Brief Narrative: 48 year old female with past medical history significant for metastatic breast cancer invasive ductal carcinoma status post right mastectomy who presented to the ED on 7/29 2020 with complaints of increased shortness of breath and lethargy.  She had been seen on the day prior in the oncology clinic and was noted to have a new chest wall mass as well as liver mass.  Patient was being considered for possible biopsy as an outpatient.  Next day patient became increasingly [place] of breath and tachycardic.  She was direct to the ED for inpatient admission and management. In the ED patient was hypoxic and tachycardic up to 140 with a CT scan of the chest on admission showing findings worrisome for large right side pleural effusion.  Patient was admitted under hospitalist service for further evaluation and management.  Oncology and IR consult was obtained.  On 7/30, patient underwent thoracentesis and biopsy of chest wall mass.  Fluid cytology and biopsy of the mass positive for metastatic disease.  On 8/6 patient underwent Pleurx catheter placement on the right side.  I patient also develop intermittent shortness of breath and fever.  She was evaluated by infectious disease who agreed with covering with broad-spectrum antibiotic and awaiting culture results.     Assessment & Plan:   Principal Problem:   Pleural effusion Active Problems:   IBS   Hyperlipidemia   Chronic right hip pain   Malignant neoplasm of lower-outer quadrant of right breast of female, estrogen receptor negative (HCC)   S/P mastectomy   Pleural effusion on right   1-SIRS with acute metabolic encephalopathy: Sepsis was ruled out. Patient on admission was a started on broad-spectrum antibiotics.  No source identified.  Antibiotics were stopped on 8/6.  Patient continued to spike intermittent high  fever.  However white blood cell, lactic acid and procalcitonin level were trending down. On 8/8 patient was having intermittent fever she was getting more tachycardic and more drowsy.  A T-max of 103.  Infectious was work-up negative.  Seen by ID who suspected that cancer could be calcium fever.  Recommending continue antibiotics until pleural cultures available. I spoke with Dr. Graylon Good today review pleural fluid culture results and white count. Plan is to discontinue antibiotics and observe. Afebrile off antibiotics.   Metastatic  breast cancer invasive ductal carcinoma, status post right mastectomy.  Recent diagnosis of metastasis to the chest wall and right pleural effusion. Patient followed by [name]. Patient had a Pleurx catheter placed on 8/6.  Apparently there was no order to drain the catheter.  Patient went 4 days without draining.  Order in place for drain Pleurx catheter every other day. Patient to be started on carboplatin Gemcitabine  possible next week Patient need to learn how to take care of drain. HH will only be able to go to patient house on the 19.  Nurse will teach patient today.   Right axillary pain: At the site of metastasis to the anterior chest wall and pectoralis muscle. Continue with MS Contin  Sinus tachycardia: Improved for metoprolol.  Constipation; continue with stool softener and MiraLAX.  Hyperliipidemia: Statin discontinued due to liver mets Hypocalcemia.  Likely related to malignancy.  Patient received pamidronate as an outpatient.  Calcium level now low, stable follow.  Corrected calcium for albumin 7.6  Hypokalemia: We will order oral calcium supplement.  Hypophosphatemia ; replete IV and  oral.  Hypernatremia; start D 5.    Estimated body mass index is 32.06 kg/m as calculated from the following:   Height as of this encounter: 5\' 2"  (1.575 m).   Weight as of this encounter: 79.5 kg.   DVT prophylaxis:  Code Status: Full code Family  Communication: Care discussed with patient Disposition Plan: Home in 24-hour  Consultants:   Oncology  Interventional radiology  Infectious disease  Procedures:  7/30-thoracentesis and biopsy of chest wall mass.  8/6-Pleurx catheter placement on right side.    Antimicrobials:  Vancomycin Zosyn 8-10---813  Subjective: Sleepy this am. More alert in the afternoon. She is willing to learn how to take care of pleurex cather  Objective: Vitals:   05/08/19 2303 05/08/19 2305 05/09/19 0506 05/09/19 1422  BP:  (!) 146/93 (!) 128/99 132/82  Pulse: (!) 102 (!) 102 96 (!) 102  Resp:  20 20 (!) 24  Temp:  98.7 F (37.1 C) 97.8 F (36.6 C) 97.8 F (36.6 C)  TempSrc:  Oral Oral Oral  SpO2: 98% 98% 97% 93%  Weight:      Height:        Intake/Output Summary (Last 24 hours) at 05/09/2019 1504 Last data filed at 05/09/2019 5809 Gross per 24 hour  Intake 400 ml  Output 1150 ml  Net -750 ml   Filed Weights   04/24/19 0003 05/07/19 1200  Weight: 78.5 kg 79.5 kg    Examination:  General exam: ANAD Respiratory system: CTA Cardiovascular system: S 1, S 2 RRR Gastrointestinal system: BS present, soft, nt Central nervous system: non focal.  Extremities: symmetric power.  Skin: no rashes.    Data Reviewed: I have personally reviewed following labs and imaging studies  CBC: Recent Labs  Lab 05/04/19 0346 05/05/19 0416 05/06/19 0426 05/07/19 0600 05/08/19 0339  WBC 9.8 8.4 6.7 7.7 8.5  NEUTROABS 7.0 6.2 4.5 5.2 5.3  HGB 8.5* 8.9* 8.6* 8.9* 9.3*  HCT 28.6* 30.0* 28.7* 30.7* 31.2*  MCV 84.9 84.7 84.2 83.4 82.8  PLT 302 366 333 388 983*   Basic Metabolic Panel: Recent Labs  Lab 05/03/19 0349  05/05/19 0416 05/06/19 0426 05/07/19 0600 05/08/19 0339 05/09/19 0452 05/09/19 1303  NA 138   < > 136 140 140 144 146* 143  K 3.3*   < > 3.6 3.5 3.1* 3.1* 3.2* 3.3*  CL 107   < > 105 105 105 109 114* 109  CO2 23   < > 24 24 25  21* 21* 21*  GLUCOSE 113*   < > 112*  112* 119* 131* 117* 150*  BUN 14   < > 6 6 <5* 5* 6 6  CREATININE 0.92   < > 0.45 0.56 0.54 0.65 0.75 0.73  CALCIUM 8.1*   < > 6.9* 6.7* 6.7* 6.6* 6.6* 6.5*  MG 1.7  --  1.7  --   --   --   --   --   PHOS  --   --  1.3* 1.2*  --   --  1.7* 3.7   < > = values in this interval not displayed.   GFR: Estimated Creatinine Clearance: 83.1 mL/min (by C-G formula based on SCr of 0.73 mg/dL). Liver Function Tests: Recent Labs  Lab 05/03/19 0349 05/06/19 0426  AST 17 13*  ALT 17 18  ALKPHOS 78 118  BILITOT 0.4 0.4  PROT 5.4* 6.1*  ALBUMIN 2.4* 2.3*   No results for input(s): LIPASE, AMYLASE in the last 168 hours. No  results for input(s): AMMONIA in the last 168 hours. Coagulation Profile: No results for input(s): INR, PROTIME in the last 168 hours. Cardiac Enzymes: No results for input(s): CKTOTAL, CKMB, CKMBINDEX, TROPONINI in the last 168 hours. BNP (last 3 results) No results for input(s): PROBNP in the last 8760 hours. HbA1C: No results for input(s): HGBA1C in the last 72 hours. CBG: No results for input(s): GLUCAP in the last 168 hours. Lipid Profile: No results for input(s): CHOL, HDL, LDLCALC, TRIG, CHOLHDL, LDLDIRECT in the last 72 hours. Thyroid Function Tests: No results for input(s): TSH, T4TOTAL, FREET4, T3FREE, THYROIDAB in the last 72 hours. Anemia Panel: No results for input(s): VITAMINB12, FOLATE, FERRITIN, TIBC, IRON, RETICCTPCT in the last 72 hours. Sepsis Labs: Recent Labs  Lab 05/03/19 0349 05/04/19 0346 05/06/19 0426  PROCALCITON 0.63 0.46  --   LATICACIDVEN  --  1.7 1.9    Recent Results (from the past 240 hour(s))  Culture, blood (routine x 2)     Status: None   Collection Time: 05/02/19  7:44 AM   Specimen: BLOOD  Result Value Ref Range Status   Specimen Description   Final    BLOOD LEFT ARM Performed at [facility] [address] [place], [place] [address]    Special Requests   Final    BOTTLES DRAWN AEROBIC AND  ANAEROBIC Blood Culture adequate volume Performed at [facility] [address] [place], [place] [address]    Culture   Final    NO GROWTH 5 DAYS Performed at [facility], [place] [address], [place], [place] [address]    Report Status 05/07/2019 FINAL  Final  Culture, blood (routine x 2)     Status: None   Collection Time: 05/02/19  7:44 AM   Specimen: BLOOD LEFT HAND  Result Value Ref Range Status   Specimen Description   Final    BLOOD LEFT HAND Performed at [facility] [address], [place], [place] [address]    Special Requests   Final    BOTTLES DRAWN AEROBIC AND ANAEROBIC Blood Culture adequate volume Performed at [facility] [address], [place], [place] [address]    Culture   Final    NO GROWTH 5 DAYS Performed at [facility], [place] [address], [place], [place] [address]    Report Status 05/07/2019 FINAL  Final  Culture, blood (Routine X 2) w Reflex to ID Panel     Status: None (Preliminary result)   Collection Time: 05/05/19 10:03 AM   Specimen: BLOOD  Result Value Ref Range Status   Specimen Description   Final    BLOOD LEFT ANTECUBITAL Performed at [facility], [place] [address] [place], [place] [address]    Special Requests   Final    BOTTLES DRAWN AEROBIC AND ANAEROBIC Blood Culture adequate volume Performed at [facility] [address], [place], [place] [address]    Culture   Final    NO GROWTH 4 DAYS Performed at [facility], [place] [address] [place],  [address]    Report Status PENDING  Incomplete  Culture, blood (Routine X 2) w Reflex to ID Panel     Status: None (Preliminary result)   Collection Time: 05/05/19 10:12 AM   Specimen: BLOOD LEFT HAND  Result Value Ref Range Status   Specimen Description   Final    BLOOD LEFT HAND Performed at [facility],  [place] [address], [place], [place] [address]    Special Requests    Final    BOTTLES DRAWN AEROBIC ONLY Blood Culture adequate volume Performed at [facility] [address], [place], [place] [address]    Culture   Final    NO GROWTH 4 DAYS Performed at [facility], [place] [address], [place], [place] [address]    Report Status PENDING  Incomplete  Body fluid culture     Status: None   Collection Time: 05/06/19  1:58 PM   Specimen: Pleura; Body Fluid  Result Value Ref Range Status   Specimen Description   Final    PLEURAL Performed at [facility] [address], [place], [place] [address]    Special Requests   Final    NONE Performed at [facility], [place] [address], [place], [place] [address]    Gram Stain   Final    ABUNDANT WBC PRESENT,BOTH PMN AND MONONUCLEAR NO ORGANISMS SEEN    Culture   Final    NO GROWTH Performed at [facility], [place] [address] [place], [place] [address]    Report Status 05/09/2019 FINAL  Final         Radiology Studies: No results found.      Scheduled Meds:  docusate sodium  200 mg Oral BID   enoxaparin (LOVENOX) injection  40 mg Subcutaneous Daily   furosemide  20 mg Intravenous Once   metoprolol tartrate  50 mg Oral BID   morphine  30 mg Oral Q12H   polyethylene glycol  17 g Oral BID   potassium chloride  40 mEq Oral Q4H   potassium chloride  40 mEq Oral Once   senna-docusate  2 tablet Oral BID   Continuous Infusions:  sodium chloride 10 mL/hr at 05/02/19 1656   dextrose 100 mL/hr at 05/09/19 0938   sodium phosphate  Dextrose 5% IVPB 20 mmol (05/09/19 0941)     LOS: 16 days    Time spent: 35 minutes.     Elmarie Shiley, MD [facility] Pager [phone]  If 7PM-7AM, please contact night-coverage [email or web address] Password [email or web address] 05/09/2019, 3:04 PM

## 2019-05-09 NOTE — Progress Notes (Signed)
PHYSICAL THERAPY  Pt amb self around room without any AD pulling IV pole in a safe manner.  Pt ststed   Rica Koyanagi  PTA Acute  Rehabilitation Services Pager      [phone] Office      [phone]

## 2019-05-10 ENCOUNTER — Inpatient Hospital Stay (HOSPITAL_COMMUNITY): Payer: BC Managed Care – PPO

## 2019-05-10 LAB — BASIC METABOLIC PANEL
Anion gap: 13 (ref 5–15)
BUN: <5 mg/dL — ABNORMAL LOW (ref 6–20)
CO2: 21 mmol/L — ABNORMAL LOW (ref 22–32)
Calcium: 6.7 mg/dL — ABNORMAL LOW (ref 8.9–10.3)
Chloride: 110 mmol/L (ref 98–111)
Creatinine, Ser: 0.8 mg/dL (ref 0.44–1.00)
GFR calc Af Amer: >60 mL/min (ref >60)
GFR calc non Af Amer: >60 mL/min (ref >60)
Glucose, Bld: 127 mg/dL — ABNORMAL HIGH (ref 70–99)
Potassium: 3.4 mmol/L — ABNORMAL LOW (ref 3.5–5.1)
Sodium: 144 mmol/L (ref 135–145)

## 2019-05-10 LAB — CBC
HCT: 31.8 % — ABNORMAL LOW (ref 36.0–46.0)
Hemoglobin: 9.3 g/dL — ABNORMAL LOW (ref 12.0–15.0)
MCH: 24.4 pg — ABNORMAL LOW (ref 26.0–34.0)
MCHC: 29.2 g/dL — ABNORMAL LOW (ref 30.0–36.0)
MCV: 83.5 fL (ref 80.0–100.0)
Platelets: 464 10*3/uL — ABNORMAL HIGH (ref 150–400)
RBC: 3.81 MIL/uL — ABNORMAL LOW (ref 3.87–5.11)
RDW: 19.9 % — ABNORMAL HIGH (ref 11.5–15.5)
WBC: 10.9 10*3/uL — ABNORMAL HIGH (ref 4.0–10.5)
nRBC: 0 % (ref 0.0–0.2)

## 2019-05-10 LAB — CULTURE, BLOOD (ROUTINE X 2)
Culture: NO GROWTH
Culture: NO GROWTH
Special Requests: ADEQUATE
Special Requests: ADEQUATE

## 2019-05-10 MED ORDER — POTASSIUM CHLORIDE CRYS ER 20 MEQ PO TBCR
40.0000 meq | EXTENDED_RELEASE_TABLET | Freq: Two times a day (BID) | ORAL | Status: AC
  Administered 2019-05-10 (×2): 40 meq via ORAL
  Filled 2019-05-10 (×2): qty 2

## 2019-05-10 NOTE — Progress Notes (Signed)
2108ml of reddish brown fluid drained from Pluerx. Pt tolerated procedure well. Will continue to monitor

## 2019-05-10 NOTE — Progress Notes (Signed)
Physical Therapy Treatment Patient Details Name: Brittany Rodgers MRN: [id number] DOB: 18-Aug-1971 Today's Date: 05/10/2019    History of Present Illness 48 y.o. female with medical history significant of metastatic breast cancer s/p mastectomy. She presented to ED on 04/23/19 with complaints of increased sob and lethargy. Found to have pleural effusion,SIRS with acute metabolic encephalopathy    PT Comments    Pt agreeable to work with PT. She tolerated activity fairly well. Discussed d/c plan-she prefers to resume OP PT. She stated she has access to DME (RW, cane) at home if needed.    Follow Up Recommendations  Outpatient PT(pt prefers to resume OP PT)     Equipment Recommendations  None recommended by PT    Recommendations for Other Services       Precautions / Restrictions Precautions Precautions: Fall Precaution Comments: R recent mastectomy and R chest wall Biopsy Restrictions Weight Bearing Restrictions: No Other Position/Activity Restrictions: WBAT    Mobility  Bed Mobility Overal bed mobility: Needs Assistance Bed Mobility: Supine to Sit;Sit to Supine     Supine to sit: Supervision;HOB elevated Sit to supine: Supervision;HOB elevated   General bed mobility comments: for safety, lines  Transfers Overall transfer level: Needs assistance Equipment used: None Transfers: Sit to/from Stand Sit to Stand: Supervision         General transfer comment: for safety  Ambulation/Gait Ambulation/Gait assistance: Min guard Gait Distance (Feet): 250 Feet Assistive device: IV Pole Gait Pattern/deviations: Step-through pattern;Decreased stride length     General Gait Details: Pt held on to IV pole for slight support. No LOB with 1 UE support. 1 standing rest break after ~200 feet.   Stairs             Wheelchair Mobility    Modified Rankin (Stroke Patients Only)       Balance Overall balance assessment: Needs assistance Sitting-balance support: No  upper extremity supported;Feet supported Sitting balance-Leahy Scale: Good     Standing balance support: No upper extremity supported;During functional activity Standing balance-Leahy Scale: Fair Standing balance comment: Pt able to bend forward to place toilet paper on toilet prior to sitting without support and only CGA from PT, she also stood at sink initially without support, but then stooped forward with forearms on sink while washing hands.                            Cognition Arousal/Alertness: Awake/alert Behavior During Therapy: WFL for tasks assessed/performed Overall Cognitive Status: Within Functional Limits for tasks assessed Area of Impairment: Orientation;Memory;Following commands;Safety/judgement                 Orientation Level: Place(see general comments)   Memory: Decreased Vences-term memory Following Commands: Follows one step commands with increased time Safety/Judgement: Decreased awareness of safety     General Comments: pt reports concerns of not receiving the therapy she received in OP. became and started crying voicing concerns of pain and stiffness in her arm pit area.      Exercises      General Comments General comments (skin integrity, edema, etc.): massage administered to axillary region      Pertinent Vitals/Pain Pain Assessment: No/denies pain Faces Pain Scale: Hurts a little bit Pain Location: Rt chest Pain Descriptors / Indicators: Sore;Discomfort Pain Intervention(s): Monitored during session;Repositioned    Home Living  Prior Function            PT Goals (current goals can now be found in the care plan section) Acute Rehab PT Goals Patient Stated Goal: no specific goals stated by pt, she wants to continue OPPT Progress towards PT goals: Progressing toward goals    Frequency    Min 3X/week      PT Plan Current plan remains appropriate    Co-evaluation               AM-PAC PT "6 Clicks" Mobility   Outcome Measure  Help needed turning from your back to your side while in a flat bed without using bedrails?: None Help needed moving from lying on your back to sitting on the side of a flat bed without using bedrails?: A Little Help needed moving to and from a bed to a chair (including a wheelchair)?: A Little Help needed standing up from a chair using your arms (e.g., wheelchair or bedside chair)?: A Little Help needed to walk in hospital room?: A Little Help needed climbing 3-5 steps with a railing? : A Little 6 Click Score: 19    End of Session   Activity Tolerance: Patient tolerated treatment well Patient left: in bed;with call bell/phone within reach   PT Visit Diagnosis: Difficulty in walking, not elsewhere classified (R26.2)     Time: 7425-9563 PT Time Calculation (min) (ACUTE ONLY): 11 min  Charges:  $Gait Training: 8-22 mins                       Weston Anna, [place] Pager: [phone] Office: [phone]

## 2019-05-10 NOTE — Progress Notes (Signed)
Just obtained chest xray. Will drain 250 more cc via pleurx catheter, per MD. Patient's vitals stable, except for respiratory rate. MD is aware, and hoping draining will help.Roderick Pee

## 2019-05-10 NOTE — Progress Notes (Signed)
MEWS Guidelines - (patients age 50 and over)  Red - At High Risk for Deterioration Yellow - At risk for Deterioration  1. Go to room and assess patient 2. Validate data. Is this patient's baseline? If data confirmed: 3. Is this an acute change? 4. Administer prn meds/treatments as ordered. 5. Note Sepsis score 6. Review goals of care 7. Sports coach, RRT nurse and Provider. 8. Ask Provider to come to bedside.  9. Document patient condition/interventions/response. 10. Increase frequency of vital signs and focused assessments to at least q15 minutes x 4, then q30 minutes x2. - If stable, then q1h x3, then q4h x3 and then q8h or dept. routine. - If unstable, contact Provider & RRT nurse. Prepare for possible transfer. 11. Add entry in progress notes using the smart phrase ".MEWS". 1. Go to room and assess patient 2. Validate data. Is this patient's baseline? If data confirmed: 3. Is this an acute change? 4. Administer prn meds/treatments as ordered? 5. Note Sepsis score 6. Review goals of care 7. Sports coach and Provider 8. Call RRT nurse as needed. 9. Document patient condition/interventions/response. 10. Increase frequency of vital signs and focused assessments to at least q2h x2. - If stable, then q4h x2 and then q8h or dept. routine. - If unstable, contact Provider & RRT nurse. Prepare for possible transfer. 11. Add entry in progress notes using the smart phrase ".MEWS".  Green - Likely stable Lavender - Comfort Care Only  1. Continue routine/ordered monitoring.  2. Review goals of care. 1. Continue routine/ordered monitoring. 2. Review goals of care.    MD notified d/t patient's MEWS score being in the red.  Patient has been off and on the past few days with her abnormal MEWS per staff report.  Patient reported earlier that she does feel bad.  She is hurting and was given pain medication. MD notified of red MEWS.  Patient in room sleeping at this time, but RR  is still elevated 35-40.

## 2019-05-10 NOTE — Progress Notes (Signed)
PROGRESS NOTE    Brittany Rodgers  SEG:[id number] DOB: June 16, 1971 DOA: 04/23/2019 PCP: [name], MD   Brief Narrative: 48 year old female with past medical history significant for metastatic breast cancer invasive ductal carcinoma status post right mastectomy who presented to the ED on 7/29 2020 with complaints of increased shortness of breath and lethargy.  She had been seen on the day prior in the oncology clinic and was noted to have a new chest wall mass as well as liver mass.  Patient was being considered for possible biopsy as an outpatient.  Next day patient became increasingly [place] of breath and tachycardic.  She was direct to the ED for inpatient admission and management. In the ED patient was hypoxic and tachycardic up to 140 with a CT scan of the chest on admission showing findings worrisome for large right side pleural effusion.  Patient was admitted under hospitalist service for further evaluation and management.  Oncology and IR consult was obtained.  On 7/30, patient underwent thoracentesis and biopsy of chest wall mass.  Fluid cytology and biopsy of the mass positive for metastatic disease.  On 8/6 patient underwent Pleurx catheter placement on the right side.  I patient also develop intermittent shortness of breath and fever.  She was evaluated by infectious disease who agreed with covering with broad-spectrum antibiotic and awaiting culture results.     Assessment & Plan:   Principal Problem:   Malignant pleural effusion Active Problems:   IBS   Hyperlipidemia   Chronic right hip pain   Malignant neoplasm of lower-outer quadrant of right breast of female, estrogen receptor negative (HCC)   S/P mastectomy   Pleural effusion   Fever   1-SIRS with acute metabolic encephalopathy: Sepsis was ruled out. Patient on admission was a started on broad-spectrum antibiotics.  No source identified.  Antibiotics were stopped on 8/6.  Patient continued to spike intermittent  high fever.  However white blood cell, lactic acid and procalcitonin level were trending down. On 8/8 patient was having intermittent fever she was getting more tachycardic and more drowsy.  A T-max of 103.  Infectious was work-up negative.  Seen by ID who suspected that cancer could be calcium fever.  Recommending continue antibiotics until pleural cultures available. I spoke with Dr. Graylon Good today review pleural fluid culture results and white count. Plan is to discontinue antibiotics and observe. Afebrile off antibiotics.   Metastatic  breast cancer invasive ductal carcinoma, status post right mastectomy.  Recent diagnosis of metastasis to the chest wall and right pleural effusion. Patient followed by [name]. Patient had a Pleurx catheter placed on 8/6.  Apparently there was no order to drain the catheter.  Patient went 4 days without draining.  Order in place for drain Pleurx catheter every other day. Patient to be started on carboplatin Gemcitabine  possible next week Patient need to learn how to take care of drain. HH will only be able to go to patient house on the 19.  Patient learning to take care of pleurex catheter   Right axillary pain: At the site of metastasis to the anterior chest wall and pectoralis muscle. Continue with MS Contin  Sinus tachycardia: Improved for metoprolol.  Constipation; continue with stool softener and MiraLAX.  Hyperliipidemia: Statin discontinued due to liver mets Hypocalcemia.  Likely related to malignancy.  Patient received pamidronate as an outpatient.  Calcium level now low, stable follow.  Corrected calcium for albumin 7.6  Hypokalemia: oral supplement   Hypophosphatemia ; replete  IV and oral.  Hypernatremia; start D 5. Improved with fluids.    Estimated body mass index is 32.06 kg/m as calculated from the following:   Height as of this encounter: 5\' 2"  (1.575 m).   Weight as of this encounter: 79.5 kg.   DVT prophylaxis:  Code  Status: Full code Family Communication: Care discussed with patient Disposition Plan: Home in 24-hour  Consultants:   Oncology  Interventional radiology  Infectious disease  Procedures:  7/30-thoracentesis and biopsy of chest wall mass.  8/6-Pleurx catheter placement on right side.    Antimicrobials:  Vancomycin Zosyn 8-10---813  Subjective: Alert, having SOB this am. pleurex catheter was drain.   Objective: Vitals:   05/09/19 1800 05/09/19 2028 05/10/19 0531 05/10/19 1518  BP:  (!) 151/99 (!) 148/87 (!) 149/96  Pulse: (!) 114 (!) 110 (!) 114 (!) 102  Resp: 20 18 16    Temp:  98.9 F (37.2 C) 98.9 F (37.2 C) 98.5 F (36.9 C)  TempSrc:  Oral Oral Oral  SpO2:  98% 98% 99%  Weight:      Height:        Intake/Output Summary (Last 24 hours) at 05/10/2019 1535 Last data filed at 05/10/2019 0748 Gross per 24 hour  Intake 1484.21 ml  Output 250 ml  Net 1234.21 ml   Filed Weights   04/24/19 0003 05/07/19 1200  Weight: 78.5 kg 79.5 kg    Examination:  General exam: NAD Respiratory system: Crackles bases Cardiovascular system: S 1, S 2 RRR Gastrointestinal system; bs present, soft, nt Central nervous system:non focal.  Extremities: symmetric power. r.  Skin: no rashes.    Data Reviewed: I have personally reviewed following labs and imaging studies  CBC: Recent Labs  Lab 05/04/19 0346 05/05/19 0416 05/06/19 0426 05/07/19 0600 05/08/19 0339 05/10/19 0948  WBC 9.8 8.4 6.7 7.7 8.5 10.9*  NEUTROABS 7.0 6.2 4.5 5.2 5.3  --   HGB 8.5* 8.9* 8.6* 8.9* 9.3* 9.3*  HCT 28.6* 30.0* 28.7* 30.7* 31.2* 31.8*  MCV 84.9 84.7 84.2 83.4 82.8 83.5  PLT 302 366 333 388 445* 993*   Basic Metabolic Panel: Recent Labs  Lab 05/05/19 0416 05/06/19 0426 05/07/19 0600 05/08/19 0339 05/09/19 0452 05/09/19 1303 05/10/19 0948  NA 136 140 140 144 146* 143 144  K 3.6 3.5 3.1* 3.1* 3.2* 3.3* 3.4*  CL 105 105 105 109 114* 109 110  CO2 24 24 25  21* 21* 21* 21*   GLUCOSE 112* 112* 119* 131* 117* 150* 127*  BUN 6 6 <5* 5* 6 6 <5*  CREATININE 0.45 0.56 0.54 0.65 0.75 0.73 0.80  CALCIUM 6.9* 6.7* 6.7* 6.6* 6.6* 6.5* 6.7*  MG 1.7  --   --   --   --   --   --   PHOS 1.3* 1.2*  --   --  1.7* 3.7  --    GFR: Estimated Creatinine Clearance: 83.1 mL/min (by C-G formula based on SCr of 0.8 mg/dL). Liver Function Tests: Recent Labs  Lab 05/06/19 0426  AST 13*  ALT 18  ALKPHOS 118  BILITOT 0.4  PROT 6.1*  ALBUMIN 2.3*   No results for input(s): LIPASE, AMYLASE in the last 168 hours. No results for input(s): AMMONIA in the last 168 hours. Coagulation Profile: No results for input(s): INR, PROTIME in the last 168 hours. Cardiac Enzymes: No results for input(s): CKTOTAL, CKMB, CKMBINDEX, TROPONINI in the last 168 hours. BNP (last 3 results) No results for input(s): PROBNP in the last  8760 hours. HbA1C: No results for input(s): HGBA1C in the last 72 hours. CBG: No results for input(s): GLUCAP in the last 168 hours. Lipid Profile: No results for input(s): CHOL, HDL, LDLCALC, TRIG, CHOLHDL, LDLDIRECT in the last 72 hours. Thyroid Function Tests: No results for input(s): TSH, T4TOTAL, FREET4, T3FREE, THYROIDAB in the last 72 hours. Anemia Panel: No results for input(s): VITAMINB12, FOLATE, FERRITIN, TIBC, IRON, RETICCTPCT in the last 72 hours. Sepsis Labs: Recent Labs  Lab 05/04/19 0346 05/06/19 0426  PROCALCITON 0.46  --   LATICACIDVEN 1.7 1.9    Recent Results (from the past 240 hour(s))  Culture, blood (routine x 2)     Status: None   Collection Time: 05/02/19  7:44 AM   Specimen: BLOOD  Result Value Ref Range Status   Specimen Description   Final    BLOOD LEFT ARM Performed at [facility] [address] [place], [place] [address]    Special Requests   Final    BOTTLES DRAWN AEROBIC AND ANAEROBIC Blood Culture adequate volume Performed at [facility] [address], [place], [place]  [address]    Culture   Final    NO GROWTH 5 DAYS Performed at [facility], [place] [address], [place], [place] [address]    Report Status 05/07/2019 FINAL  Final  Culture, blood (routine x 2)     Status: None   Collection Time: 05/02/19  7:44 AM   Specimen: BLOOD LEFT HAND  Result Value Ref Range Status   Specimen Description   Final    BLOOD LEFT HAND Performed at [facility] [address] [place], [place] [address]    Special Requests   Final    BOTTLES DRAWN AEROBIC AND ANAEROBIC Blood Culture adequate volume Performed at [facility] [address], [place], [place] [address]    Culture   Final    NO GROWTH 5 DAYS Performed at [facility], [place] [address], [place], [place] [address]    Report Status 05/07/2019 FINAL  Final  Culture, blood (Routine X 2) w Reflex to ID Panel     Status: None   Collection Time: 05/05/19 10:03 AM   Specimen: BLOOD  Result Value Ref Range Status   Specimen Description   Final    BLOOD LEFT ANTECUBITAL Performed at [facility], [place] [address], [place], [place] [address]    Special Requests   Final    BOTTLES DRAWN AEROBIC AND ANAEROBIC Blood Culture adequate volume Performed at [facility] [address], [place], [place] [address]    Culture   Final    NO GROWTH 5 DAYS Performed at [facility], [place] [address], [place], [place] [address]    Report Status 05/10/2019 FINAL  Final  Culture, blood (Routine X 2) w Reflex to ID Panel     Status: None   Collection Time: 05/05/19 10:12 AM   Specimen: BLOOD LEFT HAND  Result Value Ref Range Status   Specimen Description   Final    BLOOD LEFT HAND Performed at [facility] [address], [place],  [address]    Special Requests   Final    BOTTLES DRAWN AEROBIC ONLY Blood Culture adequate volume Performed at [facility] [address], [place],  [address]     Culture   Final    NO GROWTH 5 DAYS Performed at [facility], [place] [address], [place], [place] [address]    Report Status 05/10/2019 FINAL  Final  Body fluid culture     Status: None   Collection Time: 05/06/19  1:58 PM   Specimen: Pleura; Body Fluid  Result Value Ref Range Status   Specimen Description   Final    PLEURAL Performed at [facility] [address], [place], [place] [address]    Special Requests   Final    NONE Performed at [facility], [place] [address], [place], [place] [address]    Gram Stain   Final    ABUNDANT WBC PRESENT,BOTH PMN AND MONONUCLEAR NO ORGANISMS SEEN    Culture   Final    NO GROWTH Performed at [facility], [place] [address] [place], [place] [address]    Report Status 05/09/2019 FINAL  Final         Radiology Studies: No results found.      Scheduled Meds: . docusate sodium  200 mg Oral BID  . enoxaparin (LOVENOX) injection  40 mg Subcutaneous Daily  . furosemide  20 mg Intravenous Once  . metoprolol tartrate  50 mg Oral BID  . morphine  30 mg Oral Q12H  . polyethylene glycol  17 g Oral BID  . potassium chloride  40 mEq Oral BID  . senna-docusate  2 tablet Oral BID   Continuous Infusions: . sodium chloride 10 mL/hr at 05/02/19 1656  . dextrose 100 mL/hr at 05/10/19 0750     LOS: 17 days    Time spent: 35 minutes.     Elmarie Shiley, MD [facility] Pager [phone]  If 7PM-7AM, please contact night-coverage [email or web address] Password [email or web address] 05/10/2019, 3:35 PM

## 2019-05-10 NOTE — Progress Notes (Signed)
Occupational Therapy Treatment Patient Details Name: Brittany Rodgers MRN: [id number] DOB: Mar 21, 1971 Today's Date: 05/10/2019    History of present illness Brittany Rodgers is a 48 y.o. female with medical history significant of metastatic breast cancer s/p mastectomy. She presented to ED on 04/23/19 with complaints of increased sob and lethargy. Found to have pleural effusion,SIRS with acute metabolic encephalopathy   OT comments  Pt slowly progressing toward OT goals, with complaints of stiffness in axillary region. Pt engaged in sink level ADL with set up and supervision for safety. Massage administered to R axillary region to improve ROM and reduce swelling. Pt addressed concerns stiffness and muscle fatigue in that area. Pt will benefit from continued acute OT to address established. DC and freq remains the same. OT will continue to follow acutely.    Follow Up Recommendations  Outpatient OT;Supervision/Assistance - 24 hour(some confusion today--if does not clear may need different venue if not 24 hour care)    Equipment Recommendations  None recommended by OT    Recommendations for Other Services      Precautions / Restrictions Precautions Precautions: Fall Precaution Comments: R recent mastectomy and R chest wall Biopsy Restrictions Weight Bearing Restrictions: No Other Position/Activity Restrictions: WBAT       Mobility Bed Mobility Overal bed mobility: Needs Assistance             General bed mobility comments: Pt received sitting on bench in room.  Transfers Overall transfer level: Needs assistance Equipment used: None Transfers: Sit to/from Stand Sit to Stand: Supervision         General transfer comment: increased stability with functional mobility, however, still limited with activity tolerance.     Balance Overall balance assessment: Needs assistance Sitting-balance support: No upper extremity supported;Feet supported Sitting balance-Leahy Scale:  Good     Standing balance support: No upper extremity supported;During functional activity Standing balance-Leahy Scale: Fair Standing balance comment: Pt able to bend forward to place toilet paper on toilet prior to sitting without support and only CGA from PT, she also stood at sink initially without support, but then stooped forward with forearms on sink while washing hands.                           ADL either performed or assessed with clinical judgement   ADL Overall ADL's : Needs assistance/impaired     Grooming: Set up;Standing Grooming Details (indicate cue type and reason): No physical assistance required                             Functional mobility during ADLs: Supervision/safety General ADL Comments: Pt agreeable to standing at the sink for grooming task.     Vision       Perception     Praxis      Cognition Arousal/Alertness: Lethargic;Suspect due to medications(however RN reports that pt has been in and out of it all day) Behavior During Therapy: Flat affect Overall Cognitive Status: Impaired/Different from baseline Area of Impairment: Orientation;Memory;Following commands;Safety/judgement                 Orientation Level: Place(see general comments)   Memory: Decreased [place]-term memory Following Commands: Follows one step commands with increased time Safety/Judgement: Decreased awareness of safety     General Comments: pt reports concerns of not receiving the therapy she received in OP. became and started crying voicing concerns of pain  and stiffness in her arm pit area.        Exercises     Shoulder Instructions       General Comments massage administered to axillary region    Pertinent Vitals/ Pain       Pain Assessment: Faces Faces Pain Scale: Hurts a little bit Pain Location: Rt chest Pain Descriptors / Indicators: Sore;Discomfort Pain Intervention(s): Monitored during session;Repositioned  Home Living                                           Prior Functioning/Environment              Frequency  Min 2X/week        Progress Toward Goals  OT Goals(current goals can now be found in the care plan section)  Progress towards OT goals: Progressing toward goals  Acute Rehab OT Goals Patient Stated Goal: no specific goals stated by pt, she wants to continue OPPT OT Goal Formulation: With patient Time For Goal Achievement: 05/13/19 Potential to Achieve Goals: Good ADL Goals Pt Will Perform Lower Body Bathing: with modified independence;with adaptive equipment;sitting/lateral leans;sit to/from stand Pt Will Perform Lower Body Dressing: with modified independence;sitting/lateral leans;sit to/from stand;with adaptive equipment Pt Will Transfer to Toilet: with modified independence Pt Will Perform Tub/Shower Transfer: with modified independence;shower seat;rolling walker Additional ADL Goal #1: pt will gather clothes and complete adl at mod I level Additional ADL Goal #2: pt will be independent with HEP for RUE  Plan Discharge plan remains appropriate    Co-evaluation                 AM-PAC OT "6 Clicks" Daily Activity     Outcome Measure   Help from another person eating meals?: None Help from another person taking care of personal grooming?: A Little Help from another person toileting, which includes using toliet, bedpan, or urinal?: A Little Help from another person bathing (including washing, rinsing, drying)?: A Little Help from another person to put on and taking off regular upper body clothing?: A Little Help from another person to put on and taking off regular lower body clothing?: A Little 6 Click Score: 19    End of Session Equipment Utilized During Treatment: Gait belt(BSC)  OT Visit Diagnosis: Unsteadiness on feet (R26.81);Other abnormalities of gait and mobility (R26.89);Other symptoms and signs involving cognitive function Pain -  Right/Left: Right Pain - part of body: Arm   Activity Tolerance Patient tolerated treatment well   Patient Left in bed;with call bell/phone within reach;with bed alarm set(sitting eating meal)   Nurse Communication Mobility status(pt urinated in [facility], pt seems out of it today, temp 100.1, had O2 off when I entered with sats 92% and HR 125, placed pt back on 2 liters of O2 (on on wall but pt had removed [place]))        Time: 1251-1326 OT Time Calculation (min): 35 min  Charges: OT General Charges $OT Visit: 1 Visit OT Treatments $Self Care/Home Management : 8-22 mins $Massage: 8-22 mins  Minus Breeding, MSOT, OTR/L  Supplemental Rehabilitation Services  [phone]    Marius Ditch 05/10/2019, 1:36 PM

## 2019-05-11 MED ORDER — MORPHINE SULFATE (PF) 2 MG/ML IV SOLN
2.0000 mg | Freq: Once | INTRAVENOUS | Status: AC | PRN
  Administered 2019-05-11: 2 mg via INTRAVENOUS
  Filled 2019-05-11: qty 1

## 2019-05-11 MED ORDER — ALBUTEROL SULFATE (2.5 MG/3ML) 0.083% IN NEBU: 2.5000 mg | INHALATION_SOLUTION | Freq: Four times a day (QID) | RESPIRATORY_TRACT | Status: DC | PRN

## 2019-05-11 NOTE — Progress Notes (Signed)
PROGRESS NOTE    Brittany Rodgers  DXA:[id number] DOB: [birth year] DOA: 04/23/2019 PCP: [name], MD   Brief Narrative: 48 year old female with past medical history significant for metastatic breast cancer invasive ductal carcinoma status post right mastectomy who presented to the ED on 7/29 2020 with complaints of increased shortness of breath and lethargy.  She had been seen on the day prior in the oncology clinic and was noted to have a new chest wall mass as well as liver mass.  Patient was being considered for possible biopsy as an outpatient.  Next day patient became increasingly Petrucci of breath and tachycardic.  She was direct to the ED for inpatient admission and management. In the ED patient was hypoxic and tachycardic up to 140 with a CT scan of the chest on admission showing findings worrisome for large right side pleural effusion.  Patient was admitted under hospitalist service for further evaluation and management.  Oncology and IR consult was obtained.  On 7/30, patient underwent thoracentesis and biopsy of chest wall mass.  Fluid cytology and biopsy of the mass positive for metastatic disease.  On 8/6 patient underwent Pleurx catheter placement on the right side.  I patient also develop intermittent shortness of breath and fever.  She was evaluated by infectious disease who agreed with covering with broad-spectrum antibiotic and awaiting culture results.     Assessment & Plan:   Principal Problem:   Malignant pleural effusion Active Problems:   IBS   Hyperlipidemia   Chronic right hip pain   Malignant neoplasm of lower-outer quadrant of right breast of female, estrogen receptor negative (HCC)   S/P mastectomy   Pleural effusion   Fever   1-SIRS with acute metabolic encephalopathy: Sepsis was ruled out. Patient on admission was a started on broad-spectrum antibiotics.  No source identified.  Antibiotics were stopped on 8/6.  Patient continued to spike intermittent  high fever.  However white blood cell, lactic acid and procalcitonin level were trending down. On 8/8 patient was having intermittent fever she was getting more tachycardic and more drowsy.  A T-max of 103.  Infectious was work-up negative.  Seen by ID who suspected that cancer could be calcium fever.  Recommending continue antibiotics until pleural cultures available. I spoke with Dr. Graylon Good today review pleural fluid culture results and white count. Plan is to discontinue antibiotics and observe. Afebrile off antibiotics.   Metastatic  breast cancer invasive ductal carcinoma, status post right mastectomy.  Recent diagnosis of metastasis to the chest wall and right pleural effusion. Patient followed by [name]. Patient had a Pleurx catheter placed on 8/6.  Apparently there was no order to drain the catheter.  Patient went 4 days without draining.  Order in place for drain Pleurx catheter every other day. Patient to be started on carboplatin Gemcitabine  possible next week Patient need to learn how to take care of drain. CM working on arranging [facility] Patient learning to take care of pleurex catheter   Acute Respiratory distress; related to pleural effusion; Patient has been getting pleurex catheter every day.  Last night she was notice to be very tachypnea, SOB, chest x ray showed increase size pleural effusion.  pleurex catheter was drain again last night. With resolution, improvement of symptoms.  Monitor overnight.   Right axillary pain: At the site of metastasis to the anterior chest wall and pectoralis muscle. Continue with MS Contin  Sinus tachycardia: Improved for metoprolol.  Constipation; continue with stool softener and MiraLAX.  Hyperliipidemia: Statin discontinued due to liver mets Hypocalcemia.  Likely related to malignancy.  Patient received pamidronate as an outpatient.  Calcium level now low, stable follow.  Corrected calcium for albumin 7.6  Hypokalemia: oral  supplement   Hypophosphatemia ; replaced.,  Hypernatremia; start D 5. Improved with fluids.  Stop IV fluids.   Estimated body mass index is 32.06 kg/m as calculated from the following:   Height as of this encounter: 5\' 2"  (1.575 m).   Weight as of this encounter: 79.5 kg.   DVT prophylaxis:  Code Status: Full code Family Communication: Care discussed with patient Disposition Plan:patient was notice to be in respiratory distress last night, she required her catheter to be drain again last night. Needs to monitor overnight, for respiratory distress.  We need safe discharge plan disposition, patient will need help at home with pleurex catheter   Consultants:   Oncology  Interventional radiology  Infectious disease  Procedures:  7/30-thoracentesis and biopsy of chest wall mass.  8/6-Pleurx catheter placement on right side.    Antimicrobials:  Vancomycin Zosyn 8-10---813  Subjective: Alert, she is breathing better this am.  Events from last night notice.   Objective: Vitals:   05/10/19 2255 05/10/19 2300 05/11/19 0237 05/11/19 0543  BP: (!) 138/95  129/88 (!) 133/97  Pulse: (!) 104  (!) 102 (!) 106  Resp: (!) 36 (!) 32 (!) 24 (!) 22  Temp: (!) 97.5 F (36.4 C)  98 F (36.7 C) 98.1 F (36.7 C)  TempSrc: Oral  Oral Oral  SpO2: 100%  98% 100%  Weight:      Height:        Intake/Output Summary (Last 24 hours) at 05/11/2019 1010 Last data filed at 05/11/2019 0230 Gross per 24 hour  Intake 2443.69 ml  Output 250 ml  Net 2193.69 ml   Filed Weights   04/24/19 0003 05/07/19 1200  Weight: 78.5 kg 79.5 kg    Examination:  General exam: NAD Respiratory system: Crackles bases.  Cardiovascular system: S 1, S 2 RRR Gastrointestinal system; BS present, soft, nt Central nervous system: non focal.  Extremities:Symmetric power.  Skin: no rashes.    Data Reviewed: I have personally reviewed following labs and imaging studies  CBC: Recent Labs  Lab  05/05/19 0416 05/06/19 0426 05/07/19 0600 05/08/19 0339 05/10/19 0948  WBC 8.4 6.7 7.7 8.5 10.9*  NEUTROABS 6.2 4.5 5.2 5.3  --   HGB 8.9* 8.6* 8.9* 9.3* 9.3*  HCT 30.0* 28.7* 30.7* 31.2* 31.8*  MCV 84.7 84.2 83.4 82.8 83.5  PLT 366 333 388 445* 850*   Basic Metabolic Panel: Recent Labs  Lab 05/05/19 0416 05/06/19 0426 05/07/19 0600 05/08/19 0339 05/09/19 0452 05/09/19 1303 05/10/19 0948  NA 136 140 140 144 146* 143 144  K 3.6 3.5 3.1* 3.1* 3.2* 3.3* 3.4*  CL 105 105 105 109 114* 109 110  CO2 24 24 25  21* 21* 21* 21*  GLUCOSE 112* 112* 119* 131* 117* 150* 127*  BUN 6 6 <5* 5* 6 6 <5*  CREATININE 0.45 0.56 0.54 0.65 0.75 0.73 0.80  CALCIUM 6.9* 6.7* 6.7* 6.6* 6.6* 6.5* 6.7*  MG 1.7  --   --   --   --   --   --   PHOS 1.3* 1.2*  --   --  1.7* 3.7  --    GFR: Estimated Creatinine Clearance: 83.1 mL/min (by C-G formula based on SCr of 0.8 mg/dL). Liver Function Tests: Recent Labs  Lab 05/06/19 862 705 9201  AST 13*  ALT 18  ALKPHOS 118  BILITOT 0.4  PROT 6.1*  ALBUMIN 2.3*   No results for input(s): LIPASE, AMYLASE in the last 168 hours. No results for input(s): AMMONIA in the last 168 hours. Coagulation Profile: No results for input(s): INR, PROTIME in the last 168 hours. Cardiac Enzymes: No results for input(s): CKTOTAL, CKMB, CKMBINDEX, TROPONINI in the last 168 hours. BNP (last 3 results) No results for input(s): PROBNP in the last 8760 hours. HbA1C: No results for input(s): HGBA1C in the last 72 hours. CBG: No results for input(s): GLUCAP in the last 168 hours. Lipid Profile: No results for input(s): CHOL, HDL, LDLCALC, TRIG, CHOLHDL, LDLDIRECT in the last 72 hours. Thyroid Function Tests: No results for input(s): TSH, T4TOTAL, FREET4, T3FREE, THYROIDAB in the last 72 hours. Anemia Panel: No results for input(s): VITAMINB12, FOLATE, FERRITIN, TIBC, IRON, RETICCTPCT in the last 72 hours. Sepsis Labs: Recent Labs  Lab 05/06/19 0426  LATICACIDVEN 1.9     Recent Results (from the past 240 hour(s))  Culture, blood (routine x 2)     Status: None   Collection Time: 05/02/19  7:44 AM   Specimen: BLOOD  Result Value Ref Range Status   Specimen Description   Final    BLOOD LEFT ARM Performed at [facility] [address] [place], [place] [address]    Special Requests   Final    BOTTLES DRAWN AEROBIC AND ANAEROBIC Blood Culture adequate volume Performed at [facility] [address], [place], [place] [address]    Culture   Final    NO GROWTH 5 DAYS Performed at [facility], [place] [address] [place], [place] [address]    Report Status 05/07/2019 FINAL  Final  Culture, blood (routine x 2)     Status: None   Collection Time: 05/02/19  7:44 AM   Specimen: BLOOD LEFT HAND  Result Value Ref Range Status   Specimen Description   Final    BLOOD LEFT HAND Performed at [facility] [address], [place], [place] [address]    Special Requests   Final    BOTTLES DRAWN AEROBIC AND ANAEROBIC Blood Culture adequate volume Performed at [facility] [address] [place], [place] [address]    Culture   Final    NO GROWTH 5 DAYS Performed at [facility], [place] [address], [place], [place] [address]    Report Status 05/07/2019 FINAL  Final  Culture, blood (Routine X 2) w Reflex to ID Panel     Status: None   Collection Time: 05/05/19 10:03 AM   Specimen: BLOOD  Result Value Ref Range Status   Specimen Description   Final    BLOOD LEFT ANTECUBITAL Performed at [facility], [place] [address], [place], [place] [address]    Special Requests   Final    BOTTLES DRAWN AEROBIC AND ANAEROBIC Blood Culture adequate volume Performed at [facility] [address] [place], [place] [address]    Culture   Final    NO GROWTH 5 DAYS Performed at [facility], [place] [address], [place], [place] [address]    Report Status 05/10/2019  FINAL  Final  Culture, blood (Routine X 2) w Reflex to ID Panel     Status: None   Collection Time: 05/05/19 10:12 AM   Specimen: BLOOD LEFT HAND  Result Value Ref Range Status   Specimen Description  Final    BLOOD LEFT HAND Performed at [facility], [place] [address], [place], [place] [address]    Special Requests   Final    BOTTLES DRAWN AEROBIC ONLY Blood Culture adequate volume Performed at [facility] [address], [place], [place] [address]    Culture   Final    NO GROWTH 5 DAYS Performed at [facility], [place] [address], [place], [place] [address]    Report Status 05/10/2019 FINAL  Final  Body fluid culture     Status: None   Collection Time: 05/06/19  1:58 PM   Specimen: Pleura; Body Fluid  Result Value Ref Range Status   Specimen Description   Final    PLEURAL Performed at [facility] [address] [place], [place] [address]    Special Requests   Final    NONE Performed at [facility], [place] [address] [place], [place] [address]    Gram Stain   Final    ABUNDANT WBC PRESENT,BOTH PMN AND MONONUCLEAR NO ORGANISMS SEEN    Culture   Final    NO GROWTH Performed at [facility], [place] [address], [place], [place] [address]    Report Status 05/09/2019 FINAL  Final         Radiology Studies: Dg Chest Port 1 View  Result Date: 05/10/2019 CLINICAL DATA:  Shortness of breath. EXAM: PORTABLE CHEST 1 VIEW COMPARISON:  Radiograph 05/05/2019, additional priors. FINDINGS: Accessed right chest port remains in place. Right PleurX catheter tip at the apex. Large pleural effusion has decreased from prior exam with some improving aeration of the right lung apex. Similar leftward mediastinal shift to before. Left lung is grossly clear. No pneumothorax or other change from prior exam. IMPRESSION: Right PleurX catheter in place with persistent but diminishing large right pleural effusion.  Improving right apical aeration. Otherwise unchanged exam. Electronically Signed   By: Keith Rake M.D.   On: 05/10/2019 22:22        Scheduled Meds: . docusate sodium  200 mg Oral BID  . enoxaparin (LOVENOX) injection  40 mg Subcutaneous Daily  . furosemide  20 mg Intravenous Once  . metoprolol tartrate  50 mg Oral BID  . morphine  30 mg Oral Q12H  . polyethylene glycol  17 g Oral BID  . senna-docusate  2 tablet Oral BID   Continuous Infusions: . sodium chloride 10 mL/hr at 05/02/19 1656     LOS: 18 days    Time spent: 35 minutes.     Elmarie Shiley, MD [facility] Pager [phone]  If 7PM-7AM, please contact night-coverage [email or web address] Password [email or web address] 05/11/2019, 10:10 AM

## 2019-05-11 NOTE — TOC Progression Note (Addendum)
Transition of Care [facility]) - Progression Note    Patient Details  Name: Brittany Rodgers MRN: [id number] Date of Birth: [birth year]  Transition of Care [facility] CM/SW Contact  Joaquin Courts, RN Phone Number: 05/11/2019, 2:31 PM  Clinical Narrative:  CM received notification from [facility] that they will be unable to provide [facility] services to patient due to delay in care due to staffing  which they feel is unsafe given the patient's need for [facility] RN for pleurex management. CM reached out to area [facility] agencies ( [facility], [facility], [facility], [facility], [facility], [facility], and [facility]) all have declined to provide services either because the insurance is out of network or because of staffing.  At this time, no HH agency is available to provide services. CM spoke with patient about all of this and patient states she and her husband have discussed things and they each have two family members who are in the healthcare field and can learn how to drain the pleurex.  Patient and bedside RN to coordinate a time for family members to come in for teaching.         Barriers to Discharge: Continued Medical Work up  Expected Discharge Plan and Services     Discharge Planning Services: CM Consult     Expected Discharge Date: (unknown)                                     Social Determinants of Health (SDOH) Interventions    Readmission Risk Interventions No flowsheet data found.

## 2019-05-11 NOTE — Progress Notes (Addendum)
200cc of brownish red fluid drained from pleurx. Pt tolerated procedure well. Pleurx teaching and demonstration provided to patient and husband. Both verbalized understanding.

## 2019-05-11 NOTE — Progress Notes (Signed)
Pt c/o SOB. VSS. MD notified. Per MD, ok to drain pleurx cath again. 200cc's drained. Pt tolerated well.  Will continue to monitor.

## 2019-05-11 NOTE — Progress Notes (Signed)
[place]  Telephone:(336) [phone] Fax:(336) [phone]    ID: Bosie Helper Welcome DOB: [birth year]  MR#: [id number]  EHM#:[id number]  Patient Care Team: [name], MD as PCP - [name], MD as Consulting Physician (General Surgery) Lakeithia Rasor, [name], MD as Consulting Physician (Oncology) [name], MD as Attending Physician (Radiation Oncology) [name], MD as Consulting Physician (Gastroenterology) [name], MD as Consulting Physician (Obstetrics and Gynecology) [name], RN as Oncology Nurse Navigator [name], RN as Oncology Nurse Navigator [name], MD as Consulting Physician (Plastic Surgery) OTHER MD: Dr. Siri Cole (urology)    CHIEF COMPLAINT: Triple negative breast cancer  CURRENT TREATMENT: pembrolizumab; carboplatin, gemcitabine   INTERVAL HISTORY: Brittany Rodgers returns today for follow-up and treatment of her triple negative breast cancer.    Brittany Rodgers was hospitalized for right pleural effusion and tachycardia following her appointment here on 04/23/2019.  She is accompanied by 2 of her sisters, Brittany Rodgers who is a CNA, and a second sister.    She was discharged yesterday, 05/12/2019. During admission, she underwent angio chest CT on 04/23/2019, which showed: recurrent right effusion; multiple bilateral pulmonary nodules, likely metastatic; invasive anterior right chest wall lesion.  This was biopsied 04/24/2019 and was positive for invasive ductal carcinoma ([id number])  She underwent repeat thoracentesis on 04/24/2019. Cytology from the procedure ([id number]) was also positive for metastatic adenocarcinoma.   She had a Pleurx placed and she was trained on how to do self drainage at home by nursing during the hospital and this was again reviewed today.  She is scheduled to start carboplatin and gemcitabine today, to be repeated days 1 and 8 of each 21-day cycle.   REVIEW OF SYSTEMS: Brittany Rodgers has had  significant pain secondary to her cancer and she is currently on MS Contin 30 mg twice daily with MSIR for breakthrough pain.  She is taking the medication appropriately and it is not constipating her.  She feels very [place] of breath and feels oxygen does help her.  She is getting significant support from her sisters at home and she feels her husband is not providing the support to her that she would wish.  This is an issue that is being worked out between them.  They are continuing to keep pandemic precautions.  HISTORY OF CURRENT ILLNESS: From the original intake note:  Brittany Rodgers presented with a palpable right breast lump and associated tenderness for approximately 1 week. She called her doctor immediately following  She underwent unilateral right diagnostic mammography with tomography and right breast ultrasonography at [facility] on 10/28/2018 showing: Breast Density Category C. Radiopaque BB was placed at the site of the patient's palpable lump in the central right breast. An irregular hyperdense mass with associated increased trabecular thickening is seen in the deep central right breast. On physical exam, a firm, fixed mass in the central right breast is palpated. Target ultrasound is performed, showing and irregular hypoechoic mass at the 8 o'clock position 3 cm from the nipple. It measures 3.6 x 2.9 x 2.3 cm. There is associated peripheral vascularity. Evaluation of the right axilla demonstrates a single prominent lymph node demonstrating 0.4 cm cortical thickening. The remaining lymph nodes ave normal appearance with a thin, symmetric cortex.   Accordingly on 10/29/2018 she proceeded to biopsy of the right breast area in question. The pathology from this procedure showed ([id number]): invasive ductal carcinoma, grade III. Prognostic indicators significant for: estrogen receptor, 0% negative and progesterone receptor, 0%  negative. Proliferation marker Ki67 at 80%. HER2 negative (1+) by  immunohistochemistry.  Biopsy of the right axilla was performed on the same day. The pathology from this procedure showed ([id number]): lymph node, negative for carcinoma.  This was felt to be concordant  The patient's subsequent history is as detailed below.   PAST MEDICAL HISTORY: Past Medical History:  Diagnosis Date   Cancer [facility])    BREAST CANCER RIGHT BREAST   Chronic cough    Family history of breast cancer    GERD (gastroesophageal reflux disease)    Hyperlipidemia    IBS (irritable bowel syndrome)    sees Dr. Silvano Rusk    Routine gynecological examination    sees Dr. Freda Munro     PAST SURGICAL HISTORY: Past Surgical History:  Procedure Laterality Date   CESAREAN SECTION     IR PERC PLEURAL DRAIN W/INDWELL CATH W/IMG GUIDE  05/01/2019   MASTECTOMY W/ SENTINEL NODE BIOPSY Right 03/04/2019   Procedure: RIGHT MASTECTOMY WITH RIGHT AXILLARY SENTINEL LYMPH NODE BIOPSY;  Surgeon: Rolm Bookbinder, MD;  Location: [place];  Service: General;  Laterality: Right;   PORTACATH PLACEMENT Right 11/14/2018   Procedure: INSERTION PORT-A-CATH WITH ULTRASOUND;  Surgeon: Rolm Bookbinder, MD;  Location: [place];  Service: General;  Laterality: Right;   ROOT CANAL       FAMILY HISTORY: Family History  Problem Relation Age of Onset   Hypertension Other    Breast cancer Other        mat first cousin's daughter   Breast cancer Maternal Aunt    Hypertension Mother    Breast cancer Maternal Aunt    Breast cancer Maternal Aunt    Brittany Rodgers Mae's father died from an unknown cause at age 16. [name] died from unknown health complications at age 50. The patient has 4 sisters. Brittany Rodgers has 3 maternal aunts that have been diagnosed with breast cancer. Patient denies anyone in her family having ovarian, prostate, or pancreatic cancer.    GYNECOLOGIC HISTORY:  No LMP recorded. (Menstrual status: Chemotherapy). 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 Her husband, Maliha Outten, is a Administrator. The patient has two children, [name] and [name], from another relationship. [name] is 84, lives in [place]. [name] is 46, lives in [place], and is in Mud[employer].  The patient's husband [name] has a child, [name], from another relationship; she lives in [place] and is in school. The patient has 5 grandchildren. She attends the [employer].   ADVANCED DIRECTIVES: Brittany's husband, [name], is automatically her healthcare power of attorney.     HEALTH MAINTENANCE: Social History   Tobacco Use   Smoking status: Never Smoker   Smokeless tobacco: Never Used  Substance Use Topics   Alcohol use: No    Alcohol/week: 0.0 standard drinks   Drug use: No    Colonoscopy: yes  PAP: 02/2018  Bone density: no   Allergies  Allergen Reactions   Barbiturates Other (See Comments)    Keeps pt awake   Methocarbamol Itching   Sulfonamide Derivatives Itching    Current Outpatient Medications  Medication Sig Dispense Refill   benzonatate (TESSALON) 100 MG capsule Take 1 capsule (100 mg total) by mouth 3 (three) times daily as needed for cough. 20 capsule 0   docusate sodium (COLACE)  100 MG capsule Take 2 capsules (200 mg total) by mouth 2 (two) times daily. 10 capsule 0   lidocaine-prilocaine (EMLA) cream Apply to affected area once 30 g 3   metoprolol tartrate (LOPRESSOR) 50 MG tablet Take 1 tablet (50 mg total) by mouth 2 (two) times daily. 60 tablet 0   morphine (MS CONTIN) 30 MG 12 hr tablet Take 1 tablet (30 mg total) by mouth every 12 (twelve) hours. 20 tablet 0   morphine (MSIR) 15 MG tablet Take 1 tablet (15 mg total) by mouth every 4 (four) hours as needed for moderate pain. 30 tablet 0   Omeprazole-Sodium Bicarbonate (ZEGERID)  20-1100 MG CAPS capsule Take 1 capsule by mouth daily before breakfast. 90 each 3   polyethylene glycol (MIRALAX / GLYCOLAX) 17 g packet Take 17 g by mouth 2 (two) times daily. 14 each 0   senna-docusate (SENOKOT-S) 8.6-50 MG tablet Take 2 tablets by mouth 2 (two) times daily. 30 tablet 0   No current facility-administered medications for this visit.      OBJECTIVE: Young African-American woman examined in a wheelchair There were no vitals filed for this visit.   There is no height or weight on file to calculate BMI.   Wt Readings from Last 3 Encounters:  05/07/19 175 lb 4.3 oz (79.5 kg)  04/23/19 172 lb 14.4 oz (78.4 kg)  04/14/19 181 lb 6.4 oz (82.3 kg)   ECOG FS:3 - Symptomatic, >50% confined to bed  Pulse ox today was 86% at rest on room air . Patient has just undergone drainage through her Pleurx.  LAB RESULTS:  CMP     Component Value Date/Time   NA 145 05/12/2019 0307   K 4.3 05/12/2019 0307   CL 108 05/12/2019 0307   CO2 24 05/12/2019 0307   GLUCOSE 101 (H) 05/12/2019 0307   BUN 9 05/12/2019 0307   CREATININE 0.87 05/12/2019 0307   CREATININE 0.73 02/11/2019 0859   CALCIUM 7.2 (L) 05/12/2019 0307   PROT 6.1 (L) 05/06/2019 0426   ALBUMIN 2.3 (L) 05/06/2019 0426   AST 13 (L) 05/06/2019 0426   AST 6 (L) 02/11/2019 0859   ALT 18 05/06/2019 0426   ALT 20 02/11/2019 0859   ALKPHOS 118 05/06/2019 0426   BILITOT 0.4 05/06/2019 0426   BILITOT 0.3 02/11/2019 0859   GFRNONAA >60 05/12/2019 0307   GFRNONAA >60 02/11/2019 0859   GFRAA >60 05/12/2019 0307   GFRAA >60 02/11/2019 0859    No results found for: TOTALPROTELP, ALBUMINELP, A1GS, A2GS, BETS, BETA2SER, GAMS, MSPIKE, SPEI  No results found for: KPAFRELGTCHN, LAMBDASER, KAPLAMBRATIO  Lab Results  Component Value Date   WBC 14.4 (H) 05/12/2019   NEUTROABS 5.3 05/08/2019   HGB 10.5 (L) 05/12/2019   HCT 35.7 (L) 05/12/2019   MCV 82.8 05/12/2019   PLT 582 (H) 05/12/2019    '@LASTCHEMISTRY'$ @  No results  found for: LABCA2  No components found for: RDEYCX448  No results for input(s): INR in the last 168 hours.  No results found for: LABCA2  No results found for: JEH631  No results found for: SHF026  No results found for: VZC588  Lab Results  Component Value Date   CA2729 17.6 04/02/2019    No components found for: HGQUANT  No results found for: CEA1 / No results found for: CEA1   No results found for: AFPTUMOR  No results found for: [place]  No results found for: PSA1  No results displayed because visit has over 200  results.      (this displays the last labs from the last 3 days)  No results found for: TOTALPROTELP, ALBUMINELP, A1GS, A2GS, BETS, BETA2SER, GAMS, MSPIKE, SPEI (this displays SPEP labs)  No results found for: KPAFRELGTCHN, LAMBDASER, KAPLAMBRATIO (kappa/lambda light chains)  No results found for: HGBA, HGBA2QUANT, HGBFQUANT, HGBSQUAN (Hemoglobinopathy evaluation)   No results found for: LDH  No results found for: IRON, TIBC, IRONPCTSAT (Iron and TIBC)  No results found for: FERRITIN  Urinalysis    Component Value Date/Time   COLORURINE YELLOW 05/12/2019 [place] 05/12/2019 0724   LABSPEC 1.012 05/12/2019 0724   PHURINE 7.0 05/12/2019 0724   GLUCOSEU NEGATIVE 05/12/2019 [place] 05/12/2019 [place] 05/12/2019 0724   BILIRUBINUR negative 05/31/2018 1011   KETONESUR NEGATIVE 05/12/2019 0724   PROTEINUR NEGATIVE 05/12/2019 0724   UROBILINOGEN 0.2 05/31/2018 1011   UROBILINOGEN 0.2 05/18/2010 1042   NITRITE NEGATIVE 05/12/2019 0724   LEUKOCYTESUR TRACE (A) 05/12/2019 0724     STUDIES:  Dg Chest 2 View  Result Date: 05/02/2019 CLINICAL DATA:  Fever, RIGHT-side pleural drainage catheter placed yesterday EXAM: CHEST - 2 VIEW COMPARISON:  04/25/2019 Correlation: CTA chest 04/23/2019 FINDINGS: RIGHT jugular Port-A-Cath with tip projecting over SVC. New RIGHT PleurX catheter. Enlargement of  cardiac silhouette. Known RIGHT infrahilar mass incompletely visualized due to partial obscuration by moderate to large RIGHT pleural effusion and basilar atelectasis. LEFT lung clear. No pneumothorax. Bone destruction of the anterior RIGHT fourth and fifth ribs. IMPRESSION: Enlargement of cardiac silhouette. Known RIGHT perihilar mass with bone destruction of the anterior RIGHT fourth and fifth ribs. Large RIGHT pleural effusion and significant basilar atelectasis. Electronically Signed   By: Lavonia Dana M.D.   On: 05/02/2019 08:52   Dg Chest 2 View  Result Date: 04/23/2019 CLINICAL DATA:  Shortness of breath, tachycardia and lethargy. History of metastatic breast cancer. EXAM: CHEST - 2 VIEW COMPARISON:  Chest x-ray 04/16/2019 FINDINGS: The power port is stable. Persistent right lower lobe process which appears to be a combination effusion and atelectasis based on prior chest CT. There is also persistent streaky left basilar atelectasis. IMPRESSION: 1. Stable/persistent right lower lobe process likely combination of effusion and atelectasis. 2. Stable streaky left basilar atelectasis. Electronically Signed   By: Marijo Sanes M.D.   On: 04/23/2019 11:27   Dg Chest 2 View  Result Date: 04/16/2019 CLINICAL DATA:  Right flank and chest pain as well as right shoulder pain. Shortness of breath and weakness. Recent diagnosis of right breast cancer. EXAM: CHEST - 2 VIEW COMPARISON:  04/13/2019 and 11/14/2018 FINDINGS: Right IJ Port-A-Cath unchanged. Persistent moderate size right pleural effusion likely with associated right basilar atelectasis. Left lung is clear. Mild stable cardiomegaly. Remainder of the exam is unchanged. IMPRESSION: Stable moderate size right pleural effusion likely with associated right basilar compressive atelectasis. Mild stable cardiomegaly. Electronically Signed   By: Marin Olp M.D.   On: 04/16/2019 16:39   Dg Chest 2 View  Result Date: 04/13/2019 CLINICAL DATA:  48 year old  with RIGHT mastectomy on 03/04/2019 for breast cancer, currently being admitted due to a RIGHT pleural effusion. EXAM: CHEST - 2 VIEW 5:30 p.m.: COMPARISON:  Portable chest x-ray earlier same day at 12:43 a.m. and previous chest x-rays. CT chest 04/11/2019 and earlier. FINDINGS: AP SEMI-ERECT and LATERAL images were obtained. Cardiac silhouette mildly enlarged even allowing for AP technique, unchanged. Large RIGHT pleural effusion with a subpulmonic component, unchanged since the examination earlier  today, increased in size since yesterday. Associated passive atelectasis in the RIGHT LOWER LOBE. Mild linear atelectasis in the LEFT LOWER LOBE as noted on the recent CT. No new pulmonary parenchymal abnormalities. RIGHT jugular Port-A-Cath tip at or near the cavoatrial junction. Post surgical changes related to RIGHT mastectomy and axillary node removal. IMPRESSION: 1. Large RIGHT pleural effusion with a subpulmonic component, increased in size since yesterday. Associated passive atelectasis in the RIGHT LOWER LOBE. 2. Mild linear atelectasis in the LEFT LOWER LOBE as noted on the recent CT. 3. No new abnormalities. Electronically Signed   By: Evangeline Dakin M.D.   On: 04/13/2019 18:11   Ct Head Wo Contrast  Result Date: 05/02/2019 CLINICAL DATA:  Altered mental status, metastatic breast cancer EXAM: CT HEAD WITHOUT CONTRAST TECHNIQUE: Contiguous axial images were obtained from the base of the skull through the vertex without intravenous contrast. COMPARISON:  05/17/2010 FINDINGS: Brain: No evidence of acute infarction, hemorrhage, hydrocephalus, extra-axial collection or mass lesion/mass effect. Periventricular and deep white matter hypodensity. Vascular: No hyperdense vessel or unexpected calcification. Skull: Normal. Negative for fracture or focal lesion. Sinuses/Orbits: No acute finding. Other: None. IMPRESSION: No acute intracranial pathology. Periventricular and deep white matter hypodensity which is  advanced for patient age although may nonetheless reflect small-vessel white matter disease. No noncontrast CT evidence of intracranial metastatic disease. Consider contrast enhanced MRI to more sensitively evaluate for metastatic disease, if suspected. Electronically Signed   By: Eddie Candle M.D.   On: 05/02/2019 16:40   Ct Angio Chest Pe W/cm &/or Wo Cm  Result Date: 04/23/2019 CLINICAL DATA:  Shortness of breath, history of metastatic breast cancer EXAM: CT ANGIOGRAPHY CHEST WITH CONTRAST TECHNIQUE: Multidetector CT imaging of the chest was performed using the standard protocol during bolus administration of intravenous contrast. Multiplanar CT image reconstructions and MIPs were obtained to evaluate the vascular anatomy. CONTRAST:  122m OMNIPAQUE IOHEXOL 350 MG/ML SOLN COMPARISON:  04/11/2019 FINDINGS: Cardiovascular: Satisfactory opacification of the pulmonary arteries to the segmental level. No evidence of pulmonary embolism. Normal heart size. No pericardial effusion. Right chest port catheter. Mediastinum/Nodes: No enlarged mediastinal, hilar, or axillary lymph nodes. Thyroid gland, trachea, and esophagus demonstrate no significant findings. Lungs/Pleura: Moderate to large right pleural effusion and associated atelectasis or consolidation, slightly increased compared to prior examination. There are multiple small bilateral pulmonary nodules, which are new or enlarged compared to prior examination, for example 5 mm nodule of the left upper lobe (series 10, image 37). Upper Abdomen: No acute abnormality. Musculoskeletal: Status post right mastectomy. There is a redemonstrated destructive soft tissue lesion of the anterior right chest wall and pectoral musculature, which is difficult to distinguish from adjacent pleural fluid and mostly appreciated by the destructive effect on the right anterior fourth rib. There has been interval enlargement of adjacent soft tissue nodularity of the chest wall (e.g.  Series 5, image 166) Review of the MIP images confirms the above findings. IMPRESSION: 1.  Negative examination for pulmonary embolism. 2. Moderate to large right pleural effusion and associated atelectasis or consolidation, slightly increased compared to prior examination. 3. There are multiple small bilateral pulmonary nodules, which are new or enlarged compared to prior examination, for example 5 mm nodule of the left upper lobe (series 10, image 37). Findings are most consistent with pulmonary metastatic disease, although atypical infection is not excluded given very rapid interval development in comparison to CT dated 04/11/2019. 4. Status post right mastectomy. There is a redemonstrated destructive soft tissue lesion of the  anterior right chest wall and pectoral musculature, which is difficult to distinguish from adjacent pleural fluid and mostly appreciated by the destructive effect on the right anterior fourth rib. There has been interval enlargement of adjacent soft tissue nodularity of the chest wall (e.g. Series 5, image 166) Electronically Signed   By: Eddie Candle M.D.   On: 04/23/2019 15:22   Ct Biopsy  Result Date: 04/24/2019 CLINICAL DATA:  History of breast carcinoma. Enlarging anterior right chest wall mass. EXAM: CT GUIDED CORE BIOPSY OF RIGHT ANTERIOR CHEST WALL MASS ANESTHESIA/SEDATION: Intravenous Fentanyl 79mg and Versed 172mwere administered as conscious sedation during continuous monitoring of the patient's level of consciousness and physiological / cardiorespiratory status by the radiology RN, with a total moderate sedation time of 10 minutes. PROCEDURE: The procedure risks, benefits, and alternatives were explained to the patient. Questions regarding the procedure were encouraged and answered. The patient understands and consents to the procedure. Select axial scans through the lower chest were obtained. The right anterior chest wall mass was localized and an appropriate skin entry  site was determined and marked. The operative field was prepped with chlorhexidinein a sterile fashion, and a sterile drape was applied covering the operative field. A sterile gown and sterile gloves were used for the procedure. Local anesthesia was provided with 1% Lidocaine. Under CT fluoroscopic guidance, a 17 gauge trocar needle was advanced to the margin of the lesion. Once needle tip position was confirmed, coaxial 18-gauge core biopsy samples were obtained, submitted in formalin to surgical pathology. The guide needle was removed. Postprocedure scans show no hemorrhage or other apparent complication. The patient tolerated the procedure well. COMPLICATIONS: None immediate FINDINGS: Right anterior chest wall mass was localized. Representative core biopsy samples obtained as above. IMPRESSION: 1. Technically successful CT-guided core biopsy, right anterior chest wall mass. Electronically Signed   By: D Lucrezia Europe.D.   On: 04/24/2019 14:27   Dg Chest Port 1 View  Result Date: 05/10/2019 CLINICAL DATA:  Shortness of breath. EXAM: PORTABLE CHEST 1 VIEW COMPARISON:  Radiograph 05/05/2019, additional priors. FINDINGS: Accessed right chest port remains in place. Right PleurX catheter tip at the apex. Large pleural effusion has decreased from prior exam with some improving aeration of the right lung apex. Similar leftward mediastinal shift to before. Left lung is grossly clear. No pneumothorax or other change from prior exam. IMPRESSION: Right PleurX catheter in place with persistent but diminishing large right pleural effusion. Improving right apical aeration. Otherwise unchanged exam. Electronically Signed   By: MeKeith Rake.D.   On: 05/10/2019 22:22   Dg Chest Port 1 View  Result Date: 05/05/2019 CLINICAL DATA:  Sepsis. EXAM: PORTABLE CHEST 1 VIEW COMPARISON:  Radiographs of May 02, 2019. FINDINGS: Stable cardiomediastinal silhouette. Right-sided chest tube is unchanged in position. Right internal  jugular Port-A-Cath is unchanged. Large right pleural effusion is noted which is significantly increased in size compared to prior exam. Underlying atelectasis or infiltrate cannot be excluded. Left lung is clear. Bony thorax is unremarkable. No pneumothorax is noted. IMPRESSION: Large right pleural effusion is noted which is enlarged compared to prior exam. Stable position of right-sided chest tube. Electronically Signed   By: JaMarijo Conception.D.   On: 05/05/2019 09:17   Dg Chest Port 1 View  Result Date: 04/25/2019 CLINICAL DATA:  Fever EXAM: PORTABLE CHEST 1 VIEW COMPARISON:  April 23, 2019 FINDINGS: The mediastinal contour and cardiac silhouette are stable. Right central venous line is unchanged. There is a large  right pleural effusion with consolidation of the right mid and lower lung worsened compared prior exam. The left lung is clear. The bony structures are stable. IMPRESSION: Large right pleural effusion with consolidation of the right mid and lower lung worse compared to prior exam. Electronically Signed   By: Abelardo Diesel M.D.   On: 04/25/2019 19:49   Ir Perc Pleural Drain W/indwell Cath W/img Guide  Result Date: 05/01/2019 CLINICAL DATA:  History of metastatic breast cancer, now with recurrent symptomatic malignant right-sided pleural effusion. Request now made for placement of a right-sided tunneled pleural catheter for palliative purposes. EXAM: INSERTION OF TUNNELED RIGHT SIDED PLEURAL DRAINAGE CATHETER COMPARISON:  Ultrasound-guided thoracentesis-04/24/2019; 04/12/2019; chest CTA-04/23/2019 MEDICATIONS: Ancef 2 gm IV; Antibiotic was administered in an appropriate time interval for the procedure. ANESTHESIA/SEDATION: Moderate (conscious) sedation was employed during this procedure. A total of Versed 4 mg and Fentanyl 200 mcg was administered intravenously. Moderate Sedation Time: 16 minutes. The patient's level of consciousness and vital signs were monitored continuously by radiology nursing  throughout the procedure under my direct supervision. FLUOROSCOPY TIME:  FLUOROSCOPY TIME 12 seconds (3 mGy) COMPLICATIONS: None immediate. PROCEDURE: The procedure, risks, benefits, and alternatives were explained to the patient, who wish to proceed with the placement of this permanent pleural catheter as the patient is seeking palliative care. The patient understand and consent to the procedure. The right inferior lateral chest and upper abdomen were prepped with Chlorhexidine in a sterile fashion, and a sterile drape was applied covering the operative field. Attention was made to avoid the patient's known right anterior chest wall mass. A sterile gown and sterile gloves were used for the procedure. Initial ultrasound scanning and fluoroscopic imaging demonstrates a recurrent moderate to large pleural effusion. Under direct ultrasound guidance, the right inferior lateral pleural space was accessed with a Yueh sheath needle after the overlying soft tissues were anesthetized with 1% lidocaine with epinephrine. An Amplatz super stiff wire was then advanced under fluoroscopy into the pleural space. A 15.5 French tunneled Pleur-X catheter was tunneled from an incision within the right upper abdominal quadrant to the access site. The pleural access site was serially dilated under fluoroscopy, ultimately allowing placement of a peel-away sheath. The catheter was advanced through the peel-away sheath. The sheath was then removed. Final catheter positioning was confirmed with a fluoroscopic radiographic image. The access incision was closed with subcutaneous subcuticular 4-0 Vicryl, Dermabond and Steri-Strips. A Prolene retention suture was applied at the catheter exit site. Large volume thoracentesis was performed through the new catheter utilizing provided bulb vacuum assisted drainage bag. The patient tolerated the above procedure well without immediate postprocedural complication. FINDINGS: Preprocedural ultrasound  scanning demonstrates a recurrent large sized anechoic right sided pleural effusion. After ultrasound and fluoroscopic guided placement, the catheter is directed right lung apex Following catheter placement, approximately 1.3 L of bloody pleural fluid was removed. IMPRESSION: Successful placement of permanent, tunneled right pleural drainage catheter via lateral approach. 1.3 liters of bloody pleural fluid was removed after catheter placement. Electronically Signed   By: Sandi Mariscal M.D.   On: 05/01/2019 15:42   US Thoracentesis Asp Pleural Space W/img Guide  Result Date: 04/24/2019 INDICATION: Patient with history of right breast cancer, dyspnea, and recurrent right pleural effusion. Request is made for diagnostic and therapeutic right thoracentesis. EXAM: ULTRASOUND GUIDED DIAGNOSTIC AND THERAPEUTIC RIGHT THORACENTESIS MEDICATIONS: 10 mL 1% lidocaine 1 mg morphine IV COMPLICATIONS: None immediate. PROCEDURE: An ultrasound guided thoracentesis was thoroughly discussed with the patient and questions  answered. The benefits, risks, alternatives and complications were also discussed. The patient understands and wishes to proceed with the procedure. Written consent was obtained. Ultrasound was performed to localize and mark an adequate pocket of fluid in the right chest. The area was then prepped and draped in the normal sterile fashion. 1% Lidocaine was used for local anesthesia. Under ultrasound guidance a 6 Fr Safe-T-Centesis catheter was introduced. Thoracentesis was performed. The catheter was removed and a dressing applied. FINDINGS: A total of approximately 650 mL of dark red fluid was removed. Samples were sent to the laboratory as requested by the clinical team. IMPRESSION: Successful ultrasound guided right thoracentesis yielding 650 mL of pleural fluid. Read by: Earley Abide, PA-C No pneumothorax on follow-up CT. Electronically Signed   By: Lucrezia Europe M.D.   On: 04/24/2019 12:52     ELIGIBLE FOR  AVAILABLE RESEARCH PROTOCOL: Declined   ASSESSMENT: 48 y.o. Fernand Parkins, [place] woman status post right breast lower outer quadrant biopsy 10/29/2018 for a clinical T2 N0, stage IIB invasive ductal carcinoma, grade 3, triple negative, with an MIB-1 of 80%  (a) CT of the chest 11/15/2018 shows possible liver metastases, but liver biopsy 11/25/2018 consistent with hemangioma  (b) bone scan 11/15/2018 shows no suspicious bone lesions  (1) neoadjuvant chemotherapy consisting of doxorubicin and cyclophosphamide in dose dense fashion x4, started 11/26/2018, completed 01/07/2019, followed by weekly paclitaxel and carboplatin x12 starting 01/28/2019  (2) patient desires ovarian function preservation:   (a) Zoladex started 11/08/2018, discontinued 02/04/2019  (b) patient is status post bilateral tubal ligation  (3)status post right mastectomy with sentinel lymph node sampling 03/04/2019 for aypTac ypN1ainvasive ductal carcinoma grade 3, repeat prognostic panel again triple negative (a)a total of 6 axillary lymph nodes removed, 2 sentinel ones, 1positive  (4) adjuvant radiationto be considered  (5) genetics counseling on 11/28/2018, testing was recommended, but declined by patient.  METASTATIC DISEASE: July 2020 (6)Staging studies: (a) chest CT scan and bone scan 11/15/2018 show a liver mass, no lung or bone lesions (b) liver biopsy 11/25/2018 shows a hemangioma (c)chest CT Angio 03/26/2019 again shows liver mass, right breast "seroma"             (d) cytology from R thoracentesis 04/12/2019 nondiagnostic             (e) CT angio 04/23/2019 shows recurrent right effusion, multiple bilateral pulmonary nodules, invasive anterior right chest wall lesion  (f) biopsy of the chest wall lesion on 04/24/2019 and cytology from the right pleural effusion the same day confirms metastatic breast cancer  (g) CARIS study results pending  (7)  pembrolizumabstarted 04/02/2019, to be repeated every 21 days  (8) carboplatin and gemcitabine started 05/13/2019, to be repeated days 1 and 8 of each 21-day cycle  (9) pain control:  (a) currently on MS Contin '30mg'$  twice daily with MSIR 15 mg as needed  (b) so far no need for bowel prophylaxis   PLAN: Brittany Rodgers has a very poor functional status at this point.  I am hopeful that once she is on a regular Pleurx drainage protocol, usually once daily, and we can get oxygen for her at home, things may improve some.  She is doing well as far as pain is controlled and I am making no changes in her morphine.  I am not sure what the status is with her relationship to her husband.  My understanding is that he tried to learn how to do the Pleurx drainage and simply was not capable of doing  it (became nauseated and vomited).  She is getting significant support from her sisters and they are very insistent that she needs to be home, meaning not in the hospital and also not in a hospice home.  We went over some of the chemotherapy issues today.  They understand that this may control the cancer and may help her body not produce so much fluid.  On the other hand it also can cause problems with her immune system and nausea.  She will have Compazine for nausea and we are bringing her back tomorrow for supportive care with fluids and also for another drainage to make sure the family knows exactly how to do the drain is in a sterile fashion.  [name] will then return a week from today to receive her second dose of chemo.  They understand that if this chemotherapy does not work I will recommend hospice.  They now to call for any other issue that may develop before the next visit.  Chauncey Cruel, MD Medical Oncology and Hematology [facility] [address] [place], [place] [address] Tel. [phone]    Fax. [phone]   I, Wilburn Mylar, am acting as scribe for Dr. [name].  Jethro Radke.  I, Lurline Del MD, have reviewed the above documentation for accuracy and completeness, and I agree with the above.

## 2019-05-12 ENCOUNTER — Telehealth: Payer: Self-pay | Admitting: *Deleted

## 2019-05-12 LAB — BODY FLUID CELL COUNT WITH DIFFERENTIAL
Eos, Fluid: 1 %
Lymphs, Fluid: 21 %
Monocyte-Macrophage-Serous Fluid: 64 % (ref 50–90)
Neutrophil Count, Fluid: 14 % (ref 0–25)
Total Nucleated Cell Count, Fluid: 555 cu mm (ref 0–1000)

## 2019-05-12 LAB — BASIC METABOLIC PANEL
Anion gap: 13 (ref 5–15)
BUN: 9 mg/dL (ref 6–20)
CO2: 24 mmol/L (ref 22–32)
Calcium: 7.2 mg/dL — ABNORMAL LOW (ref 8.9–10.3)
Chloride: 108 mmol/L (ref 98–111)
Creatinine, Ser: 0.87 mg/dL (ref 0.44–1.00)
GFR calc Af Amer: >60 mL/min (ref >60)
GFR calc non Af Amer: >60 mL/min (ref >60)
Glucose, Bld: 101 mg/dL — ABNORMAL HIGH (ref 70–99)
Potassium: 4.3 mmol/L (ref 3.5–5.1)
Sodium: 145 mmol/L (ref 135–145)

## 2019-05-12 LAB — CBC
HCT: 35.7 % — ABNORMAL LOW (ref 36.0–46.0)
Hemoglobin: 10.5 g/dL — ABNORMAL LOW (ref 12.0–15.0)
MCH: 24.4 pg — ABNORMAL LOW (ref 26.0–34.0)
MCHC: 29.4 g/dL — ABNORMAL LOW (ref 30.0–36.0)
MCV: 82.8 fL (ref 80.0–100.0)
Platelets: 582 10*3/uL — ABNORMAL HIGH (ref 150–400)
RBC: 4.31 MIL/uL (ref 3.87–5.11)
RDW: 20.4 % — ABNORMAL HIGH (ref 11.5–15.5)
WBC: 14.4 10*3/uL — ABNORMAL HIGH (ref 4.0–10.5)
nRBC: 0.1 % (ref 0.0–0.2)

## 2019-05-12 LAB — PATHOLOGIST SMEAR REVIEW

## 2019-05-12 LAB — GRAM STAIN

## 2019-05-12 LAB — URINALYSIS, ROUTINE W REFLEX MICROSCOPIC
Bilirubin Urine: NEGATIVE
Glucose, UA: NEGATIVE mg/dL
Hgb urine dipstick: NEGATIVE
Ketones, ur: NEGATIVE mg/dL
Nitrite: NEGATIVE
Protein, ur: NEGATIVE mg/dL
Specific Gravity, Urine: 1.012 (ref 1.005–1.030)
pH: 7 (ref 5.0–8.0)

## 2019-05-12 MED ORDER — POLYETHYLENE GLYCOL 3350 17 G PO PACK: 17.0000 g | PACK | Freq: Two times a day (BID) | ORAL | 0 refills | Status: AC

## 2019-05-12 MED ORDER — MORPHINE SULFATE 15 MG PO TABS: 15.0000 mg | ORAL_TABLET | ORAL | 0 refills | Status: DC | PRN

## 2019-05-12 MED ORDER — METOPROLOL TARTRATE 50 MG PO TABS: 50.0000 mg | ORAL_TABLET | Freq: Two times a day (BID) | ORAL | 0 refills | Status: AC

## 2019-05-12 MED ORDER — HEPARIN SOD (PORK) LOCK FLUSH 100 UNIT/ML IV SOLN
500.0000 [IU] | INTRAVENOUS | Status: AC | PRN
  Administered 2019-05-12: 11:00:00 500 [IU]

## 2019-05-12 MED ORDER — SENNOSIDES-DOCUSATE SODIUM 8.6-50 MG PO TABS: 2.0000 | ORAL_TABLET | Freq: Two times a day (BID) | ORAL | 0 refills | Status: AC

## 2019-05-12 MED ORDER — BENZONATATE 100 MG PO CAPS: 100.0000 mg | ORAL_CAPSULE | Freq: Three times a day (TID) | ORAL | 0 refills | Status: AC | PRN

## 2019-05-12 MED ORDER — DOCUSATE SODIUM 100 MG PO CAPS: 200.0000 mg | ORAL_CAPSULE | Freq: Two times a day (BID) | ORAL | 0 refills | Status: AC

## 2019-05-12 MED ORDER — MORPHINE SULFATE ER 30 MG PO TBCR: 30.0000 mg | EXTENDED_RELEASE_TABLET | Freq: Two times a day (BID) | ORAL | 0 refills | Status: DC

## 2019-05-12 NOTE — Progress Notes (Signed)
Patient's MEWS score continues to stay in the yellow.  This has been typical of patient over the past few days.  MD is already aware.Brittany Rodgers

## 2019-05-12 NOTE — Progress Notes (Signed)
Went over d/c instructions and appointments with patient and her husband.  Both verbalized understanding.  [name] said that pleurex drainage could be performed in office.  Patient left with husband via w/c.  Virginia Rochester, RN

## 2019-05-12 NOTE — Progress Notes (Signed)
Patient attempted to perform pleurex drainage herself.  Observed by two RNs.   Patient still needs much supervised practice to safely perform Pleurex drain herself.  [name] also observed.  Next outpatient drainage planned for in office.  Virginia Rochester, RN

## 2019-05-12 NOTE — Progress Notes (Signed)
[place]   DOB:07/23/1971   JG#:[id number]   MLY#:[id number]  Subjective:  Observed [name] draining ascitic fluid. She is very comfortable trying but has difficulty understanding sterility. She will need additional training. However that should not delay her discharge (discussed below). Nurses in room  Objective: young African American woman sitting up in bed Vitals:   05/11/19 2259 05/12/19 0521  BP: (!) 138/94 (!) 144/95  Pulse: (!) 110 (!) 110  Resp: (!) 32 (!) 28  Temp:  97.6 F (36.4 C)  SpO2: 100% 99%    Body mass index is 32.06 kg/m.  Intake/Output Summary (Last 24 hours) at 05/12/2019 0730 Last data filed at 05/11/2019 1300 Gross per 24 hour  Intake 480 ml  Output -  Net 480 ml     CBG (last 3)  No results for input(s): GLUCAP in the last 72 hours.   Labs:  Lab Results  Component Value Date   WBC 14.4 (H) 05/12/2019   HGB 10.5 (L) 05/12/2019   HCT 35.7 (L) 05/12/2019   MCV 82.8 05/12/2019   PLT 582 (H) 05/12/2019   NEUTROABS 5.3 05/08/2019    @LASTCHEMISTRY @  Urine Studies No results for input(s): UHGB, CRYS in the last 72 hours.  Invalid input(s): UACOL, UAPR, USPG, UPH, UTP, UGL, UKET, UBIL, UNIT, UROB, [place], UEPI, UWBC, Junie Panning [place], [place], [place]  Basic Metabolic Panel: Recent Labs  Lab 05/06/19 0426  05/08/19 0339 05/09/19 0452 05/09/19 1303 05/10/19 0948 05/12/19 0307  NA 140   < > 144 146* 143 144 145  K 3.5   < > 3.1* 3.2* 3.3* 3.4* 4.3  CL 105   < > 109 114* 109 110 108  CO2 24   < > 21* 21* 21* 21* 24  GLUCOSE 112*   < > 131* 117* 150* 127* 101*  BUN 6   < > 5* 6 6 <5* 9  CREATININE 0.56   < > 0.65 0.75 0.73 0.80 0.87  CALCIUM 6.7*   < > 6.6* 6.6* 6.5* 6.7* 7.2*  PHOS 1.2*  --   --  1.7* 3.7  --   --    < > = values in this interval not displayed.   GFR Estimated Creatinine Clearance: 76.4 mL/min (by C-G formula based on SCr of 0.87 mg/dL). Liver Function Tests: Recent Labs  Lab 05/06/19 0426  AST 13*  ALT 18   ALKPHOS 118  BILITOT 0.4  PROT 6.1*  ALBUMIN 2.3*   No results for input(s): LIPASE, AMYLASE in the last 168 hours. No results for input(s): AMMONIA in the last 168 hours. Coagulation profile No results for input(s): INR, PROTIME in the last 168 hours.  CBC: Recent Labs  Lab 05/06/19 0426 05/07/19 0600 05/08/19 0339 05/10/19 0948 05/12/19 0307  WBC 6.7 7.7 8.5 10.9* 14.4*  NEUTROABS 4.5 5.2 5.3  --   --   HGB 8.6* 8.9* 9.3* 9.3* 10.5*  HCT 28.7* 30.7* 31.2* 31.8* 35.7*  MCV 84.2 83.4 82.8 83.5 82.8  PLT 333 388 445* 464* 582*   Cardiac Enzymes: No results for input(s): CKTOTAL, CKMB, CKMBINDEX, TROPONINI in the last 168 hours. BNP: Invalid input(s): POCBNP CBG: No results for input(s): GLUCAP in the last 168 hours. D-Dimer No results for input(s): DDIMER in the last 72 hours. Hgb A1c No results for input(s): HGBA1C in the last 72 hours. Lipid Profile No results for input(s): CHOL, HDL, LDLCALC, TRIG, CHOLHDL, LDLDIRECT in the last 72 hours. Thyroid function studies No results for input(s):  TSH, T4TOTAL, T3FREE, THYROIDAB in the last 72 hours.  Invalid input(s): FREET3 Anemia work up No results for input(s): VITAMINB12, FOLATE, FERRITIN, TIBC, IRON, RETICCTPCT in the last 72 hours. Microbiology Recent Results (from the past 240 hour(s))  Culture, blood (routine x 2)     Status: None   Collection Time: 05/02/19  7:44 AM   Specimen: BLOOD  Result Value Ref Range Status   Specimen Description   Final    BLOOD LEFT ARM Performed at [facility] [address] [place], [place] [address]    Special Requests   Final    BOTTLES DRAWN AEROBIC AND ANAEROBIC Blood Culture adequate volume Performed at [facility] [address] [place], [place] [address]    Culture   Final    NO GROWTH 5 DAYS Performed at [facility], [place] [address] [place], [place] [address]    Report Status 05/07/2019 FINAL  Final  Culture, blood  (routine x 2)     Status: None   Collection Time: 05/02/19  7:44 AM   Specimen: BLOOD LEFT HAND  Result Value Ref Range Status   Specimen Description   Final    BLOOD LEFT HAND Performed at [facility] [address], [place], [place] [address]    Special Requests   Final    BOTTLES DRAWN AEROBIC AND ANAEROBIC Blood Culture adequate volume Performed at [facility] [address] [place], [place] [address]    Culture   Final    NO GROWTH 5 DAYS Performed at [facility], [place] [address] [place], [place] [address]    Report Status 05/07/2019 FINAL  Final  Culture, blood (Routine X 2) w Reflex to ID Panel     Status: None   Collection Time: 05/05/19 10:03 AM   Specimen: BLOOD  Result Value Ref Range Status   Specimen Description   Final    BLOOD LEFT ANTECUBITAL Performed at [facility], [place] [address], [place], [place] [address]    Special Requests   Final    BOTTLES DRAWN AEROBIC AND ANAEROBIC Blood Culture adequate volume Performed at [facility] [address], [place], [place] [address]    Culture   Final    NO GROWTH 5 DAYS Performed at [facility], [place] [address] [place], [place] [address]    Report Status 05/10/2019 FINAL  Final  Culture, blood (Routine X 2) w Reflex to ID Panel     Status: None   Collection Time: 05/05/19 10:12 AM   Specimen: BLOOD LEFT HAND  Result Value Ref Range Status   Specimen Description   Final    BLOOD LEFT HAND Performed at [facility] [address] [place], [place] [address]    Special Requests   Final    BOTTLES DRAWN AEROBIC ONLY Blood Culture adequate volume Performed at [facility] [address] [place], [place] [address]    Culture   Final    NO GROWTH 5 DAYS Performed at [facility], [place] [address], [place], [place] [address]    Report Status 05/10/2019 FINAL  Final  Body fluid culture      Status: None   Collection Time: 05/06/19  1:58 PM   Specimen: Pleura; Body Fluid  Result Value Ref Range Status   Specimen Description   Final    PLEURAL Performed at [facility], [address] [place], [place] [address]    Special Requests   Final    NONE Performed at [facility], [place] [address], [place], [place] [address]    Gram Stain   Final    ABUNDANT WBC PRESENT,BOTH PMN AND MONONUCLEAR NO ORGANISMS SEEN    Culture   Final    NO GROWTH Performed at [facility], [place] [address], [place], [place] [address]    Report Status 05/09/2019 FINAL  Final      Studies:  Dg Chest Port 1 View  Result Date: 05/10/2019 CLINICAL DATA:  Shortness of breath. EXAM: PORTABLE CHEST 1 VIEW COMPARISON:  Radiograph 05/05/2019, additional priors. FINDINGS: Accessed right chest port remains in place. Right PleurX catheter tip at the apex. Large pleural effusion has decreased from prior exam with some improving aeration of the right lung apex. Similar leftward mediastinal shift to before. Left lung is grossly clear. No pneumothorax or other change from prior exam. IMPRESSION: Right PleurX catheter in place with persistent but diminishing large right pleural effusion. Improving right apical aeration. Otherwise unchanged exam. Electronically Signed   By: Keith Rake M.D.   On: 05/10/2019 22:22    Assessment: 48 y.o. Fernand Parkins, [place] woman status post right breast lower outer quadrant biopsy 10/29/2018 for a clinical T2 N0, stage IIB invasive ductal carcinoma, grade 3, triple negative, with an MIB-1 of 80%             (a) CT of the chest 11/15/2018 shows possible liver metastases, but liver biopsy 11/25/2018 consistent with hemangioma             (b) bone scan 11/15/2018 shows no suspicious bone lesions  (1) neoadjuvant chemotherapy consisting of doxorubicin and cyclophosphamide in dose dense fashion x4, started 11/26/2018, completed 01/07/2019,  followed by weekly paclitaxel and carboplatin x12 starting 01/28/2019  (2) patient desires ovarian function preservation:              (a) Zoladex started 11/08/2018, discontinued 02/04/2019             (b) patient is status post bilateral tubal ligation  (3) status post right mastectomy with sentinel lymph node sampling 03/04/2019 for a ypTac ypN1a invasive ductal carcinoma grade 3, repeat prognostic panel again triple negative             (a) a total of 6 axillary lymph nodes removed, 2 sentinel ones, 1 positive  (4) adjuvant radiation to be considered  (5) genetics counseling on 11/28/2018, testing was recommended, but declined by patient.  METASTATIC DISEASE: July 2020 (6) Staging studies:             (a) chest CT scan and bone scan 11/15/2018 show a liver mass, no lung or bone lesions             (b) liver biopsy 11/25/2018 shows a hemangioma             (c) chest CT Angio 03/26/2019 again shows liver mass, right breast "seroma"  (d) cytology from R thoracentesis 04/12/2019 nondiagnostic  (e) CT angio 04/23/2019 shows recurrent right effusion, multiple bilateral pulmonary nodules, invasive anterior right chest wall lesion  (7) pembrolizumab started 04/02/2019, to be repeated every 21 days  (8) CT angio 04/23/2019 shows new lung nodules, likely metastatic  (9) repeat thoracentesis 04/24/2019-- cytology positive  (10) biopsy R chest wall mass 04/24/2019 confirms recurrent/metastatic breast cancer  (a) CARIS study results pending  (11) to start carboplatin and gemcitabine 05/13/2019,  to be repeated days 1 and 8 of each 21-day cycle    Plan:  I think [name] may be sufficiently improved to go home. She will need HHN, and I understand there have been some delays regarding that because of insurance. However she just drained her ascites (with close nursing suprvision/prompting/corrections). She is scheduled for chemo tomorrow 08/18 and for IVF 08/19. We can do another abd  drainage at the cancer center 08/19 so if [facility] can see her 08/20 and plan on drainage every other day by the [facility] (until patient is entirely competent at the procedure, which may take some weeks).   Please let me know if I can be of further help.   Chauncey Cruel, MD 05/12/2019  7:30 AM Medical Oncology and Hematology [facility] [address] [place], [place] [address] Tel. [phone]    Fax. [phone]    ADDENDUM: [name] is generally better and I am very appreciative of the staff''s efforts on her behalf. As with many young people, it is difficult for her to grasp that she has chronic problems that are not going to go away and that require chronic ongoing treatment. She needs to take her medications and especially her bowel prophylaxis medications as prescribed. She will need to drain her pleurx at home every 48-72 hours (initially of course with HHN help). She is doing better on pain medications but occasionally she does become somewhat confused and this works against simple compliance.   She is scheduled for a visit and chemotherapy 05/13/2019 and I will see her that day.  Please let me know if we can be of further help at this point  Chauncey Cruel, MD Medical Oncology and Hematology [facility] [place], [place] [address] Tel. [phone]    Fax. [phone]

## 2019-05-12 NOTE — Discharge Summary (Addendum)
Physician Discharge Summary  [place] GEX:[id number] DOB: 10/30/1970 DOA: 04/23/2019  PCP: [name], MD  Admit date: 04/23/2019 Discharge date: 05/12/2019  Admitted From: Home  Disposition:  Home   Recommendations for Outpatient Follow-up:  1. Follow up with PCP in 1-2 weeks 2. Please obtain BMP/CBC in one week 3. She will require pleurex catheter drainage by her oncologist, until [facility] is able to be arrange.  4. Please follow pleural fluid culture from 8/17 5. Please follow WBC.   Home Health: yes  Discharge Condition: Stable.  CODE STATUS: full code Diet recommendation: Heart Healthy   Brief/Interim Summary: 48 year old female with past medical history significant for metastatic breast cancer invasive ductal carcinoma status post right mastectomy who presented to the ED on 7/29 2020 with complaints of increased shortness of breath and lethargy.  She had been seen on the day prior in the oncology clinic and was noted to have a new chest wall mass as well as liver mass.  Patient was being considered for possible biopsy as an outpatient.  Next day patient became increasingly [place] of breath and tachycardic.  She was direct to the ED for inpatient admission and management. In the ED patient was hypoxic and tachycardic up to 140 with a CT scan of the chest on admission showing findings worrisome for large right side pleural effusion.  Patient was admitted under hospitalist service for further evaluation and management.  Oncology and IR consult was obtained.  On 7/30, patient underwent thoracentesis and biopsy of chest wall mass.  Fluid cytology and biopsy of the mass positive for metastatic disease.  On 8/6 patient underwent Pleurx catheter placement on the right side.  I patient also develop intermittent shortness of breath and fever.  She was evaluated by infectious disease who agreed with covering with broad-spectrum antibiotic and awaiting culture results.  1-SIRS with acute  metabolic encephalopathy: Sepsis was ruled out. Patient on admission was a started on broad-spectrum antibiotics.  No source identified.  Antibiotics were stopped on 8/6.  Patient continued to spike intermittent high fever.  However white blood cell, lactic acid and procalcitonin level were trending down. On 8/8 patient was having intermittent fever she was getting more tachycardic and more drowsy.  A T-max of 103.  Infectious was work-up negative.  Seen by ID who suspected that cancer could be calcium fever.  Recommending continue antibiotics until pleural cultures available. I spoke with Dr. Graylon Good today review pleural fluid culture results and white count. Plan is to discontinue antibiotics and observe. Afebrile off antibiotics.  She has remained afebrile.. Leukocytosis today,  recent pleural fluid with and has less white blood cell.  Follow-up on cultures.  Metastatic  breast cancer invasive ductal carcinoma, status post right mastectomy.  Recent diagnosis of ductal carcinoma metastasis to the chest wall and right pleural effusion .  Patient followed by [name]. Patient had a Pleurx catheter placed on 8/6.  Apparently there was no order to drain the catheter.  Patient went 4 days without draining.  Order in place for drain Pleurx catheter every other day. Patient to be started on carboplatin Gemcitabine  possible next week Patient need to learn how to take care of drain. CM working on arranging [facility] Patient learning to take care of pleurex catheter  Patient will follow-up with her oncologist to have the Pleurx catheter drain, until she is able to learn.  Acute Respiratory distress; related to pleural effusion; Patient has been getting pleurex catheter every day.  Last  night she was notice to be very tachypnea, SOB, chest x ray showed increase size pleural effusion.  pleurex catheter was drain again last night. With resolution, improvement of symptoms.  Monitor overnight.  Patient has  required over the last 2 days drainage of her Pleurx catheter twice in the day.  She does not allows [name]  to drain more than 250 cc from the catheter.  She will follow-up with [name] for chemo on 8/18.  [name] will drain her catheter on daily basis.  Highly recommend to drain more than 250 cc, she might need to have at least 400 cc drained from the Pleurx catheter  Right axillary pain: At the site of metastasis to the anterior chest wall and pectoralis muscle. Continue with MS Contin  Sinus tachycardia: Improved for metoprolol.  Constipation; continue with stool softener and MiraLAX.  Hyperliipidemia: Statin discontinued due to liver mets Hypocalcemia.  Likely related to malignancy.  Patient received pamidronate as an outpatient.  Calcium level now low, stable follow.  Corrected calcium for albumin 7.6  Hypokalemia: oral supplement   Hypophosphatemia ; replaced.,  Hypernatremia; start D 5. Improved with fluids.  Stop IV fluids.    Discharge Diagnoses:  Principal Problem:   Malignant pleural effusion Active Problems:   IBS   Hyperlipidemia   Chronic right hip pain   Malignant neoplasm of lower-outer quadrant of right breast of female, estrogen receptor negative (HCC)   S/P mastectomy   Pleural effusion   Fever    Discharge Instructions  Discharge Instructions    Diet - low sodium heart healthy   Complete by: As directed    Increase activity slowly   Complete by: As directed      Allergies as of 05/12/2019      Reactions   Barbiturates Other (See Comments)   Keeps pt awake   Methocarbamol Itching   Sulfonamide Derivatives Itching      Medication List    STOP taking these medications   atorvastatin 20 MG tablet Commonly known as: LIPITOR   cyclobenzaprine 10 MG tablet Commonly known as: FLEXERIL   gabapentin 300 MG capsule Commonly known as: NEURONTIN   loratadine 10 MG tablet Commonly known as: Claritin   metoprolol succinate 50 MG 24 hr  tablet Commonly known as: TOPROL-XL   nystatin 100000 UNIT/ML suspension Commonly known as: MYCOSTATIN   oxyCODONE 5 MG immediate release tablet Commonly known as: Oxy IR/ROXICODONE   potassium chloride 10 MEQ tablet Commonly known as: K-DUR     TAKE these medications   benzonatate 100 MG capsule Commonly known as: TESSALON Take 1 capsule (100 mg total) by mouth 3 (three) times daily as needed for cough.   docusate sodium 100 MG capsule Commonly known as: COLACE Take 2 capsules (200 mg total) by mouth 2 (two) times daily.   lidocaine-prilocaine cream Commonly known as: EMLA Apply to affected area once   metoprolol tartrate 50 MG tablet Commonly known as: LOPRESSOR Take 1 tablet (50 mg total) by mouth 2 (two) times daily.   morphine 30 MG 12 hr tablet Commonly known as: MS CONTIN Take 1 tablet (30 mg total) by mouth every 12 (twelve) hours.   morphine 15 MG tablet Commonly known as: MSIR Take 1 tablet (15 mg total) by mouth every 4 (four) hours as needed for moderate pain.   Omeprazole-Sodium Bicarbonate 20-1100 MG Caps capsule Commonly known as: Zegerid Take 1 capsule by mouth daily before breakfast.   polyethylene glycol 17 g packet Commonly  known as: MIRALAX / GLYCOLAX Take 17 g by mouth 2 (two) times daily. What changed:   when to take this  reasons to take this   senna-docusate 8.6-50 MG tablet Commonly known as: Senokot-S Take 2 tablets by mouth 2 (two) times daily.       Allergies  Allergen Reactions  . Barbiturates Other (See Comments)    Keeps pt awake  . Methocarbamol Itching  . Sulfonamide Derivatives Itching    Consultations: ID [name]  Procedures/Studies: Dg Chest 2 View  Result Date: 05/02/2019 CLINICAL DATA:  Fever, RIGHT-side pleural drainage catheter placed yesterday EXAM: CHEST - 2 VIEW COMPARISON:  04/25/2019 Correlation: CTA chest 04/23/2019 FINDINGS: RIGHT jugular Port-A-Cath with tip projecting over SVC. New RIGHT  PleurX catheter. Enlargement of cardiac silhouette. Known RIGHT infrahilar mass incompletely visualized due to partial obscuration by moderate to large RIGHT pleural effusion and basilar atelectasis. LEFT lung clear. No pneumothorax. Bone destruction of the anterior RIGHT fourth and fifth ribs. IMPRESSION: Enlargement of cardiac silhouette. Known RIGHT perihilar mass with bone destruction of the anterior RIGHT fourth and fifth ribs. Large RIGHT pleural effusion and significant basilar atelectasis. Electronically Signed   By: Lavonia Dana M.D.   On: 05/02/2019 08:52   Dg Chest 2 View  Result Date: 04/23/2019 CLINICAL DATA:  Shortness of breath, tachycardia and lethargy. History of metastatic breast cancer. EXAM: CHEST - 2 VIEW COMPARISON:  Chest x-ray 04/16/2019 FINDINGS: The power port is stable. Persistent right lower lobe process which appears to be a combination effusion and atelectasis based on prior chest CT. There is also persistent streaky left basilar atelectasis. IMPRESSION: 1. Stable/persistent right lower lobe process likely combination of effusion and atelectasis. 2. Stable streaky left basilar atelectasis. Electronically Signed   By: Marijo Sanes M.D.   On: 04/23/2019 11:27   Dg Chest 2 View  Result Date: 04/16/2019 CLINICAL DATA:  Right flank and chest pain as well as right shoulder pain. Shortness of breath and weakness. Recent diagnosis of right breast cancer. EXAM: CHEST - 2 VIEW COMPARISON:  04/13/2019 and 11/14/2018 FINDINGS: Right IJ Port-A-Cath unchanged. Persistent moderate size right pleural effusion likely with associated right basilar atelectasis. Left lung is clear. Mild stable cardiomegaly. Remainder of the exam is unchanged. IMPRESSION: Stable moderate size right pleural effusion likely with associated right basilar compressive atelectasis. Mild stable cardiomegaly. Electronically Signed   By: Marin Olp M.D.   On: 04/16/2019 16:39   Dg Chest 2 View  Result Date:  04/13/2019 CLINICAL DATA:  48 year old with RIGHT mastectomy on 03/04/2019 for breast cancer, currently being admitted due to a RIGHT pleural effusion. EXAM: CHEST - 2 VIEW 5:30 p.m.: COMPARISON:  Portable chest x-ray earlier same day at 12:43 a.m. and previous chest x-rays. CT chest 04/11/2019 and earlier. FINDINGS: AP SEMI-ERECT and LATERAL images were obtained. Cardiac silhouette mildly enlarged even allowing for AP technique, unchanged. Large RIGHT pleural effusion with a subpulmonic component, unchanged since the examination earlier today, increased in size since yesterday. Associated passive atelectasis in the RIGHT LOWER LOBE. Mild linear atelectasis in the LEFT LOWER LOBE as noted on the recent CT. No new pulmonary parenchymal abnormalities. RIGHT jugular Port-A-Cath tip at or near the cavoatrial junction. Post surgical changes related to RIGHT mastectomy and axillary node removal. IMPRESSION: 1. Large RIGHT pleural effusion with a subpulmonic component, increased in size since yesterday. Associated passive atelectasis in the RIGHT LOWER LOBE. 2. Mild linear atelectasis in the LEFT LOWER LOBE as noted on the recent CT. 3.  No new abnormalities. Electronically Signed   By: Evangeline Dakin M.D.   On: 04/13/2019 18:11   Ct Head Wo Contrast  Result Date: 05/02/2019 CLINICAL DATA:  Altered mental status, metastatic breast cancer EXAM: CT HEAD WITHOUT CONTRAST TECHNIQUE: Contiguous axial images were obtained from the base of the skull through the vertex without intravenous contrast. COMPARISON:  05/17/2010 FINDINGS: Brain: No evidence of acute infarction, hemorrhage, hydrocephalus, extra-axial collection or mass lesion/mass effect. Periventricular and deep white matter hypodensity. Vascular: No hyperdense vessel or unexpected calcification. Skull: Normal. Negative for fracture or focal lesion. Sinuses/Orbits: No acute finding. Other: None. IMPRESSION: No acute intracranial pathology. Periventricular and deep  white matter hypodensity which is advanced for patient age although may nonetheless reflect small-vessel white matter disease. No noncontrast CT evidence of intracranial metastatic disease. Consider contrast enhanced MRI to more sensitively evaluate for metastatic disease, if suspected. Electronically Signed   By: Eddie Candle M.D.   On: 05/02/2019 16:40   Ct Angio Chest Pe W/cm &/or Wo Cm  Result Date: 04/23/2019 CLINICAL DATA:  Shortness of breath, history of metastatic breast cancer EXAM: CT ANGIOGRAPHY CHEST WITH CONTRAST TECHNIQUE: Multidetector CT imaging of the chest was performed using the standard protocol during bolus administration of intravenous contrast. Multiplanar CT image reconstructions and MIPs were obtained to evaluate the vascular anatomy. CONTRAST:  118mL OMNIPAQUE IOHEXOL 350 MG/ML SOLN COMPARISON:  04/11/2019 FINDINGS: Cardiovascular: Satisfactory opacification of the pulmonary arteries to the segmental level. No evidence of pulmonary embolism. Normal heart size. No pericardial effusion. Right chest port catheter. Mediastinum/Nodes: No enlarged mediastinal, hilar, or axillary lymph nodes. Thyroid gland, trachea, and esophagus demonstrate no significant findings. Lungs/Pleura: Moderate to large right pleural effusion and associated atelectasis or consolidation, slightly increased compared to prior examination. There are multiple small bilateral pulmonary nodules, which are new or enlarged compared to prior examination, for example 5 mm nodule of the left upper lobe (series 10, image 37). Upper Abdomen: No acute abnormality. Musculoskeletal: Status post right mastectomy. There is a redemonstrated destructive soft tissue lesion of the anterior right chest wall and pectoral musculature, which is difficult to distinguish from adjacent pleural fluid and mostly appreciated by the destructive effect on the right anterior fourth rib. There has been interval enlargement of adjacent soft tissue  nodularity of the chest wall (e.g. Series 5, image 166) Review of the MIP images confirms the above findings. IMPRESSION: 1.  Negative examination for pulmonary embolism. 2. Moderate to large right pleural effusion and associated atelectasis or consolidation, slightly increased compared to prior examination. 3. There are multiple small bilateral pulmonary nodules, which are new or enlarged compared to prior examination, for example 5 mm nodule of the left upper lobe (series 10, image 37). Findings are most consistent with pulmonary metastatic disease, although atypical infection is not excluded given very rapid interval development in comparison to CT dated 04/11/2019. 4. Status post right mastectomy. There is a redemonstrated destructive soft tissue lesion of the anterior right chest wall and pectoral musculature, which is difficult to distinguish from adjacent pleural fluid and mostly appreciated by the destructive effect on the right anterior fourth rib. There has been interval enlargement of adjacent soft tissue nodularity of the chest wall (e.g. Series 5, image 166) Electronically Signed   By: Eddie Candle M.D.   On: 04/23/2019 15:22   Ct Biopsy  Result Date: 04/24/2019 CLINICAL DATA:  History of breast carcinoma. Enlarging anterior right chest wall mass. EXAM: CT GUIDED CORE BIOPSY OF RIGHT ANTERIOR CHEST WALL  MASS ANESTHESIA/SEDATION: Intravenous Fentanyl 73mcg and Versed 1mg  were administered as conscious sedation during continuous monitoring of the patient's level of consciousness and physiological / cardiorespiratory status by the radiology RN, with a total moderate sedation time of 10 minutes. PROCEDURE: The procedure risks, benefits, and alternatives were explained to the patient. Questions regarding the procedure were encouraged and answered. The patient understands and consents to the procedure. Select axial scans through the lower chest were obtained. The right anterior chest wall mass was  localized and an appropriate skin entry site was determined and marked. The operative field was prepped with chlorhexidinein a sterile fashion, and a sterile drape was applied covering the operative field. A sterile gown and sterile gloves were used for the procedure. Local anesthesia was provided with 1% Lidocaine. Under CT fluoroscopic guidance, a 17 gauge trocar needle was advanced to the margin of the lesion. Once needle tip position was confirmed, coaxial 18-gauge core biopsy samples were obtained, submitted in formalin to surgical pathology. The guide needle was removed. Postprocedure scans show no hemorrhage or other apparent complication. The patient tolerated the procedure well. COMPLICATIONS: None immediate FINDINGS: Right anterior chest wall mass was localized. Representative core biopsy samples obtained as above. IMPRESSION: 1. Technically successful CT-guided core biopsy, right anterior chest wall mass. Electronically Signed   By: Lucrezia Europe M.D.   On: 04/24/2019 14:27   Dg Chest Port 1 View  Result Date: 05/10/2019 CLINICAL DATA:  Shortness of breath. EXAM: PORTABLE CHEST 1 VIEW COMPARISON:  Radiograph 05/05/2019, additional priors. FINDINGS: Accessed right chest port remains in place. Right PleurX catheter tip at the apex. Large pleural effusion has decreased from prior exam with some improving aeration of the right lung apex. Similar leftward mediastinal shift to before. Left lung is grossly clear. No pneumothorax or other change from prior exam. IMPRESSION: Right PleurX catheter in place with persistent but diminishing large right pleural effusion. Improving right apical aeration. Otherwise unchanged exam. Electronically Signed   By: Keith Rake M.D.   On: 05/10/2019 22:22   Dg Chest Port 1 View  Result Date: 05/05/2019 CLINICAL DATA:  Sepsis. EXAM: PORTABLE CHEST 1 VIEW COMPARISON:  Radiographs of May 02, 2019. FINDINGS: Stable cardiomediastinal silhouette. Right-sided chest tube is  unchanged in position. Right internal jugular Port-A-Cath is unchanged. Large right pleural effusion is noted which is significantly increased in size compared to prior exam. Underlying atelectasis or infiltrate cannot be excluded. Left lung is clear. Bony thorax is unremarkable. No pneumothorax is noted. IMPRESSION: Large right pleural effusion is noted which is enlarged compared to prior exam. Stable position of right-sided chest tube. Electronically Signed   By: Marijo Conception M.D.   On: 05/05/2019 09:17   Dg Chest Port 1 View  Result Date: 04/25/2019 CLINICAL DATA:  Fever EXAM: PORTABLE CHEST 1 VIEW COMPARISON:  April 23, 2019 FINDINGS: The mediastinal contour and cardiac silhouette are stable. Right central venous line is unchanged. There is a large right pleural effusion with consolidation of the right mid and lower lung worsened compared prior exam. The left lung is clear. The bony structures are stable. IMPRESSION: Large right pleural effusion with consolidation of the right mid and lower lung worse compared to prior exam. Electronically Signed   By: Abelardo Diesel M.D.   On: 04/25/2019 19:49   Dg Chest Port 1 View  Result Date: 04/13/2019 CLINICAL DATA:  48 year old female with decreased breath sounds. EXAM: PORTABLE CHEST 1 VIEW COMPARISON:  Chest radiograph dated 04/12/2019 FINDINGS: Right-sided Port-A-Cath  in similar position. There is shallow inspiration with bibasilar atelectasis. Infiltrate is not excluded. Overall no significant interval change since the earlier radiograph. Probable small bilateral pleural effusions there is no pneumothorax. Stable cardiac silhouette. No acute osseous pathology. Right axillary surgical clips. IMPRESSION: Shallow inspiration with small bilateral pleural effusions and bibasilar atelectasis. Overall similar or slightly worsened since the prior radiograph. Electronically Signed   By: Anner Crete M.D.   On: 04/13/2019 01:13   Dg Chest Port 1 View  Result  Date: 04/12/2019 CLINICAL DATA:  Post thoracentesis EXAM: PORTABLE CHEST 1 VIEW COMPARISON:  04/09/2019 FINDINGS: Slight interval reduction in volume of a right-sided pleural effusion. No significant pneumothorax. Otherwise unchanged examination with cardiomegaly and bandlike scarring or atelectasis of the left lung base. IMPRESSION: Slight interval reduction in volume of a right-sided pleural effusion. No significant pneumothorax. Otherwise unchanged examination with cardiomegaly and bandlike scarring or atelectasis of the left lung base. Electronically Signed   By: Eddie Candle M.D.   On: 04/12/2019 12:46   Ir Perc Pleural Drain W/indwell Cath W/img Guide  Result Date: 05/01/2019 CLINICAL DATA:  History of metastatic breast cancer, now with recurrent symptomatic malignant right-sided pleural effusion. Request now made for placement of a right-sided tunneled pleural catheter for palliative purposes. EXAM: INSERTION OF TUNNELED RIGHT SIDED PLEURAL DRAINAGE CATHETER COMPARISON:  Ultrasound-guided thoracentesis-04/24/2019; 04/12/2019; chest CTA-04/23/2019 MEDICATIONS: Ancef 2 gm IV; Antibiotic was administered in an appropriate time interval for the procedure. ANESTHESIA/SEDATION: Moderate (conscious) sedation was employed during this procedure. A total of Versed 4 mg and Fentanyl 200 mcg was administered intravenously. Moderate Sedation Time: 16 minutes. The patient's level of consciousness and vital signs were monitored continuously by radiology nursing throughout the procedure under my direct supervision. FLUOROSCOPY TIME:  FLUOROSCOPY TIME 12 seconds (3 mGy) COMPLICATIONS: None immediate. PROCEDURE: The procedure, risks, benefits, and alternatives were explained to the patient, who wish to proceed with the placement of this permanent pleural catheter as the patient is seeking palliative care. The patient understand and consent to the procedure. The right inferior lateral chest and upper abdomen were prepped  with Chlorhexidine in a sterile fashion, and a sterile drape was applied covering the operative field. Attention was made to avoid the patient's known right anterior chest wall mass. A sterile gown and sterile gloves were used for the procedure. Initial ultrasound scanning and fluoroscopic imaging demonstrates a recurrent moderate to large pleural effusion. Under direct ultrasound guidance, the right inferior lateral pleural space was accessed with a Yueh sheath needle after the overlying soft tissues were anesthetized with 1% lidocaine with epinephrine. An Amplatz super stiff wire was then advanced under fluoroscopy into the pleural space. A 15.5 French tunneled Pleur-X catheter was tunneled from an incision within the right upper abdominal quadrant to the access site. The pleural access site was serially dilated under fluoroscopy, ultimately allowing placement of a peel-away sheath. The catheter was advanced through the peel-away sheath. The sheath was then removed. Final catheter positioning was confirmed with a fluoroscopic radiographic image. The access incision was closed with subcutaneous subcuticular 4-0 Vicryl, Dermabond and Steri-Strips. A Prolene retention suture was applied at the catheter exit site. Large volume thoracentesis was performed through the new catheter utilizing provided bulb vacuum assisted drainage bag. The patient tolerated the above procedure well without immediate postprocedural complication. FINDINGS: Preprocedural ultrasound scanning demonstrates a recurrent large sized anechoic right sided pleural effusion. After ultrasound and fluoroscopic guided placement, the catheter is directed right lung apex Following catheter placement, approximately 1.3  L of bloody pleural fluid was removed. IMPRESSION: Successful placement of permanent, tunneled right pleural drainage catheter via lateral approach. 1.3 liters of bloody pleural fluid was removed after catheter placement. Electronically  Signed   By: Sandi Mariscal M.D.   On: 05/01/2019 15:42   US Thoracentesis Asp Pleural Space W/img Guide  Result Date: 04/24/2019 INDICATION: Patient with history of right breast cancer, dyspnea, and recurrent right pleural effusion. Request is made for diagnostic and therapeutic right thoracentesis. EXAM: ULTRASOUND GUIDED DIAGNOSTIC AND THERAPEUTIC RIGHT THORACENTESIS MEDICATIONS: 10 mL 1% lidocaine 1 mg morphine IV COMPLICATIONS: None immediate. PROCEDURE: An ultrasound guided thoracentesis was thoroughly discussed with the patient and questions answered. The benefits, risks, alternatives and complications were also discussed. The patient understands and wishes to proceed with the procedure. Written consent was obtained. Ultrasound was performed to localize and mark an adequate pocket of fluid in the right chest. The area was then prepped and draped in the normal sterile fashion. 1% Lidocaine was used for local anesthesia. Under ultrasound guidance a 6 Fr Safe-T-Centesis catheter was introduced. Thoracentesis was performed. The catheter was removed and a dressing applied. FINDINGS: A total of approximately 650 mL of dark red fluid was removed. Samples were sent to the laboratory as requested by the clinical team. IMPRESSION: Successful ultrasound guided right thoracentesis yielding 650 mL of pleural fluid. Read by: Earley Abide, PA-C No pneumothorax on follow-up CT. Electronically Signed   By: Lucrezia Europe M.D.   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 She presents for thoracentesis. EXAM: ULTRASOUND GUIDED RIGHT THORACENTESIS MEDICATIONS: None. COMPLICATIONS: None immediate. PROCEDURE: An ultrasound guided thoracentesis was thoroughly discussed with the patient and questions answered. The benefits, risks, alternatives and complications  were also discussed. The patient understands and wishes to proceed with the procedure. Written consent was obtained. Ultrasound was performed to localize and mark an adequate pocket of fluid in the right chest. The area was then prepped and draped in the normal sterile fashion. 1% Lidocaine was used for local anesthesia. Under ultrasound guidance a 6 Fr Safe-T-Centesis catheter was introduced. Thoracentesis was performed. The catheter was removed and a dressing applied. FINDINGS: A total of approximately 500 mL of bloody pleural fluid was removed. Samples were sent to the laboratory as requested by the clinical team. IMPRESSION: Successful ultrasound guided right thoracentesis yielding 500 mL of pleural fluid. Electronically Signed   By: Jacqulynn Cadet M.D.   On: 04/12/2019 13:09      Subjective: She is feeling well today, she wants to go home.  She is already dressed.  She denies shortness of breath.  She did not allow nurse to try more than 250 cc from the Pleurx catheter at the time  Discharge Exam: Vitals:   05/12/19 0521 05/12/19 0921  BP: (!) 144/95 (!) 149/98  Pulse: (!) 110 (!) 116  Resp: (!) 28 (!) 22  Temp: 97.6 F (36.4 C) 99.2 F (37.3 C)  SpO2: 99% 99%     General: Pt is alert, awake, not in acute distress Cardiovascular: RRR, S1/S2 +, no rubs, no gallops Respiratory: CTA bilaterally, no wheezing, no rhonchi Abdominal: Soft, NT, ND, bowel sounds + Extremities: no edema, no cyanosis    The results of significant diagnostics from this hospitalization (including imaging, microbiology, ancillary and laboratory) are listed below for reference.     Microbiology: Recent Results (  from the past 240 hour(s))  Culture, blood (Routine X 2) w Reflex to ID Panel     Status: None   Collection Time: 05/05/19 10:03 AM   Specimen: BLOOD  Result Value Ref Range Status   Specimen Description   Final    BLOOD LEFT ANTECUBITAL Performed at [facility], [place] [address],  [place], [place] [address]    Special Requests   Final    BOTTLES DRAWN AEROBIC AND ANAEROBIC Blood Culture adequate volume Performed at [facility] [address] [place], [place] [address]    Culture   Final    NO GROWTH 5 DAYS Performed at [facility], [place] [address], [place], [place] [address]    Report Status 05/10/2019 FINAL  Final  Culture, blood (Routine X 2) w Reflex to ID Panel     Status: None   Collection Time: 05/05/19 10:12 AM   Specimen: BLOOD LEFT HAND  Result Value Ref Range Status   Specimen Description   Final    BLOOD LEFT HAND Performed at [facility] [address], [place], [place] [address]    Special Requests   Final    BOTTLES DRAWN AEROBIC ONLY Blood Culture adequate volume Performed at [facility] [address] [place], [place] [address]    Culture   Final    NO GROWTH 5 DAYS Performed at [facility], [place] [address], [place], [place] [address]    Report Status 05/10/2019 FINAL  Final  Body fluid culture     Status: None   Collection Time: 05/06/19  1:58 PM   Specimen: Pleura; Body Fluid  Result Value Ref Range Status   Specimen Description   Final    PLEURAL Performed at [facility] [address] [place], [place] [address]    Special Requests   Final    NONE Performed at [facility], [place] [address] [place], [place] [address]    Gram Stain   Final    ABUNDANT WBC PRESENT,BOTH PMN AND MONONUCLEAR NO ORGANISMS SEEN    Culture   Final    NO GROWTH Performed at [facility], [place] [address] [place], [place] [address]    Report Status 05/09/2019 FINAL  Final     Labs: BNP (last 3 results) No results for input(s): BNP in the last 8760 hours. Basic Metabolic Panel: Recent Labs  Lab 05/06/19 0426  05/08/19 0339 05/09/19 0452 05/09/19 1303 05/10/19 0948 05/12/19 0307  NA 140   < > 144 146* 143 144 145  K 3.5   < >  3.1* 3.2* 3.3* 3.4* 4.3  CL 105   < > 109 114* 109 110 108  CO2 24   < > 21* 21* 21* 21* 24  GLUCOSE 112*   < > 131* 117* 150* 127* 101*  BUN 6   < > 5* 6 6 <5* 9  CREATININE 0.56   < > 0.65 0.75 0.73 0.80 0.87  CALCIUM 6.7*   < > 6.6* 6.6* 6.5* 6.7* 7.2*  PHOS 1.2*  --   --  1.7* 3.7  --   --    < > = values in this interval not displayed.   Liver Function Tests: Recent Labs  Lab 05/06/19 0426  AST 13*  ALT 18  ALKPHOS 118  BILITOT 0.4  PROT 6.1*  ALBUMIN 2.3*   No results for input(s): LIPASE, AMYLASE in  the last 168 hours. No results for input(s): AMMONIA in the last 168 hours. CBC: Recent Labs  Lab 05/06/19 0426 05/07/19 0600 05/08/19 0339 05/10/19 0948 05/12/19 0307  WBC 6.7 7.7 8.5 10.9* 14.4*  NEUTROABS 4.5 5.2 5.3  --   --   HGB 8.6* 8.9* 9.3* 9.3* 10.5*  HCT 28.7* 30.7* 31.2* 31.8* 35.7*  MCV 84.2 83.4 82.8 83.5 82.8  PLT 333 388 445* 464* 582*   Cardiac Enzymes: No results for input(s): CKTOTAL, CKMB, CKMBINDEX, TROPONINI in the last 168 hours. BNP: Invalid input(s): POCBNP CBG: No results for input(s): GLUCAP in the last 168 hours. D-Dimer No results for input(s): DDIMER in the last 72 hours. Hgb A1c No results for input(s): HGBA1C in the last 72 hours. Lipid Profile No results for input(s): CHOL, HDL, LDLCALC, TRIG, CHOLHDL, LDLDIRECT in the last 72 hours. Thyroid function studies No results for input(s): TSH, T4TOTAL, T3FREE, THYROIDAB in the last 72 hours.  Invalid input(s): FREET3 Anemia work up No results for input(s): VITAMINB12, FOLATE, FERRITIN, TIBC, IRON, RETICCTPCT in the last 72 hours. Urinalysis    Component Value Date/Time   COLORURINE YELLOW 05/12/2019 0724   APPEARANCEUR CLEAR 05/12/2019 0724   LABSPEC 1.012 05/12/2019 0724   PHURINE 7.0 05/12/2019 0724   GLUCOSEU NEGATIVE 05/12/2019 0724   HGBUR NEGATIVE 05/12/2019 0724   BILIRUBINUR NEGATIVE 05/12/2019 0724   BILIRUBINUR negative 05/31/2018 1011   KETONESUR NEGATIVE  05/12/2019 0724   PROTEINUR NEGATIVE 05/12/2019 0724   UROBILINOGEN 0.2 05/31/2018 1011   UROBILINOGEN 0.2 05/18/2010 1042   NITRITE NEGATIVE 05/12/2019 0724   LEUKOCYTESUR TRACE (A) 05/12/2019 0724   Sepsis Labs Invalid input(s): PROCALCITONIN,  WBC,  LACTICIDVEN Microbiology Recent Results (from the past 240 hour(s))  Culture, blood (Routine X 2) w Reflex to ID Panel     Status: None   Collection Time: 05/05/19 10:03 AM   Specimen: BLOOD  Result Value Ref Range Status   Specimen Description   Final    BLOOD LEFT ANTECUBITAL Performed at [facility], [place] [address], [place], [place] [address]    Special Requests   Final    BOTTLES DRAWN AEROBIC AND ANAEROBIC Blood Culture adequate volume Performed at [facility] [address], [place], [place] [address]    Culture   Final    NO GROWTH 5 DAYS Performed at [facility], [place] [address] [place] [address], [place], [place] [address]    Report Status 05/10/2019 FINAL  Final  Culture, blood (Routine X 2) w Reflex to ID Panel     Status: None   Collection Time: 05/05/19 10:12 AM   Specimen: BLOOD LEFT HAND  Result Value Ref Range Status   Specimen Description   Final    BLOOD LEFT HAND Performed at [facility] [address] [place], [place] [address]    Special Requests   Final    BOTTLES DRAWN AEROBIC ONLY Blood Culture adequate volume Performed at [facility] [address], [place], [place] [address]    Culture   Final    NO GROWTH 5 DAYS Performed at [facility], [place] [address], [place], [place] [address]    Report Status 05/10/2019 FINAL  Final  Body fluid culture     Status: None   Collection Time: 05/06/19  1:58 PM   Specimen: Pleura; Body Fluid  Result Value Ref Range Status   Specimen Description   Final    PLEURAL Performed at [facility]  [place] [address] [place], [place] [address]    Special Requests   Final     NONE Performed at [facility], [place] [address] [place], [place] [address]    Gram Stain   Final    ABUNDANT WBC PRESENT,BOTH PMN AND MONONUCLEAR NO ORGANISMS SEEN    Culture   Final    NO GROWTH Performed at [facility], [place] [address] [place], [place] [address]    Report Status 05/09/2019 FINAL  Final     Time coordinating discharge: 40 minutes  SIGNED:   Elmarie Shiley, MD  [facility]

## 2019-05-12 NOTE — Telephone Encounter (Signed)
This RN called pt to request her to bring her PleurX supplies so this office can assist with draining. ( See d/c note per today with inability to arrange home health for this patient.  [name] states she was not given supplies except she has 1 bottle.  This RN attempted to call pt's case manager per note in chart- but no number noted on dictation- email sent inquiring about supplies that should have been sent home with pt for pleurX drainage.  This RN then called the floor pt was discharged from and requested to speak to case manager- note the case manager who documented note yesterday work weekends only. This RN was given another case manager name and phone number- this RN called and was informed by her that she does not cover that unit and was directed to another case manage.  This case manager states supplies were ordered last week- and stated best to verify with nurse who discharged pt about supplies.  This RN called [facility] again and spoke with attending nurse. She states pt did have a box of supplies in the room - " and she should have taken them with her "  Nurse states the patient was " ready to leave and was even going in a wheel chair before her discharge was final " She states pt's room has been cleaned and there were no supplies in the room.  This RN sent an email to attending case manager per d/c note regarding need for supplies.  This RN then called the patient again-   and she states there was a box of supplies " but we used them up except for one while in the hospital "  This RN requested pt to bring the one kit with her for use tomorrow.  During above discussion inquiry was made by a family member " that transportation has been arranged for tomorrow " This RN informed her - transportation is not managed by the nurses but will inquire with our transportation coordinator. Of note family stated need for " special lift due to [facility] needs assistance getting from the home into the  car"  [name] then stated " no I don't I can get from my house to the car "  This RN stated contact will be made with transportation to verify arrangement.  This RN called Di Kindle - she states per her communication with the patient's sister " the did state she needed additional help but that a family member would be bringing her tomorrow "  This RN discussed above communication with family and patient with expectation stated most recently that this office was providing transport.  Di Kindle states she will call patient presently and verify what the plan is.  Note above situation including need to verify competent nurse in office who can drain pleurX, follow up on supplies and then transportation took approximately 90 minutes.

## 2019-05-13 ENCOUNTER — Other Ambulatory Visit: Payer: Self-pay | Admitting: *Deleted

## 2019-05-13 ENCOUNTER — Encounter: Payer: Self-pay | Admitting: *Deleted

## 2019-05-13 ENCOUNTER — Other Ambulatory Visit: Payer: Self-pay

## 2019-05-13 ENCOUNTER — Inpatient Hospital Stay (HOSPITAL_BASED_OUTPATIENT_CLINIC_OR_DEPARTMENT_OTHER): Payer: BC Managed Care – PPO | Admitting: Oncology

## 2019-05-13 ENCOUNTER — Inpatient Hospital Stay: Payer: BC Managed Care – PPO | Attending: Adult Health

## 2019-05-13 ENCOUNTER — Inpatient Hospital Stay: Payer: BC Managed Care – PPO

## 2019-05-13 VITALS — BP 136/92 | HR 105 | Resp 20

## 2019-05-13 VITALS — BP 128/81 | HR 123 | Resp 20

## 2019-05-13 DIAGNOSIS — Z171 Estrogen receptor negative status [ER-]: Secondary | ICD-10-CM

## 2019-05-13 DIAGNOSIS — G893 Neoplasm related pain (acute) (chronic): Secondary | ICD-10-CM | POA: Diagnosis not present

## 2019-05-13 DIAGNOSIS — Z7189 Other specified counseling: Secondary | ICD-10-CM | POA: Diagnosis not present

## 2019-05-13 DIAGNOSIS — C50511 Malignant neoplasm of lower-outer quadrant of right female breast: Secondary | ICD-10-CM

## 2019-05-13 DIAGNOSIS — Z5112 Encounter for antineoplastic immunotherapy: Secondary | ICD-10-CM | POA: Insufficient documentation

## 2019-05-13 DIAGNOSIS — J910 Malignant pleural effusion: Secondary | ICD-10-CM

## 2019-05-13 DIAGNOSIS — Z79899 Other long term (current) drug therapy: Secondary | ICD-10-CM | POA: Insufficient documentation

## 2019-05-13 DIAGNOSIS — C7800 Secondary malignant neoplasm of unspecified lung: Secondary | ICD-10-CM | POA: Insufficient documentation

## 2019-05-13 DIAGNOSIS — Z5111 Encounter for antineoplastic chemotherapy: Secondary | ICD-10-CM | POA: Insufficient documentation

## 2019-05-13 DIAGNOSIS — R110 Nausea: Secondary | ICD-10-CM | POA: Insufficient documentation

## 2019-05-13 DIAGNOSIS — Z5189 Encounter for other specified aftercare: Secondary | ICD-10-CM | POA: Insufficient documentation

## 2019-05-13 LAB — CBC WITH DIFFERENTIAL/PLATELET
Abs Immature Granulocytes: 0.11 10*3/uL — ABNORMAL HIGH (ref 0.00–0.07)
Basophils Absolute: 0 10*3/uL (ref 0.0–0.1)
Basophils Relative: 0 %
Eosinophils Absolute: 0 10*3/uL (ref 0.0–0.5)
Eosinophils Relative: 0 %
HCT: 41 % (ref 36.0–46.0)
Hemoglobin: 12.2 g/dL (ref 12.0–15.0)
Immature Granulocytes: 1 %
Lymphocytes Relative: 9 %
Lymphs Abs: 1.7 10*3/uL (ref 0.7–4.0)
MCH: 24.1 pg — ABNORMAL LOW (ref 26.0–34.0)
MCHC: 29.8 g/dL — ABNORMAL LOW (ref 30.0–36.0)
MCV: 80.9 fL (ref 80.0–100.0)
Monocytes Absolute: 1.7 10*3/uL — ABNORMAL HIGH (ref 0.1–1.0)
Monocytes Relative: 9 %
Neutro Abs: 14.7 10*3/uL — ABNORMAL HIGH (ref 1.7–7.7)
Neutrophils Relative %: 81 %
Platelets: 640 10*3/uL — ABNORMAL HIGH (ref 150–400)
RBC: 5.07 MIL/uL (ref 3.87–5.11)
RDW: 21.1 % — ABNORMAL HIGH (ref 11.5–15.5)
WBC: 18.2 10*3/uL — ABNORMAL HIGH (ref 4.0–10.5)
nRBC: 0.2 % (ref 0.0–0.2)

## 2019-05-13 LAB — COMPREHENSIVE METABOLIC PANEL
ALT: 19 U/L (ref 0–44)
AST: 14 U/L — ABNORMAL LOW (ref 15–41)
Albumin: 2.6 g/dL — ABNORMAL LOW (ref 3.5–5.0)
Alkaline Phosphatase: 166 U/L — ABNORMAL HIGH (ref 38–126)
Anion gap: 20 — ABNORMAL HIGH (ref 5–15)
BUN: 19 mg/dL (ref 6–20)
CO2: 20 mmol/L — ABNORMAL LOW (ref 22–32)
Calcium: 7.8 mg/dL — ABNORMAL LOW (ref 8.9–10.3)
Chloride: 107 mmol/L (ref 98–111)
Creatinine, Ser: 1.18 mg/dL — ABNORMAL HIGH (ref 0.44–1.00)
GFR calc Af Amer: >60 mL/min (ref >60)
GFR calc non Af Amer: 54 mL/min — ABNORMAL LOW (ref >60)
Glucose, Bld: 135 mg/dL — ABNORMAL HIGH (ref 70–99)
Potassium: 5 mmol/L (ref 3.5–5.1)
Sodium: 147 mmol/L — ABNORMAL HIGH (ref 135–145)
Total Bilirubin: 0.3 mg/dL (ref 0.3–1.2)
Total Protein: 7.6 g/dL (ref 6.5–8.1)

## 2019-05-13 LAB — TSH: TSH: 1.655 u[IU]/mL (ref 0.308–3.960)

## 2019-05-13 MED ORDER — HEPARIN SOD (PORK) LOCK FLUSH 100 UNIT/ML IV SOLN
500.0000 [IU] | Freq: Once | INTRAVENOUS | Status: AC | PRN
  Administered 2019-05-13: 15:00:00 500 [IU]
  Filled 2019-05-13: qty 5

## 2019-05-13 MED ORDER — DEXAMETHASONE SODIUM PHOSPHATE 10 MG/ML IJ SOLN
10.0000 mg | Freq: Once | INTRAMUSCULAR | Status: AC
  Administered 2019-05-13: 12:00:00 10 mg via INTRAVENOUS

## 2019-05-13 MED ORDER — PALONOSETRON HCL INJECTION 0.25 MG/5ML
INTRAVENOUS | Status: AC
  Filled 2019-05-13: qty 5

## 2019-05-13 MED ORDER — MORPHINE SULFATE (PF) 4 MG/ML IV SOLN
INTRAVENOUS | Status: AC
  Filled 2019-05-13: qty 1

## 2019-05-13 MED ORDER — DEXAMETHASONE SODIUM PHOSPHATE 10 MG/ML IJ SOLN
INTRAMUSCULAR | Status: AC
  Filled 2019-05-13: qty 1

## 2019-05-13 MED ORDER — MORPHINE SULFATE 15 MG PO TABS: 15.0000 mg | ORAL_TABLET | ORAL | 0 refills | Status: DC | PRN

## 2019-05-13 MED ORDER — SODIUM CHLORIDE 0.9 % IV SOLN
1000.0000 mg/m2 | Freq: Once | INTRAVENOUS | Status: AC
  Administered 2019-05-13: 14:00:00 1862 mg via INTRAVENOUS
  Filled 2019-05-13: qty 48.97

## 2019-05-13 MED ORDER — PROCHLORPERAZINE MALEATE 10 MG PO TABS: 10.0000 mg | ORAL_TABLET | Freq: Four times a day (QID) | ORAL | 0 refills | Status: AC | PRN

## 2019-05-13 MED ORDER — SODIUM CHLORIDE 0.9 % IV SOLN
Freq: Once | INTRAVENOUS | Status: AC
  Administered 2019-05-13: 11:00:00 via INTRAVENOUS
  Filled 2019-05-13: qty 250

## 2019-05-13 MED ORDER — MORPHINE SULFATE 4 MG/ML IJ SOLN
2.0000 mg | INTRAMUSCULAR | Status: DC | PRN
  Administered 2019-05-13: 12:00:00 2 mg via INTRAVENOUS
  Filled 2019-05-13: qty 1

## 2019-05-13 MED ORDER — SODIUM CHLORIDE 0.9 % IV SOLN
193.0000 mg | Freq: Once | INTRAVENOUS | Status: AC
  Administered 2019-05-13: 14:00:00 190 mg via INTRAVENOUS
  Filled 2019-05-13: qty 19

## 2019-05-13 MED ORDER — PALONOSETRON HCL INJECTION 0.25 MG/5ML
0.2500 mg | Freq: Once | INTRAVENOUS | Status: AC
  Administered 2019-05-13: 12:00:00 0.25 mg via INTRAVENOUS

## 2019-05-13 MED ORDER — SODIUM CHLORIDE 0.9 % IV SOLN: 243.8000 mg | Freq: Once | INTRAVENOUS | Status: DC

## 2019-05-13 MED ORDER — SODIUM CHLORIDE 0.9 % IV SOLN
200.0000 mg | Freq: Once | INTRAVENOUS | Status: AC
  Administered 2019-05-13: 13:00:00 200 mg via INTRAVENOUS
  Filled 2019-05-13: qty 8

## 2019-05-13 MED ORDER — MORPHINE SULFATE ER 30 MG PO TBCR: 30.0000 mg | EXTENDED_RELEASE_TABLET | Freq: Two times a day (BID) | ORAL | 0 refills | Status: DC

## 2019-05-13 MED ORDER — SODIUM CHLORIDE 0.9% FLUSH
10.0000 mL | INTRAVENOUS | Status: DC | PRN
  Administered 2019-05-13: 10 mL
  Filled 2019-05-13: qty 10

## 2019-05-13 NOTE — Progress Notes (Signed)
Per Dr Jana Hakim ok to tx today with elevated heart rate.

## 2019-05-13 NOTE — Progress Notes (Signed)
RN assisted pt to infusion.  Pt c/o pain and discomfort since PleurX catheter was drained.  SHOB has subsided.    RN obtained verbal orders from MD for IV Morphine 2mg  Q 1 hour, as needed.  Ok to use inpatient labs from 05/12/2019 per [name].  Infusion nurse notified.

## 2019-05-13 NOTE — Progress Notes (Signed)
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

## 2019-05-13 NOTE — Patient Instructions (Signed)
[facility] Discharge Instructions for Patients Receiving Chemotherapy  Today you received the following chemotherapy agents: pembrolizumab Beryle Flock), gemcitabine (Gemzar), carboplatin (Paraplatin)  To help prevent nausea and vomiting after your treatment, we encourage you to take your nausea medication as directed by your physician.    If you develop nausea and vomiting that is not controlled by your nausea medication, call the clinic.   BELOW ARE SYMPTOMS THAT SHOULD BE REPORTED IMMEDIATELY:  *FEVER GREATER THAN 100.5 F  *CHILLS WITH OR WITHOUT FEVER  NAUSEA AND VOMITING THAT IS NOT CONTROLLED WITH YOUR NAUSEA MEDICATION  *UNUSUAL SHORTNESS OF BREATH  *UNUSUAL BRUISING OR BLEEDING  TENDERNESS IN MOUTH AND THROAT WITH OR WITHOUT PRESENCE OF ULCERS  *URINARY PROBLEMS  *BOWEL PROBLEMS  UNUSUAL RASH Items with * indicate a potential emergency and should be followed up as soon as possible.  Feel free to call the clinic should you have any questions or concerns. The clinic phone number is [phone].  Please show the [facility] at check-in to the Emergency Department and triage nurse.  Gemcitabine injection What is this medicine? GEMCITABINE (jem SYE ta been) is a chemotherapy drug. This medicine is used to treat many types of cancer like breast cancer, lung cancer, pancreatic cancer, and ovarian cancer. This medicine may be used for other purposes; ask your health care provider or pharmacist if you have questions. COMMON BRAND NAME(S): Gemzar, Infugem What should I tell my health care provider before I take this medicine? They need to know if you have any of these conditions:  blood disorders  infection  kidney disease  liver disease  lung or breathing disease, like asthma  recent or ongoing radiation therapy  an unusual or allergic reaction to gemcitabine, other chemotherapy, other medicines, foods, dyes, or preservatives  pregnant or  trying to get pregnant  breast-feeding How should I use this medicine? This drug is given as an infusion into a vein. It is administered in a hospital or clinic by a specially trained health care professional. Talk to your pediatrician regarding the use of this medicine in children. Special care may be needed. Overdosage: If you think you have taken too much of this medicine contact a poison control center or emergency room at once. NOTE: This medicine is only for you. Do not share this medicine with others. What if I miss a dose? It is important not to miss your dose. Call your doctor or health care professional if you are unable to keep an appointment. What may interact with this medicine?  medicines to increase blood counts like filgrastim, pegfilgrastim, sargramostim  some other chemotherapy drugs like cisplatin  vaccines Talk to your doctor or health care professional before taking any of these medicines:  acetaminophen  aspirin  ibuprofen  ketoprofen  naproxen This list may not describe all possible interactions. Give your health care provider a list of all the medicines, herbs, non-prescription drugs, or dietary supplements you use. Also tell them if you smoke, drink alcohol, or use illegal drugs. Some items may interact with your medicine. What should I watch for while using this medicine? Visit your doctor for checks on your progress. This drug may make you feel generally unwell. This is not uncommon, as chemotherapy can affect healthy cells as well as cancer cells. Report any side effects. Continue your course of treatment even though you feel ill unless your doctor tells you to stop. In some cases, you may be given additional medicines to help with  side effects. Follow all directions for their use. Call your doctor or health care professional for advice if you get a fever, chills or sore throat, or other symptoms of a cold or flu. Do not treat yourself. This drug decreases  your body's ability to fight infections. Try to avoid being around people who are sick. This medicine may increase your risk to bruise or bleed. Call your doctor or health care professional if you notice any unusual bleeding. Be careful brushing and flossing your teeth or using a toothpick because you may get an infection or bleed more easily. If you have any dental work done, tell your dentist you are receiving this medicine. Avoid taking products that contain aspirin, acetaminophen, ibuprofen, naproxen, or ketoprofen unless instructed by your doctor. These medicines may hide a fever. Do not become pregnant while taking this medicine or for 6 months after stopping it. Women should inform their doctor if they wish to become pregnant or think they might be pregnant. Men should not father a child while taking this medicine and for 3 months after stopping it. There is a potential for serious side effects to an unborn child. Talk to your health care professional or pharmacist for more information. Do not breast-feed an infant while taking this medicine or for at least 1 week after stopping it. Men should inform their doctors if they wish to father a child. This medicine may lower sperm counts. Talk with your doctor or health care professional if you are concerned about your fertility. What side effects may I notice from receiving this medicine? Side effects that you should report to your doctor or health care professional as soon as possible:  allergic reactions like skin rash, itching or hives, swelling of the face, lips, or tongue  breathing problems  pain, redness, or irritation at site where injected  signs and symptoms of a dangerous change in heartbeat or heart rhythm like chest pain; dizziness; fast or irregular heartbeat; palpitations; feeling faint or lightheaded, falls; breathing problems  signs of decreased platelets or bleeding - bruising, pinpoint red spots on the skin, black, tarry stools,  blood in the urine  signs of decreased red blood cells - unusually weak or tired, feeling faint or lightheaded, falls  signs of infection - fever or chills, cough, sore throat, pain or difficulty passing urine  signs and symptoms of kidney injury like trouble passing urine or change in the amount of urine  signs and symptoms of liver injury like dark yellow or brown urine; general ill feeling or flu-like symptoms; light-colored stools; loss of appetite; nausea; right upper belly pain; unusually weak or tired; yellowing of the eyes or skin  swelling of ankles, feet, hands Side effects that usually do not require medical attention (report to your doctor or health care professional if they continue or are bothersome):  constipation  diarrhea  hair loss  loss of appetite  nausea  rash  vomiting This list may not describe all possible side effects. Call your doctor for medical advice about side effects. You may report side effects to FDA at 1-800-FDA-1088. Where should I keep my medicine? This drug is given in a hospital or clinic and will not be stored at home. NOTE: This sheet is a summary. It may not cover all possible information. If you have questions about this medicine, talk to your doctor, pharmacist, or health care provider.  2020 Elsevier/Gold Standard (2017-12-05 18:06:11)  Carboplatin injection What is this medicine? CARBOPLATIN (KAR boe pla tin)  is a chemotherapy drug. It targets fast dividing cells, like cancer cells, and causes these cells to die. This medicine is used to treat ovarian cancer and many other cancers. This medicine may be used for other purposes; ask your health care provider or pharmacist if you have questions. COMMON BRAND NAME(S): Paraplatin What should I tell my health care provider before I take this medicine? They need to know if you have any of these conditions:  blood disorders  hearing problems  kidney disease  recent or ongoing  radiation therapy  an unusual or allergic reaction to carboplatin, cisplatin, other chemotherapy, other medicines, foods, dyes, or preservatives  pregnant or trying to get pregnant  breast-feeding How should I use this medicine? This drug is usually given as an infusion into a vein. It is administered in a hospital or clinic by a specially trained health care professional. Talk to your pediatrician regarding the use of this medicine in children. Special care may be needed. Overdosage: If you think you have taken too much of this medicine contact a poison control center or emergency room at once. NOTE: This medicine is only for you. Do not share this medicine with others. What if I miss a dose? It is important not to miss a dose. Call your doctor or health care professional if you are unable to keep an appointment. What may interact with this medicine?  medicines for seizures  medicines to increase blood counts like filgrastim, pegfilgrastim, sargramostim  some antibiotics like amikacin, gentamicin, neomycin, streptomycin, tobramycin  vaccines Talk to your doctor or health care professional before taking any of these medicines:  acetaminophen  aspirin  ibuprofen  ketoprofen  naproxen This list may not describe all possible interactions. Give your health care provider a list of all the medicines, herbs, non-prescription drugs, or dietary supplements you use. Also tell them if you smoke, drink alcohol, or use illegal drugs. Some items may interact with your medicine. What should I watch for while using this medicine? Your condition will be monitored carefully while you are receiving this medicine. You will need important blood work done while you are taking this medicine. This drug may make you feel generally unwell. This is not uncommon, as chemotherapy can affect healthy cells as well as cancer cells. Report any side effects. Continue your course of treatment even though you feel  ill unless your doctor tells you to stop. In some cases, you may be given additional medicines to help with side effects. Follow all directions for their use. Call your doctor or health care professional for advice if you get a fever, chills or sore throat, or other symptoms of a cold or flu. Do not treat yourself. This drug decreases your body's ability to fight infections. Try to avoid being around people who are sick. This medicine may increase your risk to bruise or bleed. Call your doctor or health care professional if you notice any unusual bleeding. Be careful brushing and flossing your teeth or using a toothpick because you may get an infection or bleed more easily. If you have any dental work done, tell your dentist you are receiving this medicine. Avoid taking products that contain aspirin, acetaminophen, ibuprofen, naproxen, or ketoprofen unless instructed by your doctor. These medicines may hide a fever. Do not become pregnant while taking this medicine. Women should inform their doctor if they wish to become pregnant or think they might be pregnant. There is a potential for serious side effects to an unborn child. Talk  to your health care professional or pharmacist for more information. Do not breast-feed an infant while taking this medicine. What side effects may I notice from receiving this medicine? Side effects that you should report to your doctor or health care professional as soon as possible:  allergic reactions like skin rash, itching or hives, swelling of the face, lips, or tongue  signs of infection - fever or chills, cough, sore throat, pain or difficulty passing urine  signs of decreased platelets or bleeding - bruising, pinpoint red spots on the skin, black, tarry stools, nosebleeds  signs of decreased red blood cells - unusually weak or tired, fainting spells, lightheadedness  breathing problems  changes in hearing  changes in vision  chest pain  high blood  pressure  low blood counts - This drug may decrease the number of white blood cells, red blood cells and platelets. You may be at increased risk for infections and bleeding.  nausea and vomiting  pain, swelling, redness or irritation at the injection site  pain, tingling, numbness in the hands or feet  problems with balance, talking, walking  trouble passing urine or change in the amount of urine Side effects that usually do not require medical attention (report to your doctor or health care professional if they continue or are bothersome):  hair loss  loss of appetite  metallic taste in the mouth or changes in taste This list may not describe all possible side effects. Call your doctor for medical advice about side effects. You may report side effects to FDA at 1-800-FDA-1088. Where should I keep my medicine? This drug is given in a hospital or clinic and will not be stored at home. NOTE: This sheet is a summary. It may not cover all possible information. If you have questions about this medicine, talk to your doctor, pharmacist, or health care provider.  2020 Elsevier/Gold Standard (2007-12-17 14:38:05)

## 2019-05-13 NOTE — Progress Notes (Signed)
[place] Work  Clinical Social Work was informed that while patient was in the hospital home health services here denied due to staffing and/or being out of network.  CSW spoke with in patient case Freight forwarder.  Case manager informed CSW that a new box of supplies has been ordered and is being delivered to the 5th floor at [facility].  CSW shared this information with [facility] RN.  CSW also shared denial of home health services and payor issues with [facility] leadership.     Johnnye Lana, MSW, LCSW, OSW-C Clinical Social Worker [facility] [phone]

## 2019-05-13 NOTE — Progress Notes (Signed)
Patient presented with sisters Neoma Laming and [place]) to clinic for OV/infusion with c/o dyspnea at rest worsening with exertion. Skin cool and diaphoretic. Accessed Pleurx drain per policy with 409 mL dark amber fluid removed. Procedure teaching performed with sisters. Patient tolerated procedure well. Patient verbalized moderate improvement in dyspnea post-procedure. [name] aware and came to room to evaluate.

## 2019-05-13 NOTE — Progress Notes (Signed)
Change in SCr today; CrCl = 72 mL/min.  MD wants Carbo dose adjusted.  Dose today will be 190 mg. Kennith Center, Pharm.D., CPP 05/13/2019@12 :22 PM

## 2019-05-14 ENCOUNTER — Other Ambulatory Visit: Payer: Self-pay

## 2019-05-14 ENCOUNTER — Inpatient Hospital Stay: Payer: BC Managed Care – PPO

## 2019-05-14 ENCOUNTER — Telehealth: Payer: Self-pay | Admitting: *Deleted

## 2019-05-14 VITALS — BP 123/84 | HR 120 | Temp 98.5°F | Resp 20

## 2019-05-14 DIAGNOSIS — C7800 Secondary malignant neoplasm of unspecified lung: Secondary | ICD-10-CM

## 2019-05-14 DIAGNOSIS — Z5112 Encounter for antineoplastic immunotherapy: Secondary | ICD-10-CM | POA: Diagnosis not present

## 2019-05-14 DIAGNOSIS — C50511 Malignant neoplasm of lower-outer quadrant of right female breast: Secondary | ICD-10-CM

## 2019-05-14 LAB — COMPREHENSIVE METABOLIC PANEL
ALT: 23 U/L (ref 0–44)
AST: 22 U/L (ref 15–41)
Albumin: 2.5 g/dL — ABNORMAL LOW (ref 3.5–5.0)
Alkaline Phosphatase: 142 U/L — ABNORMAL HIGH (ref 38–126)
Anion gap: 14 (ref 5–15)
BUN: 30 mg/dL — ABNORMAL HIGH (ref 6–20)
CO2: 22 mmol/L (ref 22–32)
Calcium: 7.3 mg/dL — ABNORMAL LOW (ref 8.9–10.3)
Chloride: 106 mmol/L (ref 98–111)
Creatinine, Ser: 1.33 mg/dL — ABNORMAL HIGH (ref 0.44–1.00)
GFR calc Af Amer: 54 mL/min — ABNORMAL LOW (ref >60)
GFR calc non Af Amer: 47 mL/min — ABNORMAL LOW (ref >60)
Glucose, Bld: 112 mg/dL — ABNORMAL HIGH (ref 70–99)
Potassium: 5.2 mmol/L — ABNORMAL HIGH (ref 3.5–5.1)
Sodium: 142 mmol/L (ref 135–145)
Total Bilirubin: 0.4 mg/dL (ref 0.3–1.2)
Total Protein: 6.9 g/dL (ref 6.5–8.1)

## 2019-05-14 LAB — CBC WITH DIFFERENTIAL/PLATELET
Abs Immature Granulocytes: 0.14 10*3/uL — ABNORMAL HIGH (ref 0.00–0.07)
Basophils Absolute: 0 10*3/uL (ref 0.0–0.1)
Basophils Relative: 0 %
Eosinophils Absolute: 0 10*3/uL (ref 0.0–0.5)
Eosinophils Relative: 0 %
HCT: 38.7 % (ref 36.0–46.0)
Hemoglobin: 11.5 g/dL — ABNORMAL LOW (ref 12.0–15.0)
Immature Granulocytes: 1 %
Lymphocytes Relative: 6 %
Lymphs Abs: 1.2 10*3/uL (ref 0.7–4.0)
MCH: 24.1 pg — ABNORMAL LOW (ref 26.0–34.0)
MCHC: 29.7 g/dL — ABNORMAL LOW (ref 30.0–36.0)
MCV: 81.1 fL (ref 80.0–100.0)
Monocytes Absolute: 1.5 10*3/uL — ABNORMAL HIGH (ref 0.1–1.0)
Monocytes Relative: 8 %
Neutro Abs: 16.9 10*3/uL — ABNORMAL HIGH (ref 1.7–7.7)
Neutrophils Relative %: 85 %
Platelets: 530 10*3/uL — ABNORMAL HIGH (ref 150–400)
RBC: 4.77 MIL/uL (ref 3.87–5.11)
RDW: 20.7 % — ABNORMAL HIGH (ref 11.5–15.5)
WBC: 19.8 10*3/uL — ABNORMAL HIGH (ref 4.0–10.5)
nRBC: 0.1 % (ref 0.0–0.2)

## 2019-05-14 MED ORDER — SODIUM CHLORIDE 0.9 % IV SOLN
INTRAVENOUS | Status: DC
  Administered 2019-05-14: 11:00:00 via INTRAVENOUS
  Filled 2019-05-14 (×2): qty 250

## 2019-05-14 MED ORDER — HEPARIN SOD (PORK) LOCK FLUSH 100 UNIT/ML IV SOLN
500.0000 [IU] | Freq: Once | INTRAVENOUS | Status: AC
  Administered 2019-05-14: 500 [IU] via INTRAVENOUS
  Filled 2019-05-14: qty 5

## 2019-05-14 MED ORDER — SODIUM CHLORIDE 0.9% FLUSH
10.0000 mL | Freq: Once | INTRAVENOUS | Status: AC
  Administered 2019-05-14: 10 mL via INTRAVENOUS
  Filled 2019-05-14: qty 10

## 2019-05-14 NOTE — Progress Notes (Signed)
Accessed pleural catheter per policy and drained 286 mL amber fluid. Patient tolerated procedure well. Sterile, occlusive dressing applied. Beverly, patient's sister, at chairside to watch procedure and verbalized increased comfort with performing procedure.

## 2019-05-14 NOTE — Patient Instructions (Signed)
Rehydration, Adult Rehydration is the replacement of body fluids and salts and minerals (electrolytes) that are lost during dehydration. Dehydration is when there is not enough fluid or water in the body. This happens when you lose more fluids than you take in. Common causes of dehydration include:  Vomiting.  Diarrhea.  Excessive sweating, such as from heat exposure or exercise.  Taking medicines that cause the body to lose excess fluid (diuretics).  Impaired kidney function.  Not drinking enough fluid.  Certain illnesses or infections.  Certain poorly controlled long-term (chronic) illnesses, such as diabetes, heart disease, and kidney disease.  Symptoms of mild dehydration may include thirst, dry lips and mouth, dry skin, and dizziness. Symptoms of severe dehydration may include increased heart rate, confusion, fainting, and not urinating. You can rehydrate by drinking certain fluids or getting fluids through an IV tube, as told by your health care provider. What are the risks? Generally, rehydration is safe. However, one problem that can happen is taking in too much fluid (overhydration). This is rare. If overhydration happens, it can cause an electrolyte imbalance, kidney failure, or a decrease in salt (sodium) levels in the body. How to rehydrate Follow instructions from your health care provider for rehydration. The kind of fluid you should drink and the amount you should drink depend on your condition.  If directed by your health care provider, drink an oral rehydration solution (ORS). This is a drink designed to treat dehydration that is found in pharmacies and retail stores. ? Make an ORS by following instructions on the package. ? Start by drinking small amounts, about  cup (120 mL) every 5-10 minutes. ? Slowly increase how much you drink until you have taken the amount recommended by your health care provider.  Drink enough clear fluids to keep your urine clear or pale  yellow. If you were instructed to drink an ORS, finish the ORS first, then start slowly drinking other clear fluids. Drink fluids such as: ? Water. Do not drink only water. Doing that can lead to having too little sodium in your body (hyponatremia). ? Ice chips. ? Fruit juice that you have added water to (diluted juice). ? Low-calorie sports drinks.  If you are severely dehydrated, your health care provider may recommend that you receive fluids through an IV tube in the hospital.  Do not take sodium tablets. Doing that can lead to the condition of having too much sodium in your body (hypernatremia). Eating while you rehydrate Follow instructions from your health care provider about what to eat while you rehydrate. Your health care provider may recommend that you slowly begin eating regular foods in small amounts.  Eat foods that contain a healthy balance of electrolytes, such as bananas, oranges, potatoes, tomatoes, and spinach.  Avoid foods that are greasy or contain a lot of fat or sugar.  In some cases, you may get nutrition through a feeding tube that is passed through your nose and into your stomach (nasogastric tube, or NG tube). This may be done if you have uncontrolled vomiting or diarrhea. Beverages to avoid Certain beverages may make dehydration worse. While you rehydrate, avoid:  Alcohol.  Caffeine.  Drinks that contain a lot of sugar. These include: ? High-calorie sports drinks. ? Fruit juice that is not diluted. ? Soda.  Check nutrition labels to see how much sugar or caffeine a beverage contains. Signs of dehydration recovery You may be recovering from dehydration if:  You are urinating more often than before you started   rehydrating.  Your urine is clear or pale yellow.  Your energy level improves.  You vomit less frequently.  You have diarrhea less frequently.  Your appetite improves or returns to normal.  You feel less dizzy or less light-headed.  Your  skin tone and color start to look more normal. Contact a health care provider if:  You continue to have symptoms of mild dehydration, such as: ? Thirst. ? Dry lips. ? Slightly dry mouth. ? Dry, warm skin. ? Dizziness.  You continue to vomit or have diarrhea. Get help right away if:  You have symptoms of dehydration that get worse.  You feel: ? Confused. ? Weak. ? Like you are going to faint.  You have not urinated in 6-8 hours.  You have very dark urine.  You have trouble breathing.  Your heart rate while sitting still is over 100 beats a minute.  You cannot drink fluids without vomiting.  You have vomiting or diarrhea that: ? Gets worse. ? Does not go away.  You have a fever. This information is not intended to replace advice given to you by your health care provider. Make sure you discuss any questions you have with your health care provider. Document Released: 12/04/2011 Document Revised: 08/24/2017 Document Reviewed: 11/05/2015 Elsevier Patient Education  2020 Elsevier Inc.  Coronavirus (COVID-19) Are you at risk?  Are you at risk for the Coronavirus (COVID-19)?  To be considered HIGH RISK for Coronavirus (COVID-19), you have to meet the following criteria:  . Traveled to [place], [place], [name], [place] or [place]; or in the [place] to [place], [place], [place], or [place]; and have fever, cough, and shortness of breath within the last 2 weeks of travel OR . Been in close contact with a person diagnosed with COVID-19 within the last 2 weeks and have fever, cough, and shortness of breath . IF YOU DO NOT MEET THESE CRITERIA, YOU ARE CONSIDERED LOW RISK FOR COVID-19.  What to do if you are HIGH RISK for COVID-19?  . If you are having a medical emergency, call 911. . Seek medical care right away. Before you go to a doctor's office, urgent care or emergency department, call ahead and tell them about your recent travel, contact with someone diagnosed  with COVID-19, and your symptoms. You should receive instructions from your physician's office regarding next steps of care.  . When you arrive at healthcare provider, tell the healthcare staff immediately you have returned from visiting [place], [place], [place], [place] or [name]; or traveled in the [place] to [place], [place], [place], or [place]; in the last two weeks or you have been in close contact with a person diagnosed with COVID-19 in the last 2 weeks.   . Tell the health care staff about your symptoms: fever, cough and shortness of breath. . After you have been seen by a medical provider, you will be either: o Tested for (COVID-19) and discharged home on quarantine except to seek medical care if symptoms worsen, and asked to  - Stay home and avoid contact with others until you get your results (4-5 days)  - Avoid travel on public transportation if possible (such as bus, train, or airplane) or o Sent to the Emergency Department by EMS for evaluation, COVID-19 testing, and possible admission depending on your condition and test results.  What to do if you are LOW RISK for COVID-19?  Reduce your risk of any infection by using the same precautions   used for avoiding the common cold or flu:  . Wash your hands often with soap and warm water for at least 20 seconds.  If soap and water are not readily available, use an alcohol-based hand sanitizer with at least 60% alcohol.  . If coughing or sneezing, cover your mouth and nose by coughing or sneezing into the elbow areas of your shirt or coat, into a tissue or into your sleeve (not your hands). . Avoid shaking hands with others and consider head nods or verbal greetings only. . Avoid touching your eyes, nose, or mouth with unwashed hands.  . Avoid close contact with people who are sick. . Avoid places or events with large numbers of people in one location, like concerts or sporting events. . Carefully consider travel plans you have  or are making. . If you are planning any travel outside or inside the [place], visit the CDC's Travelers' Health webpage for the latest health notices. . If you have some symptoms but not all symptoms, continue to monitor at home and seek medical attention if your symptoms worsen. . If you are having a medical emergency, call 911.   ADDITIONAL HEALTHCARE OPTIONS FOR PATIENTS  [facility] / e-Visit: [email or web address]         [facility]: [phone]  [facility]: [phone]                   [facility]: [phone]   

## 2019-05-14 NOTE — Telephone Encounter (Signed)
-----   Message from Teodoro Spray, RN sent at 05/13/2019  3:25 PM EDT ----- Regarding: Dr. Reinaldo Meeker time chemo followup [name], first time gemzar. Pt tolerated treatment well.  Thank you

## 2019-05-14 NOTE — Telephone Encounter (Signed)
Attempted to call pt at her mobile # & husband at his mobile # & no answer.  Trying to see how pt did with treatment yesterday.  Left message at both #'s to call back.

## 2019-05-14 NOTE — Progress Notes (Signed)
Verbal order from [name]: okay to treat patient with HR of 120

## 2019-05-15 ENCOUNTER — Telehealth: Payer: Self-pay | Admitting: *Deleted

## 2019-05-15 ENCOUNTER — Telehealth: Payer: Self-pay | Admitting: Oncology

## 2019-05-15 ENCOUNTER — Encounter: Payer: Self-pay | Admitting: *Deleted

## 2019-05-15 NOTE — Telephone Encounter (Signed)
Per discussion this AM with pt and sister at time of nurse education and evaluation of PleureX draining ( see other note ),  [name] stated need for extension of her LOA to be " longterm".  Pt states " they told me you just needed to add it to the prior form and resent it "  This RN noted [employer] disability form documented as received and completed 04/24/2019 but could not locate copy of form in chart.  This RN will inquire with HIM per documentation showing completed form sent to them on 04/28/2019.

## 2019-05-15 NOTE — Progress Notes (Signed)
This RN observed pt's sister, Pollyann Kennedy, provide care for Pleurex catheter care and drainage. Beverly demonstrated appropriate technique per protocol.  Pt was drained of 250 ml of light pink fluid per catheter. Pt tolerated procedure well and was able to inform sister when she felt increased discomfort- relieved when sister slowed drip rate.  Noted drip slowing down at approximately 250 ml mark with pt stating noted discomfort.  Beverly clamped drip with relief of discomfort. PleureX drainage bottle d/ced.  Sister then provided appropriate new dressing to tube insertion site.  Pt monitored for 30 minutes in this office with no complaints.  Questions answered and video and brochure with DVD for home education and support,  Pt discharged with sister in wheelchair.

## 2019-05-15 NOTE — Telephone Encounter (Signed)
I talk with patient regarding schedule  

## 2019-05-16 ENCOUNTER — Encounter: Payer: Self-pay | Admitting: *Deleted

## 2019-05-18 NOTE — Progress Notes (Signed)
[place]  [phone] [phone]    ID: [name] DOB: 1971/02/01  MR#: [id number]  FXJ#:[id number]  Patient Care Team: [name], MD as PCP - [name], MD as Consulting Physician (General Surgery) Artice Bergerson, [name], MD as Consulting Physician (Oncology) [name], MD as Attending Physician (Radiation Oncology) [name], MD as Consulting Physician (Gastroenterology) [name], MD as Consulting Physician (Obstetrics and Gynecology) [name], RN as Oncology Nurse Navigator [name], RN as Oncology Nurse Navigator [name], MD as Consulting Physician (Plastic Surgery) OTHER MD: Dr. Siri Cole (urology)    CHIEF COMPLAINT: Triple negative breast cancer  CURRENT TREATMENT: pembrolizumab; carboplatin, gemcitabine   INTERVAL HISTORY: Brittany Rodgers Rodgers returns today for follow-up and treatment of her triple negative breast cancer.    She was started on carboplatin and gemcitabine on 05/13/2019. Today is day 8, cycle 1.  She generally tolerated treatment well and tells me she has had no nausea or vomiting.  She has altered taste.  She is not eating very much.  She is taking "a lot of medications at home" and she is trying to get them straight she says.  On 05/14/2019, 350 mL of amber fluid was drained from her pleural catheter.  On 05/15/2019, Brittany Rodgers's sister, Brittany Rodgers Rodgers (who is a CNA), demonstrated appropriate pleural catheter care and drainage and drained 250 mL of light pink fluid.  She is currently having her right effusion drained every other day by her sister.   REVIEW OF SYSTEMS: Brittany Rodgers Rodgers tells me her breathing is "good".  She does not use the oxygen all the time but only when she feels she needs it.  She denies pleurisy, or cough.  She denies pain at present.  She is not constipated.  She denies unusual headaches, visual changes, or recent falls.  A detailed review of systems was otherwise  stable.    HISTORY OF CURRENT ILLNESS: From the original intake note:  Brittany Rodgers Rodgers presented with a palpable right breast lump and associated tenderness for approximately 1 week. She called her doctor immediately following  She underwent unilateral right diagnostic mammography with tomography and right breast ultrasonography at [facility] on 10/28/2018 showing: Breast Density Category C. Radiopaque BB was placed at the site of the patient's palpable lump in the central right breast. An irregular hyperdense mass with associated increased trabecular thickening is seen in the deep central right breast. On physical exam, a firm, fixed mass in the central right breast is palpated. Target ultrasound is performed, showing and irregular hypoechoic mass at the 8 o'clock position 3 cm from the nipple. It measures 3.6 x 2.9 x 2.3 cm. There is associated peripheral vascularity. Evaluation of the right axilla demonstrates a single prominent lymph node demonstrating 0.4 cm cortical thickening. The remaining lymph nodes ave normal appearance with a thin, symmetric cortex.   Accordingly on 10/29/2018 she proceeded to biopsy of the right breast area in question. The pathology from this procedure showed ([id number]): invasive ductal carcinoma, grade III. Prognostic indicators significant for: estrogen receptor, 0% negative and progesterone receptor, 0% negative. Proliferation marker Ki67 at 80%. HER2 negative (1+) by immunohistochemistry.  Biopsy of the right axilla was performed on the same day. The pathology from this procedure showed ([id number]): lymph node, negative for carcinoma.  This was felt to be concordant  The patient's subsequent history is as detailed below.   PAST MEDICAL HISTORY: Past Medical History:  Diagnosis Date   Cancer ([place])  BREAST CANCER RIGHT BREAST   Chronic cough    Family history of breast cancer    GERD (gastroesophageal reflux disease)    Hyperlipidemia    IBS  (irritable bowel syndrome)    sees Dr. Silvano Rusk    Routine gynecological examination    sees Dr. Freda Munro     PAST SURGICAL HISTORY: Past Surgical History:  Procedure Laterality Date   CESAREAN SECTION     IR PERC PLEURAL DRAIN W/INDWELL CATH W/IMG GUIDE  05/01/2019   MASTECTOMY W/ SENTINEL NODE BIOPSY Right 03/04/2019   Procedure: RIGHT MASTECTOMY WITH RIGHT AXILLARY SENTINEL LYMPH NODE BIOPSY;  Surgeon: Rolm Bookbinder, MD;  Location: [facility];  Service: General;  Laterality: Right;   PORTACATH PLACEMENT Right 11/14/2018   Procedure: INSERTION PORT-A-CATH WITH ULTRASOUND;  Surgeon: Rolm Bookbinder, MD;  Location: [facility];  Service: General;  Laterality: Right;   ROOT CANAL       FAMILY HISTORY: Family History  Problem Relation Age of Onset   Hypertension Other    Breast cancer Other        mat first cousin's daughter   Breast cancer Maternal Aunt    Hypertension Mother    Breast cancer Maternal Aunt    Breast cancer Maternal Aunt    Brittany Rodgers Rodgers's father died from an unknown cause at age 73. [name] died from unknown health complications at age 74. The patient has 4 sisters. Brittany Rodgers Rodgers has 3 maternal aunts that have been diagnosed with breast cancer. Patient denies anyone in her family having ovarian, prostate, or pancreatic cancer.    GYNECOLOGIC HISTORY:  No LMP recorded. (Menstrual status: Chemotherapy). 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 Her husband, Brittany Rodgers Rodgers, is a Administrator. The patient has two children, Brittany Rodgers and Brittany Rodgers Rodgers, from another relationship. Brittany Rodgers is 50, lives in [place]. Brittany Rodgers Rodgers is 109, lives in [place], and is in [employer].  The patient's husband Brittany Rodgers Rodgers has a child, Brittany Rodgers, from another relationship; she lives in  [place] and is in school. The patient has 5 grandchildren. She attends the [employer].   ADVANCED DIRECTIVES: Brittany Rodgers's husband, Brittany Rodgers Rodgers, is automatically her healthcare power of attorney.     HEALTH MAINTENANCE: Social History   Tobacco Use   Smoking status: Never Smoker   Smokeless tobacco: Never Used  Substance Use Topics   Alcohol use: No    Alcohol/week: 0.0 standard drinks   Drug use: No    Colonoscopy: yes  PAP: 02/2018  Bone density: no   Allergies  Allergen Reactions   Barbiturates Other (See Comments)    Keeps pt awake   Methocarbamol Itching   Sulfonamide Derivatives Itching    Current Outpatient Medications  Medication Sig Dispense Refill   benzonatate (TESSALON) 100 MG capsule Take 1 capsule (100 mg total) by mouth 3 (three) times daily as needed for cough. 20 capsule 0   docusate sodium (COLACE) 100 MG capsule Take 2 capsules (200 mg total) by mouth 2 (two) times daily. 10 capsule 0   lidocaine-prilocaine (EMLA) cream Apply to affected area once 30 g 3   metoprolol tartrate (LOPRESSOR) 50 MG tablet Take 1 tablet (50 mg total) by mouth 2 (two) times daily. 60 tablet 0   morphine (MS CONTIN) 15 MG 12 hr tablet Take 1 tablet (15 mg  total) by mouth every 12 (twelve) hours. 60 tablet 0   morphine (MSIR) 15 MG tablet Take 1 tablet (15 mg total) by mouth every 4 (four) hours as needed for moderate pain. 30 tablet 0   Omeprazole-Sodium Bicarbonate (ZEGERID) 20-1100 MG CAPS capsule Take 1 capsule by mouth daily before breakfast. 90 each 3   polyethylene glycol (MIRALAX / GLYCOLAX) 17 g packet Take 17 g by mouth 2 (two) times daily. 14 each 0   prochlorperazine (COMPAZINE) 10 MG tablet Take 1 tablet (10 mg total) by mouth every 6 (six) hours as needed for nausea or vomiting. 30 tablet 0   senna-docusate (SENOKOT-S) 8.6-50 MG tablet Take 2 tablets by mouth 2 (two) times daily. 30 tablet 0   No current facility-administered medications for this visit.       OBJECTIVE: Young African-American woman examined in a wheelchair Vitals:   05/19/19 1252  BP: 109/77  Pulse: (!) 106  Resp: 18  Temp: (!) 97.5 F (36.4 C)  SpO2: 99%     Body mass index is 27.36 kg/m.   Wt Readings from Last 3 Encounters:  05/19/19 149 lb 9.6 oz (67.9 kg)  05/07/19 175 lb 4.3 oz (79.5 kg)  04/23/19 172 lb 14.4 oz (78.4 kg)   ECOG FS:3 - Symptomatic, >50% confined to bed  Sclerae unicteric, EOMs intact Wearing a mask No cervical or supraclavicular adenopathy Lungs no rales or rhonchi Heart regular rate and rhythm Abd soft, nontender, positive bowel sounds MSK no focal spinal tenderness, no upper extremity lymphedema Neuro: nonfocal, well oriented, appropriate affect Breasts: The right breast is status post mastectomy.  The incision is unchanged from prior  LAB RESULTS:  CMP     Component Value Date/Time   NA 136 05/19/2019 1220   K 4.0 05/19/2019 1220   CL 97 (L) 05/19/2019 1220   CO2 23 05/19/2019 1220   GLUCOSE 108 (H) 05/19/2019 1220   BUN 23 (H) 05/19/2019 1220   CREATININE 1.17 (H) 05/19/2019 1220   CREATININE 0.73 02/11/2019 0859   CALCIUM 10.4 (H) 05/19/2019 1220   PROT 7.4 05/19/2019 1220   ALBUMIN 2.6 (L) 05/19/2019 1220   AST 30 05/19/2019 1220   AST 6 (L) 02/11/2019 0859   ALT 96 (H) 05/19/2019 1220   ALT 20 02/11/2019 0859   ALKPHOS 167 (H) 05/19/2019 1220   BILITOT 0.4 05/19/2019 1220   BILITOT 0.3 02/11/2019 0859   GFRNONAA 55 (L) 05/19/2019 1220   GFRNONAA >60 02/11/2019 0859   GFRAA >60 05/19/2019 1220   GFRAA >60 02/11/2019 0859    No results found for: TOTALPROTELP, ALBUMINELP, A1GS, A2GS, BETS, BETA2SER, GAMS, MSPIKE, SPEI  No results found for: KPAFRELGTCHN, LAMBDASER, KAPLAMBRATIO  Lab Results  Component Value Date   WBC 2.9 (L) 05/19/2019   NEUTROABS 1.5 (L) 05/19/2019   HGB 12.4 05/19/2019   HCT 41.9 05/19/2019   MCV 79.5 (L) 05/19/2019   PLT 289 05/19/2019    _0 @  No results found for:  LABCA2  No components found for: UTMLYY503  No results for input(s): INR in the last 168 hours.  No results found for: LABCA2  No results found for: TWS568  No results found for: LEX517  No results found for: GYF749  Lab Results  Component Value Date   CA2729 17.6 04/02/2019    No components found for: HGQUANT  No results found for: CEA1 / No results found for: CEA1   No results found for: AFPTUMOR  No results found for: [facility]  No results found for: PSA1  Appointment on 05/19/2019  Component Date Value Ref Range Status   WBC 05/19/2019 2.9* 4.0 - 10.5 K/uL Final   RBC 05/19/2019 5.27* 3.87 - 5.11 MIL/uL Final   Hemoglobin 05/19/2019 12.4  12.0 - 15.0 g/dL Final   HCT 05/19/2019 41.9  36.0 - 46.0 % Final   MCV 05/19/2019 79.5* 80.0 - 100.0 fL Final   MCH 05/19/2019 23.5* 26.0 - 34.0 pg Final   MCHC 05/19/2019 29.6* 30.0 - 36.0 g/dL Final   RDW 05/19/2019 20.2* 11.5 - 15.5 % Final   Platelets 05/19/2019 289  150 - 400 K/uL Final   nRBC 05/19/2019 0.0  0.0 - 0.2 % Final   Neutrophils Relative % 05/19/2019 51  % Final   Neutro Abs 05/19/2019 1.5* 1.7 - 7.7 K/uL Final   Lymphocytes Relative 05/19/2019 43  % Final   Lymphs Abs 05/19/2019 1.3  0.7 - 4.0 K/uL Final   Monocytes Relative 05/19/2019 3  % Final   Monocytes Absolute 05/19/2019 0.1  0.1 - 1.0 K/uL Final   Eosinophils Relative 05/19/2019 2  % Final   Eosinophils Absolute 05/19/2019 0.1  0.0 - 0.5 K/uL Final   Basophils Relative 05/19/2019 1  % Final   Basophils Absolute 05/19/2019 0.0  0.0 - 0.1 K/uL Final   Immature Granulocytes 05/19/2019 0  % Final   Abs Immature Granulocytes 05/19/2019 0.01  0.00 - 0.07 K/uL Final   Performed at [facility], [place] [address], [place], [place] [address]   Sodium 05/19/2019 136  135 - 145 mmol/L Final   Potassium 05/19/2019 4.0  3.5 - 5.1 mmol/L Final   Chloride 05/19/2019 97* 98 - 111 mmol/L Final   CO2 05/19/2019 23   22 - 32 mmol/L Final   Glucose, Bld 05/19/2019 108* 70 - 99 mg/dL Final   BUN 05/19/2019 23* 6 - 20 mg/dL Final   Creatinine, Ser 05/19/2019 1.17* 0.44 - 1.00 mg/dL Final   Calcium 05/19/2019 10.4* 8.9 - 10.3 mg/dL Final   Total Protein 05/19/2019 7.4  6.5 - 8.1 g/dL Final   Albumin 05/19/2019 2.6* 3.5 - 5.0 g/dL Final   AST 05/19/2019 30  15 - 41 U/L Final   ALT 05/19/2019 96* 0 - 44 U/L Final   Alkaline Phosphatase 05/19/2019 167* 38 - 126 U/L Final   Total Bilirubin 05/19/2019 0.4  0.3 - 1.2 mg/dL Final   GFR calc non Af Amer 05/19/2019 55* >60 mL/min Final   GFR calc Af Amer 05/19/2019 >60  >60 mL/min Final   Anion gap 05/19/2019 16* 5 - 15 Final   Performed at [facility], [address] [place], [place] [address]    (this displays the last labs from the last 3 days)  No results found for: TOTALPROTELP, ALBUMINELP, A1GS, A2GS, BETS, BETA2SER, GAMS, MSPIKE, SPEI (this displays SPEP labs)  No results found for: KPAFRELGTCHN, LAMBDASER, KAPLAMBRATIO (kappa/lambda light chains)  No results found for: HGBA, HGBA2QUANT, HGBFQUANT, HGBSQUAN (Hemoglobinopathy evaluation)   No results found for: LDH  No results found for: IRON, TIBC, IRONPCTSAT (Iron and TIBC)  No results found for: FERRITIN  Urinalysis    Component Value Date/Time   COLORURINE YELLOW 05/12/2019 [place] 05/12/2019 0724   LABSPEC 1.012 05/12/2019 [place] 7.0 05/12/2019 [place] 05/12/2019 [place] 05/12/2019 [place] 05/12/2019 0724   BILIRUBINUR negative 05/31/2018 1011  KETONESUR NEGATIVE 05/12/2019 0724   PROTEINUR NEGATIVE 05/12/2019 0724   UROBILINOGEN 0.2 05/31/2018 1011   UROBILINOGEN 0.2 05/18/2010 1042   NITRITE NEGATIVE 05/12/2019 0724   LEUKOCYTESUR TRACE (A) 05/12/2019 0724     STUDIES:  Dg Chest 2 View  Result Date: 05/02/2019 CLINICAL DATA:  Fever, RIGHT-side pleural  drainage catheter placed yesterday EXAM: CHEST - 2 VIEW COMPARISON:  04/25/2019 Correlation: CTA chest 04/23/2019 FINDINGS: RIGHT jugular Port-A-Cath with tip projecting over SVC. New RIGHT PleurX catheter. Enlargement of cardiac silhouette. Known RIGHT infrahilar mass incompletely visualized due to partial obscuration by moderate to large RIGHT pleural effusion and basilar atelectasis. LEFT lung clear. No pneumothorax. Bone destruction of the anterior RIGHT fourth and fifth ribs. IMPRESSION: Enlargement of cardiac silhouette. Known RIGHT perihilar mass with bone destruction of the anterior RIGHT fourth and fifth ribs. Large RIGHT pleural effusion and significant basilar atelectasis. Electronically Signed   By: Lavonia Dana M.D.   On: 05/02/2019 08:52   Dg Chest 2 View  Result Date: 04/23/2019 CLINICAL DATA:  Shortness of breath, tachycardia and lethargy. History of metastatic breast cancer. EXAM: CHEST - 2 VIEW COMPARISON:  Chest x-ray 04/16/2019 FINDINGS: The power port is stable. Persistent right lower lobe process which appears to be a combination effusion and atelectasis based on prior chest CT. There is also persistent streaky left basilar atelectasis. IMPRESSION: 1. Stable/persistent right lower lobe process likely combination of effusion and atelectasis. 2. Stable streaky left basilar atelectasis. Electronically Signed   By: Marijo Sanes M.D.   On: 04/23/2019 11:27   Ct Head Wo Contrast  Result Date: 05/02/2019 CLINICAL DATA:  Altered mental status, metastatic breast cancer EXAM: CT HEAD WITHOUT CONTRAST TECHNIQUE: Contiguous axial images were obtained from the base of the skull through the vertex without intravenous contrast. COMPARISON:  05/17/2010 FINDINGS: Brain: No evidence of acute infarction, hemorrhage, hydrocephalus, extra-axial collection or mass lesion/mass effect. Periventricular and deep white matter hypodensity. Vascular: No hyperdense vessel or unexpected calcification. Skull: Normal.  Negative for fracture or focal lesion. Sinuses/Orbits: No acute finding. Other: None. IMPRESSION: No acute intracranial pathology. Periventricular and deep white matter hypodensity which is advanced for patient age although may nonetheless reflect small-vessel white matter disease. No noncontrast CT evidence of intracranial metastatic disease. Consider contrast enhanced MRI to more sensitively evaluate for metastatic disease, if suspected. Electronically Signed   By: Eddie Candle M.D.   On: 05/02/2019 16:40   Ct Angio Chest Pe W/cm &/or Wo Cm  Result Date: 04/23/2019 CLINICAL DATA:  Shortness of breath, history of metastatic breast cancer EXAM: CT ANGIOGRAPHY CHEST WITH CONTRAST TECHNIQUE: Multidetector CT imaging of the chest was performed using the standard protocol during bolus administration of intravenous contrast. Multiplanar CT image reconstructions and MIPs were obtained to evaluate the vascular anatomy. CONTRAST:  146m OMNIPAQUE IOHEXOL 350 MG/ML SOLN COMPARISON:  04/11/2019 FINDINGS: Cardiovascular: Satisfactory opacification of the pulmonary arteries to the segmental level. No evidence of pulmonary embolism. Normal heart size. No pericardial effusion. Right chest port catheter. Mediastinum/Nodes: No enlarged mediastinal, hilar, or axillary lymph nodes. Thyroid gland, trachea, and esophagus demonstrate no significant findings. Lungs/Pleura: Moderate to large right pleural effusion and associated atelectasis or consolidation, slightly increased compared to prior examination. There are multiple small bilateral pulmonary nodules, which are new or enlarged compared to prior examination, for example 5 mm nodule of the left upper lobe (series 10, image 37). Upper Abdomen: No acute abnormality. Musculoskeletal: Status post right mastectomy. There is a redemonstrated destructive soft tissue lesion of  the anterior right chest wall and pectoral musculature, which is difficult to distinguish from adjacent  pleural fluid and mostly appreciated by the destructive effect on the right anterior fourth rib. There has been interval enlargement of adjacent soft tissue nodularity of the chest wall (e.g. Series 5, image 166) Review of the MIP images confirms the above findings. IMPRESSION: 1.  Negative examination for pulmonary embolism. 2. Moderate to large right pleural effusion and associated atelectasis or consolidation, slightly increased compared to prior examination. 3. There are multiple small bilateral pulmonary nodules, which are new or enlarged compared to prior examination, for example 5 mm nodule of the left upper lobe (series 10, image 37). Findings are most consistent with pulmonary metastatic disease, although atypical infection is not excluded given very rapid interval development in comparison to CT dated 04/11/2019. 4. Status post right mastectomy. There is a redemonstrated destructive soft tissue lesion of the anterior right chest wall and pectoral musculature, which is difficult to distinguish from adjacent pleural fluid and mostly appreciated by the destructive effect on the right anterior fourth rib. There has been interval enlargement of adjacent soft tissue nodularity of the chest wall (e.g. Series 5, image 166) Electronically Signed   By: Eddie Candle M.D.   On: 04/23/2019 15:22   Ct Biopsy  Result Date: 04/24/2019 CLINICAL DATA:  History of breast carcinoma. Enlarging anterior right chest wall mass. EXAM: CT GUIDED CORE BIOPSY OF RIGHT ANTERIOR CHEST WALL MASS ANESTHESIA/SEDATION: Intravenous Fentanyl 22mg and Versed 16mwere administered as conscious sedation during continuous monitoring of the patient's level of consciousness and physiological / cardiorespiratory status by the radiology RN, with a total moderate sedation time of 10 minutes. PROCEDURE: The procedure risks, benefits, and alternatives were explained to the patient. Questions regarding the procedure were encouraged and answered.  The patient understands and consents to the procedure. Select axial scans through the lower chest were obtained. The right anterior chest wall mass was localized and an appropriate skin entry site was determined and marked. The operative field was prepped with chlorhexidinein a sterile fashion, and a sterile drape was applied covering the operative field. A sterile gown and sterile gloves were used for the procedure. Local anesthesia was provided with 1% Lidocaine. Under CT fluoroscopic guidance, a 17 gauge trocar needle was advanced to the margin of the lesion. Once needle tip position was confirmed, coaxial 18-gauge core biopsy samples were obtained, submitted in formalin to surgical pathology. The guide needle was removed. Postprocedure scans show no hemorrhage or other apparent complication. The patient tolerated the procedure well. COMPLICATIONS: None immediate FINDINGS: Right anterior chest wall mass was localized. Representative core biopsy samples obtained as above. IMPRESSION: 1. Technically successful CT-guided core biopsy, right anterior chest wall mass. Electronically Signed   By: D Lucrezia Europe.D.   On: 04/24/2019 14:27   Dg Chest Port 1 View  Result Date: 05/10/2019 CLINICAL DATA:  Shortness of breath. EXAM: PORTABLE CHEST 1 VIEW COMPARISON:  Radiograph 05/05/2019, additional priors. FINDINGS: Accessed right chest port remains in place. Right PleurX catheter tip at the apex. Large pleural effusion has decreased from prior exam with some improving aeration of the right lung apex. Similar leftward mediastinal shift to before. Left lung is grossly clear. No pneumothorax or other change from prior exam. IMPRESSION: Right PleurX catheter in place with persistent but diminishing large right pleural effusion. Improving right apical aeration. Otherwise unchanged exam. Electronically Signed   By: MeKeith Rake.D.   On: 05/10/2019 22:22   Dg  Chest Port 1 View  Result Date: 05/05/2019 CLINICAL DATA:   Sepsis. EXAM: PORTABLE CHEST 1 VIEW COMPARISON:  Radiographs of May 02, 2019. FINDINGS: Stable cardiomediastinal silhouette. Right-sided chest tube is unchanged in position. Right internal jugular Port-A-Cath is unchanged. Large right pleural effusion is noted which is significantly increased in size compared to prior exam. Underlying atelectasis or infiltrate cannot be excluded. Left lung is clear. Bony thorax is unremarkable. No pneumothorax is noted. IMPRESSION: Large right pleural effusion is noted which is enlarged compared to prior exam. Stable position of right-sided chest tube. Electronically Signed   By: Marijo Conception M.D.   On: 05/05/2019 09:17   Dg Chest Port 1 View  Result Date: 04/25/2019 CLINICAL DATA:  Fever EXAM: PORTABLE CHEST 1 VIEW COMPARISON:  April 23, 2019 FINDINGS: The mediastinal contour and cardiac silhouette are stable. Right central venous line is unchanged. There is a large right pleural effusion with consolidation of the right mid and lower lung worsened compared prior exam. The left lung is clear. The bony structures are stable. IMPRESSION: Large right pleural effusion with consolidation of the right mid and lower lung worse compared to prior exam. Electronically Signed   By: Abelardo Diesel M.D.   On: 04/25/2019 19:49   Ir Perc Pleural Drain W/indwell Cath W/img Guide  Result Date: 05/01/2019 CLINICAL DATA:  History of metastatic breast cancer, now with recurrent symptomatic malignant right-sided pleural effusion. Request now made for placement of a right-sided tunneled pleural catheter for palliative purposes. EXAM: INSERTION OF TUNNELED RIGHT SIDED PLEURAL DRAINAGE CATHETER COMPARISON:  Ultrasound-guided thoracentesis-04/24/2019; 04/12/2019; chest CTA-04/23/2019 MEDICATIONS: Ancef 2 gm IV; Antibiotic was administered in an appropriate time interval for the procedure. ANESTHESIA/SEDATION: Moderate (conscious) sedation was employed during this procedure. A total of Versed 4 mg  and Fentanyl 200 mcg was administered intravenously. Moderate Sedation Time: 16 minutes. The patient's level of consciousness and vital signs were monitored continuously by radiology nursing throughout the procedure under my direct supervision. FLUOROSCOPY TIME:  FLUOROSCOPY TIME 12 seconds (3 mGy) COMPLICATIONS: None immediate. PROCEDURE: The procedure, risks, benefits, and alternatives were explained to the patient, who wish to proceed with the placement of this permanent pleural catheter as the patient is seeking palliative care. The patient understand and consent to the procedure. The right inferior lateral chest and upper abdomen were prepped with Chlorhexidine in a sterile fashion, and a sterile drape was applied covering the operative field. Attention was made to avoid the patient's known right anterior chest wall mass. A sterile gown and sterile gloves were used for the procedure. Initial ultrasound scanning and fluoroscopic imaging demonstrates a recurrent moderate to large pleural effusion. Under direct ultrasound guidance, the right inferior lateral pleural space was accessed with a Yueh sheath needle after the overlying soft tissues were anesthetized with 1% lidocaine with epinephrine. An Amplatz super stiff wire was then advanced under fluoroscopy into the pleural space. A 15.5 French tunneled Pleur-X catheter was tunneled from an incision within the right upper abdominal quadrant to the access site. The pleural access site was serially dilated under fluoroscopy, ultimately allowing placement of a peel-away sheath. The catheter was advanced through the peel-away sheath. The sheath was then removed. Final catheter positioning was confirmed with a fluoroscopic radiographic image. The access incision was closed with subcutaneous subcuticular 4-0 Vicryl, Dermabond and Steri-Strips. A Prolene retention suture was applied at the catheter exit site. Large volume thoracentesis was performed through the new  catheter utilizing provided bulb vacuum assisted drainage bag. The  patient tolerated the above procedure well without immediate postprocedural complication. FINDINGS: Preprocedural ultrasound scanning demonstrates a recurrent large sized anechoic right sided pleural effusion. After ultrasound and fluoroscopic guided placement, the catheter is directed right lung apex Following catheter placement, approximately 1.3 L of bloody pleural fluid was removed. IMPRESSION: Successful placement of permanent, tunneled right pleural drainage catheter via lateral approach. 1.3 liters of bloody pleural fluid was removed after catheter placement. Electronically Signed   By: Sandi Mariscal M.D.   On: 05/01/2019 15:42   US Thoracentesis Asp Pleural Space W/img Guide  Result Date: 04/24/2019 INDICATION: Patient with history of right breast cancer, dyspnea, and recurrent right pleural effusion. Request is made for diagnostic and therapeutic right thoracentesis. EXAM: ULTRASOUND GUIDED DIAGNOSTIC AND THERAPEUTIC RIGHT THORACENTESIS MEDICATIONS: 10 mL 1% lidocaine 1 mg morphine IV COMPLICATIONS: None immediate. PROCEDURE: An ultrasound guided thoracentesis was thoroughly discussed with the patient and questions answered. The benefits, risks, alternatives and complications were also discussed. The patient understands and wishes to proceed with the procedure. Written consent was obtained. Ultrasound was performed to localize and mark an adequate pocket of fluid in the right chest. The area was then prepped and draped in the normal sterile fashion. 1% Lidocaine was used for local anesthesia. Under ultrasound guidance a 6 Fr Safe-T-Centesis catheter was introduced. Thoracentesis was performed. The catheter was removed and a dressing applied. FINDINGS: A total of approximately 650 mL of dark red fluid was removed. Samples were sent to the laboratory as requested by the clinical team. IMPRESSION: Successful ultrasound guided right  thoracentesis yielding 650 mL of pleural fluid. Read by: Earley Abide, PA-C No pneumothorax on follow-up CT. Electronically Signed   By: Lucrezia Europe M.D.   On: 04/24/2019 12:52     ELIGIBLE FOR AVAILABLE RESEARCH PROTOCOL: Declined   ASSESSMENT: 48 y.o. [place] Parkins, [place] woman status post right breast lower outer quadrant biopsy 10/29/2018 for a clinical T2 N0, stage IIB invasive ductal carcinoma, grade 3, triple negative, with an MIB-1 of 80%  (a) CT of the chest 11/15/2018 shows possible liver metastases, but liver biopsy 11/25/2018 consistent with hemangioma  (b) bone scan 11/15/2018 shows no suspicious bone lesions  (1) neoadjuvant chemotherapy consisting of doxorubicin and cyclophosphamide in dose dense fashion x4, started 11/26/2018, completed 01/07/2019, followed by weekly paclitaxel and carboplatin x12 starting 01/28/2019  (2) patient desires ovarian function preservation:   (a) Zoladex started 11/08/2018, discontinued 02/04/2019  (b) patient is status post bilateral tubal ligation  (3)status post right mastectomy with sentinel lymph node sampling 03/04/2019 for aypTac ypN1ainvasive ductal carcinoma grade 3, repeat prognostic panel again triple negative (a)a total of 6 axillary lymph nodes removed, 2 sentinel ones, 1positive  (4) adjuvant radiationto be considered  (5) genetics counseling on 11/28/2018, testing was recommended, but declined by patient.  METASTATIC DISEASE: July 2020 (6)Staging studies: (a) chest CT scan and bone scan 11/15/2018 show a liver mass, no lung or bone lesions (b) liver biopsy 11/25/2018 shows a hemangioma (c)chest CT Angio 03/26/2019 again shows liver mass, right breast "seroma"             (d) cytology from R thoracentesis 04/12/2019 nondiagnostic             (e) CT angio 04/23/2019 shows recurrent right effusion, multiple bilateral pulmonary nodules, invasive anterior right chest wall  lesion  (f) biopsy of the chest wall lesion on 04/24/2019 and cytology from the right pleural effusion the same day confirms metastatic breast cancer  (g) CARIS study results pending  (  7) pembrolizumabstarted 04/02/2019, to be repeated every 21 days  (8) carboplatin and gemcitabine started 05/13/2019, to be repeated days 1 and 8 of each 21-day cycle  (9) pain control:  (a) currently on MS Contin 37m twice daily with MSIR 15 mg as needed  (b) so far no need for bowel prophylaxis  (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  Her ANC is only 1.4 and I am going to try to add on pro today.  If insurance does not approve it we will see what they will approve so that we do not have to delay her treatments.  She will come tomorrow for fluids and then I will see her again on 05/29/2019 to recheck her nadir counts and to make sure she continues to do well.  She is lethargic and was falling asleep during our visit.  I think the MS Contin may be a little high at 30 and we are dropping it to 15.  [facility]  Right now the bowel prophylaxis regimen appears to be working well.  She is doing a good job with her family draining the right effusion and that is helping her breathing.  She would like to participate in physical therapy, which she was receiving for full range of motion of her right upper extremity.  She is very weak right now but perhaps in a couple of weeks or 3 she might be able to participate and I will go ahead and place a prescription for her  Otherwise that she and her family know to call for any other issue that may develop before the next visit.  GChauncey Cruel MD Medical Oncology and Hematology [facility] Brook StreetAGreenwood [place] [address]Tel. [phone]   Fax. [phone]  I,  KWilburn Mylar am acting as scribe for Dr. GVirgie Dad Makayah Pauli.  I, GLurline DelMD, have reviewed the above documentation for accuracy and completeness, and I agree with the above.

## 2019-05-19 ENCOUNTER — Telehealth: Payer: Self-pay | Admitting: *Deleted

## 2019-05-19 ENCOUNTER — Inpatient Hospital Stay: Payer: BC Managed Care – PPO

## 2019-05-19 ENCOUNTER — Other Ambulatory Visit: Payer: Self-pay

## 2019-05-19 ENCOUNTER — Inpatient Hospital Stay (HOSPITAL_BASED_OUTPATIENT_CLINIC_OR_DEPARTMENT_OTHER): Payer: BC Managed Care – PPO | Admitting: Oncology

## 2019-05-19 VITALS — HR 106

## 2019-05-19 VITALS — BP 109/77 | HR 106 | Temp 97.5°F | Resp 18 | Ht 62.0 in | Wt 149.6 lb

## 2019-05-19 DIAGNOSIS — Z171 Estrogen receptor negative status [ER-]: Secondary | ICD-10-CM | POA: Diagnosis not present

## 2019-05-19 DIAGNOSIS — C50511 Malignant neoplasm of lower-outer quadrant of right female breast: Secondary | ICD-10-CM

## 2019-05-19 DIAGNOSIS — C7800 Secondary malignant neoplasm of unspecified lung: Secondary | ICD-10-CM

## 2019-05-19 DIAGNOSIS — Z5112 Encounter for antineoplastic immunotherapy: Secondary | ICD-10-CM | POA: Diagnosis not present

## 2019-05-19 LAB — COMPREHENSIVE METABOLIC PANEL
ALT: 96 U/L — ABNORMAL HIGH (ref 0–44)
AST: 30 U/L (ref 15–41)
Albumin: 2.6 g/dL — ABNORMAL LOW (ref 3.5–5.0)
Alkaline Phosphatase: 167 U/L — ABNORMAL HIGH (ref 38–126)
Anion gap: 16 — ABNORMAL HIGH (ref 5–15)
BUN: 23 mg/dL — ABNORMAL HIGH (ref 6–20)
CO2: 23 mmol/L (ref 22–32)
Calcium: 10.4 mg/dL — ABNORMAL HIGH (ref 8.9–10.3)
Chloride: 97 mmol/L — ABNORMAL LOW (ref 98–111)
Creatinine, Ser: 1.17 mg/dL — ABNORMAL HIGH (ref 0.44–1.00)
GFR calc Af Amer: >60 mL/min (ref >60)
GFR calc non Af Amer: 55 mL/min — ABNORMAL LOW (ref >60)
Glucose, Bld: 108 mg/dL — ABNORMAL HIGH (ref 70–99)
Potassium: 4 mmol/L (ref 3.5–5.1)
Sodium: 136 mmol/L (ref 135–145)
Total Bilirubin: 0.4 mg/dL (ref 0.3–1.2)
Total Protein: 7.4 g/dL (ref 6.5–8.1)

## 2019-05-19 LAB — CBC WITH DIFFERENTIAL/PLATELET
Abs Immature Granulocytes: 0.01 10*3/uL (ref 0.00–0.07)
Basophils Absolute: 0 10*3/uL (ref 0.0–0.1)
Basophils Relative: 1 %
Eosinophils Absolute: 0.1 10*3/uL (ref 0.0–0.5)
Eosinophils Relative: 2 %
HCT: 41.9 % (ref 36.0–46.0)
Hemoglobin: 12.4 g/dL (ref 12.0–15.0)
Immature Granulocytes: 0 %
Lymphocytes Relative: 43 %
Lymphs Abs: 1.3 10*3/uL (ref 0.7–4.0)
MCH: 23.5 pg — ABNORMAL LOW (ref 26.0–34.0)
MCHC: 29.6 g/dL — ABNORMAL LOW (ref 30.0–36.0)
MCV: 79.5 fL — ABNORMAL LOW (ref 80.0–100.0)
Monocytes Absolute: 0.1 10*3/uL (ref 0.1–1.0)
Monocytes Relative: 3 %
Neutro Abs: 1.5 10*3/uL — ABNORMAL LOW (ref 1.7–7.7)
Neutrophils Relative %: 51 %
Platelets: 289 10*3/uL (ref 150–400)
RBC: 5.27 MIL/uL — ABNORMAL HIGH (ref 3.87–5.11)
RDW: 20.2 % — ABNORMAL HIGH (ref 11.5–15.5)
WBC: 2.9 10*3/uL — ABNORMAL LOW (ref 4.0–10.5)
nRBC: 0 % (ref 0.0–0.2)

## 2019-05-19 MED ORDER — DEXAMETHASONE SODIUM PHOSPHATE 10 MG/ML IJ SOLN
INTRAMUSCULAR | Status: AC
  Filled 2019-05-19: qty 1

## 2019-05-19 MED ORDER — PALONOSETRON HCL INJECTION 0.25 MG/5ML
0.2500 mg | Freq: Once | INTRAVENOUS | Status: AC
  Administered 2019-05-19: 0.25 mg via INTRAVENOUS

## 2019-05-19 MED ORDER — SODIUM CHLORIDE 0.9 % IV SOLN
Freq: Once | INTRAVENOUS | Status: AC
  Administered 2019-05-19: 15:00:00 via INTRAVENOUS
  Filled 2019-05-19: qty 250

## 2019-05-19 MED ORDER — MORPHINE SULFATE ER 15 MG PO TBCR: 15.0000 mg | EXTENDED_RELEASE_TABLET | Freq: Two times a day (BID) | ORAL | 0 refills | Status: AC

## 2019-05-19 MED ORDER — PALONOSETRON HCL INJECTION 0.25 MG/5ML
INTRAVENOUS | Status: AC
  Filled 2019-05-19: qty 5

## 2019-05-19 MED ORDER — SODIUM CHLORIDE 0.9% FLUSH
10.0000 mL | INTRAVENOUS | Status: DC | PRN
  Administered 2019-05-19: 10 mL
  Filled 2019-05-19: qty 10

## 2019-05-19 MED ORDER — PEGFILGRASTIM 6 MG/0.6ML ~~LOC~~ PSKT
6.0000 mg | PREFILLED_SYRINGE | Freq: Once | SUBCUTANEOUS | Status: AC
  Administered 2019-05-19: 6 mg via SUBCUTANEOUS

## 2019-05-19 MED ORDER — DEXAMETHASONE SODIUM PHOSPHATE 10 MG/ML IJ SOLN
10.0000 mg | Freq: Once | INTRAMUSCULAR | Status: AC
  Administered 2019-05-19: 10 mg via INTRAVENOUS

## 2019-05-19 MED ORDER — HEPARIN SOD (PORK) LOCK FLUSH 100 UNIT/ML IV SOLN
500.0000 [IU] | Freq: Once | INTRAVENOUS | Status: AC | PRN
  Administered 2019-05-19: 500 [IU]
  Filled 2019-05-19: qty 5

## 2019-05-19 MED ORDER — SODIUM CHLORIDE 0.9 % IV SOLN
1000.0000 mg/m2 | Freq: Once | INTRAVENOUS | Status: AC
  Administered 2019-05-19: 1862 mg via INTRAVENOUS
  Filled 2019-05-19: qty 48.97

## 2019-05-19 MED ORDER — MORPHINE SULFATE 15 MG PO TABS: 15.0000 mg | ORAL_TABLET | ORAL | 0 refills | Status: AC | PRN

## 2019-05-19 MED ORDER — PEGFILGRASTIM 6 MG/0.6ML ~~LOC~~ PSKT
PREFILLED_SYRINGE | SUBCUTANEOUS | Status: AC
  Filled 2019-05-19: qty 0.6

## 2019-05-19 MED ORDER — SODIUM CHLORIDE 0.9 % IV SOLN
194.2000 mg | Freq: Once | INTRAVENOUS | Status: AC
  Administered 2019-05-19: 190 mg via INTRAVENOUS
  Filled 2019-05-19: qty 19

## 2019-05-19 NOTE — Telephone Encounter (Signed)
MD aware of heart rate of 106 and ok to proceed with treatment

## 2019-05-19 NOTE — Patient Instructions (Addendum)
Take the onpro off your arm any time after 9:00pm Tuesday, 05/20/2019.   [facility] Discharge Instructions for Patients Receiving Chemotherapy  Today you received the following chemotherapy agents: gemcitabine (Gemzar), carboplatin (Paraplatin)  To help prevent nausea and vomiting after your treatment, we encourage you to take your nausea medication as directed by your physician.    If you develop nausea and vomiting that is not controlled by your nausea medication, call the clinic.   BELOW ARE SYMPTOMS THAT SHOULD BE REPORTED IMMEDIATELY:  *FEVER GREATER THAN 100.5 F  *CHILLS WITH OR WITHOUT FEVER  NAUSEA AND VOMITING THAT IS NOT CONTROLLED WITH YOUR NAUSEA MEDICATION  *UNUSUAL SHORTNESS OF BREATH  *UNUSUAL BRUISING OR BLEEDING  TENDERNESS IN MOUTH AND THROAT WITH OR WITHOUT PRESENCE OF ULCERS  *URINARY PROBLEMS  *BOWEL PROBLEMS  UNUSUAL RASH Items with * indicate a potential emergency and should be followed up as soon as possible.  Feel free to call the clinic should you have any questions or concerns. The clinic phone number is [phone].  Please show the [place] at check-in to the Emergency Department and triage nurse.

## 2019-05-19 NOTE — Progress Notes (Signed)
Neulasta Onpro added to D8 of chemo treatments from this cycle onward per MD. Karena Addison alerted and said no auth was needed for this treatment addition.   Demetrius Charity, PharmD, [facility] Pharmacist Pharmacy Phone: [phone] 05/19/2019

## 2019-05-20 ENCOUNTER — Inpatient Hospital Stay: Payer: BC Managed Care – PPO

## 2019-05-20 ENCOUNTER — Other Ambulatory Visit: Payer: Self-pay

## 2019-05-20 ENCOUNTER — Telehealth: Payer: Self-pay | Admitting: Oncology

## 2019-05-20 ENCOUNTER — Telehealth: Payer: Self-pay | Admitting: *Deleted

## 2019-05-20 VITALS — BP 122/34 | HR 102 | Temp 97.9°F | Resp 24

## 2019-05-20 DIAGNOSIS — E860 Dehydration: Secondary | ICD-10-CM

## 2019-05-20 DIAGNOSIS — Z95828 Presence of other vascular implants and grafts: Secondary | ICD-10-CM

## 2019-05-20 DIAGNOSIS — Z5112 Encounter for antineoplastic immunotherapy: Secondary | ICD-10-CM | POA: Diagnosis not present

## 2019-05-20 DIAGNOSIS — C50511 Malignant neoplasm of lower-outer quadrant of right female breast: Secondary | ICD-10-CM

## 2019-05-20 DIAGNOSIS — Z171 Estrogen receptor negative status [ER-]: Secondary | ICD-10-CM

## 2019-05-20 LAB — CULTURE, BODY FLUID W GRAM STAIN -BOTTLE: Culture: NO GROWTH

## 2019-05-20 MED ORDER — SODIUM CHLORIDE 0.9% FLUSH
10.0000 mL | Freq: Once | INTRAVENOUS | Status: AC
  Administered 2019-05-20: 10 mL via INTRAVENOUS
  Filled 2019-05-20: qty 10

## 2019-05-20 MED ORDER — HEPARIN SOD (PORK) LOCK FLUSH 100 UNIT/ML IV SOLN
500.0000 [IU] | Freq: Once | INTRAVENOUS | Status: AC
  Administered 2019-05-20: 500 [IU] via INTRAVENOUS
  Filled 2019-05-20: qty 5

## 2019-05-20 MED ORDER — SODIUM CHLORIDE 0.9 % IV SOLN
INTRAVENOUS | Status: DC
  Administered 2019-05-20: 15:00:00 via INTRAVENOUS
  Filled 2019-05-20 (×2): qty 250

## 2019-05-20 NOTE — Patient Instructions (Signed)
Rehydration, Adult Rehydration is the replacement of body fluids and salts and minerals (electrolytes) that are lost during dehydration. Dehydration is when there is not enough fluid or water in the body. This happens when you lose more fluids than you take in. Common causes of dehydration include:  Vomiting.  Diarrhea.  Excessive sweating, such as from heat exposure or exercise.  Taking medicines that cause the body to lose excess fluid (diuretics).  Impaired kidney function.  Not drinking enough fluid.  Certain illnesses or infections.  Certain poorly controlled long-term (chronic) illnesses, such as diabetes, heart disease, and kidney disease.  Symptoms of mild dehydration may include thirst, dry lips and mouth, dry skin, and dizziness. Symptoms of severe dehydration may include increased heart rate, confusion, fainting, and not urinating. You can rehydrate by drinking certain fluids or getting fluids through an IV tube, as told by your health care provider. What are the risks? Generally, rehydration is safe. However, one problem that can happen is taking in too much fluid (overhydration). This is rare. If overhydration happens, it can cause an electrolyte imbalance, kidney failure, or a decrease in salt (sodium) levels in the body. How to rehydrate Follow instructions from your health care provider for rehydration. The kind of fluid you should drink and the amount you should drink depend on your condition.  If directed by your health care provider, drink an oral rehydration solution (ORS). This is a drink designed to treat dehydration that is found in pharmacies and retail stores. ? Make an ORS by following instructions on the package. ? Start by drinking small amounts, about  cup (120 mL) every 5-10 minutes. ? Slowly increase how much you drink until you have taken the amount recommended by your health care provider.  Drink enough clear fluids to keep your urine clear or pale  yellow. If you were instructed to drink an ORS, finish the ORS first, then start slowly drinking other clear fluids. Drink fluids such as: ? Water. Do not drink only water. Doing that can lead to having too little sodium in your body (hyponatremia). ? Ice chips. ? Fruit juice that you have added water to (diluted juice). ? Low-calorie sports drinks.  If you are severely dehydrated, your health care provider may recommend that you receive fluids through an IV tube in the hospital.  Do not take sodium tablets. Doing that can lead to the condition of having too much sodium in your body (hypernatremia). Eating while you rehydrate Follow instructions from your health care provider about what to eat while you rehydrate. Your health care provider may recommend that you slowly begin eating regular foods in small amounts.  Eat foods that contain a healthy balance of electrolytes, such as bananas, oranges, potatoes, tomatoes, and spinach.  Avoid foods that are greasy or contain a lot of fat or sugar.  In some cases, you may get nutrition through a feeding tube that is passed through your nose and into your stomach (nasogastric tube, or NG tube). This may be done if you have uncontrolled vomiting or diarrhea. Beverages to avoid Certain beverages may make dehydration worse. While you rehydrate, avoid:  Alcohol.  Caffeine.  Drinks that contain a lot of sugar. These include: ? High-calorie sports drinks. ? Fruit juice that is not diluted. ? Soda.  Check nutrition labels to see how much sugar or caffeine a beverage contains. Signs of dehydration recovery You may be recovering from dehydration if:  You are urinating more often than before you started   rehydrating.  Your urine is clear or pale yellow.  Your energy level improves.  You vomit less frequently.  You have diarrhea less frequently.  Your appetite improves or returns to normal.  You feel less dizzy or less light-headed.  Your  skin tone and color start to look more normal. Contact a health care provider if:  You continue to have symptoms of mild dehydration, such as: ? Thirst. ? Dry lips. ? Slightly dry mouth. ? Dry, warm skin. ? Dizziness.  You continue to vomit or have diarrhea. Get help right away if:  You have symptoms of dehydration that get worse.  You feel: ? Confused. ? Weak. ? Like you are going to faint.  You have not urinated in 6-8 hours.  You have very dark urine.  You have trouble breathing.  Your heart rate while sitting still is over 100 beats a minute.  You cannot drink fluids without vomiting.  You have vomiting or diarrhea that: ? Gets worse. ? Does not go away.  You have a fever. This information is not intended to replace advice given to you by your health care provider. Make sure you discuss any questions you have with your health care provider. Document Released: 12/04/2011 Document Revised: 08/24/2017 Document Reviewed: 11/05/2015 Elsevier Patient Education  2020 Elsevier Inc.  Coronavirus (COVID-19) Are you at risk?  Are you at risk for the Coronavirus (COVID-19)?  To be considered HIGH RISK for Coronavirus (COVID-19), you have to meet the following criteria:  . Traveled to [place], [place], [name], [place] or [place]; or in the [place] to [place], [place], [place], or [place]; and have fever, cough, and shortness of breath within the last 2 weeks of travel OR . Been in close contact with a person diagnosed with COVID-19 within the last 2 weeks and have fever, cough, and shortness of breath . IF YOU DO NOT MEET THESE CRITERIA, YOU ARE CONSIDERED LOW RISK FOR COVID-19.  What to do if you are HIGH RISK for COVID-19?  . If you are having a medical emergency, call 911. . Seek medical care right away. Before you go to a doctor's office, urgent care or emergency department, call ahead and tell them about your recent travel, contact with someone diagnosed  with COVID-19, and your symptoms. You should receive instructions from your physician's office regarding next steps of care.  . When you arrive at healthcare provider, tell the healthcare staff immediately you have returned from visiting [place], [place], [place], [place] or [name]; or traveled in the [place] to [place], [place], [place], or [place]; in the last two weeks or you have been in close contact with a person diagnosed with COVID-19 in the last 2 weeks.   . Tell the health care staff about your symptoms: fever, cough and shortness of breath. . After you have been seen by a medical provider, you will be either: o Tested for (COVID-19) and discharged home on quarantine except to seek medical care if symptoms worsen, and asked to  - Stay home and avoid contact with others until you get your results (4-5 days)  - Avoid travel on public transportation if possible (such as bus, train, or airplane) or o Sent to the Emergency Department by EMS for evaluation, COVID-19 testing, and possible admission depending on your condition and test results.  What to do if you are LOW RISK for COVID-19?  Reduce your risk of any infection by using the same precautions   used for avoiding the common cold or flu:  . Wash your hands often with soap and warm water for at least 20 seconds.  If soap and water are not readily available, use an alcohol-based hand sanitizer with at least 60% alcohol.  . If coughing or sneezing, cover your mouth and nose by coughing or sneezing into the elbow areas of your shirt or coat, into a tissue or into your sleeve (not your hands). . Avoid shaking hands with others and consider head nods or verbal greetings only. . Avoid touching your eyes, nose, or mouth with unwashed hands.  . Avoid close contact with people who are sick. . Avoid places or events with large numbers of people in one location, like concerts or sporting events. . Carefully consider travel plans you have  or are making. . If you are planning any travel outside or inside the [place], visit the CDC's Travelers' Health webpage for the latest health notices. . If you have some symptoms but not all symptoms, continue to monitor at home and seek medical attention if your symptoms worsen. . If you are having a medical emergency, call 911.   ADDITIONAL HEALTHCARE OPTIONS FOR PATIENTS  [facility] / e-Visit: [email or web address]         [facility]: [phone]  [facility]: [phone]                   [facility]: [phone]   

## 2019-05-20 NOTE — Telephone Encounter (Signed)
I will ask Dr Jannifer Rodney to address this at his visit with her next week- at least then he could dictate his conversation and then document in [facility].  Today Brittany Rodgers could not make a decision.

## 2019-05-20 NOTE — Telephone Encounter (Signed)
This RN spoke with pt yesterday during her day 8 infusion for update and concerns regarding he currently being estranged from her husband.   Per prior visit - husband has been removed from [place] list per pt's request.  This RN discussed with pt issue relating to if pt become unable to answer or give consent for medical treatment - legally it would defer to her husband.  Brittany Rodgers stated " we got a real problem here, he is unable to make these kind of decisions right now "  This RN explained that could name another family member and we obtain a HCPOA for her best outcome.  Brittany Rodgers was unable at this discussion to state who she feels could do this. Plan is for her to think about situation and who she feels comfortable naming as HCPOA.

## 2019-05-20 NOTE — Telephone Encounter (Signed)
I talk with patient regarding schedule  

## 2019-05-20 NOTE — Telephone Encounter (Signed)
Why could not another patient in that day sign as witness/

## 2019-05-21 ENCOUNTER — Ambulatory Visit: Payer: BC Managed Care – PPO | Attending: Oncology | Admitting: Physical Therapy

## 2019-05-21 DIAGNOSIS — M25611 Stiffness of right shoulder, not elsewhere classified: Secondary | ICD-10-CM | POA: Diagnosis not present

## 2019-05-21 DIAGNOSIS — R600 Localized edema: Secondary | ICD-10-CM | POA: Insufficient documentation

## 2019-05-21 DIAGNOSIS — R293 Abnormal posture: Secondary | ICD-10-CM | POA: Diagnosis present

## 2019-05-21 DIAGNOSIS — M25511 Pain in right shoulder: Secondary | ICD-10-CM | POA: Diagnosis present

## 2019-05-21 DIAGNOSIS — M6281 Muscle weakness (generalized): Secondary | ICD-10-CM | POA: Diagnosis present

## 2019-05-21 NOTE — Therapy (Signed)
Brittany Rodgers, [place], [address] Phone: [phone]   Fax:  [phone]  Physical Therapy Evaluation  Patient Details  Name: Brittany Rodgers MRN: [id number] Date of Birth: [birth year] Referring Provider (PT): Magrinat    Encounter Date: 05/21/2019  PT End of Session - 05/21/19 1945    Visit Number  5    Number of Visits  25    Date for PT Re-Evaluation  07/21/19   pt will miss PT on chemo weeks   PT Start Time  1530    PT Stop Time  1615    PT Time Calculation (min)  45 min    Activity Tolerance  Patient tolerated treatment well    Behavior During Therapy  [facility] for tasks assessed/performed       Past Medical History:  Diagnosis Date  . Cancer ([place])    BREAST CANCER RIGHT BREAST  . Chronic cough   . Family history of breast cancer   . GERD (gastroesophageal reflux disease)   . Hyperlipidemia   . IBS (irritable bowel syndrome)    sees Dr. Silvano Rusk   . Routine gynecological examination    sees Dr. Freda Munro    Past Surgical History:  Procedure Laterality Date  . CESAREAN SECTION    . IR PERC PLEURAL DRAIN W/INDWELL CATH W/IMG GUIDE  05/01/2019  . MASTECTOMY W/ SENTINEL NODE BIOPSY Right 03/04/2019   Procedure: RIGHT MASTECTOMY WITH RIGHT AXILLARY SENTINEL LYMPH NODE BIOPSY;  Surgeon: Rolm Bookbinder, MD;  Location: [place];  Service: General;  Laterality: Right;  . PORTACATH PLACEMENT Right 11/14/2018   Procedure: INSERTION PORT-A-CATH WITH ULTRASOUND;  Surgeon: Rolm Bookbinder, MD;  Location: [place];  Service: General;  Laterality: Right;  . ROOT CANAL      There were no vitals filed for this visit.   Subjective Assessment - 05/21/19 1532    Subjective  "I'm hoping I can pick up where I left off " Pt had to miss PT for a couple of weeks because she had to go to the hospial due to shortness of breath but she is better now PT reordered by Dr.  Jana Hakim    Pertinent History  03/04/19- R mastectomy with R SLNB (6 nodes removed and 1 positive), pt is currently undergoing chemotherapy, pt will begin radiation after chemo    Patient Stated Goals  to be able to lift more than 5 lbs, full shoulder ROM, decrease back pain and under arm pain    Currently in Pain?  No/denies         [facility] PT Assessment - 05/21/19 0001      Assessment   Medical Diagnosis  right breast cancer    Referring Provider (PT)  Magrinat     Onset Date/Surgical Date  03/04/19    Hand Dominance  Right    Prior Therapy  none      Precautions   Precautions  Other (comment)    Precaution Comments  at risk for lymphedema      Restrictions   Weight Bearing Restrictions  No      Home Environment   Living Environment  Private residence    Living Arrangements  Spouse/significant other    Available Help at Discharge  Family    Type of [place]      Prior Function   Level of Independence  Needs assistance with ADLs    Vocation  Full time employment  on medical leave   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, clean up fitting room    Leisure  pt has not been exercising since surgery due to pain      Cognition   Overall Cognitive Status  Within Functional Limits for tasks assessed      Observation/Other Assessments   Observations  pt reports swelling under her R axilla and is currently wearing 2 compression bras, she did not want to remove them today due to pain      AROM   Right Shoulder Flexion  95 Degrees    Right Shoulder ABduction  75 Degrees    Right Shoulder Internal Rotation  40 Degrees    Right Shoulder External Rotation  90 Degrees   did not test secondary to pain   Left Shoulder Flexion  --    Left Shoulder ABduction  --    Left Shoulder Internal Rotation  --    Left Shoulder External Rotation  --      Palpation   Palpation comment  firm "golf ball sized" area at lateral incision that is slightly tender to touch. firm  tissue at anterior and lateral chest with decreased scar mobility         LYMPHEDEMA/ONCOLOGY QUESTIONNAIRE - 05/21/19 1605      Right Upper Extremity Lymphedema   15 cm Proximal to Olecranon Process  31.5 cm    Olecranon Process  25 cm    15 cm Proximal to Ulnar Styloid Process  24.5 cm    Just Proximal to Ulnar Styloid Process  17 cm    Across Hand at [employer]  18.5 cm    At [place] of 2nd Digit  6.7 cm    Other  pt reports significant weight loss since last measurement.  She had gained weight with her first chemo and now her weight is normalizing       Left Upper Extremity Lymphedema   15 cm Proximal to Olecranon Process  32 cm    Olecranon Process  25.5 cm    15 cm Proximal to Ulnar Styloid Process  26 cm    Just Proximal to Ulnar Styloid Process  17 cm    Across Hand at [employer]  18 cm    At [place] of 2nd Digit  6.5 cm             Objective measurements completed on examination: See above findings.         [facility] Adult PT Treatment/Exercise - 05/21/19 0001      Self-Care   Self-Care  Other Self-Care Comments    Other Self-Care Comments   chip pack for inside bra over lateral incision       Exercises   Exercises  Shoulder      Shoulder Exercises: Supine   Other Supine Exercises  supine dowel flexion       Manual Therapy   Passive ROM  gentle ROM of right shoulder into flexion, abduction and diagonals              PT Education - 05/21/19 1944    Education Details  supine dowel exercise    Person(s) Educated  Patient    Methods  Explanation;Handout          PT Long Term Goals - 05/21/19 1952      PT LONG TERM GOAL #1   Title  Pt will demonstrate 140 degrees of right shoulder flexion to  allow her to reach items overhead    Baseline  89    Time  8    Period  Weeks    Status  On-going      PT LONG TERM GOAL #2   Title  Pt will demonstate 130 degrees of right shoulder abduction to allow her to reach out to the side    Baseline   56    Time  8    Period  Weeks    Status  On-going      PT LONG TERM GOAL #3   Title  Pt will report a 50% decrease in pain around medial scapula and right lateral trunk to allow improved comfort    Time  8    Period  Weeks    Status  On-going      PT LONG TERM GOAL #4   Title  Pt will be independent in a home exercise program for continued strengthening and stretching    Baseline  tightness Rt posterior shoulder but ROM is full     Time  8    Period  Weeks    Status  On-going      PT LONG TERM GOAL #5   Title  Pt will report a 50% decrease in swelling in right lateral trunk to allow improved comfort    Time  8    Period  Weeks    Status  On-going             Plan - 05/21/19 1946    Clinical Impression Statement  Pt comes back to PT to continue to work on regaining shoulder ROM and strength.  She continues to received chemotherapy and has some medical issues, but is hopeful with this new chemo she will be better.  She has lost the weight she gained during last chemo and both arm circumferences are reduced with no signs of arm lymphedema .  She does have soft tissue congestion and tightness in her chest with a firm collection of fluid at lateral border of chest incision.  she was given a chip pack to wear insider her comprression bra ( which now fits loosly) She was give supine dowel flexion exercise, Her sister Meredith Mody is present and involved in care    Personal Factors and Comorbidities  Time since onset of injury/illness/exacerbation    Examination-Participation Restrictions  Cleaning;Meal Prep;Laundry    Rehab Potential  Good    PT Frequency  2x / week    PT Duration  8 weeks    PT Treatment/Interventions  ADLs/Self Care Home Management;Therapeutic activities;Therapeutic exercise;Manual lymph drainage;Manual techniques;Scar mobilization;Passive range of motion;Taping;Joint Manipulations    PT Next Visit Plan  Cont STM to tight muscles in right upper quadrant. Continue gentle  PROM to Rt shoulder, continue MLD Rt lateral trunk, and cont pulleys.progress gently.  Pt will not come to PT on chemo weeks    PT Home Exercise Plan  post op breast, supine dowel flexion    Consulted and Agree with Plan of Care  Patient       Patient will benefit from skilled therapeutic intervention in order to improve the following deficits and impairments:  Increased muscle spasms, Decreased range of motion, Decreased scar mobility, Decreased knowledge of precautions, Increased edema, Decreased strength, Increased fascial restricitons, Postural dysfunction, Pain, Impaired UE functional use  Visit Diagnosis: Stiffness of right shoulder, not elsewhere classified - Plan: PT plan of care cert/re-cert  Acute pain of right shoulder - Plan: PT plan  of care cert/re-cert  Localized edema - Plan: PT plan of care cert/re-cert  Muscle weakness (generalized) - Plan: PT plan of care cert/re-cert  Abnormal posture - Plan: PT plan of care cert/re-cert     Problem List Patient Active Problem List   Diagnosis Date Noted  . Lung metastases ([place]) 05/13/2019  . Fever   . Goals of care, counseling/discussion 04/25/2019  . 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 Pleural effusion 04/11/2019  . S/P mastectomy 03/04/2019  . Anemia due to antineoplastic chemotherapy 02/04/2019  . Port-A-Cath in place 01/07/2019  . Family history of breast cancer   . Malignant neoplasm of lower-outer quadrant of right breast of female, estrogen receptor negative ([place]) 11/04/2018  . Thoracic spine pain 05/03/2018  . Hypokalemia 02/13/2017  . Chronic right hip pain 11/26/2015  . Hyperlipidemia 12/23/2010  . ALLERGIC RHINITIS CAUSE UNSPECIFIED 07/24/2008  . GERD 10/14/2007  . IBS 10/14/2007   Donato Heinz. Owens Shark PT  Norwood Levo 05/21/2019, 7:57 PM  [place], [place], [address] Phone: [phone]   Fax:  [phone]  Name:  Azaline Mouch MRN: [id number] Date of Birth: [birth year]

## 2019-05-22 ENCOUNTER — Telehealth: Payer: Self-pay | Admitting: Oncology

## 2019-05-22 NOTE — Telephone Encounter (Signed)
Called patient regarding infusion log, informed patient about rescheduling 09/09 infusion appointment time due to holiday.

## 2019-05-23 ENCOUNTER — Telehealth: Payer: Self-pay | Admitting: Oncology

## 2019-05-23 NOTE — Telephone Encounter (Signed)
Returned patient's phone call, patient is aware of 09/09 appointment time.

## 2019-05-27 ENCOUNTER — Other Ambulatory Visit: Payer: Self-pay

## 2019-05-27 ENCOUNTER — Ambulatory Visit: Payer: BC Managed Care – PPO | Attending: Oncology | Admitting: Physical Therapy

## 2019-05-27 DIAGNOSIS — M25611 Stiffness of right shoulder, not elsewhere classified: Secondary | ICD-10-CM | POA: Diagnosis not present

## 2019-05-27 DIAGNOSIS — R293 Abnormal posture: Secondary | ICD-10-CM | POA: Insufficient documentation

## 2019-05-27 DIAGNOSIS — M6281 Muscle weakness (generalized): Secondary | ICD-10-CM | POA: Diagnosis present

## 2019-05-27 DIAGNOSIS — M25511 Pain in right shoulder: Secondary | ICD-10-CM | POA: Insufficient documentation

## 2019-05-27 DIAGNOSIS — R600 Localized edema: Secondary | ICD-10-CM | POA: Diagnosis present

## 2019-05-27 NOTE — Therapy (Addendum)
[place], [place], [address] Phone: [phone]   Fax:  [phone]  Physical Therapy Treatment  Patient Details  Name: Brittany Rodgers MRN: [id number] Date of Birth: January 15, 1971 Referring Provider (PT): Magrinat    Encounter Date: 05/27/2019  PT End of Session - 05/27/19 1444    Visit Number  6    Number of Visits  25    Date for PT Re-Evaluation  07/21/19    PT Start Time  1340    PT Stop Time  1425    PT Time Calculation (min)  45 min       Past Medical History:  Diagnosis Date  . Cancer ([place])    BREAST CANCER RIGHT BREAST  . Chronic cough   . Family history of breast cancer   . GERD (gastroesophageal reflux disease)   . Hyperlipidemia   . IBS (irritable bowel syndrome)    sees Dr. Silvano Rusk   . Routine gynecological examination    sees Dr. Freda Munro    Past Surgical History:  Procedure Laterality Date  . CESAREAN SECTION    . IR PERC PLEURAL DRAIN W/INDWELL CATH W/IMG GUIDE  05/01/2019  . MASTECTOMY W/ SENTINEL NODE BIOPSY Right 03/04/2019   Procedure: RIGHT MASTECTOMY WITH RIGHT AXILLARY SENTINEL LYMPH NODE BIOPSY;  Surgeon: Rolm Bookbinder, MD;  Location: [facility];  Service: General;  Laterality: Right;  . PORTACATH PLACEMENT Right 11/14/2018   Procedure: INSERTION PORT-A-CATH WITH ULTRASOUND;  Surgeon: Rolm Bookbinder, MD;  Location: [facility];  Service: General;  Laterality: Right;  . ROOT CANAL      There were no vitals filed for this visit.  Subjective Assessment - 05/27/19 1342    Subjective  "I'm tired"  Pt states she took a bath today ( she bathes herself and then headed out the door.  She didn't realize now tired it made her.  Her O2 sats are 91% and resting HR is 117    Pertinent History  03/04/19- R mastectomy with R SLNB (6 nodes removed and 1 positive), pt is currently undergoing chemotherapy, pt will begin radiation after chemo     Patient Stated Goals  to be able to lift more than 5 lbs, full shoulder ROM, decrease back pain and under arm pain    Currently in Pain?  No/denies                       [facility] Adult PT Treatment/Exercise - 05/27/19 0001      Manual Therapy   Soft tissue mobilization  with thick massage cream in left sidelying for right lateral chest, posterior shoulder and neck,, and tight anterior chest.     Passive ROM  gentle ROM of right shoulder into flexion, abduction and diagonals  pt with increased pain with this and so could not tolerate much today                   PT Long Term Goals - 05/21/19 1952      PT LONG TERM GOAL #1   Title  Pt will demonstrate 140 degrees of right shoulder flexion to allow her to reach items overhead    Baseline  89    Time  8    Period  Weeks    Status  On-going      PT LONG TERM GOAL #2   Title  Pt will demonstate 130 degrees of right shoulder  abduction to allow her to reach out to the side    Baseline  56    Time  8    Period  Weeks    Status  On-going      PT LONG TERM GOAL #3   Title  Pt will report a 50% decrease in pain around medial scapula and right lateral trunk to allow improved comfort    Time  8    Period  Weeks    Status  On-going      PT LONG TERM GOAL #4   Title  Pt will be independent in a home exercise program for continued strengthening and stretching    Baseline  tightness Rt posterior shoulder but ROM is full     Time  8    Period  Weeks    Status  On-going      PT LONG TERM GOAL #5   Title  Pt will report a 50% decrease in swelling in right lateral trunk to allow improved comfort    Time  8    Period  Weeks    Status  On-going            Plan - 05/27/19 1439    Clinical Impression Statement  Pt says she got relaxed with treament and breathing seemed to ease during session.  O2 sats increased to 94%, but HR stayed at 118.  inbasket sent to [place] reports increased apin in right arm  to touch and self limited PROM.  Her muscle tightess in posterior shoulder and lateral trunk eased with soft tissue work and, to a lesser extent, so did the tender trigger points in her right upper trap. she continues to have firm area above lateral incision at pec area   She needed assist to come to sit to stand and contact guard for balance during walking with rolling walder    Personal Factors and Comorbidities  Time since onset of injury/illness/exacerbation    Examination-Activity Limitations  Bathing;Lift;Reach Overhead;Dressing    Stability/Clinical Decision Making  Evolving/Moderate complexity    Rehab Potential  Good    PT Duration  8 weeks    PT Treatment/Interventions  ADLs/Self Care Home Management;Therapeutic activities;Therapeutic exercise;Manual lymph drainage;Manual techniques;Scar mobilization;Passive range of motion;Taping;Joint Manipulations    PT Next Visit Plan  Cont STM to tight muscles in right upper quadrant. Continue gentle PROM to Rt shoulder, continue MLD Rt lateral trunk, and cont pulleys.progress gently.  Pt will not come to PT on chemo weeks       Patient will benefit from skilled therapeutic intervention in order to improve the following deficits and impairments:  Increased muscle spasms, Decreased range of motion, Decreased scar mobility, Decreased knowledge of precautions, Increased edema, Decreased strength, Increased fascial restricitons, Postural dysfunction, Pain, Impaired UE functional use  Visit Diagnosis: Stiffness of right shoulder, not elsewhere classified  Acute pain of right shoulder  Localized edema  Muscle weakness (generalized)  Abnormal posture     Problem List Patient Active Problem List   Diagnosis Date Noted  . Lung metastases ([place]) 05/13/2019  . Fever   . Goals of care, counseling/discussion 04/25/2019  . 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 Pleural effusion 04/11/2019  . S/P mastectomy 03/04/2019  . Anemia due to  antineoplastic chemotherapy 02/04/2019  . Port-A-Cath in place 01/07/2019  . Family history of breast cancer   . Malignant neoplasm of lower-outer quadrant of right breast of female, estrogen receptor negative ([place]) 11/04/2018  . Thoracic  spine pain 05/03/2018  . Hypokalemia 02/13/2017  . Chronic right hip pain 11/26/2015  . Hyperlipidemia 12/23/2010  . ALLERGIC RHINITIS CAUSE UNSPECIFIED 07/24/2008  . GERD 10/14/2007  . IBS 10/14/2007   Donato Heinz. Owens Shark, PT   Norwood Levo 05/27/2019, 2:45 PM  [place], [place], [address] Phone: [phone]   Fax:  [phone]  Name: Brittany Rodgers MRN: [id number] Date of Birth: [birth year]  PHYSICAL THERAPY DISCHARGE SUMMARY  Visits from Start of Care: 6  Current functional level related to goals / functional outcomes: See above- pt passed away on 2019-07-01  Remaining deficits: See above   Education / Equipment: HEP  Plan: Patient agrees to discharge.  Patient goals were not met. Patient is being discharged due to a change in medical status.  ?????     Allyson Sabal [place], [place] 06/12/19 9:32 AM

## 2019-05-28 ENCOUNTER — Telehealth: Payer: Self-pay | Admitting: *Deleted

## 2019-05-28 NOTE — Telephone Encounter (Signed)
This RN returned call to pt per her VM- Brittany Rodgers wanted to verify appointments for tomorrow as well as to inquire about transportation per this office.  This RN verified her appointments as well as sent an in box message per [facility] e-mail to Drucie Ip for transportation needs.  Note pt's sister [name] was present during call ( nurse was on speaker ) - who states overall PleurX drainage going well with now only having to drain every 3rd day. She did notice yesterday - " fluid was darker then normally " - Brittany Rodgers tolerated drainage well with no complaints.  This RN discussed above is not unusual as long as good flow with pt tolerating without discomfort.  No other needs at present.

## 2019-05-28 NOTE — Progress Notes (Signed)
[place]  [phone]) [phone] [phone]) [phone]    ID: Brittany Rodgers DOB: 03/06/1971  MR#: [id number]  ATF#:[id number]  Patient Care Team: [name], MD as PCP - [name], MD as Consulting Physician (General Surgery) Raynard Mapps, [name], MD as Consulting Physician (Oncology) [name], MD as Attending Physician (Radiation Oncology) [name], MD as Consulting Physician (Gastroenterology) [name], MD as Consulting Physician (Obstetrics and Gynecology) [name], RN as Oncology Nurse Navigator [name], RN as Oncology Nurse Navigator [name], MD as Consulting Physician (Plastic Surgery) OTHER MD: Dr. Siri Cole (urology)    CHIEF COMPLAINT: Triple negative breast cancer  CURRENT TREATMENT: pembrolizumab; carboplatin, gemcitabine   INTERVAL HISTORY: Brittany Rodgers returns today for follow-up and treatment of her triple negative breast cancer.  She is accompanied by 1 of her sisters.  She was started on carboplatin and gemcitabine on 05/13/2019.  She received her day 8 dose on 05/19/2019.  She tolerated this well.  She had no problems with nausea or vomiting, mouth sores, or other acute problems.  She did say that it made her feel tired.  Per telephone note with our nurse on 05/28/2019, Brittany's sister, [name], is draining Brittany's pleural catheter every 3 days.  At the most recent drainage the only obtained 200 cc.  The fluid is burgundy colored according to the patient.  [name] says she is getting everything in order meaning all her medications.  As she has a box where she keeps everything and they are very careful what she takes on the right at all down.  She gets up past breakfast watches TV does her female pays her bills has lunch takes a nap and then spends time with her family in the evening.  She sleeps well at night.  REVIEW OF SYSTEMS: Brittany Rodgers does me she has a great deal of difficulty going up and  down to 3 or 4 steps to her house.  It is her own home.  They would like to build a ramp and when they do then we could get a wheelchair which will make going in and out easier for her.  Pain is not an issue at this point.  She is having good bowel movements with the help of MiraLAX.  Detailed review of systems today was otherwise stable   HISTORY OF CURRENT ILLNESS: From the original intake note:  Brittany Rodgers presented with a palpable right breast lump and associated tenderness for approximately 1 week. She called her doctor immediately following  She underwent unilateral right diagnostic mammography with tomography and right breast ultrasonography at [facility] on 10/28/2018 showing: Breast Density Category C. Radiopaque BB was placed at the site of the patient's palpable lump in the central right breast. An irregular hyperdense mass with associated increased trabecular thickening is seen in the deep central right breast. On physical exam, a firm, fixed mass in the central right breast is palpated. Target ultrasound is performed, showing and irregular hypoechoic mass at the 8 o'clock position 3 cm from the nipple. It measures 3.6 x 2.9 x 2.3 cm. There is associated peripheral vascularity. Evaluation of the right axilla demonstrates a single prominent lymph node demonstrating 0.4 cm cortical thickening. The remaining lymph nodes ave normal appearance with a thin, symmetric cortex.   Accordingly on 10/29/2018 she proceeded to biopsy of the right breast area in question. The pathology from this procedure showed ([id number]): invasive ductal carcinoma, grade III. Prognostic indicators significant for: estrogen receptor,  0% negative and progesterone receptor, 0% negative. Proliferation marker Ki67 at 80%. HER2 negative (1+) by immunohistochemistry.  Biopsy of the right axilla was performed on the same day. The pathology from this procedure showed ([id number]): lymph node, negative for carcinoma.   This was felt to be concordant  The patient's subsequent history is as detailed below.   PAST MEDICAL HISTORY: Past Medical History:  Diagnosis Date   Cancer [facility]    BREAST CANCER RIGHT BREAST   Chronic cough    Family history of breast cancer    GERD (gastroesophageal reflux disease)    Hyperlipidemia    IBS (irritable bowel syndrome)    sees Dr. Silvano Rusk    Routine gynecological examination    sees Dr. Freda Munro     PAST SURGICAL HISTORY: Past Surgical History:  Procedure Laterality Date   CESAREAN SECTION     IR PERC PLEURAL DRAIN W/INDWELL CATH W/IMG GUIDE  05/01/2019   MASTECTOMY W/ SENTINEL NODE BIOPSY Right 03/04/2019   Procedure: RIGHT MASTECTOMY WITH RIGHT AXILLARY SENTINEL LYMPH NODE BIOPSY;  Surgeon: Rolm Bookbinder, MD;  Location: [facility];  Service: General;  Laterality: Right;   PORTACATH PLACEMENT Right 11/14/2018   Procedure: INSERTION PORT-A-CATH WITH ULTRASOUND;  Surgeon: Rolm Bookbinder, MD;  Location: [facility];  Service: General;  Laterality: Right;   ROOT CANAL       FAMILY HISTORY: Family History  Problem Relation Age of Onset   Hypertension Other    Breast cancer Other        mat first cousin's daughter   Breast cancer Maternal Aunt    Hypertension Mother    Breast cancer Maternal Aunt    Breast cancer Maternal Aunt    Brittany Rodgers's father died from an unknown cause at age 69. [name] died from unknown health complications at age 91. The patient has 4 sisters. Brittany Rodgers has 3 maternal aunts that have been diagnosed with breast cancer. Patient denies anyone in her family having ovarian, prostate, or pancreatic cancer.    GYNECOLOGIC HISTORY:  No LMP recorded. (Menstrual status: Chemotherapy). 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 Her husband, Inda Mcglothen, is a Administrator. The patient has two children, [name] and [name], from another relationship. [name] is 79, lives in [place]. [name] is 66, lives in [place], and is in Mud[employer].  The patient's husband [name] has a child, [name], from another relationship; she lives in [place] and is in school. The patient has 5 grandchildren. She attends the [employer].   ADVANCED DIRECTIVES: Brittany Rodgers would like to change her HCPOA    HEALTH MAINTENANCE: Social History   Tobacco Use   Smoking status: Never Smoker   Smokeless tobacco: Never Used  Substance Use Topics   Alcohol use: No    Alcohol/week: 0.0 standard drinks   Drug use: No    Colonoscopy: yes  PAP: 02/2018  Bone density: no   Allergies  Allergen Reactions   Barbiturates Other (See Comments)    Keeps pt awake   Methocarbamol Itching   Sulfonamide Derivatives Itching    Current Outpatient Medications  Medication Sig Dispense Refill   benzonatate (TESSALON) 100 MG capsule Take 1 capsule (100 mg total) by mouth 3 (three) times daily as needed for cough. 20 capsule 0   docusate  sodium (COLACE) 100 MG capsule Take 2 capsules (200 mg total) by mouth 2 (two) times daily. 10 capsule 0   lidocaine-prilocaine (EMLA) cream Apply to affected area once 30 g 3   metoprolol tartrate (LOPRESSOR) 50 MG tablet Take 1 tablet (50 mg total) by mouth 2 (two) times daily. 60 tablet 0   morphine (MS CONTIN) 15 MG 12 hr tablet Take 1 tablet (15 mg total) by mouth every 12 (twelve) hours. 60 tablet 0   morphine (MSIR) 15 MG tablet Take 1 tablet (15 mg total) by mouth every 4 (four) hours as needed for moderate pain. 30 tablet 0   Omeprazole-Sodium Bicarbonate (ZEGERID) 20-1100 MG CAPS capsule Take 1 capsule by mouth daily before breakfast. 90 each 3   polyethylene glycol (MIRALAX / GLYCOLAX) 17 g packet Take 17 g by mouth 2 (two) times daily. 14 each 0   prochlorperazine  (COMPAZINE) 10 MG tablet Take 1 tablet (10 mg total) by mouth every 6 (six) hours as needed for nausea or vomiting. 30 tablet 0   senna-docusate (SENOKOT-S) 8.6-50 MG tablet Take 2 tablets by mouth 2 (two) times daily. 30 tablet 0   Current Facility-Administered Medications  Medication Dose Route Frequency Provider Last Rate Last Dose   alteplase (CATHFLO ACTIVASE) injection 2 mg  2 mg Intracatheter Once Kalicia Dufresne, [name], MD         OBJECTIVE: Young African-American woman examined in a wheelchair  Vitals:   05/29/19 1427  BP: 97/72  Pulse: (!) 114  Resp: 18  Temp: 98.5 F (36.9 C)  SpO2: 94%     Body mass index is 28.33 kg/m.   Wt Readings from Last 3 Encounters:  05/29/19 154 lb 14.4 oz (70.3 kg)  05/19/19 149 lb 9.6 oz (67.9 kg)  05/07/19 175 lb 4.3 oz (79.5 kg)   ECOG FS:3 - Symptomatic, >50% confined to bed  Sclerae unicteric, EOMs intact Wearing a mask No cervical or supraclavicular adenopathy Lungs no rales or rhonchi Heart regular rate and rhythm Abd soft, nontender, positive bowel sounds Neuro: nonfocal, well oriented, appropriate affect Breasts: Deferred  LAB RESULTS:  CMP     Component Value Date/Time   NA 136 05/19/2019 1220   K 4.0 05/19/2019 1220   CL 97 (L) 05/19/2019 1220   CO2 23 05/19/2019 1220   GLUCOSE 108 (H) 05/19/2019 1220   BUN 23 (H) 05/19/2019 1220   CREATININE 1.17 (H) 05/19/2019 1220   CREATININE 0.73 02/11/2019 0859   CALCIUM 10.4 (H) 05/19/2019 1220   PROT 7.4 05/19/2019 1220   ALBUMIN 2.6 (L) 05/19/2019 1220   AST 30 05/19/2019 1220   AST 6 (L) 02/11/2019 0859   ALT 96 (H) 05/19/2019 1220   ALT 20 02/11/2019 0859   ALKPHOS 167 (H) 05/19/2019 1220   BILITOT 0.4 05/19/2019 1220   BILITOT 0.3 02/11/2019 0859   GFRNONAA 55 (L) 05/19/2019 1220   GFRNONAA >60 02/11/2019 0859   GFRAA >60 05/19/2019 1220   GFRAA >60 02/11/2019 0859    No results found for: TOTALPROTELP, ALBUMINELP, A1GS, A2GS, BETS, BETA2SER, GAMS, MSPIKE,  SPEI  No results found for: KPAFRELGTCHN, LAMBDASER, KAPLAMBRATIO  Lab Results  Component Value Date   WBC 2.9 (L) 05/19/2019   NEUTROABS 1.5 (L) 05/19/2019   HGB 12.4 05/19/2019   HCT 41.9 05/19/2019   MCV 79.5 (L) 05/19/2019   PLT 289 05/19/2019    _0 @  No results found for: LABCA2  No components found for: [id number]  No results for input(s):  INR in the last 168 hours.  No results found for: LABCA2  No results found for: EXH371  No results found for: IRC789  No results found for: FYB017  Lab Results  Component Value Date   CA2729 17.6 04/02/2019    No components found for: HGQUANT  No results found for: CEA1 / No results found for: CEA1   No results found for: AFPTUMOR  No results found for: CHROMOGRNA  No results found for: PSA1  No visits with results within 3 Day(s) from this visit.  Latest known visit with results is:  Appointment on 05/19/2019  Component Date Value Ref Range Status   WBC 05/19/2019 2.9* 4.0 - 10.5 K/uL Final   RBC 05/19/2019 5.27* 3.87 - 5.11 MIL/uL Final   Hemoglobin 05/19/2019 12.4  12.0 - 15.0 g/dL Final   HCT 05/19/2019 41.9  36.0 - 46.0 % Final   MCV 05/19/2019 79.5* 80.0 - 100.0 fL Final   MCH 05/19/2019 23.5* 26.0 - 34.0 pg Final   MCHC 05/19/2019 29.6* 30.0 - 36.0 g/dL Final   RDW 05/19/2019 20.2* 11.5 - 15.5 % Final   Platelets 05/19/2019 289  150 - 400 K/uL Final   nRBC 05/19/2019 0.0  0.0 - 0.2 % Final   Neutrophils Relative % 05/19/2019 51  % Final   Neutro Abs 05/19/2019 1.5* 1.7 - 7.7 K/uL Final   Lymphocytes Relative 05/19/2019 43  % Final   Lymphs Abs 05/19/2019 1.3  0.7 - 4.0 K/uL Final   Monocytes Relative 05/19/2019 3  % Final   Monocytes Absolute 05/19/2019 0.1  0.1 - 1.0 K/uL Final   Eosinophils Relative 05/19/2019 2  % Final   Eosinophils Absolute 05/19/2019 0.1  0.0 - 0.5 K/uL Final   Basophils Relative 05/19/2019 1  % Final   Basophils Absolute 05/19/2019 0.0  0.0 - 0.1  K/uL Final   Immature Granulocytes 05/19/2019 0  % Final   Abs Immature Granulocytes 05/19/2019 0.01  0.00 - 0.07 K/uL Final   Performed at [facility], [place] [address] [place], [place] [address]   Sodium 05/19/2019 136  135 - 145 mmol/L Final   Potassium 05/19/2019 4.0  3.5 - 5.1 mmol/L Final   Chloride 05/19/2019 97* 98 - 111 mmol/L Final   CO2 05/19/2019 23  22 - 32 mmol/L Final   Glucose, Bld 05/19/2019 108* 70 - 99 mg/dL Final   BUN 05/19/2019 23* 6 - 20 mg/dL Final   Creatinine, Ser 05/19/2019 1.17* 0.44 - 1.00 mg/dL Final   Calcium 05/19/2019 10.4* 8.9 - 10.3 mg/dL Final   Total Protein 05/19/2019 7.4  6.5 - 8.1 g/dL Final   Albumin 05/19/2019 2.6* 3.5 - 5.0 g/dL Final   AST 05/19/2019 30  15 - 41 U/L Final   ALT 05/19/2019 96* 0 - 44 U/L Final   Alkaline Phosphatase 05/19/2019 167* 38 - 126 U/L Final   Total Bilirubin 05/19/2019 0.4  0.3 - 1.2 mg/dL Final   GFR calc non Af Amer 05/19/2019 55* >60 mL/min Final   GFR calc Af Amer 05/19/2019 >60  >60 mL/min Final   Anion gap 05/19/2019 16* 5 - 15 Final   Performed at [facility], [place] [address], [place], [place] [address]    (this displays the last labs from the last 3 days)  No results found for: TOTALPROTELP, ALBUMINELP, A1GS, A2GS, BETS, BETA2SER, GAMS, MSPIKE, SPEI (this displays SPEP labs)  No results found for: KPAFRELGTCHN, LAMBDASER, KAPLAMBRATIO (kappa/lambda light  chains)  No results found for: HGBA, HGBA2QUANT, HGBFQUANT, HGBSQUAN (Hemoglobinopathy evaluation)   No results found for: LDH  No results found for: IRON, TIBC, IRONPCTSAT (Iron and TIBC)  No results found for: FERRITIN  Urinalysis    Component Value Date/Time   COLORURINE YELLOW 05/12/2019 [place] 05/12/2019 0724   LABSPEC 1.012 05/12/2019 0724   PHURINE 7.0 05/12/2019 0724   GLUCOSEU NEGATIVE 05/12/2019 0724   HGBUR NEGATIVE 05/12/2019 0724    BILIRUBINUR NEGATIVE 05/12/2019 0724   BILIRUBINUR negative 05/31/2018 [place] 05/12/2019 0724   PROTEINUR NEGATIVE 05/12/2019 0724   UROBILINOGEN 0.2 05/31/2018 1011   UROBILINOGEN 0.2 05/18/2010 1042   NITRITE NEGATIVE 05/12/2019 0724   LEUKOCYTESUR TRACE (A) 05/12/2019 0724     STUDIES:  Dg Chest 2 View  Result Date: 05/02/2019 CLINICAL DATA:  Fever, RIGHT-side pleural drainage catheter placed yesterday EXAM: CHEST - 2 VIEW COMPARISON:  04/25/2019 Correlation: CTA chest 04/23/2019 FINDINGS: RIGHT jugular Port-A-Cath with tip projecting over SVC. New RIGHT PleurX catheter. Enlargement of cardiac silhouette. Known RIGHT infrahilar mass incompletely visualized due to partial obscuration by moderate to large RIGHT pleural effusion and basilar atelectasis. LEFT lung clear. No pneumothorax. Bone destruction of the anterior RIGHT fourth and fifth ribs. IMPRESSION: Enlargement of cardiac silhouette. Known RIGHT perihilar mass with bone destruction of the anterior RIGHT fourth and fifth ribs. Large RIGHT pleural effusion and significant basilar atelectasis. Electronically Signed   By: Lavonia Dana M.D.   On: 05/02/2019 08:52   Ct Head Wo Contrast  Result Date: 05/02/2019 CLINICAL DATA:  Altered mental status, metastatic breast cancer EXAM: CT HEAD WITHOUT CONTRAST TECHNIQUE: Contiguous axial images were obtained from the base of the skull through the vertex without intravenous contrast. COMPARISON:  05/17/2010 FINDINGS: Brain: No evidence of acute infarction, hemorrhage, hydrocephalus, extra-axial collection or mass lesion/mass effect. Periventricular and deep white matter hypodensity. Vascular: No hyperdense vessel or unexpected calcification. Skull: Normal. Negative for fracture or focal lesion. Sinuses/Orbits: No acute finding. Other: None. IMPRESSION: No acute intracranial pathology. Periventricular and deep white matter hypodensity which is advanced for patient age although may  nonetheless reflect small-vessel white matter disease. No noncontrast CT evidence of intracranial metastatic disease. Consider contrast enhanced MRI to more sensitively evaluate for metastatic disease, if suspected. Electronically Signed   By: Eddie Candle M.D.   On: 05/02/2019 16:40   Dg Chest Port 1 View  Result Date: 05/10/2019 CLINICAL DATA:  Shortness of breath. EXAM: PORTABLE CHEST 1 VIEW COMPARISON:  Radiograph 05/05/2019, additional priors. FINDINGS: Accessed right chest port remains in place. Right PleurX catheter tip at the apex. Large pleural effusion has decreased from prior exam with some improving aeration of the right lung apex. Similar leftward mediastinal shift to before. Left lung is grossly clear. No pneumothorax or other change from prior exam. IMPRESSION: Right PleurX catheter in place with persistent but diminishing large right pleural effusion. Improving right apical aeration. Otherwise unchanged exam. Electronically Signed   By: Keith Rake M.D.   On: 05/10/2019 22:22   Dg Chest Port 1 View  Result Date: 05/05/2019 CLINICAL DATA:  Sepsis. EXAM: PORTABLE CHEST 1 VIEW COMPARISON:  Radiographs of May 02, 2019. FINDINGS: Stable cardiomediastinal silhouette. Right-sided chest tube is unchanged in position. Right internal jugular Port-A-Cath is unchanged. Large right pleural effusion is noted which is significantly increased in size compared to prior exam. Underlying atelectasis or infiltrate cannot be excluded. Left lung is clear. Bony thorax is unremarkable. No pneumothorax is  noted. IMPRESSION: Large right pleural effusion is noted which is enlarged compared to prior exam. Stable position of right-sided chest tube. Electronically Signed   By: Marijo Conception M.D.   On: 05/05/2019 09:17   Ir Perc Pleural Drain W/indwell Cath W/img Guide  Result Date: 05/01/2019 CLINICAL DATA:  History of metastatic breast cancer, now with recurrent symptomatic malignant right-sided pleural  effusion. Request now made for placement of a right-sided tunneled pleural catheter for palliative purposes. EXAM: INSERTION OF TUNNELED RIGHT SIDED PLEURAL DRAINAGE CATHETER COMPARISON:  Ultrasound-guided thoracentesis-04/24/2019; 04/12/2019; chest CTA-04/23/2019 MEDICATIONS: Ancef 2 gm IV; Antibiotic was administered in an appropriate time interval for the procedure. ANESTHESIA/SEDATION: Moderate (conscious) sedation was employed during this procedure. A total of Versed 4 mg and Fentanyl 200 mcg was administered intravenously. Moderate Sedation Time: 16 minutes. The patient's level of consciousness and vital signs were monitored continuously by radiology nursing throughout the procedure under my direct supervision. FLUOROSCOPY TIME:  FLUOROSCOPY TIME 12 seconds (3 mGy) COMPLICATIONS: None immediate. PROCEDURE: The procedure, risks, benefits, and alternatives were explained to the patient, who wish to proceed with the placement of this permanent pleural catheter as the patient is seeking palliative care. The patient understand and consent to the procedure. The right inferior lateral chest and upper abdomen were prepped with Chlorhexidine in a sterile fashion, and a sterile drape was applied covering the operative field. Attention was made to avoid the patient's known right anterior chest wall mass. A sterile gown and sterile gloves were used for the procedure. Initial ultrasound scanning and fluoroscopic imaging demonstrates a recurrent moderate to large pleural effusion. Under direct ultrasound guidance, the right inferior lateral pleural space was accessed with a Yueh sheath needle after the overlying soft tissues were anesthetized with 1% lidocaine with epinephrine. An Amplatz super stiff wire was then advanced under fluoroscopy into the pleural space. A 15.5 French tunneled Pleur-X catheter was tunneled from an incision within the right upper abdominal quadrant to the access site. The pleural access site was  serially dilated under fluoroscopy, ultimately allowing placement of a peel-away sheath. The catheter was advanced through the peel-away sheath. The sheath was then removed. Final catheter positioning was confirmed with a fluoroscopic radiographic image. The access incision was closed with subcutaneous subcuticular 4-0 Vicryl, Dermabond and Steri-Strips. A Prolene retention suture was applied at the catheter exit site. Large volume thoracentesis was performed through the new catheter utilizing provided bulb vacuum assisted drainage bag. The patient tolerated the above procedure well without immediate postprocedural complication. FINDINGS: Preprocedural ultrasound scanning demonstrates a recurrent large sized anechoic right sided pleural effusion. After ultrasound and fluoroscopic guided placement, the catheter is directed right lung apex Following catheter placement, approximately 1.3 L of bloody pleural fluid was removed. IMPRESSION: Successful placement of permanent, tunneled right pleural drainage catheter via lateral approach. 1.3 liters of bloody pleural fluid was removed after catheter placement. Electronically Signed   By: Sandi Mariscal M.D.   On: 05/01/2019 15:42     ELIGIBLE FOR AVAILABLE RESEARCH PROTOCOL: Declined   ASSESSMENT: 48 y.o. [place] Parkins, [place] woman status post right breast lower outer quadrant biopsy 10/29/2018 for a clinical T2 N0, stage IIB invasive ductal carcinoma, grade 3, triple negative, with an MIB-1 of 80%  (a) CT of the chest 11/15/2018 shows possible liver metastases, but liver biopsy 11/25/2018 consistent with hemangioma  (b) bone scan 11/15/2018 shows no suspicious bone lesions  (1) neoadjuvant chemotherapy consisting of doxorubicin and cyclophosphamide in dose dense fashion x4, started 11/26/2018, completed 01/07/2019,  followed by weekly paclitaxel and carboplatin x12 starting 01/28/2019  (2) patient desires ovarian function preservation:   (a) Zoladex started  11/08/2018, discontinued 02/04/2019  (b) patient is status post bilateral tubal ligation  (3)status post right mastectomy with sentinel lymph node sampling 03/04/2019 for aypTac ypN1ainvasive ductal carcinoma grade 3, repeat prognostic panel again triple negative (a)a total of 6 axillary lymph nodes removed, 2 sentinel ones, 1positive  (4) adjuvant radiationto be considered  (5) genetics counseling on 11/28/2018, testing was recommended, but declined by patient.  METASTATIC DISEASE: July 2020 (6)Staging studies: (a) chest CT scan and bone scan 11/15/2018 show a liver mass, no lung or bone lesions (b) liver biopsy 11/25/2018 shows a hemangioma (c)chest CT Angio 03/26/2019 again shows liver mass, right breast "seroma"             (d) cytology from R thoracentesis 04/12/2019 nondiagnostic             (e) CT angio 04/23/2019 shows recurrent right effusion, multiple bilateral pulmonary nodules, invasive anterior right chest wall lesion  (f) biopsy of the chest wall lesion on 04/24/2019 and cytology from the right pleural effusion the same day confirms metastatic breast cancer  (g) CARIS study results pending  (7) pembrolizumabstarted 04/02/2019, to be repeated every 21 days  (8) carboplatin and gemcitabine started 05/13/2019, to be repeated days 1 and 8 of each 21-day cycle  (9) pain control:  (a) currently on MS Contin 57m twice daily with MSIR 15 mg as needed  (b) so far no need for bowel prophylaxis  (10) 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  Her breathing is more normal.  The drainage appears to be decreasing.  She denies pleurisy, coughing, or worsening shortness of breath.  She still of course has a very limited functional status.  She tolerated her first cycle of  chemotherapy with no significant complications.  She will return 06/13/2019 for the beginning of cycle 2.  We will see her with the day 8 treatment every 3 weeks.  After the second cycle we will consider stopping the drainage for a few days perhaps a week and then repeating a chest x-ray.  If the fluid has not reaccumulated we could consider pulling the Pleurx at that time.  She is having trouble getting in and out of the house.  We will see if we can facilitate their obtaining a ramp.  Once they have a ramp they would benefit from a wheelchair, which we can easily obtain for her.  They know to call for any other issue that may develop before the next visit   GChauncey Cruel MD Medical Oncology and Hematology [facility] [place] [address]Tel. [phone]   Fax. [phone]  I, KWilburn Mylar am acting as scribe for Dr. GVirgie Dad Monifah Freehling.  The next visit.  I, GLurline DelMD, have reviewed the above documentation for accuracy and completeness, and I agree with the above.

## 2019-05-28 NOTE — Telephone Encounter (Signed)
[facility] faxed Prior authorization request for Morphine Sulf ER 15 mg tab.  Request to [facility] letter tray receptacle of Prior Authorization forms for review.

## 2019-05-29 ENCOUNTER — Other Ambulatory Visit: Payer: Self-pay | Admitting: Oncology

## 2019-05-29 ENCOUNTER — Other Ambulatory Visit: Payer: Self-pay

## 2019-05-29 ENCOUNTER — Inpatient Hospital Stay: Payer: BC Managed Care – PPO | Attending: Adult Health | Admitting: Oncology

## 2019-05-29 ENCOUNTER — Encounter: Payer: Self-pay | Admitting: General Practice

## 2019-05-29 ENCOUNTER — Inpatient Hospital Stay: Payer: BC Managed Care – PPO

## 2019-05-29 VITALS — BP 97/72 | HR 114 | Temp 98.5°F | Resp 18 | Ht 62.0 in | Wt 154.9 lb

## 2019-05-29 DIAGNOSIS — J910 Malignant pleural effusion: Secondary | ICD-10-CM | POA: Diagnosis not present

## 2019-05-29 DIAGNOSIS — Z171 Estrogen receptor negative status [ER-]: Secondary | ICD-10-CM

## 2019-05-29 DIAGNOSIS — C50511 Malignant neoplasm of lower-outer quadrant of right female breast: Secondary | ICD-10-CM

## 2019-05-29 DIAGNOSIS — C7800 Secondary malignant neoplasm of unspecified lung: Secondary | ICD-10-CM | POA: Diagnosis not present

## 2019-05-29 LAB — COMPREHENSIVE METABOLIC PANEL
ALT: 23 U/L (ref 0–44)
AST: 9 U/L — ABNORMAL LOW (ref 15–41)
Albumin: 2.8 g/dL — ABNORMAL LOW (ref 3.5–5.0)
Alkaline Phosphatase: 224 U/L — ABNORMAL HIGH (ref 38–126)
Anion gap: 13 (ref 5–15)
BUN: 24 mg/dL — ABNORMAL HIGH (ref 6–20)
CO2: 24 mmol/L (ref 22–32)
Calcium: 11.7 mg/dL — ABNORMAL HIGH (ref 8.9–10.3)
Chloride: 95 mmol/L — ABNORMAL LOW (ref 98–111)
Creatinine, Ser: 1.03 mg/dL — ABNORMAL HIGH (ref 0.44–1.00)
GFR calc Af Amer: >60 mL/min (ref >60)
GFR calc non Af Amer: >60 mL/min (ref >60)
Glucose, Bld: 94 mg/dL (ref 70–99)
Potassium: 4.4 mmol/L (ref 3.5–5.1)
Sodium: 132 mmol/L — ABNORMAL LOW (ref 135–145)
Total Bilirubin: 0.4 mg/dL (ref 0.3–1.2)
Total Protein: 6.2 g/dL — ABNORMAL LOW (ref 6.5–8.1)

## 2019-05-29 LAB — CBC WITH DIFFERENTIAL/PLATELET
Abs Immature Granulocytes: 3.74 10*3/uL — ABNORMAL HIGH (ref 0.00–0.07)
Basophils Absolute: 0.1 10*3/uL (ref 0.0–0.1)
Basophils Relative: 0 %
Eosinophils Absolute: 0.4 10*3/uL (ref 0.0–0.5)
Eosinophils Relative: 2 %
HCT: 28.1 % — ABNORMAL LOW (ref 36.0–46.0)
Hemoglobin: 8.8 g/dL — ABNORMAL LOW (ref 12.0–15.0)
Immature Granulocytes: 13 %
Lymphocytes Relative: 9 %
Lymphs Abs: 2.6 10*3/uL (ref 0.7–4.0)
MCH: 23.5 pg — ABNORMAL LOW (ref 26.0–34.0)
MCHC: 31.3 g/dL (ref 30.0–36.0)
MCV: 75.1 fL — ABNORMAL LOW (ref 80.0–100.0)
Monocytes Absolute: 3.3 10*3/uL — ABNORMAL HIGH (ref 0.1–1.0)
Monocytes Relative: 12 %
Neutro Abs: 18 10*3/uL — ABNORMAL HIGH (ref 1.7–7.7)
Neutrophils Relative %: 64 %
Platelets: 42 10*3/uL — ABNORMAL LOW (ref 150–400)
RBC: 3.74 MIL/uL — ABNORMAL LOW (ref 3.87–5.11)
RDW: 20 % — ABNORMAL HIGH (ref 11.5–15.5)
WBC: 28.1 10*3/uL — ABNORMAL HIGH (ref 4.0–10.5)
nRBC: 0.2 % (ref 0.0–0.2)

## 2019-05-29 MED ORDER — SODIUM CHLORIDE 0.9% FLUSH
10.0000 mL | Freq: Once | INTRAVENOUS | Status: AC
  Administered 2019-05-29: 15:00:00 10 mL via INTRAVENOUS
  Filled 2019-05-29: qty 10

## 2019-05-29 MED ORDER — ALTEPLASE 2 MG IJ SOLR
2.0000 mg | Freq: Once | INTRAMUSCULAR | Status: DC
  Filled 2019-05-29: qty 2

## 2019-05-29 MED ORDER — HEPARIN SOD (PORK) LOCK FLUSH 100 UNIT/ML IV SOLN
500.0000 [IU] | Freq: Once | INTRAVENOUS | Status: AC
  Administered 2019-05-29: 15:00:00 500 [IU] via INTRAVENOUS
  Filled 2019-05-29: qty 5

## 2019-05-29 NOTE — Progress Notes (Signed)
[facility] CSW Progress Notes  Request received from medical oncologist to explore options for in home ramp in home.  Left VM for patient to discuss, awaiting return call.  Edwyna Shell, LCSW Clinical Social Worker Phone:  [phone]

## 2019-05-29 NOTE — Addendum Note (Signed)
Addended by: Chauncey Cruel on: 05/29/2019 05:37 PM   Modules accepted: Orders

## 2019-05-30 ENCOUNTER — Encounter (HOSPITAL_COMMUNITY): Payer: Self-pay | Admitting: General Practice

## 2019-05-30 ENCOUNTER — Telehealth: Payer: Self-pay | Admitting: Oncology

## 2019-05-30 NOTE — Telephone Encounter (Signed)
I left a message regarding schedule and to request a print out

## 2019-05-30 NOTE — Progress Notes (Signed)
[facility] CSW Progress Notes  Patient referred to local church for possible help w building ramp at her house.  Patient agreeable to this referral.  Edwyna Shell, LCSW Clinical Social Worker Phone:  [phone]

## 2019-06-03 ENCOUNTER — Encounter: Payer: Self-pay | Admitting: *Deleted

## 2019-06-03 ENCOUNTER — Telehealth: Payer: Self-pay

## 2019-06-03 NOTE — Telephone Encounter (Signed)
Spoke with pt's sister, [name], who requested information regarding in home care services.  In basket msg sent to social work dept.

## 2019-06-03 NOTE — Progress Notes (Signed)
[facility] Work  Clinical Social Work received a referral from Therapist, sports for home care.  CSW contacted patients sister, [name] to offer support and assess for needs.  [name] stated that the family is working together to provide care for the patient, but would like to explore any additional assistance.  CSW and [name] discussed home health vs home care.  [name] understands that home care is an out of pocket cost, and Medicaid would be the only payer source.  Patient was also denied home health services when discharged from the hospital due to being out of network.  CSW and [name] explored applying for social security disability and Medicaid.  Patient was agreeable to applying for SSD with the [facility].  CSW completed and submitted referral.  [name] also plans to contact patients insurance company to explore available resources.   Johnnye Lana, MSW, LCSW, OSW-C Clinical Social Worker [facility] [phone]

## 2019-06-04 ENCOUNTER — Other Ambulatory Visit: Payer: BC Managed Care – PPO

## 2019-06-04 ENCOUNTER — Inpatient Hospital Stay (HOSPITAL_COMMUNITY)
Admission: EM | Admit: 2019-06-04 | Discharge: 2019-06-26 | DRG: 871 | Disposition: E | Payer: BC Managed Care – PPO | Attending: Internal Medicine | Admitting: Internal Medicine

## 2019-06-04 ENCOUNTER — Emergency Department (HOSPITAL_COMMUNITY): Payer: BC Managed Care – PPO

## 2019-06-04 ENCOUNTER — Other Ambulatory Visit: Payer: Self-pay

## 2019-06-04 ENCOUNTER — Inpatient Hospital Stay: Payer: BC Managed Care – PPO

## 2019-06-04 ENCOUNTER — Inpatient Hospital Stay (HOSPITAL_BASED_OUTPATIENT_CLINIC_OR_DEPARTMENT_OTHER): Payer: BC Managed Care – PPO | Admitting: Oncology

## 2019-06-04 ENCOUNTER — Encounter (HOSPITAL_COMMUNITY): Payer: Self-pay

## 2019-06-04 ENCOUNTER — Ambulatory Visit: Payer: BC Managed Care – PPO

## 2019-06-04 ENCOUNTER — Inpatient Hospital Stay (HOSPITAL_COMMUNITY): Payer: BC Managed Care – PPO

## 2019-06-04 DIAGNOSIS — R748 Abnormal levels of other serum enzymes: Secondary | ICD-10-CM

## 2019-06-04 DIAGNOSIS — Z171 Estrogen receptor negative status [ER-]: Secondary | ICD-10-CM

## 2019-06-04 DIAGNOSIS — N179 Acute kidney failure, unspecified: Secondary | ICD-10-CM | POA: Diagnosis present

## 2019-06-04 DIAGNOSIS — E872 Acidosis, unspecified: Secondary | ICD-10-CM

## 2019-06-04 DIAGNOSIS — I471 Supraventricular tachycardia: Secondary | ICD-10-CM | POA: Diagnosis present

## 2019-06-04 DIAGNOSIS — I10 Essential (primary) hypertension: Secondary | ICD-10-CM | POA: Diagnosis present

## 2019-06-04 DIAGNOSIS — G9341 Metabolic encephalopathy: Secondary | ICD-10-CM

## 2019-06-04 DIAGNOSIS — Z20828 Contact with and (suspected) exposure to other viral communicable diseases: Secondary | ICD-10-CM | POA: Diagnosis present

## 2019-06-04 DIAGNOSIS — D72829 Elevated white blood cell count, unspecified: Secondary | ICD-10-CM | POA: Diagnosis not present

## 2019-06-04 DIAGNOSIS — J9691 Respiratory failure, unspecified with hypoxia: Secondary | ICD-10-CM | POA: Diagnosis present

## 2019-06-04 DIAGNOSIS — C50511 Malignant neoplasm of lower-outer quadrant of right female breast: Secondary | ICD-10-CM

## 2019-06-04 DIAGNOSIS — Z959 Presence of cardiac and vascular implant and graft, unspecified: Secondary | ICD-10-CM

## 2019-06-04 DIAGNOSIS — C50919 Malignant neoplasm of unspecified site of unspecified female breast: Secondary | ICD-10-CM

## 2019-06-04 DIAGNOSIS — K7200 Acute and subacute hepatic failure without coma: Secondary | ICD-10-CM | POA: Diagnosis present

## 2019-06-04 DIAGNOSIS — E785 Hyperlipidemia, unspecified: Secondary | ICD-10-CM | POA: Diagnosis present

## 2019-06-04 DIAGNOSIS — Z515 Encounter for palliative care: Secondary | ICD-10-CM | POA: Diagnosis present

## 2019-06-04 DIAGNOSIS — F419 Anxiety disorder, unspecified: Secondary | ICD-10-CM | POA: Diagnosis present

## 2019-06-04 DIAGNOSIS — J189 Pneumonia, unspecified organism: Secondary | ICD-10-CM | POA: Diagnosis present

## 2019-06-04 DIAGNOSIS — R579 Shock, unspecified: Secondary | ICD-10-CM | POA: Diagnosis not present

## 2019-06-04 DIAGNOSIS — Z7189 Other specified counseling: Secondary | ICD-10-CM

## 2019-06-04 DIAGNOSIS — R6510 Systemic inflammatory response syndrome (SIRS) of non-infectious origin without acute organ dysfunction: Secondary | ICD-10-CM

## 2019-06-04 DIAGNOSIS — Z9011 Acquired absence of right breast and nipple: Secondary | ICD-10-CM

## 2019-06-04 DIAGNOSIS — D509 Iron deficiency anemia, unspecified: Secondary | ICD-10-CM

## 2019-06-04 DIAGNOSIS — R410 Disorientation, unspecified: Secondary | ICD-10-CM

## 2019-06-04 DIAGNOSIS — K589 Irritable bowel syndrome without diarrhea: Secondary | ICD-10-CM | POA: Diagnosis present

## 2019-06-04 DIAGNOSIS — D638 Anemia in other chronic diseases classified elsewhere: Secondary | ICD-10-CM | POA: Diagnosis present

## 2019-06-04 DIAGNOSIS — C7800 Secondary malignant neoplasm of unspecified lung: Secondary | ICD-10-CM | POA: Diagnosis present

## 2019-06-04 DIAGNOSIS — J910 Malignant pleural effusion: Secondary | ICD-10-CM

## 2019-06-04 DIAGNOSIS — Z803 Family history of malignant neoplasm of breast: Secondary | ICD-10-CM

## 2019-06-04 DIAGNOSIS — Z79891 Long term (current) use of opiate analgesic: Secondary | ICD-10-CM

## 2019-06-04 DIAGNOSIS — Z66 Do not resuscitate: Secondary | ICD-10-CM | POA: Diagnosis present

## 2019-06-04 DIAGNOSIS — E669 Obesity, unspecified: Secondary | ICD-10-CM | POA: Diagnosis present

## 2019-06-04 DIAGNOSIS — K219 Gastro-esophageal reflux disease without esophagitis: Secondary | ICD-10-CM | POA: Diagnosis present

## 2019-06-04 DIAGNOSIS — R0902 Hypoxemia: Secondary | ICD-10-CM

## 2019-06-04 DIAGNOSIS — A419 Sepsis, unspecified organism: Secondary | ICD-10-CM | POA: Diagnosis present

## 2019-06-04 DIAGNOSIS — Z79899 Other long term (current) drug therapy: Secondary | ICD-10-CM

## 2019-06-04 DIAGNOSIS — J90 Pleural effusion, not elsewhere classified: Secondary | ICD-10-CM

## 2019-06-04 DIAGNOSIS — R160 Hepatomegaly, not elsewhere classified: Secondary | ICD-10-CM | POA: Diagnosis present

## 2019-06-04 DIAGNOSIS — Z8249 Family history of ischemic heart disease and other diseases of the circulatory system: Secondary | ICD-10-CM

## 2019-06-04 DIAGNOSIS — R6521 Severe sepsis with septic shock: Secondary | ICD-10-CM | POA: Diagnosis present

## 2019-06-04 DIAGNOSIS — Z6831 Body mass index (BMI) 31.0-31.9, adult: Secondary | ICD-10-CM

## 2019-06-04 LAB — RAPID URINE DRUG SCREEN, HOSP PERFORMED
Amphetamines: NOT DETECTED
Barbiturates: NOT DETECTED
Benzodiazepines: NOT DETECTED
Cocaine: NOT DETECTED
Opiates: NOT DETECTED
Tetrahydrocannabinol: NOT DETECTED

## 2019-06-04 LAB — URINALYSIS, ROUTINE W REFLEX MICROSCOPIC
Bilirubin Urine: NEGATIVE
Glucose, UA: NEGATIVE mg/dL
Ketones, ur: 5 mg/dL — AB
Leukocytes,Ua: NEGATIVE
Nitrite: NEGATIVE
Protein, ur: >=300 mg/dL — AB
Specific Gravity, Urine: 1.02 (ref 1.005–1.030)
pH: 6 (ref 5.0–8.0)

## 2019-06-04 LAB — CBC WITH DIFFERENTIAL/PLATELET
Abs Immature Granulocytes: 7.9 10*3/uL — ABNORMAL HIGH (ref 0.00–0.07)
Basophils Absolute: 0 10*3/uL (ref 0.0–0.1)
Basophils Relative: 0 %
Eosinophils Absolute: 0 10*3/uL (ref 0.0–0.5)
Eosinophils Relative: 0 %
HCT: 29.6 % — ABNORMAL LOW (ref 36.0–46.0)
Hemoglobin: 8.9 g/dL — ABNORMAL LOW (ref 12.0–15.0)
Lymphocytes Relative: 6 %
Lymphs Abs: 5.3 10*3/uL — ABNORMAL HIGH (ref 0.7–4.0)
MCH: 23.1 pg — ABNORMAL LOW (ref 26.0–34.0)
MCHC: 30.1 g/dL (ref 30.0–36.0)
MCV: 76.9 fL — ABNORMAL LOW (ref 80.0–100.0)
Metamyelocytes Relative: 3 %
Monocytes Absolute: 7.9 10*3/uL — ABNORMAL HIGH (ref 0.1–1.0)
Monocytes Relative: 9 %
Myelocytes: 6 %
Neutro Abs: 66.8 10*3/uL — ABNORMAL HIGH (ref 1.7–7.7)
Neutrophils Relative %: 76 %
Platelets: 396 10*3/uL (ref 150–400)
RBC: 3.85 MIL/uL — ABNORMAL LOW (ref 3.87–5.11)
RDW: 21.7 % — ABNORMAL HIGH (ref 11.5–15.5)
WBC: 87.9 10*3/uL (ref 4.0–10.5)
nRBC: 3.3 % — ABNORMAL HIGH (ref 0.0–0.2)

## 2019-06-04 LAB — BLOOD GAS, ARTERIAL
Acid-base deficit: 6.1 mmol/L — ABNORMAL HIGH (ref 0.0–2.0)
Acid-base deficit: 7 mmol/L — ABNORMAL HIGH (ref 0.0–2.0)
Bicarbonate: 17.6 mmol/L — ABNORMAL LOW (ref 20.0–28.0)
Bicarbonate: 16.7 mmol/L — ABNORMAL LOW (ref 20.0–28.0)
Drawn by: 331471
Drawn by: 331471
FIO2: 21
FIO2: 21
O2 Saturation: 89.6 %
O2 Saturation: 91.7 %
Patient temperature: 98.6
Patient temperature: 98.6
pCO2 arterial: 29 mmHg — ABNORMAL LOW (ref 32.0–48.0)
pCO2 arterial: 28.6 mmHg — ABNORMAL LOW (ref 32.0–48.0)
pH, Arterial: 7.399 (ref 7.350–7.450)
pH, Arterial: 7.385 (ref 7.350–7.450)
pO2, Arterial: 67.5 mmHg — ABNORMAL LOW (ref 83.0–108.0)
pO2, Arterial: 73.4 mmHg — ABNORMAL LOW (ref 83.0–108.0)

## 2019-06-04 LAB — SALICYLATE LEVEL: Salicylate Lvl: <7 mg/dL (ref 2.8–30.0)

## 2019-06-04 LAB — DIC (DISSEMINATED INTRAVASCULAR COAGULATION)PANEL
D-Dimer, Quant: 11.2 ug/mL-FEU — ABNORMAL HIGH (ref 0.00–0.50)
Fibrinogen: 477 mg/dL — ABNORMAL HIGH (ref 210–475)
INR: 1.5 — ABNORMAL HIGH (ref 0.8–1.2)
Platelets: 331 10*3/uL (ref 150–400)
Prothrombin Time: 17.7 seconds — ABNORMAL HIGH (ref 11.4–15.2)
Smear Review: NONE SEEN
aPTT: 27 seconds (ref 24–36)

## 2019-06-04 LAB — CK TOTAL AND CKMB (NOT AT ARMC)
CK, MB: 9.7 ng/mL — ABNORMAL HIGH (ref 0.5–5.0)
Relative Index: INVALID (ref 0.0–2.5)
Total CK: 19 U/L — ABNORMAL LOW (ref 38–234)

## 2019-06-04 LAB — COMPREHENSIVE METABOLIC PANEL
ALT: 107 U/L — ABNORMAL HIGH (ref 0–44)
AST: 148 U/L — ABNORMAL HIGH (ref 15–41)
Albumin: 3 g/dL — ABNORMAL LOW (ref 3.5–5.0)
Alkaline Phosphatase: 241 U/L — ABNORMAL HIGH (ref 38–126)
Anion gap: 21 — ABNORMAL HIGH (ref 5–15)
BUN: 39 mg/dL — ABNORMAL HIGH (ref 6–20)
CO2: 20 mmol/L — ABNORMAL LOW (ref 22–32)
Calcium: 13.4 mg/dL (ref 8.9–10.3)
Chloride: 98 mmol/L (ref 98–111)
Creatinine, Ser: 2.66 mg/dL — ABNORMAL HIGH (ref 0.44–1.00)
GFR calc Af Amer: 23 mL/min — ABNORMAL LOW (ref >60)
GFR calc non Af Amer: 20 mL/min — ABNORMAL LOW (ref >60)
Glucose, Bld: 120 mg/dL — ABNORMAL HIGH (ref 70–99)
Potassium: 4.7 mmol/L (ref 3.5–5.1)
Sodium: 139 mmol/L (ref 135–145)
Total Bilirubin: 0.9 mg/dL (ref 0.3–1.2)
Total Protein: 6.7 g/dL (ref 6.5–8.1)

## 2019-06-04 LAB — ACETAMINOPHEN LEVEL: Acetaminophen (Tylenol), Serum: 11 ug/mL (ref 10–30)

## 2019-06-04 LAB — LIPASE, BLOOD: Lipase: 20 U/L (ref 11–51)

## 2019-06-04 LAB — LACTIC ACID, PLASMA
Lactic Acid, Venous: 7.3 mmol/L (ref 0.5–1.9)
Lactic Acid, Venous: 8.3 mmol/L (ref 0.5–1.9)
Lactic Acid, Venous: 7.3 mmol/L (ref 0.5–1.9)
Lactic Acid, Venous: 5.8 mmol/L (ref 0.5–1.9)
Lactic Acid, Venous: 5.3 mmol/L (ref 0.5–1.9)

## 2019-06-04 LAB — PROTIME-INR
INR: 1.4 — ABNORMAL HIGH (ref 0.8–1.2)
Prothrombin Time: 16.8 seconds — ABNORMAL HIGH (ref 11.4–15.2)

## 2019-06-04 LAB — PROCALCITONIN: Procalcitonin: 7.31 ng/mL

## 2019-06-04 LAB — URIC ACID: Uric Acid, Serum: 13.7 mg/dL — ABNORMAL HIGH (ref 2.5–7.1)

## 2019-06-04 LAB — SARS CORONAVIRUS 2 BY RT PCR (HOSPITAL ORDER, PERFORMED IN ~~LOC~~ HOSPITAL LAB): SARS Coronavirus 2: NEGATIVE

## 2019-06-04 LAB — MRSA PCR SCREENING: MRSA by PCR: NEGATIVE

## 2019-06-04 LAB — AMYLASE: Amylase: 44 U/L (ref 28–100)

## 2019-06-04 LAB — PHOSPHORUS: Phosphorus: 3.2 mg/dL (ref 2.5–4.6)

## 2019-06-04 LAB — PATHOLOGIST SMEAR REVIEW

## 2019-06-04 MED ORDER — ONDANSETRON HCL 4 MG/2ML IJ SOLN: 4.0000 mg | Freq: Four times a day (QID) | INTRAMUSCULAR | Status: DC | PRN

## 2019-06-04 MED ORDER — SODIUM CHLORIDE 0.9 % IV BOLUS
1000.0000 mL | Freq: Once | INTRAVENOUS | Status: AC
  Administered 2019-06-04: 05:00:00 1000 mL via INTRAVENOUS

## 2019-06-04 MED ORDER — PANTOPRAZOLE SODIUM 40 MG IV SOLR
40.0000 mg | Freq: Every day | INTRAVENOUS | Status: DC
  Administered 2019-06-04 – 2019-06-05 (×2): 40 mg via INTRAVENOUS
  Filled 2019-06-04 (×2): qty 40

## 2019-06-04 MED ORDER — AMIODARONE HCL IN DEXTROSE 360-4.14 MG/200ML-% IV SOLN: 30.0000 mg/h | INTRAVENOUS | Status: DC

## 2019-06-04 MED ORDER — ACETAMINOPHEN 650 MG RE SUPP
975.0000 mg | Freq: Once | RECTAL | Status: AC
  Administered 2019-06-04: 10:00:00 975 mg via RECTAL
  Filled 2019-06-04: qty 1

## 2019-06-04 MED ORDER — ALBUTEROL SULFATE (2.5 MG/3ML) 0.083% IN NEBU: 2.5000 mg | INHALATION_SOLUTION | RESPIRATORY_TRACT | Status: DC | PRN

## 2019-06-04 MED ORDER — ADENOSINE 6 MG/2ML IV SOLN
INTRAVENOUS | Status: AC
  Administered 2019-06-04: 20:00:00 6 mg via INTRAVENOUS
  Filled 2019-06-04: qty 2

## 2019-06-04 MED ORDER — VANCOMYCIN HCL IN DEXTROSE 1-5 GM/200ML-% IV SOLN: 1000.0000 mg | INTRAVENOUS | Status: DC

## 2019-06-04 MED ORDER — DEXMEDETOMIDINE HCL IN NACL 200 MCG/50ML IV SOLN
0.4000 ug/kg/h | INTRAVENOUS | Status: DC
  Administered 2019-06-04: 22:00:00 0.4 ug/kg/h via INTRAVENOUS

## 2019-06-04 MED ORDER — KETAMINE HCL 50 MG/5ML IJ SOSY
1.0000 mg/kg | PREFILLED_SYRINGE | Freq: Once | INTRAMUSCULAR | Status: AC
  Administered 2019-06-04: 09:00:00 70 mg via INTRAVENOUS
  Filled 2019-06-04: qty 10

## 2019-06-04 MED ORDER — SODIUM CHLORIDE 0.9 % IV SOLN
2.0000 g | Freq: Once | INTRAVENOUS | Status: AC
  Administered 2019-06-04: 07:00:00 2 g via INTRAVENOUS
  Filled 2019-06-04: qty 2

## 2019-06-04 MED ORDER — CHLORHEXIDINE GLUCONATE CLOTH 2 % EX PADS
6.0000 | MEDICATED_PAD | Freq: Every day | CUTANEOUS | Status: DC
  Administered 2019-06-04 – 2019-06-06 (×3): 6 via TOPICAL

## 2019-06-04 MED ORDER — SODIUM CHLORIDE 0.9 % IV SOLN
2.0000 g | INTRAVENOUS | Status: DC
  Administered 2019-06-05 – 2019-06-06 (×2): 2 g via INTRAVENOUS
  Filled 2019-06-04 (×3): qty 2

## 2019-06-04 MED ORDER — HEPARIN SODIUM (PORCINE) 5000 UNIT/ML IJ SOLN
5000.0000 [IU] | Freq: Three times a day (TID) | INTRAMUSCULAR | Status: DC
  Administered 2019-06-04 – 2019-06-06 (×4): 5000 [IU] via SUBCUTANEOUS
  Filled 2019-06-04 (×4): qty 1

## 2019-06-04 MED ORDER — ADENOSINE 6 MG/2ML IV SOLN
INTRAVENOUS | Status: AC
  Filled 2019-06-04: qty 4

## 2019-06-04 MED ORDER — ACETAMINOPHEN 325 MG PO TABS: 650.0000 mg | ORAL_TABLET | ORAL | Status: DC | PRN

## 2019-06-04 MED ORDER — SODIUM CHLORIDE 0.9 % IV BOLUS
1000.0000 mL | Freq: Once | INTRAVENOUS | Status: AC
  Administered 2019-06-04: 1000 mL via INTRAVENOUS

## 2019-06-04 MED ORDER — ORAL CARE MOUTH RINSE
15.0000 mL | Freq: Two times a day (BID) | OROMUCOSAL | Status: DC
  Administered 2019-06-04 (×2): 15 mL via OROMUCOSAL

## 2019-06-04 MED ORDER — ACETAMINOPHEN 650 MG RE SUPP
650.0000 mg | Freq: Once | RECTAL | Status: AC
  Administered 2019-06-04: 650 mg via RECTAL
  Filled 2019-06-04: qty 1

## 2019-06-04 MED ORDER — LACTATED RINGERS IV BOLUS
2000.0000 mL | Freq: Once | INTRAVENOUS | Status: AC
  Administered 2019-06-04: 12:00:00 2000 mL via INTRAVENOUS

## 2019-06-04 MED ORDER — SODIUM CHLORIDE 0.9 % IV SOLN
INTRAVENOUS | Status: DC
  Administered 2019-06-04 – 2019-06-05 (×3): via INTRAVENOUS

## 2019-06-04 MED ORDER — ACETAMINOPHEN 650 MG RE SUPP
650.0000 mg | RECTAL | Status: DC | PRN
  Filled 2019-06-04: qty 1

## 2019-06-04 MED ORDER — AMIODARONE HCL IN DEXTROSE 360-4.14 MG/200ML-% IV SOLN
60.0000 mg/h | INTRAVENOUS | Status: AC
  Administered 2019-06-04: 23:00:00 60 mg/h via INTRAVENOUS
  Filled 2019-06-04: qty 200

## 2019-06-04 MED ORDER — MORPHINE SULFATE (PF) 2 MG/ML IV SOLN
1.0000 mg | INTRAVENOUS | Status: DC | PRN
  Administered 2019-06-04: 2 mg via INTRAVENOUS
  Filled 2019-06-04: qty 1

## 2019-06-04 MED ORDER — AMIODARONE LOAD VIA INFUSION
150.0000 mg | Freq: Once | INTRAVENOUS | Status: AC
  Administered 2019-06-04: 150 mg via INTRAVENOUS
  Filled 2019-06-04: qty 83.34

## 2019-06-04 MED ORDER — ADENOSINE 6 MG/2ML IV SOLN
6.0000 mg | Freq: Once | INTRAVENOUS | Status: AC
  Administered 2019-06-04: 20:00:00 6 mg via INTRAVENOUS

## 2019-06-04 MED ORDER — LACTATED RINGERS IV BOLUS
250.0000 mL | Freq: Once | INTRAVENOUS | Status: AC
  Administered 2019-06-04: 250 mL via INTRAVENOUS

## 2019-06-04 MED ORDER — VANCOMYCIN HCL IN DEXTROSE 1-5 GM/200ML-% IV SOLN
1000.0000 mg | Freq: Once | INTRAVENOUS | Status: AC
  Administered 2019-06-04: 1000 mg via INTRAVENOUS
  Filled 2019-06-04: qty 200

## 2019-06-04 NOTE — ED Notes (Signed)
Date and time results received: 06/24/2019 0516 (use smartphrase ".now" to insert current time)  Test: WBC Critical Value: 87.9  Name of Provider Notified: molpus md  Orders Received? Or Actions Taken?: waiting on orders

## 2019-06-04 NOTE — ED Provider Notes (Signed)
[facility] DEPT Provider Note: Georgena Spurling, MD, FACEP  CSN: [id number] MRN: [id number] ARRIVAL: 05/31/2019 at [place]: WA11/WA11   CHIEF COMPLAINT  Altered Mental Status  Level 5 caveat: Altered mental status HISTORY OF PRESENT ILLNESS  06/10/2019 4:32 AM Brittany Rodgers is a 48 y.o. female with breast cancer.  She was brought by ambulance this morning after family reported confusion and decreased level of consciousness for several days; EMS was unable to get a clear timeline of events.  EMS reports patient has been nonverbal and only able to grunt and moan.  She was noted to have a temperature of 102.2 on arrival with tachycardia and tachypnea.    Past Medical History:  Diagnosis Date  . Cancer ([place])    BREAST CANCER RIGHT BREAST  . Chronic cough   . Family history of breast cancer   . GERD (gastroesophageal reflux disease)   . Hyperlipidemia   . IBS (irritable bowel syndrome)    sees Dr. Silvano Rusk   . Routine gynecological examination    sees Dr. Freda Munro    Past Surgical History:  Procedure Laterality Date  . CESAREAN SECTION    . IR PERC PLEURAL DRAIN W/INDWELL CATH W/IMG GUIDE  05/01/2019  . MASTECTOMY W/ SENTINEL NODE BIOPSY Right 03/04/2019   Procedure: RIGHT MASTECTOMY WITH RIGHT AXILLARY SENTINEL LYMPH NODE BIOPSY;  Surgeon: Rolm Bookbinder, MD;  Location: [facility];  Service: General;  Laterality: Right;  . PORTACATH PLACEMENT Right 11/14/2018   Procedure: INSERTION PORT-A-CATH WITH ULTRASOUND;  Surgeon: Rolm Bookbinder, MD;  Location: [facility];  Service: General;  Laterality: Right;  . ROOT CANAL      Family History  Problem Relation Age of Onset  . Hypertension Other   . Breast cancer Other        mat first cousin's daughter  . Breast cancer Maternal Aunt   . Hypertension Mother   . Breast cancer Maternal Aunt   . Breast cancer Maternal Aunt     Social History   Tobacco Use  . Smoking status: Never  Smoker  . Smokeless tobacco: Never Used  Substance Use Topics  . Alcohol use: No    Alcohol/week: 0.0 standard drinks  . Drug use: No    Prior to Admission medications   Medication Sig Start Date End Date Taking? Authorizing Provider  benzonatate (TESSALON) 100 MG capsule Take 1 capsule (100 mg total) by mouth 3 (three) times daily as needed for cough. 05/12/19  Yes Regalado, Belkys A, MD  docusate sodium (COLACE) 100 MG capsule Take 2 capsules (200 mg total) by mouth 2 (two) times daily. 05/12/19  Yes Regalado, Belkys A, MD  lidocaine-prilocaine (EMLA) cream Apply to affected area once 05/07/19  Yes Magrinat, [name], MD  metoprolol tartrate (LOPRESSOR) 50 MG tablet Take 1 tablet (50 mg total) by mouth 2 (two) times daily. 05/12/19  Yes Regalado, Belkys A, MD  morphine (MS CONTIN) 15 MG 12 hr tablet Take 1 tablet (15 mg total) by mouth every 12 (twelve) hours. 05/19/19  Yes Magrinat, [name], MD  morphine (MSIR) 15 MG tablet Take 1 tablet (15 mg total) by mouth every 4 (four) hours as needed for moderate pain. 05/19/19  Yes Magrinat, [name], MD  Omeprazole-Sodium Bicarbonate (ZEGERID) 20-1100 MG CAPS capsule Take 1 capsule by mouth daily before breakfast. 05/31/18  Yes [name], MD  polyethylene glycol (MIRALAX / GLYCOLAX) 17 g packet Take 17 g by mouth 2 (two)  times daily. 05/12/19  Yes Regalado, Belkys A, MD  prochlorperazine (COMPAZINE) 10 MG tablet Take 1 tablet (10 mg total) by mouth every 6 (six) hours as needed for nausea or vomiting. 05/13/19  Yes Magrinat, [name], MD  senna-docusate (SENOKOT-S) 8.6-50 MG tablet Take 2 tablets by mouth 2 (two) times daily. 05/12/19  Yes Regalado, Belkys A, MD    Allergies Barbiturates, Methocarbamol, and Sulfonamide derivatives   REVIEW OF SYSTEMS     PHYSICAL EXAMINATION  Initial Vital Signs Blood pressure 127/65, pulse (!) 125, temperature (!) 102.2 F (39 C), temperature source Rectal, resp. rate (!) 36, SpO2 95 %.  Examination  General: Well-developed, well-nourished female in no acute distress; appearance consistent with age of record HENT: normocephalic; atraumatic Eyes: pupils equal, round and reactive to light Neck: supple Heart: regular rate and rhythm; tachycardia Lungs: Diminished breath sounds right base Chest: Right mastectomy; right upper chest Port-A-Cath Abdomen: soft; nondistended; nontender; bowel sounds present Extremities: No deformity; full range of motion; pulses normal Neurologic: Awake, lethargic, minimally verbal; noted to move all extremities Skin: Warm and dry Psychiatric: Flat affect   RESULTS  Summary of this visit's results, reviewed by myself:   EKG Interpretation  Date/Time:    Ventricular Rate:    PR Interval:    QRS Duration:   QT Interval:    QTC Calculation:   R Axis:     Text Interpretation:        Laboratory Studies: Results for orders placed or performed during the hospital encounter of 05/30/2019 (from the past 24 hour(s))  Urinalysis, Routine w reflex microscopic     Status: Abnormal   Collection Time: 06/16/2019  4:40 AM  Result Value Ref Range   Color, Urine YELLOW YELLOW   APPearance HAZY (A) CLEAR   Specific Gravity, Urine 1.020 1.005 - 1.030   pH 6.0 5.0 - 8.0   Glucose, UA NEGATIVE NEGATIVE mg/dL   Hgb urine dipstick LARGE (A) NEGATIVE   Bilirubin Urine NEGATIVE NEGATIVE   Ketones, ur 5 (A) NEGATIVE mg/dL   Protein, ur >=300 (A) NEGATIVE mg/dL   Nitrite NEGATIVE NEGATIVE   Leukocytes,Ua NEGATIVE NEGATIVE   RBC / HPF 6-10 0 - 5 RBC/hpf   WBC, UA 6-10 0 - 5 WBC/hpf   Bacteria, UA MANY (A) NONE SEEN   Squamous Epithelial / LPF 0-5 0 - 5   Mucus PRESENT    Hyaline Casts, UA PRESENT   Comprehensive metabolic panel     Status: Abnormal   Collection Time: 05/31/2019  4:48 AM  Result Value Ref Range   Sodium 139 135 - 145 mmol/L   Potassium 4.7 3.5 - 5.1 mmol/L   Chloride 98 98 - 111 mmol/L   CO2 20 (L) 22 - 32 mmol/L   Glucose, Bld 120 (H) 70 - 99  mg/dL   BUN 39 (H) 6 - 20 mg/dL   Creatinine, Ser 2.66 (H) 0.44 - 1.00 mg/dL   Calcium 13.4 (HH) 8.9 - 10.3 mg/dL   Total Protein 6.7 6.5 - 8.1 g/dL   Albumin 3.0 (L) 3.5 - 5.0 g/dL   AST 148 (H) 15 - 41 U/L   ALT 107 (H) 0 - 44 U/L   Alkaline Phosphatase 241 (H) 38 - 126 U/L   Total Bilirubin 0.9 0.3 - 1.2 mg/dL   GFR calc non Af Amer 20 (L) >60 mL/min   GFR calc Af Amer 23 (L) >60 mL/min   Anion gap 21 (H) 5 - 15  Lactic acid,  plasma     Status: Abnormal   Collection Time: 05/30/2019  4:48 AM  Result Value Ref Range   Lactic Acid, Venous 7.3 (HH) 0.5 - 1.9 mmol/L  CBC with Differential     Status: Abnormal   Collection Time: 06/09/2019  4:48 AM  Result Value Ref Range   WBC 87.9 (HH) 4.0 - 10.5 K/uL   RBC 3.85 (L) 3.87 - 5.11 MIL/uL   Hemoglobin 8.9 (L) 12.0 - 15.0 g/dL   HCT 29.6 (L) 36.0 - 46.0 %   MCV 76.9 (L) 80.0 - 100.0 fL   MCH 23.1 (L) 26.0 - 34.0 pg   MCHC 30.1 30.0 - 36.0 g/dL   RDW 21.7 (H) 11.5 - 15.5 %   Platelets 396 150 - 400 K/uL   nRBC 3.3 (H) 0.0 - 0.2 %   Neutrophils Relative % 76 %   Neutro Abs 66.8 (H) 1.7 - 7.7 K/uL   Lymphocytes Relative 6 %   Lymphs Abs 5.3 (H) 0.7 - 4.0 K/uL   Monocytes Relative 9 %   Monocytes Absolute 7.9 (H) 0.1 - 1.0 K/uL   Eosinophils Relative 0 %   Eosinophils Absolute 0.0 0.0 - 0.5 K/uL   Basophils Relative 0 %   Basophils Absolute 0.0 0.0 - 0.1 K/uL   WBC Morphology      MODERATE LEFT SHIFT (>5% METAS AND MYELOS,OCC PRO NOTED)   RBC Morphology NRBC'S PRESENT    Metamyelocytes Relative 3 %   Myelocytes 6 %   Abs Immature Granulocytes 7.90 (H) 0.00 - 0.07 K/uL   Polychromasia PRESENT   Protime-INR     Status: Abnormal   Collection Time: 06/25/2019  4:48 AM  Result Value Ref Range   Prothrombin Time 16.8 (H) 11.4 - 15.2 seconds   INR 1.4 (H) 0.8 - 1.2   Imaging Studies: Ct Head Wo Contrast  Result Date: 05/30/2019 CLINICAL DATA:  Confusion and altered mental status for 2-3 days EXAM: CT HEAD WITHOUT CONTRAST TECHNIQUE:  Contiguous axial images were obtained from the base of the skull through the vertex without intravenous contrast. COMPARISON:  05/02/2019 FINDINGS: Brain: No evidence of acute infarction, hemorrhage, hydrocephalus, extra-axial collection or mass lesion/mass effect. Vascular: No hyperdense vessel or unexpected calcification. Skull: Normal. Negative for fracture or focal lesion. Sinuses/Orbits: No acute finding. IMPRESSION: Negative head CT. Electronically Signed   By: Monte Fantasia M.D.   On: 06/01/2019 05:33   Dg Chest Port 1 View  Result Date: 06/24/2019 CLINICAL DATA:  Fever and altered mental status EXAM: PORTABLE CHEST 1 VIEW COMPARISON:  05/10/2019 FINDINGS: Right-sided tunneled pleural catheter with tip at the apex. Right porta catheter with tip at the SVC. Both are in stable position. Chronic large right pleural effusion with underlying chest wall mass by chest CT. Most of the right lower lung is opacified or obscured. Left chest is clear. Normal heart size where not obscured. IMPRESSION: Chronic large right pleural effusion with tunneled catheter in place. Much of the right lower lung is obscured, which could limit detection of pneumonia. No acute finding when compared to priors. Electronically Signed   By: Monte Fantasia M.D.   On: 06/19/2019 05:26    ED COURSE and MDM  Nursing notes and initial vitals signs, including pulse oximetry, reviewed.  Vitals:   06/05/2019 0618 06/01/2019 0630 06/11/2019 0631 05/28/2019 0700  BP: 136/73 118/80  (!) 133/96  Pulse: (!) 133 (!) 125  (!) 125  Resp: (!) 53 (!) 52  (!) 48  Temp:  TempSrc:      SpO2: 94% 95%  95%  Weight:   70.3 kg   Height:   5\' 2"  (1.575 m)    4:41 AM Sepsis protocol initiated by nursing staff.  6:53 AM Maxipime ordered for likely infectious process.  Urinalysis pending.  Patient has an acute kidney injury, elevated lactic acid and profound leukocytosis.   7:34 AM Discussed with on-call hospitalist.  He would like a lumbar  puncture to evaluate for meningitis as the source has not yet been determined.  Patient was signed out to Dr. Eulis Foster.   PROCEDURES   CRITICAL CARE Performed by: Karen Chafe Arnett Duddy Total critical care time: 35 minutes Critical care time was exclusive of separately billable procedures and treating other patients. Critical care was necessary to treat or prevent imminent or life-threatening deterioration. Critical care was time spent personally by me on the following activities: development of treatment plan with patient and/or surrogate as well as nursing, discussions with consultants, evaluation of patient's response to treatment, examination of patient, obtaining history from patient or surrogate, ordering and performing treatments and interventions, ordering and review of laboratory studies, ordering and review of radiographic studies, pulse oximetry and re-evaluation of patient's condition.   ED DIAGNOSES     ICD-10-CM   1. SIRS (systemic inflammatory response syndrome) (HCC)  R65.10   2. AKI (acute kidney injury) ([facility])  N17.9   3. Leukocytosis, unspecified type  D72.829   4. Hypercalcemia  E83.52   5. Elevated liver enzymes  R74.8   6. Microcytic anemia  D50.9   7. Pleural effusion on right  J90   8. Delirium  R41.0        Canaan Holzer, Jenny Reichmann, MD 06/05/2019 2240

## 2019-06-04 NOTE — Progress Notes (Signed)
[facility] Progress Note Patient Name: Brittany Rodgers DOB: [birth year] MRN: [id number]   Date of Service  05/30/2019  HPI/Events of Note  Sinus tachycardia and agitation.  eICU Interventions  Precedex infusion ordered for sedation with the hopeful side effect of possibly lowering her heart rate if it is anxiety related.        [name] Brittany Rodgers 06/11/2019, 9:13 PM

## 2019-06-04 NOTE — Evaluation (Signed)
SLP Cancellation Note  Patient Details Name: Zakeria Kartchner MRN: [id number] DOB: 07/25/1971   Cancelled treatment:       Reason Eval/Treat Not Completed: Medical issues which prohibited therapy(pt not medically ready per RN, not responsive, will continue efforts)   Luanna Salk, [facility] SLP Acute Rehab Services Pager [phone] Office [phone]    Macario Golds 06/15/2019, 12:46 PM

## 2019-06-04 NOTE — ED Notes (Signed)
Critical at bedside.  

## 2019-06-04 NOTE — Progress Notes (Signed)
Brittany Rodgers was scheduled to see [name] in the clinic today but she ended up in the emergency room and was admitted.

## 2019-06-04 NOTE — Progress Notes (Signed)
Long discussion w/ sister at bedside. We discussed the current working medical dx of septic shock (unknown source) in context of worsening renal failure and progressive lactic acidosis as well as her underlying cancer and its progression in spite of therapy.   Based on this I offered the following and her sister [name] acknowledged this and was agreeable to these goals:  1) continue aggressive supportive medicine to include: antibiotics, IV fluids, diagnostic tests such as CT imaging and even administration of vasoactive gtts to maintain blood pressure 2) DNR and DNI should she arrest.   [name] remains hopeful that she can survive this illness but understands that her sister's body will likely declare how she does in the next 12-24 hrs.   30 minutes at bedside.   Erick Colace ACNP-BC [place] Pager # [phone] OR # [phone] if no answer

## 2019-06-04 NOTE — Progress Notes (Addendum)
Pharmacy Antibiotic Note  Brittany Rodgers is a 48 y.o. female admitted on 06/14/2019 with sepsis.  Pharmacy has been consulted for Vancomycin & Cefepime dosing.  Plan: Cefepime 2gm q24 Vancomycin 1gm x1 in ED, followed by 1gm q48hr, AUC 466, SCr 2.66 LP in ED  Height: 5\' 2"  (157.5 cm) Weight: 154 lb 14.4 oz (70.3 kg) IBW/kg (Calculated) : 50.1  Temp (24hrs), Avg:101.8 F (38.8 C), Min:101.3 F (38.5 C), Max:102.2 F (39 C)  Recent Labs  Lab 05/29/19 1512 06/18/2019 0448 06/23/2019 0957  WBC 28.1* 87.9*  --   CREATININE 1.03* 2.66*  --   LATICACIDVEN  --  7.3* 8.3*    Estimated Creatinine Clearance: 23.5 mL/min (A) (by C-G formula based on SCr of 2.66 mg/dL (H)).    Allergies  Allergen Reactions  . Barbiturates Other (See Comments)    Keeps pt awake  . Methocarbamol Itching  . Sulfonamide Derivatives Itching   Antimicrobials this admission: 9/9 Vancomycin >>  9/9 Zosyn >>   Dose adjustments this admission:  Microbiology results: 9/9 BCx: sent 9/9 UCx: sent  9/9 MRSA PCR: sent 9/9 CSF: LP in ED 9/9 Pleural Fluid: sent  Prev Cx 8/17 Pleural fluid: ng, no organisms on gram stain  Thank you for allowing pharmacy to be a part of this patient's care.  Minda Ditto 06/13/2019 11:35 AM

## 2019-06-04 NOTE — Progress Notes (Signed)
48 year old African-American female known triple negative breast cancer diagnosed 10/2018-has received carboplatin gemcitabine Known large right pleural effusion status post Pleurx? Altered mental status for past several days prior to coming to ED 06/23/2019-tachycardic 120s tachypneic, code sepsis called-T-max 102.2 given 2 L in the emergency room family unaware of aggravating relieving factors not medically sophisticated-  ED work-up showed BUN/creatinine 39/2.66 above baseline, metabolic acidosis CO2 20 AST 140 ALT 107 total protein 67 total bili 0.9  Lactic acid 7.4, INR 1.4 (not on any anticoagulation)  CXR = large right pleural effusion  CT head negative  I have discussed with Dr. Florina Ou in the emergency room who will try to obtain an LP-lactic to be cycled-critical care should be consulted if patient loses blood pressure or becomes unresponsive May be beneficial to get a blood gas  ABG shows significant hypoxic respiratory failure-in addition I examined the patient personally at the bedside she is significantly tachypneic altered mentally and is unable to verbalize although she can follow simple commands I would estimate her GCS is about 12  I do not think she would be best served on our service as I think she may need intubation given her pleural effusion tachypnea tachycardia and frank sepsis  I have spoken with Dr. Chase Caller of critical care medicine who will see the patient in consult  Note that the coronavirus testing is pending at this time and nursing is about to obtain the same  Care relinquished at this time-please contact me directly if you need my services  Verneita Griffes, MD [facility] 8:02 AM

## 2019-06-04 NOTE — ED Notes (Signed)
LR 254ml bolus initiated verbal per Collier Salina with Critical Care

## 2019-06-04 NOTE — Progress Notes (Signed)
[facility] Progress Note Patient Name: Brittany Rodgers DOB: August 06, 1971 MRN: [id number]   Date of Service  06/24/2019  HPI/Events of Note  Pt  Went into SVT with a VRR of 220 but blood pressure 200/114. Pt denied chest pain.  eICU Interventions  Adenosine 6 mg iv x 1 given with termination of the SVT. Pt back to baseline rhythm os sinus tachycardia.        [name] Malin Cervini 06/25/2019, 8:40 PM

## 2019-06-04 NOTE — H&P (Addendum)
NAME:  Brittany Rodgers, MRN:  [id number], DOB:  17-Dec-1970, LOS: 0 ADMISSION DATE:  06/03/2019, CONSULTATION DATE:  06/09/2019 REFERRING MD:   CHIEF COMPLAINT:  AMS  Brief History   Brittany Rodgers is a 48yo African American F with history significant for metastatic triple negative breast cancer with lung mets who presented to the ED due to complaints of AMS for over the last 2-3 days and was found to meet criteria for sepsis and elevated lactate  on admssion. PCCM consulted and admitted.   History of present illness   48yo F presented with AMS for the past 2-3 days. History primarily obtained from chart review, nursing report, and from family as patient is currently still confused. Per patients sister patient has been experiencing worsening confusion over the last 2-3 days with progressive worsening over the last 24hrs. Per sister no fever, cough, chest pain, ABD pain, urinary symptoms or lower extremity swelling prior to admission. Sister reports PleurX cath last drained 2109mls light cranberry plural fluid on 06/02/2019 with no signs of infection. PleurX cath originally placed 05/01/2019.   On admission patient was found to be febrile with a T-max of 102.2, tachycardic with a HR of 125, Tachyphenic with a RR of 36, B/P within normal limits. Lab work significant for: WBC 87.9, Hgb 8.9, Hct 29.6, CO2 20, BUN 39, Creatinine 2.66, Lactic acid 7.3. ABG: 7.39/29/67.5/17.6. Chest xray revealed chronic large right pleural effusion with much of the right lower lobed obscured, unable to rule out pneumonia. Head CT negative for acute findings. PCCM consulted and patient admitted to ICU.   Past Medical History  Metastatic breast cancer with lung mets  Anemia GERD Chronic pleural effusion S/P mastectomy  IBS Hyperlipidemia  Hypertension   Significant Hospital Events   9/9 Admitted   Consults:  PCCM  Oncology   Procedures:  Attempted LP in ED 9/9 >> unsuccessful   Significant Diagnostic Tests:  Chest  xray 9/9 >> revealed chronic large right pleural effusion with much of the right lower lobed obscured, unable to rule out pneumonia. Head CT 9/9 >> negative for acute findings  Micro Data:  COVID 9/9 >> neg PleurX culture 9/9 >> BLood culture 9/9 >> Urine culture 9/9 >> CSF culture 9/9 >>  Antimicrobials:  Cefepime 9/9 >> Vancomycin 9/9 >>  Interim history/subjective:  47yo F febrile with a T-max of 102.2, Alert but unable to answer any orientation questions RN reports little improvement in mentation or appearance with fluid boluses. Currently sating 99 on room air with RR of 35.   Objective   Blood pressure 138/86, pulse (!) 113, temperature (!) 101.3 F (38.5 C), temperature source Rectal, resp. rate (!) 35, height 5\' 2"  (1.575 m), weight 70.3 kg, SpO2 95 %.        Intake/Output Summary (Last 24 hours) at 06/05/2019 0912 Last data filed at 06/18/2019 0834 Gross per 24 hour  Intake 2099.79 ml  Output -  Net 2099.79 ml   Filed Weights   05/28/2019 0631  Weight: 70.3 kg    Examination: General: Alert but confused adult female, appears toxic with mild respiratory demand. HENT: sclera non-icteric, extraocular movements intact. No nuchal rigidity  Lungs: very diminished breath sounds on right, clear on left, increased work of breathing with mild accessory muscle use seen Cardiovascular: s1s2, regular rate and rhythm, tachycardia  Abdomen: soft, mildly tender to palpation of lower ABD, active bowel sounds Extremities: warm and dry Neuro: alert, unable to assess orientation GU: foley in place  Resolved Hospital Problem list     Assessment & Plan:  Sever Sepsis / Septic Shock as evidence by rising lactic acid and signs of end organ damage including acute AKI and AMS, source unclear -in the setting of chronic right pleural effusion, metastatic breast cancer with lung mets, and presence of port-a-cath and PleurX cath. Sepsis criteria as follows: Febrile with a T-max of 102.2, RR of  38, HR of 125, AMS, WBC 87.9, with suspected infection. LP attempted in ED, unsuccessful. No visible signs of infection seen at line sites.  P: Aggressive IVF resuscitation Follow WBC and lactic acid  Follow culture results including; blood, urine, and Pleural fluid   Continue broad spectrum ABT Hold home beta blocker   Acute Metabolic Encephalopathy  -In the setting of sepsis and metastatic breast cancer. Head CT negative on admission. At baseline patient is completely alert and oriented. No nuchal rigidity on exam  P: Treat acute infection as above If mentation doesn't improve consider obtaining MRI brain to rule out mets  Follow delirium protocol  Consult Radiology for re-attempt at LP  Repeat ABG  Acute Kidney Injury  -in the setting of sepsis and organ hypoperfusion  P: Aggressive IV hydration Monitor urine function and output closely Monitor labs daily Consider renal ultrasound if improvement not seen with IV hydration  Ensure renal perfusion, avoid nephrotoxins   Metastatic Breast Cancer -Followed by [name]. Status post right mastectomy, started on carboplatin and gemcitabine on [id number] with no complications last dose 0000000. Malignant pleural effusion cytology positive 04/24/2019 with placement of PleurX cath 05/01/2019 P: Drain and obtain pleural fluid culture  Oncology will be made aware of admission Treatment per oncology  Hold home opoid's until mentation improves   Profound leukocytosis  -in the setting of metastatic breast cancer WBC count 87.9 on admission. Previous WBC 28.1 on 9/3 and 2.9 on 8/24. Patient received Neulasta on 05/19/2019 P: Follow with daily labs Oncology made aware   Best practice:  Diet: NPO, obtain speech eval  Pain/Anxiety/Delirium protocol (if indicated): will resume home opoid's once mentation impoves VAP protocol (if indicated): N/A DVT prophylaxis: Subq hep GI prophylaxis: PPI Glucose control: N/A Mobility: BR Code  Status: Full, verified with sister over the phone  Family Communication: Updated patiens sister, Brittany Rodgers, over the phone Disposition:   Labs   CBC: Recent Labs  Lab 05/29/19 1512 06/19/2019 0448  WBC 28.1* 87.9*  NEUTROABS 18.0* 66.8*  HGB 8.8* 8.9*  HCT 28.1* 29.6*  MCV 75.1* 76.9*  PLT 42* [id number]    Basic Metabolic Panel: Recent Labs  Lab 05/29/19 1512 06/08/2019 0448  NA 132* 139  K 4.4 4.7  CL 95* 98  CO2 24 20*  GLUCOSE 94 120*  BUN 24* 39*  CREATININE 1.03* 2.66*  CALCIUM 11.7* 13.4*   GFR: Estimated Creatinine Clearance: 23.5 mL/min (A) (by C-G formula based on SCr of 2.66 mg/dL (H)). Recent Labs  Lab 05/29/19 1512 06/08/2019 0448  WBC 28.1* 87.9*  LATICACIDVEN  --  7.3*    Liver Function Tests: Recent Labs  Lab 05/29/19 1512 06/18/2019 0448  AST 9* 148*  ALT 23 107*  ALKPHOS 224* 241*  BILITOT 0.4 0.9  PROT 6.2* 6.7  ALBUMIN 2.8* 3.0*   No results for input(s): LIPASE, AMYLASE in the last 168 hours. No results for input(s): AMMONIA in the last 168 hours.  ABG    Component Value Date/Time   PHART 7.399 05/28/2019 0728   PCO2ART 29.0 (L) 06/20/2019 [id number]  PO2ART 67.5 (L) 06/14/2019 0728   HCO3 17.6 (L) 06/11/2019 0728   TCO2 27 04/11/2019 2117   ACIDBASEDEF 6.1 (H) 06/12/2019 0728   O2SAT 89.6 06/18/2019 0728     Coagulation Profile: Recent Labs  Lab 06/13/2019 0448  INR 1.4*    Cardiac Enzymes: No results for input(s): CKTOTAL, CKMB, CKMBINDEX, TROPONINI in the last 168 hours.  HbA1C: Hgb A1c MFr Bld  Date/Time Value Ref Range Status  05/17/2010 09:50 PM  <5.7 % Final   5.4 (NOTE)                                                                       According to the ADA Clinical Practice Recommendations for 2011, when HbA1c is used as a screening test:   >=6.5%   Diagnostic of Diabetes Mellitus           (if abnormal result  is confirmed)  5.7-6.4%   Increased risk of developing Diabetes Mellitus  References:Diagnosis and Classification  of Diabetes Mellitus,Diabetes [id number] 1):S62-S69 and Standards of Medical Care in         Diabetes - 2011,Diabetes [id number]  (Suppl 1):S11-S61.    CBG: No results for input(s): GLUCAP in the last 168 hours.  Review of Systems:   Unable to obtain due to patients AMS, reviewed with sister with, Per sister no fever, cough, chest pain, ABD pain, urinary symptoms or lower extremity swelling prior to admission.  Past Medical History  She,  has a past medical history of Cancer ([place]), Chronic cough, Family history of breast cancer, GERD (gastroesophageal reflux disease), Hyperlipidemia, IBS (irritable bowel syndrome), and Routine gynecological examination.   Surgical History    Past Surgical History:  Procedure Laterality Date  . CESAREAN SECTION    . IR PERC PLEURAL DRAIN W/INDWELL CATH W/IMG GUIDE  05/01/2019  . MASTECTOMY W/ SENTINEL NODE BIOPSY Right 03/04/2019   Procedure: RIGHT MASTECTOMY WITH RIGHT AXILLARY SENTINEL LYMPH NODE BIOPSY;  Surgeon: Rolm Bookbinder, MD;  Location: [facility];  Service: General;  Laterality: Right;  . PORTACATH PLACEMENT Right 11/14/2018   Procedure: INSERTION PORT-A-CATH WITH ULTRASOUND;  Surgeon: Rolm Bookbinder, MD;  Location: [facility];  Service: General;  Laterality: Right;  . ROOT CANAL      Social History   reports that she has never smoked. She has never used smokeless tobacco. She reports that she does not drink alcohol or use drugs.   Family History   Her family history includes Breast cancer in her maternal aunt, maternal aunt, maternal aunt and another family member; Hypertension in her mother and another family member.   Allergies Allergies  Allergen Reactions  . Barbiturates Other (See Comments)    Keeps pt awake  . Methocarbamol Itching  . Sulfonamide Derivatives Itching     Home Medications  Prior to Admission medications   Medication Sig Start Date End Date Taking? Authorizing Provider   benzonatate (TESSALON) 100 MG capsule Take 1 capsule (100 mg total) by mouth 3 (three) times daily as needed for cough. 05/12/19  Yes Regalado, Belkys A, MD  docusate sodium (COLACE) 100 MG capsule Take 2 capsules (200 mg total) by mouth 2 (two) times daily. 05/12/19  Yes Regalado, Cassie Freer, MD  lidocaine-prilocaine (EMLA) cream Apply to affected area once 05/07/19  Yes Magrinat, [name], MD  metoprolol tartrate (LOPRESSOR) 50 MG tablet Take 1 tablet (50 mg total) by mouth 2 (two) times daily. 05/12/19  Yes Regalado, Belkys A, MD  morphine (MS CONTIN) 15 MG 12 hr tablet Take 1 tablet (15 mg total) by mouth every 12 (twelve) hours. 05/19/19  Yes Magrinat, [name], MD  morphine (MSIR) 15 MG tablet Take 1 tablet (15 mg total) by mouth every 4 (four) hours as needed for moderate pain. 05/19/19  Yes Magrinat, [name], MD  Omeprazole-Sodium Bicarbonate (ZEGERID) 20-1100 MG CAPS capsule Take 1 capsule by mouth daily before breakfast. 05/31/18  Yes [name], MD  polyethylene glycol (MIRALAX / GLYCOLAX) 17 g packet Take 17 g by mouth 2 (two) times daily. 05/12/19  Yes Regalado, Belkys A, MD  prochlorperazine (COMPAZINE) 10 MG tablet Take 1 tablet (10 mg total) by mouth every 6 (six) hours as needed for nausea or vomiting. 05/13/19  Yes Magrinat, [name], MD  senna-docusate (SENOKOT-S) 8.6-50 MG tablet Take 2 tablets by mouth 2 (two) times daily. 05/12/19  Yes Regalado, Cassie Freer, MD    Critical care time:     CRITICAL CARE Performed by: Johnsie Cancel   Total critical care time: 45 minutes  Critical care time was exclusive of separately billable procedures and treating other patients.  Critical care was necessary to treat or prevent imminent or life-threatening deterioration.  Critical care was time spent personally by me on the following activities: development of treatment plan with patient and/or surrogate as well as nursing, discussions with consultants, evaluation of patient's response to  treatment, examination of patient, obtaining history from patient or surrogate, ordering and performing treatments and interventions, ordering and review of laboratory studies, ordering and review of radiographic studies, pulse oximetry and re-evaluation of patient's condition.  Johnsie Cancel, NP-C [facility] Pgr: [phone] or if no answer [phone] 06/12/2019

## 2019-06-04 NOTE — Progress Notes (Addendum)
HEMATOLOGY-ONCOLOGY PROGRESS NOTE  SUBJECTIVE: Brittany Rodgers presented to the emergency room with altered mental status for the past 2 to 3 days.  Family reported worsening confusion over the past 2 to 3 days with progressive worsening in the past 24 hours.  She was found to have a fever of 102.2, was tachycardic, and tachypneic.  Lab work showed a white blood cell count 87.9, hemoglobin 8.9 hematocrit 29.6, CO2 20, BUN 39, creatinine 2.66, lactic acid 7.3.  Chest x-ray showed a chronic large right pleural effusion with much of the right lower lobe obscured and unable to rule out pneumonia.  Head CT was negative.  LP attempted in the ER but was unsuccessful.  From a breast cancer standpoint, the patient has been receiving chemotherapy with carboplatin and Gemzar.  The patient received her last chemotherapy on 05/19/2019.  OnPro was applied on 05/19/2019 and would have auto injected on 05/22/2019.   The patient's sister was at the bedside at the time of my visit.  The patient sister states that she has been lethargic and having fatigue for the past several days.  She has not been eating or drinking.  They have not noticed any fevers or chills at home.  The patient sister has been draining her Pleurx catheter every other day with about 240-250 cc of cranberry colored fluid removed.  Report the patient did not complain of any chest discomfort, shortness of breath, or cough.  The patient is lethargic and unable to participate in our discussion.  Oncology History  Malignant neoplasm of lower-outer quadrant of right breast of female, estrogen receptor negative ([place])  11/04/2018 Initial Diagnosis   Malignant neoplasm of lower-outer quadrant of right breast of female, estrogen receptor negative ([place])   11/26/2018 - 02/24/2019 Chemotherapy   The patient had DOXOrubicin (ADRIAMYCIN) chemo injection 106 mg, 60 mg/m2 = 106 mg, Intravenous,  Once, 4 of 4 cycles Administration: 106 mg (11/26/2018), 106 mg (12/10/2018), 106 mg  (12/24/2018), 106 mg (01/07/2019) palonosetron (ALOXI) injection 0.25 mg, 0.25 mg, Intravenous,  Once, 6 of 10 cycles Administration: 0.25 mg (11/26/2018), 0.25 mg (01/28/2019), 0.25 mg (12/10/2018), 0.25 mg (12/24/2018), 0.25 mg (01/07/2019), 0.25 mg (02/11/2019), 0.25 mg (02/04/2019), 0.25 mg (02/18/2019) pegfilgrastim-cbqv (UDENYCA) injection 6 mg, 6 mg, Subcutaneous, Once, 4 of 4 cycles Administration: 6 mg (12/12/2018), 6 mg (12/26/2018), 6 mg (01/09/2019) CARBOplatin (PARAPLATIN) 240 mg in sodium chloride 0.9 % 250 mL chemo infusion, 240 mg (99 % of original dose 242.4 mg), Intravenous,  Once, 2 of 6 cycles Dose modification:   (original dose 242.4 mg, Cycle 5), 240 mg (original dose 242.4 mg, Cycle 5),   (original dose 242.4 mg, Cycle 6, Reason: Provider Judgment) Administration: 240 mg (01/28/2019), 240 mg (02/11/2019), 240 mg (02/04/2019), 240 mg (02/18/2019) cyclophosphamide (CYTOXAN) 1,060 mg in sodium chloride 0.9 % 250 mL chemo infusion, 600 mg/m2 = 1,060 mg, Intravenous,  Once, 4 of 4 cycles Administration: 1,060 mg (11/26/2018), 1,060 mg (12/10/2018), 1,060 mg (12/24/2018), 1,060 mg (01/07/2019) PACLitaxel (TAXOL) 144 mg in sodium chloride 0.9 % 250 mL chemo infusion (</= '80mg'$ /m2), 80 mg/m2 = 144 mg, Intravenous,  Once, 2 of 6 cycles Administration: 144 mg (01/28/2019), 144 mg (02/11/2019), 144 mg (02/04/2019), 144 mg (02/18/2019) fosaprepitant (EMEND) 150 mg, dexamethasone (DECADRON) 12 mg in sodium chloride 0.9 % 145 mL IVPB, , Intravenous,  Once, 5 of 5 cycles Administration:  (11/26/2018),  (01/28/2019),  (12/10/2018),  (12/24/2018),  (01/07/2019)  for chemotherapy treatment.    04/02/2019 -  Chemotherapy   The patient had  pembrolizumab (KEYTRUDA) 200 mg in sodium chloride 0.9 % 50 mL chemo infusion, 200 mg, Intravenous, Once, 2 of 6 cycles Administration: 200 mg (04/02/2019), 200 mg (05/13/2019)  for chemotherapy treatment.    05/13/2019 -  Chemotherapy   The patient had palonosetron (ALOXI) injection 0.25 mg, 0.25  mg, Intravenous,  Once, 1 of 4 cycles Administration: 0.25 mg (05/13/2019), 0.25 mg (05/19/2019) pegfilgrastim (NEULASTA ONPRO KIT) injection 6 mg, 6 mg, Subcutaneous, Once, 1 of 4 cycles Administration: 6 mg (05/19/2019) CARBOplatin (PARAPLATIN) 190 mg in sodium chloride 0.9 % 250 mL chemo infusion, 240 mg (100 % of original dose 243.8 mg), Intravenous,  Once, 1 of 4 cycles Dose modification:   (original dose 243.8 mg, Cycle 1) Administration: 190 mg (05/13/2019), 190 mg (05/19/2019) gemcitabine (GEMZAR) 1,862 mg in sodium chloride 0.9 % 250 mL chemo infusion, 1,000 mg/m2 = 1,862 mg, Intravenous,  Once, 1 of 4 cycles Administration: 1,862 mg (05/13/2019), 1,862 mg (05/19/2019)  for chemotherapy treatment.       REVIEW OF SYSTEMS:   Unable to obtain review of systems secondary to patient condition.  I have reviewed the past medical history, past surgical history, social history and family history with the patient and they are unchanged from previous note.   PHYSICAL EXAMINATION: ECOG PERFORMANCE STATUS: 3 - Symptomatic, >50% confined to bed  Vitals:   06/21/2019 1121 06/09/2019 1144  BP: (!) 145/96   Pulse: (!) 109   Resp: (!) 39   Temp:  (P) 98.1 F (36.7 C)  SpO2: 100%    Filed Weights   06/23/2019 0631  Weight: 154 lb 14.4 oz (70.3 kg)    Intake/Output from previous day: 09/08 0701 - 09/09 0700 In: 1000 [IV Piggyback:1000] Out: -   GENERAL: Lethargic, tachypneic.  Moans with sternal rub. SKIN: skin color, texture, turgor are normal, no rashes or significant lesions EYES: normal, Conjunctiva are pink and non-injected, sclera clear LUNGS: Diminished breath sounds on right.  Tachypneic. HEART: r tachycardic, no murmurs.  No lower extremity edema. ABDOMEN:abdomen soft, non-tender and normal bowel sounds Musculoskeletal:no cyanosis of digits and no clubbing  NEURO: alert & oriented x 3 with fluent speech, no focal motor/sensory deficits  LABORATORY DATA:  I have reviewed the data  as listed CMP Latest Ref Rng & Units 06/03/2019 05/29/2019 05/19/2019  Glucose 70 - 99 mg/dL 120(H) 94 108(H)  BUN 6 - 20 mg/dL 39(H) 24(H) 23(H)  Creatinine 0.44 - 1.00 mg/dL 2.66(H) 1.03(H) 1.17(H)  Sodium 135 - 145 mmol/L 139 132(L) 136  Potassium 3.5 - 5.1 mmol/L 4.7 4.4 4.0  Chloride 98 - 111 mmol/L 98 95(L) 97(L)  CO2 22 - 32 mmol/L 20(L) 24 23  Calcium 8.9 - 10.3 mg/dL 13.4(HH) 11.7(H) 10.4(H)  Total Protein 6.5 - 8.1 g/dL 6.7 6.2(L) 7.4  Total Bilirubin 0.3 - 1.2 mg/dL 0.9 0.4 0.4  Alkaline Phos 38 - 126 U/L 241(H) 224(H) 167(H)  AST 15 - 41 U/L 148(H) 9(L) 30  ALT 0 - 44 U/L 107(H) 23 96(H)    Lab Results  Component Value Date   WBC 87.9 (HH) 06/24/2019   HGB 8.9 (L) 06/07/2019   HCT 29.6 (L) 06/23/2019   MCV 76.9 (L) 06/21/2019   PLT 396 06/20/2019   NEUTROABS 66.8 (H) 06/14/2019    Ct Head Wo Contrast  Result Date: 06/19/2019 CLINICAL DATA:  Confusion and altered mental status for 2-3 days EXAM: CT HEAD WITHOUT CONTRAST TECHNIQUE: Contiguous axial images were obtained from the base of the skull through the  vertex without intravenous contrast. COMPARISON:  05/02/2019 FINDINGS: Brain: No evidence of acute infarction, hemorrhage, hydrocephalus, extra-axial collection or mass lesion/mass effect. Vascular: No hyperdense vessel or unexpected calcification. Skull: Normal. Negative for fracture or focal lesion. Sinuses/Orbits: No acute finding. IMPRESSION: Negative head CT. Electronically Signed   By: Monte Fantasia M.D.   On: 06/02/2019 05:33   Dg Chest Port 1 View  Result Date: 06/18/2019 CLINICAL DATA:  Fever and altered mental status EXAM: PORTABLE CHEST 1 VIEW COMPARISON:  05/10/2019 FINDINGS: Right-sided tunneled pleural catheter with tip at the apex. Right porta catheter with tip at the SVC. Both are in stable position. Chronic large right pleural effusion with underlying chest wall mass by chest CT. Most of the right lower lung is opacified or obscured. Left chest is clear.  Normal heart size where not obscured. IMPRESSION: Chronic large right pleural effusion with tunneled catheter in place. Much of the right lower lung is obscured, which could limit detection of pneumonia. No acute finding when compared to priors. Electronically Signed   By: Monte Fantasia M.D.   On: 06/11/2019 05:26   Dg Chest Port 1 View  Result Date: 05/10/2019 CLINICAL DATA:  Shortness of breath. EXAM: PORTABLE CHEST 1 VIEW COMPARISON:  Radiograph 05/05/2019, additional priors. FINDINGS: Accessed right chest port remains in place. Right PleurX catheter tip at the apex. Large pleural effusion has decreased from prior exam with some improving aeration of the right lung apex. Similar leftward mediastinal shift to before. Left lung is grossly clear. No pneumothorax or other change from prior exam. IMPRESSION: Right PleurX catheter in place with persistent but diminishing large right pleural effusion. Improving right apical aeration. Otherwise unchanged exam. Electronically Signed   By: Keith Rake M.D.   On: 05/10/2019 22:22    ASSESSMENT AND PLAN: 48 y.o. [place], [place] woman status post right breast lower outer quadrant biopsy 10/29/2018 for a clinical T2 N0, stage IIB invasive ductal carcinoma, grade 3, triple negative, with an MIB-1 of 80%             (a) CT of the chest 11/15/2018 shows possible liver metastases, but liver biopsy 11/25/2018 consistent with hemangioma             (b) bone scan 11/15/2018 shows no suspicious bone lesions  (1) neoadjuvant chemotherapy consisting of doxorubicin and cyclophosphamide in dose dense fashion x4, started 11/26/2018, completed 01/07/2019, followed by weekly paclitaxel and carboplatin x12 starting 01/28/2019  (2) patient desires ovarian function preservation:              (a) Zoladex started 11/08/2018, discontinued 02/04/2019             (b) patient is status post bilateral tubal ligation  (3)status post right mastectomy with sentinel lymph node  sampling 03/04/2019 for aypTac ypN1ainvasive ductal carcinoma grade 3, repeat prognostic panel again triple negative (a)a total of 6 axillary lymph nodes removed, 2 sentinel ones, 1positive  (4) adjuvant radiationto be considered  (5) genetics counseling on 11/28/2018, testing was recommended, but declined by patient.  METASTATIC DISEASE: July 2020 (6)Staging studies: (a) chest CT scan and bone scan 11/15/2018 show a liver mass, no lung or bone lesions (b) liver biopsy 11/25/2018 shows a hemangioma (c)chest CT Angio 03/26/2019 again shows liver mass, right breast "seroma" (d) cytology from R thoracentesis 04/12/2019 nondiagnostic (e) CT angio 04/23/2019 shows recurrent right effusion, multiple bilateral pulmonary nodules, invasive anterior right chest wall lesion             (  f) biopsy of the chest wall lesion on 04/24/2019 and cytology from the right pleural effusion the same day confirms metastatic breast cancer             (g) CARIS study results pending  (7) pembrolizumabstarted 04/02/2019, to be repeated every 21 days  (8) carboplatin and gemcitabinestarted 05/13/2019, to be repeated days 1 and 8 of each 21-day cycle  (9) pain control:             (a) currently on MS Contin 56m twice daily with MSIR 15 mg as needed             (b) so far no need for bowel prophylaxis  (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  Source of infection is unclear.  Blood, urine, and pleural fluid cultures have been sent.  She has been started on broad-spectrum antibiotics.  Await culture results.  CT of the head was negative.  LP unsuccessful in the ER.  Consider MRI of the brain if  mentation does not improve. Continue IV fluids for acute kidney injury.  The patient has significant leukocytosis on admission.  Suspect this is a combination of acute infection in conjunction with Neulasta.  Continue to follow white blood cell count.  Hopefully, this will improve with treatment of her acute infection.  The patient was due for her next cycle of chemotherapy today.  We will need to hold this.  I discussed this with the patient sister who is at the bedside and she states understanding.  Appreciate assistance of PCCM.  We will continue to follow along with you.    LOS: 0 days   KMikey Bussing DNP, AGPCNP-BC, AOCNP 06/12/2019   ADDENDUM: Brittany Rodgers's prognosis from a breast cancer point of view is poor. Her breast cancer grew through first line therapies (while receiving doxorubicin and cyclophosphamide). Her cancer is triple negative so there is no role for "easier" treatments like anti-estrogens. She has metastatic disease, which is not curable. Given this situation, I favor a limited DNR order (no intubation or CPR) while trying to reverse her current sepsis.   I will discuss this with the family, and with the patient when more alert, but it is not clear who holds HCPOA at this time.  I appreciate your efforts on behalf of this young woman and her family  GChauncey Cruel MD Medical Oncology and Hematology [facility] [place] [id number]. [phone]   Fax. [phone]

## 2019-06-04 NOTE — Progress Notes (Signed)
Port site assessment: per RN request after pt was re-accessed in IR. Site clean and dry. Reinforced slight tenting at distal end of dressing. RN made aware that site looks good.

## 2019-06-04 NOTE — Progress Notes (Signed)
A consult was received from an ED physician for vancomycin per pharmacy dosing.  The patient's profile has been reviewed for ht/wt/allergies/indication/available labs.    A one time order has been placed for vancomycin 1000 mg IV x1.  Further antibiotics/pharmacy consults should be ordered by admitting physician if indicated.                       Thank you, Lynelle Doctor 06/24/2019  7:26 AM

## 2019-06-04 NOTE — Progress Notes (Signed)
eLink Physician-Brief Progress Note Patient Name: Brittany Rodgers DOB: [birth year] MRN: [id number]   Date of Service  05/30/2019  HPI/Events of Note  Recurrence of SVT along with progressive decompensation of respiratory status.  eICU Interventions  Bipap ordered,  Amiodarone 150 mg bolus followed by infusion ordered, RN instructed to drain pleurex catheter, prn Morphine for analgesia and comfort ordered, RN instructed to invite family in as pt may be transitioning to an end of life situation. She is DNI/DNAR.        [name] 06/17/2019, 11:19 PM

## 2019-06-04 NOTE — ED Triage Notes (Addendum)
Patient coming from home with complaints of gradually increasing confusion and altered mental status starting about 2-3 days ago. Family member was poor historian for EMS and is unable to recall how long patient has had confusion, stating "its been a few days." Patient is non verbal with EMS and is only able to grunt and moan.  Family states that patient is A&O x4 at baseline and can verbally communicate.

## 2019-06-04 NOTE — ED Notes (Signed)
Patient transported to CT 

## 2019-06-04 NOTE — ED Provider Notes (Signed)
7:15 AM-checkout from Dr. Florina Ou to evaluate for possible meningitis as a source for fever, elevated white blood cell count and altered mental status.  She received granulocyte stimulating factor, pegfilgrastim, on 05/19/2019.  Head CT is negative.  She is not on anticoagulants.  She is chronically debilitated.  She is actively receiving chemotherapy for breast cancer.  She has a pleural catheter for drainage of malignant pleural effusion.  She was due to have additional chemotherapy, today.  INR is 1.4 today.  At this time the patient is alert, but minimally responsive.  She localizes words which are unintelligible at this time.  She is tachypneic.  Patient does not currently have capacity for consent for lumbar puncture.   .Lumbar Puncture  Date/Time: 06/09/2019 9:40 AM Performed by: Daleen Bo, MD Authorized by: Daleen Bo, MD   Consent:    Consent obtained:  Verbal   Consent given by:  Healthcare agent   Risks discussed:  Bleeding and infection   Alternatives discussed:  Observation Pre-procedure details:    Procedure purpose:  Diagnostic   Preparation: Patient was prepped and draped in usual sterile fashion   Sedation:    Sedation type:  Deep Anesthesia (see MAR for exact dosages):    Anesthesia method:  None Procedure details:    Lumbar space:  L3-L4 interspace   Patient position:  R lateral decubitus   Needle gauge:  22   Needle type:  Spinal needle - Quincke tip   Needle length (in):  3.5   Ultrasound guidance: no     Number of attempts:  2   Fluid appearance:  Bloody Post-procedure:    Puncture site:  Adhesive bandage applied Comments:     First attempt abutted bone, failed.  Second attempt 1.5 cm higher, and through the interspinous process space, needle gradual advance with checking for flow, none obtained.  Needle inserted to about 3.25 inches then gradually withdrawn, with small amount of red blood obtained through port, no clearing or flow of fluid, as needle was  withdrawn.  Second attempt considered failed, efforts discontinued. .Sedation  Date/Time: 05/30/2019 9:44 AM Performed by: Daleen Bo, MD Authorized by: Daleen Bo, MD   Consent:    Consent obtained:  Verbal   Risks discussed:  Respiratory compromise necessitating ventilatory assistance and intubation and inadequate sedation   Alternatives discussed:  Analgesia without sedation Universal protocol:    Procedure explained and questions answered to patient or proxy's satisfaction: yes     Relevant documents present and verified: yes     Test results available and properly labeled: yes     Imaging studies available: yes     Site/side marked: yes     Immediately prior to procedure a time out was called: yes   Indications:    Procedure performed:  Lumbar puncture   Procedure necessitating sedation performed by:  Physician performing sedation Pre-sedation assessment:    Time since last food or drink:  4   ASA classification: class 3 - patient with severe systemic disease     Neck mobility: normal     Mouth opening:  3 or more finger widths   Mallampati score:  I - soft palate, uvula, fauces, pillars visible   Pre-sedation assessments completed and reviewed: airway patency, cardiovascular function, hydration status and mental status     Pre-sedation assessments completed and reviewed: nausea/vomiting not reviewed and pain level not reviewed     Pre-sedation assessment completed:  06/01/2019 9:00 AM Immediate pre-procedure details:    Reassessment:  Patient reassessed immediately prior to procedure     Reviewed: vital signs     Verified: bag valve mask available, emergency equipment available, intubation equipment available, IV patency confirmed and oxygen available   Procedure details (see MAR for exact dosages):    Preoxygenation:  Nasal cannula   Sedation:  Ketamine   Intra-procedure events: none     Total Provider sedation time (minutes):  35 Post-procedure details:     Post-sedation assessment completed:  06/18/2019 9:46 AM   Attendance: Constant attendance by certified staff until patient recovered     Recovery: Patient returned to pre-procedure baseline     Post-sedation assessments completed and reviewed: airway patency, cardiovascular function, hydration status and mental status     Post-sedation assessments completed and reviewed: nausea/vomiting not reviewed and pain level not reviewed     Patient is stable for discharge or admission: yes     Patient tolerance:  Tolerated well, no immediate complications   I have communicated with the admitting doctor regarding the results of the attempted lumbar puncture.  Patient is being admitted to the intensive care unit.   Daleen Bo, MD 06/22/2019 1013

## 2019-06-05 ENCOUNTER — Inpatient Hospital Stay (HOSPITAL_COMMUNITY): Payer: BC Managed Care – PPO

## 2019-06-05 ENCOUNTER — Encounter: Payer: Self-pay | Admitting: General Practice

## 2019-06-05 ENCOUNTER — Encounter: Payer: BC Managed Care – PPO | Admitting: Rehabilitation

## 2019-06-05 DIAGNOSIS — R579 Shock, unspecified: Secondary | ICD-10-CM

## 2019-06-05 DIAGNOSIS — N179 Acute kidney failure, unspecified: Secondary | ICD-10-CM

## 2019-06-05 LAB — CBC
HCT: 22.9 % — ABNORMAL LOW (ref 36.0–46.0)
Hemoglobin: 6.6 g/dL — CL (ref 12.0–15.0)
MCH: 23.5 pg — ABNORMAL LOW (ref 26.0–34.0)
MCHC: 28.8 g/dL — ABNORMAL LOW (ref 30.0–36.0)
MCV: 81.5 fL (ref 80.0–100.0)
Platelets: 313 10*3/uL (ref 150–400)
RBC: 2.81 MIL/uL — ABNORMAL LOW (ref 3.87–5.11)
RDW: 22.1 % — ABNORMAL HIGH (ref 11.5–15.5)
WBC: 73.9 10*3/uL (ref 4.0–10.5)
nRBC: 4.1 % — ABNORMAL HIGH (ref 0.0–0.2)

## 2019-06-05 LAB — URINE CULTURE: Culture: NO GROWTH

## 2019-06-05 LAB — BASIC METABOLIC PANEL
Anion gap: 14 (ref 5–15)
Anion gap: 18 — ABNORMAL HIGH (ref 5–15)
BUN: 36 mg/dL — ABNORMAL HIGH (ref 6–20)
BUN: 39 mg/dL — ABNORMAL HIGH (ref 6–20)
CO2: 15 mmol/L — ABNORMAL LOW (ref 22–32)
CO2: 11 mmol/L — ABNORMAL LOW (ref 22–32)
Calcium: 9.8 mg/dL (ref 8.9–10.3)
Calcium: 10.1 mg/dL (ref 8.9–10.3)
Chloride: 113 mmol/L — ABNORMAL HIGH (ref 98–111)
Chloride: 116 mmol/L — ABNORMAL HIGH (ref 98–111)
Creatinine, Ser: 2.45 mg/dL — ABNORMAL HIGH (ref 0.44–1.00)
Creatinine, Ser: 2.74 mg/dL — ABNORMAL HIGH (ref 0.44–1.00)
GFR calc Af Amer: 26 mL/min — ABNORMAL LOW (ref >60)
GFR calc Af Amer: 23 mL/min — ABNORMAL LOW (ref >60)
GFR calc non Af Amer: 22 mL/min — ABNORMAL LOW (ref >60)
GFR calc non Af Amer: 20 mL/min — ABNORMAL LOW (ref >60)
Glucose, Bld: 149 mg/dL — ABNORMAL HIGH (ref 70–99)
Glucose, Bld: 92 mg/dL (ref 70–99)
Potassium: 4.7 mmol/L (ref 3.5–5.1)
Potassium: 4.9 mmol/L (ref 3.5–5.1)
Sodium: 142 mmol/L (ref 135–145)
Sodium: 145 mmol/L (ref 135–145)

## 2019-06-05 LAB — PROCALCITONIN: Procalcitonin: 25.34 ng/mL

## 2019-06-05 LAB — BLOOD GAS, ARTERIAL
Acid-base deficit: 14.9 mmol/L — ABNORMAL HIGH (ref 0.0–2.0)
Bicarbonate: 11.1 mmol/L — ABNORMAL LOW (ref 20.0–28.0)
Delivery systems: POSITIVE
Drawn by: 270211
Expiratory PAP: 6
FIO2: 0.5
Inspiratory PAP: 14
Mode: POSITIVE
O2 Saturation: 97.9 %
Patient temperature: 37
pCO2 arterial: 27.4 mmHg — ABNORMAL LOW (ref 32.0–48.0)
pH, Arterial: 7.232 — ABNORMAL LOW (ref 7.350–7.450)
pO2, Arterial: 146 mmHg — ABNORMAL HIGH (ref 83.0–108.0)

## 2019-06-05 LAB — RETICULOCYTES
Immature Retic Fract: 44.4 % — ABNORMAL HIGH (ref 2.3–15.9)
RBC.: 3.83 MIL/uL — ABNORMAL LOW (ref 3.87–5.11)
Retic Count, Absolute: 47.5 10*3/uL (ref 19.0–186.0)
Retic Ct Pct: 1.2 % (ref 0.4–3.1)

## 2019-06-05 LAB — MAGNESIUM: Magnesium: 1.9 mg/dL (ref 1.7–2.4)

## 2019-06-05 LAB — VITAMIN B12: Vitamin B-12: 385 pg/mL (ref 180–914)

## 2019-06-05 LAB — IRON AND TIBC
Iron: 116 ug/dL (ref 28–170)
Saturation Ratios: 71 % — ABNORMAL HIGH (ref 10.4–31.8)
TIBC: 164 ug/dL — ABNORMAL LOW (ref 250–450)
UIBC: 48 ug/dL

## 2019-06-05 LAB — ABO/RH: ABO/RH(D): O POS

## 2019-06-05 LAB — LACTIC ACID, PLASMA: Lactic Acid, Venous: 6.2 mmol/L (ref 0.5–1.9)

## 2019-06-05 LAB — FOLATE: Folate: 13.6 ng/mL (ref >5.9)

## 2019-06-05 LAB — FERRITIN: Ferritin: 4571 ng/mL — ABNORMAL HIGH (ref 11–307)

## 2019-06-05 LAB — HEMOGLOBIN AND HEMATOCRIT, BLOOD
HCT: 33.6 % — ABNORMAL LOW (ref 36.0–46.0)
HCT: 30.7 % — ABNORMAL LOW (ref 36.0–46.0)
Hemoglobin: 9.9 g/dL — ABNORMAL LOW (ref 12.0–15.0)
Hemoglobin: 9.6 g/dL — ABNORMAL LOW (ref 12.0–15.0)

## 2019-06-05 LAB — PREPARE RBC (CROSSMATCH)

## 2019-06-05 MED ORDER — ORAL CARE MOUTH RINSE
15.0000 mL | Freq: Two times a day (BID) | OROMUCOSAL | Status: DC
  Administered 2019-06-05: 15 mL via OROMUCOSAL

## 2019-06-05 MED ORDER — ALBUMIN HUMAN 5 % IV SOLN
25.0000 g | Freq: Once | INTRAVENOUS | Status: AC
  Administered 2019-06-05: 25 g via INTRAVENOUS
  Filled 2019-06-05: qty 500

## 2019-06-05 MED ORDER — SODIUM CHLORIDE 0.9 % IV SOLN: INTRAVENOUS | Status: DC | PRN

## 2019-06-05 MED ORDER — SODIUM CHLORIDE 0.9% FLUSH: 10.0000 mL | INTRAVENOUS | Status: DC | PRN

## 2019-06-05 MED ORDER — VANCOMYCIN HCL 10 G IV SOLR
1250.0000 mg | INTRAVENOUS | Status: DC
  Administered 2019-06-05: 20:00:00 1250 mg via INTRAVENOUS
  Filled 2019-06-05: qty 1250

## 2019-06-05 MED ORDER — SODIUM CHLORIDE 0.9% IV SOLUTION
Freq: Once | INTRAVENOUS | Status: AC
  Administered 2019-06-05: 08:00:00 via INTRAVENOUS

## 2019-06-05 MED ORDER — PHENYLEPHRINE HCL-NACL 10-0.9 MG/250ML-% IV SOLN
INTRAVENOUS | Status: AC
  Administered 2019-06-05: 150 ug/min via INTRAVENOUS
  Filled 2019-06-05: qty 250

## 2019-06-05 MED ORDER — PHENYLEPHRINE HCL-NACL 10-0.9 MG/250ML-% IV SOLN
0.0000 ug/min | INTRAVENOUS | Status: DC
  Administered 2019-06-05: 04:00:00 135 ug/min via INTRAVENOUS
  Administered 2019-06-05: 150 ug/min via INTRAVENOUS
  Administered 2019-06-05 (×2): 140 ug/min via INTRAVENOUS
  Administered 2019-06-05: 150 ug/min via INTRAVENOUS
  Administered 2019-06-05: 210 ug/min via INTRAVENOUS
  Administered 2019-06-05: 200 ug/min via INTRAVENOUS
  Filled 2019-06-05: qty 250
  Filled 2019-06-05: qty 500
  Filled 2019-06-05 (×5): qty 250

## 2019-06-05 MED ORDER — ALBUMIN HUMAN 25 % IV SOLN
INTRAVENOUS | Status: AC
  Administered 2019-06-05: 01:00:00 via INTRAVENOUS
  Filled 2019-06-05: qty 100

## 2019-06-05 MED ORDER — PHENYLEPHRINE HCL-NACL 40-0.9 MG/250ML-% IV SOLN
0.0000 ug/min | INTRAVENOUS | Status: DC
  Administered 2019-06-05: 260 ug/min via INTRAVENOUS
  Administered 2019-06-05: 200 ug/min via INTRAVENOUS
  Administered 2019-06-05: 260 ug/min via INTRAVENOUS
  Administered 2019-06-05: 13:00:00 250 ug/min via INTRAVENOUS
  Administered 2019-06-06: 300 ug/min via INTRAVENOUS
  Filled 2019-06-05 (×9): qty 250

## 2019-06-05 MED ORDER — FENTANYL CITRATE (PF) 100 MCG/2ML IJ SOLN
12.5000 ug | INTRAMUSCULAR | Status: DC | PRN
  Administered 2019-06-05 – 2019-06-06 (×3): 12.5 ug via INTRAVENOUS
  Filled 2019-06-05 (×3): qty 2

## 2019-06-05 MED ORDER — ALBUMIN HUMAN 5 % IV SOLN
25.0000 g | Freq: Once | INTRAVENOUS | Status: AC
  Administered 2019-06-05: 12.5 g via INTRAVENOUS
  Filled 2019-06-05: qty 500

## 2019-06-05 MED ORDER — VASOPRESSIN 20 UNIT/ML IV SOLN
0.0400 [IU]/min | INTRAVENOUS | Status: DC
  Administered 2019-06-05: 0.04 [IU]/min via INTRAVENOUS
  Filled 2019-06-05: qty 2

## 2019-06-05 MED ORDER — SODIUM CHLORIDE 0.9% FLUSH
10.0000 mL | Freq: Two times a day (BID) | INTRAVENOUS | Status: DC
  Administered 2019-06-05 – 2019-06-06 (×4): 10 mL

## 2019-06-05 MED ORDER — SODIUM BICARBONATE-DEXTROSE 150-5 MEQ/L-% IV SOLN
150.0000 meq | INTRAVENOUS | Status: DC
  Administered 2019-06-05 – 2019-06-06 (×2): 150 meq via INTRAVENOUS
  Filled 2019-06-05 (×3): qty 1000

## 2019-06-05 MED ORDER — CHLORHEXIDINE GLUCONATE 0.12 % MT SOLN
15.0000 mL | Freq: Two times a day (BID) | OROMUCOSAL | Status: DC
  Administered 2019-06-05 – 2019-06-06 (×3): 15 mL via OROMUCOSAL
  Filled 2019-06-05 (×2): qty 15

## 2019-06-05 MED ORDER — SODIUM BICARBONATE 8.4 % IV SOLN: INTRAVENOUS | Status: DC

## 2019-06-05 MED ORDER — ALBUMIN HUMAN 25 % IV SOLN
12.5000 g | Freq: Once | INTRAVENOUS | Status: AC
  Administered 2019-06-05: 01:00:00 via INTRAVENOUS

## 2019-06-05 MED ORDER — LACTATED RINGERS IV BOLUS
500.0000 mL | Freq: Once | INTRAVENOUS | Status: AC
  Administered 2019-06-05: 500 mL via INTRAVENOUS

## 2019-06-05 NOTE — Progress Notes (Addendum)
HEMATOLOGY-ONCOLOGY PROGRESS NOTE  SUBJECTIVE: Brittany Rodgers is a little more alert this morning.  She opens her eyes and is able to answer yes/no questions.  The patient is currently on BiPAP.  Denies pain.  Afebrile overnight.  Developed SVT overnight and received adenosine followed by an amiodarone drip which was then discontinued secondary to hypotension.  Remains on IV antibiotics.  Her sister and niece are at the bedside.    Oncology History  Malignant neoplasm of lower-outer quadrant of right breast of female, estrogen receptor negative ([place])  11/04/2018 Initial Diagnosis   Malignant neoplasm of lower-outer quadrant of right breast of female, estrogen receptor negative ([place])   11/26/2018 - 02/24/2019 Chemotherapy   The patient had DOXOrubicin (ADRIAMYCIN) chemo injection 106 mg, 60 mg/m2 = 106 mg, Intravenous,  Once, 4 of 4 cycles Administration: 106 mg (11/26/2018), 106 mg (12/10/2018), 106 mg (12/24/2018), 106 mg (01/07/2019) palonosetron (ALOXI) injection 0.25 mg, 0.25 mg, Intravenous,  Once, 6 of 10 cycles Administration: 0.25 mg (11/26/2018), 0.25 mg (01/28/2019), 0.25 mg (12/10/2018), 0.25 mg (12/24/2018), 0.25 mg (01/07/2019), 0.25 mg (02/11/2019), 0.25 mg (02/04/2019), 0.25 mg (02/18/2019) pegfilgrastim-cbqv (UDENYCA) injection 6 mg, 6 mg, Subcutaneous, Once, 4 of 4 cycles Administration: 6 mg (12/12/2018), 6 mg (12/26/2018), 6 mg (01/09/2019) CARBOplatin (PARAPLATIN) 240 mg in sodium chloride 0.9 % 250 mL chemo infusion, 240 mg (99 % of original dose 242.4 mg), Intravenous,  Once, 2 of 6 cycles Dose modification:   (original dose 242.4 mg, Cycle 5), 240 mg (original dose 242.4 mg, Cycle 5),   (original dose 242.4 mg, Cycle 6, Reason: Provider Judgment) Administration: 240 mg (01/28/2019), 240 mg (02/11/2019), 240 mg (02/04/2019), 240 mg (02/18/2019) cyclophosphamide (CYTOXAN) 1,060 mg in sodium chloride 0.9 % 250 mL chemo infusion, 600 mg/m2 = 1,060 mg, Intravenous,  Once, 4 of 4 cycles Administration: 1,060 mg  (11/26/2018), 1,060 mg (12/10/2018), 1,060 mg (12/24/2018), 1,060 mg (01/07/2019) PACLitaxel (TAXOL) 144 mg in sodium chloride 0.9 % 250 mL chemo infusion (</= 100m/m2), 80 mg/m2 = 144 mg, Intravenous,  Once, 2 of 6 cycles Administration: 144 mg (01/28/2019), 144 mg (02/11/2019), 144 mg (02/04/2019), 144 mg (02/18/2019) fosaprepitant (EMEND) 150 mg, dexamethasone (DECADRON) 12 mg in sodium chloride 0.9 % 145 mL IVPB, , Intravenous,  Once, 5 of 5 cycles Administration:  (11/26/2018),  (01/28/2019),  (12/10/2018),  (12/24/2018),  (01/07/2019)  for chemotherapy treatment.    04/02/2019 -  Chemotherapy   The patient had pembrolizumab (KEYTRUDA) 200 mg in sodium chloride 0.9 % 50 mL chemo infusion, 200 mg, Intravenous, Once, 2 of 6 cycles Administration: 200 mg (04/02/2019), 200 mg (05/13/2019)  for chemotherapy treatment.    05/13/2019 -  Chemotherapy   The patient had palonosetron (ALOXI) injection 0.25 mg, 0.25 mg, Intravenous,  Once, 1 of 4 cycles Administration: 0.25 mg (05/13/2019), 0.25 mg (05/19/2019) pegfilgrastim (NEULASTA ONPRO KIT) injection 6 mg, 6 mg, Subcutaneous, Once, 1 of 4 cycles Administration: 6 mg (05/19/2019) CARBOplatin (PARAPLATIN) 190 mg in sodium chloride 0.9 % 250 mL chemo infusion, 240 mg (100 % of original dose 243.8 mg), Intravenous,  Once, 1 of 4 cycles Dose modification:   (original dose 243.8 mg, Cycle 1) Administration: 190 mg (05/13/2019), 190 mg (05/19/2019) gemcitabine (GEMZAR) 1,862 mg in sodium chloride 0.9 % 250 mL chemo infusion, 1,000 mg/m2 = 1,862 mg, Intravenous,  Once, 1 of 4 cycles Administration: 1,862 mg (05/13/2019), 1,862 mg (05/19/2019)  for chemotherapy treatment.       REVIEW OF SYSTEMS:   Unable to obtain a comprehensive review of  systems secondary to patient condition.  I have reviewed the past medical history, past surgical history, social history and family history with the patient and they are unchanged from previous note.   PHYSICAL EXAMINATION: ECOG  PERFORMANCE STATUS: 3 - Symptomatic, >50% confined to bed  Vitals:   06/05/19 0821 06/05/19 0847  BP:  128/74  Pulse: 93 88  Resp: (!) 31 (!) 33  Temp:  (!) (P) 96.4 F (35.8 C)  SpO2:  (P) 90%   Filed Weights   06/11/2019 0631 06/17/2019 1121 06/05/19 0413  Weight: 154 lb 14.4 oz (70.3 kg) 153 lb 3.5 oz (69.5 kg) 153 lb 3.5 oz (69.5 kg)    Intake/Output from previous day: 09/09 0701 - 09/10 0700 In: 7198.5 [I.V.:3300.7; IV Piggyback:3897.8] Out: 1225 [Urine:725; Drains:500]  GENERAL: Appears comfortable.  Opens eyes and is minimally responsive. SKIN: skin color, texture, turgor are normal, no rashes or significant lesions EYES: normal, Conjunctiva are pink and non-injected, sclera clear LUNGS: Diminished breath sounds on right.  Tachypneic with  increased work of breathing.  BiPAP in place. HEART: tachycardic, no murmurs.  No lower extremity edema.                               ABDOMEN:abdomen soft, non-tender and normal bowel sounds Musculoskeletal:no cyanosis of digits and no clubbing  NEURO: Alert, opens eyes and able to answer yes/no questions   LABORATORY DATA:  I have reviewed the data as listed CMP Latest Ref Rng & Units 06/05/2019 06/22/2019 05/29/2019  Glucose 70 - 99 mg/dL 149(H) 120(H) 94  BUN 6 - 20 mg/dL 36(H) 39(H) 24(H)  Creatinine 0.44 - 1.00 mg/dL 2.45(H) 2.66(H) 1.03(H)  Sodium 135 - 145 mmol/L 142 139 132(L)  Potassium 3.5 - 5.1 mmol/L 4.7 4.7 4.4  Chloride 98 - 111 mmol/L 113(H) 98 95(L)  CO2 22 - 32 mmol/L 15(L) 20(L) 24  Calcium 8.9 - 10.3 mg/dL 9.8 13.4(HH) 11.7(H)  Total Protein 6.5 - 8.1 g/dL - 6.7 6.2(L)  Total Bilirubin 0.3 - 1.2 mg/dL - 0.9 0.4  Alkaline Phos 38 - 126 U/L - 241(H) 224(H)  AST 15 - 41 U/L - 148(H) 9(L)  ALT 0 - 44 U/L - 107(H) 23    Lab Results  Component Value Date   WBC 73.9 (HH) 06/05/2019   HGB 6.6 (LL) 06/05/2019   HCT 22.9 (L) 06/05/2019   MCV 81.5 06/05/2019   PLT 313 06/05/2019   NEUTROABS 66.8 (H) 06/03/2019    Ct  Abdomen Pelvis Wo Contrast  Result Date: 06/09/2019 CLINICAL DATA:  Sepsis. EXAM: CT CHEST, ABDOMEN AND PELVIS WITHOUT CONTRAST TECHNIQUE: Multidetector CT imaging of the chest, abdomen and pelvis was performed following the standard protocol without IV contrast. COMPARISON:  CT scan of April 23, 2019. FINDINGS: CT CHEST FINDINGS Cardiovascular: There is no evidence of thoracic aortic aneurysm. Normal cardiac size. No pericardial effusion is noted. Right internal jugular Port-A-Cath is noted with tip at cavoatrial junction. Mediastinum/Nodes: Thyroid gland and esophagus are unremarkable. Large soft tissue mass is seen in the base of the right hemithorax which displaces the heart to the left and extends through the anterior portions of several right ribs into the right breast region. It is multilobulated in appearance and is consistent with malignancy or metastatic disease. It is significantly enlarged compared to prior exam. There is some lytic destruction of the ribs that are involved. Lungs/Pleura: Right-sided pleural drainage catheter is noted with tip  directed toward the right lung apex. Mild residual pleural effusion is noted posteriorly in the right lung base with adjacent atelectasis. Multiple pleural based masses are noted concerning for metastatic disease. Atelectasis of the right lower lobe is noted. 8 mm left apical nodule is noted concerning for possible metastatic disease. 7 mm subpleural nodule is noted laterally in the left upper lobe concerning for metastatic disease. Several other smaller nodules are noted. No pneumothorax is noted. Musculoskeletal: As noted above, there appears to be lytic destruction of the anterior portions of several right upper ribs due to the previously described soft tissue mass crossing the chest wall. No acute fracture is noted. CT ABDOMEN PELVIS FINDINGS Hepatobiliary: Grossly stable 2.7 cm possible liver mass is noted posteriorly in the right hepatic lobe. No gallstones,  gallbladder wall thickening, or biliary dilatation. Pancreas: Unremarkable. No pancreatic ductal dilatation or surrounding inflammatory changes. Spleen: Normal in size without focal abnormality. Adrenals/Urinary Tract: Adrenal glands appear normal. Nonobstructive right renal calculus is noted. No hydronephrosis or renal obstruction is noted. Urinary bladder is decompressed secondary to Foley catheter. Stomach/Bowel: The stomach appears normal. There is no evidence of bowel obstruction or inflammation. The appendix is not clearly visualized. Vascular/Lymphatic: No significant vascular findings are present. No enlarged abdominal or pelvic lymph nodes. Reproductive: Uterus and bilateral adnexa are unremarkable. Other: No abdominal wall hernia or abnormality. No abdominopelvic ascites. Musculoskeletal: No acute or significant osseous findings. IMPRESSION: Significant enlargement of mass extending from base of right hemithorax through the right anterior chest wall and into the right breast is noted, currently measuring 15 x 10 cm. This is consistent with malignancy or metastatic disease. It results in lytic destruction of the anterior portions of several right ribs. Mild probable loculated right pleural effusion is noted with adjacent subsegmental atelectasis. There does appear to be pleural based masses or thickening in the base of the right hemithorax concerning for metastatic disease. Right-sided chest tube is noted in good position. Multiple pulmonary nodules are noted in the left lung concerning for metastatic disease. Grossly stable 2.7 cm possible right hepatic mass is noted posteriorly, concerning for metastatic disease. Nonobstructive right renal calculus. Electronically Signed   By: Marijo Conception M.D.   On: 06/13/2019 15:53   Ct Head Wo Contrast  Result Date: 06/03/2019 CLINICAL DATA:  Confusion and altered mental status for 2-3 days EXAM: CT HEAD WITHOUT CONTRAST TECHNIQUE: Contiguous axial images were  obtained from the base of the skull through the vertex without intravenous contrast. COMPARISON:  05/02/2019 FINDINGS: Brain: No evidence of acute infarction, hemorrhage, hydrocephalus, extra-axial collection or mass lesion/mass effect. Vascular: No hyperdense vessel or unexpected calcification. Skull: Normal. Negative for fracture or focal lesion. Sinuses/Orbits: No acute finding. IMPRESSION: Negative head CT. Electronically Signed   By: Monte Fantasia M.D.   On: 06/17/2019 05:33   Ct Chest Wo Contrast  Result Date: 06/11/2019 CLINICAL DATA:  Sepsis. EXAM: CT CHEST, ABDOMEN AND PELVIS WITHOUT CONTRAST TECHNIQUE: Multidetector CT imaging of the chest, abdomen and pelvis was performed following the standard protocol without IV contrast. COMPARISON:  CT scan of April 23, 2019. FINDINGS: CT CHEST FINDINGS Cardiovascular: There is no evidence of thoracic aortic aneurysm. Normal cardiac size. No pericardial effusion is noted. Right internal jugular Port-A-Cath is noted with tip at cavoatrial junction. Mediastinum/Nodes: Thyroid gland and esophagus are unremarkable. Large soft tissue mass is seen in the base of the right hemithorax which displaces the heart to the left and extends through the anterior portions of several right  ribs into the right breast region. It is multilobulated in appearance and is consistent with malignancy or metastatic disease. It is significantly enlarged compared to prior exam. There is some lytic destruction of the ribs that are involved. Lungs/Pleura: Right-sided pleural drainage catheter is noted with tip directed toward the right lung apex. Mild residual pleural effusion is noted posteriorly in the right lung base with adjacent atelectasis. Multiple pleural based masses are noted concerning for metastatic disease. Atelectasis of the right lower lobe is noted. 8 mm left apical nodule is noted concerning for possible metastatic disease. 7 mm subpleural nodule is noted laterally in the left  upper lobe concerning for metastatic disease. Several other smaller nodules are noted. No pneumothorax is noted. Musculoskeletal: As noted above, there appears to be lytic destruction of the anterior portions of several right upper ribs due to the previously described soft tissue mass crossing the chest wall. No acute fracture is noted. CT ABDOMEN PELVIS FINDINGS Hepatobiliary: Grossly stable 2.7 cm possible liver mass is noted posteriorly in the right hepatic lobe. No gallstones, gallbladder wall thickening, or biliary dilatation. Pancreas: Unremarkable. No pancreatic ductal dilatation or surrounding inflammatory changes. Spleen: Normal in size without focal abnormality. Adrenals/Urinary Tract: Adrenal glands appear normal. Nonobstructive right renal calculus is noted. No hydronephrosis or renal obstruction is noted. Urinary bladder is decompressed secondary to Foley catheter. Stomach/Bowel: The stomach appears normal. There is no evidence of bowel obstruction or inflammation. The appendix is not clearly visualized. Vascular/Lymphatic: No significant vascular findings are present. No enlarged abdominal or pelvic lymph nodes. Reproductive: Uterus and bilateral adnexa are unremarkable. Other: No abdominal wall hernia or abnormality. No abdominopelvic ascites. Musculoskeletal: No acute or significant osseous findings. IMPRESSION: Significant enlargement of mass extending from base of right hemithorax through the right anterior chest wall and into the right breast is noted, currently measuring 15 x 10 cm. This is consistent with malignancy or metastatic disease. It results in lytic destruction of the anterior portions of several right ribs. Mild probable loculated right pleural effusion is noted with adjacent subsegmental atelectasis. There does appear to be pleural based masses or thickening in the base of the right hemithorax concerning for metastatic disease. Right-sided chest tube is noted in good position.  Multiple pulmonary nodules are noted in the left lung concerning for metastatic disease. Grossly stable 2.7 cm possible right hepatic mass is noted posteriorly, concerning for metastatic disease. Nonobstructive right renal calculus. Electronically Signed   By: Marijo Conception M.D.   On: 06/03/2019 15:53   Dg Chest Port 1 View  Result Date: 06/05/2019 CLINICAL DATA:  Follow-up pleural effusions EXAM: PORTABLE CHEST 1 VIEW COMPARISON:  06/19/2019 FINDINGS: Right PleurX catheter is again noted and stable. Right-sided pleural effusion and basilar atelectasis is again seen. Right chest wall port is noted in satisfactory position. Cardiac shadow is stable. Left lung remains clear. IMPRESSION: Stable right pleural effusion with basilar atelectasis. Electronically Signed   By: Inez Catalina M.D.   On: 06/05/2019 07:28   Dg Chest Port 1 View  Result Date: 06/23/2019 CLINICAL DATA:  Fever and altered mental status EXAM: PORTABLE CHEST 1 VIEW COMPARISON:  05/10/2019 FINDINGS: Right-sided tunneled pleural catheter with tip at the apex. Right porta catheter with tip at the SVC. Both are in stable position. Chronic large right pleural effusion with underlying chest wall mass by chest CT. Most of the right lower lung is opacified or obscured. Left chest is clear. Normal heart size where not obscured. IMPRESSION: Chronic large right  pleural effusion with tunneled catheter in place. Much of the right lower lung is obscured, which could limit detection of pneumonia. No acute finding when compared to priors. Electronically Signed   By: Monte Fantasia M.D.   On: 06/07/2019 05:26   Dg Chest Port 1 View  Result Date: 05/10/2019 CLINICAL DATA:  Shortness of breath. EXAM: PORTABLE CHEST 1 VIEW COMPARISON:  Radiograph 05/05/2019, additional priors. FINDINGS: Accessed right chest port remains in place. Right PleurX catheter tip at the apex. Large pleural effusion has decreased from prior exam with some improving aeration of the  right lung apex. Similar leftward mediastinal shift to before. Left lung is grossly clear. No pneumothorax or other change from prior exam. IMPRESSION: Right PleurX catheter in place with persistent but diminishing large right pleural effusion. Improving right apical aeration. Otherwise unchanged exam. Electronically Signed   By: Keith Rake M.D.   On: 05/10/2019 22:22    ASSESSMENT AND PLAN: 48 y.o. [place], [place] woman status post right breast lower outer quadrant biopsy 10/29/2018 for a clinical T2 N0, stage IIB invasive ductal carcinoma, grade 3, triple negative, with an MIB-1 of 80%             (a) CT of the chest 11/15/2018 shows possible liver metastases, but liver biopsy 11/25/2018 consistent with hemangioma             (b) bone scan 11/15/2018 shows no suspicious bone lesions  (1) neoadjuvant chemotherapy consisting of doxorubicin and cyclophosphamide in dose dense fashion x4, started 11/26/2018, completed 01/07/2019, followed by weekly paclitaxel and carboplatin x12 starting 01/28/2019  (2) patient desires ovarian function preservation:              (a) Zoladex started 11/08/2018, discontinued 02/04/2019             (b) patient is status post bilateral tubal ligation  (3)status post right mastectomy with sentinel lymph node sampling 03/04/2019 for aypTac ypN1ainvasive ductal carcinoma grade 3, repeat prognostic panel again triple negative (a)a total of 6 axillary lymph nodes removed, 2 sentinel ones, 1positive  (4) adjuvant radiationto be considered  (5) genetics counseling on 11/28/2018, testing was recommended, but declined by patient.  METASTATIC DISEASE: July 2020 (6)Staging studies: (a) chest CT scan and bone scan 11/15/2018 show a liver mass, no lung or bone lesions (b) liver biopsy 11/25/2018 shows a hemangioma (c)chest CT Angio 03/26/2019 again shows liver mass, right breast "seroma" (d)  cytology from R thoracentesis 04/12/2019 nondiagnostic (e) CT angio 04/23/2019 shows recurrent right effusion, multiple bilateral pulmonary nodules, invasive anterior right chest wall lesion             (f) biopsy of the chest wall lesion on 04/24/2019 and cytology from the right pleural effusion the same day confirms metastatic breast cancer             (g) CARIS study results pending  (7) pembrolizumabstarted 04/02/2019, to be repeated every 21 days  (8) carboplatin and gemcitabinestarted 05/13/2019, to be repeated days 1 and 8 of each 21-day cycle  (9) pain control:             (a) currently on MS Contin 39m twice daily with MSIR 15 mg as needed             (b) so far no need for bowel prophylaxis  (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  Remains critically ill and source of infection remains unclear.  Urine culture and blood cultures are negative to date.  Culture of pleural fluid is negative to date.  Leukocytosis slowly improving.  Elevated white blood cell count likely due to acute infection in conjunction with Neulasta.  [name]'s prognosis from a breast cancer point of view is poor.  Her breast cancer grew through first-line therapy and she has triple negative breast cancer so there is no role for antiestrogen therapy.  Recommend attempting to reverse her current sepsis but agree with DNR.  We will continue ongoing discussions with her family regarding her poor prognosis from a breast cancer standpoint.  We will continue to follow along with you.    LOS: 1 day   Mikey Bussing, DNP, AGPCNP-BC, AOCNP 06/05/19    ADDENDUM: I met with patient's family members (daughter [name] rouse, sisters Pollyann Kennedy, Jodi Geralds and nephew Jackelyn Poling as well  as cousins Court Joy and Delight Hoh.  We reviewed the patient's course and they understand that while receiving the most effective chemotherapy we have for breast cancer her cancer continued to grow.  At the most recent CT scan obtained this admission there is again evidence of cancer growth.  I do not believe further chemotherapy is warranted and once the patient sufficiently recovers to be able to make medical decisions my recommendation to her will be that we moved to a full support mode and get hospice involved.  In fact the patient's daughter tells me the patient was telling her she is "tired" and did not want to "fight" anymore.  The legal situation in terms of healthcare power of attorney cannot be clarified at this point.  We had given the patient the appropriate documents and she intended to name her Sister [name] and her daughter [name] to be her healthcare powers of attorney.  The patient's husband, may legally be her healthcare power of attorney at this point but the patient has been emphatic that she does not want him involved in her care.  [name] may be reached at [phone] and [name] at [phone].  We will continue to follow with you.  I greatly appreciate your help to this patient and her family  I personally saw this patient and performed a substantive portion of this encounter with the listed APP documented above.   Chauncey Cruel, MD Medical Oncology and Hematology [facility] [address] [place], [place] [address] Tel. [phone]    Fax. [phone]

## 2019-06-05 NOTE — Progress Notes (Signed)
NAME:  Brittany Rodgers, MRN:  [id number], DOB:  1971/07/23, LOS: 1 ADMISSION DATE:  06/21/2019, CONSULTATION DATE:  9/9 REFERRING MD:   CHIEF COMPLAINT:  AMS   Brief History   Mrs. Pennebaker is a 48yo African American F with history significant for metastatic triple negative breast cancer with lung mets who presented to the ED due to complaints of AMS for over the last 2-3 days and was found to meet criteria for sepsis and elevated lactate  on admssion. PCCM consulted and admitted. Overnight 9/9 patient experienced 2 episodes of SVT prompting push of adenosine followed by initiation of Amiodarone drip that subsequently had to be discontinued due to hypotension. Also patient developed hypoxia resulting in initiation of BIPAP. Patient was transitioned to DNR/DNI on 9/9.  Significant Hospital Events   9/9 Admitted  9/9 SVT x2 events  9/9 BIPAP   Consults:  PCCM Oncology   Procedures:  Attempted LP in ED 9/9 >> unsuccessful   Significant Diagnostic Tests:  Chest xray 9/9 >> revealed chronic large right pleural effusion with much of the right lower lobed obscured, unable to rule out pneumonia. Head CT 9/9 >> negative for acute findings CT chest/ABD/pelvis 9/9 >> Significant increased size of mass extending from right hemithorax to right anterior chest wall into right breast measuring 15 x 10 cm with lytic destruction of of several ribs, multiple pulmonary nodules and stable right hepatic mass is seen  Micro Data:  COVID 9/9 >> neg PleurX culture 9/9 >> BLood culture 9/9 >> Urine culture 9/9 >> CSF culture 9/9 >> MRSA PCR 9/9 >> neg  Antimicrobials:  Cefepime 9/9 >> Vancomycin 9/9 >>  Interim history/subjective:  Reported overnight 2 episodes of SVT prompting push of adenosine followed by initiation of Amiodarone drip that subsequently had to be discontinued due to hypotension. Also patient developed hypoxia resulting in initiation of BIPAP which she remains on this morning. Afebrile  overnight, 6.9L net positive. 2 sisters at bedside this morning.   Objective   Blood pressure 122/85, pulse 84, temperature 97.6 F (36.4 C), temperature source Axillary, resp. rate (!) 28, height 5\' 2"  (1.575 m), weight 69.5 kg, SpO2 100 %.        Intake/Output Summary (Last 24 hours) at 06/05/2019 0804 Last data filed at 06/05/2019 0730 Gross per 24 hour  Intake 8561.2 ml  Output 1225 ml  Net 7336.2 ml   Filed Weights   06/24/2019 0631 06/15/2019 1121 06/05/19 0413  Weight: 70.3 kg 69.5 kg 69.5 kg    Examination: General: Alert and mildly responsive to simple questions/commands adult female, continues to appear toxic with increased respiratory demand  HENT: sclera non-icteric, extraocular movements intact. No nuchal rigidity  Lungs: Tachypenia with significantly diminished breath sounds on right worse than yesterday, slight rhonchi on left, BIPAP in place but increased work of breathing with mild accessory muscle use still seen Cardiovascular: s1s2, regular rate and rhythm, tachycardia  Abdomen: soft, mildly tender to palpation of lower ABD, hypoactive bowel sounds Extremities: warm and dry, no edema noted  Neuro: alert, unable to assess full orientation GU: foley in place  Resolved Hospital Problem list     Assessment & Plan:  Sever Sepsis / Septic Shock as evidence by rising lactic acid and signs of end organ damage including acute AKI and AMS, source unclear -in the setting of chronic right pleural effusion, worsening metastatic breast cancer with lung mets despite treatment, presence of port-a-cath and PleurX cath. Sepsis criteria as follows: Febrile with a  T-max of 102.2, RR of 38, HR of 125, AMS, WBC 87.9, with suspected infection. LP attempted in ED, unsuccessful, deferred re-attempt at LP for now . No visible signs of infection seen at line sites.  P: Received aggressive IVF resuscitation on admission will continue maintaince hydration Continue to follow WBC Lactic acid  improved with current treatment  Follow culture results including; blood, urine, and Pleural fluid   Continue broad spectrum ABT, MRSA PCR neg consider D/C of Vanc  Continue to hold home beta blocker   Acute Metabolic Encephalopathy  -In the setting of sepsis and metastatic breast cancer. Head CT negative on admission. At baseline patient is completely alert and oriented. No nuchal rigidity on exam  P: Treat acute infection as above Consider getting MRI brain if mentation doesn't improve  Follow delirium protocol  Defer re-attempt at LP for now   Acute Kidney Injury  -in the setting of sepsis and organ hypoperfusion, baseline creatinine 1.1-1.3 P: Continue IV hydration  Ensure adequate renal perfusion, avoid nephrotoxins  Monitor urine output and renal function closely  Follow Bmets  Metastatic Breast Cancer -Followed by [name]. Status post right mastectomy, started on carboplatin and gemcitabine on [id number] with no complications last dose 0000000. 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 1x dose adenosine given overnight followed by amiodarone drip, drip stopped due to hypotension  P: Continuous telemetry  Continue treatment for sepsis and lactic acidosis as above Resume beta blocker therapy with blood pressure allows Continue Neo for blood pressure support   Profound leukocytosis  -in the setting of metastatic breast cancer WBC count 87.9 on admission. Previous WBC 28.1 on 9/3 and 2.9 on 8/24. Patient received Neulasta on 05/19/2019 P: Follow with daily labs Oncology made aware   Best practice:  Diet: NPO, obtain speech eval  Pain/Anxiety/Delirium protocol (if indicated): will resume home opoid's once mentation impoves VAP protocol (if indicated): N/A DVT prophylaxis: Subq hep GI prophylaxis: PPI Glucose control: N/A Mobility: BR Code Status: Full, verified with sister over the phone  Family Communication: Will update family again at bedside, will discuss goals of care and degree of escalation of treatment given worsening metastatic cancer despite treatment  Disposition: ICU  Labs   CBC: Recent Labs  Lab 05/29/19 1512 05/28/2019 0448 06/14/2019 1227 06/05/19 0413  WBC 28.1* 87.9*  --  73.9*  NEUTROABS 18.0* 66.8*  --   --   HGB 8.8* 8.9*  --  6.6*  HCT 28.1* 29.6*  --  22.9*  MCV 75.1* 76.9*  --  81.5  PLT 42* 396 331 [id number]    Basic Metabolic Panel: Recent Labs  Lab 05/29/19 1512 06/20/2019 0448 06/24/2019 1522 06/05/19 0413  NA 132* 139  --  142  K 4.4 4.7  --  4.7  CL 95* 98  --  113*  CO2 24 20*  --  15*  GLUCOSE 94 120*  --  149*  BUN 24* 39*  --  36*  CREATININE 1.03* 2.66*  --  2.45*  CALCIUM 11.7* 13.4*  --  9.8  MG  --   --   --  1.9  PHOS  --   --  3.2  --    GFR: Estimated Creatinine Clearance: 25.4 mL/min (A) (by C-G formula based on SCr of 2.45 mg/dL (H)). Recent Labs  Lab 05/29/19 1512  06/21/2019 0448 06/02/2019 0957 06/03/2019 1229 06/14/2019 1231 06/25/2019 1522 06/23/2019 1827 06/05/19 0413  PROCALCITON  --   --   --   --  7.31  --   --   --  25.34  WBC 28.1*  --  87.9*  --   --   --   --   --  73.9*  LATICACIDVEN  --    < > 7.3* 8.3*  --  7.3* 5.8* 5.3*  --    < > = values in this interval not displayed.    Liver Function Tests: Recent Labs  Lab 05/29/19 1512 06/19/2019 0448  AST 9* 148*  ALT 23 107*  ALKPHOS 224* 241*  BILITOT 0.4 0.9  PROT 6.2* 6.7  ALBUMIN 2.8* 3.0*   Recent Labs  Lab 06/05/2019 1229  LIPASE 20  AMYLASE 44   No results for input(s): AMMONIA in the last 168 hours.  ABG    Component Value  Date/Time   PHART 7.385 06/18/2019 1134   PCO2ART 28.6 (L) 06/20/2019 1134   PO2ART 73.4 (L) 06/09/2019 1134   HCO3 16.7 (L) 06/11/2019 1134   TCO2 27 04/11/2019 2117   ACIDBASEDEF 7.0 (H) 06/16/2019 1134   O2SAT 91.7 06/25/2019 1134     Coagulation Profile: Recent Labs  Lab 06/11/2019 0448 06/14/2019 1227  INR 1.4* 1.5*    Cardiac Enzymes: Recent Labs  Lab 06/08/2019 1144  CKTOTAL 19*  CKMB 9.7*    HbA1C: Hgb A1c MFr Bld  Date/Time Value Ref Range Status  05/17/2010 09:50 PM  <5.7 % Final   5.4 (NOTE)                                                                       According to the ADA Clinical Practice Recommendations for 2011, when HbA1c is used as a screening test:   >=6.5%   Diagnostic of Diabetes Mellitus           (if abnormal result  is confirmed)  5.7-6.4%   Increased risk of developing Diabetes Mellitus  References:Diagnosis and Classification of Diabetes Mellitus,Diabetes [id number] 1):S62-S69 and Standards of Medical Care in         Diabetes - 2011,Diabetes [id number]  (Suppl 1):S11-S61.    CBG: No results for input(s): GLUCAP in the last 168 hours.  Critical care time:     CRITICAL CARE Performed by: Johnsie Cancel   Total critical care time: 45 minutes  Critical care time was exclusive of separately billable procedures and treating other patients.  Critical care was necessary to treat or prevent imminent or life-threatening deterioration.  Critical care was time spent personally by me on the following activities: development of treatment plan with patient and/or surrogate as well as nursing, discussions with consultants, evaluation of patient's response to treatment, examination of patient, obtaining history from patient or surrogate, ordering and performing treatments and interventions, ordering and review of laboratory studies, ordering and review of radiographic studies, pulse oximetry and re-evaluation of patient's condition.   Johnsie Cancel, NP-C [facility] Pgr:  [id number] or if no answer [phone] 06/05/19

## 2019-06-05 NOTE — Progress Notes (Addendum)
eLink Physician-Brief Progress Note Patient Name: Brittany Rodgers DOB: 04-05-1971 MRN: [id number]   Date of Service  06/05/2019  HPI/Events of Note  Hypotension  eICU Interventions  Amiodarone and Precedex stopped. Phenylephrine infusion ordered, Albumin 5 5 500 ml iv bolus x 1 ordered. Vasopressin infusion at 0.04 started.        [name] 06/05/2019, 12:17 AM

## 2019-06-05 NOTE — Progress Notes (Signed)
CRITICAL VALUE ALERT  Critical Value:  Lactic acid 6.2  Date & Time Notied:  6:38 PM 06/05/2019 Noted by RN  Provider Notified: Dr. Chase Caller  Orders Received/Actions taken: 500 mL LR bolus

## 2019-06-05 NOTE — Progress Notes (Signed)
[facility] Progress Note Patient Name: Brittany Rodgers DOB: 1971/03/16 MRN: [id number]   Date of Service  06/05/2019  HPI/Events of Note  Hypotension  eICU Interventions  Albumin 5  % 500 ml iv bolus x 1, Stat H & H        [name] [name] 06/05/2019, 7:32 PM

## 2019-06-05 NOTE — Progress Notes (Signed)
Pharmacy Antibiotic Note  Brittany Rodgers is a 48 y.o. female admitted on 05/29/2019 with sepsis.  Pharmacy has been consulted for Vancomycin & Cefepime dosing.  Plan: Cefepime 2gm q24 Vancomycin 1gm x1 in ED, followed by 1250mg 48hr, AUC 550, SCr 2.45  Height: 5\' 2"  (157.5 cm) Weight: 153 lb 3.5 oz (69.5 kg) IBW/kg (Calculated) : 50.1  Temp (24hrs), Avg:97.4 F (36.3 C), Min:96.4 F (35.8 C), Max:99.1 F (37.3 C)  Recent Labs  Lab 05/29/19 1512 06/11/2019 0448 05/28/2019 0957 06/05/2019 1231 06/13/2019 1522 06/09/2019 1827 06/05/19 0413  WBC 28.1* 87.9*  --   --   --   --  73.9*  CREATININE 1.03* 2.66*  --   --   --   --  2.45*  LATICACIDVEN  --  7.3* 8.3* 7.3* 5.8* 5.3*  --     Estimated Creatinine Clearance: 25.4 mL/min (A) (by C-G formula based on SCr of 2.45 mg/dL (H)).    Allergies  Allergen Reactions  . Barbiturates Other (See Comments)    Keeps pt awake  . Methocarbamol Itching  . Sulfonamide Derivatives Itching   Antimicrobials this admission: 9/9 Vancomycin >>  9/9 Zosyn >>   Dose adjustments this admission: 9/10 SCr decr to 2.45, increase Vanc to 1250mg  q48h Microbiology results: 9/9 BCx: ngtd 9/9 UCx: ng-final 9/9 MRSA PCR: neg 9/9 CSF: LP in ED 9/9 Pleural Fluid: ngtd  Prev Cx 8/17 Pleural fluid: ng, no organisms on gram stain  Thank you for allowing pharmacy to be a part of this patient's care.  Minda Ditto 06/05/2019 1:56 PM

## 2019-06-05 NOTE — Progress Notes (Signed)
Delay in blood work due to inability to draw blood back from chest port. IV team nurse assessed port and changed needle, blood return present, however, not enough for blood work. Critical care team aware.  Phlebotomist asked to draw blood.  Will continue to monitor.

## 2019-06-05 NOTE — Progress Notes (Signed)
CRITICAL VALUE ALERT  Critical Value: WBC 70.3 , HBG 6.6  Date & Time Notied:  9/10 0519  Provider Notified: Dr. Lucile Shutters  Orders Received/Actions taken: Awaiting new orders

## 2019-06-05 NOTE — Progress Notes (Signed)
Patient's 3 rings given to daughter, [name], to take home due to increased swelling.

## 2019-06-05 NOTE — Evaluation (Signed)
SLP Cancellation Note  Patient Details Name: Brittany Rodgers MRN: [id number] DOB: 03/19/1971   Cancelled treatment:       Reason Eval/Treat Not Completed: Fatigue/lethargy limiting ability to participate   Macario Golds 06/05/2019, 7:08 AM  Luanna Salk, MS [facility] SLP Acute Rehab Services Pager [phone] Office [phone]

## 2019-06-05 NOTE — Progress Notes (Signed)
[facility] Progress Note Patient Name: Brittany Rodgers DOB: 07/06/1971 MRN: [id number]   Date of Service  06/05/2019  HPI/Events of Note  Hemoglobin 6.6 gm %  eICU Interventions  Transfuse 2 units PRBC        Okoronkwo U Ogan 06/05/2019, 5:24 AM

## 2019-06-06 DIAGNOSIS — R0902 Hypoxemia: Secondary | ICD-10-CM

## 2019-06-06 DIAGNOSIS — C50919 Malignant neoplasm of unspecified site of unspecified female breast: Secondary | ICD-10-CM

## 2019-06-06 DIAGNOSIS — E872 Acidosis, unspecified: Secondary | ICD-10-CM

## 2019-06-06 LAB — TYPE AND SCREEN
ABO/RH(D): O POS
Antibody Screen: NEGATIVE
Unit division: 0

## 2019-06-06 LAB — BASIC METABOLIC PANEL
Anion gap: 21 — ABNORMAL HIGH (ref 5–15)
BUN: 41 mg/dL — ABNORMAL HIGH (ref 6–20)
CO2: 9 mmol/L — ABNORMAL LOW (ref 22–32)
Calcium: 9.2 mg/dL (ref 8.9–10.3)
Chloride: 115 mmol/L — ABNORMAL HIGH (ref 98–111)
Creatinine, Ser: 2.99 mg/dL — ABNORMAL HIGH (ref 0.44–1.00)
GFR calc Af Amer: 20 mL/min — ABNORMAL LOW (ref >60)
GFR calc non Af Amer: 18 mL/min — ABNORMAL LOW (ref >60)
Glucose, Bld: 119 mg/dL — ABNORMAL HIGH (ref 70–99)
Potassium: 4.9 mmol/L (ref 3.5–5.1)
Sodium: 145 mmol/L (ref 135–145)

## 2019-06-06 LAB — BPAM RBC
Blood Product Expiration Date: 202010112359
ISSUE DATE / TIME: 202009100819
Unit Type and Rh: 5100

## 2019-06-06 LAB — GLUCOSE, CAPILLARY: Glucose-Capillary: 77 mg/dL (ref 70–99)

## 2019-06-06 LAB — PROCALCITONIN: Procalcitonin: 27.42 ng/mL

## 2019-06-06 MED ORDER — FUROSEMIDE 10 MG/ML IJ SOLN
80.0000 mg | Freq: Once | INTRAMUSCULAR | Status: AC
  Administered 2019-06-06: 80 mg via INTRAVENOUS
  Filled 2019-06-06: qty 8

## 2019-06-06 MED ORDER — LORAZEPAM 2 MG/ML IJ SOLN: 1.0000 mg | INTRAMUSCULAR | Status: DC | PRN

## 2019-06-06 MED ORDER — MORPHINE 100MG IN NS 100ML (1MG/ML) PREMIX INFUSION
10.0000 mg/h | INTRAVENOUS | Status: DC
  Administered 2019-06-06: 5 mg/h via INTRAVENOUS
  Filled 2019-06-06: qty 100

## 2019-06-06 MED ORDER — AMIODARONE IV BOLUS ONLY 150 MG/100ML
INTRAVENOUS | Status: AC
  Filled 2019-06-06: qty 100

## 2019-06-06 MED ORDER — AMIODARONE IV BOLUS ONLY 150 MG/100ML
150.0000 mg | Freq: Once | INTRAVENOUS | Status: AC
  Administered 2019-06-06: 150 mg via INTRAVENOUS

## 2019-06-07 LAB — BODY FLUID CULTURE
Culture: NO GROWTH
Gram Stain: NONE SEEN

## 2019-06-08 LAB — ACID FAST CULTURE WITH REFLEXED SENSITIVITIES (MYCOBACTERIA): Acid Fast Culture: NEGATIVE

## 2019-06-09 ENCOUNTER — Other Ambulatory Visit: Payer: Self-pay | Admitting: Oncology

## 2019-06-09 LAB — CULTURE, BLOOD (ROUTINE X 2)
Culture: NO GROWTH
Culture: NO GROWTH
Special Requests: ADEQUATE

## 2019-06-11 ENCOUNTER — Ambulatory Visit: Payer: BC Managed Care – PPO

## 2019-06-11 ENCOUNTER — Other Ambulatory Visit: Payer: BC Managed Care – PPO

## 2019-06-11 ENCOUNTER — Ambulatory Visit: Payer: BC Managed Care – PPO | Admitting: Adult Health

## 2019-06-11 ENCOUNTER — Encounter: Payer: BC Managed Care – PPO | Admitting: Rehabilitation

## 2019-06-12 ENCOUNTER — Telehealth: Payer: Self-pay

## 2019-06-12 NOTE — Telephone Encounter (Signed)
Received dc from [facility]. Dc is for cremation and a patient of Doctor Ramaswamy.  Dc will be taken to Pulmonary Unit for signature.  On 06/13/2019 Received dc back from Doctor Ramaswamy. I called the funeral home to let them know the dc is ready for pickup.

## 2019-06-13 ENCOUNTER — Encounter (HOSPITAL_COMMUNITY): Payer: Self-pay | Admitting: Oncology

## 2019-06-18 ENCOUNTER — Encounter: Payer: BC Managed Care – PPO | Admitting: Physical Therapy

## 2019-06-20 ENCOUNTER — Encounter: Payer: Self-pay | Admitting: *Deleted

## 2019-06-23 ENCOUNTER — Encounter: Payer: BC Managed Care – PPO | Admitting: Rehabilitation

## 2019-06-25 ENCOUNTER — Other Ambulatory Visit: Payer: BC Managed Care – PPO

## 2019-06-25 ENCOUNTER — Ambulatory Visit: Payer: BC Managed Care – PPO

## 2019-06-26 NOTE — Progress Notes (Signed)
SLP Cancellation Note  Patient Details Name: Brittany Rodgers MRN: [id number] DOB: Jun 15, 1971   Cancelled treatment:       Reason Eval/Treat Not Completed: Medical issues which prohibited therapy. Pt continues to require BiPAP at this time. Will continue efforts.   Celia B. Quentin Ore [facility], CCC-SLP Speech Language Pathologist [phone]  Shonna Chock 12-Jun-2019, 11:00 AM

## 2019-06-26 NOTE — Progress Notes (Signed)
HEMATOLOGY-ONCOLOGY PROGRESS NOTE  SUBJECTIVE: Brittany Rodgers looks worse this morning.  Less responsive.  There was no family at the bedside at the time of my visit.  She remains on BiPAP.  Remains tachypneic.  Had another episode of SVT overnight and resolved with amiodarone push.  Positive almost 7 L.  Oncology History  Malignant neoplasm of female breast ([place])  11/04/2018 Initial Diagnosis   Malignant neoplasm of lower-outer quadrant of right breast of female, estrogen receptor negative ([place])   11/26/2018 - 02/24/2019 Chemotherapy   The patient had DOXOrubicin (ADRIAMYCIN) chemo injection 106 mg, 60 mg/m2 = 106 mg, Intravenous,  Once, 4 of 4 cycles Administration: 106 mg (11/26/2018), 106 mg (12/10/2018), 106 mg (12/24/2018), 106 mg (01/07/2019) palonosetron (ALOXI) injection 0.25 mg, 0.25 mg, Intravenous,  Once, 6 of 10 cycles Administration: 0.25 mg (11/26/2018), 0.25 mg (01/28/2019), 0.25 mg (12/10/2018), 0.25 mg (12/24/2018), 0.25 mg (01/07/2019), 0.25 mg (02/11/2019), 0.25 mg (02/04/2019), 0.25 mg (02/18/2019) pegfilgrastim-cbqv (UDENYCA) injection 6 mg, 6 mg, Subcutaneous, Once, 4 of 4 cycles Administration: 6 mg (12/12/2018), 6 mg (12/26/2018), 6 mg (01/09/2019) CARBOplatin (PARAPLATIN) 240 mg in sodium chloride 0.9 % 250 mL chemo infusion, 240 mg (99 % of original dose 242.4 mg), Intravenous,  Once, 2 of 6 cycles Dose modification:   (original dose 242.4 mg, Cycle 5), 240 mg (original dose 242.4 mg, Cycle 5),   (original dose 242.4 mg, Cycle 6, Reason: Provider Judgment) Administration: 240 mg (01/28/2019), 240 mg (02/11/2019), 240 mg (02/04/2019), 240 mg (02/18/2019) cyclophosphamide (CYTOXAN) 1,060 mg in sodium chloride 0.9 % 250 mL chemo infusion, 600 mg/m2 = 1,060 mg, Intravenous,  Once, 4 of 4 cycles Administration: 1,060 mg (11/26/2018), 1,060 mg (12/10/2018), 1,060 mg (12/24/2018), 1,060 mg (01/07/2019) PACLitaxel (TAXOL) 144 mg in sodium chloride 0.9 % 250 mL chemo infusion (</= 82m/m2), 80 mg/m2 = 144 mg,  Intravenous,  Once, 2 of 6 cycles Administration: 144 mg (01/28/2019), 144 mg (02/11/2019), 144 mg (02/04/2019), 144 mg (02/18/2019) fosaprepitant (EMEND) 150 mg, dexamethasone (DECADRON) 12 mg in sodium chloride 0.9 % 145 mL IVPB, , Intravenous,  Once, 5 of 5 cycles Administration:  (11/26/2018),  (01/28/2019),  (12/10/2018),  (12/24/2018),  (01/07/2019)  for chemotherapy treatment.    04/02/2019 -  Chemotherapy   The patient had pembrolizumab (KEYTRUDA) 200 mg in sodium chloride 0.9 % 50 mL chemo infusion, 200 mg, Intravenous, Once, 2 of 6 cycles Administration: 200 mg (04/02/2019), 200 mg (05/13/2019)  for chemotherapy treatment.    05/13/2019 -  Chemotherapy   The patient had palonosetron (ALOXI) injection 0.25 mg, 0.25 mg, Intravenous,  Once, 1 of 4 cycles Administration: 0.25 mg (05/13/2019), 0.25 mg (05/19/2019) pegfilgrastim (NEULASTA ONPRO KIT) injection 6 mg, 6 mg, Subcutaneous, Once, 1 of 4 cycles Administration: 6 mg (05/19/2019) CARBOplatin (PARAPLATIN) 190 mg in sodium chloride 0.9 % 250 mL chemo infusion, 240 mg (100 % of original dose 243.8 mg), Intravenous,  Once, 1 of 4 cycles Dose modification:   (original dose 243.8 mg, Cycle 1) Administration: 190 mg (05/13/2019), 190 mg (05/19/2019) gemcitabine (GEMZAR) 1,862 mg in sodium chloride 0.9 % 250 mL chemo infusion, 1,000 mg/m2 = 1,862 mg, Intravenous,  Once, 1 of 4 cycles Administration: 1,862 mg (05/13/2019), 1,862 mg (05/19/2019)  for chemotherapy treatment.       REVIEW OF SYSTEMS:   Unable to obtain a comprehensive review of systems secondary to patient condition.  I have reviewed the past medical history, past surgical history, social history and family history with the patient and they are unchanged from  previous note.   PHYSICAL EXAMINATION: ECOG PERFORMANCE STATUS: 3 - Symptomatic, >50% confined to bed  Vitals:   26-Jun-2019 1000 06-26-19 1100  BP: (!) 114/31 104/78  Pulse: 89 87  Resp: (!) 30 (!) 41  Temp:    SpO2: 100% 96%    Filed Weights   05/28/2019 1121 06/05/19 0413 June 26, 2019 0500  Weight: 153 lb 3.5 oz (69.5 kg) 153 lb 3.5 oz (69.5 kg) 174 lb 6.1 oz (79.1 kg)    Intake/Output from previous day: 09/10 0701 - 09/11 0700 In: 7197.3 [I.V.:5423.6; Blood:315; IV Piggyback:1458.7] Out: 245 [Urine:245]  GENERAL: Appears comfortable.  Unresponsive LUNGS: Diminished breath sounds on right.  Tachypneic with  increased work of breathing.  BiPAP in place. HEART: tachycardic, no murmurs.  No lower extremity edema.                               ABDOMEN:abdomen soft, non-tender and normal bowel sounds Musculoskeletal:no cyanosis of digits and no clubbing  NEURO: Unresponsive, does not open eyes  LABORATORY DATA:  I have reviewed the data as listed CMP Latest Ref Rng & Units 06-26-2019 06/05/2019 06/05/2019  Glucose 70 - 99 mg/dL 119(H) 92 149(H)  BUN 6 - 20 mg/dL 41(H) 39(H) 36(H)  Creatinine 0.44 - 1.00 mg/dL 2.99(H) 2.74(H) 2.45(H)  Sodium 135 - 145 mmol/L 145 145 142  Potassium 3.5 - 5.1 mmol/L 4.9 4.9 4.7  Chloride 98 - 111 mmol/L 115(H) 116(H) 113(H)  CO2 22 - 32 mmol/L 9(L) 11(L) 15(L)  Calcium 8.9 - 10.3 mg/dL 9.2 10.1 9.8  Total Protein 6.5 - 8.1 g/dL - - -  Total Bilirubin 0.3 - 1.2 mg/dL - - -  Alkaline Phos 38 - 126 U/L - - -  AST 15 - 41 U/L - - -  ALT 0 - 44 U/L - - -    Lab Results  Component Value Date   WBC 73.9 (HH) 06/05/2019   HGB 9.6 (L) 06/05/2019   HCT 30.7 (L) 06/05/2019   MCV 81.5 06/05/2019   PLT 313 06/05/2019   NEUTROABS 66.8 (H) 06/17/2019    Ct Abdomen Pelvis Wo Contrast  Result Date: 06/07/2019 CLINICAL DATA:  Sepsis. EXAM: CT CHEST, ABDOMEN AND PELVIS WITHOUT CONTRAST TECHNIQUE: Multidetector CT imaging of the chest, abdomen and pelvis was performed following the standard protocol without IV contrast. COMPARISON:  CT scan of April 23, 2019. FINDINGS: CT CHEST FINDINGS Cardiovascular: There is no evidence of thoracic aortic aneurysm. Normal cardiac size. No pericardial  effusion is noted. Right internal jugular Port-A-Cath is noted with tip at cavoatrial junction. Mediastinum/Nodes: Thyroid gland and esophagus are unremarkable. Large soft tissue mass is seen in the base of the right hemithorax which displaces the heart to the left and extends through the anterior portions of several right ribs into the right breast region. It is multilobulated in appearance and is consistent with malignancy or metastatic disease. It is significantly enlarged compared to prior exam. There is some lytic destruction of the ribs that are involved. Lungs/Pleura: Right-sided pleural drainage catheter is noted with tip directed toward the right lung apex. Mild residual pleural effusion is noted posteriorly in the right lung base with adjacent atelectasis. Multiple pleural based masses are noted concerning for metastatic disease. Atelectasis of the right lower lobe is noted. 8 mm left apical nodule is noted concerning for possible metastatic disease. 7 mm subpleural nodule is noted laterally in the left upper lobe concerning for  metastatic disease. Several other smaller nodules are noted. No pneumothorax is noted. Musculoskeletal: As noted above, there appears to be lytic destruction of the anterior portions of several right upper ribs due to the previously described soft tissue mass crossing the chest wall. No acute fracture is noted. CT ABDOMEN PELVIS FINDINGS Hepatobiliary: Grossly stable 2.7 cm possible liver mass is noted posteriorly in the right hepatic lobe. No gallstones, gallbladder wall thickening, or biliary dilatation. Pancreas: Unremarkable. No pancreatic ductal dilatation or surrounding inflammatory changes. Spleen: Normal in size without focal abnormality. Adrenals/Urinary Tract: Adrenal glands appear normal. Nonobstructive right renal calculus is noted. No hydronephrosis or renal obstruction is noted. Urinary bladder is decompressed secondary to Foley catheter. Stomach/Bowel: The stomach  appears normal. There is no evidence of bowel obstruction or inflammation. The appendix is not clearly visualized. Vascular/Lymphatic: No significant vascular findings are present. No enlarged abdominal or pelvic lymph nodes. Reproductive: Uterus and bilateral adnexa are unremarkable. Other: No abdominal wall hernia or abnormality. No abdominopelvic ascites. Musculoskeletal: No acute or significant osseous findings. IMPRESSION: Significant enlargement of mass extending from base of right hemithorax through the right anterior chest wall and into the right breast is noted, currently measuring 15 x 10 cm. This is consistent with malignancy or metastatic disease. It results in lytic destruction of the anterior portions of several right ribs. Mild probable loculated right pleural effusion is noted with adjacent subsegmental atelectasis. There does appear to be pleural based masses or thickening in the base of the right hemithorax concerning for metastatic disease. Right-sided chest tube is noted in good position. Multiple pulmonary nodules are noted in the left lung concerning for metastatic disease. Grossly stable 2.7 cm possible right hepatic mass is noted posteriorly, concerning for metastatic disease. Nonobstructive right renal calculus. Electronically Signed   By: Marijo Conception M.D.   On: 06/10/2019 15:53   Ct Head Wo Contrast  Result Date: 06/23/2019 CLINICAL DATA:  Confusion and altered mental status for 2-3 days EXAM: CT HEAD WITHOUT CONTRAST TECHNIQUE: Contiguous axial images were obtained from the base of the skull through the vertex without intravenous contrast. COMPARISON:  05/02/2019 FINDINGS: Brain: No evidence of acute infarction, hemorrhage, hydrocephalus, extra-axial collection or mass lesion/mass effect. Vascular: No hyperdense vessel or unexpected calcification. Skull: Normal. Negative for fracture or focal lesion. Sinuses/Orbits: No acute finding. IMPRESSION: Negative head CT. Electronically  Signed   By: Monte Fantasia M.D.   On: 06/02/2019 05:33   Ct Chest Wo Contrast  Result Date: 06/05/2019 CLINICAL DATA:  Sepsis. EXAM: CT CHEST, ABDOMEN AND PELVIS WITHOUT CONTRAST TECHNIQUE: Multidetector CT imaging of the chest, abdomen and pelvis was performed following the standard protocol without IV contrast. COMPARISON:  CT scan of April 23, 2019. FINDINGS: CT CHEST FINDINGS Cardiovascular: There is no evidence of thoracic aortic aneurysm. Normal cardiac size. No pericardial effusion is noted. Right internal jugular Port-A-Cath is noted with tip at cavoatrial junction. Mediastinum/Nodes: Thyroid gland and esophagus are unremarkable. Large soft tissue mass is seen in the base of the right hemithorax which displaces the heart to the left and extends through the anterior portions of several right ribs into the right breast region. It is multilobulated in appearance and is consistent with malignancy or metastatic disease. It is significantly enlarged compared to prior exam. There is some lytic destruction of the ribs that are involved. Lungs/Pleura: Right-sided pleural drainage catheter is noted with tip directed toward the right lung apex. Mild residual pleural effusion is noted posteriorly in the right lung base  with adjacent atelectasis. Multiple pleural based masses are noted concerning for metastatic disease. Atelectasis of the right lower lobe is noted. 8 mm left apical nodule is noted concerning for possible metastatic disease. 7 mm subpleural nodule is noted laterally in the left upper lobe concerning for metastatic disease. Several other smaller nodules are noted. No pneumothorax is noted. Musculoskeletal: As noted above, there appears to be lytic destruction of the anterior portions of several right upper ribs due to the previously described soft tissue mass crossing the chest wall. No acute fracture is noted. CT ABDOMEN PELVIS FINDINGS Hepatobiliary: Grossly stable 2.7 cm possible liver mass is noted  posteriorly in the right hepatic lobe. No gallstones, gallbladder wall thickening, or biliary dilatation. Pancreas: Unremarkable. No pancreatic ductal dilatation or surrounding inflammatory changes. Spleen: Normal in size without focal abnormality. Adrenals/Urinary Tract: Adrenal glands appear normal. Nonobstructive right renal calculus is noted. No hydronephrosis or renal obstruction is noted. Urinary bladder is decompressed secondary to Foley catheter. Stomach/Bowel: The stomach appears normal. There is no evidence of bowel obstruction or inflammation. The appendix is not clearly visualized. Vascular/Lymphatic: No significant vascular findings are present. No enlarged abdominal or pelvic lymph nodes. Reproductive: Uterus and bilateral adnexa are unremarkable. Other: No abdominal wall hernia or abnormality. No abdominopelvic ascites. Musculoskeletal: No acute or significant osseous findings. IMPRESSION: Significant enlargement of mass extending from base of right hemithorax through the right anterior chest wall and into the right breast is noted, currently measuring 15 x 10 cm. This is consistent with malignancy or metastatic disease. It results in lytic destruction of the anterior portions of several right ribs. Mild probable loculated right pleural effusion is noted with adjacent subsegmental atelectasis. There does appear to be pleural based masses or thickening in the base of the right hemithorax concerning for metastatic disease. Right-sided chest tube is noted in good position. Multiple pulmonary nodules are noted in the left lung concerning for metastatic disease. Grossly stable 2.7 cm possible right hepatic mass is noted posteriorly, concerning for metastatic disease. Nonobstructive right renal calculus. Electronically Signed   By: Marijo Conception M.D.   On: 06/02/2019 15:53   Dg Chest Port 1 View  Result Date: 06/05/2019 CLINICAL DATA:  Hypoxia. EXAM: PORTABLE CHEST 1 VIEW COMPARISON:  Earlier  06/05/2019 and 06/16/2019 FINDINGS: Patient is rotated to the left. Right IJ Port-A-Cath unchanged with tip over the SVC. Small caliber right-sided pleural catheter with tip over the apex unchanged. Moderate opacification over the lower 2/3 of the right lung with fluid tracking towards the apex unchanged. Left lung is clear. Stable cardiomegaly. Surgical clips over the lateral soft tissues of the right upper thorax. Remainder of the exam is unchanged. IMPRESSION: Stable opacification over the lower 2/3 of the right thorax likely moderate effusion with atelectasis unchanged. Small caliber right pleural catheter unchanged. Stable cardiomegaly. Electronically Signed   By: Marin Olp M.D.   On: 06/05/2019 12:36   Dg Chest Port 1 View  Result Date: 06/05/2019 CLINICAL DATA:  Follow-up pleural effusions EXAM: PORTABLE CHEST 1 VIEW COMPARISON:  06/05/2019 FINDINGS: Right PleurX catheter is again noted and stable. Right-sided pleural effusion and basilar atelectasis is again seen. Right chest wall port is noted in satisfactory position. Cardiac shadow is stable. Left lung remains clear. IMPRESSION: Stable right pleural effusion with basilar atelectasis. Electronically Signed   By: Inez Catalina M.D.   On: 06/05/2019 07:28   Dg Chest Port 1 View  Result Date: 06/08/2019 CLINICAL DATA:  Fever and altered mental status  EXAM: PORTABLE CHEST 1 VIEW COMPARISON:  05/10/2019 FINDINGS: Right-sided tunneled pleural catheter with tip at the apex. Right porta catheter with tip at the SVC. Both are in stable position. Chronic large right pleural effusion with underlying chest wall mass by chest CT. Most of the right lower lung is opacified or obscured. Left chest is clear. Normal heart size where not obscured. IMPRESSION: Chronic large right pleural effusion with tunneled catheter in place. Much of the right lower lung is obscured, which could limit detection of pneumonia. No acute finding when compared to priors.  Electronically Signed   By: Monte Fantasia M.D.   On: 06/24/2019 05:26   Dg Chest Port 1 View  Result Date: 05/10/2019 CLINICAL DATA:  Shortness of breath. EXAM: PORTABLE CHEST 1 VIEW COMPARISON:  Radiograph 05/05/2019, additional priors. FINDINGS: Accessed right chest port remains in place. Right PleurX catheter tip at the apex. Large pleural effusion has decreased from prior exam with some improving aeration of the right lung apex. Similar leftward mediastinal shift to before. Left lung is grossly clear. No pneumothorax or other change from prior exam. IMPRESSION: Right PleurX catheter in place with persistent but diminishing large right pleural effusion. Improving right apical aeration. Otherwise unchanged exam. Electronically Signed   By: Keith Rake M.D.   On: 05/10/2019 22:22    ASSESSMENT AND PLAN: 48 y.o. Brittany Rodgers, [place] woman status post right breast lower outer quadrant biopsy 10/29/2018 for a clinical T2 N0, stage IIB invasive ductal carcinoma, grade 3, triple negative, with an MIB-1 of 80%             (a) CT of the chest 11/15/2018 shows possible liver metastases, but liver biopsy 11/25/2018 consistent with hemangioma             (b) bone scan 11/15/2018 shows no suspicious bone lesions  (1) neoadjuvant chemotherapy consisting of doxorubicin and cyclophosphamide in dose dense fashion x4, started 11/26/2018, completed 01/07/2019, followed by weekly paclitaxel and carboplatin x12 starting 01/28/2019  (2) patient desires ovarian function preservation:              (a) Zoladex started 11/08/2018, discontinued 02/04/2019             (b) patient is status post bilateral tubal ligation  (3)status post right mastectomy with sentinel lymph node sampling 03/04/2019 for aypTac ypN1ainvasive ductal carcinoma grade 3, repeat prognostic panel again triple negative (a)a total of 6 axillary lymph nodes removed, 2 sentinel ones, 1positive  (4) adjuvant radiationto be  considered  (5) genetics counseling on 11/28/2018, testing was recommended, but declined by patient.  METASTATIC DISEASE: July 2020 (6)Staging studies: (a) chest CT scan and bone scan 11/15/2018 show a liver mass, no lung or bone lesions (b) liver biopsy 11/25/2018 shows a hemangioma (c)chest CT Angio 03/26/2019 again shows liver mass, right breast "seroma" (d) cytology from R thoracentesis 04/12/2019 nondiagnostic (e) CT angio 04/23/2019 shows recurrent right effusion, multiple bilateral pulmonary nodules, invasive anterior right chest wall lesion             (f) biopsy of the chest wall lesion on 04/24/2019 and cytology from the right pleural effusion the same day confirms metastatic breast cancer             (g) CARIS study results pending  (7) pembrolizumabstarted 04/02/2019, to be repeated every 21 days  (8) carboplatin and gemcitabinestarted 05/13/2019, to be repeated days 1 and 8 of each 21-day cycle  (9) pain control:             (  a) currently on MS Contin 78m twice daily with MSIR 15 mg as needed             (b) so far no need for bowel prophylaxis  (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  She is not really responding well to current treatments. Brittany Rodgers's prognosis from a breast cancer point of view is poor.  Her breast cancer grew through first-line therapy and she has triple negative breast cancer so there is no role for antiestrogen therapy.  Discussed with PCCM who plans to transition this patient to comfort measures later today when the family arrives.  Appreciate your assistance with this patient.    LOS: 2 days   KMikey Bussing DNP, AGPCNP-BC, AOCNP 0Sep 22, 2020

## 2019-06-26 NOTE — Progress Notes (Addendum)
Long discussion again held with family today, patients daughter [name], regarding patients very poor prognosis. Explained to daughter that despite maximum medical therapy patient continues to decline she is now seen with continued acidosis, now BIPAP dependent, worsening renal function, and continued need for pressure support. Daughter agrees that her mother is in pain and she believes she is suffering right now with current treatment plan. She stated that her mother was crying this morning and that she is in "a lot of pain" and she is "so tired". After this discussion patients daughter made the discission to change treatment course to comfort measures only, she asked that family be notified and at bedside prior to transition of care   30 min spent at bedside   Johnsie Cancel, NP-C [place] Pgr: [phone] or if no answer [phone] 06-07-19

## 2019-06-26 NOTE — Progress Notes (Signed)
[facility] Progress Note Patient Name: Brittany Rodgers DOB: 12-25-1970 MRN: [id number]   Date of Service  June 30, 2019  HPI/Events of Note  Pt went back into a supra-ventricular tachycardia with a rate of 220 . She has an Amiodarone infusion going at 30 mg/ hour  eICU Interventions  Amiodarone 150 mg iv bolus then continue her maintenance infusion as scheduled.        [name] Rockelle Heuerman 06/30/19, 2:57 AM

## 2019-06-26 NOTE — Progress Notes (Signed)
Patient passed at 1503, patient had no heart sounds/breath sounds via auscultation, palpable pulses. Patient's heart rhythm per EKG monitor was asystole, strip printed. Witnessed by Nelva Bush RN. Family at beside.

## 2019-06-26 NOTE — Progress Notes (Addendum)
Merlene Laughter NP informed of patients time of death. Patients family at bedside. Patient pronounced by Darrick Meigs RN and Junie Panning RN. [facility] called. Asystole strip saved and to chart. Josph Macho wasted 60 cc's of Morphine down the drain.

## 2019-06-26 NOTE — Discharge Summary (Signed)
DISCHARGE SUMMARY    Date of admit: 06/05/2019  3:55 AM Date of discharge: 2019-07-05  6:55 PM Length of Stay: 2 days  PCP is Brittany Morale, MD  CAUSE(S) OF DEATH  Septic shock from chemotherapy and breast cancer   PROBLEM LIST Active Problems:   Malignant neoplasm of female breast ([place])   Sepsis ([place])   Acute metabolic encephalopathy   AKI (acute kidney injury) ([place])   Shock circulatory ([place])   Lactic acidosis   Hypoxia    SUMMARY Brittany Rodgers was 48 y.o. patient with    has a past medical history of Cancer ([place]), Chronic cough, Family history of breast cancer, GERD (gastroesophageal reflux disease), Hyperlipidemia, IBS (irritable bowel syndrome), and Routine gynecological examination.   has a past surgical history that includes Cesarean section; Root canal; Portacath placement (Right, 11/14/2018); Mastectomy w/ sentinel node biopsy (Right, 03/04/2019); and IR PERC PLEURAL DRAIN W/INDWELL CATH W/IMG GUIDE (05/01/2019).   Admitted on 06/25/2019 with   48yo F presented with AMS for the past 2-3 days. History primarily obtained from chart review, nursing report, and from family as patient is currently still confused. Per patients sister patient has been experiencing worsening confusion over the last 2-3 days with progressive worsening over the last 24hrs. Per sister no fever, cough, chest pain, ABD pain, urinary symptoms or lower extremity swelling prior to admission. Sister reports PleurX cath last drained 279mls light cranberry plural fluid on 06/02/2019 with no signs of infection. PleurX cath originally placed 05/01/2019.   On admission patient was found to be febrile with a T-max of 102.2, tachycardic with a HR of 125, Tachyphenic with a RR of 36, B/P within normal limits. Lab work significant for: WBC 87.9, Hgb 8.9, Hct 29.6, CO2 20, BUN 39, Creatinine 2.66, Lactic acid 7.3. ABG: 7.39/29/67.5/17.6. Chest xray revealed chronic large right pleural effusion with much of the  right lower lobed obscured, unable to rule out pneumonia. Head CT negative for acute findings. PCCM consulted and patient admitted to ICU.     Significant Hospital Events   9/9 Admitted   - Head CT 9/9 >> negative for acute findings - CT chest/ABD/pelvis 9/9 >> Significant increased size of mass extending from right hemithorax to right anterior chest wall into right breast measuring 15 x 10 cm with lytic destruction of of several ribs, multiple pulmonary nodules and stable right hepatic mass is seen - COVID 9/9 >> neg PleurX culture 9/9 >> BLood culture 9/9 >> Urine culture 9/9 >> CSF culture 9/9 >> MRSA PCR 9/9 >> neg  9/9 SVT x2 events   9/9 BIPAP  9/10 Neo started  9/11 Reported another episode of SVT overnight, resoled with amiodarone push. She remains very tachypneic with restlessness and nonverbal signs of pain. Decreased urine output overnight with generalized edema in all extremities, 6.9L net positive   Family discussion: Long discussion again held with family today, patients daughter Brittany Rodgers, regarding patients very poor prognosis. Explained to daughter that despite maximum medical therapy patient continues to decline she is now seen with continued acidosis, now BIPAP dependent, worsening renal function, and continued need for pressure support. Daughter agrees that her mother is in pain and she believes she is suffering right now with current treatment plan. She stated that her mother was crying this morning and that she is in "a lot of pain" and she is "so tired". After this discussion patients daughter made the discission to change treatment course to comfort measures only, she asked that family  be notified and at bedside prior to transition of care    Patient then expired 2019-06-28   SIGNED Dr. Brand Males, M.D., F.C.C.P Pulmonary and Critical Care Medicine Staff Physician [facility] Pager: [phone], If no answer  or between  15:00h - 7:00h: call [phone]  06/13/2019 2:24 PM

## 2019-06-26 NOTE — Progress Notes (Signed)
NAME:  Brittany Rodgers, MRN:  [id number], DOB:  [birth year], LOS: 2 ADMISSION DATE:  06/05/2019, CONSULTATION DATE:  9/9 REFERRING MD:   CHIEF COMPLAINT:  AMS   Brief History   Brittany Rodgers is a 48yo African American F with history significant for metastatic triple negative breast cancer with lung mets who presented to the ED due to complaints of AMS for over the last 2-3 days and was found to meet criteria for sepsisand elevated lactateon admssion. PCCM consulted and admitted. Overnight 9/9 patient experienced 2 episodes of SVT prompting push of adenosine followed by initiation of Amiodarone drip that subsequently had to be discontinued due to hypotension. Also patient developed hypoxia resulting in initiation of BIPAP. Patient was transitioned to DNR/DNI on 9/9.  As of 9/11 patient continues to show decline with continued acidosis, now BIPAP dependent, worsening renal function, and continued need for pressure support prompting further goals of care discussion with family. Patients daughter Brittany Rodgers was updated on patients declining condition this morning. She agrees that her mother is in pain and she believes she is suffering right now with current treatment plan. She stated that her mother was crying this morning and that she is in "a lot of pain" and she is "so tired". Long discussion was held over patients very poor prognosis and that the patient will likely not survive this hospitalization. After this discussion patients daughter made the discission to change treatment course to comfort measures only, she asked that family be contacted and at bedside prior to transition of care .  Significant Hospital Events   9/9 Admitted  9/9 SVT x2 events  9/9 BIPAP  9/10 Neo started 9/11 Comfort measures   Consults:  PCCM Oncology   Procedures:  AttemptedLPin ED9/9 >>unsuccessful  Significant Diagnostic Tests:  Chest xray 9/9 >> revealed chronic large right pleural effusion with much of the  right lower lobed obscured, unable to rule out pneumonia. Head CT 9/9 >> negative for acute findings CT chest/ABD/pelvis 9/9 >> Significant increased size of mass extending from right hemithorax to right anterior chest wall into right breast measuring 15 x 10 cm with lytic destruction of of several ribs, multiple pulmonary nodules and stable right hepatic mass is seen  Micro Data:  COVID 9/9 >> neg PleurX culture 9/9 >> BLood culture 9/9 >> Urine culture 9/9 >> CSF culture 9/9 >> MRSA PCR 9/9 >> neg  Antimicrobials:  Cefepime 9/9 >> Vancomycin 9/9 >>  Interim history/subjective:  Reported another episode of SVT overnight, resoled with amiodarone push. She remains very tachypneic with restlessness and nonverbal signs of pain. Decreased urine output overnight with generalized edema in all extremities, 6.9L net positive .  Objective   Blood pressure (!) 127/58, pulse 83, temperature 97.7 F (36.5 C), temperature source Oral, resp. rate (!) 32, height 5\' 2"  (1.575 m), weight 79.1 kg, SpO2 94 %.        Intake/Output Summary (Last 24 hours) at 2019/06/07 1051 Last data filed at 07-Jun-2019 0900 Gross per 24 hour  Intake 5185.61 ml  Output 245 ml  Net 4940.61 ml   Filed Weights   06/10/2019 1121 06/05/19 0413 06/07/19 0500  Weight: 69.5 kg 69.5 kg 79.1 kg    Examination: General:Alert and restlessness with nonverbal signs of pain HENT:sclera non-icteric, extraocular movements intact. No nuchal rigidity Lungs:Significant tachyphemia with very diminished sounds on right with mild rhonchi to left, BIPAP in place with significant accessory muscle use seen.  Cardiovascular:s1s2, regular rate and rhythm, tachycardia Abdomen:soft, mildly  tender to palpation of lower ABD, hypoactive bowel sounds Extremities:warm and dry, no edema noted  Neuro:alert, unable to assess full orientation [id number] in place, small amount amber urine seen in Foley bag  Resolved Hospital Problem list      Assessment & Plan:  Sever Sepsis / Septic Shock as evidence by rising lactic acid and signs of end organ damage including acute AKI and AMS, source unclear -in the setting of chronic right pleural effusion, worsening metastatic breast cancer with lung mets despite treatment, presence of port-a-cath and PleurX cath. Sepsis criteria as follows: Febrile with a T-max of 102.2, RR of 38, HR of 125, AMS, WBC 87.9, with suspected infection. LPattemptedin ED, unsuccessful, deferred re-attempt at LP for now . No visible signs of infection seen at line sites.  P: Will transition to full comfort measures once family has arrived Discontinue pressure support and antibiotic therapy  Continue IVF at [facility] Encephalopathy  -In the setting of sepsis and metastatic breast cancer. Head CT negative on admission. At baseline patient is completely alert and oriented.No nuchal rigidity on exam P: Primary focus will now be on comfort measures   Acute Kidney Injury  -in the setting of sepsis andorgan hypoperfusion, baseline creatinine 1.1-1.3 P: Comfort measures only  Keep foley for comfort   Metastatic Breast Cancer -Followed by Dr. Jana Rodgers. Status post right mastectomy, started on carboplatin and gemcitabine on [id number] with no complications last dose [id number]. 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 P: Patient is no longer candidate for chemotherapy Will initiate morphine drip for pain control and air hunger PRN ativan for anxiety Atropine drops for excessive oral secretions   Anemia -in the setting of metastatic disease, hgb 6.6 P: Comfort measures only    Paroxysmal SVT/hypotension  -likely due to sever sepsis and progressing metastatic breast cancer. 1x dose adenosine given overnight  followed by amiodarone drip, drip stopped due to hypotension  P: Discontinue pressure support  Discontinue bedside telemetry   Profound leukocytosis  -in the setting of metastatic breast cancer WBC count 87.9 on admission. Previous WBC 28.1 on 9/3 and 2.9 on 8/24.Patient received Neulasta on 05/19/2019 P: Comfort measures only   Best practice:  Diet:NPO Pain/Anxiety/Delirium protocol (if indicated):will begin Morphine drip and PRN ativan  VAP protocol (if indicated):N/A DVT prophylaxis:Subq hep GI prophylaxis:PPI Glucose control:N/A Mobility:BR Code Status:Full, verified with sister over the phone Family Communication:Daughter updated at bedside with decision to transition to comfor care only  Disposition: ICU  Labs   CBC: Recent Labs  Lab 06/24/2019 0448 06/02/2019 1227 06/05/19 0413 06/05/19 1658 06/05/19 2022  WBC 87.9*  --  73.9*  --   --   NEUTROABS 66.8*  --   --   --   --   HGB 8.9*  --  6.6* 9.9* 9.6*  HCT 29.6*  --  22.9* 33.6* 30.7*  MCV 76.9*  --  81.5  --   --   PLT 396 331 313  --   --     Basic Metabolic Panel: Recent Labs  Lab 05/27/2019 0448 06/03/2019 1522 06/05/19 0413 06/05/19 1656 06-30-2019 0338  NA 139  --  142 145 145  K 4.7  --  4.7 4.9 4.9  CL 98  --  113* 116* 115*  CO2 20*  --  15* 11* 9*  GLUCOSE 120*  --  149* 92 119*  BUN 39*  --  36* 39* 41*  CREATININE 2.66*  --  2.45* 2.74* 2.99*  CALCIUM 13.4*  --  9.8 10.1 9.2  MG  --   --  1.9  --   --   PHOS  --  3.2  --   --   --    GFR: Estimated Creatinine Clearance: 22.2 mL/min (A) (by C-G formula based on SCr of 2.99 mg/dL (H)). Recent Labs  Lab 06/18/2019 0448  06/19/2019 1229 06/14/2019 1231 06/15/2019 1522 05/27/2019 1827 06/05/19 0413 06/05/19 1656 07-04-19 0338  PROCALCITON  --   --  7.31  --   --   --  25.34  --  27.42  WBC 87.9*  --   --   --   --   --  73.9*  --   --   LATICACIDVEN 7.3*   < >  --  7.3* 5.8* 5.3*  --  6.2*  --    < > = values in this interval not  displayed.    Liver Function Tests: Recent Labs  Lab 05/30/2019 0448  AST 148*  ALT 107*  ALKPHOS 241*  BILITOT 0.9  PROT 6.7  ALBUMIN 3.0*   Recent Labs  Lab 06/03/2019 1229  LIPASE 20  AMYLASE 44   No results for input(s): AMMONIA in the last 168 hours.  ABG    Component Value Date/Time   PHART 7.232 (L) 06/05/2019 1435   PCO2ART 27.4 (L) 06/05/2019 1435   PO2ART 146 (H) 06/05/2019 1435   HCO3 11.1 (L) 06/05/2019 1435   TCO2 27 04/11/2019 2117   ACIDBASEDEF 14.9 (H) 06/05/2019 1435   O2SAT 97.9 06/05/2019 1435     Coagulation Profile: Recent Labs  Lab 06/03/2019 0448 06/12/2019 1227  INR 1.4* 1.5*    Cardiac Enzymes: Recent Labs  Lab 05/29/2019 1144  CKTOTAL 19*  CKMB 9.7*    HbA1C: Hgb A1c MFr Bld  Date/Time Value Ref Range Status  05/17/2010 09:50 PM  <5.7 % Final   5.4 (NOTE)                                                                       According to the ADA Clinical Practice Recommendations for 2011, when HbA1c is used as a screening test:   >=6.5%   Diagnostic of Diabetes Mellitus           (if abnormal result  is confirmed)  5.7-6.4%   Increased risk of developing Diabetes Mellitus  References:Diagnosis and Classification of Diabetes Mellitus,Diabetes [id number] 1):S62-S69 and Standards of Medical Care in         Diabetes - 2011,Diabetes [id number]  (Suppl 1):S11-S61.    CBG: Recent Labs  Lab 04-Jul-2019 0246  GLUCAP 77     Critical care time:     CRITICAL CARE Performed by: Johnsie Cancel   Total critical care time: 45 minutes  Critical care time was exclusive of separately billable procedures and treating other patients.  Critical care was necessary to treat or prevent imminent or life-threatening deterioration.  Critical care was time spent personally by me on the following activities: development of treatment plan with patient and/or surrogate as well as nursing, discussions with consultants, evaluation of  patient's  response to treatment, examination of patient, obtaining history from patient or surrogate, ordering and performing treatments and interventions, ordering and review of laboratory studies, ordering and review of radiographic studies, pulse oximetry and re-evaluation of patient's condition.  Johnsie Cancel, NP-C [facility] Pgr: [phone] or if no answer [phone] 06-14-2019

## 2019-06-26 DEATH — deceased

## 2019-07-02 ENCOUNTER — Other Ambulatory Visit: Payer: BC Managed Care – PPO

## 2019-07-02 ENCOUNTER — Ambulatory Visit: Payer: BC Managed Care – PPO | Admitting: Adult Health

## 2019-07-02 ENCOUNTER — Ambulatory Visit: Payer: BC Managed Care – PPO

## 2019-07-16 ENCOUNTER — Other Ambulatory Visit: Payer: BC Managed Care – PPO

## 2019-07-16 ENCOUNTER — Ambulatory Visit: Payer: BC Managed Care – PPO

## 2019-07-23 ENCOUNTER — Other Ambulatory Visit: Payer: BC Managed Care – PPO

## 2019-07-23 ENCOUNTER — Ambulatory Visit: Payer: BC Managed Care – PPO | Admitting: Adult Health

## 2019-07-23 ENCOUNTER — Ambulatory Visit: Payer: BC Managed Care – PPO

## 2020-01-11 IMAGING — CT CT CHEST W/O CM
2 of 4 series · 12 of 36 positions shown, 15 images · non-contrast
Comparison: CT scan of April 23, 2019.

CLINICAL DATA: Sepsis.

EXAM:
CT CHEST, ABDOMEN AND PELVIS WITHOUT CONTRAST
TECHNIQUE: Multidetector CT imaging of the chest, abdomen and pelvis was
performed following the standard protocol without IV contrast.

[Series 2: cap w/o · axial · non-contrast · 0.82mm/px · z∈[-606,-76]mm · 9 of 126 slices shown, 12 images]
[im 10/126  mediastinal]
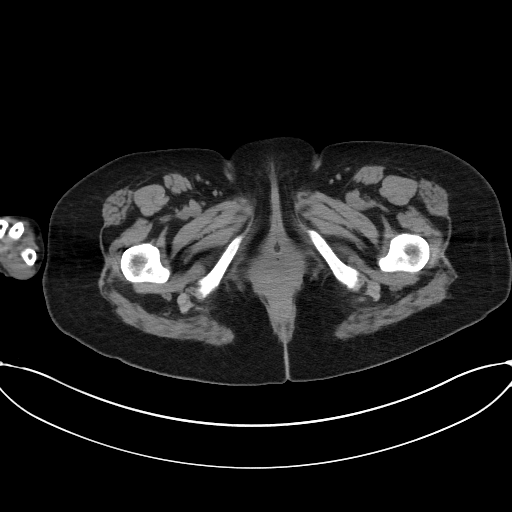
[im 10/126  lung]
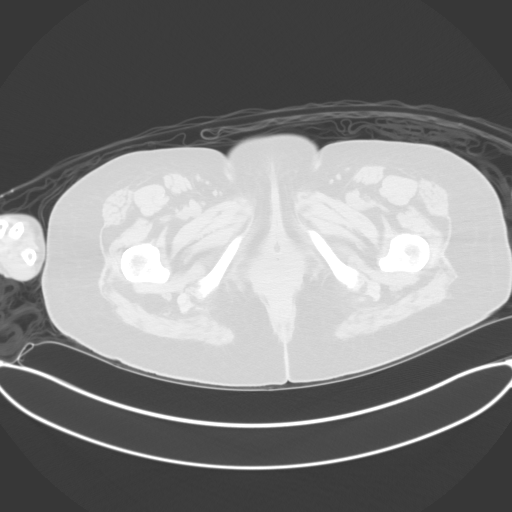
[im 29/126  lung]
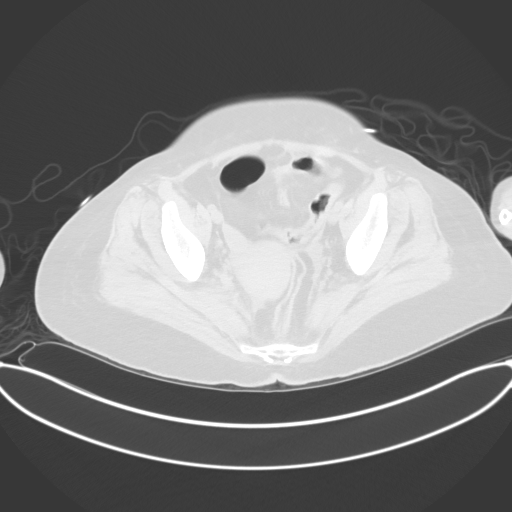
[im 39/126  lung]
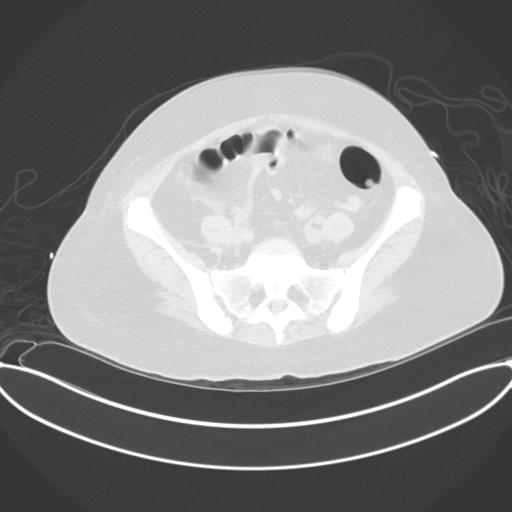
[im 49/126  lung]
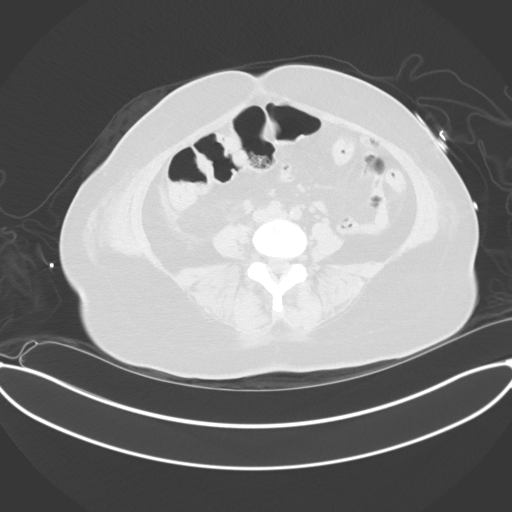
[im 68/126  mediastinal]
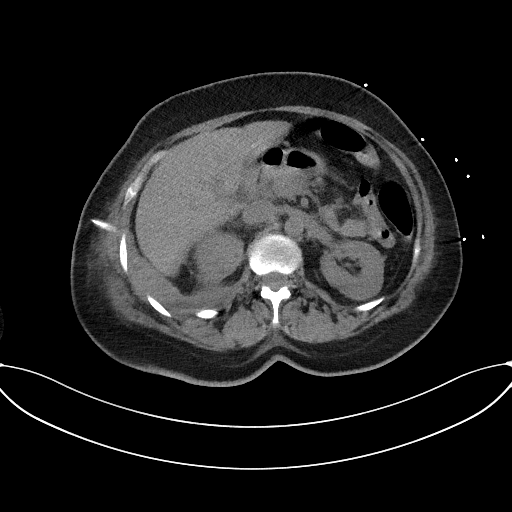
[im 68/126  lung]
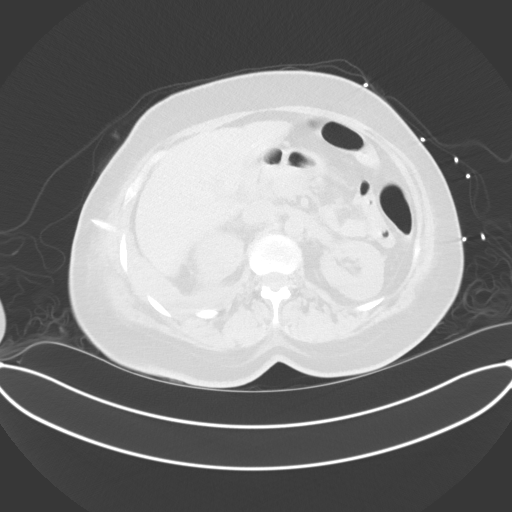
[im 77/126  lung]
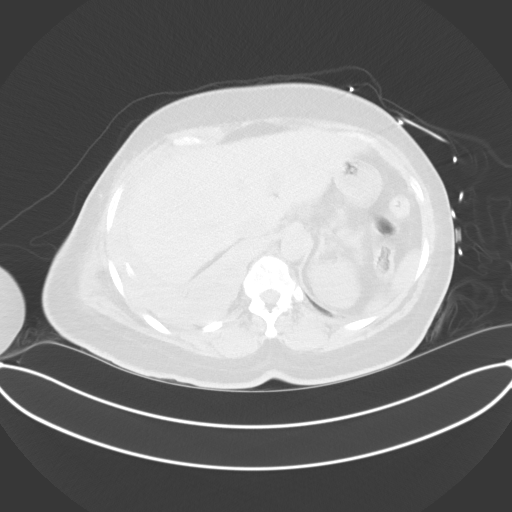
[im 87/126  lung]
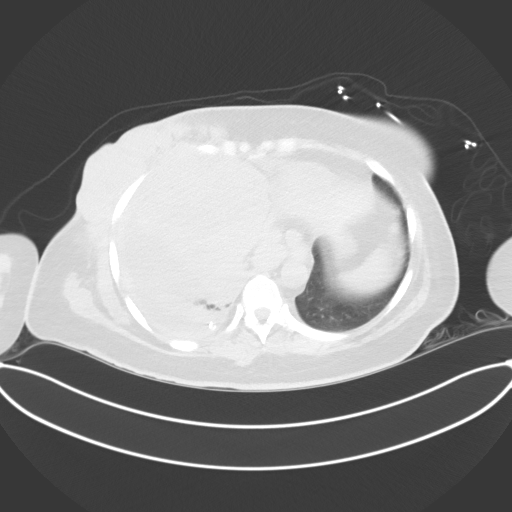
[im 106/126  lung]
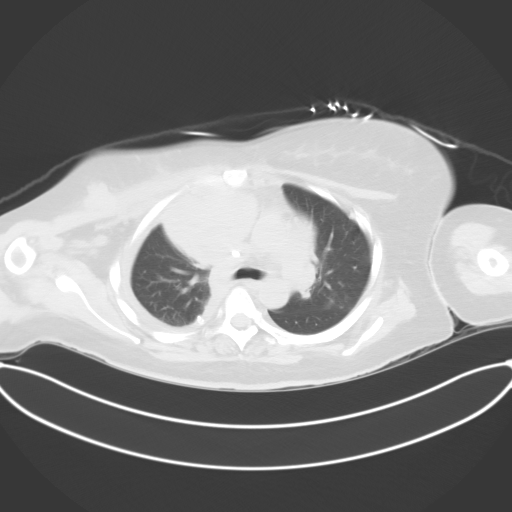
[im 116/126  mediastinal]
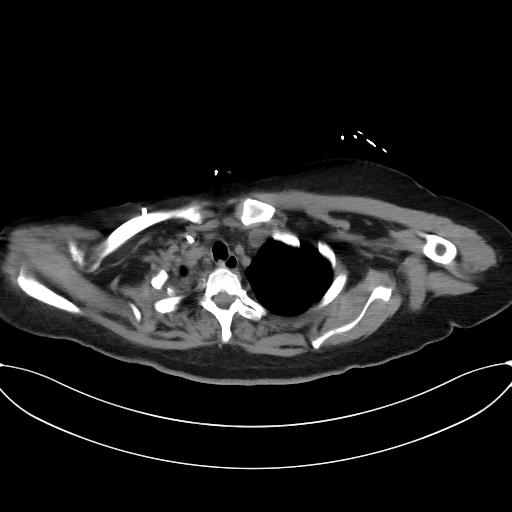
[im 116/126  lung]
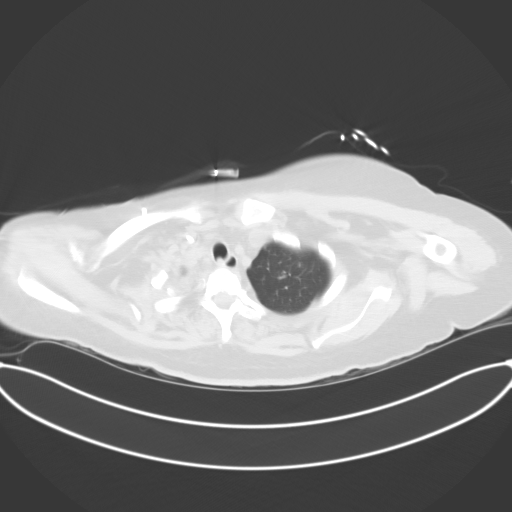

[Series 4: coronals · coronal · 0.71mm/px · 3 of 137 slices shown]
[im 28/137  lung]
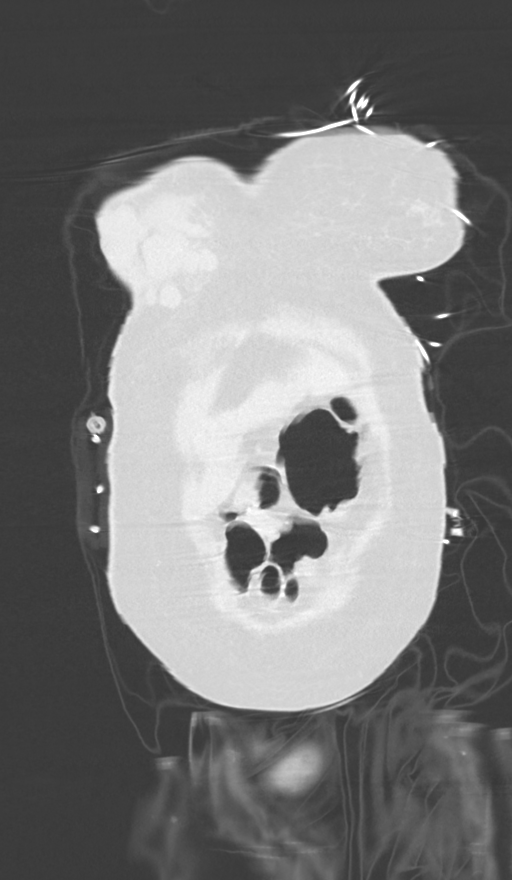
[im 55/137  lung]
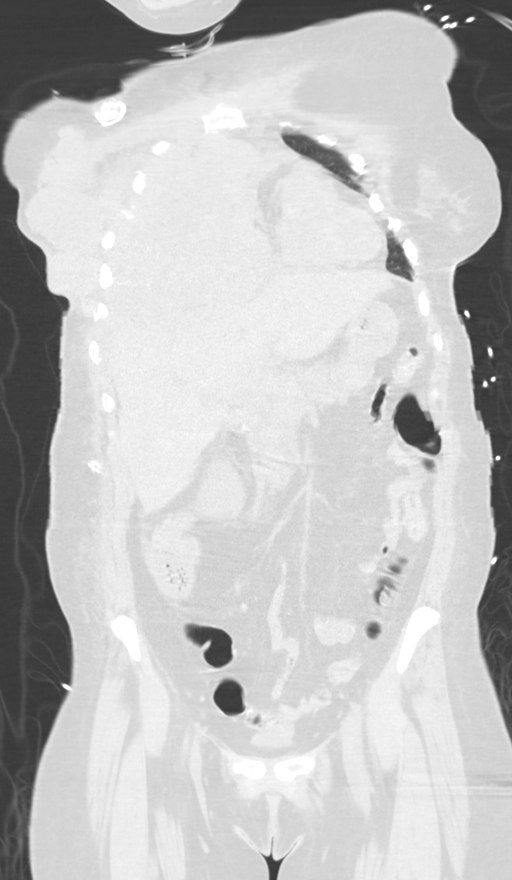
[im 82/137  lung]
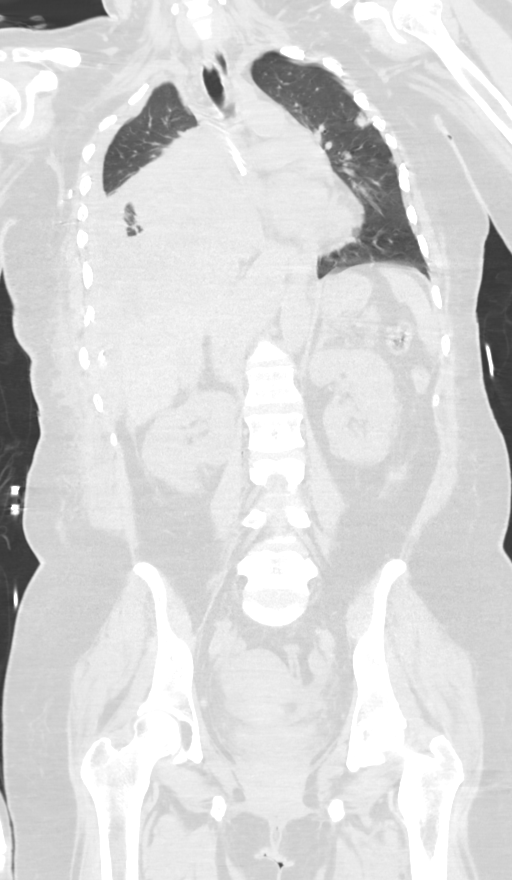

[12 of 36 positions shown; findings below may reference images not displayed]

FINDINGS: CT CHEST FINDINGS

Cardiovascular: There is no evidence of thoracic aortic aneurysm.
Normal cardiac size. No pericardial effusion is noted. Right
internal jugular Port-A-Cath is noted with tip at cavoatrial
junction.

Mediastinum/Nodes: Thyroid gland and esophagus are unremarkable.
Large soft tissue mass is seen in the base of the right hemithorax
which displaces the heart to the left and extends through the
anterior portions of several right ribs into the right breast
region. It is multilobulated in appearance and is consistent with
malignancy or metastatic disease. It is significantly enlarged
compared to prior exam. There is some lytic destruction of the ribs
that are involved.

Lungs/Pleura: Right-sided pleural drainage catheter is noted with
tip directed toward the right lung apex. Mild residual pleural
effusion is noted posteriorly in the right lung base with adjacent
atelectasis. Multiple pleural based masses are noted concerning for
metastatic disease. Atelectasis of the right lower lobe is noted. 8
mm left apical nodule is noted concerning for possible metastatic
disease. 7 mm subpleural nodule is noted laterally in the left upper
lobe concerning for metastatic disease. Several other smaller
nodules are noted. No pneumothorax is noted.

Musculoskeletal: As noted above, there appears to be lytic
destruction of the anterior portions of several right upper ribs due
to the previously described soft tissue mass crossing the chest
wall. No acute fracture is noted.

CT ABDOMEN PELVIS FINDINGS

Hepatobiliary: Grossly stable 2.7 cm possible liver mass is noted
posteriorly in the right hepatic lobe. No gallstones, gallbladder
wall thickening, or biliary dilatation.

Pancreas: Unremarkable. No pancreatic ductal dilatation or
surrounding inflammatory changes.

Spleen: Normal in size without focal abnormality.

Adrenals/Urinary Tract: Adrenal glands appear normal. Nonobstructive
right renal calculus is noted. No hydronephrosis or renal
obstruction is noted. Urinary bladder is decompressed secondary to
Foley catheter.

Stomach/Bowel: The stomach appears normal. There is no evidence of
bowel obstruction or inflammation. The appendix is not clearly
visualized.

Vascular/Lymphatic: No significant vascular findings are present. No
enlarged abdominal or pelvic lymph nodes.

Reproductive: Uterus and bilateral adnexa are unremarkable.

Other: No abdominal wall hernia or abnormality. No abdominopelvic
ascites.

Musculoskeletal: No acute or significant osseous findings.
IMPRESSION: Significant enlargement of mass extending from base of right
hemithorax through the right anterior chest wall and into the right
breast is noted, currently measuring 15 x 10 cm. This is consistent
with malignancy or metastatic disease. It results in lytic
destruction of the anterior portions of several right ribs.

Mild probable loculated right pleural effusion is noted with
adjacent subsegmental atelectasis. There does appear to be pleural
based masses or thickening in the base of the right hemithorax
concerning for metastatic disease. Right-sided chest tube is noted
in good position.

Multiple pulmonary nodules are noted in the left lung concerning for
metastatic disease.

Grossly stable 2.7 cm possible right hepatic mass is noted
posteriorly, concerning for metastatic disease.

Nonobstructive right renal calculus.
